# Patient Record
Sex: Male | Born: 1945 | Race: White | Hispanic: No | Marital: Single | Smoking: Former smoker
Health system: Southern US, Community
[De-identification: ages and names within clinical notes are randomized; demographics above are authoritative.]

## PROBLEM LIST (undated history)

## (undated) VITALS — BP 116/76 | HR 76 | Ht 73.0 in | Wt 260.0 lb

## (undated) DIAGNOSIS — I2510 Atherosclerotic heart disease of native coronary artery without angina pectoris: Secondary | ICD-10-CM

## (undated) DIAGNOSIS — I471 Supraventricular tachycardia, unspecified: Secondary | ICD-10-CM

## (undated) DIAGNOSIS — G4733 Obstructive sleep apnea (adult) (pediatric): Secondary | ICD-10-CM

## (undated) DIAGNOSIS — E119 Type 2 diabetes mellitus without complications: Secondary | ICD-10-CM

## (undated) DIAGNOSIS — I739 Peripheral vascular disease, unspecified: Secondary | ICD-10-CM

## (undated) DIAGNOSIS — C169 Malignant neoplasm of stomach, unspecified: Secondary | ICD-10-CM

## (undated) DIAGNOSIS — M109 Gout, unspecified: Secondary | ICD-10-CM

## (undated) DIAGNOSIS — Z95810 Presence of automatic (implantable) cardiac defibrillator: Secondary | ICD-10-CM

## (undated) DIAGNOSIS — G4730 Sleep apnea, unspecified: Secondary | ICD-10-CM

## (undated) DIAGNOSIS — I10 Essential (primary) hypertension: Secondary | ICD-10-CM

## (undated) DIAGNOSIS — T7840XA Allergy, unspecified, initial encounter: Secondary | ICD-10-CM

## (undated) DIAGNOSIS — Z8701 Personal history of pneumonia (recurrent): Secondary | ICD-10-CM

## (undated) DIAGNOSIS — J45909 Unspecified asthma, uncomplicated: Secondary | ICD-10-CM

## (undated) DIAGNOSIS — Z8719 Personal history of other diseases of the digestive system: Secondary | ICD-10-CM

## (undated) DIAGNOSIS — Z9289 Personal history of other medical treatment: Secondary | ICD-10-CM

## (undated) DIAGNOSIS — D649 Anemia, unspecified: Secondary | ICD-10-CM

## (undated) DIAGNOSIS — E669 Obesity, unspecified: Secondary | ICD-10-CM

## (undated) DIAGNOSIS — E785 Hyperlipidemia, unspecified: Secondary | ICD-10-CM

## (undated) DIAGNOSIS — IMO0001 Reserved for inherently not codable concepts without codable children: Secondary | ICD-10-CM

## (undated) DIAGNOSIS — F419 Anxiety disorder, unspecified: Secondary | ICD-10-CM

## (undated) DIAGNOSIS — Z9989 Dependence on other enabling machines and devices: Secondary | ICD-10-CM

## (undated) DIAGNOSIS — I472 Ventricular tachycardia, unspecified: Secondary | ICD-10-CM

## (undated) DIAGNOSIS — I4891 Unspecified atrial fibrillation: Secondary | ICD-10-CM

## (undated) DIAGNOSIS — Z5189 Encounter for other specified aftercare: Secondary | ICD-10-CM

## (undated) DIAGNOSIS — I509 Heart failure, unspecified: Secondary | ICD-10-CM

## (undated) DIAGNOSIS — K219 Gastro-esophageal reflux disease without esophagitis: Secondary | ICD-10-CM

## (undated) DIAGNOSIS — Z923 Personal history of irradiation: Secondary | ICD-10-CM

## (undated) HISTORY — DX: Essential (primary) hypertension: I10

## (undated) HISTORY — DX: Hyperlipidemia, unspecified: E78.5

## (undated) HISTORY — PX: TONSILLECTOMY: SUR1361

## (undated) HISTORY — DX: Ventricular tachycardia: I47.2

## (undated) HISTORY — PX: WRIST FRACTURE SURGERY: SHX121

## (undated) HISTORY — PX: CORONARY ARTERY BYPASS GRAFT: SHX141

## (undated) HISTORY — DX: Supraventricular tachycardia, unspecified: I47.10

## (undated) HISTORY — DX: Heart failure, unspecified: I50.9

## (undated) HISTORY — PX: CARDIAC CATHETERIZATION: SHX172

## (undated) HISTORY — PX: CATARACT EXTRACTION W/ INTRAOCULAR LENS IMPLANT: SHX1309

## (undated) HISTORY — DX: Ventricular tachycardia, unspecified: I47.20

## (undated) HISTORY — DX: Atherosclerotic heart disease of native coronary artery without angina pectoris: I25.10

## (undated) HISTORY — DX: Peripheral vascular disease, unspecified: I73.9

## (undated) HISTORY — PX: CARDIAC DEFIBRILLATOR PLACEMENT: SHX171

## (undated) HISTORY — DX: Obesity, unspecified: E66.9

## (undated) HISTORY — DX: Supraventricular tachycardia: I47.1

---

## 2000-12-19 ENCOUNTER — Ambulatory Visit (HOSPITAL_COMMUNITY): Admission: RE | Admit: 2000-12-19 | Discharge: 2000-12-19 | Payer: Self-pay | Admitting: Cardiology

## 2001-08-23 ENCOUNTER — Emergency Department (HOSPITAL_COMMUNITY): Admission: EM | Admit: 2001-08-23 | Discharge: 2001-08-23 | Payer: Self-pay | Admitting: Emergency Medicine

## 2001-08-23 ENCOUNTER — Encounter: Payer: Self-pay | Admitting: Cardiology

## 2002-11-15 ENCOUNTER — Encounter: Payer: Self-pay | Admitting: Internal Medicine

## 2002-11-15 ENCOUNTER — Ambulatory Visit (HOSPITAL_COMMUNITY): Admission: RE | Admit: 2002-11-15 | Discharge: 2002-11-15 | Payer: Self-pay | Admitting: Internal Medicine

## 2006-11-28 ENCOUNTER — Encounter (HOSPITAL_COMMUNITY): Admission: RE | Admit: 2006-11-28 | Discharge: 2007-02-26 | Payer: Self-pay | Admitting: Endocrinology

## 2006-12-08 ENCOUNTER — Ambulatory Visit (HOSPITAL_COMMUNITY): Admission: RE | Admit: 2006-12-08 | Discharge: 2006-12-08 | Payer: Self-pay | Admitting: Cardiology

## 2007-01-02 ENCOUNTER — Inpatient Hospital Stay (HOSPITAL_COMMUNITY): Admission: RE | Admit: 2007-01-02 | Discharge: 2007-01-07 | Payer: Self-pay | Admitting: Cardiothoracic Surgery

## 2007-01-02 ENCOUNTER — Ambulatory Visit: Payer: Self-pay | Admitting: Critical Care Medicine

## 2007-01-21 ENCOUNTER — Inpatient Hospital Stay (HOSPITAL_COMMUNITY): Admission: EM | Admit: 2007-01-21 | Discharge: 2007-01-24 | Payer: Self-pay | Admitting: Emergency Medicine

## 2007-01-23 ENCOUNTER — Encounter: Payer: Self-pay | Admitting: Cardiology

## 2007-02-09 ENCOUNTER — Ambulatory Visit: Payer: Self-pay | Admitting: Cardiothoracic Surgery

## 2007-02-27 ENCOUNTER — Ambulatory Visit: Payer: Self-pay | Admitting: Cardiothoracic Surgery

## 2007-02-27 ENCOUNTER — Encounter: Admission: RE | Admit: 2007-02-27 | Discharge: 2007-02-27 | Payer: Self-pay | Admitting: Cardiothoracic Surgery

## 2007-02-28 ENCOUNTER — Ambulatory Visit (HOSPITAL_COMMUNITY): Admission: RE | Admit: 2007-02-28 | Discharge: 2007-02-28 | Payer: Self-pay | Admitting: Cardiothoracic Surgery

## 2007-03-09 ENCOUNTER — Ambulatory Visit: Payer: Self-pay | Admitting: Cardiothoracic Surgery

## 2007-03-09 ENCOUNTER — Encounter: Admission: RE | Admit: 2007-03-09 | Discharge: 2007-03-09 | Payer: Self-pay | Admitting: Cardiothoracic Surgery

## 2007-03-30 ENCOUNTER — Encounter: Admission: RE | Admit: 2007-03-30 | Discharge: 2007-03-30 | Payer: Self-pay | Admitting: Cardiothoracic Surgery

## 2007-03-30 ENCOUNTER — Ambulatory Visit: Payer: Self-pay | Admitting: Cardiothoracic Surgery

## 2007-08-21 ENCOUNTER — Emergency Department (HOSPITAL_COMMUNITY): Admission: EM | Admit: 2007-08-21 | Discharge: 2007-08-22 | Payer: Self-pay | Admitting: Emergency Medicine

## 2007-09-14 ENCOUNTER — Encounter: Payer: Self-pay | Admitting: Pulmonary Disease

## 2007-11-22 ENCOUNTER — Ambulatory Visit (HOSPITAL_COMMUNITY): Admission: RE | Admit: 2007-11-22 | Discharge: 2007-11-22 | Payer: Self-pay | Admitting: Cardiology

## 2007-11-27 ENCOUNTER — Inpatient Hospital Stay (HOSPITAL_COMMUNITY): Admission: RE | Admit: 2007-11-27 | Discharge: 2007-11-28 | Payer: Self-pay | Admitting: Cardiology

## 2007-12-19 ENCOUNTER — Ambulatory Visit: Payer: Self-pay | Admitting: Pulmonary Disease

## 2007-12-19 DIAGNOSIS — J45909 Unspecified asthma, uncomplicated: Secondary | ICD-10-CM | POA: Insufficient documentation

## 2007-12-19 DIAGNOSIS — I10 Essential (primary) hypertension: Secondary | ICD-10-CM

## 2007-12-19 DIAGNOSIS — E119 Type 2 diabetes mellitus without complications: Secondary | ICD-10-CM | POA: Insufficient documentation

## 2007-12-19 DIAGNOSIS — R0602 Shortness of breath: Secondary | ICD-10-CM | POA: Insufficient documentation

## 2007-12-19 DIAGNOSIS — R0600 Dyspnea, unspecified: Secondary | ICD-10-CM

## 2007-12-19 DIAGNOSIS — E785 Hyperlipidemia, unspecified: Secondary | ICD-10-CM

## 2007-12-20 ENCOUNTER — Ambulatory Visit: Payer: Self-pay | Admitting: Pulmonary Disease

## 2007-12-25 ENCOUNTER — Ambulatory Visit: Payer: Self-pay | Admitting: Pulmonary Disease

## 2007-12-25 DIAGNOSIS — J438 Other emphysema: Secondary | ICD-10-CM | POA: Insufficient documentation

## 2008-01-01 ENCOUNTER — Ambulatory Visit (HOSPITAL_COMMUNITY): Admission: RE | Admit: 2008-01-01 | Discharge: 2008-01-01 | Payer: Self-pay | Admitting: Pulmonary Disease

## 2008-01-07 ENCOUNTER — Telehealth (INDEPENDENT_AMBULATORY_CARE_PROVIDER_SITE_OTHER): Payer: Self-pay | Admitting: *Deleted

## 2008-02-29 ENCOUNTER — Ambulatory Visit: Payer: Self-pay | Admitting: Pulmonary Disease

## 2008-03-28 ENCOUNTER — Ambulatory Visit: Payer: Self-pay | Admitting: Pulmonary Disease

## 2008-11-26 ENCOUNTER — Emergency Department (HOSPITAL_COMMUNITY): Admission: EM | Admit: 2008-11-26 | Discharge: 2008-11-26 | Payer: Self-pay | Admitting: Emergency Medicine

## 2010-07-21 ENCOUNTER — Ambulatory Visit: Payer: Self-pay | Admitting: Cardiology

## 2010-07-26 ENCOUNTER — Ambulatory Visit: Payer: Self-pay | Admitting: Cardiology

## 2010-10-12 ENCOUNTER — Ambulatory Visit: Payer: Self-pay | Admitting: Internal Medicine

## 2010-10-12 DIAGNOSIS — I472 Ventricular tachycardia: Secondary | ICD-10-CM

## 2010-10-14 ENCOUNTER — Ambulatory Visit: Payer: Self-pay | Admitting: Internal Medicine

## 2010-10-25 ENCOUNTER — Encounter: Payer: Self-pay | Admitting: Internal Medicine

## 2010-10-25 ENCOUNTER — Encounter (INDEPENDENT_AMBULATORY_CARE_PROVIDER_SITE_OTHER): Payer: Self-pay | Admitting: *Deleted

## 2010-10-27 ENCOUNTER — Ambulatory Visit: Payer: Self-pay | Admitting: Cardiology

## 2010-11-09 ENCOUNTER — Telehealth (INDEPENDENT_AMBULATORY_CARE_PROVIDER_SITE_OTHER): Payer: Self-pay | Admitting: *Deleted

## 2010-11-10 ENCOUNTER — Encounter (HOSPITAL_COMMUNITY)
Admission: RE | Admit: 2010-11-10 | Discharge: 2011-01-11 | Payer: Self-pay | Source: Home / Self Care | Attending: Cardiology | Admitting: Cardiology

## 2010-11-10 ENCOUNTER — Encounter: Payer: Self-pay | Admitting: Cardiology

## 2010-11-10 ENCOUNTER — Ambulatory Visit: Payer: Self-pay | Admitting: Cardiology

## 2010-11-10 ENCOUNTER — Ambulatory Visit: Payer: Self-pay

## 2010-11-10 ENCOUNTER — Ambulatory Visit (HOSPITAL_COMMUNITY)
Admission: RE | Admit: 2010-11-10 | Discharge: 2010-11-10 | Payer: Self-pay | Source: Home / Self Care | Admitting: Cardiology

## 2010-11-18 ENCOUNTER — Telehealth: Payer: Self-pay | Admitting: Internal Medicine

## 2010-12-03 ENCOUNTER — Ambulatory Visit: Payer: Self-pay | Admitting: Pulmonary Disease

## 2010-12-03 DIAGNOSIS — G4733 Obstructive sleep apnea (adult) (pediatric): Secondary | ICD-10-CM

## 2010-12-27 ENCOUNTER — Ambulatory Visit: Payer: Self-pay | Admitting: Cardiology

## 2010-12-28 ENCOUNTER — Ambulatory Visit (HOSPITAL_BASED_OUTPATIENT_CLINIC_OR_DEPARTMENT_OTHER)
Admission: RE | Admit: 2010-12-28 | Discharge: 2010-12-28 | Payer: Self-pay | Source: Home / Self Care | Attending: Pulmonary Disease | Admitting: Pulmonary Disease

## 2010-12-28 ENCOUNTER — Encounter: Payer: Self-pay | Admitting: Pulmonary Disease

## 2011-01-02 ENCOUNTER — Encounter: Payer: Self-pay | Admitting: Cardiothoracic Surgery

## 2011-01-11 DIAGNOSIS — G4733 Obstructive sleep apnea (adult) (pediatric): Secondary | ICD-10-CM

## 2011-01-13 ENCOUNTER — Ambulatory Visit: Admit: 2011-01-13 | Payer: Self-pay | Admitting: Internal Medicine

## 2011-01-13 ENCOUNTER — Encounter (INDEPENDENT_AMBULATORY_CARE_PROVIDER_SITE_OTHER): Payer: Medicare Other

## 2011-01-13 ENCOUNTER — Encounter: Payer: Self-pay | Admitting: Internal Medicine

## 2011-01-13 DIAGNOSIS — I428 Other cardiomyopathies: Secondary | ICD-10-CM

## 2011-01-13 NOTE — Assessment & Plan Note (Signed)
Summary: Cardiology Nuclear Testing  Nuclear Med Background Indications for Stress Test: Evaluation for Ischemia, Graft Patency, PTCA Patency   History: Angioplasty, Asthma, CABG, COPD, Defibrillator, Echo, Emphysema, Heart Catheterization, Myocardial Perfusion Study  History Comments: '97 CABG with re-do 2007 after abnormal ZOX:WRUEAV scar with peri-infarct ischemia; '08 PTCA-LIMA-LAD; h/o SVT  Symptoms: Chest Pain, Diaphoresis, DOE, Fatigue, Light-Headedness  Symptoms Comments: Hx of SVT   Nuclear Pre-Procedure Cardiac Risk Factors: History of Smoking, Hypertension, IDDM Type 1, Lipids, Obesity Caffeine/Decaff Intake: None NPO After: 6:00 PM Lungs: Audible wheezes with walking, clear with rest.  O2 Sat 100% on RA IV 0.9% NS with Angio Cath: 22g     IV Site: R Hand IV Started by: Bonnita Levan, RN Chest Size (in) 50     Height (in): 73 Weight (lb): 256 BMI: 33.90  Nuclear Med Study 1 or 2 day study:  1 day     Stress Test Type:  Eugenie Birks Reading MD:  Marca Ancona, MD     Referring MD:  Peter [place], MD Resting Radionuclide:  Technetium 39m Tetrofosmin     Resting Radionuclide Dose:  11 mCi  Stress Radionuclide:  Technetium 1m Tetrofosmin     Stress Radionuclide Dose:  33 mCi   Stress Protocol   Lexiscan: 0.4 mg   Stress Test Technologist:  Rea College, CMA-N     Nuclear Technologist:  Domenic Polite, CNMT  Rest Procedure  Myocardial perfusion imaging was performed at rest 45 minutes following the intravenous administration of Technetium 66m Tetrofosmin.  Stress Procedure  The patient received IV Lexiscan 0.4 mg over 15-seconds.  Technetium 9m Tetrofosmin injected at 30-seconds.  There were no significant changes with infusion, other than occasional PVC's.  Quantitative spect images were obtained after a 45 minute delay.  QPS Raw Data Images:  Normal; no motion artifact; normal heart/lung ratio. Stress Images:  Inferolateral perfusion defect extending to the apex.    Rest Images:  Inferolateral perfusion defect extending to the apex.  Subtraction (SDS):  Primarily fixed inferolateral perfusion defect extending to the apex with slight reversibility.  Transient Ischemic Dilatation:  1.10  (Normal <1.22)  Lung/Heart Ratio:  0.41  (Normal <0.45)  Quantitative Gated Spect Images QGS EDV:  169 ml QGS ESV:  97 ml QGS EF:  42 % QGS cine images:  Inferolateral hypokinesis.    Overall Impression  Exercise Capacity: Lexiscan with no exercise. BP Response: Normal blood pressure response. Clinical Symptoms: Short of breath.  ECG Impression: No significant ST segment change suggestive of ischemia. Overall Impression: Primarily fixed inferolateral defect extending to the apex.  Defect is most profound at the apex.  Overall Impression Comments: Suggestive of prior infarct with some mild peri-infarct ischemia.  Mild systolic dysfunction with inferolateral hypokinesis.   Appended Document: Cardiology Nuclear Testing copy sent to Dr. Swaz[place]and   Appended Document: Cardiology Nuclear Testing this appears to be new from 2007 in terms of inferolaeral defect;  the lvEF is much improved   Appended Document: Cardiology Nuclear Testing spoke with Dr [place]; he will follow up with patient  steve  Appended Document: Cardiology Nuclear Testing pt aware of info. Claris Gladden, RN, BSN

## 2011-01-13 NOTE — Assessment & Plan Note (Signed)
Summary: FATIGUE/ SLEEP DISORDER ///KP   Visit Type:  Initial Consult Copy to:  Dr. Sherryl Manges Primary Provider/Referring Provider:  Dr. Carlis Stable in [facility]  CC:  Sleep consult...epworth score is 6..  History of Present Illness: 65 yo male for sleep evaluation.  He has a history of heart disease, diabetes, and heart failure.  He was recently seen by cardiology, and there was concern that he could have sleep disordered breathing contributing to his health problems.  Sleep consultation was therefore requested.  He lives alone.  He has been told he snores.  He goes to bed at 11pm, and falls asleep quickly.  He wakes upt to use the bathroom 2 or 3 times.  He gets out of bed at 8am.  He feels okay in the morning, and denies headaches.  He does not use anything to help him sleep.  He drinks diet pepsi all day.  He will start to doze off while watching TV.  He occasionally takes naps during the day.  He denies sleep walking, sleep talking, nightmares, or bruxism.  There is no history of restless legs.  He denies sleep hallucinations, sleep paralysis, or cataplexy.  His weight has been steady.  There is no history of thyroid disease.  He had his tonsills out as a child.  He quit smoking in 1997.  He denies alcohol use.  There is no history of depression.  He has never had a sleep test before.  Preventive Screening-Counseling & Management  Alcohol-Tobacco     Alcohol drinks/day: 0     Smoking Status: quit     Packs/Day: 1.0     Year Started: 1967     Year Quit: 1997     Pack years: 30  Current Medications (verified): 1)  Fish Oil 1200 Mg  Caps (Omega-3 Fatty Acids) .... Take 4 Tablet By Mouth Once A Day 2)  Bayer Low Strength 81 Mg  Tbec (Aspirin) .... Take 1 Tablet By Mouth Once A Day 3)  Albuterol 90 Mcg/act  Aers (Albuterol) .... Inhale 2 Puffs Every Four Hours As Needed 4)  Potassium .... (Unknown Dosage) Take 1 Tablet By Mouth Once A Day 5)  Lipitor 40 Mg  Tabs (Atorvastatin  Calcium) .... Take 1 Tablet By Mouth Once A Day 6)  Metoprolol Succinate 100 Mg  Tb24 (Metoprolol Succinate) .... Take 1 Tablet By Mouth Once A Day 7)  Benicar 20 Mg  Tabs (Olmesartan Medoxomil) .... Take 1 Tablet By Mouth Once A Day 8)  Allopurinol 300 Mg Tabs (Allopurinol) .... Once Daily 9)  Ranitidine Hcl 300 Mg Caps (Ranitidine Hcl) .... Once Daily 10)  Januvia 100 Mg Tabs (Sitagliptin Phosphate) .... Once Daily 11)  Crestor 20 Mg Tabs (Rosuvastatin Calcium) .... Take One Tablet By Mouth Daily. 12)  Fenofibrate 160 Mg Tabs (Fenofibrate) .... Take One Tablet By Mouth Daily With A Meal 13)  Glucotrol 10 Mg Tabs (Glipizide) .... Once Daily 14)  Levemir Flexpen 100 Unit/ml Soln (Insulin Detemir) .... As Directed  Allergies (verified): No Known Drug Allergies  Past History:  Past Medical History: Last updated: 12/19/2007 Asthma Hyperlipidemia Hypertension Coronary Heart Disease s/p recent stent 12/08 ischemic cm with chronic chf h/o svt/flutter h/o vt with icd placement Diabetes, Type 2  Past Surgical History: C A B G: times two with last 1/08 Tonsillectomy  Family History: heart disease: brother cancer: brother( unsure what type), mother (breast)  Social History: Patient states former smoker.  pt is single. he has worked  as a Advice worker, now retired Alcohol drinks/day:  0 Packs/Day:  1.0 Pack years:  30  Review of Systems       The patient complains of anxiety.  The patient denies shortness of breath with activity, shortness of breath at rest, productive cough, non-productive cough, coughing up blood, chest pain, irregular heartbeats, acid heartburn, indigestion, loss of appetite, weight change, abdominal pain, difficulty swallowing, sore throat, tooth/dental problems, headaches, nasal congestion/difficulty breathing through nose, sneezing, itching, ear ache, depression, hand/feet swelling, joint stiffness or pain, rash, change in color of mucus, and fever.     Vital Signs:  Patient profile:   65 year old male Height:      73 inches (185.42 cm) Weight:      260 pounds (118.18 kg) BMI:     34.43 O2 Sat:      97 % on Room air Temp:     98.0 degrees F (36.67 degrees C) oral Pulse rate:   76 / minute BP sitting:   116 / 76  (left arm) Cuff size:   large  Vitals Entered By: Michel Bickers CMA (December 03, 2010 1:24 PM)  O2 Sat at Rest %:  97 O2 Flow:  Room air CC: Sleep consult...epworth score is 6. Is Patient Diabetic? Yes Comments Medications reviewed with patient Michel Bickers [facility]  December 03, 2010 1:24 PM   Physical Exam  General:  normal appearance, healthy appearing, and obese.   Eyes:  pupils equalround reactive to light and accommodation, however he does have lateral deviation of his right eye.  Nose:  no deformity, discharge, inflammation, or lesions Mouth:  MP 3, no exudate Neck:  no JVD, thyromegaly, or lymphadenopathy. Lungs:  clear bilaterally to auscultation and percussion Heart:  regular rhythm, normal rate, and no murmurs.   Abdomen:  bowel sounds positive; abdomen soft and non-tender without masses, or organomegaly Pulses:  pulses normal Extremities:  no clubbing, cyanosis, edema, or deformity noted Neurologic:  normal CN II-XII and strength normal.   Cervical Nodes:  no significant adenopathy Psych:  alert and cooperative; normal mood and affect; normal attention span and concentration   Impression & Recommendations:  Problem # 1:  HYPERSOMNIA (ICD-780.54) He has snoring, sleep disruption, and daytimes sleepiness.  He has a history of CAD, heart failure, arrhythmia, and diabetes.  I am concerned he could have sleep apnea.  To further assess this will arrange for in-lab sleep test.  Explained how sleep apnea can affect his health.  Driving precautions and importance of weight loss were discussed.  Complete Medication List: 1)  Fish Oil 1200 Mg Caps (Omega-3 fatty acids) .... Take 4 tablet by mouth once a day 2)   Bayer Low Strength 81 Mg Tbec (Aspirin) .... Take 1 tablet by mouth once a day 3)  Albuterol 90 Mcg/act Aers (Albuterol) .... Inhale 2 puffs every four hours as needed 4)  Potassium  .... (unknown dosage) take 1 tablet by mouth once a day 5)  Lipitor 40 Mg Tabs (Atorvastatin calcium) .... Take 1 tablet by mouth once a day 6)  Metoprolol Succinate 100 Mg Tb24 (Metoprolol succinate) .... Take 1 tablet by mouth once a day 7)  Benicar 20 Mg Tabs (Olmesartan medoxomil) .... Take 1 tablet by mouth once a day 8)  Allopurinol 300 Mg Tabs (Allopurinol) .... Once daily 9)  Ranitidine Hcl 300 Mg Caps (Ranitidine hcl) .... Once daily 10)  Januvia 100 Mg Tabs (Sitagliptin phosphate) .... Once daily 11)  Crestor 20 Mg Tabs (  Rosuvastatin calcium) .... Take one tablet by mouth daily. 12)  Fenofibrate 160 Mg Tabs (Fenofibrate) .... Take one tablet by mouth daily with a meal 13)  Glucotrol 10 Mg Tabs (Glipizide) .... Once daily 14)  Levemir Flexpen 100 Unit/ml Soln (Insulin detemir) .... As directed  Other Orders: Consultation Level IV (52841) Sleep Disorder Referral (Sleep Disorder)  Patient Instructions: 1)  Will schedule sleep test 2)  Will call to schedule follow up after sleep test reviewed

## 2011-01-13 NOTE — Assessment & Plan Note (Signed)
Summary: device ck-battery low/mt   Visit Type:  [employer] Referring Provider:  [place]  CC:  shortness of breath, dizziness, and chest pain.  History of Present Illness: [name] is seen in followup for polymorphic ventricular tachycardia and ischemic heart disease with prior bypass surgery and subsequent inducible ventricular tachycardia following revascularization. He is status post ICD implantation.  He underwent redo CABG in 2007 and then subsequent angioplasty of the IMA anastomosis to the LAD in 2008. Ejection fraction at that time was 50%.  He has chronic modest dyspnea on exertion. He has chronic stable fatigue with sleep disordered breathing and daytime somnolence. He also has atypical sharp chest pains that are related to stress and mildly to exertion but not reproducibly so.  Problems Prior to Update: 1)  Emphysema  (ICD-492.8) 2)  Diabetes, Type 2  (ICD-250.00) 3)  Coronary Heart Disease  (ICD-414.00) 4)  Dyspnea  (ICD-786.05) 5)  Dm  (ICD-250.00) 6)  Heart Failure  (ICD-428.9) 7)  Hypertension  (ICD-401.9) 8)  Hyperlipidemia  (ICD-272.4) 9)  Asthma  (ICD-493.90)  Current Medications (verified): 1)  Fish Oil 1200 Mg  Caps (Omega-3 Fatty Acids) .... Take 4 Tablet By Mouth Once A Day 2)  Bayer Low Strength 81 Mg  Tbec (Aspirin) .... Take 1 Tablet By Mouth Once A Day 3)  Albuterol 90 Mcg/act  Aers (Albuterol) .... Inhale 2 Puffs Every Four Hours As Needed 4)  Potassium .... (Unknown Dosage) Take 1 Tablet By Mouth Once A Day 5)  Lipitor 40 Mg  Tabs (Atorvastatin Calcium) .... Take 1 Tablet By Mouth Once A Day 6)  Metoprolol Succinate 100 Mg  Tb24 (Metoprolol Succinate) .... Take 1 Tablet By Mouth Once A Day 7)  Benicar 20 Mg  Tabs (Olmesartan Medoxomil) .... Take 1 Tablet By Mouth Once A Day 8)  Allopurinol 300 Mg Tabs (Allopurinol) .... Once Daily 9)  Ranitidine Hcl 300 Mg Caps (Ranitidine Hcl) .... Once Daily 10)  Januvia 100 Mg Tabs (Sitagliptin Phosphate) .... Once  Daily 11)  Crestor 20 Mg Tabs (Rosuvastatin Calcium) .... Take One Tablet By Mouth Daily. 12)  Fenofibrate 160 Mg Tabs (Fenofibrate) .... Take One Tablet By Mouth Daily With A Meal 13)  Glucotrol 10 Mg Tabs (Glipizide) .... Once Daily 14)  Levemir Flexpen 100 Unit/ml Soln (Insulin Detemir) .... As Directed  Allergies (verified): No Known Drug Allergies  Past History:  Past Medical History: Last updated: 12/19/2007 Asthma Hyperlipidemia Hypertension Coronary Heart Disease s/p recent stent 12/08 ischemic cm with chronic chf h/o svt/flutter h/o vt with icd placement Diabetes, Type 2  Past Surgical History: Last updated: 12/19/2007 C A B G: times two with last 1/08  Family History: Last updated: 12/19/2007 heart disease: brother cancer: brother( unsure what type), mother (breast)  Social History: Last updated: 12/19/2007 Patient states former smoker.  pt is single. he has worked as a Advice worker  Risk Factors: Smoking Status: quit (12/19/2007)  Vital Signs:  Patient profile:   65 year old male Height:      73 inches Weight:      256 pounds BMI:     33.90 Pulse rate:   91 / minute BP sitting:   118 / 75  (left arm) Cuff size:   regular  Vitals Entered By: Caralee Ates CMA (October 12, 2010 12:27 PM)  Physical Exam  General:  The patient was alert and oriented in no acute distress. somewhat plethoric facies HEENT Normal.  Neck veins were flat, carotids were brisk.  Lungs were clear.  Heart sounds were regular without murmurs or gallops.  Abdomen was soft with active bowel sounds. There is no clubbing cyanosis or edema. Skin Warm and dry    EKG  Procedure date:  10/12/2010  Findings:      sinus rhythm at 89 Intervals 0.22/0.10/0.37 Axis is 21 Poor R wave progression   ICD Specifications Following MD:  Sherryl Manges, MD     Referring MD:  Vonna Drafts ICD Vendor:  [employer]     ICD Model Number:  7232     ICD Serial Number:  [id number] ICD  DOI:  11/15/2002     ICD Implanting MD:  Charlynn Court  Lead 1:    Location: RV     DOI: 04/12/1996     Model #: [id number]     Serial #: [id number]     Status: active  ICD Follow Up Remote Check?  No Battery Voltage:  2.65 V     Charge Time:  10.76 seconds     Underlying rhythm:  SR   ICD Device Measurements Right Ventricle:  Amplitude: 17.2 mV, Impedance: 432 ohms, Threshold: 1.0 V at 0.3 msec Shock Impedance: 61 ohms   Episodes Coumadin:  No Shock:  0     ATP:  0     Nonsustained:  0     Ventricular Pacing:  <0.1%  Brady Parameters Mode VVI     Lower Rate Limit:  40      Tachy Zones VF:  214     VT1:  167     Next Remote Date:  01/13/2011     Tech Comments:  No parameter changes.  Device function normal.  Battery near ERI.  Alert tones demonstrated.  Carelink transmissions every 3 months.  ROV 1 year with Dr. Graciela Husbands. Altha Harm, LPN  October 12, 2010 12:21 PM n  Impression & Recommendations:  Problem # 1:  FATIGUE-SLEEP-DISORDERED BREATHING (ICD-780.79)  the patient has significant daytime fatigue and nocturnal sleep disordered breathing. We'll arrange for a night watch study  Problem # 2:  IMPLANTABLE  DEFIBRILLATOR -MDT (ICD-V45.02) Device parameters and data were reviewed and no changes were made Device is approaching ERI with a battery voltage of 2.65. He is followed by care like  Problem # 3:  VENTRICULAR TACHYCARDIA (ICD-427.1) no intercurrent ventricular tachycardia His updated medication list for this problem includes:    Bayer Low Strength 81 Mg Tbec (Aspirin) .[name]... Take 1 tablet by mouth once a day    Metoprolol Succinate 100 Mg Tb24 (Metoprolol succinate) .[name]... Take 1 tablet by mouth once a day  Problem # 4:  HEART FAILURE (ICD-428.9) He has modest combined congestive heart failure. we'll continue him on his current medications.  Problem # 5:  CORONARY HEART DISEASE S/P CABG AND REDO (ICD-414.00) stable on current medications His updated medication list for  this problem includes:    Bayer Low Strength 81 Mg Tbec (Aspirin) .[name]... Take 1 tablet by mouth once a day    Metoprolol Succinate 100 Mg Tb24 (Metoprolol succinate) .[name]... Take 1 tablet by mouth once a day  Other Orders: Misc. Referral (Misc. Ref)  Patient Instructions: 1)  Your physician has recommended that you wear a holter monitor-Night Watch.  Holter monitors are medical devices that record the heart's electrical activity. Doctors most often use these monitors to diagnose arrhythmias. Arrhythmias are problems with the speed or rhythm of the heartbeat. The monitor is a small, portable device. You can wear one while  you do your normal daily activities. This is usually used to diagnose what is causing palpitations/syncope (passing out). 2)  Your physician recommends that you continue on your current medications as directed. Please refer to the Current Medication list given to you today.

## 2011-01-13 NOTE — Progress Notes (Signed)
  Phone Note Outgoing Call   Call placed by: rhonda Call placed to: Patient Details for Reason: nightwatch study Summary of Call: adv pt that we will be referring him to Dr. Vassie Loll.  He expressed understanding.  Initial call taken by: Claris Gladden RN,  November 18, 2010 6:17 PM

## 2011-01-13 NOTE — Progress Notes (Signed)
Summary: nuc pre procedure  Phone Note Outgoing Call Call back at Home Phone [phone]   Call placed by: Cathlyn Parsons RN,  November 09, 2010 2:52 PM Call placed to: Patient Reason for Call: Confirm/change Appt Summary of Call: Reviewed information on [employer] Information Sheet (see scanned document for further details).  Spoke with patient.      Nuclear Med Background Indications for Stress Test: Evaluation for Ischemia, Graft Patency, PTCA Patency   History: Angioplasty, CABG, COPD, Defibrillator, Echo, Heart Catheterization, Myocardial Perfusion Study  History Comments: 07 Redo CABG and MPS with apical infar and EF=24% 08 PTCA of LAD and Echo and EF=35-40%  Symptoms: Chest Pain, DOE, Light-Headedness  Symptoms Comments: Hx of SVT   Nuclear Pre-Procedure Cardiac Risk Factors: Hypertension, IDDM Type 1, Lipids, Obesity Height (in): 73

## 2011-01-13 NOTE — Procedures (Signed)
Summary: nite watch  nite watch   Imported By: Mirna Mires 11/22/2010 16:20:59  _____________________________________________________________________  External Attachment:    Type:   Image     Comment:   External Document

## 2011-01-19 NOTE — Miscellaneous (Signed)
Summary: Sleep study report  Clinical Lists Changes AHI 13.3, RDI 26.3.  Positional and REM effect.  PLMI 0.  Frequent PVC's.  Will have my nurse schedule ROV to discuss results.  Appended Document: Sleep study report pt is scheduled to come in on 2/13 at 1:30 to discuss results

## 2011-01-21 ENCOUNTER — Encounter: Payer: Self-pay | Admitting: Pulmonary Disease

## 2011-01-21 ENCOUNTER — Ambulatory Visit (INDEPENDENT_AMBULATORY_CARE_PROVIDER_SITE_OTHER): Payer: Medicare Other | Admitting: Pulmonary Disease

## 2011-01-21 DIAGNOSIS — G4733 Obstructive sleep apnea (adult) (pediatric): Secondary | ICD-10-CM

## 2011-01-24 ENCOUNTER — Ambulatory Visit: Payer: Medicare Other | Admitting: Pulmonary Disease

## 2011-01-27 NOTE — Assessment & Plan Note (Addendum)
Summary: discuss sleep results/ms   Copy to:  Dr. Sherryl Manges Primary Provider/Referring Provider:  Dr. Carlis Stable in [facility]  CC:  pt here to discuss sleep study results..  History of Present Illness: 65 yo male with OSA.  Sleep test done on Dec 28, 2010: AHI 13.3, RDI 26.3.  Positional and REM effect.  PLMI 0.  Frequent PVC's.  He continues to have sleep disturbance and daytime sleepiness.    Current Medications (verified): 1)  Fish Oil 1200 Mg  Caps (Omega-3 Fatty Acids) .... Take 4 Tablet By Mouth Once A Day 2)  Bayer Low Strength 81 Mg  Tbec (Aspirin) .... Take 1 Tablet By Mouth Once A Day 3)  Albuterol 90 Mcg/act  Aers (Albuterol) .... Inhale 2 Puffs Every Four Hours As Needed 4)  Potassium .... (Unknown Dosage) Take 1 Tablet By Mouth Once A Day 5)  Lipitor 40 Mg  Tabs (Atorvastatin Calcium) .... Take 1 Tablet By Mouth Once A Day 6)  Metoprolol Succinate 100 Mg  Tb24 (Metoprolol Succinate) .... Take 1 Tablet By Mouth Once A Day 7)  Benicar 20 Mg  Tabs (Olmesartan Medoxomil) .... Take 1 Tablet By Mouth Once A Day 8)  Allopurinol 300 Mg Tabs (Allopurinol) .... Once Daily 9)  Ranitidine Hcl 300 Mg Caps (Ranitidine Hcl) .... Once Daily 10)  Januvia 100 Mg Tabs (Sitagliptin Phosphate) .... Once Daily 11)  Crestor 20 Mg Tabs (Rosuvastatin Calcium) .... Take One Tablet By Mouth Daily. 12)  Fenofibrate 160 Mg Tabs (Fenofibrate) .... Take One Tablet By Mouth Daily With A Meal 13)  Glucotrol 10 Mg Tabs (Glipizide) .... Once Daily 14)  Levemir Flexpen 100 Unit/ml Soln (Insulin Detemir) .... As Directed  Allergies (verified): No Known Drug Allergies  Past History:  Past Medical History: Asthma Hyperlipidemia Hypertension Coronary Heart Disease s/p recent stent 12/08 ischemic cm with chronic chf h/o svt/flutter h/o vt with icd placement Diabetes, Type 2 OSA      - PSG 12/28/10 AHI 13  Past Surgical History: Reviewed history from 12/03/2010 and no changes required. C  A B G: times two with last 1/08 Tonsillectomy  Family History: Reviewed history from 12/03/2010 and no changes required. heart disease: brother cancer: brother( unsure what type), mother (breast)  Social History: Patient states former smoker.  quit 1997. 2 ppd. started age 14.  pt is single.  He has worked as a Advice worker, now retired.  Vital Signs:  Patient profile:   65 year old male Height:      73 inches Weight:      261.38 pounds BMI:     34.61 O2 Sat:      96 % on Room air Temp:     97.4 degrees F oral Pulse rate:   60 / minute BP sitting:   130 / 76  (left arm) Cuff size:   large  Vitals Entered By: Carver Fila (January 21, 2011 9:28 AM)  O2 Flow:  Room air CC: pt here to discuss sleep study results. Comments meds and allergies updated Phone number updated Carver Fila  January 21, 2011 9:28 AM    Physical Exam  General:  normal appearance, healthy appearing, and obese.   Nose:  no deformity, discharge, inflammation, or lesions Mouth:  MP 3, no exudate Neck:  no JVD, thyromegaly, or lymphadenopathy. Lungs:  clear bilaterally to auscultation and percussion Heart:  regular rhythm, normal rate, and no murmurs.   Extremities:  no clubbing, cyanosis, edema, or deformity noted Neurologic:  normal CN II-XII and strength normal.   Cervical Nodes:  no significant adenopathy Psych:  alert and cooperative; normal mood and affect; normal attention span and concentration   Impression & Recommendations:  Problem # 1:  OBSTRUCTIVE SLEEP APNEA (ICD-327.23) He has mild to moderate sleep apnea.  I reviewed his sleep study with him.  Need for weight loss, and driving precautions reviewed.  Treatment options were reviewed.  He would not be a candidate for oral appliance due to his prior dental problems.    Will proceed with CPAP titration study, and then start him on CPAP.  Will arrange for follow up after he is established on CPAP.  Complete Medication List: 1)  Fish  Oil 1200 Mg Caps (Omega-3 fatty acids) .... Take 4 tablet by mouth once a day 2)  Bayer Low Strength 81 Mg Tbec (Aspirin) .... Take 1 tablet by mouth once a day 3)  Albuterol 90 Mcg/act Aers (Albuterol) .... Inhale 2 puffs every four hours as needed 4)  Potassium  .... (unknown dosage) take 1 tablet by mouth once a day 5)  Lipitor 40 Mg Tabs (Atorvastatin calcium) .... Take 1 tablet by mouth once a day 6)  Metoprolol Succinate 100 Mg Tb24 (Metoprolol succinate) .... Take 1 tablet by mouth once a day 7)  Benicar 20 Mg Tabs (Olmesartan medoxomil) .... Take 1 tablet by mouth once a day 8)  Allopurinol 300 Mg Tabs (Allopurinol) .... Once daily 9)  Ranitidine Hcl 300 Mg Caps (Ranitidine hcl) .... Once daily 10)  Januvia 100 Mg Tabs (Sitagliptin phosphate) .... Once daily 11)  Crestor 20 Mg Tabs (Rosuvastatin calcium) .... Take one tablet by mouth daily. 12)  Fenofibrate 160 Mg Tabs (Fenofibrate) .... Take one tablet by mouth daily with a meal 13)  Glucotrol 10 Mg Tabs (Glipizide) .... Once daily 14)  Levemir Flexpen 100 Unit/ml Soln (Insulin detemir) .... As directed  Other Orders: Est. Patient Level III (04540) Sleep Study (Sleep Study)  Patient Instructions: 1)  Will arrange for CPAP sleep study 2)  Will call to arrange follow up after sleep study reviewed   Immunization History:  Influenza Immunization History:    Influenza:  historical (08/12/2010)  Pneumovax Immunization History:    Pneumovax:  historical (08/12/2010)

## 2011-01-31 ENCOUNTER — Encounter (INDEPENDENT_AMBULATORY_CARE_PROVIDER_SITE_OTHER): Payer: Self-pay | Admitting: *Deleted

## 2011-02-08 NOTE — Letter (Signed)
Summary: Remote Device Check  [facility], Main Office  [address]   [place], [place] [address]   Phone: [phone]  Fax: [phone]     January 31, 2011 MRN: [id number]   [name] [address] [place], [place]  [address]   Dear [name],   Your remote transmission was recieved and reviewed by your physician.  All diagnostics were within normal limits for you.  __X___Your next transmission is scheduled for:   02-17-2011.  Please transmit at any time this day.  If you have a wireless device your transmission will be sent automatically.   Sincerely,  Vella Kohler

## 2011-02-08 NOTE — Cardiovascular Report (Signed)
Summary: Office Visit Remote   Office Visit Remote   Imported By: Roderic Ovens 02/04/2011 11:42:08  _____________________________________________________________________  External Attachment:    Type:   Image     Comment:   External Document

## 2011-02-15 ENCOUNTER — Ambulatory Visit (HOSPITAL_BASED_OUTPATIENT_CLINIC_OR_DEPARTMENT_OTHER): Payer: Medicare Other | Attending: Pulmonary Disease

## 2011-02-15 ENCOUNTER — Encounter: Payer: Self-pay | Admitting: Pulmonary Disease

## 2011-02-15 DIAGNOSIS — I509 Heart failure, unspecified: Secondary | ICD-10-CM | POA: Insufficient documentation

## 2011-02-15 DIAGNOSIS — G4733 Obstructive sleep apnea (adult) (pediatric): Secondary | ICD-10-CM | POA: Insufficient documentation

## 2011-02-15 DIAGNOSIS — I2510 Atherosclerotic heart disease of native coronary artery without angina pectoris: Secondary | ICD-10-CM | POA: Insufficient documentation

## 2011-02-15 DIAGNOSIS — I4949 Other premature depolarization: Secondary | ICD-10-CM | POA: Insufficient documentation

## 2011-02-15 DIAGNOSIS — E119 Type 2 diabetes mellitus without complications: Secondary | ICD-10-CM | POA: Insufficient documentation

## 2011-02-15 DIAGNOSIS — Z79899 Other long term (current) drug therapy: Secondary | ICD-10-CM | POA: Insufficient documentation

## 2011-02-17 DIAGNOSIS — I428 Other cardiomyopathies: Secondary | ICD-10-CM

## 2011-02-18 ENCOUNTER — Encounter: Payer: Self-pay | Admitting: Internal Medicine

## 2011-02-18 DIAGNOSIS — I428 Other cardiomyopathies: Secondary | ICD-10-CM

## 2011-02-24 ENCOUNTER — Telehealth (INDEPENDENT_AMBULATORY_CARE_PROVIDER_SITE_OTHER): Payer: Self-pay | Admitting: *Deleted

## 2011-02-24 ENCOUNTER — Encounter: Payer: Self-pay | Admitting: *Deleted

## 2011-02-24 DIAGNOSIS — G4733 Obstructive sleep apnea (adult) (pediatric): Secondary | ICD-10-CM

## 2011-02-24 DIAGNOSIS — I2510 Atherosclerotic heart disease of native coronary artery without angina pectoris: Secondary | ICD-10-CM

## 2011-02-24 DIAGNOSIS — I4949 Other premature depolarization: Secondary | ICD-10-CM

## 2011-03-01 NOTE — Letter (Signed)
Summary: Remote Device Check  [facility], Main Office  [address]   [place], [place] [address]   Phone: [phone]  Fax: [phone]     February 24, 2011 MRN: [id number]   ALGER KERSTEIN [address] [place], [place]  [address]   Dear Mr. Werst,   Your remote transmission was recieved and reviewed by your physician.  All diagnostics were within normal limits for you.  __X___Your next transmission is scheduled for:   03/24/2011.  Please transmit at any time this day.  If you have a wireless device your transmission will be sent automatically.  ______Your next office visit is scheduled for:                              . Please call our office to schedule an appointment.    Sincerely,  Altha Harm, LPN

## 2011-03-01 NOTE — Miscellaneous (Addendum)
Summary: CPAP titration  Clinical Lists Changes CPAP 9 cm H2O seemed optimal.  Observed in REM and supine sleep.  Had central apneas, and sleep disruption with higher pressures.  Attempted to contact pt by phone, and left message on voicemail detailing plan.  Will set up CPAP 9 cm H2O and have my nurse contact him to schedule ROV 2 months after CPAP set up. Orders: Added new Referral order of DME Referral (DME) - Signed  Appended Document: CPAP titration see phone note 02/24/11

## 2011-03-01 NOTE — Progress Notes (Signed)
Summary: returning a call from Dr Craige Cotta  Phone Note Call from Patient Call back at [facility] Phone [phone]   Caller: Patient Call For: sood Summary of Call: patient phoned stated that Dr Craige Cotta called him and left a message. he can be reached at [phone]. Initial call taken by: Vedia Coffer,  February 24, 2011 2:58 PM  Follow-up for Phone Call        Clinical Lists Changes CPAP 9 cm H2O seemed optimal.  Observed in REM and supine sleep.  Had central apneas, and sleep disruption with higher pressures. per clinical list update on 02/15/2011.... Attempted to contact pt by phone, and left message on voicemail detailing plan.  Will set up CPAP 9 cm H2O and have my nurse contact him to schedule ROV 2 months after CPAP set up.  I called and spoke with pt and informed him of the above information.  pt verbalized his understanding.  [facility]' schedule is not out yet for May.  I instructed pt to call back in early April to set his appt up for end May when he will have been on his cpap machine for 2 months. Pt verbalized his understanding.  Marland KitchenArman Filter LPN  February 24, 2011 3:06 PM    Additional Follow-up for Phone Call Additional follow up Details #1::        [facility], [facility] sent order on 02/15/2011 for pt to get set up with a cpap machine.  Ok to go ahead and send order to DME company. Arman Filter LPN  February 24, 2011 3:07 PM     Additional Follow-up for Phone Call Additional follow up Details #2::    order faxed to [facility] to start cpap Oneita Jolly  February 24, 2011 3:42 PM  Follow-up by: Oneita Jolly,  February 24, 2011 3:43 PM

## 2011-03-10 NOTE — Cardiovascular Report (Signed)
Summary: Office Visit   Office Visit   Imported By: Roderic Ovens 03/01/2011 09:11:40  _____________________________________________________________________  External Attachment:    Type:   Image     Comment:   External Document

## 2011-03-11 ENCOUNTER — Other Ambulatory Visit: Payer: Self-pay | Admitting: *Deleted

## 2011-03-11 DIAGNOSIS — I509 Heart failure, unspecified: Secondary | ICD-10-CM

## 2011-03-11 MED ORDER — FUROSEMIDE 40 MG PO TABS: 40.0000 mg | ORAL_TABLET | Freq: Every day | ORAL | Status: DC

## 2011-03-11 NOTE — Telephone Encounter (Signed)
escribe medication per fax request  

## 2011-03-24 ENCOUNTER — Ambulatory Visit (INDEPENDENT_AMBULATORY_CARE_PROVIDER_SITE_OTHER): Payer: Medicare Other | Admitting: *Deleted

## 2011-03-24 ENCOUNTER — Other Ambulatory Visit: Payer: Self-pay

## 2011-03-24 DIAGNOSIS — I428 Other cardiomyopathies: Secondary | ICD-10-CM

## 2011-03-30 ENCOUNTER — Other Ambulatory Visit: Payer: Self-pay | Admitting: *Deleted

## 2011-03-30 DIAGNOSIS — I509 Heart failure, unspecified: Secondary | ICD-10-CM

## 2011-03-30 MED ORDER — POTASSIUM CHLORIDE CRYS ER 20 MEQ PO TBCR: 20.0000 meq | EXTENDED_RELEASE_TABLET | Freq: Every day | ORAL | Status: DC

## 2011-03-30 NOTE — Telephone Encounter (Signed)
escribe medication per fax request  

## 2011-03-31 NOTE — Progress Notes (Signed)
icd remote  

## 2011-04-04 ENCOUNTER — Other Ambulatory Visit: Payer: Self-pay | Admitting: Cardiology

## 2011-04-04 NOTE — Telephone Encounter (Signed)
Refill of Glucotrol. [facility] on [address]. I have pulled the chart.

## 2011-04-04 NOTE — Telephone Encounter (Signed)
Called stating he needed refill on Glucotrol. Advised that needed to go to PCP. He will call them.

## 2011-04-08 ENCOUNTER — Ambulatory Visit: Payer: Medicare Other | Admitting: Pulmonary Disease

## 2011-04-12 ENCOUNTER — Encounter: Payer: Self-pay | Admitting: *Deleted

## 2011-04-20 ENCOUNTER — Ambulatory Visit (INDEPENDENT_AMBULATORY_CARE_PROVIDER_SITE_OTHER): Payer: Medicare Other | Admitting: Pulmonary Disease

## 2011-04-20 ENCOUNTER — Encounter: Payer: Self-pay | Admitting: Pulmonary Disease

## 2011-04-20 VITALS — BP 120/62 | HR 78 | Temp 97.8°F | Ht 72.0 in | Wt 259.8 lb

## 2011-04-20 DIAGNOSIS — G4733 Obstructive sleep apnea (adult) (pediatric): Secondary | ICD-10-CM

## 2011-04-20 NOTE — Patient Instructions (Signed)
Talk to your home care company about CPAP mask fit Follow up in one year

## 2011-04-20 NOTE — Progress Notes (Signed)
Subjective:    Patient ID: Walter French, male    DOB: [birth year], 65 y.o.   MRN: [id number]  HPI 65 yo male with OSA.   He has a full face mask.  He thinks his mask is too small.    He uses CPAP for about 5 hours per night.  He is sleeping better, and noticed more energy during the day.  He is not having sinus or throat problems.  Past Medical History  Diagnosis Date  . Asthma   . Hyperlipidemia   . Hypertension   . Coronary heart disease   . Diabetes mellitus   . OSA (obstructive sleep apnea)      Family History  Problem Relation Age of Onset  . Heart disease Brother   . Cancer Brother   . Breast cancer Mother      History   Social History  . Marital Status: Single    Spouse Name: N/A    Number of Children: N/A  . Years of Education: N/A   Occupational History  . retired Advice worker    Social History Main Topics  . Smoking status: Former Smoker -- 2.0 packs/day for 30 years    Types: Cigarettes    Quit date: 12/13/1995  . Smokeless tobacco: Not on file  . Alcohol Use: Not on file  . Drug Use: Not on file  . Sexually Active: Not on file   Other Topics Concern  . Not on file   Social History Narrative  . No narrative on file     No Known Allergies   Outpatient Prescriptions Prior to Visit  Medication Sig Dispense Refill  . albuterol (PROVENTIL,VENTOLIN) 90 MCG/ACT inhaler Inhale 2 puffs into the lungs every 4 (four) hours as needed.        [name] allopurinol (ZYLOPRIM) 300 MG tablet Take 300 mg by mouth daily.        [name] aspirin 81 MG tablet Take 81 mg by mouth daily.        [name] atorvastatin (LIPITOR) 40 MG tablet Take 40 mg by mouth daily.        . fenofibrate 160 MG tablet Take 160 mg by mouth daily.        . furosemide (LASIX) 40 MG tablet Take 1 tablet (40 mg total) by mouth daily.  30 tablet  5  . glipiZIDE (GLUCOTROL) 10 MG tablet Take 10 mg by mouth daily.        . insulin detemir (LEVEMIR) 100 UNIT/ML injection Inject into the skin as directed.         . metoprolol (TOPROL-XL) 100 MG 24 hr tablet Take 100 mg by mouth daily.        [name] olmesartan (BENICAR) 20 MG tablet Take 20 mg by mouth daily.        . Omega-3 Fatty Acids (FISH OIL) 1200 MG CAPS Take 1,200 capsules by mouth daily.        . potassium chloride SA (K-DUR,KLOR-CON) 20 MEQ tablet Take 1 tablet (20 mEq total) by mouth daily.  30 tablet  5  . rosuvastatin (CRESTOR) 20 MG tablet Take 20 mg by mouth daily.        . sitaGLIPtan (JANUVIA) 100 MG tablet Take 100 mg by mouth daily.         Review of Systems     Objective:   Physical Exam Filed Vitals:   04/20/11 1630 04/20/11 1631  BP:  120/62  Pulse:  78  Temp: 97.8 F (  36.6 C)   TempSrc: Oral   Height: 6' (1.829 m)   Weight: 259 lb 12.8 oz (117.845 kg)   SpO2:  93%   General: normal appearance, healthy appearing, and obese.  Nose: no deformity, discharge, inflammation, or lesions  Mouth: MP 3, no exudate  Neck: no JVD, thyromegaly, or lymphadenopathy.  Lungs: clear bilaterally to auscultation and percussion  Heart: regular rhythm, normal rate, and no murmurs.  Extremities: no clubbing, cyanosis, edema, or deformity noted  Neurologic: normal CN II-XII and strength normal.  Cervical Nodes: no significant adenopathy  Psych: alert and cooperative; normal mood and affect; normal attention span and concentration    Assessment & Plan:   OBSTRUCTIVE SLEEP APNEA He is doing well with CPAP.  Advised him to discuss mask fit with DME.    Updated Medication List Outpatient Encounter Prescriptions as of 04/20/2011  Medication Sig Dispense Refill  . albuterol (PROVENTIL,VENTOLIN) 90 MCG/ACT inhaler Inhale 2 puffs into the lungs every 4 (four) hours as needed.        [name] allopurinol (ZYLOPRIM) 300 MG tablet Take 300 mg by mouth daily.        [name] aspirin 81 MG tablet Take 81 mg by mouth daily.        [name] atorvastatin (LIPITOR) 40 MG tablet Take 40 mg by mouth daily.        . fenofibrate 160 MG tablet Take 160 mg by mouth daily.         . furosemide (LASIX) 40 MG tablet Take 1 tablet (40 mg total) by mouth daily.  30 tablet  5  . glipiZIDE (GLUCOTROL) 10 MG tablet Take 10 mg by mouth daily.        . insulin detemir (LEVEMIR) 100 UNIT/ML injection Inject into the skin as directed.        . metoprolol (TOPROL-XL) 100 MG 24 hr tablet Take 100 mg by mouth daily.        [name] olmesartan (BENICAR) 20 MG tablet Take 20 mg by mouth daily.        . Omega-3 Fatty Acids (FISH OIL) 1200 MG CAPS Take 1,200 capsules by mouth daily.        . potassium chloride SA (K-DUR,KLOR-CON) 20 MEQ tablet Take 1 tablet (20 mEq total) by mouth daily.  30 tablet  5  . rosuvastatin (CRESTOR) 20 MG tablet Take 20 mg by mouth daily.        . sitaGLIPtan (JANUVIA) 100 MG tablet Take 100 mg by mouth daily.

## 2011-04-20 NOTE — Assessment & Plan Note (Addendum)
He is doing well with CPAP.  Advised him to discuss mask fit with DME.

## 2011-04-26 NOTE — Cardiovascular Report (Signed)
Walter French, Walter French               ACCOUNT NO.:  [id number]   MEDICAL RECORD NO.:  [id number]          PATIENT TYPE:  INP   LOCATION:  6526                         FACILITY:  [facility]   PHYSICIAN:  Peter M. Swaziland, M.D.  DATE OF BIRTH:  Dec 24, 1945   DATE OF PROCEDURE:  11/27/2007  DATE OF DISCHARGE:                            CARDIAC CATHETERIZATION   INDICATIONS FOR PROCEDURE:  A 65 year old white male who is status post  redo coronary bypass surgery 1 year ago, presents with symptoms of  atypical chest pain and dyspnea.  Subsequent cardiac catheterization  demonstrated that both his vein grafts were still patent.  He had normal  right heart pressures and left ventricular function appeared to be well-  preserved.  He did have a high-grade anastomotic lesion of the LIMA  graft to the distal LAD.  He was brought back for intervention of this  lesion.   ACCESS:  Via the right femoral artery using standard Seldinger  technique.   EQUIPMENT:  6-French IMA catheter, Runway, a 0.014 Whisper wire, a 2.5 x  15-mm Maverick balloon, a 2.5 x 15-mm Maverick balloon.   CONTRAST:  115 mL of Omnipaque.   MEDICATIONS:  1. Versed 2 mg IV.  2. Fentanyl total of 75 mcg IV.  3. Nitroglycerin 200 mcg intracoronary x3.  4. Angiomax bolus of 0.75 mg/kg followed by continuous infusion of      1.75 mg/kg per minute.   PROCEDURE NOTE:  Initial guide shots were obtained and demonstrated a  95% stenosis of the anastomosis of the LIMA to the LAD.  The IMA itself  was long and very tortuous.  At the lesion site there was an acute  angulation.  There was TIMI grade 2 flow down the LAD.  After initial  guide shots were obtained, we brought a wire down to the lesion.  This  lesion proved to be very difficult to cross.  The wire tended to angle  up toward the more proximal LAD.  A number of wires were used including  a floppy wire, a high-torque floppy extra-support wire, a PT-2 wire, a  Prowater wire, a  [employer] 4.5 wire, an [employer] medium wire and a  Whisper wire.  After initial attempts were made, we did switch to an  over-the-wire balloon.  This was positioned in the distal mammary graft  and we were able to make the wire exchanges subsequently through this  lumen.  After multiple attempts without success, the Whisper wire did  appear to cross the lesion.  We were able to then cross the lesion with  the balloon.  We attempted to confirm our position in the distal LAD  with a dye flush through the lumen of the balloon.  This did appear to  confirm we were in the LAD but the flow was not particularly brisk and  there was a question of whether we were subintimal.  However, the  patient at this point was pain-free and hemodynamically stable.  We  proceeded to do a low-pressure balloon inflation at the lesion site and  this appeared to result in  improved flow down the graft.  We then  performed several balloon inflations at 4 and 6 and then 10 atmospheres  with a 2.0-mm balloon.  We then upgraded to a 2.5-mm balloon and did  three subsequent inflations at 6, 8 and 10 atmospheres.  Angiography at  this point did demonstrate excellent expansion of the anastomotic site.  However, there was significant dissection of the distal LAD with TIMI  grade 2 flow.  However, the patient was again pain-free and  hemodynamically stable.  He did not appear to have significant ECG  changes on the monitor and it was felt at this point that we would need  to wait and see how this site healed.   FINAL INTERPRETATION:  Partially successful balloon angioplasty of the  anastomosis of the left internal mammary artery graft to the left  anterior descending artery.  There was good expansion of the anastomosis  but there was significant dissection of the distal left anterior  descending artery and the patient will require close clinical follow-up.           ______________________________  Peter M. Swaziland,  M.D.     PMJ/MEDQ  D:  11/27/2007  T:  11/27/2007  Job:  [id number]   cc:   Dewain Penning, MD

## 2011-04-26 NOTE — Discharge Summary (Signed)
Walter French, ZARAZUA               ACCOUNT NO.:  [id number]   MEDICAL RECORD NO.:  [id number]          PATIENT TYPE:  INP   LOCATION:  6526                         FACILITY:  [facility]   PHYSICIAN:  Peter M. Swaziland, M.D.  DATE OF BIRTH:  Aug 27, 1946   DATE OF ADMISSION:  11/27/2007  DATE OF DISCHARGE:  11/28/2007                               DISCHARGE SUMMARY   HISTORY OF PRESENT ILLNESS:  Mr. Schweigert is a 65 year old white male who  presented with symptoms of chest pain and dyspnea.  Subsequent cardiac  catheterization demonstrated a high-grade stenosis of the anastomosis of  the LIMA graft to the LAD.  His other grafts were patent.  He had good  LV function.  His right heart pressures were normal.  The patient  returned for intervention of the IMA anastomosis.  For details his Past  Medical History, Social History, Family History Physical Examination,  etc., please see previously dictated H&P.   HOSPITAL COURSE:  The patient was brought to the cardiac catheterization  laboratory.  He was anticoagulated with Angiomax.  He underwent  intervention of the IMA anastomosis. This proved to be a very difficult  procedure with inability to cross the lesion with a wire despite  multiple wires and multiple attempts. Eventually, after using over-the-  wire balloon a Whisper wire, we were able to cross into the LAD.  There  was some question whether or not this was subintimal, but injection of  contrast through the balloon lumen suggested we were within the lumen of  the distal LAD.  We subsequently performed balloon inflations, and this  yielded excellent result at the anastomotic site; however, there was  extensive dissection of the distal LAD and poor distal flow.  Despite  this, the patient was pain free.  He had no ECG changes and was  hemodynamically stable.   The patient was transferred to telemetry monitoring for observation.  Subsequent cardiac enzymes were negative q.8 h x2.  CBC and  BMET  remained stable.  His creatinine at discharge of 1.41. Hemodynamically,  he was stable.  He had no arrhythmias.  His ECG showed no acute ST-T  wave changes.  The patient was progressively ambulated and, given his  stable condition, was discharged home the following day.   DISCHARGE DIAGNOSIS:  1. Coronary disease status post re-do coronary artery bypass grafting,      now with anastomotic lesion of the left internal mammary artery      graft to the left anterior descending, successful angioplasty of      this site but with extensive dissection of the distal left anterior      descending.  2. History of congestive heart failure.  3. Status post implantable cardioverter-defibrillator for ventricular      tachycardia.  4. Diabetes mellitus, type 2.  5. History of supraventricular tachycardia.  6. Hyperlipidemia.   DISCHARGE MEDICATIONS:  The patient will remain on the same medications.  This will include:  1. Aspirin 325 mg per day.  2. Lipitor 40 mg daily.  3. Benicar 20 mg per day.  4. Actos 30  mg daily.  5. Toprol XL 100 mg daily.  6. Zaroxolyn 2.5 mg daily.  7. Furosemide 60 mg per day.  8. Potassium 20 mEq per day.  9. Combivent  2 puffs p.r.n.   The patient is to avoid heavy lifting or straining for the next 5 days.  He will follow up with Dr. Swaziland in approximately 2 weeks.   DISCHARGE STATUS:  Improved.           ______________________________  Peter M. Swaziland, M.D.     PMJ/MEDQ  D:  11/28/2007  T:  11/28/2007  Job:  [id number]   cc:   Dewain Penning, MD

## 2011-04-26 NOTE — Cardiovascular Report (Signed)
NAMEGOVERNOR, MATOS               ACCOUNT NO.:  [id number]   MEDICAL RECORD NO.:  [id number]          PATIENT TYPE:  OIB   LOCATION:  2899                         FACILITY:  [facility]   PHYSICIAN:  Peter M. [place]il[place], M.D.  DATE OF BIRTH:  08-16-1946   DATE OF PROCEDURE:  11/22/2007  DATE OF DISCHARGE:  11/22/2007                            CARDIAC CATHETERIZATION   INDICATIONS FOR PROCEDURE:  The patient is a 65 year old white male with  known history of coronary artery disease status post redo coronary  artery bypass surgery in December of 2007 who presents with recurrent  chest pain and predominant symptoms of dyspnea.   PROCEDURE:  Right and left heart catheterization, coronary and left  ventricular angiography, saphenous vein graft angiography x2 and left  internal mammary artery graft angiography.   ACCESS:  Via right femoral artery and vein using standard Seldinger  technique.  The right femoral vein was heavily calcified and somewhat  difficult to access, but the patient did had no groin complications.   EQUIPMENT:  A 6-French 4 cm right and left Judkins catheter, 6-French  pigtail catheter, 6-French arterial sheath, 7-French balloon tip Swan-  Ganz catheter, 7-French venous sheath.   MEDICATIONS:  Local anesthesia 1% Xylocaine, Versed 2 mg IV and fentanyl  25 mcg.   IV CONTRAST:  150 mL of Omnipaque.   HEMODYNAMIC DATA:  Pulmonary capillary wedge pressure is 10/7 with a  mean of 6 mmHg.  Pulmonary artery pressure is 31/13 with a mean of 22  mmHg.  Right ventricular pressure is 32 with EDP of 6 mmHg and right  atrial pressure is 7/7 with a mean of 5 mmHg.  The left ventricle  pressure was 115 with EDP of 14 mmHg.  Aortic pressure is 116/63 with a  mean of 87 mmHg.  There is no significant mitral or aortic valve  gradient.  Cardiac output by thermodilution was 8.1 liters per minute  with index of 3.93.   ANGIOGRAPHIC DATA:  Left ventricular angiogram was performed in RAO  view.  This demonstrates normal left ventricular size.  There is focal  apical akinesia, otherwise good left ventricular contractility with  overall ejection fraction estimated at 50%.  There is no significant  mitral insufficiency.   The left coronary arises and distributes normally.  There is mild  atherosclerotic disease of the ostium of the left main, approximately 20  to 30%.   The left anterior descending artery has severe diffuse disease in the  mid vessel and then is occluded after takeoff of small second diagonal  branch.   The left circumflex of coronary is occluded proximally.  There are mild  left-to-left collaterals to the distal marginal vessel.   The native right coronary is occluded proximally.   There is a saphenous vein graft that inserts into the PDA.  This graft  is widely patent.  It  does have a prominent valve in the mid graft.  There is excellent runoff.   There is a saphenous vein graft to the posterolateral branch of the  right coronary.  This graft is widely patent with excellent  runoff   The LIMA graft to the LAD is patent.  It is relatively small in caliber  at the insertion site  into the LAD.  There is a high-grade stenosis at  the anastomoses of approximately 90-95%.   FINAL INTERPRETATION:  1. Severe three-vessel obstructive coronary disease.  2. Patent saphenous vein graft to the posterolateral branch of the      right coronary artery.  3. Patent saphenous vein graft to the posterior descending artery.  4. Patent left internal mammary artery graft to left anterior      descending artery with a high-grade anastomotic lesion.  5. Mild left ventricular dysfunction.  6. Normal right heart pressures and left ventricular filling pressure.           ______________________________  Peter M. [place], M.D.     PMJ/MEDQ  D:  11/22/2007  T:  11/23/2007  Job:  [id number]   cc:   Dewain Penning, Dr.

## 2011-04-26 NOTE — H&P (Signed)
NAMEPRENTIS, LANGDON NO.:  [id number]   MEDICAL RECORD NO.:  [id number]          PATIENT TYPE:  EMS   LOCATION:  MAJO                         FACILITY:  [facility]   PHYSICIAN:  Peter M. [place], M.D.  DATE OF BIRTH:  [birth year]   DATE OF ADMISSION:  08/21/2007  DATE OF DISCHARGE:  08/22/2007                              HISTORY & PHYSICAL   Mr. Edgecombe is a 65 year old white male with known history of extensive  coronary disease.  He presented in 1997 with syncope and recurrent  ventricular tachycardia and unstable angina.  He was found to have  severe three-vessel disease and underwent coronary bypass surgery at  that time.  He also had a defibrillator placed for treatment of his  ventricular tachycardia and ejection fraction at that time of 20-25%.  Subsequently he did well for a number of years until he presented in  December 2007 with recurrent dyspnea with exertion and at rest.  An  adenosine Cardiolite study showed a large area of apical infarction and  peri-infarct ischemia with ejection fraction of 24%.  He subsequently  underwent repeat cardiac catheterization which showed significant  progression of his graft disease and he underwent redo coronary artery  bypass surgery by Dr. Donata Clay. This included a LIMA graft to the LAD,  a saphenous vein graft to the posterior descending and posterolateral  branches of the right coronary.  It was previously noted that the vein  graft to the more obtuse marginal vessel was also occluded and this was  not revascularize on his follow-up attempt.  Subsequent to that the  patient did have some improvement in his left ventricular function with  echocardiogram in February of 2008 showing ejection fraction of 35-45%.  However, Mr. Ullman presents at this time with recurrent symptoms of  chest pain with both typical and atypical features.  He has a sharp  instantaneous pain that takes his breath away.  He also has chest  pressure  and heaviness.  He complains of shortness of breath  particularly when he stoops or bends over.  Because of his refractory  symptoms it is recommended he undergo repeat cardiac catheterization  with a right heart catheterization to assess left ventricular filling  pressures and pulmonary pressures.   PAST MEDICAL HISTORY:  1. Coronary artery disease as noted above.  2. History of ventricular tachycardia status post ICD implant.  3. Diabetes mellitus type 2.  4. Ischemic cardiomyopathy with congestive heart failure.  5. Prior history of supraventricular tachycardia.  6. Hyperlipidemia.  7. Obesity.  8. History of bronchospastic pulmonary disease.   CURRENT MEDICATIONS:  1. Aspirin 325 mg per day.  2. Lipitor 40 mg per day.  3. Benicar 20 mg per day.  4. Actos 30 mg per day.  5. Toprol XL 100 mg daily.  6. Zaroxolyn 2.5 mg daily.  7. Furosemide 40 mg per day.  8. Potassium 20 mEq per day.  9. Combivent inhaler 2 puffs q.6h. p.r.n.   ALLERGIES:  THE PATIENT HAS NO KNOWN ALLERGIES.   SOCIAL HISTORY:  The patient works as a Curator.  He denies alcohol  use.  He quit smoking in 1997.   FAMILY HISTORY:  Noncontributory.   REVIEW OF SYSTEMS:  He has had no increased edema recently.  He has  continued to work regularly despite his limitations.  He does report a  prior history of sleep apnea and gastroesophageal reflux disease.  Other  review of systems are negative.   PHYSICAL EXAMINATION:  He is an obese white male in no distress.  His  weight is 248, blood pressure 140/90, pulse is 80 and regular.  HEENT:  His pupils are equal and reactive.  Oropharynx is clear.  NECK:  Without JVD, adenopathy or bruits.  LUNGS:  Clear.  CARDIAC:  Without gallop, murmur, rub or click.  ABDOMEN:  Soft, nontender and obese without masses or bruits.  EXTREMITIES:  Without edema.  Pulses were 2+ and symmetric.  NEUROLOGIC:  Exam is nonfocal.   LABORATORY DATA:  ECG shows normal sinus rhythm  with chronic ST-T wave  changes in the inferior lateral leads and poor R-wave progression in V1-  V3.   IMPRESSION:  1. Recurrent chest pain.  Need to rule out recurrent ischemia.  2. Symptoms of dyspnea, question related to congestive heart failure      with elevated filling pressures versus bronchospastic pulmonary      disease and sleep apnea.  3. Diabetes mellitus type 2.  4. Status post redo coronary artery bypass surgery 1 year ago.  5. Ischemic cardiomyopathy with congestive heart failure.  6. Ventricular tachycardia status post ICD implant.  7. History of SVT.  8. Dyslipidemia.   PLAN:  We will proceed with right and left heart catheterization,  coronary and graft angiography.           ______________________________  Peter M. [place], M.D.     PMJ/MEDQ  D:  11/20/2007  T:  11/20/2007  Job:  [id number]   cc:   Rosalie Gums, M.D.

## 2011-04-29 NOTE — Cardiovascular Report (Signed)
[place]  Patient:    Walter French, Walter French                        MRN: [id number] Proc. Date: 12/19/00 Adm. Date:  [id number] Attending:  [place], Peter Manning                        Cardiac Catheterization  INDICATIONS FOR PROCEDURE:  The patient is a 65 year old white male, who recently had episode of severe chest pain.  The patient has known severe atherosclerotic coronary artery disease and status post coronary artery bypass surgery in 1997.  He has a history of ventricular tachycardia and status post ICD implant.  ACCESS:  Via the right femoral artery using the standard Seldinger technique.  EQUIPMENT:  A 6 French 4 cm right and left Judkins catheter, 6 French pigtail catheter, 6 French arterial sheath.  MEDICATIONS:  Local anesthesia with 1% Xylocaine.  CONTRAST:  Omnipaque 160 cc.  HEMODYNAMIC DATA:  Aortic pressure is 140/88.  Left ventricular pressure is 140 with an EDP of 27 mmHg.  ANGIOGRAPHIC DATA:  Left coronary artery:  The left coronary artery arises and distributes normally.  Left main:  The left main coronary artery has moderate irregularities, less than 10%.  Left anterior descending:  The left anterior descending artery is occluded in the mid vessel following a large septal perforator branch and a diagonal branch.  Left circumflex:  The left circumflex coronary artery is occluded proximally.  Right coronary artery:  The right coronary artery is occluded proximally.  The saphenous vein graft to the right coronary artery has a tortuous course. The vein graft itself has some irregularities up to 20% in the proximal and mid graft.  The graft inserts into a posterolateral branch, fills retrograde to the posterior descending artery.  No significant obstructive disease is noted.  The saphenous vein graft to the LAD is widely patent.  It inserts in the mid LAD.  There are minor irregularities in the distal LAD up to 20%.  The  LAD gives collaterals to a obtuse marginal vessel in the circumflex and also gives some collaterals to the distal PDA.  Saphenous vein graft to the obtuse marginal vessel is widely patent.  Again, the graft demonstrates mild irregularities, less than 20%.  LEFT VENTRICULAR ANGIOGRAPHY:  The left ventricular angiography performed in the RAO view demonstrates normal left ventricular size with somewhat hyperdynamic left ventricular function.  Ejection fraction estimated at 70-75%.  No segmental wall motion abnormalities are seen.  There is no mitral regurgitation or prolapse.  FINAL INTERPRETATION: 1. Severe three-vessel obstructive atherosclerotic coronary artery disease. 2. Excellent patency of all three saphenous vein grafts including saphenous    vein graft to the left anterior descending, saphenous vein graft to    obtuse marginal vessel, and saphenous vein graft to the posterolateral    branch of the right coronary artery. 3. Normal left ventricular function.  PLAN:  Based on these results, would recommend continued medical therapy. DD:  12/19/00 TD:  12/19/00 Job: [id number]

## 2011-04-29 NOTE — Op Note (Signed)
Walter French, Walter French                           ACCOUNT NO.:  [id number]   MEDICAL RECORD NO.:  [id number]                   PATIENT TYPE:  OIB   LOCATION:  2899                                 FACILITY:  [facility]   PHYSICIAN:  Duke Salvia, M.D. [facility]           DATE OF BIRTH:  02/03/1946   DATE OF PROCEDURE:  DATE OF DISCHARGE:                                 OPERATIVE REPORT   PREOPERATIVE DIAGNOSES:  1. Ventricular tachycardia.  2. Ischemic heart disease.  3. Prior defibrillator implantation, but now at elective replacement.   POSTOPERATIVE DIAGNOSES:  1. Ventricular tachycardia.  2. Ischemic heart disease.  3. Prior defibrillator implantation, but now at elective replacement.   PROCEDURE:  Explantation of a previously implanted subpectoral  defibrillator, insertion of a new defibrillator, intraoperative  defibrillation threshold testing and pocket revision with the creation of a  subpectoral pocket.   Following obtaining informed consent, the patient was brought to the  electrophysiology laboratory and placed on the fluoroscopic table in the  supine position.  After routine prepping and draping of the left upper  chest, lidocaine was infiltrated along the line of the previous incision and  carried down to the layer of the pectoralis muscle with sharp dissection and  electrocautery.  The pectoralis muscle was explored to find line of the  previous scar, this was accomplished and the pectoralis major muscle was  separated down to the layer of the defibrillator pocket; this process took  about 45 minutes.  The leads were then freed up from the scar tissue in  which they were deeply imbedded.  The bipolar R-wave was 27 millivolts with  pacing impedence of 547 ohms, pacing threshold of 0.7 volts at 0.5  milliseconds, and current threshold was 1.5 MA.  With these acceptable  parameters recorded, the lead was then attached to a Medtronic Maximo VR  7232 CX ICD, serial #[id number] S  defibrillator.  Through the device, a  bipolar R-wave was 16.9 millivolts with a pacing impedence of 472 ohms, a  pacing threshold of 1 volt at 0.3 milliseconds and high voltage impedence of  64 ohms.  With these acceptable parameters recorded, the device was  implanted there, however initially the subpectoral pocket was affixed  anteriorly and posteriorly across its width.  The leads, as mentioned, had  been freed up and a subcutaneous prepectoral pocket was formed.  The leads  and the pulse generator were then placed in the pocket, secured to the  prepectoral fascia. Ventricular fibrillation was then induced using a T-wave  shock.  After a total duration of 6.5 seconds, a 25 joule shock was  delivered through a measured resistance of 60 ohms, terminated ventricular  fibrillation, restoring sinus rhythm.   [name] wound was then closed in layers in the normal fashion.  The  wound was washed, dried and Benzoin Steri-Strip dressing was then applied.  Please note also that the pectoralis  muscle was affixed loosely with a 2-0  suture after the subpectoral pocket was close.   The patient tolerated the procedure without apparently complications.                                               Duke Salvia, M.D. [facility]   SCK/MEDQ  D:  11/15/2002  T:  11/15/2002  Job:  [id number]

## 2011-04-29 NOTE — Op Note (Signed)
NAMELY, Walter French               ACCOUNT NO.:  [id number]   MEDICAL RECORD NO.:  [id number]          PATIENT TYPE:  INP   LOCATION:  2310                         FACILITY:  [facility]   PHYSICIAN:  Kerin Perna, M.D.  DATE OF BIRTH:  1946-07-15   DATE OF PROCEDURE:  01/02/2007  DATE OF DISCHARGE:                               OPERATIVE REPORT   OPERATION:  Redo coronary artery bypass grafting (left IMA to LAD,  sequential saphenous vein graft to posterior descending and  posterolateral branch of the right coronary), flexible bronchoscopy.   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   SURGEON:  Kerin Perna, M.D.   ASSISTANT:  Rowe Clack, P.A.-C.   ANESTHESIA:  General by Maren Beach, M.D.   INDICATIONS:  The patient is a 65 year old male who underwent emergency  coronary bypass surgery in 1997 for an acute MI with refractory  ventricular fibrillation-ventricular tachycardia.  At that time 3 veins  were placed in the LAD circumflex and distal right coronaries.  He  subsequently had an AICD placed postop.  He did well; and he had patent  vein grafts when he was recatheterized in 2002.  He presented with  recurrent chest pain; however, and a repeat cath, at this time, showed a  recently occluded vein graft to his LAD.  A chronically occluded vein  graft to the circumflex; and a patent vein graft to the right coronary  but with some  proximal and distal disease.  His EF was 30%.  He was  felt to be candidate for redo bypass surgery.   Prior to surgery, I examined the patient in the office; and reviewed  results of the cardiac cath with the patient and family.  I discussed  the indications and expected benefits of redo coronary artery bypass  surgery.  I reviewed the alternatives to surgical therapy as well.  I  discussed, with the patient, the major aspects of planned procedure  including the choice of conduit to include internal mammary artery; and  endoscopically harvested saphenous vein from the right leg, the location  of surgical incisions, the use of general anesthesia and cardiopulmonary  bypass, and the expected postoperative hospital recovery.  I reviewed  with the patient the risks to him of redo coronary bypass surgery  including the risks of MI, CVA, bleeding, infection, and death.  After  reviewing these indications, he demonstrated his understanding and  agreement to proceed with surgery under what, I felt, was an informed  consent.   OPERATIVE FINDINGS:  [name] coronaries were imbedded in a deep  layer of epicardial fat.  The ascending aorta is scarred and  foreshortened.  The vein was harvested successfully endoscopically from  the right thigh and the left internal mammary artery was used and was a  good conduit.  The patient did not require any packed cells for the  operation, but did received a unit of platelets following reversal of  heparin with protamine.  The patient had a bronchoscopy performed at the  termination  of procedure for low oxygen saturations at the end of  surgery to make sure that he did not have a mucous plugging.   PROCEDURE:  The patient was brought to operating room and placed supine  on the operating table where general anesthesia was induced.  Under  invasive hemodynamic monitoring, a transesophageal 2-D echo probe was  placed by the anesthesiologist which demonstrated ejection fraction of  30% without significant valvular disease.  The patient was prepped and  draped and a sternal incision was made through the previous sternotomy.  The sternum was opened using an oscillating saw with care being taken to  avoid injury to the underlying vascular structures.  The vein was  harvested endoscopically from the right leg.  The anterior mediastinal  adhesions were dense and were dissected sharply.  The left internal   mammary artery was then harvested after the left hemithorax was elevated  with the mammary Barr retractor.   Next the sternal retractor was placed using the deep blades.  The  remainder of the anterior dissection over the right atrium, right  ventricle, aorta, and RV outflow tract was completed; so as to expose  the ascending aorta and right atrium.  Pursestring's were placed in the  ascending aorta and right atrium; and the patient was given heparin; and  the ACT was documented as being therapeutic for bypass.  The patient was  then cannulated, and placed on bypass.  The remainder of the lateral  dissection was carried out, but no posterior dissection was carried out  until the patient was completely arrested with cardioplegia.   The vein grafts were prepared for the distal anastomosis and the  cardioplegia catheters were placed for both antegrade and retrograde  cardioplegia.  The patient was cooled to 32 degrees.  The aortic  crossclamp was then applied.  Then 800 mL of cold blood cardioplegia was  delivered in split doses between the antegrade aortic and retrograde  coronary sinus catheters.  There is good cardioplegic arrest and septal  temperature dropped less than 15 degrees.   The distal coronary anastomoses were performed.  The first distal  anastomosis was the sequential vein graft to the PD and PL branch of the  right coronary.  The PD was a 1.06-mm vessel with total proximal  occlusion.  A side-to-side anastomosis with a saphenous vein was sewn  using running 7-0 Prolene with good flow through graft.  The second  distal anastomosis was a continuation of the sequential vein graft to  the PL.  This was a 1.5-mm vessel with a proximal high-grade stenosis.  The end of the vein was sewn end-to-side with a running 7-0 Prolene.  There was good flow through the graft.  Cardioplegia was redosed.  The  third distal anastomosis was to the LAD distal to the previous vein graft  anastomosis.  The left IMA pedicle was brought through an opening  created in the left lateral pericardium; which was brought down onto the  LAD and sewn end-to-side with a running 8-0 Prolene.  There was good  flow through the anastomosis after briefly releasing the pedicle bulldog  on the mammary artery.  The mammary bulldog was reapplied and the  pedicle was secured in the epicardium.   Cardioplegia was redosed.  While the crossclamp was still in place, the  proximal vein anastomosis to the sequential vein graft to the right  coronary was then placed on the ascending aorta.  Using a 4.0-mm punch  running 6-0 Prolene.  Prior to tying down the proximal anastomosis, air  was vented from the coronaries; and the left side of heart using a dose  of retrograde warm blood cardioplegia.  The final proximal anastomosis  was tied and the crossclamp was removed.   The heart resumed a spontaneous rhythm.  The vein grafts were opened;  and each had good flow.  The mammary artery graft was opened and had  good flow.  The proximal and distal anastomoses were checked and found  to be hemostatic.  The cardioplegia catheters were removed.  Temporary  pacing wires were applied.  The patient was rewarmed to 37 degrees.  The  lungs were re-expanded; and the ventilator was resumed.  When the  patient was rewarmed and reperfused he was weaned from bypass on low-  dose dopamine without difficulty with stable blood pressure and cardiac  output.  Protamine was administered without adverse reaction.  The  cannulas were removed.  The mediastinum was irrigated with warm  antibiotic irrigation.  The leg incision was irrigated and closed in a  standard fashion.  The patient was given a platelet and FFP transfusion  for coagulopathy after reversal of heparin with protamine.  This  improved the coagulation function.  The superior pericardial fat was  closed over the aorta.  Two mediastinal and a left pleural chest  tube  were placed and brought out through separate incisions.  The sternum was  closed with interrupted steel wire.  The pectoralis fascia was closed  with a running #1 Vicryl.  The subcutaneous and the skin layers were  closed with a running Vicryl and sterile dressings were applied.  Total  bypass time was 120 minutes with crossclamp time of 89 minutes.   Because the patient had low oxygen saturation on 100% percent oxygen, a  flexible bronchoscope was used to examine the endobronchial anatomy.  After termination of the procedure.  There was scant secretions in the  right lung.  The superior segmental orifice of the right lower lobe was  collapsed, but there is no evidence of endobronchial lesion, at that  point, or anywhere else.  After a therapeutic bronchoscopy with  irrigation of the secretions.  The bronchoscope was removed; and the  patient was transported back to the ICU with saturation of 94% of 100%  FIO2.      Kerin Perna, M.D.  Electronically Signed    PV/MEDQ  D:  01/02/2007  T:  01/02/2007  Job:  [id number]   cc:   [facility] Office  Peter M. [place]il[place], M.D.

## 2011-04-29 NOTE — Discharge Summary (Signed)
NAMEKENNEY, GOING               ACCOUNT NO.:  [id number]   MEDICAL RECORD NO.:  [id number]          PATIENT TYPE:  INP   LOCATION:  2025                         FACILITY:  [facility]   PHYSICIAN:  Lonia Blood, M.D.      DATE OF BIRTH:  [birth year]   DATE OF ADMISSION:  01/20/2007  DATE OF DISCHARGE:  01/24/2007                               DISCHARGE SUMMARY   PRIMARY CARE PHYSICIAN:  Dr. Swaziland.   DISCHARGE DIAGNOSES:  1. Left lower lobe pneumonia with small pleural effusion.  2. Sinus tachycardia.  3. Diabetes type 2.  4. Postoperative anemia following a redo coronary artery bypass graft.  5. Coronary artery disease status post redo coronary artery bypass      graft.  6. Bronchospastic pulmonary disease.  7. Obesity.   DISCHARGE MEDICATIONS:  1. Avelox 400 mg daily for 5 days.  2. Lasix 40 mg daily.  3. Toprol XL 100 mg daily.  4. Benicar 20 mg daily.  5. Lipitor 20 mg daily.  6. Actos 30 mg daily.  7. Zaroxolyn 5 mg daily.  8. Combivent inhaler twice a day.   DISPOSITION:  The patient is to follow-up with Dr. Donata Clay and an  appointment will be scheduled for him  also with Dr. Swaziland as well as  Dr. Tad Moore who is apparently his primary care physician in [facility].   DISPOSITION:  The patient was discharged in fair health.  He is to  complete antibiotic therapy using Avelox for a few days.   PROCEDURES PERFORMED:  Include chest x-ray on February9, 2008 that  showed left lower lobe atelectasis and pneumonia and probable small left  pleural effusion.  Another chest x-ray on February12, 2008 that shows no  pneumothorax after thoracentesis.  Mild decrease in left effusion.  Ultrasound guided  thoracentesis on February12, 2008 that showed  findings that are consistent with bloody fluid.  No organism and  transudate more or less.   CONSULTATIONS:  Dr. Zenaida Niece Trigt-Cardiovascular surgery.   BRIEF HISTORY AND PHYSICAL:  Please refer to dictated history and  physical on  admission by Dr. Jamison Oka.  In short, however, this  is a 65 year old gentleman who was discharged from the hospital about a  month earlier after a redo coronary artery bypass graft.  The patient  returned with 3 days of fever and cough.  In the ED chest x-ray revealed  left lower lobe pneumonia with small pleural effusion.  He was  subsequently admitted for further management.   HOSPITAL COURSE:  1. Left lower lobe pneumonia.  The patient was treated with initially      Cefepime and vancomycin.  In the setting of recent hospitalization      he was being covered for hospital acquired pneumonia.  Subsequently      he had thoracentesis performed for his pleural effusion that      indicated that this was more or less a transudate.  No evidence of      infection or empyema.  With that he was discharged home on oral      antibiotics to  complete 10-14 days.  2. Sinus tachycardia.  The patient did have sinus tachycardia on      admission but with treatment of his infection that disappeared.  3. Type 2 diabetes.  He was placed on his Actos sliding scale insulin      as in the hospital and his blood sugars were more or less      controlled.  Other medical problems were also quite stable in the      hospital and he was seen by CVTS because of recent surgery and      follow-up was scheduled after discharge.      Lonia Blood, M.D.  Electronically Signed     LG/MEDQ  D:  03/28/2007  T:  03/28/2007  Job:  [id number]

## 2011-04-29 NOTE — Discharge Summary (Signed)
NAMETHORSTEN, CLIMER               ACCOUNT NO.:  [id number]   MEDICAL RECORD NO.:  [id number]          PATIENT TYPE:  INP   LOCATION:  2002                         FACILITY:  [facility]   PHYSICIAN:  Kerin Perna, M.D.  DATE OF BIRTH:  1946-02-28   DATE OF ADMISSION:  01/02/2007  DATE OF DISCHARGE:  01/07/2007                               DISCHARGE SUMMARY   ADDENDUM:  [name] discharge was held on January 06, 2007, for  reconditioning.  The patient had some hypokalemia on January 06, 2007,  and this was replaced with potassium.  She did have some  thrombocytopenia which continued to improve.  The patient was  reconditioning with cardiac rehab with a steady gait.  The patient has a  history of pulmonary disease and she has been continuing her Combivent.  Her antibiotics were discontinued.  The patient remained in normal sinus  rhythm.  CBGs have been well controlled.  She was discharged home in  good condition.   Follow-up appointments, instructions, and medications remain the same.      Constance Holster, [place]      Kerin Perna, M.D.  Electronically Signed    JMW/MEDQ  D:  02/13/2007  T:  02/14/2007  Job:  [id number]

## 2011-04-29 NOTE — Discharge Summary (Signed)
Walter French, Walter French               ACCOUNT NO.:  [id number]   MEDICAL RECORD NO.:  [id number]          PATIENT TYPE:  INP   LOCATION:  2002                         FACILITY:  [facility]   PHYSICIAN:  Kerin Perna, M.D.  DATE OF BIRTH:  10-14-1946   DATE OF ADMISSION:  01/02/2007  DATE OF DISCHARGE:  01/07/2007                               DISCHARGE SUMMARY   CARDIOLOGIST:  Peter M. Swaziland, M.D.   PULMONOLOGIST:  Leslye Peer, MD   ADMISSION DIAGNOSES:  Severe recurrent coronary artery disease with  class III congestive heart failure -- anginal equivalent.   DISCHARGE DIAGNOSES:  1. Severe recurrent coronary disease status post redo coronary artery      bypass grafting.  2. Hyperlipidemia.  3. Hypertension.  4. Type 2 diabetes mellitus.  5. Obesity.  6. Bronchiostatic pulmonary artery disease, smoking -- stopped 10      years ago.  7. Chronic obstructive pulmonary disease.  8. History of ischemic cardiomyopathy.  9. History of ventricular tachycardia, status post ICD implant.  10.History of supraventricular tachycardia, treated with chronic beta      blocker therapy.  11.Status post coronary artery bypass grafting in 1997.   CONSULTATIONS:  January 03, 2007 -- Leslye Peer, MD,  pulmonary  critical care management was consulted.   PROCEDURES:  On January 02, 2007 the patient underwent a redo coronary  bypass grafting x3 (LIMA to the LAD, sequential saphenous vein graft to  the PDA and PL).  DVH right thigh by Dr. Kathlee Nations Trigt.   HISTORY AND PHYSICAL:  This a 65 year old male who underwent emergency  coronary artery bypass grafting in 1997 for an acute MI with refractory  atrial fibrillation/ventricular tachycardia.  At the time 3 veins were  replaced at LAD, circumflex and distal right coronary.  He subsequently  had an ICD placed postoperatively.  The patient did well and he had  patent vein grafts. when he was re-cathed in 2002.  Recently the patient  presented  with chest pain;  however a repeat catheterization at this  time showed recently occluded vein graft to his LAD,  chronically  occluded vein graft to the circumflex and a patent vein graft to the  right coronary (but some proximal and distal disease).  He had an EF of  30%.  The patient was felt to be a candidate for a redo bypass surgery.  Please see the dictated history and physical for further details.   HOSPITAL COURSE:  [name] hospital course was uneventful and he is  progressing as expected.  On January 02, 2007 the patient underwent a  redo CABG x3 by Dr. Kathlee Nations Trigt, without any complications.  The  patient does have a history of COPD. PND and bronchiectatic lung  disease; so Dr. Delton Coombes from pulmonary was consulted to help take care of  the patient.  The patient was placed on Atrovent nebulizers and a short  course of steroids.  The patient will also start IV cefepime.  The  patient did have sputum cultures, which continued to be negative to  date.  His  cefotaxime will be discontinued in the a.m..  The patient was  extubated without any difficulty.  His saturations on January 04, 2007  were 94% on 4 liters, and his saturations on postoperative  day #3 were  92% 2 liters.  [name] oxygen saturations will continue to be  weaned.  The patient is continuing his incentive spirometry  appropriately.   The patient did have some postoperative volume overload, and he is being  diuresed with Zaroxylan and Lasix drip postoperatively.  The patient was  placed to p.o. Lasix and is responding appropriately.  He did have some  acute blood loss anemia; however, did not require any blood transfusions  and his hemoglobin/hematocrit have remained stable.   The patient did have postoperative thrombocytopenia.  His platelet count  was 98 on postoperative day #1.  This did increase to 143 on  postoperative day #2, and to 126 on postoperative day #3. The patient  did have blood work  for a __________ panel, and this was pending.  This  will be monitored closely.  There are no obvious signs of ongoing  bleeding.   On January 03, 2007 speech pathology saw the patient, and they saw no  dysphagia per BSS.  The patient was able to eat a regular diet and sit  up for all p.o.'s.  and eat and drink water when alert.  The patient  does have a history of diabetes.  He was maintained on sliding scale  insulin and Lantus postoperatively.  [name] CBG's were well  controlled and on postoperative day #3 his Lantus was discontinued; he  was started on p.o. Actos.   The patient is ambulating with cardiac rehabilitation with a steady  gait.  He was walking 390 feet on January 05, 2007.  [name] O2  saturations were 92% on 2 liters after the patient was walked.  Postoperatively the patient has maintained normal sinus rhythm.  His  blood pressure has been slightly elevated, and his Lopressor was  increased multiple times.  On postoperative day #3 his Lopressor was  increased to 100 mg daily, with a blood pressure of 123/76.  This was  his __________ preoperatively.  The patient also was continued on  Benicar and diuretics.  [name] renal function has been well  controlled with a creatinine of 1.3, potassium 3.7.  White blood cell  count has been 12.6.   DISCHARGE DISPOSITION:  The patient will be discharged home in the next  2-3 days; provided he remains afebrile, he is able to be weaned from his  oxygen, blood pressure is well controlled and he is in normal sinus  rhythm.   MEDICATIONS:  1. Aspirin 81 mg p.o. daily.  2. Toprol XL 100 mg p.o. daily.  3. Benicar 20 mg p.o. daily.  4. Lipitor 20 mg p.o. daily.  5. Ultram 50 mg 1-2 tablets every 4 hours.  6. Prilosec 20 mg p.o. daily.  7. Lactos 30 mg p.o. daily.  8. Combivent inhaler 2 puffs q.i.d.  9. Lasix 20 mg p.o. daily x5 days.  10.Potassium chloride 20 mEq p.o. daily x3 days. 11.Zaroxylan 5 mg p.o.  daily.  12.Albuterol inhaler 2 puffs every 4 hours p.r.n.   INSTRUCTIONS:  The patient instructed to follow a low-fat, low-salt  diabetic diet.  No driving or heavy lifting greater than 10 pounds.  Patient is to ambulate 3x daily and increase activity as tolerated.  He  is to continue his breathing exercises.  He  may shower and cleanse his  incisions with mild soap and water.  Call us should any wound problems  arise:  such as incision erythema, drainage, or temperature greater than  101.5.   FOLLOW UP:  The patient is to follow up with Dr. Swaziland in 2 weeks.  He  is to call for an appointment.  He will have a chest x-ray taken by the  cardiologist.  The patient is to follow up with Dr. Donata Clay in 3  weeks; the office will contact him with the time and day of the  appointment.      Constance Holster, [place]      Kerin Perna, M.D.  Electronically Signed    JMW/MEDQ  D:  01/05/2007  T:  01/05/2007  Job:  [id number]   cc:   Peter M. Swaziland, M.D.  Carlis Stable, M.D.

## 2011-04-29 NOTE — H&P (Signed)
[place]  Patient:    Walter French, Walter French                        MRN: [id number] Adm. Date:  01/14/01 Attending:  Peter M. Swaziland, M.D.                         History and Physical  HISTORY OF PRESENT ILLNESS:  Mr. Ferencz is a 65 year old white male, who has a history of severe atherosclerotic coronary artery disease.  He presented in April of 1997 with recurrent syncope, related to ventricular tachycardia and ventricular fibrillation.  He was found to have severe three-vessel coronary artery disease and also had a severe left ventricular dysfunction.  The patient underwent coronary bypass surgery x 3 with a saphenous vein graft to the LAD, a saphenous vein graft to obtuse marginal vessel, a saphenous vein graft to the right coronary artery.  He had a very rocky postoperative course with repetitive episodes of ventricular tachycardia and fibrillation.  He had an ICD implant.  Actually, since then he has done quite well.  His left ventricular function had improved with the last echocardiogram in December of 1998 showing an ejection fraction of 45-50%.  He had mild mitral insufficiency.  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  His pain lasted 30 minutes to an hour.  He had some associated shortness of breath and vague nausea. Because of his recurrent significant symptoms, it is felt the patient should undergo cardiac catheterization, especially in light of his past complicated history.  PAST MEDICAL HISTORY:  Significant of ASCAD, ischemic cardiomyopathy, ventricular tachycardia, history of gastroesophageal reflux disease.  ALLERGIES:  He has no known allergies.  MEDICATIONS: 1. Toprol XL 50 mg per day. 2. Aspirin daily. 3. Altace 5 mg per day. 4. Prilosec 20 mg per day.  SOCIAL HISTORY:  The patient has worked in Building surveyor.  He denies alcohol or current tobacco use.  FAMILY  HISTORY:  Noncontributory.  REVIEW OF SYSTEMS:  Otherwise unremarkable.  It is significant for weight gain recently.  PHYSICAL EXAMINATION:  VITAL SIGNS:  Weight is 237.  Blood pressure is 112/80, pulse is 80 and regular.  HEENT:  He has deviation of his right eye to the right.  Extraocular movements are full.  His fundi are benign.  Oropharynx is clear.  NECK:  Without JVD, adenopathy, thyromegaly, or bruits.  LUNGS:  Clear.  CARDIAC:  Examination reveals a regular rate and rhythm without gallops, rubs, murmurs, or clicks.  ABDOMEN:  Soft, nontender.  There is no hepatosplenomegaly, masses, or bruits.  PULSES:  Femoral and pedal pulses are 2+ and symmetric.  He has no edema or cyanosis.  NEUROLOGICAL:  Examination is nonfocal.  LABORATORY DATA:  Chest x-ray shows marked cardiomegaly without active CHF. ECG shows normal sinus rhythm with old inferior myocardial infarction.  No acute changes.  Other labs are pending.  IMPRESSION: 1. Prolonged chest pain consistent with recurrent angina. 2. Atherosclerotic coronary artery disease, status post coronary artery bypass    graft x 3 in 1997. 3. Cardiomegaly with history of ischemic cardiomyopathy. 4. Gastroesophageal reflux disease. 5. History of ventricular tachycardia, status post internal    cardioverter-defibrillator implant.  The patient has not had to use    his internal cardioverter-defibrillator since the night of his surgery.  PLAN:  The patient will be admitted for cardiac catheterization.  DD:  12/13/00 TD:  12/13/00 Job: [id number]

## 2011-04-29 NOTE — H&P (Signed)
Walter French, Walter French               ACCOUNT NO.:  [id number]   MEDICAL RECORD NO.:  [id number]          PATIENT TYPE:  INP   LOCATION:  2025                         FACILITY:  [facility]   PHYSICIAN:  Mobolaji B. Bakare, M.D.DATE OF BIRTH:  Apr 26, 1946   DATE OF ADMISSION:  01/20/2007  DATE OF DISCHARGE:                              HISTORY & PHYSICAL   PRIMARY CARE PHYSICIAN:  Unassigned.  (Dr. Madelon Lips in [facility]).   CARDIOLOGIST:  Peter M. Swaziland, M.D.   CARDIOTHORACIC SURGEON:  Kerin Perna, M.D.   CHIEF COMPLAINT:  Fever and cough for 3 days.   HISTORY OF PRESENT ILLNESS:  The patient is a 65 year old Caucasian male  who recently underwent redo coronary bypass surgery (January 02, 2007).  He was discharged home on January 05, 2007.  He has been in a steady  state of health until 3 days ago, when he developed fever associated  with cough and shortness of breath.  The cough is productive of  yellowish-green sputum.  There is pleuritic chest pain, especially over  the incision site.  This afternoon he developed chills, so he decided to  come to the hospital.  There is no nausea, vomiting or diarrhea.  No  pain or swelling at the site of harvested vein.   A chest x-ray done in the emergency room revealed left lower lobe  pneumonia with small pleural effusions.  He has had blood cultures  drawn, and he has received Rocephin and Zithromax intravenously.   REVIEW OF SYSTEMS:  The patient denies wheezing.  He does have  difficulty lying down flat.  No headaches, vomiting, abdominal pain or  diarrhea.  No dysuria or urgency.   PAST MEDICAL HISTORY:  1. Coronary artery disease, status post bypass surgery in 1997.  Redo      coronary artery bypass grafting in January 2008.  2. Type 2 diabetes mellitus.  3. Hyperlipidemia.  4. History of supraventricular tachycardia.  5. Ventricular tachycardia, status post ICD implant.  6. Ischemic cardiomyopathy with ejection fraction of 40%  in December      2007.  7. Bronchospastic pulmonary disease with chronic obstructive pulmonary      disease.  8. Probable sleep apnea.  9. Hypertension.   MEDICATIONS:  1. Aspirin 81 mg daily.  2. Toprol XL 100 mg p.o. daily.  3. Benicar 20 mg daily.  4. Lipitor 20 mg daily.  5. Ultram 50 mg 1-2 tablets q.4 h p.r.n.  6. Prilosec 20 mg daily.  7. Actos 20 mg daily.  8. Combivent inhaler 2 puffs q.i.d.  9. Zaroxylan 5 mg daily.  10.Albuterol inhaler q.4 h p.r.n.   ALLERGIES:  NO KNOWN DRUG ALLERGIES.   FAMILY HISTORY:  Both parents are deceased.  Mother passed away from  bone cancer.  Father passed away from cerebral hemorrhage.   SOCIAL HISTORY:  The patient lives alone.  He is independent.  He works  as a Curator.  He does not drink alcohol.  He quit smoking in 1997; he  had smoked for about 25 years and used 1-2 packs per day.  PHYSICAL EXAMINATION:  VITAL SIGNS:  Temperature 100.7, blood pressure  140/78, pulse 141, respiratory rate 25, O2 saturations 95% on room air.  GENERAL:  On examination the patient is acutely ill appearing.  He is  uncomfortable.  Coughs with difficulty.  HEENT:  Normocephalic and atraumatic.  Pupils are equal, round and  reactive to light.  Mucosal membranes without thrush and are moist.  NECK:  No elevated JVD.  LUNGS:  Diminished air entry in left lung base.  No wheeze.  Minimal  respiratory rhonchi bilaterally.  CVS:  S1, S2 tachycardia.  ABDOMEN:  Nondistended, soft and nontender.  Bowel sounds present.  EXTREMITIES:  No pedal edema or calf tenderness.  Right calf appears  more swollen than the left; however, the patient has had vein graft  harvested from right lower extremity.  Dorsalis pedis pulses are 2+  bilaterally.  CNS:  No focal neurological deficit.   LABORATORY DATA:  Transferase A&D antigens were negative.  Sodium 133,  potassium 3.7, chloride 103, CO2 20, glucose 103, BUN 13, creatinine  1.09.  Bilirubin 1.2, alkaline  phosphatase 102, AST 22, ALT 12, total  protein 7.2, albumin 3.2, calcium 9.0.  White blood cell count 7.6,  hemoglobin 10.3, hematocrit 30.  MCV 83.   CHEST X-RAY:  Shows left lower lobe pneumonia/atelectasis, and probable  small left pleural effusion.  Increased since x-ray of January 05, 2007.   EKG:  Shows sinus tachycardia with a heart rate of 139.  T-wave  abnormality in inferolateral leads.   ASSESSMENT AND PLAN:  The patient is a 65 year old Caucasian male who  recently underwent redo coronary artery bypass grafting January 2008.  He has done well postoperatively until 3 days ago, when he developed  cough and shortness of breath, and fever with chills.  Chest x-ray was  suggestive of left lower lobe pneumonia.  He has a normal white cell  count.   ADMISSION DIAGNOSES:  1. LEFT LOWER LOBE PNEUMONIA WITH SMALL PLEURAL EFFUSION.  Given the      patient's recent hospitalization and redo bypass surgery, would      treat as hospital-acquired pneumonia.  Will change antibiotic to      cefepime and vancomycin.  Blood cultures have been drawn.  Sputum      will be sent for Gram-stain and culture.  Will nebulize with      Xopenex and Atrovent.  Vicodin 1-2 p.o. q.4 h will be given for      pain.  2. SINUS TACHYCARDIA.  Likely secondary to infection and fever.  The      patient does have a history of SVT.  Will resume beta blocker.      Will give Lopressor 5 mg IV x1 now (heart rate in the 140s).  3. DIABETES MELLITUS.  Will resume Actos and add sliding-scale      insulin.  4. POSTOPERATIVE ANEMIA.  Will check anemia panels and stool      Hemocults. Will start on iron supplement.  5. BRONCHOSPASTIC PULMONARY DISEASE.  Will nebulize __________  p.r.n.  6. CORONARY ARTERY DISEASE.  Status post redo coronary artery bypass      grafting/ischemic cardiomyopathy with ejection fraction of 40%.      Will check BNP. 7. HISTORY OF VENTRICULAR TACHYCARDIA.  Status post ICD implant.       Mobolaji B. Corky Downs, M.D.  Electronically Signed     MBB/MEDQ  D:  01/20/2007  T:  01/21/2007  Job:  [id number]  cc:   [place], [place] [address]. Madelon Lips,  Peter M. Swaziland, M.D.  Kerin Perna, M.D.

## 2011-04-29 NOTE — H&P (Signed)
NAMEGERRALD, BASU               ACCOUNT NO.:  [id number]   MEDICAL RECORD NO.:  [id number]          PATIENT TYPE:  OIB   LOCATION:  2853                         FACILITY:  [facility]   PHYSICIAN:  Peter M. Swaziland, M.D.  DATE OF BIRTH:  07-Jul-1946   DATE OF ADMISSION:  12/08/2006  DATE OF DISCHARGE:                              HISTORY & PHYSICAL   HISTORY OF PRESENT ILLNESS:  Mr. Lema is a 65 year old white male with  history of coronary artery disease.  He initially presented in 1997 with  syncope and recurrent ventricular tachycardia associated with unstable  angina.  He was found to have severe three-vessel obstructive coronary  artery disease.  He subsequently underwent coronary artery bypass  surgery by Dr. Kathlee Nations Trigt.  He had a defibrillator placed for  treatment of his ventricular tachycardia.  He had depressed ejection  fraction of 20-25% at that time.  Subsequently, he has done very well  from a cardiac standpoint.  He had been evaluated with stress nuclear  studies in 2001 and again in 2003.  His evaluation in 2003 showed no  significant ischemia with a fixed lateral wall defect and ejection  fraction of 51%.  However, recently he has been having increasing  symptoms of atypical anterior chest pain and increasing dyspnea.  His  dyspnea is both with exertion and at rest.  He does have a history of  bronchospastic pulmonary disease.  He subsequently underwent an  adenosine-Cardiolite study on November 29, 2006.  This demonstrated a  large area of apical infarct with peri-infarct ischemia and ejection  fraction decreased to 24%.  Given these significant changes on his  Cardiolite images and decreased LV function, it was recommended he  undergo repeat cardiac catheterization.   PAST MEDICAL HISTORY:  1. Coronary artery disease status post CABG in 1997.  2. History of ischemic cardiomyopathy.  3. History of ventricular tachycardia status post ICD implant.  4. History of  supraventricular tachycardia treated with chronic beta      blocker therapy.  5. Hyperlipidemia.  6. Diabetes mellitus type 2.  7. Obesity.  8. Bronchospastic pulmonary disease.   CURRENT MEDICATIONS:  1. Include aspirin 325 mg per day.  2. Lipitor 40 mg per day.  3. Benicar 20 mg per day.  4. Actos 30 mg per day.  5. Prilosec 20 mg per day.  6. Toprol XL 100 mg per day.  7. Combivent inhaler p.r.n.   He has no known allergies.  In addition, the patient does have a history  obstructive sleep apnea but has been on no therapy for that.  He also  has a history of gastroesophageal reflux disease.   SOCIAL HISTORY:  The patient works as a Curator.  He denies alcohol  use.  He quit smoking in 1997 but had been a chronic smoker prior to  that.   FAMILY HISTORY:  Noncontributory.   REVIEW OF SYSTEMS:  Are as noted in HPI.  He does have a history of  sleep apnea.  He has had increasing dyspnea and orthopnea.   PHYSICAL EXAMINATION:  The patient  is a pleasant white male in no  distress.  His weight is 252, blood pressure is 134/80, pulse is 80 and  regular.  His respirations are normal.  His HEENT exam is normocephalic,  atraumatic.  His pupils are equal, round and reactive.  He has a  disconjugate gaze.  His sclerae are clear.  Oropharynx is clear.  Neck  is supple without JVD, adenopathy, thyromegaly or bruits.  Lungs reveal  scant scattered wheezes.  Cardiac exam reveals regular rate and rhythm  without gallop, murmur, rub or click.  His ICD site has healed well.  Abdomen is obese, soft, nontender without masses or bruits.  Extremities  are without edema.  Pulses are 2+ and symmetric.  Neurologic exam is  nonfocal.   LABORATORY DATA:  His BNP level is 283.  Coags were normal.  Chemistry  show glucose of 106, BUN 15, creatinine 1.2, potassium 5.2.  Other  electrolytes were normal except for calcium 10.3, alkaline phosphatase  is 133.  Cholesterol is 170 with LDL 95, HDL is 31 and  triglycerides  222.  CBC is normal.   IMPRESSION:  1. Chest pain and increasing symptoms of dyspnea:  Need to rule out      recurrent coronary ischemia since the patient is now 10 years      status post coronary artery bypass graft.  Symptoms may be also      related to increased congestive heart failure with ischemic      cardiomyopathy and worsening left ventricular function.  He also      has complained of bronchospastic pulmonary disease and sleep apnea.  2. Severe ischemic cardiomyopathy.  3. 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  4. Diabetes mellitus type 2.  5. Hypertension.  6. Obstructive sleep apnea.  7. Dyslipidemia.   PLAN:  We will proceed with right and left heart catheterization,  coronary angiography.           ______________________________  Peter M. Swaziland, M.D.     PMJ/MEDQ  D:  12/07/2006  T:  12/07/2006  Job:  [id number]   cc:   Carlis Stable, M.D.

## 2011-04-29 NOTE — Cardiovascular Report (Signed)
Walter French, Walter French               ACCOUNT NO.:  [id number]   MEDICAL RECORD NO.:  [id number]          PATIENT TYPE:  OIB   LOCATION:  2853                         FACILITY:  [facility]   PHYSICIAN:  Peter M. Swaziland, M.D.  DATE OF BIRTH:  1945-12-26   DATE OF PROCEDURE:  12/08/2006  DATE OF DISCHARGE:                            CARDIAC CATHETERIZATION   INDICATION FOR PROCEDURE:  A 65 year old white male with history of  coronary disease status post coronary artery bypass surgery in 1997.  He  had three vein grafts placed at that time.  Cardiac catheterization in  2002 showed all his grafts were patent.  He presents now with increasing  symptoms of dyspnea and chest pain.  Adenosine-Cardiolite study showed a  fixed apical defect with some peri-infarct ischemia.  His ejection  fraction was 24%.   PROCEDURE:  Right and left heart catheterization, coronary and left  ventricular angiography, saphenous vein graft angiography x3.   ACCESS:  Via the right femoral artery and vein using standard Seldinger  technique.   EQUIPMENT:  A 6-French 4 cm right and left Judkins catheters, 6-French  pigtail catheter, 6-French arterial sheath, 8-French venous sheath, 7-  French balloon tip Swan-Ganz catheter.   CONTRAST:  Omnipaque 110 mL.   MEDICATIONS:  Versed 2 mg IV.   LOCAL ANESTHESIA:  One percent Xylocaine.   HEMODYNAMIC DATA:  Right atrial pressure is 19/19 with a mean of 17  mmHg.  Right ventricular pressure is 44 with an EDP of 20 mmHg.  Pulmonary artery pressure is 45/23 with a mean of 35 mmHg.  Pulmonary  capillary wedge pressure is 27/25 with a mean of 24 mmHg.  Aortic  pressure is 124/75 with a mean of 97 mmHg.  Left ventricle pressure is  127 with an EDP of 27 mmHg.  There is no significant mitral or aortic  valve gradient.  There is equalization of diastolic pressures in the  right ventricle and left ventricle but there is not a typical dip and  plateau finding.  Cardiac output both  by Fick and thermodilution is 4.5  liters per minute with an index of 1.91.   ANGIOGRAPHIC DATA:  The left coronary artery arises and distributes  normally.  The left main coronary artery is without significant disease.   The left anterior descending artery is a large vessel supplying the  anterior wall.  It has a very long segmental stenosis in the midvessel  up to 90-95%, this follows the first septal perforator and first  diagonal branch.  There is some retrograde filling of the vein graft to  the LAD and this vein graft appears to be diffusely diseased.   The left circumflex coronary is occluded proximally.  There is faint  left-to-left collaterals to a distal obtuse marginal vessel.   The native right coronary artery is occluded proximally.   The saphenous vein graft to the PDA is patent.  There is approximately  30% narrowing in the mid-vein graft.  It inserts into the PDA and  retrograde fills the distal right coronary artery and posterolateral  branch.  Following the insertion  site, there does appear to be an 80%  stenosis in the PDA.   The saphenous vein graft to the obtuse marginal vessel is occluded.   The saphenous vein graft to the LAD is occluded with faint bridging  collaterals.   The left subclavian and left internal mammary artery are widely patent.   IMPRESSION:  1. Severe three-vessel obstructive atherosclerotic coronary artery      disease.  2. Occluded saphenous vein graft to the left anterior descending and      occluded saphenous vein graft to the obtuse marginal vessel.  3. Patent saphenous vein graft to the posterior descending artery with      at least moderate disease following the insertion of the graft into      the posterior descending artery.  4. Moderate left ventricular dysfunction.  5. Mild pulmonary hypertension.   ADDENDUM:  Left ventricular angiography was performed in the RAO view.  This demonstrated focal apical dyskinesia.  There was  inferior wall  hypokinesia with overall moderate left ventricular dysfunction and  ejection fraction of 40%.  There did not appear to be significant mitral  insufficiency.           ______________________________  Peter M. Swaziland, M.D.     PMJ/MEDQ  D:  12/08/2006  T:  12/08/2006  Job:  [id number]   cc:   Kerin Perna, M.D.  Duke Salvia, MD, [facility]

## 2011-05-17 ENCOUNTER — Encounter: Payer: Self-pay | Admitting: Pulmonary Disease

## 2011-05-23 ENCOUNTER — Other Ambulatory Visit: Payer: Self-pay | Admitting: Cardiology

## 2011-05-23 MED ORDER — ATORVASTATIN CALCIUM 40 MG PO TABS: 40.0000 mg | ORAL_TABLET | Freq: Every day | ORAL | Status: DC

## 2011-05-23 NOTE — Telephone Encounter (Signed)
Med refill

## 2011-05-27 ENCOUNTER — Other Ambulatory Visit: Payer: Self-pay | Admitting: *Deleted

## 2011-05-27 MED ORDER — VALSARTAN 160 MG PO TABS: 160.0000 mg | ORAL_TABLET | Freq: Every day | ORAL | Status: DC

## 2011-06-09 ENCOUNTER — Telehealth: Payer: Self-pay | Admitting: Cardiology

## 2011-06-09 ENCOUNTER — Ambulatory Visit (INDEPENDENT_AMBULATORY_CARE_PROVIDER_SITE_OTHER): Payer: Medicare Other | Admitting: *Deleted

## 2011-06-09 DIAGNOSIS — I428 Other cardiomyopathies: Secondary | ICD-10-CM

## 2011-06-09 NOTE — Telephone Encounter (Signed)
Called in concerned about his defibrillator. He said that it had gone off several times this morning and was wondering if he should be concernaed about that. Please call back. I have pulled his chart.

## 2011-06-09 NOTE — Progress Notes (Signed)
icd checked in device clinic today./ rov July 6 with Dr. Graciela Husbands.

## 2011-06-09 NOTE — Telephone Encounter (Signed)
Called stating that his ICD has "beep" this AM and wanted to know what to do. Advised that he call Dr. Odessa Fleming office. States it did not "shock" him. He will call them.

## 2011-06-17 ENCOUNTER — Ambulatory Visit (INDEPENDENT_AMBULATORY_CARE_PROVIDER_SITE_OTHER): Payer: Medicare Other | Admitting: Internal Medicine

## 2011-06-17 ENCOUNTER — Encounter: Payer: Self-pay | Admitting: Internal Medicine

## 2011-06-17 DIAGNOSIS — I2589 Other forms of chronic ischemic heart disease: Secondary | ICD-10-CM

## 2011-06-17 DIAGNOSIS — I472 Ventricular tachycardia: Secondary | ICD-10-CM

## 2011-06-17 DIAGNOSIS — R0609 Other forms of dyspnea: Secondary | ICD-10-CM

## 2011-06-17 DIAGNOSIS — Z95810 Presence of automatic (implantable) cardiac defibrillator: Secondary | ICD-10-CM

## 2011-06-17 DIAGNOSIS — R0602 Shortness of breath: Secondary | ICD-10-CM

## 2011-06-17 DIAGNOSIS — R0600 Dyspnea, unspecified: Secondary | ICD-10-CM

## 2011-06-17 DIAGNOSIS — I2510 Atherosclerotic heart disease of native coronary artery without angina pectoris: Secondary | ICD-10-CM | POA: Insufficient documentation

## 2011-06-17 NOTE — Progress Notes (Signed)
HPI  Walter French is a 65 y.o. 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 He is status post ICD implantation originally implanted in 1997 and changed out subsequently  His replacement device has now reached ERI  He underwent redo CABG in 2007 and then subsequent angioplasty of the IMA anastomosis to the LAD in 2008. Ejection fraction at that time was 50%. He has not had subsequent catheterization   He has chronic modest dyspnea on exertion and this has persisted late significant improvement in daytime fatigue related to treatment of his severe sleep apnea.  He carries a diagnosis of emphysema as well as asthma. he is on high-dose beta blockers.  He has not had pelvic chest pain or shortness of breath  Past Medical History  Diagnosis Date  . Asthma   . Hyperlipidemia   . Hypertension   . Coronary heart disease   . Diabetes mellitus   . OSA (obstructive sleep apnea)     Past Surgical History  Procedure Date  . Coronary artery bypass graft   . Tonsillectomy     Current Outpatient Prescriptions  Medication Sig Dispense Refill  . albuterol (PROVENTIL,VENTOLIN) 90 MCG/ACT inhaler Inhale 2 puffs into the lungs every 4 (four) hours as needed.        [name] allopurinol (ZYLOPRIM) 300 MG tablet Take 300 mg by mouth daily.        [name] aspirin 81 MG tablet Take 81 mg by mouth daily.        [name] atorvastatin (LIPITOR) 40 MG tablet Take 1 tablet (40 mg total) by mouth daily.  30 tablet  5  . fenofibrate 160 MG tablet Take 160 mg by mouth daily.        . furosemide (LASIX) 40 MG tablet Take 1 tablet (40 mg total) by mouth daily.  30 tablet  5  . glipiZIDE (GLUCOTROL) 10 MG tablet Take 10 mg by mouth daily.        . insulin detemir (LEVEMIR) 100 UNIT/ML injection Inject into the skin as directed.        . metoprolol (TOPROL-XL) 100 MG 24 hr tablet Take  100 mg by mouth daily.        [name] olmesartan (BENICAR) 20 MG tablet Take 20 mg by mouth daily.        . Omega-3 Fatty Acids (FISH OIL) 1200 MG CAPS Take 1,200 capsules by mouth daily.        . potassium chloride SA (K-DUR,KLOR-CON) 20 MEQ tablet Take 1 tablet (20 mEq total) by mouth daily.  30 tablet  5  . rosuvastatin (CRESTOR) 20 MG tablet Take 20 mg by mouth daily.        . sitaGLIPtan (JANUVIA) 100 MG tablet Take 100 mg by mouth daily.        . valsartan (DIOVAN) 160 MG tablet Take 1 tablet (160 mg total) by mouth daily.  30 tablet  5    Allergies  Allergen Reactions  . Morphine And Related     Review of Systems negative except from HPI and PMH  Physical Exam Well developed and well nourished in no acute distress continues to mouth breathe HENT normal E scleral and icterus clear with dysconjugate gaze Neck Supple JVP flat; carotids brisk and full Clear to ausculation without wheezing Regular rate and rhythm, no significant she Soft with active bowel sounds No clubbing cyanosis and edema Alert and  oriented, grossly normal motor and sensory function Skin Warm and Dry  ECG from 2049first degree AV block and a narrow QRS  Assessment and  Plan

## 2011-06-17 NOTE — Assessment & Plan Note (Signed)
No intercurrent ventricular arrhythmias 

## 2011-06-17 NOTE — Assessment & Plan Note (Signed)
Device has reached ERI. He'll need to undergo device generator replacement. Will schedule this for after he sees Dr. Swaziland. That way his pre-change out coronary perfusion testing can be determined I. Dr. Swaziland. Specifically I want to know whether Myoview scanning which will have been tentatively scheduled or catheterization would be most appropriate.  We reviewed the potential risks benefits and alternatives. He understands and agrees to proceed

## 2011-06-17 NOTE — Patient Instructions (Addendum)
Your physician has requested that you have en exercise stress myoview after your appointment with Dr. Swaziland on 07/01/11. For further information please visit [email or web address] Please follow instruction sheet, as given.  We will set you up to see Dr. Craige Cotta back in pulmonary to evaluate your shortness of breath.  Your physician has recommended that you have a defibrillator generator replacement. Dr. Odessa Fleming nurse, Sherri Rad, will call you to set this up after your stress test is done.

## 2011-06-17 NOTE — Assessment & Plan Note (Signed)
He continues to suffer from significant dyspnea. He has near-normal left ventricular function. He does have some on revascularized coronary disease. He carries a diagnosis of emphysema and asthma and takes inhalers. I wonder whether his beta blocker may be aggravating this problem. Will have him go back to pulmonary and get their input. Cardiopulmonary stress testing may be incrementally beneficial but we will defer this until after his assessment of coronary perfusion and defibrillator generator replacement

## 2011-06-23 ENCOUNTER — Other Ambulatory Visit: Payer: Self-pay | Admitting: *Deleted

## 2011-06-23 MED ORDER — METOPROLOL SUCCINATE ER 100 MG PO TB24: 100.0000 mg | ORAL_TABLET | Freq: Every day | ORAL | Status: DC

## 2011-06-30 ENCOUNTER — Encounter: Payer: Medicare Other | Admitting: *Deleted

## 2011-07-01 ENCOUNTER — Encounter: Payer: Self-pay | Admitting: Cardiology

## 2011-07-01 ENCOUNTER — Ambulatory Visit (INDEPENDENT_AMBULATORY_CARE_PROVIDER_SITE_OTHER): Payer: Medicare Other | Admitting: Cardiology

## 2011-07-01 DIAGNOSIS — I5042 Chronic combined systolic (congestive) and diastolic (congestive) heart failure: Secondary | ICD-10-CM | POA: Insufficient documentation

## 2011-07-01 DIAGNOSIS — I509 Heart failure, unspecified: Secondary | ICD-10-CM

## 2011-07-01 DIAGNOSIS — I472 Ventricular tachycardia: Secondary | ICD-10-CM

## 2011-07-01 DIAGNOSIS — I2510 Atherosclerotic heart disease of native coronary artery without angina pectoris: Secondary | ICD-10-CM

## 2011-07-01 NOTE — Assessment & Plan Note (Signed)
His most recent evaluation with nuclear stress testing in November of 2011 showed a fixed inferior lateral scar without ischemia. Ejection fraction was 42%. He will continue with his current antianginal therapy. He is cleared to proceed with his ICD generator change out.

## 2011-07-01 NOTE — Assessment & Plan Note (Signed)
His most recent echocardiogram in November 2011 showed hypokinesis of the inferior wall and apex. Ejection fraction was estimated at 50%. He had biatrial enlargement. Ejection fraction by Myoview was somewhat lower at 42%. He has no significant bimalleolar load today. He does have chronic dyspnea which is multifactorial. Cardiac catheterization in 2008 did show normal pulmonary pressures. We will continue with his current therapy.

## 2011-07-01 NOTE — Patient Instructions (Signed)
We will cancel your stress test on Monday.  Continue your current medication.  You can proceed with change out of your defibrillator when Dr. Roney Jaffe decides.  I will see you in 6 months.

## 2011-07-01 NOTE — Progress Notes (Signed)
Walter French Date of Birth: 14-May-1946   History of Present Illness: Walter French is seen today for followup. On his recent defibrillator checkup he was at elective replacement indicator. He has had no defibrillator discharges. He denies any palpitations. He has had no chest pain. He does have chronic dyspnea on exertion. He feels that this has been somewhat worse with the hot humid weather. He does feel that overall his breathing and his energy have improved with appropriate treatment of his sleep apnea. He denies any increase in his edema.  Current Outpatient Prescriptions on File Prior to Visit  Medication Sig Dispense Refill  . albuterol (PROVENTIL,VENTOLIN) 90 MCG/ACT inhaler Inhale 2 puffs into the lungs every 4 (four) hours as needed.        [name] allopurinol (ZYLOPRIM) 300 MG tablet Take 300 mg by mouth daily.        [name] atorvastatin (LIPITOR) 40 MG tablet Take 1 tablet (40 mg total) by mouth daily.  30 tablet  5  . fenofibrate 160 MG tablet Take 160 mg by mouth daily.        . furosemide (LASIX) 40 MG tablet Take 1 tablet (40 mg total) by mouth daily.  30 tablet  5  . glipiZIDE (GLUCOTROL) 10 MG tablet Take 10 mg by mouth daily.        . insulin detemir (LEVEMIR) 100 UNIT/ML injection Inject into the skin as directed.        . metoprolol (TOPROL-XL) 100 MG 24 hr tablet Take 1 tablet (100 mg total) by mouth daily.  90 tablet  6  . olmesartan (BENICAR) 20 MG tablet Take 20 mg by mouth daily.        . Omega-3 Fatty Acids (FISH OIL) 1200 MG CAPS Take 1,200 capsules by mouth daily.        . potassium chloride SA (K-DUR,KLOR-CON) 20 MEQ tablet Take 1 tablet (20 mEq total) by mouth daily.  30 tablet  5  . rosuvastatin (CRESTOR) 20 MG tablet Take 20 mg by mouth daily.        . sitaGLIPtan (JANUVIA) 100 MG tablet Take 100 mg by mouth daily.        . valsartan (DIOVAN) 160 MG tablet Take 1 tablet (160 mg total) by mouth daily.  30 tablet  5  . aspirin 81 MG tablet Take 81 mg by mouth daily.           Allergies  Allergen Reactions  . Morphine And Related     Past Medical History  Diagnosis Date  . Asthma   . Hyperlipidemia   . Hypertension   . Coronary heart disease   . Diabetes mellitus     on insulin  . OSA (obstructive sleep apnea)   . Obesity   . CHF (congestive heart failure)   . COPD (chronic obstructive pulmonary disease)   . VT (ventricular tachycardia)   . SVT (supraventricular tachycardia)     Past Surgical History  Procedure Date  . Coronary artery bypass graft 4098,1191  . Tonsillectomy     History  Smoking status  . Former Smoker -- 2.0 packs/day for 30 years  . Types: Cigarettes  . Quit date: 12/13/1995  Smokeless tobacco  . Not on file    History  Alcohol Use: Not on file    Family History  Problem Relation Age of Onset  . Heart disease Brother   . Cancer Brother   . Breast cancer Mother     Review  of Systems: The review of systems is positive for chronic dyspnea as noted.  All other systems were reviewed and are negative.  Physical Exam: BP 130/70  Pulse 84  Ht 6' (1.829 m)  Wt 252 lb (114.306 kg)  BMI 34.18 kg/m2  SpO2 95% He is an overweight white male in no acute distress. He is normocephalic, atraumatic. He has a disconjugate gaze. Pupils are equal round and reactive. Oropharynx is clear. Neck is supple no JVD, adenopathy, thyromegaly, or bruits. Lungs are clear. He has no wheezes or rales. Cardiac exam reveals a regular rate and rhythm without gallop or click. He has a grade 2/6 systolic murmur at apex.Abdomen is soft and nontender. He has no ascites. There is no lower extremity edema. Pedal pulses are palpable. Skin is warm and dry. He is alert and oriented x3. Cranial nerves II through XII are intact. LABORATORY DATA:   Assessment / Plan:

## 2011-07-01 NOTE — Assessment & Plan Note (Signed)
Number current since his bypass surgery and 1997. He does have an ICD in place.

## 2011-07-04 ENCOUNTER — Other Ambulatory Visit (HOSPITAL_COMMUNITY): Payer: Medicare Other | Admitting: Radiology

## 2011-07-08 ENCOUNTER — Telehealth: Payer: Self-pay | Admitting: Internal Medicine

## 2011-07-08 DIAGNOSIS — I509 Heart failure, unspecified: Secondary | ICD-10-CM

## 2011-07-08 DIAGNOSIS — Z01810 Encounter for preprocedural cardiovascular examination: Secondary | ICD-10-CM

## 2011-07-08 NOTE — Telephone Encounter (Signed)
Pt calling re info on procedure

## 2011-07-08 NOTE — Telephone Encounter (Signed)
Spoke with patient and let him know I have printed Dr Elvis Coil note that his stress test was canceled and that he has the go ahead for the generator change.  Will forward to Dr Odessa Fleming nurse and let patient know it could be mid week before he hears back from her  He verbalized understanding

## 2011-07-13 NOTE — Telephone Encounter (Signed)
Status of generator change.

## 2011-07-14 ENCOUNTER — Encounter: Payer: Self-pay | Admitting: Pulmonary Disease

## 2011-07-14 ENCOUNTER — Telehealth: Payer: Self-pay | Admitting: Cardiology

## 2011-07-14 ENCOUNTER — Ambulatory Visit (INDEPENDENT_AMBULATORY_CARE_PROVIDER_SITE_OTHER): Payer: Medicare Other | Admitting: Pulmonary Disease

## 2011-07-14 ENCOUNTER — Ambulatory Visit (INDEPENDENT_AMBULATORY_CARE_PROVIDER_SITE_OTHER)
Admission: RE | Admit: 2011-07-14 | Discharge: 2011-07-14 | Disposition: A | Discharge status: Home / Self Care | Payer: Medicare Other | Source: Ambulatory Visit | Attending: Pulmonary Disease | Admitting: Pulmonary Disease

## 2011-07-14 VITALS — BP 124/60 | HR 69 | Temp 97.7°F | Ht 72.0 in | Wt 256.4 lb

## 2011-07-14 DIAGNOSIS — G4733 Obstructive sleep apnea (adult) (pediatric): Secondary | ICD-10-CM

## 2011-07-14 DIAGNOSIS — R0600 Dyspnea, unspecified: Secondary | ICD-10-CM

## 2011-07-14 DIAGNOSIS — R0989 Other specified symptoms and signs involving the circulatory and respiratory systems: Secondary | ICD-10-CM

## 2011-07-14 MED ORDER — ALBUTEROL 90 MCG/ACT IN AERS: 2.0000 | INHALATION_SPRAY | RESPIRATORY_TRACT | Status: DC | PRN

## 2011-07-14 NOTE — Telephone Encounter (Signed)
Called saying that [facility] told him he had stents and he didn't think he did. Looked in chart and had [facility] on 11/27/07. He said [facility] was trying to tell him he had stents. They are suppose to call him back so he was trying to get clarification.

## 2011-07-14 NOTE — Telephone Encounter (Signed)
Patient asked if he is has stents.  He was not aware that he has stents.

## 2011-07-14 NOTE — Telephone Encounter (Signed)
Patient states needs to be schedule for defibrillator generator change. He would like to be call soon. Patient aware will send this message to Dr. Odessa Fleming nurse, patient verbalized understanding.

## 2011-07-14 NOTE — Telephone Encounter (Signed)
Per pt call, pt wanted to schedule date and time for pt to get defibrillator changed. Please return pt call to advise/discuss.

## 2011-07-14 NOTE — Progress Notes (Signed)
  Subjective:    Patient ID: Walter French, male    DOB: 1946-10-17, 65 y.o.   MRN: [id number]  HPI CC: Steven Klein, Peter Swaziland.  65 yo male with hx of OSA, and dyspnea.  He was noted by cardiology to have increasing dyspnea.  This happens all the time with minimal exertion.  He does get occasional tightness in his chest.  He denies cough, wheeze, or sputum.  He was using proventil until he ran out of this.  He does not think proventil helped his breathing.  He does get reflux at night.  He denies any history of allergies.  He had pneumonia 3 times before.  There is no history of TB.  He denies sinus problems.  He smoked 2 packs per day for 60 years, and quit in 1997.  Echo from November 2011>>mild LVH, EF 50%, apical hypokinesis, grade 1 diastolic dysfx, PAS 32 mmHg  He has been using CPAP.  He feels this helps, and he reports compliance with therapy.    Review of Systems  HENT: Positive for trouble swallowing.   Respiratory: Positive for shortness of breath.   Musculoskeletal: Positive for joint swelling.       Objective:   Physical Exam  BP 124/60  Pulse 69  Temp(Src) 97.7 F (36.5 C) (Oral)  Ht 6' (1.829 m)  Wt 256 lb 6.4 oz (116.302 kg)  BMI 34.77 kg/m2  SpO2 92%  General - Obese HEENT - no sinus tenderness, no oral exudate, MP 3, no LAN Cardiac -s1s2 regular, no murmur, pulses equal Chest - dimished breath sounds, no wheeze/rales Abd - obese, soft, nontender Ext - no edema Neuro - normal strength, CN intact Psych - normal mood/behavior Skin - no rashes      Assessment & Plan:

## 2011-07-14 NOTE — Patient Instructions (Signed)
Chest xray today Will schedule breathing test (PFT) Albuterol two puffs as needed  Follow up in 2 to 3 weeks

## 2011-07-15 NOTE — Telephone Encounter (Signed)
I left a message I was trying to call to schedule his procedure. I stated I would call back on Tuesday when I return to the office.

## 2011-07-19 NOTE — Telephone Encounter (Signed)
Pt is scheduled for ICD generator change on 08/08/11. Instructions will be mailed to the patient per his request. He will come for labs on 08/02/11. He verbalizes understanding.

## 2011-07-20 ENCOUNTER — Telehealth: Payer: Self-pay | Admitting: Pulmonary Disease

## 2011-07-20 ENCOUNTER — Encounter: Payer: Self-pay | Admitting: Pulmonary Disease

## 2011-07-20 NOTE — Assessment & Plan Note (Signed)
Likely multifactorial.  He has extensive history of prior cigarette use.  He could have obstructive lung disease.  He however has not felt improvement with inhaler therapy.  Will arrange for pulmonary function testing and chest xray to further assess.  He has coronary artery disease, hypertension, and diastolic dysfunction.  These are being managed closely by cardiology.  He is obese, and has a fairly sedentary lifestyle.  Much of his current problems are likely related to deconditioning.    There is no evidence from history or exam to suggest thrombo-embolic disease.  Will defer imaging studies for this at present.  He may require cardio-pulmonary stress testing if his dyspnea evaluation otherwise is inconclusive.

## 2011-07-20 NOTE — Telephone Encounter (Signed)
Advised pt of cxr results and he verbalized understanding and had no questions

## 2011-07-20 NOTE — Assessment & Plan Note (Signed)
He reports compliance with therapy, and feels benefit from using CPAP.  Will continue CPAP 9 cm H2O.

## 2011-07-20 NOTE — Telephone Encounter (Signed)
CXR 07/14/11 reviewed>>Cardiomegaly w/o failure, chronic bronchitic changes  Will have my nurse inform pt that CXR did not show any acute findings to explain his breathing trouble.  Will discuss further at next visit.

## 2011-07-25 ENCOUNTER — Encounter: Payer: Self-pay | Admitting: *Deleted

## 2011-08-02 ENCOUNTER — Other Ambulatory Visit: Payer: Medicare Other | Admitting: *Deleted

## 2011-08-02 ENCOUNTER — Encounter: Payer: Self-pay | Admitting: Internal Medicine

## 2011-08-03 ENCOUNTER — Ambulatory Visit: Payer: Medicare Other | Admitting: Pulmonary Disease

## 2011-08-08 ENCOUNTER — Ambulatory Visit (HOSPITAL_COMMUNITY)
Admission: RE | Admit: 2011-08-08 | Discharge: 2011-08-08 | Disposition: A | Discharge status: Home / Self Care | Payer: Medicare Other | Source: Ambulatory Visit | Attending: Internal Medicine | Admitting: Internal Medicine

## 2011-08-08 DIAGNOSIS — I2589 Other forms of chronic ischemic heart disease: Secondary | ICD-10-CM | POA: Insufficient documentation

## 2011-08-08 DIAGNOSIS — Z4502 Encounter for adjustment and management of automatic implantable cardiac defibrillator: Secondary | ICD-10-CM | POA: Insufficient documentation

## 2011-08-08 DIAGNOSIS — T82198A Other mechanical complication of other cardiac electronic device, initial encounter: Secondary | ICD-10-CM

## 2011-08-08 LAB — SURGICAL PCR SCREEN: MRSA, PCR: NEGATIVE

## 2011-08-08 LAB — GLUCOSE, CAPILLARY
Glucose-Capillary: 178 mg/dL — ABNORMAL HIGH (ref 70–99)
Glucose-Capillary: 193 mg/dL — ABNORMAL HIGH (ref 70–99)

## 2011-08-08 NOTE — Op Note (Signed)
  NAMELEGEND, TUMMINELLO NO.:  [id number]  MEDICAL RECORD NO.:  [id number]  LOCATION:  [facility]                         FACILITY:  [facility]  PHYSICIAN:  Duke Salvia, MD, FACCDATE OF BIRTH:  Apr 15, 1946  DATE OF PROCEDURE:  08/08/2011 DATE OF DISCHARGE:                              OPERATIVE REPORT   PREOPERATIVE DIAGNOSES:  Ischemic cardiomyopathy, previous ventricular tachycardia, status post ICD now at [facility].  POSTOPERATIVE DIAGNOSES:  Ischemic cardiomyopathy, previous ventricular tachycardia, status post ICD now at [facility]; erosion of the insulation of the rate sense portion of the defibrillator lead.  Following obtaining informed consent, the patient was brought to the electrophysiology laboratory and placed on the fluoroscopic table in supine position after routine prep and drape of the left upper chest, lidocaine was infiltrated just along the line of the previous incision and carried down to the layer of the device pocket using sharp dissection with electrocautery.  The pocket was opened.  The device was explanted.  The pocket was extended medially.  Interrogation of the previously implanted 6932 ICD lead, serial number [id number] demonstrated amplitude of 24.7 with impedance of 555 and threshold 0.5.  Current threshold 1.0 mA.  There was insulation at the rate sense portions and so medical adhesive was placed along the rate sense lead down past the yoke.  The lead was then attached to Medtronic [id number] device, serial number is [id number].  Through the device, the R- wave was greater than 20 with pace impendence of 437, threshold of 1 volt at 0.4.  High-voltage impedance was 68 ohms.  Defibrillation threshold testing was undertaken.  Ventricular fibrillation was induced via the T-wave shock.  After total duration of 8 seconds, 20 joules shock was delivered through a measured resistance of 63 ohms terminating the ventricular fibrillation and restoring  sinus rhythm.  The device was implanted.  The pocket was copiously irrigated with antibiotic containing saline solution.  Hemostasis was assured. The lead and pulse generator were secured to the prepectoral fascia and the wound was closed in three layers in normal fashion.  The wound was washed, dried, and a benzoin and Steri-Strip dressing was applied. Needle counts, sponge counts, and instrument counts were correct at the end of the procedure according to the staff.  The patient tolerated the procedure without apparent complication.     Duke Salvia, MD, [facility]     SCK/MEDQ  D:  08/08/2011  T:  08/08/2011  Job:  [id number]  Electronically Signed by Sherryl Manges MD [facility] on 08/08/2011 01:55:14 PM

## 2011-08-18 ENCOUNTER — Ambulatory Visit: Payer: Medicare Other | Admitting: Pulmonary Disease

## 2011-08-18 ENCOUNTER — Ambulatory Visit (INDEPENDENT_AMBULATORY_CARE_PROVIDER_SITE_OTHER): Payer: Medicare Other | Admitting: *Deleted

## 2011-08-18 ENCOUNTER — Encounter: Payer: Self-pay | Admitting: Internal Medicine

## 2011-08-18 DIAGNOSIS — I472 Ventricular tachycardia: Secondary | ICD-10-CM

## 2011-08-18 DIAGNOSIS — Z95810 Presence of automatic (implantable) cardiac defibrillator: Secondary | ICD-10-CM

## 2011-08-18 DIAGNOSIS — I509 Heart failure, unspecified: Secondary | ICD-10-CM

## 2011-08-18 LAB — ICD DEVICE OBSERVATION
BATTERY VOLTAGE: 3.22 V
DEV-0020ICD: NEGATIVE
RV LEAD THRESHOLD: 1 V

## 2011-08-18 NOTE — Progress Notes (Signed)
Wound check-ICD 

## 2011-08-22 ENCOUNTER — Other Ambulatory Visit: Payer: Self-pay | Admitting: *Deleted

## 2011-08-22 MED ORDER — RANITIDINE HCL 300 MG PO TABS: 300.0000 mg | ORAL_TABLET | Freq: Every day | ORAL | Status: DC

## 2011-08-22 NOTE — Telephone Encounter (Signed)
escribe medication per fax request  

## 2011-09-08 ENCOUNTER — Ambulatory Visit: Payer: Medicare Other | Admitting: Pulmonary Disease

## 2011-09-16 LAB — BASIC METABOLIC PANEL
CO2: 26
CO2: 26
Calcium: 9.1
Chloride: 105
Glucose, Bld: 105 — ABNORMAL HIGH
Glucose, Bld: 112 — ABNORMAL HIGH
Potassium: 4.2
Potassium: 4.6
Sodium: 137
Sodium: 137

## 2011-09-16 LAB — CBC
HCT: 37.9 — ABNORMAL LOW
Hemoglobin: 13.1
Hemoglobin: 13.4
MCHC: 34.6
MCV: 86.3
RBC: 4.47
RDW: 14
RDW: 14.3

## 2011-09-16 LAB — CARDIAC PANEL(CRET KIN+CKTOT+MB+TROPI)
CK, MB: 2.1
Total CK: 86

## 2011-09-19 LAB — POCT I-STAT 3, ART BLOOD GAS (G3+)
Bicarbonate: 24.4 — ABNORMAL HIGH
Operator id: 256671
pCO2 arterial: 36.8
pO2, Arterial: 69 — ABNORMAL LOW

## 2011-09-19 LAB — POCT I-STAT 3, VENOUS BLOOD GAS (G3P V)
Bicarbonate: 22.8
Operator id: 256671
TCO2: 24
pCO2, Ven: 41.8 — ABNORMAL LOW
pH, Ven: 7.344 — ABNORMAL HIGH

## 2011-09-23 LAB — I-STAT 8, (EC8 V) (CONVERTED LAB)
BUN: 19
Bicarbonate: 24.7 — ABNORMAL HIGH
Chloride: 107
Glucose, Bld: 131 — ABNORMAL HIGH
HCT: 42
Hemoglobin: 14.3
Operator id: 272551
Potassium: 3.9
Sodium: 141

## 2011-09-23 LAB — CBC
HCT: 39.1
MCHC: 34
Platelets: 106 — ABNORMAL LOW
RDW: 15 — ABNORMAL HIGH

## 2011-09-23 LAB — POCT CARDIAC MARKERS
Myoglobin, poc: 102
Operator id: 272551

## 2011-09-23 LAB — POCT I-STAT CREATININE
Creatinine, Ser: 1.6 — ABNORMAL HIGH
Operator id: 272551

## 2011-10-05 ENCOUNTER — Ambulatory Visit (INDEPENDENT_AMBULATORY_CARE_PROVIDER_SITE_OTHER): Payer: Medicare Other | Admitting: Pulmonary Disease

## 2011-10-05 ENCOUNTER — Encounter: Payer: Self-pay | Admitting: Pulmonary Disease

## 2011-10-05 DIAGNOSIS — R0600 Dyspnea, unspecified: Secondary | ICD-10-CM

## 2011-10-05 DIAGNOSIS — R0609 Other forms of dyspnea: Secondary | ICD-10-CM

## 2011-10-05 DIAGNOSIS — Z23 Encounter for immunization: Secondary | ICD-10-CM

## 2011-10-05 DIAGNOSIS — R0989 Other specified symptoms and signs involving the circulatory and respiratory systems: Secondary | ICD-10-CM

## 2011-10-05 DIAGNOSIS — G4733 Obstructive sleep apnea (adult) (pediatric): Secondary | ICD-10-CM

## 2011-10-05 LAB — PULMONARY FUNCTION TEST

## 2011-10-05 NOTE — Progress Notes (Signed)
Chief Complaint  Patient presents with  . Shortness of Breath    Breathing is the same  . Sleep Apnea    Using CPAP every night, c/o mask moving at night, feels rested in the mornings    History of Present Illness:  65 yo male with hx of OSA, and dyspnea.  He has not noticed any difference with his breathing from using albuterol.  In fact this makes him feel choked.  He denies cough, wheeze, or sputum.  He does notice trouble with his breathing after his heart starts racing.  This has not happened recently.  He denies chest pain or leg swelling.  He uses his CPAP for 6 to 8 hours per night.  He sleeps better and feels better during the day.  Sometimes his mask comes off at night.   Past Medical History  Diagnosis Date  . Hyperlipidemia   . Hypertension   . Coronary heart disease   . Diabetes mellitus     on insulin  . OSA (obstructive sleep apnea)   . Obesity   . CHF (congestive heart failure)   . VT (ventricular tachycardia)   . SVT (supraventricular tachycardia)     Past Surgical History  Procedure Date  . Coronary artery bypass graft 1610,9604  . Tonsillectomy   . Right hand surgery     Current Outpatient Prescriptions on File Prior to Visit  Medication Sig Dispense Refill  . allopurinol (ZYLOPRIM) 300 MG tablet Take 300 mg by mouth daily.        [name] aspirin 81 MG tablet Take 81 mg by mouth daily.        [name] atorvastatin (LIPITOR) 40 MG tablet Take 1 tablet (40 mg total) by mouth daily.  30 tablet  5  . fenofibrate 160 MG tablet Take 160 mg by mouth daily.        [name] glipiZIDE (GLUCOTROL) 10 MG tablet Take 10 mg by mouth daily.        [name] HYDROcodone-acetaminophen (VICODIN) 5-500 MG per tablet As needed      . insulin detemir (LEVEMIR) 100 UNIT/ML injection Inject 55 Units into the skin 2 (two) times daily.       . metoprolol (TOPROL-XL) 100 MG 24 hr tablet Take 1 tablet (100 mg total) by mouth daily.  90 tablet  6  . olmesartan (BENICAR) 20 MG tablet Take 20 mg by mouth  daily.        . Omega-3 Fatty Acids (FISH OIL) 1200 MG CAPS Take 1,200 capsules by mouth daily.        . potassium chloride SA (K-DUR,KLOR-CON) 20 MEQ tablet Take 1 tablet (20 mEq total) by mouth daily.  30 tablet  5  . ranitidine (ZANTAC) 300 MG tablet Take 1 tablet (300 mg total) by mouth daily. As needed  30 tablet  5  . rosuvastatin (CRESTOR) 20 MG tablet Take 20 mg by mouth daily.        . sitaGLIPtan (JANUVIA) 100 MG tablet Take 100 mg by mouth daily.        . valsartan (DIOVAN) 160 MG tablet Take 1 tablet (160 mg total) by mouth daily.  30 tablet  5  . DISCONTD: furosemide (LASIX) 40 MG tablet Take 1 tablet (40 mg total) by mouth daily.  30 tablet  5    Allergies  Allergen Reactions  . Morphine And Related     Physical Exam:  Blood pressure 120/68, pulse 75, temperature 97.8 F (36.6 C), temperature source  Oral, height 6' (1.829 m), weight 252 lb (114.306 kg), SpO2 95.00%.  General - Obese  HEENT - no sinus tenderness, no oral exudate, MP 3, no LAN  Cardiac -s1s2 regular, no murmur, pulses equal  Chest - dimished breath sounds, no wheeze/rales  Abd - obese, soft, nontender  Ext - no edema  Neuro - normal strength, CN intact  Psych - normal mood/behavior  Skin - no rashes  CHEST - 2 VIEW 07/20/11: Comparison: 01/01/2008.  Findings: Unchanged left subclavian pacemaker apparatus. Median sternotomy for CABG. More broken sternal wires have broken since 2009. Clinically correlate for history of revision or sternal dehiscence. Minimal separation of the wires makes dehiscence highly unlikely.  The cardiopericardial silhouette is enlarged. There is no failure.  Chronic bronchitic changes are present. No airspace disease. No effusion.  IMPRESSION:  Cardiomegaly without failure. No acute cardiopulmonary disease.  Stable pacemaker.  PFT 10/05/11>>FEV1 2.48(73%), FEV1% 74, TLC 6.42(90%), DLCO 63% correct for lung volumes, no BD  Assessment/Plan:  Dyspnea Likely  multifactorial.  No evidence for obstructive lung disease.  Will d/c albuterol.  He has coronary artery disease, hypertension, and diastolic dysfunction.  These are being managed closely by cardiology.  He is obese, and has a fairly sedentary lifestyle.  Much of his current problems are likely related to deconditioning.    No additional pulmonary testing is needed at present.    OBSTRUCTIVE SLEEP APNEA He reports compliance and benefit from CPAP therapy.     Outpatient Encounter Prescriptions as of 10/05/2011  Medication Sig Dispense Refill  . allopurinol (ZYLOPRIM) 300 MG tablet Take 300 mg by mouth daily.        [name] aspirin 81 MG tablet Take 81 mg by mouth daily.        [name] atorvastatin (LIPITOR) 40 MG tablet Take 1 tablet (40 mg total) by mouth daily.  30 tablet  5  . fenofibrate 160 MG tablet Take 160 mg by mouth daily.        . furosemide (LASIX) 40 MG tablet Take 40 mg by mouth daily as needed.        [name] glipiZIDE (GLUCOTROL) 10 MG tablet Take 10 mg by mouth daily.        [name] HYDROcodone-acetaminophen (VICODIN) 5-500 MG per tablet As needed      . insulin detemir (LEVEMIR) 100 UNIT/ML injection Inject 55 Units into the skin 2 (two) times daily.       . metoprolol (TOPROL-XL) 100 MG 24 hr tablet Take 1 tablet (100 mg total) by mouth daily.  90 tablet  6  . olmesartan (BENICAR) 20 MG tablet Take 20 mg by mouth daily.        . Omega-3 Fatty Acids (FISH OIL) 1200 MG CAPS Take 1,200 capsules by mouth daily.        . potassium chloride SA (K-DUR,KLOR-CON) 20 MEQ tablet Take 1 tablet (20 mEq total) by mouth daily.  30 tablet  5  . ranitidine (ZANTAC) 300 MG tablet Take 1 tablet (300 mg total) by mouth daily. As needed  30 tablet  5  . rosuvastatin (CRESTOR) 20 MG tablet Take 20 mg by mouth daily.        . sitaGLIPtan (JANUVIA) 100 MG tablet Take 100 mg by mouth daily.        . valsartan (DIOVAN) 160 MG tablet Take 1 tablet (160 mg total) by mouth daily.  30 tablet  5  . DISCONTD:  furosemide (LASIX) 40 MG tablet Take 1 tablet (40  mg total) by mouth daily.  30 tablet  5  . DISCONTD: albuterol (PROVENTIL,VENTOLIN) 90 MCG/ACT inhaler Inhale 2 puffs into the lungs every 4 (four) hours as needed.  17 g  5    Lynise Porr 10/05/2011, 2:39 PM

## 2011-10-05 NOTE — Progress Notes (Signed)
PFT done today. 

## 2011-10-05 NOTE — Patient Instructions (Signed)
Flu shot today Follow up in 6 months 

## 2011-10-05 NOTE — Progress Notes (Signed)
Addended by: Julaine Hua on: 10/05/2011 03:56 PM   Modules accepted: Orders

## 2011-10-05 NOTE — Assessment & Plan Note (Signed)
Likely multifactorial.  No evidence for obstructive lung disease.  Will d/c albuterol.  He has coronary artery disease, hypertension, and diastolic dysfunction.  These are being managed closely by cardiology.  He is obese, and has a fairly sedentary lifestyle.  Much of his current problems are likely related to deconditioning.    No additional pulmonary testing is needed at present.

## 2011-10-05 NOTE — Assessment & Plan Note (Signed)
He reports compliance and benefit from CPAP therapy.

## 2011-10-10 ENCOUNTER — Other Ambulatory Visit: Payer: Self-pay | Admitting: Cardiology

## 2011-10-12 ENCOUNTER — Encounter: Payer: Self-pay | Admitting: Pulmonary Disease

## 2011-11-28 ENCOUNTER — Encounter: Payer: Self-pay | Admitting: Internal Medicine

## 2011-11-28 ENCOUNTER — Ambulatory Visit (INDEPENDENT_AMBULATORY_CARE_PROVIDER_SITE_OTHER): Payer: Medicare Other | Admitting: Internal Medicine

## 2011-11-28 DIAGNOSIS — I509 Heart failure, unspecified: Secondary | ICD-10-CM

## 2011-11-28 DIAGNOSIS — Z95810 Presence of automatic (implantable) cardiac defibrillator: Secondary | ICD-10-CM

## 2011-11-28 DIAGNOSIS — I2510 Atherosclerotic heart disease of native coronary artery without angina pectoris: Secondary | ICD-10-CM

## 2011-11-28 DIAGNOSIS — I472 Ventricular tachycardia: Secondary | ICD-10-CM

## 2011-11-28 LAB — ICD DEVICE OBSERVATION
BATTERY VOLTAGE: 3.2105 V
BRDY-0002RV: 40 {beats}/min
CHARGE TIME: 3.653 s
FVT: 0
RV LEAD AMPLITUDE: 27.875 mv
RV LEAD THRESHOLD: 1 V
TOT-0001: 1
TZAT-0001FASTVT: 1
TZAT-0001SLOWVT: 1
TZAT-0001SLOWVT: 2
TZAT-0004SLOWVT: 8
TZAT-0012FASTVT: 200 ms
TZAT-0012SLOWVT: 200 ms
TZAT-0018FASTVT: NEGATIVE
TZAT-0019SLOWVT: 8 V
TZAT-0019SLOWVT: 8 V
TZAT-0020SLOWVT: 1.5 ms
TZAT-0020SLOWVT: 1.5 ms
TZON-0003VSLOWVT: 400 ms
TZST-0001FASTVT: 4
TZST-0001FASTVT: 5
TZST-0001FASTVT: 6
TZST-0001SLOWVT: 3
TZST-0001SLOWVT: 5
TZST-0002FASTVT: NEGATIVE
TZST-0002FASTVT: NEGATIVE
TZST-0003SLOWVT: 35 J
TZST-0003SLOWVT: 35 J
VENTRICULAR PACING ICD: 0.01 pct
VF: 0

## 2011-11-28 NOTE — Progress Notes (Signed)
HPI  Walter French is a 65 y.o. 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 He is status post ICD implantation originally implanted in 1997 and changed out subsequently   He underwent generator replacement aug 2012.    He underwent redo CABG in 2007 and then subsequent angioplasty of the IMA anastomosis to the LAD in 2008. Ejection fraction at that time was 50%. He has not had subsequent catheterization   That having any significant chest pain or shortness of breath beyond the ordinary. He is concerned about knots in his stomach.  Past Medical History  Diagnosis Date  . Hyperlipidemia   . Hypertension   . Coronary heart disease   . Diabetes mellitus     on insulin  . OSA (obstructive sleep apnea)   . Obesity   . CHF (congestive heart failure)   . VT (ventricular tachycardia)   . SVT (supraventricular tachycardia)     Past Surgical History  Procedure Date  . Coronary artery bypass graft 8756,4332  . Tonsillectomy   . Right hand surgery     Current Outpatient Prescriptions  Medication Sig Dispense Refill  . allopurinol (ZYLOPRIM) 300 MG tablet Take 300 mg by mouth daily.        [name] aspirin 81 MG tablet Take 81 mg by mouth daily.        [name] atorvastatin (LIPITOR) 40 MG tablet Take 1 tablet (40 mg total) by mouth daily.  30 tablet  5  . fenofibrate 160 MG tablet Take 160 mg by mouth daily.        . furosemide (LASIX) 40 MG tablet Take 40 mg by mouth daily as needed.        [name] glipiZIDE (GLUCOTROL) 10 MG tablet Take 10 mg by mouth daily.        [name] HYDROcodone-acetaminophen (VICODIN) 5-500 MG per tablet As needed      . insulin detemir (LEVEMIR) 100 UNIT/ML injection Inject 55 Units into the skin 2 (two) times daily.       [name] KLOR-CON M20 20 MEQ tablet take 1 tablet by mouth once daily  30 tablet  5  . metoprolol (TOPROL-XL) 100 MG 24  hr tablet Take 1 tablet (100 mg total) by mouth daily.  90 tablet  6  . olmesartan (BENICAR) 20 MG tablet Take 20 mg by mouth daily.        . Omega-3 Fatty Acids (FISH OIL) 1200 MG CAPS Take 1,200 capsules by mouth daily.        . ranitidine (ZANTAC) 300 MG tablet Take 1 tablet (300 mg total) by mouth daily. As needed  30 tablet  5  . rosuvastatin (CRESTOR) 20 MG tablet Take 20 mg by mouth daily.        . sitaGLIPtan (JANUVIA) 100 MG tablet Take 100 mg by mouth daily.        . valsartan (DIOVAN) 160 MG tablet Take 1 tablet (160 mg total) by mouth daily.  30 tablet  5  . DISCONTD: furosemide (LASIX) 40 MG tablet Take 1 tablet (40 mg total) by mouth daily.  30 tablet  5    Allergies  Allergen Reactions  . Morphine And Related     Review of Systems negative except from HPI and PMH  Physical Exam Well developed and well nourished in no acute distress HENT normal E scleral and icterus clear Neck Supple Device pocket was  well-healed JVP flat; carotids brisk and full Clear to ausculation Regular rate and rhythm, no murmurs gallops or rub Soft with active bowel sounds; firm 2-3 cm areas in his abdomen very close to the surface No clubbing cyanosis none Edema Alert and oriented, grossly normal motor and sensory function Skin Warm and Dry   Assessment and  Plan

## 2011-11-28 NOTE — Assessment & Plan Note (Signed)
No intercurrent VT 

## 2011-11-28 NOTE — Patient Instructions (Signed)
Your physician wants you to follow-up in: 1 year with Dr. Klein. You will receive a reminder letter in the mail two months in advance. If you don't receive a letter, please call our office to schedule the follow-up appointment.  Your physician recommends that you continue on your current medications as directed. Please refer to the Current Medication list given to you today.  

## 2011-11-28 NOTE — Assessment & Plan Note (Signed)
[name] device was interrogated.  The information was reviewed. No changes were made in the programming.    

## 2011-11-28 NOTE — Assessment & Plan Note (Signed)
Stable. Will follow up with Dr. [place]

## 2011-12-09 ENCOUNTER — Other Ambulatory Visit: Payer: Self-pay | Admitting: *Deleted

## 2011-12-09 MED ORDER — ATORVASTATIN CALCIUM 40 MG PO TABS: 40.0000 mg | ORAL_TABLET | Freq: Every day | ORAL | Status: DC

## 2011-12-09 MED ORDER — VALSARTAN 160 MG PO TABS: 160.0000 mg | ORAL_TABLET | Freq: Every day | ORAL | Status: DC

## 2011-12-30 ENCOUNTER — Ambulatory Visit: Payer: Medicare Other | Admitting: Cardiology

## 2012-01-18 ENCOUNTER — Encounter: Payer: Self-pay | Admitting: Cardiology

## 2012-01-18 ENCOUNTER — Ambulatory Visit (INDEPENDENT_AMBULATORY_CARE_PROVIDER_SITE_OTHER): Payer: Medicare Other | Admitting: Cardiology

## 2012-01-18 VITALS — BP 120/78 | HR 83 | Wt 249.0 lb

## 2012-01-18 DIAGNOSIS — I5042 Chronic combined systolic (congestive) and diastolic (congestive) heart failure: Secondary | ICD-10-CM

## 2012-01-18 DIAGNOSIS — I2510 Atherosclerotic heart disease of native coronary artery without angina pectoris: Secondary | ICD-10-CM

## 2012-01-18 DIAGNOSIS — I509 Heart failure, unspecified: Secondary | ICD-10-CM

## 2012-01-18 DIAGNOSIS — I472 Ventricular tachycardia, unspecified: Secondary | ICD-10-CM

## 2012-01-18 DIAGNOSIS — E785 Hyperlipidemia, unspecified: Secondary | ICD-10-CM

## 2012-01-18 NOTE — Progress Notes (Signed)
Walter French Date of Birth: 1945-12-14   History of Present Illness: Walter French is seen today for followup. He reports that he is doing very well. He has had no defibrillator discharges. He denies any palpitations. He has had no chest pain. He does have chronic dyspnea on exertion.  He does feel that overall his breathing and his energy have improved with appropriate treatment of his sleep apnea. He denies any increase in his edema. He is no longer on Januvia and is now on Victosa with improvement in his diabetic control. He does complain that he gets very tired at about 8:30 PM every night. During the day he feels great.  Current Outpatient Prescriptions on File Prior to Visit  Medication Sig Dispense Refill  . allopurinol (ZYLOPRIM) 300 MG tablet Take 300 mg by mouth daily.        [name] atorvastatin (LIPITOR) 40 MG tablet Take 1 tablet (40 mg total) by mouth daily.  30 tablet  5  . fenofibrate 160 MG tablet Take 160 mg by mouth daily.        . furosemide (LASIX) 40 MG tablet Take 40 mg by mouth daily as needed.        [name] glipiZIDE (GLUCOTROL) 10 MG tablet Take 10 mg by mouth 2 (two) times daily before a meal.       . HYDROcodone-acetaminophen (VICODIN) 5-500 MG per tablet As needed      . insulin detemir (LEVEMIR) 100 UNIT/ML injection Inject 55 Units into the skin 2 (two) times daily.       [name] KLOR-CON M20 20 MEQ tablet take 1 tablet by mouth once daily  30 tablet  5  . metoprolol (TOPROL-XL) 100 MG 24 hr tablet Take 1 tablet (100 mg total) by mouth daily.  90 tablet  6  . olmesartan (BENICAR) 20 MG tablet Take 20 mg by mouth daily.        . Omega-3 Fatty Acids (FISH OIL) 1200 MG CAPS Take 2,000 mg by mouth 2 (two) times daily.       . ranitidine (ZANTAC) 300 MG tablet Take 1 tablet (300 mg total) by mouth daily. As needed  30 tablet  5  . valsartan (DIOVAN) 160 MG tablet Take 1 tablet (160 mg total) by mouth daily.  30 tablet  5  . DISCONTD: furosemide (LASIX) 40 MG tablet Take 1 tablet (40  mg total) by mouth daily.  30 tablet  5    Allergies  Allergen Reactions  . Morphine And Related     Past Medical History  Diagnosis Date  . Hyperlipidemia   . Hypertension   . Coronary heart disease   . Diabetes mellitus     on insulin  . OSA (obstructive sleep apnea)   . Obesity   . CHF (congestive heart failure)   . VT (ventricular tachycardia)   . SVT (supraventricular tachycardia)     Past Surgical History  Procedure Date  . Coronary artery bypass graft 4098,1191  . Tonsillectomy   . Right hand surgery     History  Smoking status  . Former Smoker -- 2.0 packs/day for 30 years  . Types: Cigarettes  . Quit date: 12/13/1995  Smokeless tobacco  . Never Used    History  Alcohol Use No    Family History  Problem Relation Age of Onset  . Heart disease Brother   . Cancer Brother   . Breast cancer Mother     Review of Systems: The  review of systems is positive for chronic dyspnea as noted.  All other systems were reviewed and are negative.  Physical Exam: BP 120/78  Pulse 83  Wt 249 lb (112.946 kg) He is an overweight white male in no acute distress. He is normocephalic, atraumatic. He has a disconjugate gaze. Pupils are equal round and reactive. Oropharynx is clear. Neck is supple no JVD, adenopathy, thyromegaly, or bruits. Lungs are clear. He has no wheezes or rales. Cardiac exam reveals a regular rate and rhythm without gallop or click. He has a grade 2/6 systolic murmur at apex.Abdomen is soft and nontender. He has no ascites. There is no lower extremity edema. Pedal pulses are palpable. Skin is warm and dry. He is alert and oriented x3. Cranial nerves II through XII are intact. LABORATORY DATA: ECG today demonstrates normal sinus rhythm with a first-degree AV block. There is rightward axis. Anterior infarct age undetermined. He has T-wave inversion in leads 3 and aVF consistent with inferior wall ischemia.  Assessment / Plan:

## 2012-01-18 NOTE — Assessment & Plan Note (Signed)
He remains asymptomatic. His last stress test in November of 2011 showed a fixed inferior lateral scar without ischemia. We will continue with his current medical therapy.

## 2012-01-18 NOTE — Assessment & Plan Note (Signed)
No recurrent ventricular tachycardia on his most recent ICD followup in December. He is asymptomatic.

## 2012-01-18 NOTE — Patient Instructions (Signed)
Continue your current therapy  I will see you again in 6 months.   

## 2012-01-18 NOTE — Assessment & Plan Note (Signed)
His most recent ejection fraction was 42% via his nuclear study in November of 2011. He appears to be well compensated on medical therapy. We will continue with his current diuretic dose. I have reinforced the need for sodium restriction.

## 2012-02-24 ENCOUNTER — Other Ambulatory Visit: Payer: Self-pay

## 2012-02-24 MED ORDER — RANITIDINE HCL 300 MG PO TABS: 300.0000 mg | ORAL_TABLET | Freq: Every day | ORAL | Status: DC

## 2012-03-01 ENCOUNTER — Encounter: Payer: Medicare Other | Admitting: *Deleted

## 2012-03-07 ENCOUNTER — Encounter: Payer: Self-pay | Admitting: *Deleted

## 2012-03-16 ENCOUNTER — Telehealth: Payer: Self-pay | Admitting: Internal Medicine

## 2012-03-16 NOTE — Telephone Encounter (Signed)
New Problem:    Patient called in to say that he will send a transmissin in next week because he does not have a land line at the moment.

## 2012-03-16 NOTE — Telephone Encounter (Signed)
Made note in paceart in regards to transmission

## 2012-03-22 ENCOUNTER — Encounter: Payer: Self-pay | Admitting: Internal Medicine

## 2012-03-22 ENCOUNTER — Ambulatory Visit (INDEPENDENT_AMBULATORY_CARE_PROVIDER_SITE_OTHER): Payer: Medicare Other | Admitting: *Deleted

## 2012-03-22 DIAGNOSIS — I472 Ventricular tachycardia: Secondary | ICD-10-CM

## 2012-03-22 DIAGNOSIS — I5042 Chronic combined systolic (congestive) and diastolic (congestive) heart failure: Secondary | ICD-10-CM

## 2012-03-23 LAB — REMOTE ICD DEVICE
HV IMPEDENCE: 63 Ohm
RV LEAD IMPEDENCE ICD: 456 Ohm
RV LEAD THRESHOLD: 1 V
TOT-0002: 0
TOT-0006: 20120827000000
TZAT-0001FASTVT: 1
TZAT-0001SLOWVT: 1
TZAT-0001SLOWVT: 2
TZAT-0002FASTVT: NEGATIVE
TZAT-0005SLOWVT: 84 pct
TZAT-0005SLOWVT: 91 pct
TZAT-0012SLOWVT: 200 ms
TZAT-0013SLOWVT: 2
TZAT-0013SLOWVT: 2
TZAT-0018SLOWVT: NEGATIVE
TZAT-0018SLOWVT: NEGATIVE
TZON-0004SLOWVT: 16
TZON-0005SLOWVT: 12
TZST-0001FASTVT: 3
TZST-0001FASTVT: 4
TZST-0001SLOWVT: 3
TZST-0001SLOWVT: 4
TZST-0001SLOWVT: 6
TZST-0002FASTVT: NEGATIVE
TZST-0002FASTVT: NEGATIVE
TZST-0002FASTVT: NEGATIVE
TZST-0003SLOWVT: 35 J
TZST-0003SLOWVT: 35 J

## 2012-03-29 NOTE — Progress Notes (Signed)
Remote icd check  

## 2012-04-10 ENCOUNTER — Encounter: Payer: Self-pay | Admitting: *Deleted

## 2012-04-24 ENCOUNTER — Other Ambulatory Visit: Payer: Self-pay | Admitting: Cardiology

## 2012-06-21 ENCOUNTER — Other Ambulatory Visit: Payer: Self-pay | Admitting: Cardiology

## 2012-06-21 MED ORDER — ATORVASTATIN CALCIUM 40 MG PO TABS: 40.0000 mg | ORAL_TABLET | Freq: Every day | ORAL | Status: DC

## 2012-06-21 MED ORDER — VALSARTAN 160 MG PO TABS: 160.0000 mg | ORAL_TABLET | Freq: Every day | ORAL | Status: DC

## 2012-06-28 ENCOUNTER — Ambulatory Visit (INDEPENDENT_AMBULATORY_CARE_PROVIDER_SITE_OTHER): Payer: Medicare Other | Admitting: *Deleted

## 2012-06-28 DIAGNOSIS — I472 Ventricular tachycardia, unspecified: Secondary | ICD-10-CM

## 2012-06-28 DIAGNOSIS — I5042 Chronic combined systolic (congestive) and diastolic (congestive) heart failure: Secondary | ICD-10-CM

## 2012-06-28 DIAGNOSIS — I4729 Other ventricular tachycardia: Secondary | ICD-10-CM

## 2012-06-28 DIAGNOSIS — I509 Heart failure, unspecified: Secondary | ICD-10-CM

## 2012-07-02 ENCOUNTER — Encounter: Payer: Self-pay | Admitting: Internal Medicine

## 2012-07-04 LAB — REMOTE ICD DEVICE
CHARGE TIME: 8.528 s
DEV-0020ICD: NEGATIVE
FVT: 0
RV LEAD IMPEDENCE ICD: 494 Ohm
RV LEAD THRESHOLD: 0.875 V
TOT-0001: 1
TOT-0002: 0
TOT-0006: 20120827000000
TZAT-0001FASTVT: 1
TZAT-0004SLOWVT: 8
TZAT-0004SLOWVT: 8
TZAT-0005SLOWVT: 84 pct
TZAT-0005SLOWVT: 91 pct
TZAT-0012SLOWVT: 200 ms
TZAT-0012SLOWVT: 200 ms
TZAT-0013SLOWVT: 2
TZAT-0013SLOWVT: 2
TZAT-0018FASTVT: NEGATIVE
TZAT-0019FASTVT: 8 V
TZON-0003SLOWVT: 360 ms
TZON-0004SLOWVT: 16
TZON-0004VSLOWVT: 20
TZST-0001FASTVT: 2
TZST-0001FASTVT: 6
TZST-0001SLOWVT: 3
TZST-0001SLOWVT: 6
TZST-0002FASTVT: NEGATIVE
TZST-0002FASTVT: NEGATIVE
TZST-0003SLOWVT: 35 J

## 2012-07-17 ENCOUNTER — Encounter: Payer: Self-pay | Admitting: Cardiology

## 2012-07-17 ENCOUNTER — Ambulatory Visit (INDEPENDENT_AMBULATORY_CARE_PROVIDER_SITE_OTHER): Payer: Medicare Other | Admitting: Cardiology

## 2012-07-17 VITALS — BP 124/70 | HR 82 | Ht 72.0 in | Wt 252.0 lb

## 2012-07-17 DIAGNOSIS — R0609 Other forms of dyspnea: Secondary | ICD-10-CM

## 2012-07-17 DIAGNOSIS — I472 Ventricular tachycardia, unspecified: Secondary | ICD-10-CM

## 2012-07-17 DIAGNOSIS — G4733 Obstructive sleep apnea (adult) (pediatric): Secondary | ICD-10-CM

## 2012-07-17 DIAGNOSIS — I10 Essential (primary) hypertension: Secondary | ICD-10-CM

## 2012-07-17 DIAGNOSIS — I2510 Atherosclerotic heart disease of native coronary artery without angina pectoris: Secondary | ICD-10-CM

## 2012-07-17 DIAGNOSIS — R0600 Dyspnea, unspecified: Secondary | ICD-10-CM

## 2012-07-17 NOTE — Patient Instructions (Addendum)
We will schedule you for a nuclear stress test.  You need to lose weight and walk more  Continue your medication.

## 2012-07-17 NOTE — Assessment & Plan Note (Signed)
He has no significant anginal symptoms except his dyspnea may be an anginal equivalent symptom. His last nuclear stress test was in November of 2011 and showed a fixed inferior lateral scar without ischemia. Ejection fraction of 45%. He had normal right heart pressures. He did have attempted angioplasty of the LIMA graft to the LAD at the anastomotic site. This was a very difficult procedure with some improvement in flow. I recommended repeating a LexiScan Myoview study at this time to see if he has any significant ischemia. If not then I think his chronic dyspnea is predominantly related to deconditioning and we'll recommend increasing his walking. I've also recommended weight loss.

## 2012-07-17 NOTE — Assessment & Plan Note (Signed)
His most recent ICD interrogation showed a nonsustained run of ventricular tachycardia but no other significant arrhythmias or therapies.

## 2012-07-17 NOTE — Assessment & Plan Note (Signed)
Blood pressure is under good control today 

## 2012-07-17 NOTE — Progress Notes (Signed)
 Walter French Date of Birth: 05/04/1946   History of Present Illness: Walter French is seen today for followup. He complains that he has been more short of breath over the past couple of months. He gets out of breath if he does any strenuous activity. He thinks this is worse than in the past. He has been using his CPAP for at least 6 hours a night. He denies any significant chest pain or pressure. He has had no increase in edema. He states he was given an inhaler in the past but this seemed to make his breathing worse.  Current Outpatient Prescriptions on File Prior to Visit  Medication Sig Dispense Refill  . allopurinol (ZYLOPRIM) 300 MG tablet Take 300 mg by mouth daily.        . aspirin 325 MG tablet Take 325 mg by mouth daily.      . atorvastatin (LIPITOR) 40 MG tablet Take 1 tablet (40 mg total) by mouth daily.  30 tablet  5  . fenofibrate 160 MG tablet Take 160 mg by mouth daily.        . furosemide (LASIX) 40 MG tablet Take 40 mg by mouth daily as needed.        . HYDROcodone-acetaminophen (VICODIN) 5-500 MG per tablet As needed      . insulin detemir (LEVEMIR) 100 UNIT/ML injection Inject 55 Units into the skin 2 (two) times daily.       . KLOR-CON M20 20 MEQ tablet take 1 tablet by mouth once daily  30 tablet  5  . Liraglutide (VICTOZA [place]) Inject into the skin.      . ranitidine (ZANTAC) 300 MG tablet Take 1 tablet (300 mg total) by mouth daily. As needed  30 tablet  5  . colchicine 0.6 MG tablet Take 0.6 mg by mouth 4 (four) times daily as needed.      . glipiZIDE (GLUCOTROL) 10 MG tablet Take 10 mg by mouth 2 (two) times daily before a meal.       . metoprolol (TOPROL-XL) 100 MG 24 hr tablet Take 1 tablet (100 mg total) by mouth daily.  90 tablet  6  . olmesartan (BENICAR) 20 MG tablet Take 20 mg by mouth daily.        . valsartan (DIOVAN) 160 MG tablet Take 1 tablet (160 mg total) by mouth daily.  30 tablet  5    Allergies  Allergen Reactions  . Morphine And Related      Past Medical History  Diagnosis Date  . Hyperlipidemia   . Hypertension   . Coronary heart disease   . Diabetes mellitus     on insulin  . OSA (obstructive sleep apnea)   . Obesity   . CHF (congestive heart failure)   . VT (ventricular tachycardia)   . SVT (supraventricular tachycardia)     Past Surgical History  Procedure Date  . Coronary artery bypass graft 1997,2007  . Tonsillectomy   . Right hand surgery     History  Smoking status  . Former Smoker -- 2.0 packs/day for 30 years  . Types: Cigarettes  . Quit date: 12/13/1995  Smokeless tobacco  . Never Used    History  Alcohol Use No    Family History  Problem Relation Age of Onset  . Heart disease Brother   . Cancer Brother   . Breast cancer Mother     Review of Systems: The review of systems is positive for chronic dyspnea.    All other systems were reviewed and are negative.  Physical Exam: BP 124/70  Pulse 82  Ht 6' (1.829 m)  Wt 252 lb (114.306 kg)  BMI 34.18 kg/m2  SpO2 99% He is an overweight white male in no acute distress. He is normocephalic, atraumatic. He has a disconjugate gaze. Pupils are equal round and reactive. Oropharynx is clear. Neck is supple no JVD, adenopathy, thyromegaly, or bruits. Lungs are clear. He has no wheezes or rales. Cardiac exam reveals a regular rate and rhythm without gallop or click. He has a grade 1-2/6 systolic murmur at apex. Abdomen is soft and nontender. He has no ascites. There is no lower extremity edema. Pedal pulses are palpable. Skin is warm and dry. He is alert and oriented x3. Cranial nerves II through XII are intact. LABORATORY DATA:   Assessment / Plan:  

## 2012-07-24 ENCOUNTER — Ambulatory Visit (HOSPITAL_COMMUNITY): Payer: Medicare Other | Attending: Cardiology | Admitting: Radiology

## 2012-07-24 VITALS — BP 129/82 | HR 75 | Ht 72.0 in | Wt 252.0 lb

## 2012-07-24 DIAGNOSIS — I10 Essential (primary) hypertension: Secondary | ICD-10-CM | POA: Insufficient documentation

## 2012-07-24 DIAGNOSIS — E785 Hyperlipidemia, unspecified: Secondary | ICD-10-CM | POA: Insufficient documentation

## 2012-07-24 DIAGNOSIS — Z8249 Family history of ischemic heart disease and other diseases of the circulatory system: Secondary | ICD-10-CM | POA: Insufficient documentation

## 2012-07-24 DIAGNOSIS — Z794 Long term (current) use of insulin: Secondary | ICD-10-CM | POA: Insufficient documentation

## 2012-07-24 DIAGNOSIS — R0600 Dyspnea, unspecified: Secondary | ICD-10-CM

## 2012-07-24 DIAGNOSIS — I472 Ventricular tachycardia: Secondary | ICD-10-CM

## 2012-07-24 DIAGNOSIS — R0609 Other forms of dyspnea: Secondary | ICD-10-CM | POA: Insufficient documentation

## 2012-07-24 DIAGNOSIS — R61 Generalized hyperhidrosis: Secondary | ICD-10-CM | POA: Insufficient documentation

## 2012-07-24 DIAGNOSIS — G4733 Obstructive sleep apnea (adult) (pediatric): Secondary | ICD-10-CM

## 2012-07-24 DIAGNOSIS — R0989 Other specified symptoms and signs involving the circulatory and respiratory systems: Secondary | ICD-10-CM | POA: Insufficient documentation

## 2012-07-24 DIAGNOSIS — I2510 Atherosclerotic heart disease of native coronary artery without angina pectoris: Secondary | ICD-10-CM

## 2012-07-24 DIAGNOSIS — R079 Chest pain, unspecified: Secondary | ICD-10-CM | POA: Insufficient documentation

## 2012-07-24 DIAGNOSIS — E119 Type 2 diabetes mellitus without complications: Secondary | ICD-10-CM | POA: Insufficient documentation

## 2012-07-24 DIAGNOSIS — Z87891 Personal history of nicotine dependence: Secondary | ICD-10-CM | POA: Insufficient documentation

## 2012-07-24 MED ORDER — TECHNETIUM TC 99M TETROFOSMIN IV KIT
10.0000 | PACK | Freq: Once | INTRAVENOUS | Status: AC | PRN
  Administered 2012-07-24: 10 via INTRAVENOUS

## 2012-07-25 ENCOUNTER — Ambulatory Visit (HOSPITAL_COMMUNITY): Payer: Medicare Other | Attending: Cardiovascular Disease | Admitting: Radiology

## 2012-07-25 DIAGNOSIS — Z87891 Personal history of nicotine dependence: Secondary | ICD-10-CM | POA: Insufficient documentation

## 2012-07-25 DIAGNOSIS — R079 Chest pain, unspecified: Secondary | ICD-10-CM

## 2012-07-25 DIAGNOSIS — I2510 Atherosclerotic heart disease of native coronary artery without angina pectoris: Secondary | ICD-10-CM

## 2012-07-25 DIAGNOSIS — I10 Essential (primary) hypertension: Secondary | ICD-10-CM | POA: Insufficient documentation

## 2012-07-25 DIAGNOSIS — I999 Unspecified disorder of circulatory system: Secondary | ICD-10-CM | POA: Insufficient documentation

## 2012-07-25 DIAGNOSIS — E119 Type 2 diabetes mellitus without complications: Secondary | ICD-10-CM | POA: Insufficient documentation

## 2012-07-25 DIAGNOSIS — R0602 Shortness of breath: Secondary | ICD-10-CM

## 2012-07-25 MED ORDER — REGADENOSON 0.4 MG/5ML IV SOLN
0.4000 mg | Freq: Once | INTRAVENOUS | Status: AC
  Administered 2012-07-25: 0.4 mg via INTRAVENOUS

## 2012-07-25 MED ORDER — TECHNETIUM TC 99M TETROFOSMIN IV KIT
33.0000 | PACK | Freq: Once | INTRAVENOUS | Status: AC | PRN
  Administered 2012-07-25: 33 via INTRAVENOUS

## 2012-07-25 NOTE — Progress Notes (Signed)
[facility] SITE 3 NUCLEAR MED [address] [place] [address] [phone]  Cardiology Nuclear Med Study  Walter French is a 66 y.o. male     MRN : [id number]     DOB: 03-18-1946  Procedure Date: 07/25/2012  Nuclear Med Background Indication for Stress Test:  Evaluation for Ischemia and PTCA/Graft Patency History:  '07 Re-do CABG; '08 PTCA-LIMA>LAD; '11 MPS:No ischemia, EF=45%; '11 Echo:EF=50%; '12 AICD Cardiac Risk Factors: Family History - CAD, History of Smoking, Hypertension, IDDM Type 2 and Lipids.  Symptoms:  Chest Pain with Stress (last episode of chest discomfort was last week), Diaphoresis and DOE.   Nuclear Pre-Procedure Caffeine/Decaff Intake:  None NPO After: 7:00pm   Lungs:  Clear. O2 Sat: 98% on room air. IV 0.9% NS with Angio Cath:  20g  IV Site: R Antecubital  IV Started by:  Bonnita Levan, RN  Chest Size (in):  50 Cup Size: n/a  Height: 6' (1.829 m)  Weight:  252 lb (114.306 kg)  BMI:  Body mass index is 34.18 kg/(m^2). Tech Comments:  Patient held all meds today    Nuclear Med Study 1 or 2 day study: 2 day  Stress Test Type:  Eugenie Birks  Reading MD: Kristeen Miss, MD  Order Authorizing Provider:  Peter Swaziland, MD  Resting Radionuclide: Technetium 83m Tetrofosmin  Resting Radionuclide Dose: 10.6 mCi   Stress Radionuclide:  Technetium 39m Tetrofosmin  Stress Radionuclide Dose: 33.0 mCi           Stress Protocol Rest HR: 75 Stress HR: 103  Rest BP: 129/82 Stress BP: 146/94  Exercise Time (min): n/a METS: n/a   Predicted Max HR: 157 bpm % Max HR: 65.61 bpm Rate Pressure Product: 46962   Dose of Adenosine (mg):  n/a Dose of Lexiscan: 0.4 mg  Dose of Atropine (mg): n/a Dose of Dobutamine: n/a mcg/kg/min (at max HR)  Stress Test Technologist: Smiley Houseman, CMA-N  Nuclear Technologist:  Domenic Polite, CNMT     Rest Procedure:  Myocardial perfusion imaging was performed at rest 45 minutes following the intravenous administration  of Technetium 17m Tetrofosmin.  Rest ECG: Nonspecific T-wave changes with occasional PVC's.  Stress Procedure:  The patient received IV Lexiscan 0.4 mg over 15-seconds.  Technetium 51m Tetrofosmin injected at 30-seconds.  There were no significant changes with Lexiscan, occasional PVC's were noted.  Quantitative spect images were obtained after a 45 minute delay.  Stress ECG: No significant change from baseline ECG  QPS Raw Data Images:  Normal; no motion artifact; normal heart/lung ratio. Stress Images:  There is marked cardiomegaly.  There is a medium sized severe defect in the entire apex and a large moderately severe defect of the entire inferiolateral wall. Rest Images:  There is marked cardiomegaly.  There is a medium sized severe defect in the entire apex and a large mild defect of the entire inferiolateral wall. Subtraction (SDS):  This is consistent with a previous apical MI with a large area of ischemia in the entire lateral wall.  Transient Ischemic Dilatation (Normal <1.22):  1.06 Lung/Heart Ratio (Normal <0.45):  0.40  Quantitative Gated Spect Images QGS EDV:  196 ml QGS ESV:  134 ml  Impression Exercise Capacity:  Lexiscan with no exercise. BP Response:  Normal blood pressure response. Clinical Symptoms:  No significant symptoms noted. ECG Impression:  No significant ST segment change suggestive of ischemia. Comparison with Prior Nuclear Study: No images to compare  Overall Impression:  High risk stress nuclear  study.  There is evidence of a previous apical MI and evidence of lateral ischemia.   LV Ejection Fraction: 32%.  LV Wall Motion:  There is akinesis of the apex and severe hypokineisis of the inferior and lateral walls.     Vesta Mixer, Montez Hageman., MD, [facility] 07/25/2012, 5:35 PM Office - [phone] Pager [phone]

## 2012-07-26 ENCOUNTER — Encounter: Payer: Self-pay | Admitting: *Deleted

## 2012-07-31 ENCOUNTER — Other Ambulatory Visit: Payer: Self-pay

## 2012-07-31 ENCOUNTER — Telehealth: Payer: Self-pay | Admitting: Cardiology

## 2012-07-31 ENCOUNTER — Encounter (HOSPITAL_COMMUNITY): Payer: Self-pay | Admitting: Pharmacy Technician

## 2012-07-31 DIAGNOSIS — I2510 Atherosclerotic heart disease of native coronary artery without angina pectoris: Secondary | ICD-10-CM

## 2012-07-31 NOTE — Telephone Encounter (Signed)
Please return call to patient [phone] regarding test results

## 2012-07-31 NOTE — Telephone Encounter (Signed)
Patient called no answer.LMTC. 

## 2012-08-02 ENCOUNTER — Ambulatory Visit
Admission: RE | Admit: 2012-08-02 | Discharge: 2012-08-02 | Disposition: A | Discharge status: Home / Self Care | Payer: Medicare Other | Source: Ambulatory Visit | Attending: Cardiology | Admitting: Cardiology

## 2012-08-02 ENCOUNTER — Other Ambulatory Visit (INDEPENDENT_AMBULATORY_CARE_PROVIDER_SITE_OTHER): Payer: Medicare Other

## 2012-08-02 DIAGNOSIS — I2510 Atherosclerotic heart disease of native coronary artery without angina pectoris: Secondary | ICD-10-CM

## 2012-08-02 LAB — CBC WITH DIFFERENTIAL/PLATELET
Basophils Absolute: 0 10*3/uL (ref 0.0–0.1)
Basophils Relative: 0.4 % (ref 0.0–3.0)
Eosinophils Absolute: 0.3 10*3/uL (ref 0.0–0.7)
Lymphocytes Relative: 28.7 % (ref 12.0–46.0)
MCHC: 32.8 g/dL (ref 30.0–36.0)
MCV: 90.3 fl (ref 78.0–100.0)
Monocytes Absolute: 0.8 10*3/uL (ref 0.1–1.0)
Neutrophils Relative %: 56.7 % (ref 43.0–77.0)
Platelets: 145 10*3/uL — ABNORMAL LOW (ref 150.0–400.0)
RBC: 4.86 Mil/uL (ref 4.22–5.81)
RDW: 14.3 % (ref 11.5–14.6)

## 2012-08-02 LAB — PROTIME-INR: INR: 0.9 ratio (ref 0.8–1.0)

## 2012-08-02 LAB — BASIC METABOLIC PANEL
BUN: 18 mg/dL (ref 6–23)
CO2: 27 mEq/L (ref 19–32)
Calcium: 9.3 mg/dL (ref 8.4–10.5)
Chloride: 103 mEq/L (ref 96–112)
Creatinine, Ser: 1.3 mg/dL (ref 0.4–1.5)
Glucose, Bld: 127 mg/dL — ABNORMAL HIGH (ref 70–99)

## 2012-08-02 NOTE — Telephone Encounter (Signed)
Patient called was given results of lab work and cxr.Patient was told cath instructions and instructions mailed to patient.

## 2012-08-02 NOTE — Telephone Encounter (Signed)
F/u   Pt return nurse call he can be reached at hm#

## 2012-08-03 ENCOUNTER — Telehealth: Payer: Self-pay

## 2012-08-03 ENCOUNTER — Other Ambulatory Visit: Payer: Self-pay | Admitting: Cardiology

## 2012-08-03 DIAGNOSIS — R9439 Abnormal result of other cardiovascular function study: Secondary | ICD-10-CM

## 2012-08-03 NOTE — Telephone Encounter (Signed)
Patient called was told cath has been changed to JV Cath lab Tuesday 08/07/12 at 11:00 am arrive at 10:00 am.Patient was given instructions and will pick up instructions today Friday 08/03/12 before 5:00 pm.

## 2012-08-07 ENCOUNTER — Inpatient Hospital Stay (HOSPITAL_COMMUNITY)
Admission: RE | Admit: 2012-08-07 | Discharge: 2012-08-07 | Disposition: A | Discharge status: Home / Self Care | Payer: Medicare Other | Source: Ambulatory Visit | Attending: Cardiology | Admitting: Cardiology

## 2012-08-07 ENCOUNTER — Encounter (HOSPITAL_BASED_OUTPATIENT_CLINIC_OR_DEPARTMENT_OTHER)
Admission: RE | Disposition: A | Discharge status: Home / Self Care | Payer: Self-pay | Source: Ambulatory Visit | Attending: Cardiology

## 2012-08-07 DIAGNOSIS — I498 Other specified cardiac arrhythmias: Secondary | ICD-10-CM | POA: Insufficient documentation

## 2012-08-07 DIAGNOSIS — I2510 Atherosclerotic heart disease of native coronary artery without angina pectoris: Secondary | ICD-10-CM | POA: Insufficient documentation

## 2012-08-07 DIAGNOSIS — G4733 Obstructive sleep apnea (adult) (pediatric): Secondary | ICD-10-CM | POA: Insufficient documentation

## 2012-08-07 DIAGNOSIS — I509 Heart failure, unspecified: Secondary | ICD-10-CM | POA: Insufficient documentation

## 2012-08-07 DIAGNOSIS — E119 Type 2 diabetes mellitus without complications: Secondary | ICD-10-CM | POA: Insufficient documentation

## 2012-08-07 DIAGNOSIS — Z794 Long term (current) use of insulin: Secondary | ICD-10-CM | POA: Insufficient documentation

## 2012-08-07 DIAGNOSIS — Z951 Presence of aortocoronary bypass graft: Secondary | ICD-10-CM | POA: Insufficient documentation

## 2012-08-07 DIAGNOSIS — R9439 Abnormal result of other cardiovascular function study: Secondary | ICD-10-CM

## 2012-08-07 DIAGNOSIS — E785 Hyperlipidemia, unspecified: Secondary | ICD-10-CM | POA: Insufficient documentation

## 2012-08-07 DIAGNOSIS — I10 Essential (primary) hypertension: Secondary | ICD-10-CM | POA: Insufficient documentation

## 2012-08-07 DIAGNOSIS — E669 Obesity, unspecified: Secondary | ICD-10-CM | POA: Insufficient documentation

## 2012-08-07 LAB — POCT I-STAT 3, VENOUS BLOOD GAS (G3P V)
O2 Saturation: 62 %
pCO2, Ven: 44.5 mmHg — ABNORMAL LOW (ref 45.0–50.0)

## 2012-08-07 LAB — POCT I-STAT 3, ART BLOOD GAS (G3+)
Acid-base deficit: 1 mmol/L (ref 0.0–2.0)
O2 Saturation: 93 %
TCO2: 25 mmol/L (ref 0–100)
pO2, Arterial: 65 mmHg — ABNORMAL LOW (ref 80.0–100.0)

## 2012-08-07 LAB — POCT I-STAT GLUCOSE: Glucose, Bld: 111 mg/dL — ABNORMAL HIGH (ref 70–99)

## 2012-08-07 SURGERY — LEFT AND RIGHT HEART CATHETERIZATION WITH CORONARY/GRAFT ANGIOGRAM
Anesthesia: LOCAL

## 2012-08-07 SURGERY — JV LEFT AND RIGHT HEART CATHETERIZATION WITH CORONARY/GRAFT ANGIOGRAM
Anesthesia: Moderate Sedation

## 2012-08-07 MED ORDER — ACETAMINOPHEN 325 MG PO TABS: 650.0000 mg | ORAL_TABLET | ORAL | Status: DC | PRN

## 2012-08-07 MED ORDER — SODIUM CHLORIDE 0.9 % IV SOLN: 1.0000 mL/kg/h | INTRAVENOUS | Status: DC

## 2012-08-07 MED ORDER — SODIUM CHLORIDE 0.9 % IV SOLN: INTRAVENOUS | Status: DC

## 2012-08-07 MED ORDER — DIAZEPAM 5 MG PO TABS
5.0000 mg | ORAL_TABLET | ORAL | Status: AC
  Administered 2012-08-07: 5 mg via ORAL

## 2012-08-07 MED ORDER — ONDANSETRON HCL 4 MG/2ML IJ SOLN
4.0000 mg | Freq: Four times a day (QID) | INTRAMUSCULAR | Status: DC | PRN
Start: 2012-08-07 — End: 2012-08-07

## 2012-08-07 MED ORDER — SODIUM CHLORIDE 0.9 % IJ SOLN: 3.0000 mL | INTRAMUSCULAR | Status: DC | PRN

## 2012-08-07 MED ORDER — SODIUM CHLORIDE 0.9 % IJ SOLN: 3.0000 mL | Freq: Two times a day (BID) | INTRAMUSCULAR | Status: DC

## 2012-08-07 MED ORDER — ASPIRIN 81 MG PO CHEW
324.0000 mg | CHEWABLE_TABLET | ORAL | Status: AC
  Administered 2012-08-07: 324 mg via ORAL

## 2012-08-07 MED ORDER — SODIUM CHLORIDE 0.9 % IV SOLN: 250.0000 mL | INTRAVENOUS | Status: DC | PRN

## 2012-08-07 NOTE — Interval H&P Note (Signed)
History and Physical Interval Note:  08/07/2012 2:17 PM  Walter French  has presented today for surgery, with the diagnosis of dyspnea  The various methods of treatment have been discussed with the patient and family. After consideration of risks, benefits and other options for treatment, the patient has consented to  Procedure(s) (LRB): JV LEFT AND RIGHT HEART CATHETERIZATION WITH CORONARY/GRAFT ANGIOGRAM (N/A) as a surgical intervention .  [name] history has been reviewed, patient examined, no change in status, stable for surgery.  I have reviewed the patient's chart and labs.  Questions were answered to the patient's satisfaction.     [name] [facility] 08/07/2012 2:17 PM

## 2012-08-07 NOTE — OR Nursing (Signed)
Meal served 

## 2012-08-07 NOTE — CV Procedure (Signed)
   Cardiac Catheterization Procedure Note  Name: MACIAH SCHWEIGERT MRN: [id number] DOB: 1946-05-19  Procedure: Right Heart Cath, Left Heart Cath, Selective Coronary Angiography, LV angiography  Indication: 66 year old white male status post redo coronary bypass surgery. He is status post angioplasty of the IMA to LAD anastomosis in 2008. This is a complicated procedure with dissection of the distal LAD. He presents now with increasing symptoms of dyspnea. LexiScan Myoview study demonstrates evidence of apical infarct. There is inferior lateral ischemia. Ejection fraction was diminished at 32%.   Procedural Details: The right groin was prepped, draped, and anesthetized with 1% lidocaine. Using the modified Seldinger technique a 4 French sheath was placed in the right femoral artery and a 7 French sheath was placed in the right femoral vein. A Swan-Ganz catheter was used for the right heart catheterization. Standard protocol was followed for recording of right heart pressures and sampling of oxygen saturations. Fick cardiac output was calculated. Standard Judkins catheters were used for selective coronary angiography and left ventriculography. There were no immediate procedural complications. The patient was transferred to the post catheterization recovery area for further monitoring.  Procedural Findings: Hemodynamics RA 10/10 with a mean of 8 mmHg RV 37/10 mmHg PA 36/15 with a mean of 25 mmHg PCWP 17/13 with a mean of 11 mmHg LV 131/13 mmHg AO 135/71 with a mean of 96 mmHg  There is no significant aortic or mitral valve gradient.  Oxygen saturations: PA 62% AO 93%  Cardiac Output (Fick) 4.8 L per minute  Cardiac Index (Fick) 2.1 L per minute per meter square   Coronary angiography: Coronary dominance: right  Left mainstem: The left main is very short with essentially a shared ostium for the LAD and left circumflex.  Left anterior descending (LAD): The left anterior descending artery  is occluded after the second diagonal branch.  Left circumflex (LCx): The left circumflex coronary has a long 90% stenosis in the proximal vessel and is then occluded in the mid vessel.  Right coronary artery (RCA): The right coronary is occluded proximally.  The saphenous vein graft to the PDA is widely patent without significant stenosis.  The saphenous vein graft to the posterior lateral branch is also widely patent. There is a prominent bowel in the mid graft. It does have diffuse 20% disease.  The LIMA graft to the LAD is widely patent. At the anastomotic site there is an asymmetric 70-80% stenosis.  Left ventriculography: Left ventricular systolic function is abnormal. Ejection fraction is estimated at 40-45%. There is apical akinesis. There is no significant mitral insufficiency.  Final Conclusions:   1. Severe three-vessel obstructive coronary disease. 2. Patent saphenous vein graft to the PDA. 3. Patent saphenous vein graft to the posterior lateral branch. 4. Patent LIMA graft to the LAD. There is a moderately severe stenosis at the anastomotic site. 5. Moderate left ventricular dysfunction. 6. Normal right heart pressures.  Recommendations: Recommend continued medical therapy. The area of ischemia noted on his nuclear stress testing correlates with the left circumflex occlusion. This is not amenable to revascularization. While there is a moderately severe stenosis at the anastomotic site of the IMA graft to the LAD there was no ischemia noted in this territory. The apex was scar. We had previously attempted angioplasty of this lesion with moderate success but it was an extremely difficult procedure. Based on the results of today's study I feel we should continue with medical management.   [name] [facility] 08/07/2012, 2:53 PM

## 2012-08-07 NOTE — H&P (View-Only) (Signed)
[name] Date of Birth: [birth year]   History of Present Illness: Mr. Ravert is seen today for followup. He complains that he has been more short of breath over the past couple of months. He gets out of breath if he does any strenuous activity. He thinks this is worse than in the past. He has been using his CPAP for at least 6 hours a night. He denies any significant chest pain or pressure. He has had no increase in edema. He states he was given an inhaler in the past but this seemed to make his breathing worse.  Current Outpatient Prescriptions on File Prior to Visit  Medication Sig Dispense Refill  . allopurinol (ZYLOPRIM) 300 MG tablet Take 300 mg by mouth daily.        [name] aspirin 325 MG tablet Take 325 mg by mouth daily.      [name] atorvastatin (LIPITOR) 40 MG tablet Take 1 tablet (40 mg total) by mouth daily.  30 tablet  5  . fenofibrate 160 MG tablet Take 160 mg by mouth daily.        . furosemide (LASIX) 40 MG tablet Take 40 mg by mouth daily as needed.        [name] HYDROcodone-acetaminophen (VICODIN) 5-500 MG per tablet As needed      . insulin detemir (LEVEMIR) 100 UNIT/ML injection Inject 55 Units into the skin 2 (two) times daily.       [name] KLOR-CON M20 20 MEQ tablet take 1 tablet by mouth once daily  30 tablet  5  . Liraglutide (VICTOZA [place]) Inject into the skin.      . ranitidine (ZANTAC) 300 MG tablet Take 1 tablet (300 mg total) by mouth daily. As needed  30 tablet  5  . colchicine 0.6 MG tablet Take 0.6 mg by mouth 4 (four) times daily as needed.      [name] glipiZIDE (GLUCOTROL) 10 MG tablet Take 10 mg by mouth 2 (two) times daily before a meal.       . metoprolol (TOPROL-XL) 100 MG 24 hr tablet Take 1 tablet (100 mg total) by mouth daily.  90 tablet  6  . olmesartan (BENICAR) 20 MG tablet Take 20 mg by mouth daily.        . valsartan (DIOVAN) 160 MG tablet Take 1 tablet (160 mg total) by mouth daily.  30 tablet  5    Allergies  Allergen Reactions  . Morphine And Related      Past Medical History  Diagnosis Date  . Hyperlipidemia   . Hypertension   . Coronary heart disease   . Diabetes mellitus     on insulin  . OSA (obstructive sleep apnea)   . Obesity   . CHF (congestive heart failure)   . VT (ventricular tachycardia)   . SVT (supraventricular tachycardia)     Past Surgical History  Procedure Date  . Coronary artery bypass graft 9604,5409  . Tonsillectomy   . Right hand surgery     History  Smoking status  . Former Smoker -- 2.0 packs/day for 30 years  . Types: Cigarettes  . Quit date: 12/13/1995  Smokeless tobacco  . Never Used    History  Alcohol Use No    Family History  Problem Relation Age of Onset  . Heart disease Brother   . Cancer Brother   . Breast cancer Mother     Review of Systems: The review of systems is positive for chronic dyspnea.  All other systems were reviewed and are negative.  Physical Exam: BP 124/70  Pulse 82  Ht 6' (1.829 m)  Wt 252 lb (114.306 kg)  BMI 34.18 kg/m2  SpO2 99% He is an overweight white male in no acute distress. He is normocephalic, atraumatic. He has a disconjugate gaze. Pupils are equal round and reactive. Oropharynx is clear. Neck is supple no JVD, adenopathy, thyromegaly, or bruits. Lungs are clear. He has no wheezes or rales. Cardiac exam reveals a regular rate and rhythm without gallop or click. He has a grade 1-2/6 systolic murmur at apex. Abdomen is soft and nontender. He has no ascites. There is no lower extremity edema. Pedal pulses are palpable. Skin is warm and dry. He is alert and oriented x3. Cranial nerves II through XII are intact. LABORATORY DATA:   Assessment / Plan:

## 2012-08-07 NOTE — OR Nursing (Signed)
Dr Swaziland at bedside to discuss results and treatment plan with pt and friends

## 2012-08-07 NOTE — OR Nursing (Signed)
Tegaderm dressing applied, site level 0, bedrest begins at 1510

## 2012-08-07 NOTE — OR Nursing (Signed)
Discharge instructions reviewed and signed, pt stated understanding, ambulated in hall without difficulty, site level 0, transported to friend's car via wheelchair 

## 2012-08-22 ENCOUNTER — Ambulatory Visit (INDEPENDENT_AMBULATORY_CARE_PROVIDER_SITE_OTHER): Payer: Medicare Other | Admitting: Nurse Practitioner

## 2012-08-22 ENCOUNTER — Encounter: Payer: Self-pay | Admitting: Nurse Practitioner

## 2012-08-22 VITALS — BP 136/84 | HR 81 | Ht 72.0 in | Wt 251.0 lb

## 2012-08-22 DIAGNOSIS — Z9889 Other specified postprocedural states: Secondary | ICD-10-CM

## 2012-08-22 NOTE — Progress Notes (Signed)
Walter French Date of Birth: 11-30-1946 Medical Record #[id number]  History of Present Illness: Walter French is seen today for a post hospital visit. He is seen for Dr. Swaziland. He has had recent right and left heart cath due to increasing dyspnea and an abnormal Lexiscan. His cath showed severe 3VD, patent SVG to PD, patent SVG to PL, patent LIMA to LAD with a moderate severe stenosis at the anastomosis site and moderate LV dysfunction. He had normal right heart pressures. EF is 40 to 45%. Medical therapy was recommended to be continued. The area of ischemia noted on the stress test correlates with the LCX occlusion. This is not amenable to revascularization. While there is a moderately severe stenosis at the anastomotic site of the IMA graft to the LAD, there was no ischemia noted in this terrority on the Myoview. The apex was scar. He has had prior attempted angioplasty of this lesion with moderate success but it was an extremely difficult procedure. It was Dr. Elvis Coil opinion to continue medical therapy.   His other issues include HLD, gout, HTN, VT with ICD in place, OSA, DM and obesity.   He comes in today. He is here alone. Says he is doing well and feels good. Says his breathing is ok today. Has had some issues with dyspnea in the past that an inhaler made worse. He feels like his most recent issues were heat related. He is not having chest pain. No shocks from his ICD. He is tolerating his medicines. No problem with his groin. Not dizzy. No swelling. Tries to watch his salt but does like to eat out occasionally. Does not use scales at home.   Current Outpatient Prescriptions on File Prior to Visit  Medication Sig Dispense Refill  . allopurinol (ZYLOPRIM) 300 MG tablet Take 300 mg by mouth daily.        [name] aspirin 325 MG tablet Take 325 mg by mouth daily.      [name] atorvastatin (LIPITOR) 40 MG tablet Take 1 tablet (40 mg total) by mouth daily.  30 tablet  5  . colchicine 0.6 MG tablet Take 0.6  mg by mouth 4 (four) times daily as needed. For gout flares      . fenofibrate 160 MG tablet Take 160 mg by mouth daily.        [name] HYDROcodone-acetaminophen (VICODIN) 5-500 MG per tablet Take 1 tablet by mouth every 6 (six) hours as needed. For pain      . insulin detemir (LEVEMIR) 100 UNIT/ML injection Inject 75 Units into the skin 2 (two) times daily.       [name] KLOR-CON M20 20 MEQ tablet take 1 tablet by mouth once daily  30 tablet  5  . Liraglutide (VICTOZA [place]) Inject 18 Units into the skin daily.       . metoprolol (TOPROL-XL) 100 MG 24 hr tablet Take 1 tablet (100 mg total) by mouth daily.  90 tablet  6  . ranitidine (ZANTAC) 300 MG tablet Take 1 tablet (300 mg total) by mouth daily. As needed  30 tablet  5  . valsartan (DIOVAN) 160 MG tablet Take 1 tablet (160 mg total) by mouth daily.  30 tablet  5    Allergies  Allergen Reactions  . Morphine And Related Nausea And Vomiting    Past Medical History  Diagnosis Date  . Hyperlipidemia   . Hypertension   . Coronary heart disease   . Diabetes mellitus     on insulin  .  OSA (obstructive sleep apnea)   . Obesity   . CHF (congestive heart failure)   . VT (ventricular tachycardia)   . SVT (supraventricular tachycardia)     Past Surgical History  Procedure Date  . Coronary artery bypass graft 1610,9604  . Tonsillectomy   . Right hand surgery     History  Smoking status  . Former Smoker -- 2.0 packs/day for 30 years  . Types: Cigarettes  . Quit date: 12/13/1995  Smokeless tobacco  . Never Used    History  Alcohol Use No    Family History  Problem Relation Age of Onset  . Heart disease Brother   . Cancer Brother   . Breast cancer Mother     Review of Systems: The review of systems is per the HPI.  All other systems were reviewed and are negative.  Physical Exam: BP 136/84  Pulse 81  Ht 6' (1.829 m)  Wt 251 lb (113.853 kg)  BMI 34.04 kg/m2  SpO2 96% Patient is very pleasant and in no acute distress. He is  obese. Skin is warm and dry. Color is normal.  HEENT is unremarkable. Normocephalic/atraumatic. PERRL. Sclera are nonicteric. Neck is supple. No masses. No JVD. Lungs are clear. Cardiac exam shows a regular rate and rhythm. Abdomen is obese but soft. Extremities are without edema. Gait and ROM are intact. No gross neurologic deficits noted.   LABORATORY DATA:  Lab Results  Component Value Date   WBC 8.2 08/02/2012   HGB 14.4 08/02/2012   HCT 43.9 08/02/2012   PLT 145.0* 08/02/2012   GLUCOSE 111* 08/07/2012   NA 136 08/02/2012   K 4.2 08/02/2012   CL 103 08/02/2012   CREATININE 1.3 08/02/2012   BUN 18 08/02/2012   CO2 27 08/02/2012   INR 0.9 08/02/2012     Assessment / Plan:  1. S/P cardiac cath - with findings as noted above. He is to continue with his medical regimen. He is doing well. Tolerating his medicines. Will see him back in February as planned.   2. HTN - BP is ok. No change in his current regimen.  Overall, he is doing well post cath. Will see him back in February. No change in his current regimen. Encouraged salt restriction and to stay active. Patient is agreeable to this plan and will call if any problems develop in the interim.

## 2012-08-22 NOTE — Patient Instructions (Addendum)
Stay on your current medicines  Stay active  Watch your salt  We will see you back in February as planned  [facility] to get a flu shot next month  Call the [facility] office at [phone] if you have any questions, problems or concerns.

## 2012-09-05 ENCOUNTER — Other Ambulatory Visit: Payer: Self-pay | Admitting: Nurse Practitioner

## 2012-09-05 MED ORDER — RANITIDINE HCL 300 MG PO TABS: 300.0000 mg | ORAL_TABLET | Freq: Every day | ORAL | Status: DC

## 2012-09-19 ENCOUNTER — Other Ambulatory Visit: Payer: Self-pay

## 2012-09-19 MED ORDER — METOPROLOL SUCCINATE ER 100 MG PO TB24: 100.0000 mg | ORAL_TABLET | Freq: Every day | ORAL | Status: DC

## 2012-10-04 ENCOUNTER — Other Ambulatory Visit: Payer: Self-pay | Admitting: Cardiology

## 2012-10-07 ENCOUNTER — Encounter: Payer: Self-pay | Admitting: Internal Medicine

## 2012-10-08 ENCOUNTER — Ambulatory Visit (INDEPENDENT_AMBULATORY_CARE_PROVIDER_SITE_OTHER): Payer: Medicare Other | Admitting: *Deleted

## 2012-10-08 DIAGNOSIS — I4729 Other ventricular tachycardia: Secondary | ICD-10-CM

## 2012-10-08 DIAGNOSIS — I509 Heart failure, unspecified: Secondary | ICD-10-CM

## 2012-10-08 DIAGNOSIS — Z95810 Presence of automatic (implantable) cardiac defibrillator: Secondary | ICD-10-CM

## 2012-10-08 DIAGNOSIS — I5042 Chronic combined systolic (congestive) and diastolic (congestive) heart failure: Secondary | ICD-10-CM

## 2012-10-08 DIAGNOSIS — I472 Ventricular tachycardia: Secondary | ICD-10-CM

## 2012-10-10 LAB — REMOTE ICD DEVICE
BATTERY VOLTAGE: 3.1832 V
DEV-0020ICD: NEGATIVE
FVT: 0
HV IMPEDENCE: 62 Ohm
RV LEAD IMPEDENCE ICD: 456 Ohm
RV LEAD THRESHOLD: 1 V
TZAT-0001FASTVT: 1
TZAT-0001SLOWVT: 1
TZAT-0001SLOWVT: 2
TZAT-0002FASTVT: NEGATIVE
TZAT-0004SLOWVT: 8
TZAT-0004SLOWVT: 8
TZAT-0012FASTVT: 200 ms
TZAT-0012SLOWVT: 200 ms
TZAT-0013SLOWVT: 2
TZAT-0013SLOWVT: 2
TZAT-0018FASTVT: NEGATIVE
TZAT-0018SLOWVT: NEGATIVE
TZAT-0019SLOWVT: 8 V
TZAT-0019SLOWVT: 8 V
TZAT-0020SLOWVT: 1.5 ms
TZAT-0020SLOWVT: 1.5 ms
TZON-0003VSLOWVT: 400 ms
TZON-0004SLOWVT: 16
TZON-0004VSLOWVT: 20
TZST-0001FASTVT: 4
TZST-0001FASTVT: 6
TZST-0001SLOWVT: 3
TZST-0001SLOWVT: 5
TZST-0002FASTVT: NEGATIVE
TZST-0002FASTVT: NEGATIVE
TZST-0002FASTVT: NEGATIVE
TZST-0002FASTVT: NEGATIVE
TZST-0003SLOWVT: 35 J
TZST-0003SLOWVT: 35 J
VF: 0

## 2012-11-05 ENCOUNTER — Other Ambulatory Visit: Payer: Self-pay

## 2012-11-05 NOTE — Telephone Encounter (Signed)
KLOR-CON M20 20 MEQ tablet 30 each 5 10/04/2012    Sig:  take 1 tablet by mouth once daily  Class:  Normal  DAW:  No  Authorizing Provider:  Peter M Swaziland, MD  Ordering User:  Harriett Sine, CNA

## 2012-11-06 ENCOUNTER — Encounter: Payer: Self-pay | Admitting: *Deleted

## 2012-12-19 ENCOUNTER — Encounter: Payer: Self-pay | Admitting: Internal Medicine

## 2012-12-19 ENCOUNTER — Other Ambulatory Visit: Payer: Self-pay | Admitting: Emergency Medicine

## 2012-12-19 ENCOUNTER — Ambulatory Visit (INDEPENDENT_AMBULATORY_CARE_PROVIDER_SITE_OTHER): Payer: Medicare Other | Admitting: Internal Medicine

## 2012-12-19 VITALS — BP 131/81 | HR 90 | Ht 72.0 in | Wt 259.0 lb

## 2012-12-19 DIAGNOSIS — I509 Heart failure, unspecified: Secondary | ICD-10-CM

## 2012-12-19 DIAGNOSIS — Z95810 Presence of automatic (implantable) cardiac defibrillator: Secondary | ICD-10-CM

## 2012-12-19 DIAGNOSIS — I739 Peripheral vascular disease, unspecified: Secondary | ICD-10-CM | POA: Insufficient documentation

## 2012-12-19 DIAGNOSIS — I472 Ventricular tachycardia: Secondary | ICD-10-CM

## 2012-12-19 DIAGNOSIS — I5042 Chronic combined systolic (congestive) and diastolic (congestive) heart failure: Secondary | ICD-10-CM

## 2012-12-19 LAB — ICD DEVICE OBSERVATION
BATTERY VOLTAGE: 3.1491 V
BRDY-0002RV: 40 {beats}/min
CHARGE TIME: 8.718 s
RV LEAD AMPLITUDE: 20 mv
RV LEAD IMPEDENCE ICD: 437 Ohm
RV LEAD THRESHOLD: 1 V
TOT-0001: 1
TOT-0006: 20120827000000
TZAT-0001FASTVT: 1
TZAT-0011SLOWVT: 10 ms
TZAT-0011SLOWVT: 10 ms
TZAT-0012SLOWVT: 200 ms
TZAT-0012SLOWVT: 200 ms
TZAT-0013SLOWVT: 2
TZAT-0013SLOWVT: 2
TZAT-0018SLOWVT: NEGATIVE
TZAT-0018SLOWVT: NEGATIVE
TZAT-0019SLOWVT: 8 V
TZAT-0019SLOWVT: 8 V
TZAT-0020FASTVT: 1.5 ms
TZON-0003SLOWVT: 360 ms
TZON-0004SLOWVT: 32
TZON-0005SLOWVT: 12
TZST-0001FASTVT: 2
TZST-0001FASTVT: 3
TZST-0001FASTVT: 4
TZST-0001SLOWVT: 4
TZST-0002FASTVT: NEGATIVE
TZST-0003SLOWVT: 35 J
TZST-0003SLOWVT: 35 J
TZST-0003SLOWVT: 35 J
VENTRICULAR PACING ICD: 0.01 pct
VF: 0

## 2012-12-19 NOTE — Assessment & Plan Note (Signed)
contiuecurrent meds

## 2012-12-19 NOTE — Progress Notes (Signed)
Patient Care Team: Jolene Provost as PCP - General (Family Medicine)   HPI  Walter French is a 67 y.o. 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 He is status post ICD implantation originally implanted in 1997 and changed out subsequently  He underwent generator replacement aug 2012.  He underwent redo CABG in 2007 and then subsequent angioplasty of the IMA anastomosis to the LAD in 2008. Ejection fraction at that time was 50%.    h.  Because of symptoms of chest pain and shortness of breath: Myoview scanning which was abnormal and subsequently underwent catheterization.  He had patent SVG to PD, patent SVG to PL, patent LIMA to LAD with a moderate severe stenosis at the anastomosis site and moderate LV dysfunction. Medical therapy was recommended  That is somewhat better. His major complaint now is affecting his right leg when he walks  Past Medical History  Diagnosis Date  . Hyperlipidemia   . Hypertension   . Coronary heart disease   . Diabetes mellitus     on insulin  . OSA (obstructive sleep apnea)   . Obesity   . CHF (congestive heart failure)   . VT (ventricular tachycardia)   . SVT (supraventricular tachycardia)     Past Surgical History  Procedure Date  . Coronary artery bypass graft 1610,9604  . Tonsillectomy   . Right hand surgery     Current Outpatient Prescriptions  Medication Sig Dispense Refill  . allopurinol (ZYLOPRIM) 300 MG tablet Take 300 mg by mouth daily.        [name] aspirin 325 MG tablet Take 325 mg by mouth daily.      [name] atorvastatin (LIPITOR) 40 MG tablet Take 1 tablet (40 mg total) by mouth daily.  30 tablet  5  . colchicine 0.6 MG tablet Take 0.6 mg by mouth 4 (four) times daily as needed. For gout flares      . fenofibrate 160 MG tablet Take 160 mg by mouth daily.        [name] HYDROcodone-acetaminophen  (VICODIN) 5-500 MG per tablet Take 1 tablet by mouth every 6 (six) hours as needed. For pain      . insulin detemir (LEVEMIR) 100 UNIT/ML injection Inject 75 Units into the skin 2 (two) times daily.       [name] KLOR-CON M20 20 MEQ tablet take 1 tablet by mouth once daily  30 each  5  . Liraglutide (VICTOZA [place]) Inject 18 Units into the skin daily.       . metoprolol succinate (TOPROL-XL) 100 MG 24 hr tablet Take 1 tablet (100 mg total) by mouth daily.  90 tablet  3  . ranitidine (ZANTAC) 300 MG tablet Take 1 tablet (300 mg total) by mouth daily. As needed  30 tablet  5  . valsartan (DIOVAN) 160 MG tablet Take 1 tablet (160 mg total) by mouth daily.  30 tablet  5  . glipiZIDE (GLUCOTROL) 10 MG tablet       . NOVOTWIST 32G X 5 MM MISC         Allergies  Allergen Reactions  . Morphine And Related Nausea And Vomiting    Review of Systems negative except from HPI and PMH  Physical Exam BP 131/81  Pulse 90  Ht 6' (1.829 m)  Wt 259 lb (117.482 kg)  BMI 35.13 kg/m2 Well developed and well nourished in no acute distress  HENT normal E scleral and icterus clear Neck Supple JVP flat; carotids brisk and full Clear to ausculation  Regular rate and rhythm, no murmurs gallops or rub Soft with active bowel sounds No clubbing cyanosis none Edema Alert and oriented, grossly normal motor and sensory function Skin Warm and Dry    Assessment and  Plan

## 2012-12-19 NOTE — Patient Instructions (Signed)
Your physician has recommended you make the following change in your medication:  1) Decrease aspirin to 81 mg once daily.  Your physician has requested that you have a lower extremity arterial doppler. There are no restrictions or special instructions  Remote monitoring is used to monitor your Pacemaker of ICD from home. This monitoring reduces the number of office visits required to check your device to one time per year. It allows [place] to keep an eye on the functioning of your device to ensure it is working properly. You are scheduled for a device check from home on 03/18/13. You may send your transmission at any time that day. If you have a wireless device, the transmission will be sent automatically. After your physician reviews your transmission, you will receive a postcard with your next transmission date.   Your physician wants you to follow-up in: 1 year with Dr. Graciela Husbands. You will receive a reminder letter in the mail two months in advance. If you don't receive a letter, please call our office to schedule the follow-up appointment.

## 2012-12-19 NOTE — Assessment & Plan Note (Signed)
[name] device was interrogated.  The information was reviewed. No changes were made in the programming.    

## 2012-12-19 NOTE — Assessment & Plan Note (Signed)
With his known vascular disease cramping with exertion this cath strongly suggest peripheral vascular disease. We'll undertake vascular imaging

## 2012-12-19 NOTE — Assessment & Plan Note (Signed)
No intercurrent Ventricular tachycardia  

## 2012-12-24 ENCOUNTER — Encounter (INDEPENDENT_AMBULATORY_CARE_PROVIDER_SITE_OTHER): Payer: Medicare Other

## 2012-12-24 DIAGNOSIS — I70219 Atherosclerosis of native arteries of extremities with intermittent claudication, unspecified extremity: Secondary | ICD-10-CM

## 2012-12-24 DIAGNOSIS — I739 Peripheral vascular disease, unspecified: Secondary | ICD-10-CM

## 2012-12-25 ENCOUNTER — Other Ambulatory Visit: Payer: Self-pay | Admitting: *Deleted

## 2012-12-25 MED ORDER — VALSARTAN 160 MG PO TABS: 160.0000 mg | ORAL_TABLET | Freq: Every day | ORAL | Status: DC

## 2012-12-28 ENCOUNTER — Telehealth: Payer: Self-pay | Admitting: Internal Medicine

## 2012-12-28 ENCOUNTER — Other Ambulatory Visit: Payer: Self-pay

## 2012-12-28 MED ORDER — ATORVASTATIN CALCIUM 40 MG PO TABS: 40.0000 mg | ORAL_TABLET | Freq: Every day | ORAL | Status: DC

## 2012-12-28 NOTE — Telephone Encounter (Signed)
I spoke with the patient and made him aware that his lower arterial study was abnormal. I am waiting for Dr. Graciela Husbands to review. If PV consult is warranted, he is willing to proceed with this.

## 2012-12-28 NOTE — Telephone Encounter (Signed)
Pt would like results of lower arterial

## 2013-01-03 NOTE — Telephone Encounter (Signed)
F/u   Status of message that was sent on 1/17.

## 2013-01-03 NOTE — Telephone Encounter (Signed)
Pt wants Dr Odessa Fleming interpretation of his PV study.  States test was done 2 weeks ago.

## 2013-01-06 NOTE — Telephone Encounter (Signed)
Please schedule him to see [facility] for PVD  [email or web address]

## 2013-01-08 NOTE — Telephone Encounter (Signed)
I left a message for the patient to call. 

## 2013-01-10 NOTE — Telephone Encounter (Signed)
Spoke with pt, aware of dopplers results and appt scheduled with dr Kirke Corin.

## 2013-01-14 ENCOUNTER — Encounter: Payer: Self-pay | Admitting: Cardiology

## 2013-01-14 ENCOUNTER — Ambulatory Visit (INDEPENDENT_AMBULATORY_CARE_PROVIDER_SITE_OTHER): Payer: Medicare Other | Admitting: Cardiology

## 2013-01-14 VITALS — BP 130/70 | HR 83 | Ht 72.0 in | Wt 258.1 lb

## 2013-01-14 DIAGNOSIS — I5042 Chronic combined systolic (congestive) and diastolic (congestive) heart failure: Secondary | ICD-10-CM

## 2013-01-14 DIAGNOSIS — I2510 Atherosclerotic heart disease of native coronary artery without angina pectoris: Secondary | ICD-10-CM

## 2013-01-14 DIAGNOSIS — I739 Peripheral vascular disease, unspecified: Secondary | ICD-10-CM

## 2013-01-14 DIAGNOSIS — I509 Heart failure, unspecified: Secondary | ICD-10-CM

## 2013-01-14 DIAGNOSIS — E785 Hyperlipidemia, unspecified: Secondary | ICD-10-CM

## 2013-01-14 NOTE — Progress Notes (Signed)
Walter French Date of Birth: 13-Nov-1946 Medical Record #[id number]  History of Present Illness: [name] is seen today for a followup visit. He underwent complete cardiac evaluation in August. This included an abnormal Myoview study with an ejection fraction of 32%. His cath showed severe 3VD, patent SVG to PD, patent SVG to PL, patent LIMA to LAD with a moderate severe stenosis at the anastomosis site and moderate LV dysfunction. He had normal right heart pressures. EF is 40 to 45%. He had previous attempt at intervention of the LIMA/LAD anastomosis with only partial success. The anterior apex is scarred. Medical therapy was recommended to be continued. The area of ischemia noted on the stress test correlates with the LCX occlusion. This is not amenable to revascularization.   More recently he has developed symptoms of pain in his right calf with exertion. He is now only able to walk less than 50 feet before he has to stop. It is especially bad when he is trying to carry something. He states he can he walk to his mailbox now. He had lower extremity arterial Doppler studies which demonstrated an ankle-brachial indices of 0.72 on the right with a TBI of 0.46. On the left these were 0.89 and 0.8 respectively. He is scheduled to see Dr. Kirke French this week for further evaluation.  Current Outpatient Prescriptions on File Prior to Visit  Medication Sig Dispense Refill  . allopurinol (ZYLOPRIM) 300 MG tablet Take 300 mg by mouth daily.        [name] aspirin EC 81 MG tablet Take 1 tablet (81 mg total) by mouth daily.      [name] atorvastatin (LIPITOR) 40 MG tablet Take 1 tablet (40 mg total) by mouth daily.  30 tablet  5  . colchicine 0.6 MG tablet Take 0.6 mg by mouth 4 (four) times daily as needed. For gout flares      . fenofibrate 160 MG tablet Take 160 mg by mouth daily.        [name] glipiZIDE (GLUCOTROL) 10 MG tablet       . HYDROcodone-acetaminophen (VICODIN) 5-500 MG per tablet Take 1 tablet by mouth every 6  (six) hours as needed. For pain      . insulin detemir (LEVEMIR) 100 UNIT/ML injection Inject 75 Units into the skin 2 (two) times daily.       [name] KLOR-CON M20 20 MEQ tablet take 1 tablet by mouth once daily  30 each  5  . Liraglutide (VICTOZA [place]) Inject 18 Units into the skin daily.       . metoprolol succinate (TOPROL-XL) 100 MG 24 hr tablet Take 1 tablet (100 mg total) by mouth daily.  90 tablet  3  . NOVOTWIST 32G X 5 MM MISC       . ranitidine (ZANTAC) 300 MG tablet Take 1 tablet (300 mg total) by mouth daily. As needed  30 tablet  5  . valsartan (DIOVAN) 160 MG tablet Take 1 tablet (160 mg total) by mouth daily.  30 tablet  5    Allergies  Allergen Reactions  . Morphine And Related Nausea And Vomiting    Past Medical History  Diagnosis Date  . Hyperlipidemia   . Hypertension   . Coronary heart disease   . Diabetes mellitus     on insulin  . OSA (obstructive sleep apnea)   . Obesity   . CHF (congestive heart failure)   . VT (ventricular tachycardia)   . SVT (supraventricular tachycardia)   .  PAD (peripheral artery disease)     Past Surgical History  Procedure Date  . Coronary artery bypass graft 1610,9604  . Tonsillectomy   . Right hand surgery     History  Smoking status  . Former Smoker -- 2.0 packs/day for 30 years  . Types: Cigarettes  . Quit date: 12/13/1995  Smokeless tobacco  . Never Used    History  Alcohol Use No    Family History  Problem Relation Age of Onset  . Heart disease Brother   . Cancer Brother   . Breast cancer Mother     Review of Systems: The review of systems is per the HPI.  All other systems were reviewed and are negative.  Physical Exam: BP 130/70  Pulse 83  Ht 6' (1.829 m)  Wt 258 lb 1.9 oz (117.082 kg)  BMI 35.01 kg/m2  SpO2 99% Patient is very pleasant and in no acute distress. He is obese. Skin is warm and dry. Color is normal.  HEENT is unremarkable. Normocephalic/atraumatic. PERRL. Sclera are nonicteric. Neck is  supple. No masses. No JVD. Lungs are clear. Cardiac exam shows a regular rate and rhythm. Abdomen is obese but soft. Extremities are without edema. Gait and ROM are intact. No gross neurologic deficits noted.   LABORATORY DATA:  Lab Results  Component Value Date   WBC 8.2 08/02/2012   HGB 14.4 08/02/2012   HCT 43.9 08/02/2012   PLT 145.0* 08/02/2012   GLUCOSE 111* 08/07/2012   NA 136 08/02/2012   K 4.2 08/02/2012   CL 103 08/02/2012   CREATININE 1.3 08/02/2012   BUN 18 08/02/2012   CO2 27 08/02/2012   INR 0.9 08/02/2012     Assessment / Plan:  1. 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 He is to continue with his medical regimen. He is doing well. Tolerating his medicines.   2. HTN - BP is ok. No change in his current regimen.  3. Congestive heart failure with ischemic cardiomyopathy. He appears to be well compensated.  4. PAD with significant right calf claudication. Abnormal Doppler study. We'll followup with Dr. Kirke French. He is not a good candidate for Pletal given his history of congestive heart failure. I expect that he will need an angiogram.  5. History of ventricular tachycardia status post ICD implant  6. Diabetes mellitus on insulin.

## 2013-01-14 NOTE — Patient Instructions (Signed)
Continue your current medication  Follow up with Dr. Kirke Corin on Wednesday  I will see you in 4 months

## 2013-01-16 ENCOUNTER — Encounter: Payer: Self-pay | Admitting: Cardiovascular Disease

## 2013-01-16 ENCOUNTER — Ambulatory Visit (INDEPENDENT_AMBULATORY_CARE_PROVIDER_SITE_OTHER): Payer: Medicare Other | Admitting: Cardiovascular Disease

## 2013-01-16 VITALS — BP 140/70 | HR 95 | Wt 259.0 lb

## 2013-01-16 DIAGNOSIS — I739 Peripheral vascular disease, unspecified: Secondary | ICD-10-CM

## 2013-01-16 DIAGNOSIS — I10 Essential (primary) hypertension: Secondary | ICD-10-CM

## 2013-01-16 DIAGNOSIS — Z01812 Encounter for preprocedural laboratory examination: Secondary | ICD-10-CM

## 2013-01-16 NOTE — Patient Instructions (Addendum)
Your procedure is scheduled for 01/30/13.  Please refer to your letter for instructions.  Your physician recommends that you return for lab work in: today (bmet, cbc, pt/inr)

## 2013-01-16 NOTE — Progress Notes (Signed)
 Primary care physician: Dr. Haimes Cardiologist: Dr. Jordan and Dr. Klein.  HPI  This is a 66-year-old male who was referred by Dr. Klein for evaluation and management of claudication and peripheral arterial disease.  He underwent complete cardiac evaluation in August. This included an abnormal Myoview study with an ejection fraction of 32%. His cath showed severe 3VD, patent SVG to PD, patent SVG to PL, patent LIMA to LAD with a moderate severe stenosis at the anastomosis site and moderate LV dysfunction. He had normal right heart pressures. EF is 40 to 45%. He had previous attempt at intervention of the LIMA/LAD anastomosis with only partial success. Medical therapy was recommended.  More recently he has developed symptoms of pain in his right calf with exertion. This started about 2 months ago and has been progressive since then. He is now only able to walk less than 50 feet before he has to stop. It is especially bad when he is trying to carry something. He states he can barely walk to his mailbox now. He had lower extremity arterial Doppler studies which demonstrated an ankle-brachial indices of 0.72 on the right with a TBI of 0.46. On the left these were 0.89 and 0.8 respectively.   Allergies  Allergen Reactions  . Morphine And Related Nausea And Vomiting     Current Outpatient Prescriptions on File Prior to Visit  Medication Sig Dispense Refill  . allopurinol (ZYLOPRIM) 300 MG tablet Take 300 mg by mouth daily.        . aspirin EC 81 MG tablet Take 1 tablet (81 mg total) by mouth daily.      . atorvastatin (LIPITOR) 40 MG tablet Take 1 tablet (40 mg total) by mouth daily.  30 tablet  5  . colchicine 0.6 MG tablet Take 0.6 mg by mouth 4 (four) times daily as needed. For gout flares      . fenofibrate 160 MG tablet Take 160 mg by mouth daily.        . glipiZIDE (GLUCOTROL) 10 MG tablet Take 10 mg by mouth daily.       . HYDROcodone-acetaminophen (VICODIN) 5-500 MG per tablet Take 1  tablet by mouth every 6 (six) hours as needed. For pain      . insulin detemir (LEVEMIR) 100 UNIT/ML injection Inject 75 Units into the skin 2 (two) times daily.       . KLOR-CON M20 20 MEQ tablet take 1 tablet by mouth once daily  30 each  5  . Liraglutide (VICTOZA [place]) Inject 18 Units into the skin daily.       . metoprolol succinate (TOPROL-XL) 100 MG 24 hr tablet Take 1 tablet (100 mg total) by mouth daily.  90 tablet  3  . ranitidine (ZANTAC) 300 MG tablet Take 1 tablet (300 mg total) by mouth daily. As needed  30 tablet  5  . valsartan (DIOVAN) 160 MG tablet Take 1 tablet (160 mg total) by mouth daily.  30 tablet  5     Past Medical History  Diagnosis Date  . Hyperlipidemia   . Hypertension   . Coronary heart disease   . Diabetes mellitus     on insulin  . OSA (obstructive sleep apnea)   . Obesity   . CHF (congestive heart failure)   . VT (ventricular tachycardia)   . SVT (supraventricular tachycardia)   . PAD (peripheral artery disease)      Past Surgical History  Procedure Date  . Coronary artery bypass   graft 1997,2007  . Tonsillectomy   . Right hand surgery      Family History  Problem Relation Age of Onset  . Heart disease Brother   . Cancer Brother   . Breast cancer Mother      History   Social History  . Marital Status: Single    Spouse Name: N/A    Number of Children: 0  . Years of Education: N/A   Occupational History  . retired truck driver/mechanic   .     Social History Main Topics  . Smoking status: Former Smoker -- 2.0 packs/day for 30 years    Types: Cigarettes    Quit date: 12/13/1995  . Smokeless tobacco: Never Used  . Alcohol Use: No  . Drug Use: Not on file  . Sexually Active: Not on file   Other Topics Concern  . Not on file   Social History Narrative  . No narrative on file     ROS Constitutional: Negative for fever, chills, diaphoresis, activity change, appetite change and fatigue.  HENT: Negative for hearing loss,  nosebleeds, congestion, sore throat, facial swelling, drooling, trouble swallowing, neck pain, voice change, sinus pressure and tinnitus.  Eyes: Negative for photophobia, pain, discharge and visual disturbance.  Respiratory: Negative for apnea, cough, chest tightness and wheezing.  Cardiovascular: Negative for chest pain, palpitations and leg swelling.  Gastrointestinal: Negative for nausea, vomiting, abdominal pain, diarrhea, constipation, blood in stool and abdominal distention.  Genitourinary: Negative for dysuria, urgency, frequency, hematuria and decreased urine volume.   Skin: Negative for color change, pallor, rash and wound.  Neurological: Negative for dizziness, tremors, seizures, syncope, speech difficulty, weakness, light-headedness, numbness and headaches.  Psychiatric/Behavioral: Negative for suicidal ideas, hallucinations, behavioral problems and agitation. The patient is not nervous/anxious.     PHYSICAL EXAM   BP 140/70  Pulse 95  Wt 259 lb (117.482 kg) Constitutional: He is oriented to person, place, and time. He appears well-developed and well-nourished. No distress.  HENT: No nasal discharge.  Head: Normocephalic and atraumatic.  Eyes: Pupils are equal and round. Right eye exhibits no discharge. Left eye exhibits no discharge.  Neck: Normal range of motion. Neck supple. No JVD present. No thyromegaly present.  Cardiovascular: Normal rate, regular rhythm, normal heart sounds and. Exam reveals no gallop and no friction rub. No murmur heard.  Pulmonary/Chest: Effort normal and breath sounds normal. No stridor. No respiratory distress. He has no wheezes. He has no rales. He exhibits no tenderness.  Abdominal: Soft. Bowel sounds are normal. He exhibits no distension. There is no tenderness. There is no rebound and no guarding.  Musculoskeletal: Normal range of motion. He exhibits no edema and no tenderness.  Neurological: He is alert and oriented to person, place, and time.  Coordination normal.  Skin: Skin is warm and dry. No rash noted. He is not diaphoretic. No erythema. No pallor.  Psychiatric: He has a normal mood and affect. His behavior is normal. Judgment and thought content normal.  Vascular: Femoral pulses are mildly diminished bilaterally. Distal pulses are not palpable.     EKG: normal sinus rhythm with first degree AV block and a PVC. Left atrial enlargement, possible old anteroseptal infarct.   ASSESSMENT AND PLAN   

## 2013-01-16 NOTE — Assessment & Plan Note (Signed)
Walter French is currently suffering from severe claudication involving the right calf which is lifestyle limiting. Claudication distance is less than 50 feet and then he has to rest for about 5 minutes before he can resume. The ABI was mildly reduced on the right side but might be overestimated due to calcifications. His symptoms definitely suggest worse disease than the ABI. Arterial duplex ultrasound showed mild bilateral SFA disease with severe stenosis in the right popliteal artery. I discussed with the patient different management options including a walking program and medical therapy first versus proceeding with angiography and possible endovascular intervention. He is currently extremely limited and does not think can do much walking with current symptoms. Unfortunately, given his history of heart failure, I am not able to attempt treatment with Pletal. Due to all of that, I would proceed with abdominal angiography, lower extremity runoff and possible angioplasty. I will plan access from the left femoral artery.

## 2013-01-17 LAB — CBC WITH DIFFERENTIAL/PLATELET
Basophils Absolute: 0 10*3/uL (ref 0.0–0.1)
Eosinophils Absolute: 0.4 10*3/uL (ref 0.0–0.7)
HCT: 42.4 % (ref 39.0–52.0)
Hemoglobin: 14.2 g/dL (ref 13.0–17.0)
Lymphs Abs: 2.5 10*3/uL (ref 0.7–4.0)
MCHC: 33.5 g/dL (ref 30.0–36.0)
Monocytes Absolute: 0.8 10*3/uL (ref 0.1–1.0)
Neutro Abs: 5.1 10*3/uL (ref 1.4–7.7)
RDW: 14.4 % (ref 11.5–14.6)

## 2013-01-17 LAB — BASIC METABOLIC PANEL
CO2: 28 mEq/L (ref 19–32)
Glucose, Bld: 160 mg/dL — ABNORMAL HIGH (ref 70–99)
Potassium: 4.5 mEq/L (ref 3.5–5.1)
Sodium: 136 mEq/L (ref 135–145)

## 2013-01-23 ENCOUNTER — Encounter (HOSPITAL_COMMUNITY): Payer: Self-pay | Admitting: Pharmacy Technician

## 2013-01-30 ENCOUNTER — Ambulatory Visit (HOSPITAL_COMMUNITY)
Admission: RE | Admit: 2013-01-30 | Discharge: 2013-01-30 | Disposition: A | Discharge status: Home / Self Care | Payer: Medicare Other | Source: Ambulatory Visit | Attending: Cardiovascular Disease | Admitting: Cardiovascular Disease

## 2013-01-30 ENCOUNTER — Other Ambulatory Visit: Payer: Self-pay | Admitting: Cardiovascular Disease

## 2013-01-30 ENCOUNTER — Encounter (HOSPITAL_COMMUNITY)
Admission: RE | Disposition: A | Discharge status: Home / Self Care | Payer: Self-pay | Source: Ambulatory Visit | Attending: Cardiovascular Disease

## 2013-01-30 DIAGNOSIS — I70219 Atherosclerosis of native arteries of extremities with intermittent claudication, unspecified extremity: Secondary | ICD-10-CM | POA: Insufficient documentation

## 2013-01-30 DIAGNOSIS — I509 Heart failure, unspecified: Secondary | ICD-10-CM | POA: Insufficient documentation

## 2013-01-30 DIAGNOSIS — I10 Essential (primary) hypertension: Secondary | ICD-10-CM | POA: Insufficient documentation

## 2013-01-30 DIAGNOSIS — I708 Atherosclerosis of other arteries: Secondary | ICD-10-CM | POA: Insufficient documentation

## 2013-01-30 DIAGNOSIS — E119 Type 2 diabetes mellitus without complications: Secondary | ICD-10-CM | POA: Insufficient documentation

## 2013-01-30 HISTORY — PX: ABDOMINAL AORTAGRAM: SHX5454

## 2013-01-30 LAB — POCT ACTIVATED CLOTTING TIME: Activated Clotting Time: 214 seconds

## 2013-01-30 LAB — GLUCOSE, CAPILLARY: Glucose-Capillary: 114 mg/dL — ABNORMAL HIGH (ref 70–99)

## 2013-01-30 SURGERY — ABDOMINAL AORTAGRAM
Anesthesia: LOCAL | Laterality: Right

## 2013-01-30 MED ORDER — SODIUM CHLORIDE 0.9 % IJ SOLN: 3.0000 mL | Freq: Two times a day (BID) | INTRAMUSCULAR | Status: DC

## 2013-01-30 MED ORDER — ASPIRIN 81 MG PO CHEW
324.0000 mg | CHEWABLE_TABLET | ORAL | Status: AC
  Administered 2013-01-30: 324 mg via ORAL
  Filled 2013-01-30: qty 4

## 2013-01-30 MED ORDER — HEPARIN SODIUM (PORCINE) 1000 UNIT/ML IJ SOLN
INTRAMUSCULAR | Status: AC
  Filled 2013-01-30: qty 1

## 2013-01-30 MED ORDER — ONDANSETRON HCL 4 MG/2ML IJ SOLN: 4.0000 mg | Freq: Four times a day (QID) | INTRAMUSCULAR | Status: DC | PRN

## 2013-01-30 MED ORDER — SODIUM CHLORIDE 0.9 % IJ SOLN: 3.0000 mL | INTRAMUSCULAR | Status: DC | PRN

## 2013-01-30 MED ORDER — ACETAMINOPHEN 325 MG PO TABS: 650.0000 mg | ORAL_TABLET | ORAL | Status: DC | PRN

## 2013-01-30 MED ORDER — SODIUM CHLORIDE 0.9 % IV SOLN
INTRAVENOUS | Status: DC
  Administered 2013-01-30: 75 mL/h via INTRAVENOUS

## 2013-01-30 MED ORDER — CLOPIDOGREL BISULFATE 300 MG PO TABS
ORAL_TABLET | ORAL | Status: AC
  Filled 2013-01-30: qty 2

## 2013-01-30 MED ORDER — SODIUM CHLORIDE 0.9 % IV SOLN: 250.0000 mL | INTRAVENOUS | Status: DC | PRN

## 2013-01-30 MED ORDER — LIDOCAINE HCL (PF) 1 % IJ SOLN
INTRAMUSCULAR | Status: AC
  Filled 2013-01-30: qty 30

## 2013-01-30 MED ORDER — SODIUM CHLORIDE 0.9 % IV SOLN: INTRAVENOUS | Status: DC

## 2013-01-30 MED ORDER — MIDAZOLAM HCL 2 MG/2ML IJ SOLN
INTRAMUSCULAR | Status: AC
  Filled 2013-01-30: qty 2

## 2013-01-30 MED ORDER — CLOPIDOGREL BISULFATE 75 MG PO TABS: 75.0000 mg | ORAL_TABLET | Freq: Every day | ORAL | Status: DC

## 2013-01-30 NOTE — H&P (View-Only) (Signed)
Primary care physician: Dr. William Hamburger Cardiologist: Dr. [place] and Dr. Graciela Husbands.  HPI  This is a 67 year old male who was referred by Dr. Graciela Husbands for evaluation and management of claudication and peripheral arterial disease.  He underwent complete cardiac evaluation in August. This included an abnormal Myoview study with an ejection fraction of 32%. His cath showed severe 3VD, patent SVG to PD, patent SVG to PL, patent LIMA to LAD with a moderate severe stenosis at the anastomosis site and moderate LV dysfunction. He had normal right heart pressures. EF is 40 to 45%. He had previous attempt at intervention of the LIMA/LAD anastomosis with only partial success. Medical therapy was recommended.  More recently he has developed symptoms of pain in his right calf with exertion. This started about 2 months ago and has been progressive since then. He is now only able to walk less than 50 feet before he has to stop. It is especially bad when he is trying to carry something. He states he can barely walk to his mailbox now. He had lower extremity arterial Doppler studies which demonstrated an ankle-brachial indices of 0.72 on the right with a TBI of 0.46. On the left these were 0.89 and 0.8 respectively.   Allergies  Allergen Reactions  . Morphine And Related Nausea And Vomiting     Current Outpatient Prescriptions on File Prior to Visit  Medication Sig Dispense Refill  . allopurinol (ZYLOPRIM) 300 MG tablet Take 300 mg by mouth daily.        [name] aspirin EC 81 MG tablet Take 1 tablet (81 mg total) by mouth daily.      [name] atorvastatin (LIPITOR) 40 MG tablet Take 1 tablet (40 mg total) by mouth daily.  30 tablet  5  . colchicine 0.6 MG tablet Take 0.6 mg by mouth 4 (four) times daily as needed. For gout flares      . fenofibrate 160 MG tablet Take 160 mg by mouth daily.        [name] glipiZIDE (GLUCOTROL) 10 MG tablet Take 10 mg by mouth daily.       [name] HYDROcodone-acetaminophen (VICODIN) 5-500 MG per tablet Take 1  tablet by mouth every 6 (six) hours as needed. For pain      . insulin detemir (LEVEMIR) 100 UNIT/ML injection Inject 75 Units into the skin 2 (two) times daily.       [name] KLOR-CON M20 20 MEQ tablet take 1 tablet by mouth once daily  30 each  5  . Liraglutide (VICTOZA [place]) Inject 18 Units into the skin daily.       . metoprolol succinate (TOPROL-XL) 100 MG 24 hr tablet Take 1 tablet (100 mg total) by mouth daily.  90 tablet  3  . ranitidine (ZANTAC) 300 MG tablet Take 1 tablet (300 mg total) by mouth daily. As needed  30 tablet  5  . valsartan (DIOVAN) 160 MG tablet Take 1 tablet (160 mg total) by mouth daily.  30 tablet  5     Past Medical History  Diagnosis Date  . Hyperlipidemia   . Hypertension   . Coronary heart disease   . Diabetes mellitus     on insulin  . OSA (obstructive sleep apnea)   . Obesity   . CHF (congestive heart failure)   . VT (ventricular tachycardia)   . SVT (supraventricular tachycardia)   . PAD (peripheral artery disease)      Past Surgical History  Procedure Date  . Coronary artery bypass  graft [id number]  . Tonsillectomy   . Right hand surgery      Family History  Problem Relation Age of Onset  . Heart disease Brother   . Cancer Brother   . Breast cancer Mother      History   Social History  . Marital Status: Single    Spouse Name: N/A    Number of Children: 0  . Years of Education: N/A   Occupational History  . retired Product/process development scientist   .     Social History Main Topics  . Smoking status: Former Smoker -- 2.0 packs/day for 30 years    Types: Cigarettes    Quit date: 12/13/1995  . Smokeless tobacco: Never Used  . Alcohol Use: No  . Drug Use: Not on file  . Sexually Active: Not on file   Other Topics Concern  . Not on file   Social History Narrative  . No narrative on file     ROS Constitutional: Negative for fever, chills, diaphoresis, activity change, appetite change and fatigue.  HENT: Negative for hearing loss,  nosebleeds, congestion, sore throat, facial swelling, drooling, trouble swallowing, neck pain, voice change, sinus pressure and tinnitus.  Eyes: Negative for photophobia, pain, discharge and visual disturbance.  Respiratory: Negative for apnea, cough, chest tightness and wheezing.  Cardiovascular: Negative for chest pain, palpitations and leg swelling.  Gastrointestinal: Negative for nausea, vomiting, abdominal pain, diarrhea, constipation, blood in stool and abdominal distention.  Genitourinary: Negative for dysuria, urgency, frequency, hematuria and decreased urine volume.   Skin: Negative for color change, pallor, rash and wound.  Neurological: Negative for dizziness, tremors, seizures, syncope, speech difficulty, weakness, light-headedness, numbness and headaches.  Psychiatric/Behavioral: Negative for suicidal ideas, hallucinations, behavioral problems and agitation. The patient is not nervous/anxious.     PHYSICAL EXAM   BP 140/70  Pulse 95  Wt 259 lb (117.482 kg) Constitutional: He is oriented to person, place, and time. He appears well-developed and well-nourished. No distress.  HENT: No nasal discharge.  Head: Normocephalic and atraumatic.  Eyes: Pupils are equal and round. Right eye exhibits no discharge. Left eye exhibits no discharge.  Neck: Normal range of motion. Neck supple. No JVD present. No thyromegaly present.  Cardiovascular: Normal rate, regular rhythm, normal heart sounds and. Exam reveals no gallop and no friction rub. No murmur heard.  Pulmonary/Chest: Effort normal and breath sounds normal. No stridor. No respiratory distress. He has no wheezes. He has no rales. He exhibits no tenderness.  Abdominal: Soft. Bowel sounds are normal. He exhibits no distension. There is no tenderness. There is no rebound and no guarding.  Musculoskeletal: Normal range of motion. He exhibits no edema and no tenderness.  Neurological: He is alert and oriented to person, place, and time.  Coordination normal.  Skin: Skin is warm and dry. No rash noted. He is not diaphoretic. No erythema. No pallor.  Psychiatric: He has a normal mood and affect. His behavior is normal. Judgment and thought content normal.  Vascular: Femoral pulses are mildly diminished bilaterally. Distal pulses are not palpable.     EKG: normal sinus rhythm with first degree AV block and a PVC. Left atrial enlargement, possible old anteroseptal infarct.   ASSESSMENT AND PLAN

## 2013-01-30 NOTE — CV Procedure (Signed)
PERIPHERAL VASCULAR PROCEDURE  NAME:  Walter French   MRN: [id number] DOB:  1946-09-29   ADMIT DATE: 01/30/2013  Performing Cardiologist: Lorine Bears Primary Physician: Jolene Provost, MD Primary Cardiologist:  Dr. [place] /Dr. Graciela Husbands  Procedures Performed:  Abdominal Aortic Angiogram with bilateral iliac angiography   Selective right lower extremity arterial angiography  Right popliteal artery angioplasty.  Right popliteal artery self-expanding stent placement.   Indication(s):   Claudication    Consent: The procedure with Risks/Benefits/Alternatives and Indications was reviewed with the patient  and family).  All questions were answered.  Medications:  Sedation:  2 mg IV Versed, 0 mcg IV Fentanyl  Contrast:  110 mL of Visipaque   Procedural details: The left groin was prepped, draped, and anesthetized with 1% lidocaine. Using modified Seldinger technique, a 5 French sheath was introduced into the left common femoral artery. A 5 Fr Short Pigtail Catheter was advanced of over a  Versicore wire into the descending Aorta to a level just above the renal arteries. A power injection of 58ml/sec contrast over 1 sec was performed for Abdominal Aortic Angiography.  The catheter was then pulled back to a level just above the Aortic bifurcation, and a second power injection was performed to evaluate bi-ileiofemoral arteries.   The pigtail catheter was a change over the versicore wire for A crossover catheter which was then pulled back the aortic bifurcation and the wire was advanced down the contralateral common iliac artery. The wire was advanced but could not reach the external iliac due to calcifications and tortuosity. Thus, I used a Glidewire which was advanced to the right SFA but did not give enough support. A straight tip catheter was advanced slightly. The Glidewire was changed to the [employer] wire. I was able to advance a straight tip catheter to the distal external iliac  artery. Contralateral second-order lower extremity angiography was performed via power injection of 5 ml / sec contrast for a total of 35 ml.    Interventional Procedure:  An Amplatzer wire was advanced. The straight tip catheter and a 5 French sheath were removed. A 45 cm 6 [place] sheath was advanced and placed with its tip in the right common femoral artery. 5000 units of unfractionated heparin was given with an ACT of 214. I then advanced a 4 x 20 mm Angioscore balloon over an 018 wire and passed the lesion in the proximal right popliteal artery. The lesion was predilated to 6 atmospheres. The lesion was significantly calcified with significant recoil. I was concerned that with further predilation I might dissect down the popliteal artery and thus at this point I decided to stent with a 6 mm x 30 mm Smart self-expanding stent which was post dilated with a 6 mm balloon. Angiography showed excellent results. The wire was then removed. The sheath was pulled back over an 035 wire.  I then performed a pullback across the lesion in the left common iliac artery with a straight tip catheter. No significant gradient was noted. Femoral angiography showed good position of the sheath. The sheath was exchanged into a 6 French sheath and the site was then closed with a Mynx closure device  Hemodynamics:  Central Aortic Pressure / Mean Aortic Pressure: 137/81  Findings:  Abdominal aorta: Normal in size with minor irregularities and no evidence of aneurysm.  Left renal artery: Not well visualized due to overlap with the SMA. There seems to be a 30-40% ostial stenosis.  Right renal artery:  Mild atherosclerosis.  Celiac artery: Patent  Superior mesenteric artery: Patent  Right common iliac artery: Normal in size with 30% ostial disease.  Right internal iliac artery: Patent  Right external iliac artery: Minor irregularities  Right common femoral artery: 20% disease is noted.  Right  profunda femoral artery: Normal in size with minor irregularities.  Right superficial femoral artery: The vessel has extensive calcification is noted in the proximal and midsegment. There is a 40-50% ostial stenosis. There is diffuse 30% disease in the proximal and midsegment with calcifications.  Right popliteal artery: There is a discrete 90% stenosis proximally  Below the knee arteries are not well visualized due to motion artifact but there appears to be three-vessel runoff to the foot.  Left common iliac artery:  There is a 50-60% ostial stenosis with no significant pressure gradient noted with pullback.  Left internal iliac artery: Normal  Left external iliac artery: Normal  Left common femoral artery: Minor irregularities.   Conclusions: 1. Moderate left iliac artery stenosis. 2. Mild to moderate diffuse right SFA disease. 3. Severe proximal right popliteal artery stenosis with three-vessel runoff below the knee. 4. Successful angioplasty and self-expanding stent placement to the proximal popliteal.  Recommendations:  Dual antiplatelet therapy for one month. Aggressive treatment of risk factors is recommended.   Lorine Bears, MD, [facility] 01/30/2013 12:41 PM

## 2013-01-30 NOTE — Interval H&P Note (Signed)
History and Physical Interval Note:  01/30/2013 11:10 AM  Walter French  has presented today for surgery, with the diagnosis of PAD  The various methods of treatment have been discussed with the patient and family. After consideration of risks, benefits and other options for treatment, the patient has consented to  Procedure(s): ABDOMINAL AORTAGRAM (N/A) as a surgical intervention .  [name] history has been reviewed, patient examined, no change in status, stable for surgery.  I have reviewed the patient's chart and labs.  Questions were answered to the patient's satisfaction.     Lorine Bears

## 2013-01-31 ENCOUNTER — Telehealth: Payer: Self-pay | Admitting: Cardiovascular Disease

## 2013-01-31 MED ORDER — CLOPIDOGREL BISULFATE 75 MG PO TABS: 75.0000 mg | ORAL_TABLET | Freq: Every day | ORAL | Status: DC

## 2013-01-31 NOTE — Telephone Encounter (Signed)
Rx was resent an pt was notified of this.

## 2013-01-31 NOTE — Telephone Encounter (Signed)
Plavix 75 mg once daily for 1 month.

## 2013-01-31 NOTE — Telephone Encounter (Signed)
Pt had procedure yesterday and was told a blood thinner was gonna be called in and when he got to Pharm it was not there can you please call it in at [facility] on [address]

## 2013-01-31 NOTE — Telephone Encounter (Signed)
Will forward to Dr Kirke Corin to find out what rx he needs.

## 2013-02-14 ENCOUNTER — Ambulatory Visit (INDEPENDENT_AMBULATORY_CARE_PROVIDER_SITE_OTHER): Payer: Medicare Other | Admitting: Cardiology

## 2013-02-14 DIAGNOSIS — I739 Peripheral vascular disease, unspecified: Secondary | ICD-10-CM

## 2013-02-20 ENCOUNTER — Encounter: Payer: Self-pay | Admitting: Cardiovascular Disease

## 2013-02-20 ENCOUNTER — Ambulatory Visit (INDEPENDENT_AMBULATORY_CARE_PROVIDER_SITE_OTHER): Payer: Medicare Other | Admitting: Cardiovascular Disease

## 2013-02-20 VITALS — BP 132/81 | HR 89 | Ht 68.0 in | Wt 259.8 lb

## 2013-02-20 NOTE — Patient Instructions (Addendum)
Your physician wants you to follow-up in: 6 months.   You will receive a reminder letter in the mail two months in advance. If you don't receive a letter, please call our office to schedule the follow-up appointment.  Your physician has requested that you have a lower extremity arterial duplex in 6 months. This test is an ultrasound of the arteries in the legs. It looks at arterial blood flow in the legs and arms. Allow one hour for Lower Arterial scans. There are no restrictions or special instructions  Your physician has recommended you make the following change in your medication: STOP your PLAVIX when your prescription runs out

## 2013-02-21 ENCOUNTER — Encounter: Payer: Self-pay | Admitting: Cardiovascular Disease

## 2013-02-21 NOTE — Progress Notes (Signed)
Primary care physician: Dr. William Hamburger Cardiologist: Dr. [place] and Dr. Graciela Husbands.  HPI  This is a 67 year old male is here today for a followup visit regarding peripheral arterial disease. He has known history of coronary artery disease status post CABG and ischemic cardiomyopathy.  He recently developed symptoms of pain in his right calf with exertion. This was happening with 50 feet of walking. He had lower extremity arterial Doppler studies which demonstrated an ankle-brachial indices of 0.72 on the right with a TBI of 0.46. On the left these were 0.89 and 0.8 respectively.  I performed lower extremity angiography which showed moderate left common iliac artery stenosis. On the right side, there was mild to moderate diffuse SFA disease and severe proximal popliteal artery stenosis with three-vessel runoff below the knee. I placed a self-expanding stent to the proximal popliteal artery without complications. He reports complete resolution of claudication.  Allergies  Allergen Reactions  . Morphine And Related Nausea And Vomiting     Current Outpatient Prescriptions on File Prior to Visit  Medication Sig Dispense Refill  . allopurinol (ZYLOPRIM) 300 MG tablet Take 300 mg by mouth daily.        [name] aspirin EC 81 MG tablet Take 1 tablet (81 mg total) by mouth daily.      [name] atorvastatin (LIPITOR) 40 MG tablet Take 1 tablet (40 mg total) by mouth daily.  30 tablet  5  . clopidogrel (PLAVIX) 75 MG tablet Take 1 tablet (75 mg total) by mouth daily.  30 tablet  0  . colchicine 0.6 MG tablet Take 0.6 mg by mouth 4 (four) times daily as needed. For gout flares      . fenofibrate 160 MG tablet Take 160 mg by mouth daily.        [name] glipiZIDE (GLUCOTROL) 10 MG tablet Take 10 mg by mouth daily.       . insulin detemir (LEVEMIR) 100 UNIT/ML injection Inject 75 Units into the skin 2 (two) times daily.       [name] KLOR-CON M20 20 MEQ tablet take 1 tablet by mouth once daily  30 each  5  . Liraglutide (VICTOZA [place])  Inject 18 Units into the skin daily.       . metoprolol succinate (TOPROL-XL) 100 MG 24 hr tablet Take 1 tablet (100 mg total) by mouth daily.  90 tablet  3  . ranitidine (ZANTAC) 300 MG tablet Take 1 tablet (300 mg total) by mouth daily. As needed  30 tablet  5  . valsartan (DIOVAN) 160 MG tablet Take 1 tablet (160 mg total) by mouth daily.  30 tablet  5   No current facility-administered medications on file prior to visit.     Past Medical History  Diagnosis Date  . Hyperlipidemia   . Hypertension   . Coronary heart disease   . Diabetes mellitus     on insulin  . OSA (obstructive sleep apnea)   . Obesity   . CHF (congestive heart failure)   . VT (ventricular tachycardia)   . SVT (supraventricular tachycardia)   . PAD (peripheral artery disease)     Angiography in February of 2014: Moderate left iliac artery stenosis, mild to moderate diffuse right SFA disease. Severe proximal right popliteal artery stenosis with three-vessel runoff below the knee. Status post self-expanding stent placement to the proximal popliteal artery     Past Surgical History  Procedure Laterality Date  . Coronary artery bypass graft  6578,4696  . Tonsillectomy    .  Right hand surgery       Family History  Problem Relation Age of Onset  . Heart disease Brother   . Cancer Brother   . Breast cancer Mother      History   Social History  . Marital Status: Single    Spouse Name: N/A    Number of Children: 0  . Years of Education: N/A   Occupational History  . retired Product/process development scientist   .     Social History Main Topics  . Smoking status: Former Smoker -- 2.00 packs/day for 30 years    Types: Cigarettes    Quit date: 12/13/1995  . Smokeless tobacco: Never Used  . Alcohol Use: No  . Drug Use: Not on file  . Sexually Active: Not on file   Other Topics Concern  . Not on file   Social History Narrative  . No narrative on file       PHYSICAL EXAM   BP 132/81  Pulse 89  Ht  5\' 8"  (1.727 m)  Wt 259 lb 12.8 oz (117.845 kg)  BMI 39.51 kg/m2 Constitutional: He is oriented to person, place, and time. He appears well-developed and well-nourished. No distress.  HENT: No nasal discharge.  Head: Normocephalic and atraumatic.  Eyes: Pupils are equal and round. Right eye exhibits no discharge. Left eye exhibits no discharge.  Neck: Normal range of motion. Neck supple. No JVD present. No thyromegaly present.  Cardiovascular: Normal rate, regular rhythm, normal heart sounds and. Exam reveals no gallop and no friction rub. No murmur heard.  Pulmonary/Chest: Effort normal and breath sounds normal. No stridor. No respiratory distress. He has no wheezes. He has no rales. He exhibits no tenderness.  Abdominal: Soft. Bowel sounds are normal. He exhibits no distension. There is no tenderness. There is no rebound and no guarding.  Musculoskeletal: Normal range of motion. He exhibits no edema and no tenderness.  Neurological: He is alert and oriented to person, place, and time. Coordination normal.  Skin: Skin is warm and dry. No rash noted. He is not diaphoretic. No erythema. No pallor.  Psychiatric: He has a normal mood and affect. His behavior is normal. Judgment and thought content normal.  Vascular: Femoral pulses are mildly diminished bilaterally. No groin hematoma.  Distal pulses are faint bilaterally      ASSESSMENT AND PLAN

## 2013-02-21 NOTE — Assessment & Plan Note (Signed)
He is doing well after a recent stent placement to the proximal right popliteal artery. He reports complete resolution of claudication. Post procedure ABI showed significant improvement on the right side at 0.92. Continue treatment with Plavix for a total length of one month. Followup lower extremity arterial duplex and ABI in 6 months from now.

## 2013-03-14 ENCOUNTER — Other Ambulatory Visit: Payer: Self-pay

## 2013-03-18 ENCOUNTER — Encounter: Payer: Self-pay | Admitting: Internal Medicine

## 2013-03-18 ENCOUNTER — Ambulatory Visit (INDEPENDENT_AMBULATORY_CARE_PROVIDER_SITE_OTHER): Payer: Medicare Other | Admitting: *Deleted

## 2013-03-18 ENCOUNTER — Other Ambulatory Visit: Payer: Self-pay

## 2013-03-18 DIAGNOSIS — I5042 Chronic combined systolic (congestive) and diastolic (congestive) heart failure: Secondary | ICD-10-CM

## 2013-03-18 DIAGNOSIS — Z95810 Presence of automatic (implantable) cardiac defibrillator: Secondary | ICD-10-CM

## 2013-03-18 DIAGNOSIS — I4729 Other ventricular tachycardia: Secondary | ICD-10-CM

## 2013-03-18 DIAGNOSIS — I509 Heart failure, unspecified: Secondary | ICD-10-CM

## 2013-03-18 DIAGNOSIS — I472 Ventricular tachycardia: Secondary | ICD-10-CM

## 2013-03-19 LAB — REMOTE ICD DEVICE
CHARGE TIME: 8.718 s
DEV-0020ICD: NEGATIVE
HV IMPEDENCE: 61 Ohm
RV LEAD AMPLITUDE: 20 mv
RV LEAD IMPEDENCE ICD: 437 Ohm
RV LEAD THRESHOLD: 1.25 V
TOT-0001: 1
TOT-0002: 0
TOT-0006: 20120827000000
TZAT-0001FASTVT: 1
TZAT-0004SLOWVT: 8
TZAT-0004SLOWVT: 8
TZAT-0005SLOWVT: 91 pct
TZAT-0012SLOWVT: 200 ms
TZAT-0012SLOWVT: 200 ms
TZAT-0013SLOWVT: 2
TZAT-0013SLOWVT: 2
TZAT-0018FASTVT: NEGATIVE
TZAT-0020SLOWVT: 1.5 ms
TZON-0003SLOWVT: 360 ms
TZON-0004SLOWVT: 32
TZON-0005SLOWVT: 12
TZST-0001FASTVT: 2
TZST-0001FASTVT: 3
TZST-0001FASTVT: 4
TZST-0001FASTVT: 6
TZST-0001SLOWVT: 6
TZST-0002FASTVT: NEGATIVE
TZST-0002FASTVT: NEGATIVE
TZST-0003SLOWVT: 35 J
TZST-0003SLOWVT: 35 J

## 2013-03-21 ENCOUNTER — Other Ambulatory Visit: Payer: Self-pay

## 2013-03-21 MED ORDER — RANITIDINE HCL 300 MG PO TABS: 300.0000 mg | ORAL_TABLET | Freq: Every day | ORAL | Status: DC

## 2013-04-11 ENCOUNTER — Encounter: Payer: Self-pay | Admitting: *Deleted

## 2013-04-25 ENCOUNTER — Other Ambulatory Visit: Payer: Self-pay

## 2013-04-25 MED ORDER — RANITIDINE HCL 300 MG PO TABS: 300.0000 mg | ORAL_TABLET | Freq: Every day | ORAL | Status: DC

## 2013-04-25 NOTE — Telephone Encounter (Signed)
Patient Instructions  Continue your current medication  Follow up with Dr. Kirke Corin on Wednesday  I will see you in 4 months    Chart Reviewed By  Charna Elizabeth, LPN  on 08/17/453 10:16 AM     Previous Visit  Provider Department Encounter #  12/28/2012  5:37 PM Peter [place], MD [facility] [id number]

## 2013-05-23 ENCOUNTER — Other Ambulatory Visit: Payer: Self-pay

## 2013-05-23 MED ORDER — POTASSIUM CHLORIDE CRYS ER 20 MEQ PO TBCR: 20.0000 meq | EXTENDED_RELEASE_TABLET | Freq: Every day | ORAL | Status: DC

## 2013-06-21 ENCOUNTER — Ambulatory Visit (INDEPENDENT_AMBULATORY_CARE_PROVIDER_SITE_OTHER): Payer: Medicare Other | Admitting: Cardiology

## 2013-06-21 ENCOUNTER — Encounter: Payer: Self-pay | Admitting: Cardiology

## 2013-06-21 VITALS — BP 132/68 | HR 73 | Ht 68.0 in | Wt 257.8 lb

## 2013-06-21 DIAGNOSIS — I509 Heart failure, unspecified: Secondary | ICD-10-CM

## 2013-06-21 DIAGNOSIS — I472 Ventricular tachycardia, unspecified: Secondary | ICD-10-CM

## 2013-06-21 DIAGNOSIS — R55 Syncope and collapse: Secondary | ICD-10-CM

## 2013-06-21 DIAGNOSIS — I5042 Chronic combined systolic (congestive) and diastolic (congestive) heart failure: Secondary | ICD-10-CM

## 2013-06-21 DIAGNOSIS — I739 Peripheral vascular disease, unspecified: Secondary | ICD-10-CM

## 2013-06-21 DIAGNOSIS — I2510 Atherosclerotic heart disease of native coronary artery without angina pectoris: Secondary | ICD-10-CM

## 2013-06-21 NOTE — Patient Instructions (Signed)
We will schedule you for carotid dopplers.  We will check your defibrillator next week.  Continue your current therapy  I will see you in 6 months.

## 2013-06-21 NOTE — Progress Notes (Signed)
[name] Date of Birth: 06-25-1946 Medical Record #[id number]  History of Present Illness: Mr. Eichorn is seen today for a followup visit. He underwent complete cardiac evaluation in August 2013. This included an abnormal Myoview study with an ejection fraction of 32%. His cath showed severe 3VD, patent SVG to PD, patent SVG to PL, patent LIMA to LAD with a moderate severe stenosis at the anastomosis site and moderate LV dysfunction. He had normal right heart pressures. EF is 40 to 45%. He had previous attempt at intervention of the LIMA/LAD anastomosis with only partial success. The anterior apex is scarred. Medical therapy was recommended to be continued. The area of ischemia noted on the stress test correlates with the LCX occlusion. This is not amenable to revascularization.   Earlier this year he developed increased claudication symptoms. He underwent angiography and had a stent placed in the right popliteal artery. Since then his claudication symptoms have resolved. He denies any significant palpitations. Last week he did have an episode of transient dizziness that occurred while driving. He states he felt like he was going to pass out. He did stop taking Victoza of because it upset his stomach.  Current Outpatient Prescriptions on File Prior to Visit  Medication Sig Dispense Refill  . allopurinol (ZYLOPRIM) 300 MG tablet Take 300 mg by mouth daily.        [name] aspirin EC 81 MG tablet Take 1 tablet (81 mg total) by mouth daily.      [name] atorvastatin (LIPITOR) 40 MG tablet Take 1 tablet (40 mg total) by mouth daily.  30 tablet  5  . clopidogrel (PLAVIX) 75 MG tablet Take 1 tablet (75 mg total) by mouth daily.  30 tablet  0  . colchicine 0.6 MG tablet Take 0.6 mg by mouth 4 (four) times daily as needed. For gout flares      . fenofibrate 160 MG tablet Take 160 mg by mouth daily.        [name] glipiZIDE (GLUCOTROL) 10 MG tablet Take 10 mg by mouth daily.       . insulin detemir (LEVEMIR) 100  UNIT/ML injection Inject 75 Units into the skin 2 (two) times daily.       . Liraglutide (VICTOZA [place]) Inject 18 Units into the skin daily.       . metoprolol succinate (TOPROL-XL) 100 MG 24 hr tablet Take 1 tablet (100 mg total) by mouth daily.  90 tablet  3  . potassium chloride SA (KLOR-CON M20) 20 MEQ tablet Take 1 tablet (20 mEq total) by mouth daily.  30 tablet  2  . ranitidine (ZANTAC) 300 MG tablet Take 1 tablet (300 mg total) by mouth daily. As needed  30 tablet  1  . valsartan (DIOVAN) 160 MG tablet Take 1 tablet (160 mg total) by mouth daily.  30 tablet  5   No current facility-administered medications on file prior to visit.    Allergies  Allergen Reactions  . Morphine And Related Nausea And Vomiting    Past Medical History  Diagnosis Date  . Hyperlipidemia   . Hypertension   . Coronary heart disease   . Diabetes mellitus     on insulin  . OSA (obstructive sleep apnea)   . Obesity   . CHF (congestive heart failure)   . VT (ventricular tachycardia)   . SVT (supraventricular tachycardia)   . PAD (peripheral artery disease)     Angiography in February of 2014: Moderate left iliac artery stenosis,  mild to moderate diffuse right SFA disease. Severe proximal right popliteal artery stenosis with three-vessel runoff below the knee. Status post self-expanding stent placement to the proximal popliteal artery    Past Surgical History  Procedure Laterality Date  . Coronary artery bypass graft  1610,9604  . Tonsillectomy    . Right hand surgery      History  Smoking status  . Former Smoker -- 2.00 packs/day for 30 years  . Types: Cigarettes  . Quit date: 12/13/1995  Smokeless tobacco  . Never Used    History  Alcohol Use No    Family History  Problem Relation Age of Onset  . Heart disease Brother   . Cancer Brother   . Breast cancer Mother     Review of Systems: The review of systems is per the HPI.  All other systems were reviewed and are  negative.  Physical Exam: BP 132/68  Pulse 73  Ht 5\' 8"  (1.727 m)  Wt 257 lb 12.8 oz (116.937 kg)  BMI 39.21 kg/m2  SpO2 98% Patient is very pleasant and in no acute distress. He is obese. Skin is warm and dry. Color is normal.  HEENT is unremarkable. Normocephalic/atraumatic. PERRL. Sclera are nonicteric. Neck is supple. No masses. No JVD. Lungs are clear. Cardiac exam shows a regular rate and rhythm. Abdomen is obese but soft. Extremities are without edema. Gait and ROM are intact. No gross neurologic deficits noted.   LABORATORY DATA:  Lab Results  Component Value Date   WBC 8.9 01/16/2013   HGB 14.2 01/16/2013   HCT 42.4 01/16/2013   PLT 138.0* 01/16/2013   GLUCOSE 160* 01/16/2013   NA 136 01/16/2013   K 4.5 01/16/2013   CL 102 01/16/2013   CREATININE 1.3 01/16/2013   BUN 17 01/16/2013   CO2 28 01/16/2013   INR 1.1* 01/16/2013     Assessment / Plan:  1. Coronary disease status post CABG - with findings in August 2013 as noted above. He is to continue with his medical regimen. He is doing well. Tolerating his medicines.   2. HTN - BP is ok. No change in his current regimen.  3. Congestive heart failure with ischemic cardiomyopathy. He appears to be well compensated.  4. PAD with significant right calf claudication. Markedly improved following stenting of the right popliteal artery.  5. History of ventricular tachycardia status post ICD implant. Patient is scheduled for followup of his ICD next week.  6. Diabetes mellitus on insulin.  7. Near syncope. Symptoms were very transient. We will check his ICD as noted. I'll also schedule him for carotid Doppler studies.

## 2013-06-24 ENCOUNTER — Encounter: Payer: Self-pay | Admitting: Internal Medicine

## 2013-06-24 ENCOUNTER — Ambulatory Visit (INDEPENDENT_AMBULATORY_CARE_PROVIDER_SITE_OTHER): Payer: Medicare Other | Admitting: *Deleted

## 2013-06-24 DIAGNOSIS — I509 Heart failure, unspecified: Secondary | ICD-10-CM

## 2013-06-24 DIAGNOSIS — I5042 Chronic combined systolic (congestive) and diastolic (congestive) heart failure: Secondary | ICD-10-CM

## 2013-06-24 DIAGNOSIS — I472 Ventricular tachycardia, unspecified: Secondary | ICD-10-CM

## 2013-06-24 DIAGNOSIS — Z95810 Presence of automatic (implantable) cardiac defibrillator: Secondary | ICD-10-CM

## 2013-06-24 DIAGNOSIS — I4729 Other ventricular tachycardia: Secondary | ICD-10-CM

## 2013-06-26 ENCOUNTER — Encounter (INDEPENDENT_AMBULATORY_CARE_PROVIDER_SITE_OTHER): Payer: Medicare Other

## 2013-06-26 DIAGNOSIS — I5042 Chronic combined systolic (congestive) and diastolic (congestive) heart failure: Secondary | ICD-10-CM

## 2013-06-26 DIAGNOSIS — I472 Ventricular tachycardia: Secondary | ICD-10-CM

## 2013-06-26 DIAGNOSIS — R55 Syncope and collapse: Secondary | ICD-10-CM

## 2013-06-26 DIAGNOSIS — I6529 Occlusion and stenosis of unspecified carotid artery: Secondary | ICD-10-CM

## 2013-06-26 DIAGNOSIS — I739 Peripheral vascular disease, unspecified: Secondary | ICD-10-CM

## 2013-06-26 DIAGNOSIS — I2510 Atherosclerotic heart disease of native coronary artery without angina pectoris: Secondary | ICD-10-CM

## 2013-07-01 LAB — REMOTE ICD DEVICE
BATTERY VOLTAGE: 3.1287 V
BRDY-0002RV: 40 {beats}/min
PACEART VT: 0
TOT-0002: 0
TOT-0006: 20120827000000
TZAT-0001SLOWVT: 2
TZAT-0004SLOWVT: 8
TZAT-0005SLOWVT: 84 pct
TZAT-0005SLOWVT: 91 pct
TZAT-0011SLOWVT: 10 ms
TZAT-0011SLOWVT: 10 ms
TZAT-0018FASTVT: NEGATIVE
TZAT-0019FASTVT: 8 V
TZAT-0020FASTVT: 1.5 ms
TZON-0004VSLOWVT: 36
TZON-0005SLOWVT: 12
TZST-0001FASTVT: 3
TZST-0001FASTVT: 5
TZST-0001FASTVT: 6
TZST-0001SLOWVT: 3
TZST-0001SLOWVT: 4
TZST-0001SLOWVT: 6
TZST-0002FASTVT: NEGATIVE
TZST-0002FASTVT: NEGATIVE
TZST-0003SLOWVT: 35 J
TZST-0003SLOWVT: 35 J
VENTRICULAR PACING ICD: 0.01 pct

## 2013-07-02 ENCOUNTER — Other Ambulatory Visit: Payer: Self-pay | Admitting: *Deleted

## 2013-07-02 MED ORDER — VALSARTAN 160 MG PO TABS: 160.0000 mg | ORAL_TABLET | Freq: Every day | ORAL | Status: DC

## 2013-07-02 MED ORDER — RANITIDINE HCL 300 MG PO TABS: 300.0000 mg | ORAL_TABLET | Freq: Every day | ORAL | Status: DC

## 2013-07-02 MED ORDER — ATORVASTATIN CALCIUM 40 MG PO TABS: 40.0000 mg | ORAL_TABLET | Freq: Every day | ORAL | Status: DC

## 2013-07-09 ENCOUNTER — Encounter: Payer: Self-pay | Admitting: *Deleted

## 2013-07-29 ENCOUNTER — Encounter: Payer: Self-pay | Admitting: Nurse Practitioner

## 2013-07-29 ENCOUNTER — Other Ambulatory Visit: Payer: Self-pay | Admitting: Nurse Practitioner

## 2013-07-29 ENCOUNTER — Inpatient Hospital Stay (HOSPITAL_COMMUNITY)
Admission: AD | Admit: 2013-07-29 | Discharge: 2013-07-30 | DRG: 313 | Disposition: A | Discharge status: Home / Self Care | Payer: Medicare Other | Source: Ambulatory Visit | Attending: Cardiology | Admitting: Cardiology

## 2013-07-29 ENCOUNTER — Encounter (HOSPITAL_COMMUNITY): Payer: Self-pay | Admitting: General Practice

## 2013-07-29 ENCOUNTER — Telehealth: Payer: Self-pay | Admitting: Cardiology

## 2013-07-29 ENCOUNTER — Ambulatory Visit (INDEPENDENT_AMBULATORY_CARE_PROVIDER_SITE_OTHER): Payer: Medicare Other | Admitting: Nurse Practitioner

## 2013-07-29 ENCOUNTER — Ambulatory Visit: Payer: Medicare Other | Admitting: Nurse Practitioner

## 2013-07-29 VITALS — BP 128/72 | HR 82 | Ht 72.0 in | Wt 258.0 lb

## 2013-07-29 DIAGNOSIS — R079 Chest pain, unspecified: Secondary | ICD-10-CM

## 2013-07-29 DIAGNOSIS — I25810 Atherosclerosis of coronary artery bypass graft(s) without angina pectoris: Secondary | ICD-10-CM

## 2013-07-29 DIAGNOSIS — Z87891 Personal history of nicotine dependence: Secondary | ICD-10-CM

## 2013-07-29 DIAGNOSIS — M109 Gout, unspecified: Secondary | ICD-10-CM | POA: Diagnosis present

## 2013-07-29 DIAGNOSIS — E785 Hyperlipidemia, unspecified: Secondary | ICD-10-CM | POA: Diagnosis present

## 2013-07-29 DIAGNOSIS — E669 Obesity, unspecified: Secondary | ICD-10-CM | POA: Diagnosis present

## 2013-07-29 DIAGNOSIS — Z79899 Other long term (current) drug therapy: Secondary | ICD-10-CM

## 2013-07-29 DIAGNOSIS — I2510 Atherosclerotic heart disease of native coronary artery without angina pectoris: Secondary | ICD-10-CM | POA: Diagnosis present

## 2013-07-29 DIAGNOSIS — I5042 Chronic combined systolic (congestive) and diastolic (congestive) heart failure: Secondary | ICD-10-CM | POA: Diagnosis present

## 2013-07-29 DIAGNOSIS — Z7902 Long term (current) use of antithrombotics/antiplatelets: Secondary | ICD-10-CM

## 2013-07-29 DIAGNOSIS — I10 Essential (primary) hypertension: Secondary | ICD-10-CM | POA: Diagnosis present

## 2013-07-29 DIAGNOSIS — E119 Type 2 diabetes mellitus without complications: Secondary | ICD-10-CM | POA: Diagnosis present

## 2013-07-29 DIAGNOSIS — I509 Heart failure, unspecified: Secondary | ICD-10-CM | POA: Diagnosis present

## 2013-07-29 DIAGNOSIS — Z794 Long term (current) use of insulin: Secondary | ICD-10-CM

## 2013-07-29 DIAGNOSIS — G4733 Obstructive sleep apnea (adult) (pediatric): Secondary | ICD-10-CM | POA: Diagnosis present

## 2013-07-29 DIAGNOSIS — I498 Other specified cardiac arrhythmias: Secondary | ICD-10-CM | POA: Diagnosis present

## 2013-07-29 DIAGNOSIS — Z951 Presence of aortocoronary bypass graft: Secondary | ICD-10-CM

## 2013-07-29 DIAGNOSIS — Z8249 Family history of ischemic heart disease and other diseases of the circulatory system: Secondary | ICD-10-CM

## 2013-07-29 DIAGNOSIS — I2589 Other forms of chronic ischemic heart disease: Secondary | ICD-10-CM | POA: Diagnosis present

## 2013-07-29 DIAGNOSIS — I739 Peripheral vascular disease, unspecified: Secondary | ICD-10-CM | POA: Diagnosis present

## 2013-07-29 DIAGNOSIS — I472 Ventricular tachycardia, unspecified: Secondary | ICD-10-CM | POA: Diagnosis present

## 2013-07-29 DIAGNOSIS — Z95810 Presence of automatic (implantable) cardiac defibrillator: Secondary | ICD-10-CM

## 2013-07-29 DIAGNOSIS — Z803 Family history of malignant neoplasm of breast: Secondary | ICD-10-CM

## 2013-07-29 DIAGNOSIS — I4729 Other ventricular tachycardia: Secondary | ICD-10-CM | POA: Diagnosis present

## 2013-07-29 HISTORY — DX: Gout, unspecified: M10.9

## 2013-07-29 HISTORY — DX: Obstructive sleep apnea (adult) (pediatric): G47.33

## 2013-07-29 HISTORY — DX: Gastro-esophageal reflux disease without esophagitis: K21.9

## 2013-07-29 HISTORY — DX: Unspecified asthma, uncomplicated: J45.909

## 2013-07-29 HISTORY — DX: Personal history of pneumonia (recurrent): Z87.01

## 2013-07-29 HISTORY — DX: Presence of automatic (implantable) cardiac defibrillator: Z95.810

## 2013-07-29 HISTORY — DX: Anxiety disorder, unspecified: F41.9

## 2013-07-29 HISTORY — DX: Dependence on other enabling machines and devices: Z99.89

## 2013-07-29 HISTORY — DX: Type 2 diabetes mellitus without complications: E11.9

## 2013-07-29 HISTORY — DX: Personal history of other diseases of the digestive system: Z87.19

## 2013-07-29 LAB — COMPREHENSIVE METABOLIC PANEL
ALT: 13 U/L (ref 0–53)
AST: 30 U/L (ref 0–37)
Albumin: 3.8 g/dL (ref 3.5–5.2)
Alkaline Phosphatase: 72 U/L (ref 39–117)
BUN: 16 mg/dL (ref 6–23)
CO2: 22 mEq/L (ref 19–32)
Calcium: 9.2 mg/dL (ref 8.4–10.5)
Chloride: 104 mEq/L (ref 96–112)
Creatinine, Ser: 1.1 mg/dL (ref 0.50–1.35)
GFR calc Af Amer: 80 mL/min — ABNORMAL LOW (ref >90)
GFR calc non Af Amer: 69 mL/min — ABNORMAL LOW (ref >90)
Glucose, Bld: 109 mg/dL — ABNORMAL HIGH (ref 70–99)
Potassium: 3.9 mEq/L (ref 3.5–5.1)
Sodium: 139 mEq/L (ref 135–145)
Total Bilirubin: 0.4 mg/dL (ref 0.3–1.2)
Total Protein: 6.8 g/dL (ref 6.0–8.3)

## 2013-07-29 LAB — CBC WITH DIFFERENTIAL/PLATELET
Basophils Absolute: 0 10*3/uL (ref 0.0–0.1)
Basophils Relative: 1 % (ref 0–1)
Eosinophils Absolute: 0.5 10*3/uL (ref 0.0–0.7)
Eosinophils Relative: 6 % — ABNORMAL HIGH (ref 0–5)
HCT: 40.7 % (ref 39.0–52.0)
Hemoglobin: 14.5 g/dL (ref 13.0–17.0)
Lymphocytes Relative: 36 % (ref 12–46)
Lymphs Abs: 3 10*3/uL (ref 0.7–4.0)
MCH: 30.7 pg (ref 26.0–34.0)
MCHC: 35.6 g/dL (ref 30.0–36.0)
MCV: 86.2 fL (ref 78.0–100.0)
Monocytes Absolute: 0.9 10*3/uL (ref 0.1–1.0)
Monocytes Relative: 11 % (ref 3–12)
Neutro Abs: 4.1 10*3/uL (ref 1.7–7.7)
Neutrophils Relative %: 48 % (ref 43–77)
Platelets: 178 10*3/uL (ref 150–400)
RBC: 4.72 MIL/uL (ref 4.22–5.81)
RDW: 13.8 % (ref 11.5–15.5)
WBC: 8.5 10*3/uL (ref 4.0–10.5)

## 2013-07-29 LAB — GLUCOSE, CAPILLARY: Glucose-Capillary: 126 mg/dL — ABNORMAL HIGH (ref 70–99)

## 2013-07-29 LAB — MAGNESIUM: Magnesium: 2.2 mg/dL (ref 1.5–2.5)

## 2013-07-29 LAB — PROTIME-INR
INR: 1 (ref 0.00–1.49)
Prothrombin Time: 13 seconds (ref 11.6–15.2)

## 2013-07-29 LAB — TROPONIN I
Troponin I: <0.3 ng/mL (ref <0.30)
Troponin I: <0.3 ng/mL (ref <0.30)

## 2013-07-29 LAB — APTT: aPTT: 60 seconds — ABNORMAL HIGH (ref 24–37)

## 2013-07-29 LAB — PRO B NATRIURETIC PEPTIDE: Pro B Natriuretic peptide (BNP): 250.7 pg/mL — ABNORMAL HIGH (ref 0–125)

## 2013-07-29 MED ORDER — SODIUM CHLORIDE 0.9 % IJ SOLN: 3.0000 mL | INTRAMUSCULAR | Status: DC | PRN

## 2013-07-29 MED ORDER — ONDANSETRON HCL 4 MG/2ML IJ SOLN: 4.0000 mg | Freq: Four times a day (QID) | INTRAMUSCULAR | Status: DC | PRN

## 2013-07-29 MED ORDER — SODIUM CHLORIDE 0.9 % IJ SOLN: 3.0000 mL | Freq: Two times a day (BID) | INTRAMUSCULAR | Status: DC

## 2013-07-29 MED ORDER — ACETAMINOPHEN 325 MG PO TABS: 650.0000 mg | ORAL_TABLET | ORAL | Status: DC | PRN

## 2013-07-29 MED ORDER — LIRAGLUTIDE 18 MG/3ML ~~LOC~~ SOPN: 18.0000 [IU] | PEN_INJECTOR | Freq: Every day | SUBCUTANEOUS | Status: DC

## 2013-07-29 MED ORDER — POTASSIUM CHLORIDE CRYS ER 20 MEQ PO TBCR
20.0000 meq | EXTENDED_RELEASE_TABLET | Freq: Every day | ORAL | Status: DC
  Administered 2013-07-30: 20 meq via ORAL
  Filled 2013-07-29: qty 1

## 2013-07-29 MED ORDER — ALLOPURINOL 300 MG PO TABS
300.0000 mg | ORAL_TABLET | Freq: Every day | ORAL | Status: DC
  Administered 2013-07-30: 300 mg via ORAL
  Filled 2013-07-29 (×2): qty 1

## 2013-07-29 MED ORDER — DIAZEPAM 5 MG PO TABS: 10.0000 mg | ORAL_TABLET | ORAL | Status: DC

## 2013-07-29 MED ORDER — HEPARIN BOLUS VIA INFUSION
4000.0000 [IU] | Freq: Once | INTRAVENOUS | Status: AC
  Administered 2013-07-29: 4000 [IU] via INTRAVENOUS
  Filled 2013-07-29: qty 4000

## 2013-07-29 MED ORDER — ASPIRIN 81 MG PO CHEW
324.0000 mg | CHEWABLE_TABLET | ORAL | Status: AC
  Administered 2013-07-30: 324 mg via ORAL
  Filled 2013-07-29: qty 4

## 2013-07-29 MED ORDER — GLIPIZIDE ER 10 MG PO TB24
10.0000 mg | ORAL_TABLET | Freq: Every day | ORAL | Status: DC
  Administered 2013-07-30: 10 mg via ORAL
  Filled 2013-07-29 (×2): qty 1

## 2013-07-29 MED ORDER — SODIUM CHLORIDE 0.9 % IV SOLN: 250.0000 mL | INTRAVENOUS | Status: DC | PRN

## 2013-07-29 MED ORDER — IRBESARTAN 300 MG PO TABS
300.0000 mg | ORAL_TABLET | Freq: Every day | ORAL | Status: DC
  Administered 2013-07-30: 300 mg via ORAL
  Filled 2013-07-29 (×2): qty 1

## 2013-07-29 MED ORDER — ASPIRIN 300 MG RE SUPP
300.0000 mg | RECTAL | Status: AC
  Filled 2013-07-29: qty 1

## 2013-07-29 MED ORDER — ATORVASTATIN CALCIUM 40 MG PO TABS
40.0000 mg | ORAL_TABLET | Freq: Every day | ORAL | Status: DC
  Filled 2013-07-29 (×2): qty 1

## 2013-07-29 MED ORDER — SODIUM CHLORIDE 0.9 % IV SOLN
INTRAVENOUS | Status: DC
  Administered 2013-07-30: 10 mL/h via INTRAVENOUS

## 2013-07-29 MED ORDER — ASPIRIN 81 MG PO CHEW: 324.0000 mg | CHEWABLE_TABLET | ORAL | Status: AC

## 2013-07-29 MED ORDER — FAMOTIDINE 20 MG PO TABS
20.0000 mg | ORAL_TABLET | Freq: Every day | ORAL | Status: DC
  Administered 2013-07-30: 20 mg via ORAL
  Filled 2013-07-29 (×2): qty 1

## 2013-07-29 MED ORDER — FENOFIBRATE 160 MG PO TABS
160.0000 mg | ORAL_TABLET | Freq: Every day | ORAL | Status: DC
  Administered 2013-07-30: 160 mg via ORAL
  Filled 2013-07-29 (×2): qty 1

## 2013-07-29 MED ORDER — NITROGLYCERIN IN D5W 200-5 MCG/ML-% IV SOLN
3.0000 ug/min | INTRAVENOUS | Status: DC
  Administered 2013-07-29: 5 ug/min via INTRAVENOUS
  Filled 2013-07-29: qty 250

## 2013-07-29 MED ORDER — NITROGLYCERIN 0.4 MG SL SUBL: 0.4000 mg | SUBLINGUAL_TABLET | SUBLINGUAL | Status: DC | PRN

## 2013-07-29 MED ORDER — METOPROLOL SUCCINATE ER 100 MG PO TB24
100.0000 mg | ORAL_TABLET | Freq: Every day | ORAL | Status: DC
  Administered 2013-07-30: 100 mg via ORAL
  Filled 2013-07-29 (×2): qty 1

## 2013-07-29 MED ORDER — HEPARIN (PORCINE) IN NACL 100-0.45 UNIT/ML-% IJ SOLN
1750.0000 [IU]/h | INTRAMUSCULAR | Status: DC
  Administered 2013-07-29: 1400 [IU]/h via INTRAVENOUS
  Administered 2013-07-29 – 2013-07-30 (×2): 1750 [IU]/h via INTRAVENOUS
  Filled 2013-07-29 (×4): qty 250

## 2013-07-29 MED ORDER — ASPIRIN EC 81 MG PO TBEC
81.0000 mg | DELAYED_RELEASE_TABLET | Freq: Every day | ORAL | Status: DC
  Administered 2013-07-30: 81 mg via ORAL
  Filled 2013-07-29: qty 1

## 2013-07-29 MED ORDER — HEPARIN BOLUS VIA INFUSION
2500.0000 [IU] | Freq: Once | INTRAVENOUS | Status: AC
  Administered 2013-07-29: 2500 [IU] via INTRAVENOUS
  Filled 2013-07-29: qty 2500

## 2013-07-29 NOTE — Addendum Note (Signed)
Addended by: Regis Bill B on: 07/29/2013 03:12 PM   Modules accepted: Orders

## 2013-07-29 NOTE — Patient Instructions (Signed)
We are going to admit you to the hospital.  

## 2013-07-29 NOTE — Progress Notes (Signed)
ANTICOAGULATION CONSULT NOTE - Initial Consult  Pharmacy Consult for heparin Indication: chest pain/ACS  Allergies  Allergen Reactions  . Morphine And Related Nausea And Vomiting    Patient Measurements: Height: 6' (182.9 cm) Weight: 258 lb (117.028 kg) IBW/kg (Calculated) : 77.6 Heparin Dosing Weight: 100kg  Vital Signs: Temp: 98.5 F (36.9 C) (08/18 2046) Temp src: Oral (08/18 2046) BP: 127/64 mmHg (08/18 2046) Pulse Rate: 80 (08/18 2046)  Labs:  Recent Labs  07/29/13 1853 07/29/13 1935 07/29/13 2249  HGB 14.5  --   --   HCT 40.7  --   --   PLT 178  --   --   APTT 60*  --   --   LABPROT 13.0  --   --   INR 1.00  --   --   HEPARINUNFRC  --   --  0.17*  CREATININE 1.10  --   --   TROPONINI  --  <0.30  --     Estimated Creatinine Clearance: 89.6 ml/min (by C-G formula based on Cr of 1.1).   Medical History: Past Medical History  Diagnosis Date  . Hyperlipidemia   . Hypertension   . Coronary heart disease   . Obesity   . CHF (congestive heart failure)   . VT (ventricular tachycardia)   . SVT (supraventricular tachycardia)   . PAD (peripheral artery disease)     Angiography in February of 2014: Moderate left iliac artery stenosis, mild to moderate diffuse right SFA disease. Severe proximal right popliteal artery stenosis with three-vessel runoff below the knee. Status post self-expanding stent placement to the proximal popliteal artery  . Automatic implantable cardioverter-defibrillator in situ   . Asthma     "small touch" (07/29/2013)  . Pneumonia     "3-4 times; last time ~ 2003" (07/29/2013)  . Shortness of breath     "all the time" (07/29/2013)  . OSA on CPAP   . Type II diabetes mellitus     on insulin  . GERD (gastroesophageal reflux disease)   . H/O hiatal hernia   . Gout   . Anxiety    Assessment: 67 year old male seen in office as a work in visit for shoulder and chest pain. He has known CAD and is currently being managed medically given  difficult caths and procedures in the past. Patient now directly admitted to cardiac unit for further work up and possible cath tomorrow.   1st HL 0.17 no bleeding noted.    Goal of Therapy:  Heparin level 0.3-0.7 units/ml Monitor platelets by anticoagulation protocol: Yes   Plan:   heparin bolus 2500 unitsx1 then increase to 1750 units/hr.   Recheck HL with am labs.  Janice Coffin

## 2013-07-29 NOTE — Telephone Encounter (Signed)
Pt has had constant chest pain since last night, thought maybe he slept wrong, denies any other symptoms, pls call to advise, wonders if should be see today? Pt sounds fine

## 2013-07-29 NOTE — Telephone Encounter (Signed)
ekg done/ changes noted, to be seen for direct admit.

## 2013-07-29 NOTE — H&P (Signed)
Walter French  07/29/2013 10:00 AM   Clinical Support  MRN:  [id number]   Description: 67 year old male  Provider: Rosalio Macadamia, NP  Department: [facility]        Diagnoses    Chest pain    -  Primary    786.50    Coronary artery disease        414.00      Reason for Visit    Chest Pain    Work in visit. Seen for Dr. [place]. Presents to the office as a walkin with chest pain.         Current Vitals - Last Recorded    BP Pulse Ht Wt BMI      128/72 82 6' (1.829 m) 258 lb (117.028 kg) 34.98 kg/m2         Progress Notes    Rosalio Macadamia, NP at 07/29/2013  2:55 PM    Status: Signed                      Walter French Date of Birth: February 25, 1946 Medical Record #[id number]   History of Present Illness: Walter French is seen back today for a work in visit. Seen for in Dr. Elvis Coil absence and for Dr. Patty Sermons (DOD today). He has known CAD last cath a year ago with right and left heart cath due to increasing dyspnea and an abnormal Lexiscan. His cath showed severe 3VD, patent SVG to PD, patent SVG to PL, patent LIMA to LAD with a moderate severe stenosis at the anastomosis site and moderate LV dysfunction. He had normal right heart pressures. EF was 40 to 45%. Medical therapy was recommended to be continued at that time. The area of ischemia noted on the stress test correlated with the LCX occlusion. This is not amenable to revascularization. While there is a moderately severe stenosis at the anastomotic site of the IMA graft to the LAD, there was no ischemia noted in this terrority on the Myoview. The apex was scar. He has had prior attempted angioplasty of this lesion with moderate success but it was an extremely difficult procedure. It was Dr. Elvis Coil opinion to continue medical therapy.    His other issues include HLD, gout, HTN, VT with ICD in place, OSA, DM and obesity. He has had angiography and stent to the right popliteal artery earlier this year with  resolution of his claudication.    Seen back in July of 2014 - seemed to be doing ok.    Comes back today. Called here earlier this morning with left shoulder pain and asked to come to the office for an EKG. This would be a new sensation for him. Had been feeling ok. Sunday he woke up with this aching sensation in his left chest. Worse this morning. Feels more short of breath and more diaphoretic. No NTG used. Not worse with exertion and has no precipitating or relieving factors. Was asked to come here for EKG - this is abnormal with new inferior changes and change in axis. Reviewed by Dr. Patty Sermons. Now referred on for admission.       Current Outpatient Prescriptions   Medication  Sig  Dispense  Refill   .  allopurinol (ZYLOPRIM) 300 MG tablet  Take 300 mg by mouth daily.           [name]  aspirin EC 81 MG tablet  Take 1 tablet (81 mg total) by mouth daily.         [name]  atorvastatin (LIPITOR) 40 MG tablet  Take 1 tablet (40 mg total) by mouth daily.   30 tablet   5   .  clopidogrel (PLAVIX) 75 MG tablet  Take 1 tablet (75 mg total) by mouth daily.   30 tablet   0   .  colchicine 0.6 MG tablet  Take 0.6 mg by mouth 4 (four) times daily as needed. For gout flares         .  fenofibrate 160 MG tablet  Take 160 mg by mouth daily.           [name]  glipiZIDE (GLUCOTROL) 10 MG tablet  Take 10 mg by mouth daily.          [name]  HYDROcodone-acetaminophen (NORCO/VICODIN) 5-325 MG per tablet  Take 2 tablets by mouth every 8 (eight) hours as needed.          .  insulin detemir (LEVEMIR) 100 UNIT/ML injection  Inject 75 Units into the skin 2 (two) times daily.          .  Liraglutide (VICTOZA [place])  Inject 18 Units into the skin daily.          .  metoprolol succinate (TOPROL-XL) 100 MG 24 hr tablet  Take 1 tablet (100 mg total) by mouth daily.   90 tablet   3   .  potassium chloride SA (KLOR-CON M20) 20 MEQ tablet  Take 1 tablet (20 mEq total) by mouth daily.   30 tablet   2   .  ranitidine (ZANTAC) 300 MG tablet  Take  1 tablet (300 mg total) by mouth daily. As needed   30 tablet   2   .  valsartan (DIOVAN) 160 MG tablet  Take 1 tablet (160 mg total) by mouth daily.   30 tablet   5       No current facility-administered medications for this visit.         Allergies   Allergen  Reactions   .  Morphine And Related  Nausea And Vomiting         Past Medical History   Diagnosis  Date   .  Hyperlipidemia     .  Hypertension     .  Coronary heart disease     .  Diabetes mellitus         on insulin   .  OSA (obstructive sleep apnea)     .  Obesity     .  CHF (congestive heart failure)     .  VT (ventricular tachycardia)     .  SVT (supraventricular tachycardia)     .  PAD (peripheral artery disease)         Angiography in February of 2014: Moderate left iliac artery stenosis, mild to moderate diffuse right SFA disease. Severe proximal right popliteal artery stenosis with three-vessel runoff below the knee. Status post self-expanding stent placement to the proximal popliteal artery         Past Surgical History   Procedure  Laterality  Date   .  Coronary artery bypass graft    1308,6578   .  Tonsillectomy       .  Right hand surgery             History   Smoking status   .  Former Smoker -- 2.00 packs/day for 30 years   .  Types:  Cigarettes   .  Quit date:  12/13/1995  Smokeless tobacco   .  Never Used         History   Alcohol Use  No         Family History   Problem  Relation  Age of Onset   .  Heart disease  Brother     .  Cancer  Brother     .  Breast cancer  Mother          Review of Systems: The review of systems is per the HPI. No other issues noted. No fever, chills, N/V. Pain not worse with movement.  All other systems were reviewed and are negative.   Physical Exam: BP 128/72  Pulse 82  Ht 6' (1.829 m)  Wt 258 lb (117.028 kg)  BMI 34.98 kg/m2 Patient is very pleasant and in no acute distress. Skin is warm and dry. Color is normal.  HEENT is  unremarkable. Normocephalic/atraumatic. PERRL. Sclera are nonicteric. Neck is supple. No masses. No JVD. Lungs are clear. Cardiac exam shows a regular rate and rhythm. No palpable chest wall tenderness. ICD looks ok in the left upper chest. Abdomen is soft. Extremities are without edema. Gait and ROM are intact. No gross neurologic deficits noted.   LABORATORY DATA:     Lab Results   Component  Value  Date     WBC  8.9  01/16/2013     HGB  14.2  01/16/2013     HCT  42.4  01/16/2013     PLT  138.0*  01/16/2013     GLUCOSE  160*  01/16/2013     NA  136  01/16/2013     K  4.5  01/16/2013     CL  102  01/16/2013     CREATININE  1.3  01/16/2013     BUN  17  01/16/2013     CO2  28  01/16/2013     INR  1.1*  01/16/2013        Assessment / Plan: 1. Chest pain - with known CAD - last cath a year ago - trying to manage medically - now with recurrent symptoms and abnormal EKG - Dr. Patty Sermons has recommended admission to the hospital. May need to be recathed - will defer to Dr. [place].    2. HTN - blood pressure looks ok.    3. Ischemic CM    4. PAD   5. VT with underlying ICD   6. DM   Will proceed on with admission to the hospital. Possible recath in the am. Check labs. Given 4 baby aspirin here in the office.    Patient is agreeable to this plan and will call if any problems develop in the interim.    Rosalio Macadamia, RN, ANP-C  [facility] [address] [place], [place]  [address]             Encounter-Level Documents:    Electronic signature on 07/29/2013 2:13 PM        Not recorded         Orders Placed This Encounter    EKG 12-Lead [EKG1 Custom]         Patient Instructions    We are going to admit you to the hospital.            Level of Service    PR OFFICE OUTPATIENT VISIT 25 MINUTES [60454]       LOS History Recorded      Follow-up and  Disposition    Routing History Recorded        All Flowsheet Templates (all recorded)     Encounter Vitals Flowsheet    Custom Formula Data Flowsheet    Anthropometrics Flowsheet           Referring Provider    Walter Provost, MD       All Charges for This Encounter    Code Description Service Date Service Provider Modifiers Qty    [phone] PR OFFICE OUTPATIENT VISIT 25 MINUTES 07/29/2013 Rosalio Macadamia, NP   1        Other Encounter Related Information    Allergies & Medications         Problem List         History         Patient-Entered Questionnaires   AVS Reports    No AVS Snapshots are available for this encounter.      Diabetic Foot Exam    No data filed      Diabetic Foot Form - Detailed    No data filed      Diabetic Foot Exam - Simple    No data filed

## 2013-07-29 NOTE — Progress Notes (Signed)
Walter French Date of Birth: February 13, 1946 Medical Record #[id number]  History of Present Illness: Walter French is seen back today for a work in visit. Seen for in Walter French absence and for Walter French (DOD today). He has known CAD last cath a year ago with right and left heart cath due to increasing dyspnea and an abnormal Lexiscan. His cath showed severe 3VD, patent SVG to PD, patent SVG to PL, patent LIMA to LAD with a moderate severe stenosis at the anastomosis site and moderate LV dysfunction. He had normal right heart pressures. EF was 40 to 45%. Medical therapy was recommended to be continued at that time. The area of ischemia noted on the stress test correlated with the LCX occlusion. This is not amenable to revascularization. While there is a moderately severe stenosis at the anastomotic site of the IMA graft to the LAD, there was no ischemia noted in this terrority on the Myoview. The apex was scar. He has had prior attempted angioplasty of this lesion with moderate success but it was an extremely difficult procedure. It was Walter French opinion to continue medical therapy.   His other issues include HLD, gout, HTN, VT with ICD in place, OSA, DM and obesity. He has had angiography and stent to the right popliteal artery earlier this year with resolution of his claudication.   Seen back in July of 2014 - seemed to be doing ok.   Comes back today. Called here earlier this morning with left shoulder pain and asked to come to the office for an EKG. This would be a new sensation for him. Had been feeling ok. Sunday he woke up with this aching sensation in his left chest. Worse this morning. Feels more short of breath and more diaphoretic. No NTG used. Not worse with exertion and has no precipitating or relieving factors. Was asked to come here for EKG - this is abnormal with new inferior changes and change in axis. Reviewed by Walter French. Now referred on for admission.    Current Outpatient  Prescriptions  Medication Sig Dispense Refill  . allopurinol (ZYLOPRIM) 300 MG tablet Take 300 mg by mouth daily.        [name] aspirin EC 81 MG tablet Take 1 tablet (81 mg total) by mouth daily.      [name] atorvastatin (LIPITOR) 40 MG tablet Take 1 tablet (40 mg total) by mouth daily.  30 tablet  5  . clopidogrel (PLAVIX) 75 MG tablet Take 1 tablet (75 mg total) by mouth daily.  30 tablet  0  . colchicine 0.6 MG tablet Take 0.6 mg by mouth 4 (four) times daily as needed. For gout flares      . fenofibrate 160 MG tablet Take 160 mg by mouth daily.        [name] glipiZIDE (GLUCOTROL) 10 MG tablet Take 10 mg by mouth daily.       [name] HYDROcodone-acetaminophen (NORCO/VICODIN) 5-325 MG per tablet Take 2 tablets by mouth every 8 (eight) hours as needed.       . insulin detemir (LEVEMIR) 100 UNIT/ML injection Inject 75 Units into the skin 2 (two) times daily.       . Liraglutide (VICTOZA [place]) Inject 18 Units into the skin daily.       . metoprolol succinate (TOPROL-XL) 100 MG 24 hr tablet Take 1 tablet (100 mg total) by mouth daily.  90 tablet  3  . potassium chloride SA (KLOR-CON M20) 20 MEQ tablet Take 1 tablet (  20 mEq total) by mouth daily.  30 tablet  2  . ranitidine (ZANTAC) 300 MG tablet Take 1 tablet (300 mg total) by mouth daily. As needed  30 tablet  2  . valsartan (DIOVAN) 160 MG tablet Take 1 tablet (160 mg total) by mouth daily.  30 tablet  5   No current facility-administered medications for this visit.    Allergies  Allergen Reactions  . Morphine And Related Nausea And Vomiting    Past Medical History  Diagnosis Date  . Hyperlipidemia   . Hypertension   . Coronary heart disease   . Diabetes mellitus     on insulin  . OSA (obstructive sleep apnea)   . Obesity   . CHF (congestive heart failure)   . VT (ventricular tachycardia)   . SVT (supraventricular tachycardia)   . PAD (peripheral artery disease)     Angiography in February of 2014: Moderate left iliac artery stenosis, mild to moderate  diffuse right SFA disease. Severe proximal right popliteal artery stenosis with three-vessel runoff below the knee. Status post self-expanding stent placement to the proximal popliteal artery    Past Surgical History  Procedure Laterality Date  . Coronary artery bypass graft  1610,9604  . Tonsillectomy    . Right hand surgery      History  Smoking status  . Former Smoker -- 2.00 packs/day for 30 years  . Types: Cigarettes  . Quit date: 12/13/1995  Smokeless tobacco  . Never Used    History  Alcohol Use No    Family History  Problem Relation Age of Onset  . Heart disease Brother   . Cancer Brother   . Breast cancer Mother     Review of Systems: The review of systems is per the HPI. No other issues noted. No fever, chills, N/V. Pain not worse with movement.  All other systems were reviewed and are negative.  Physical Exam: BP 128/72  Pulse 82  Ht 6' (1.829 m)  Wt 258 lb (117.028 kg)  BMI 34.98 kg/m2 Patient is very pleasant and in no acute distress. Skin is warm and dry. Color is normal.  HEENT is unremarkable. Normocephalic/atraumatic. PERRL. Sclera are nonicteric. Neck is supple. No masses. No JVD. Lungs are clear. Cardiac exam shows a regular rate and rhythm. No palpable chest wall tenderness. ICD looks ok in the left upper chest. Abdomen is soft. Extremities are without edema. Gait and ROM are intact. No gross neurologic deficits noted.  LABORATORY DATA:   Lab Results  Component Value Date   WBC 8.9 01/16/2013   HGB 14.2 01/16/2013   HCT 42.4 01/16/2013   PLT 138.0* 01/16/2013   GLUCOSE 160* 01/16/2013   NA 136 01/16/2013   K 4.5 01/16/2013   CL 102 01/16/2013   CREATININE 1.3 01/16/2013   BUN 17 01/16/2013   CO2 28 01/16/2013   INR 1.1* 01/16/2013    Assessment / Plan: 1. Chest pain - with known CAD - last cath a year ago - trying to manage medically - now with recurrent symptoms and abnormal EKG - Walter French has recommended admission to the hospital. May need to be recathed  - will defer to Dr. [place].   2. HTN - blood pressure looks ok.   3. Ischemic CM   4. PAD  5. VT with underlying ICD  6. DM  Will proceed on with admission to the hospital. Possible recath in the am. Check labs. Given 4 baby aspirin here in the office.  Patient is agreeable to this plan and will call if any problems develop in the interim.   Rosalio Macadamia, RN, ANP-C [facility] [address] [place], [place]  [address]

## 2013-07-29 NOTE — Telephone Encounter (Signed)
Addendum, Pt is unsure if he slept wrong or it is cp. He states he feels good otherwise.

## 2013-07-29 NOTE — Progress Notes (Signed)
ANTICOAGULATION CONSULT NOTE - Initial Consult  Pharmacy Consult for heparin Indication: chest pain/ACS  Allergies  Allergen Reactions  . Morphine And Related Nausea And Vomiting    Patient Measurements: Height: 6' (182.9 cm) Weight: 258 lb (117.028 kg) IBW/kg (Calculated) : 77.6 Heparin Dosing Weight: 100kg  Vital Signs: BP: 128/72 mmHg (08/18 1443) Pulse Rate: 82 (08/18 1443)  Labs: No results found for this basename: HGB, HCT, PLT, APTT, LABPROT, INR, HEPARINUNFRC, CREATININE, CKTOTAL, CKMB, TROPONINI,  in the last 72 hours  Estimated Creatinine Clearance: 75.8 ml/min (by C-G formula based on Cr of 1.3).   Medical History: Past Medical History  Diagnosis Date  . Hyperlipidemia   . Hypertension   . Coronary heart disease   . Diabetes mellitus     on insulin  . OSA (obstructive sleep apnea)   . Obesity   . CHF (congestive heart failure)   . VT (ventricular tachycardia)   . SVT (supraventricular tachycardia)   . PAD (peripheral artery disease)     Angiography in February of 2014: Moderate left iliac artery stenosis, mild to moderate diffuse right SFA disease. Severe proximal right popliteal artery stenosis with three-vessel runoff below the knee. Status post self-expanding stent placement to the proximal popliteal artery   Assessment: 67 year old male seen in office as a work in visit for shoulder and chest pain. He has known CAD and is currently being managed medically given difficult caths and procedures in the past. Patient now directly admitted to cardiac unit for further work up and possible cath tomorrow. Orders received to start heparin.  Goal of Therapy:  Heparin level 0.3-0.7 units/ml Monitor platelets by anticoagulation protocol: Yes   Plan:  Give 4000 units bolus x 1 Start heparin infusion at 1400 units/hr Check anti-Xa level in 6 hours and daily while on heparin Continue to monitor H&H and platelets  Sheppard Coil PharmD., BCPS Clinical  Pharmacist Pager [phone] 07/29/2013 4:04 PM

## 2013-07-29 NOTE — Telephone Encounter (Signed)
Left cp/shoulder pain since last night. Denies sob/ nausea, pain 3/10, no change in pain with movement or rest. Pt has CABG but has never had CP, no nitro available. Told pt to come for nurse/ ekg visit now, pt agreed to plan. I will take reading to DOD Dr Patty Sermons.

## 2013-07-30 ENCOUNTER — Encounter (HOSPITAL_COMMUNITY): Payer: Self-pay | Admitting: Nurse Practitioner

## 2013-07-30 DIAGNOSIS — R079 Chest pain, unspecified: Secondary | ICD-10-CM

## 2013-07-30 LAB — BASIC METABOLIC PANEL
BUN: 15 mg/dL (ref 6–23)
CO2: 23 mEq/L (ref 19–32)
Calcium: 9.2 mg/dL (ref 8.4–10.5)
Chloride: 105 mEq/L (ref 96–112)
Creatinine, Ser: 1.22 mg/dL (ref 0.50–1.35)
GFR calc Af Amer: 71 mL/min — ABNORMAL LOW (ref >90)
GFR calc non Af Amer: 61 mL/min — ABNORMAL LOW (ref >90)
Glucose, Bld: 138 mg/dL — ABNORMAL HIGH (ref 70–99)
Potassium: 4 mEq/L (ref 3.5–5.1)
Sodium: 137 mEq/L (ref 135–145)

## 2013-07-30 LAB — TSH: TSH: 2.055 u[IU]/mL (ref 0.350–4.500)

## 2013-07-30 LAB — CBC
HCT: 37.5 % — ABNORMAL LOW (ref 39.0–52.0)
Hemoglobin: 13.2 g/dL (ref 13.0–17.0)
RBC: 4.35 MIL/uL (ref 4.22–5.81)
RDW: 13.8 % (ref 11.5–15.5)
WBC: 7.4 10*3/uL (ref 4.0–10.5)

## 2013-07-30 LAB — LIPID PANEL
Cholesterol: 138 mg/dL (ref 0–200)
HDL: 25 mg/dL — ABNORMAL LOW (ref >39)
LDL Cholesterol: 49 mg/dL (ref 0–99)
Total CHOL/HDL Ratio: 5.5 RATIO
Triglycerides: 321 mg/dL — ABNORMAL HIGH (ref <150)
VLDL: 64 mg/dL — ABNORMAL HIGH (ref 0–40)

## 2013-07-30 LAB — TROPONIN I: Troponin I: <0.3 ng/mL (ref <0.30)

## 2013-07-30 LAB — HEMOGLOBIN A1C
Hgb A1c MFr Bld: 6.7 % — ABNORMAL HIGH (ref <5.7)
Mean Plasma Glucose: 146 mg/dL — ABNORMAL HIGH (ref <117)

## 2013-07-30 LAB — GLUCOSE, CAPILLARY: Glucose-Capillary: 196 mg/dL — ABNORMAL HIGH (ref 70–99)

## 2013-07-30 MED ORDER — LIRAGLUTIDE 18 MG/3ML ~~LOC~~ SOPN
1.2000 mg | PEN_INJECTOR | Freq: Every day | SUBCUTANEOUS | Status: DC
  Administered 2013-07-30: 1.2 mg via SUBCUTANEOUS

## 2013-07-30 MED ORDER — NITROGLYCERIN 0.4 MG SL SUBL: 0.4000 mg | SUBLINGUAL_TABLET | SUBLINGUAL | Status: DC | PRN

## 2013-07-30 NOTE — Progress Notes (Signed)
Discharged home by himself and may drive himself home as ordered  By MD. Discharge instructions given to pt. Belongings  with pt. Stable.

## 2013-07-30 NOTE — Progress Notes (Signed)
TELEMETRY: Reviewed telemetry pt in NSR with rare PVC and couplet.: Filed Vitals:   August 12, 2013 0008 12-Aug-2013 0335 08/12/2013 0500 08-12-2013 0752  BP: 117/52 135/70  139/71  Pulse: 75 76  80  Temp: 98.3 F (36.8 C) 97.7 F (36.5 C)  97.7 F (36.5 C)  TempSrc: Oral Oral  Oral  Resp: 28 22  26   Height:      Weight:  253 lb 12 oz (115.1 kg) 252 lb 13.9 oz (114.7 kg)   SpO2: 93% 95%  96%    Intake/Output Summary (Last 24 hours) at 12-Aug-2013 0914 Last data filed at August 12, 2013 0800  Gross per 24 hour  Intake 306.53 ml  Output   1100 ml  Net -793.47 ml    SUBJECTIVE Denies any chest pain or SOB. Describes episode of chest pain Sunday like a "Charlie horse". Afterwards felt irritated like a bruise. Did not have any Ntg to take.   LABS: Basic Metabolic Panel:  Recent Labs  78/29/56 1853 08/12/13 0630  NA 139 137  K 3.9 4.0  CL 104 105  CO2 22 23  GLUCOSE 109* 138*  BUN 16 15  CREATININE 1.10 1.22  CALCIUM 9.2 9.2  MG 2.2  --    Liver Function Tests:  Recent Labs  07/29/13 1853  AST 30  ALT 13  ALKPHOS 72  BILITOT 0.4  PROT 6.8  ALBUMIN 3.8  CBC:  Recent Labs  07/29/13 1853 08-12-2013 0630  WBC 8.5 7.4  NEUTROABS 4.1  --   HGB 14.5 13.2  HCT 40.7 37.5*  MCV 86.2 86.2  PLT 178 130*   Cardiac Enzymes:  Recent Labs  07/29/13 1935 07/29/13 2249 08-12-2013 0630  TROPONINI <0.30 <0.30 <0.30   BNP: 250.7  Hemoglobin A1C:  Recent Labs  07/29/13 1853  HGBA1C 6.7*   Fasting Lipid Panel:  Recent Labs  08/12/13 0630  CHOL 138  HDL 25*  LDLCALC 49  TRIG 213*  CHOLHDL 5.5   Thyroid Function Tests:  Recent Labs  07/29/13 1853  TSH 2.055   Radiology/Studies:  No results found.  Ecg 08-12-13- NSR, 1st degree AV block. Old anteroseptal infarct. This Ecg is unchanged from 01/16/13. Ecg 07/29/13- NSR, 1st degree AV block, Rightward axis, old anteroseptal infarct. T wave inversion inferiorly. This Ecg is unchanged from 01/18/12.  PHYSICAL  EXAM General: Well developed, obese, in no acute distress. Head: Normal Neck: Negative for carotid bruits. JVD not elevated. Lungs: Clear bilaterally to auscultation without wheezes, rales, or rhonchi. Breathing is unlabored. Heart: RRR S1 S2 without murmurs, rubs, or gallops.  Abdomen: Soft, non-tender, non-distended with normoactive bowel sounds. No hepatomegaly. No rebound/guarding. No obvious abdominal masses. Msk:  Strength and tone appears normal for age. Extremities: No clubbing, cyanosis or edema.  Distal pedal pulses are 2+ and equal bilaterally. Neuro: Alert and oriented X 3. Moves all extremities spontaneously. Psych:  Responds to questions appropriately with a normal affect.  ASSESSMENT AND PLAN: 1. Chest pain with atypical features. Patient has ruled out for MI. No recurrent chest pain. Cardiac cath 8/13 showed patent grafts ( after high risk Myoview study). Ecg shows fluctuating changes over past year and a half with right axis shift and T wave inversion inferiorly. I doubt this is ischemic in etiology. I favor a conservative approach unless he has ongoing chest pain or positive cardiac enzymes. Will DC IV heparin and Ntg. Continue prior meds. Ambulate in halls today and if no further chest pain will DC home later today.  Will need Rx for Ntg. Prn. 2. CAD s/p CABG 3. Chronic systolic CHF EF 40-45%.  4. DM 5. S/p ICD placement. 6. Obesity/ OSA  Principal Problem:   Chest pain at rest Active Problems:   Coronary artery disease-status post CABG   DM w/o Complication Type II   HYPERTENSION   OBSTRUCTIVE SLEEP APNEA   Systolic and diastolic CHF, chronic   PAD (peripheral artery disease)    Signed, Peter Swaziland MD,FACC 07/30/2013 9:14 AM

## 2013-07-30 NOTE — Discharge Summary (Signed)
Patient ID: Walter French,  MRN: [id number], DOB/AGE: 65/08/1946 65 y.o.  Admit date: 07/29/2013 Discharge date: 07/30/2013  Primary Care Provider: Jolene Provost Primary Cardiologist: P. Swaziland, MD   Discharge Diagnoses Principal Problem:   Chest pain at rest  **No objective evidence of ischemia ->Med Rx. Active Problems:   Coronary artery disease-status post CABG   DM w/o Complication Type II   HYPERLIPIDEMIA   HYPERTENSION   Systolic and diastolic CHF, chronic   OBSTRUCTIVE SLEEP APNEA   PAD (peripheral artery disease)  Allergies Allergies  Allergen Reactions  . Morphine And Related Nausea And Vomiting   Procedures  None  History of Present Illness  67 y/o male with h/o CAD s/p CABG x 3.  In 2013, he underwent stress testing that was felt to be high risk for ischemia.  Subsequent cath revealed 3/3 patent grafts and an occluded LCX, which was felt to explain ischemia on stress testing.  Medical therapy was recommended.  Pt had been doing well but on 8/16, awoke with left chest aching.  This recurred on 8/18, and was associated with dyspnea.  He was seen in the office as a work-in and there was concern for new inferior ECG changes.  He was admitted for further evaluation.  Hospital Course  Patient ruled out for MI.  He's had no further chest pain.  ECG's have been extensively reviewed.  ECG from this AM is similar to 01/16/13 ECG, with normal axis and no significant ST/T changes, while ECG performed in the office yesterday is similar to ECG from 01/18/12, with right axis and inferior TWI.  Thus changes noted in office yesterday were not felt to be new.  In the absence of objective evidence, we plan to discharge him home today in good condition.  We have refilled his prn NTG prescription, as he had not had that at home.  Discharge Vitals Blood pressure 139/78, pulse 83, temperature 97.7 F (36.5 C), temperature source Oral, resp. rate 25, height 6' (1.829 m), weight 252 lb 13.9 oz  (114.7 kg), SpO2 96.00%.  Filed Weights   07/29/13 1500 07/30/13 0335 07/30/13 0500  Weight: 258 lb (117.028 kg) 253 lb 12 oz (115.1 kg) 252 lb 13.9 oz (114.7 kg)    Labs  CBC  Recent Labs  07/29/13 1853 07/30/13 0630  WBC 8.5 7.4  NEUTROABS 4.1  --   HGB 14.5 13.2  HCT 40.7 37.5*  MCV 86.2 86.2  PLT 178 130*   Basic Metabolic Panel  Recent Labs  07/29/13 1853 07/30/13 0630  NA 139 137  K 3.9 4.0  CL 104 105  CO2 22 23  GLUCOSE 109* 138*  BUN 16 15  CREATININE 1.10 1.22  CALCIUM 9.2 9.2  MG 2.2  --    Liver Function Tests  Recent Labs  07/29/13 1853  AST 30  ALT 13  ALKPHOS 72  BILITOT 0.4  PROT 6.8  ALBUMIN 3.8   Cardiac Enzymes  Recent Labs  07/29/13 1935 07/29/13 2249 07/30/13 0630  TROPONINI <0.30 <0.30 <0.30   Hemoglobin A1C  Recent Labs  07/29/13 1853  HGBA1C 6.7*   Fasting Lipid Panel  Recent Labs  07/30/13 0630  CHOL 138  HDL 25*  LDLCALC 49  TRIG 621*  CHOLHDL 5.5   Thyroid Function Tests  Recent Labs  07/29/13 1853  TSH 2.055   Disposition  Pt is being discharged home today in good condition.  Follow-up Plans & Appointments  Follow-up Information   Follow up  with Peter Swaziland, MD On 10/11/2013. (11:15 AM)    Specialty:  Cardiology   Contact information:   [address] [place] [address] [phone]       Follow up with Jolene Provost, MD. (as scheduled.)    Specialty:  Family Medicine   Contact information:   [address]  [place] [address] [phone]      Discharge Medications    Medication List         allopurinol 300 MG tablet  Commonly known as:  ZYLOPRIM  Take 300 mg by mouth daily.     aspirin EC 81 MG tablet  Take 1 tablet (81 mg total) by mouth daily.     atorvastatin 40 MG tablet  Commonly known as:  LIPITOR  Take 1 tablet (40 mg total) by mouth daily.     fenofibrate 160 MG tablet  Take 160 mg by mouth daily.     glipiZIDE 10 MG tablet    Commonly known as:  GLUCOTROL  Take 10 mg by mouth daily.     HYDROcodone-acetaminophen 5-325 MG per tablet  Commonly known as:  NORCO/VICODIN  Take 2 tablets by mouth every 8 (eight) hours as needed. For pain     insulin detemir 100 UNIT/ML injection  Commonly known as:  LEVEMIR  Inject 80 Units into the skin 2 (two) times daily.     Liraglutide 18 MG/3ML Sopn  Inject 1.2 mg into the skin daily.     metoprolol succinate 100 MG 24 hr tablet  Commonly known as:  TOPROL-XL  Take 1 tablet (100 mg total) by mouth daily.     nitroGLYCERIN 0.4 MG SL tablet  Commonly known as:  NITROSTAT  Place 1 tablet (0.4 mg total) under the tongue every 5 (five) minutes x 3 doses as needed for chest pain.     potassium chloride SA 20 MEQ tablet  Commonly known as:  K-DUR,KLOR-CON  Take 20 mEq by mouth daily.     ranitidine 300 MG tablet  Commonly known as:  ZANTAC  Take 300 mg by mouth daily.     valsartan 160 MG tablet  Commonly known as:  DIOVAN  Take 1 tablet (160 mg total) by mouth daily.      Outstanding Labs/Studies  None  Duration of Discharge Encounter   Greater than 30 minutes including physician time.  Signed, Nicolasa Ducking NP 07/30/2013, 1:33 PM   Patient seen and examined and history reviewed. Agree with above findings and plan. No chest pain with ambulation. Will DC home today on prior meds. Instructed on use of Sl Ntg.   Thedora Hinders 07/30/2013 2:24 PM

## 2013-09-02 ENCOUNTER — Other Ambulatory Visit: Payer: Self-pay | Admitting: *Deleted

## 2013-09-02 MED ORDER — POTASSIUM CHLORIDE CRYS ER 20 MEQ PO TBCR: 20.0000 meq | EXTENDED_RELEASE_TABLET | Freq: Every day | ORAL | Status: DC

## 2013-09-20 ENCOUNTER — Inpatient Hospital Stay: Admit: 2013-09-20 | Payer: Self-pay | Admitting: Orthopedic Surgery

## 2013-09-20 ENCOUNTER — Encounter (HOSPITAL_COMMUNITY): Payer: Self-pay | Admitting: Emergency Medicine

## 2013-09-20 ENCOUNTER — Emergency Department (INDEPENDENT_AMBULATORY_CARE_PROVIDER_SITE_OTHER): Payer: Medicare Other

## 2013-09-20 ENCOUNTER — Emergency Department (INDEPENDENT_AMBULATORY_CARE_PROVIDER_SITE_OTHER)
Admission: EM | Admit: 2013-09-20 | Discharge: 2013-09-20 | Disposition: A | Discharge status: Other Acute Inpatient Hospital | Payer: Medicare Other | Source: Home / Self Care | Attending: Family Medicine | Admitting: Family Medicine

## 2013-09-20 ENCOUNTER — Encounter (HOSPITAL_COMMUNITY): Payer: Medicare Other | Admitting: Anesthesiology

## 2013-09-20 ENCOUNTER — Inpatient Hospital Stay (HOSPITAL_COMMUNITY): Payer: Medicare Other | Admitting: Anesthesiology

## 2013-09-20 ENCOUNTER — Encounter (HOSPITAL_COMMUNITY)
Admission: EM | Disposition: A | Discharge status: Other Acute Inpatient Hospital | Payer: Self-pay | Source: Home / Self Care | Attending: Family Medicine

## 2013-09-20 ENCOUNTER — Encounter (HOSPITAL_COMMUNITY): Payer: Self-pay | Admitting: *Deleted

## 2013-09-20 ENCOUNTER — Ambulatory Visit (HOSPITAL_COMMUNITY)
Admission: AD | Admit: 2013-09-20 | Discharge: 2013-09-20 | Disposition: A | Discharge status: Home / Self Care | Payer: Medicare Other | Source: Ambulatory Visit | Attending: Orthopedic Surgery | Admitting: Orthopedic Surgery

## 2013-09-20 ENCOUNTER — Encounter (HOSPITAL_COMMUNITY)
Admission: AD | Disposition: A | Discharge status: Home / Self Care | Payer: Self-pay | Source: Ambulatory Visit | Attending: Orthopedic Surgery

## 2013-09-20 ENCOUNTER — Other Ambulatory Visit: Payer: Self-pay | Admitting: Orthopedic Surgery

## 2013-09-20 DIAGNOSIS — S61412A Laceration without foreign body of left hand, initial encounter: Secondary | ICD-10-CM

## 2013-09-20 DIAGNOSIS — S61209A Unspecified open wound of unspecified finger without damage to nail, initial encounter: Secondary | ICD-10-CM

## 2013-09-20 DIAGNOSIS — E119 Type 2 diabetes mellitus without complications: Secondary | ICD-10-CM | POA: Insufficient documentation

## 2013-09-20 DIAGNOSIS — S65509A Unspecified injury of blood vessel of unspecified finger, initial encounter: Secondary | ICD-10-CM | POA: Insufficient documentation

## 2013-09-20 DIAGNOSIS — X58XXXA Exposure to other specified factors, initial encounter: Secondary | ICD-10-CM | POA: Insufficient documentation

## 2013-09-20 DIAGNOSIS — IMO0002 Reserved for concepts with insufficient information to code with codable children: Secondary | ICD-10-CM | POA: Insufficient documentation

## 2013-09-20 DIAGNOSIS — I509 Heart failure, unspecified: Secondary | ICD-10-CM | POA: Insufficient documentation

## 2013-09-20 DIAGNOSIS — I10 Essential (primary) hypertension: Secondary | ICD-10-CM | POA: Insufficient documentation

## 2013-09-20 DIAGNOSIS — Z23 Encounter for immunization: Secondary | ICD-10-CM

## 2013-09-20 HISTORY — PX: NERVE, TENDON AND ARTERY REPAIR: SHX5695

## 2013-09-20 LAB — CBC WITH DIFFERENTIAL/PLATELET
Basophils Relative: 0 % (ref 0–1)
Eosinophils Absolute: 0.5 10*3/uL (ref 0.0–0.7)
Eosinophils Relative: 7 % — ABNORMAL HIGH (ref 0–5)
Lymphs Abs: 2.3 10*3/uL (ref 0.7–4.0)
MCH: 30.3 pg (ref 26.0–34.0)
MCHC: 35.2 g/dL (ref 30.0–36.0)
MCV: 86 fL (ref 78.0–100.0)
Neutrophils Relative %: 55 % (ref 43–77)
Platelets: 156 10*3/uL (ref 150–400)
RBC: 4.92 MIL/uL (ref 4.22–5.81)
RDW: 13.2 % (ref 11.5–15.5)

## 2013-09-20 LAB — GLUCOSE, CAPILLARY
Glucose-Capillary: 88 mg/dL (ref 70–99)
Glucose-Capillary: 72 mg/dL (ref 70–99)
Glucose-Capillary: 80 mg/dL (ref 70–99)
Glucose-Capillary: 94 mg/dL (ref 70–99)

## 2013-09-20 LAB — POCT I-STAT, CHEM 8
Creatinine, Ser: 1.3 mg/dL (ref 0.50–1.35)
Glucose, Bld: 222 mg/dL — ABNORMAL HIGH (ref 70–99)
HCT: 45 % (ref 39.0–52.0)
Hemoglobin: 15.3 g/dL (ref 13.0–17.0)
Sodium: 140 mEq/L (ref 135–145)
TCO2: 27 mmol/L (ref 0–100)

## 2013-09-20 SURGERY — NERVE, TENDON AND ARTERY REPAIR
Anesthesia: General | Laterality: Left

## 2013-09-20 SURGERY — NERVE, TENDON AND ARTERY REPAIR
Anesthesia: General | Site: Finger | Laterality: Left | Wound class: Dirty or Infected

## 2013-09-20 MED ORDER — CEPHALEXIN 500 MG PO CAPS: 500.0000 mg | ORAL_CAPSULE | Freq: Four times a day (QID) | ORAL | Status: DC

## 2013-09-20 MED ORDER — PROMETHAZINE HCL 25 MG/ML IJ SOLN: 6.2500 mg | INTRAMUSCULAR | Status: DC | PRN

## 2013-09-20 MED ORDER — LIDOCAINE HCL (PF) 1 % IJ SOLN
INTRAMUSCULAR | Status: DC | PRN
  Administered 2013-09-20: 5 mL

## 2013-09-20 MED ORDER — MEPERIDINE HCL 25 MG/ML IJ SOLN: 6.2500 mg | INTRAMUSCULAR | Status: DC | PRN

## 2013-09-20 MED ORDER — LACTATED RINGERS IV SOLN
INTRAVENOUS | Status: DC
  Administered 2013-09-20: 14:00:00 via INTRAVENOUS

## 2013-09-20 MED ORDER — BUPIVACAINE HCL (PF) 0.25 % IJ SOLN
INTRAMUSCULAR | Status: AC
  Filled 2013-09-20: qty 30

## 2013-09-20 MED ORDER — MIDAZOLAM HCL 5 MG/5ML IJ SOLN
INTRAMUSCULAR | Status: DC | PRN
  Administered 2013-09-20 (×3): 1 mg via INTRAVENOUS

## 2013-09-20 MED ORDER — SODIUM CHLORIDE 0.9 % IR SOLN
Status: DC | PRN
  Administered 2013-09-20: 3000 mL
  Administered 2013-09-20: 1

## 2013-09-20 MED ORDER — FENTANYL CITRATE 0.05 MG/ML IJ SOLN: 25.0000 ug | INTRAMUSCULAR | Status: DC | PRN

## 2013-09-20 MED ORDER — LIDOCAINE HCL (PF) 1 % IJ SOLN
INTRAMUSCULAR | Status: AC
  Filled 2013-09-20: qty 30

## 2013-09-20 MED ORDER — DEXTROSE 50 % IV SOLN
INTRAVENOUS | Status: AC
  Administered 2013-09-20: 25 mL via INTRAVENOUS
  Filled 2013-09-20: qty 50

## 2013-09-20 MED ORDER — TETANUS-DIPHTH-ACELL PERTUSSIS 5-2.5-18.5 LF-MCG/0.5 IM SUSP
0.5000 mL | Freq: Once | INTRAMUSCULAR | Status: AC
  Administered 2013-09-20: 0.5 mL via INTRAMUSCULAR

## 2013-09-20 MED ORDER — MIDAZOLAM HCL 2 MG/2ML IJ SOLN: 0.5000 mg | Freq: Once | INTRAMUSCULAR | Status: DC | PRN

## 2013-09-20 MED ORDER — OXYCODONE HCL 5 MG/5ML PO SOLN: 5.0000 mg | Freq: Once | ORAL | Status: DC | PRN

## 2013-09-20 MED ORDER — TETANUS-DIPHTH-ACELL PERTUSSIS 5-2.5-18.5 LF-MCG/0.5 IM SUSP
INTRAMUSCULAR | Status: AC
  Filled 2013-09-20: qty 0.5

## 2013-09-20 MED ORDER — CEFAZOLIN SODIUM-DEXTROSE 2-3 GM-% IV SOLR
2.0000 g | INTRAVENOUS | Status: AC
  Administered 2013-09-20: 2 g via INTRAVENOUS
  Filled 2013-09-20: qty 50

## 2013-09-20 MED ORDER — HYDROCODONE-ACETAMINOPHEN 5-325 MG PO TABS: 2.0000 | ORAL_TABLET | Freq: Four times a day (QID) | ORAL | Status: DC | PRN

## 2013-09-20 MED ORDER — CHLORHEXIDINE GLUCONATE 4 % EX LIQD: 60.0000 mL | Freq: Once | CUTANEOUS | Status: DC

## 2013-09-20 MED ORDER — BUPIVACAINE HCL (PF) 0.25 % IJ SOLN
INTRAMUSCULAR | Status: DC | PRN
  Administered 2013-09-20: 5 mL

## 2013-09-20 MED ORDER — OXYCODONE HCL 5 MG PO TABS: 5.0000 mg | ORAL_TABLET | Freq: Once | ORAL | Status: DC | PRN

## 2013-09-20 MED ORDER — FENTANYL CITRATE 0.05 MG/ML IJ SOLN
INTRAMUSCULAR | Status: DC | PRN
  Administered 2013-09-20 (×3): 50 ug via INTRAVENOUS

## 2013-09-20 MED ORDER — SODIUM CHLORIDE 0.45 % IV SOLN: INTRAVENOUS | Status: DC

## 2013-09-20 MED ORDER — DEXTROSE 50 % IV SOLN
25.0000 mL | Freq: Once | INTRAVENOUS | Status: AC
  Administered 2013-09-20: 25 mL via INTRAVENOUS

## 2013-09-20 MED ORDER — LACTATED RINGERS IV SOLN
INTRAVENOUS | Status: DC | PRN
  Administered 2013-09-20: 17:00:00 via INTRAVENOUS

## 2013-09-20 SURGICAL SUPPLY — 63 items
BANDAGE ELASTIC 3 VELCRO ST LF (GAUZE/BANDAGES/DRESSINGS) ×1 IMPLANT
BANDAGE ELASTIC 4 VELCRO ST LF (GAUZE/BANDAGES/DRESSINGS) ×1 IMPLANT
BANDAGE GAUZE 4  KLING STR (GAUZE/BANDAGES/DRESSINGS) ×1 IMPLANT
BANDAGE GAUZE ELAST BULKY 4 IN (GAUZE/BANDAGES/DRESSINGS) ×1 IMPLANT
BNDG COHESIVE 1X5 TAN STRL LF (GAUZE/BANDAGES/DRESSINGS) IMPLANT
CANISTER SUCTION 1500CC (MISCELLANEOUS) ×2 IMPLANT
CLOTH BEACON ORANGE TIMEOUT ST (SAFETY) ×2 IMPLANT
CONNECTOR NERVE AXOGARD 1.5X15 (Tissue) ×1 IMPLANT
CORDS BIPOLAR (ELECTRODE) ×2 IMPLANT
COVER SURGICAL LIGHT HANDLE (MISCELLANEOUS) ×2 IMPLANT
CUFF TOURNIQUET SINGLE 18IN (TOURNIQUET CUFF) ×2 IMPLANT
CUFF TOURNIQUET SINGLE 24IN (TOURNIQUET CUFF) IMPLANT
DECANTER SPIKE VIAL GLASS SM (MISCELLANEOUS) ×2 IMPLANT
DRAPE SURG 17X23 STRL (DRAPES) ×2 IMPLANT
DRSG EMULSION OIL 3X3 NADH (GAUZE/BANDAGES/DRESSINGS) ×1 IMPLANT
GAUZE SPONGE 2X2 8PLY STRL LF (GAUZE/BANDAGES/DRESSINGS) IMPLANT
GAUZE XEROFORM 1X8 LF (GAUZE/BANDAGES/DRESSINGS) ×1 IMPLANT
GLOVE BIOGEL M STRL SZ7.5 (GLOVE) ×3 IMPLANT
GLOVE BIOGEL PI IND STRL 6.5 (GLOVE) IMPLANT
GLOVE BIOGEL PI IND STRL 8 (GLOVE) IMPLANT
GLOVE BIOGEL PI INDICATOR 6.5 (GLOVE) ×1
GLOVE BIOGEL PI INDICATOR 8 (GLOVE) ×2
GLOVE SS BIOGEL STRL SZ 8 (GLOVE) ×1 IMPLANT
GLOVE SUPERSENSE BIOGEL SZ 8 (GLOVE) ×1
GLOVE SURG SS PI 8.0 STRL IVOR (GLOVE) ×2 IMPLANT
GOWN PREVENTION PLUS XLARGE (GOWN DISPOSABLE) ×2 IMPLANT
GOWN STRL NON-REIN LRG LVL3 (GOWN DISPOSABLE) ×4 IMPLANT
GOWN STRL REIN XL XLG (GOWN DISPOSABLE) ×4 IMPLANT
KIT BASIN OR (CUSTOM PROCEDURE TRAY) ×2 IMPLANT
KIT ROOM TURNOVER OR (KITS) ×2 IMPLANT
LOOP VESSEL MAXI BLUE (MISCELLANEOUS) IMPLANT
MANIFOLD NEPTUNE II (INSTRUMENTS) ×2 IMPLANT
NDL HYPO 25GX1X1/2 BEV (NEEDLE) IMPLANT
NEEDLE HYPO 25GX1X1/2 BEV (NEEDLE) ×4 IMPLANT
NS IRRIG 1000ML POUR BTL (IV SOLUTION) ×2 IMPLANT
PACK ORTHO EXTREMITY (CUSTOM PROCEDURE TRAY) ×2 IMPLANT
PAD ARMBOARD 7.5X6 YLW CONV (MISCELLANEOUS) ×4 IMPLANT
PAD CAST 4YDX4 CTTN HI CHSV (CAST SUPPLIES) IMPLANT
PADDING CAST COTTON 4X4 STRL (CAST SUPPLIES) ×2
PROTECTOR NERVE AXOGUARD 2X20 (Nerve Graft) ×1 IMPLANT
SOLUTION BETADINE 4OZ (MISCELLANEOUS) ×2 IMPLANT
SPEAR EYE SURG WECK-CEL (MISCELLANEOUS) ×1 IMPLANT
SPECIMEN JAR SMALL (MISCELLANEOUS) ×2 IMPLANT
SPLINT FIBERGLASS 3X35 (CAST SUPPLIES) ×1 IMPLANT
SPONGE GAUZE 2X2 STER 10/PKG (GAUZE/BANDAGES/DRESSINGS)
SPONGE GAUZE 4X4 12PLY (GAUZE/BANDAGES/DRESSINGS) ×1 IMPLANT
SPONGE SCRUB IODOPHOR (GAUZE/BANDAGES/DRESSINGS) ×2 IMPLANT
SUCTION FRAZIER TIP 10 FR DISP (SUCTIONS) IMPLANT
SUT CHROMIC 5 0 P 3 (SUTURE) ×1 IMPLANT
SUT ETHILON 10 0 V75 3 (SUTURE) ×1 IMPLANT
SUT ETHILON 8 0 BV130 4 (SUTURE) ×1 IMPLANT
SUT MERSILENE 4 0 P 3 (SUTURE) IMPLANT
SUT PROLENE 4 0 PS 2 18 (SUTURE) ×1 IMPLANT
SUT PROLENE 5 0 PS 2 (SUTURE) ×1 IMPLANT
SUT VIC AB 2-0 CT1 27 (SUTURE)
SUT VIC AB 2-0 CT1 TAPERPNT 27 (SUTURE) IMPLANT
SYR CONTROL 10ML LL (SYRINGE) ×2 IMPLANT
TOWEL OR 17X24 6PK STRL BLUE (TOWEL DISPOSABLE) ×2 IMPLANT
TOWEL OR 17X26 10 PK STRL BLUE (TOWEL DISPOSABLE) ×2 IMPLANT
TUBE CONNECTING 12X1/4 (SUCTIONS) IMPLANT
TUBING CYSTO DISP (UROLOGICAL SUPPLIES) ×1 IMPLANT
UNDERPAD 30X30 INCONTINENT (UNDERPADS AND DIAPERS) ×2 IMPLANT
WATER STERILE IRR 1000ML POUR (IV SOLUTION) ×2 IMPLANT

## 2013-09-20 NOTE — ED Provider Notes (Signed)
CSN: [id number]     Arrival date & time 09/20/13  1008 History   First MD Initiated Contact with Patient 09/20/13 1014     Chief Complaint  Patient presents with  . Laceration   (Consider location/radiation/quality/duration/timing/severity/associated sxs/prior Treatment) Patient is a 67 y.o. male presenting with skin laceration. The history is provided by the patient.  Laceration Location:  Finger Finger laceration location:  L index finger Length (cm):  1.5 Depth:  Through dermis Quality: straight   Bleeding: arterial and controlled with pressure   Time since incident:  1 hour Laceration mechanism:  Metal edge (sheet metal roofing material.) Pain details:    Quality:  Numbness   Severity:  Mild   Progression:  Unchanged Foreign body present:  No foreign bodies Tetanus status:  Out of date   Past Medical History  Diagnosis Date  . Hyperlipidemia   . Hypertension   . Coronary heart disease     a. s/p CABG x 3 (VG->PDA, VG->PL, LIMA->LAD);  b. 2013 Abnl Myoview;  c. 2013 Cath: 3/3 patent grafts, occluded LCX which correlated w/ ischemia on MV->not amenable to PCI->Med Rx.  . Obesity   . CHF (congestive heart failure)   . VT (ventricular tachycardia)   . SVT (supraventricular tachycardia)   . PAD (peripheral artery disease)     Angiography in February of 2014: Moderate left iliac artery stenosis, mild to moderate diffuse right SFA disease. Severe proximal right popliteal artery stenosis with three-vessel runoff below the knee. Status post self-expanding stent placement to the proximal popliteal artery  . Automatic implantable cardioverter-defibrillator in situ   . Asthma     "small touch" (07/29/2013)  . History of pneumonia     "3-4 times; last time ~ 2003" (07/29/2013)  . OSA on CPAP   . Type II diabetes mellitus     on insulin  . GERD (gastroesophageal reflux disease)   . H/O hiatal hernia   . Gout   . Anxiety    Past Surgical History  Procedure Laterality Date  .  Tonsillectomy    . Coronary artery bypass graft  1997; 2007    "X3; X3" (07/29/2013)  . Cardiac catheterization    . Wrist fracture surgery Right   . Cataract extraction w/ intraocular lens implant Left   . Cardiac defibrillator placement  1997; ~ 2003   Family History  Problem Relation Age of Onset  . Heart disease Brother   . Cancer Brother   . Breast cancer Mother    History  Substance Use Topics  . Smoking status: Former Smoker -- 2.00 packs/day for 30 years    Types: Cigarettes    Quit date: 12/13/1995  . Smokeless tobacco: Never Used  . Alcohol Use: Yes     Comment: 07/29/2013 "not touched any alcohol since 1997"    Review of Systems  Constitutional: Negative.   Neurological: Positive for numbness.    Allergies  Morphine and related  Home Medications   Current Outpatient Rx  Name  Route  Sig  Dispense  Refill  . allopurinol (ZYLOPRIM) 300 MG tablet   Oral   Take 300 mg by mouth daily.           [name] aspirin EC 81 MG tablet   Oral   Take 1 tablet (81 mg total) by mouth daily.         [name] atorvastatin (LIPITOR) 40 MG tablet   Oral   Take 1 tablet (40 mg total) by mouth daily.  30 tablet   5   . fenofibrate 160 MG tablet   Oral   Take 160 mg by mouth daily.           [name] glipiZIDE (GLUCOTROL) 10 MG tablet   Oral   Take 10 mg by mouth daily.          [name] HYDROcodone-acetaminophen (NORCO/VICODIN) 5-325 MG per tablet   Oral   Take 2 tablets by mouth every 8 (eight) hours as needed. For pain         . insulin detemir (LEVEMIR) 100 UNIT/ML injection   Subcutaneous   Inject 80 Units into the skin 2 (two) times daily.          . Liraglutide 18 MG/3ML SOPN   Subcutaneous   Inject 1.2 mg into the skin daily.         . metoprolol succinate (TOPROL-XL) 100 MG 24 hr tablet   Oral   Take 1 tablet (100 mg total) by mouth daily.   90 tablet   3   . nitroGLYCERIN (NITROSTAT) 0.4 MG SL tablet   Sublingual   Place 1 tablet (0.4 mg total) under the  tongue every 5 (five) minutes x 3 doses as needed for chest pain.   25 tablet   3   . potassium chloride SA (K-DUR,KLOR-CON) 20 MEQ tablet   Oral   Take 1 tablet (20 mEq total) by mouth daily.   30 tablet   2   . ranitidine (ZANTAC) 300 MG tablet   Oral   Take 300 mg by mouth daily.         . valsartan (DIOVAN) 160 MG tablet   Oral   Take 1 tablet (160 mg total) by mouth daily.   30 tablet   5    BP 157/93  Pulse 91  Temp(Src) 97.5 F (36.4 C) (Oral)  Resp 22  SpO2 97% Physical Exam  Nursing note and vitals reviewed. Constitutional: He is oriented to person, place, and time. He appears well-developed and well-nourished. No distress.  Musculoskeletal: He exhibits tenderness.       Hands: Neurological: He is alert and oriented to person, place, and time.  Skin: Skin is warm and dry.    ED Course  Procedures (including critical care time) Labs Review Labs Reviewed  CBC WITH DIFFERENTIAL - Abnormal; Notable for the following:    Eosinophils Relative 7 (*)    All other components within normal limits  POCT I-STAT, CHEM 8 - Abnormal; Notable for the following:    Glucose, Bld 222 (*)    All other components within normal limits   Imaging Review Dg Chest 2 View  09/20/2013   CLINICAL DATA:  Short of breath. History of hypertension.  EXAM: CHEST  2 VIEW  COMPARISON:  08/02/2012  FINDINGS: Changes from CABG surgery are stable. The cardiac silhouette is normal in size. No mediastinal or hilar masses.  Minor reticular interstitial thickening is noted at the peripheral lung bases. The lungs are otherwise clear. No pleural effusion or pneumothorax.  Left anterior chest wall pacemaker is stable. The bony thorax is demineralized but intact.  IMPRESSION: No acute cardiopulmonary disease.   Electronically Signed   By: Amie Portland M.D.   On: 09/20/2013 11:30    EKG Interpretation     Ventricular Rate:    PR Interval:    QRS Duration:   QT Interval:    QTC Calculation:     R Axis:     Text  Interpretation:              MDM  Referred to dr Amanda Pea for nerve lac to finger, will see and take to OR at hosp.    Linna Hoff, MD 09/20/13 1224

## 2013-09-20 NOTE — Progress Notes (Signed)
Dr. Jean Rosenthal notified of CBG.

## 2013-09-20 NOTE — Anesthesia Preprocedure Evaluation (Addendum)
Anesthesia Evaluation  Patient identified by MRN, date of birth, ID band Patient awake    Reviewed: Allergy & Precautions, H&P , NPO status , Patient's Chart, lab work & pertinent test results  History of Anesthesia Complications Negative for: history of anesthetic complications  Airway Mallampati: II TM Distance: <3 FB Neck ROM: Full    Dental  (+) Edentulous Upper and Edentulous Lower   Pulmonary asthma , sleep apnea and Continuous Positive Airway Pressure Ventilation , former smoker (quit 1997),  breath sounds clear to auscultation  Pulmonary exam normal       Cardiovascular hypertension, Pt. on medications + angina with exertion + CAD, + CABG, + Peripheral Vascular Disease and +CHF + dysrhythmias Supra Ventricular Tachycardia and Ventricular Tachycardia + Cardiac Defibrillator Rhythm:Regular Rate:Normal  '13 stress test: apical hypokinesis with lateral reversibility, EF 32% '12 ECHO: EF 45-50% with apical hypokinesis  Dr Swaziland: area of ischemia corresponds to Cx disease which is not ameniable to intervention, medical management only   Neuro/Psych negative neurological ROS     GI/Hepatic Neg liver ROS, GERD-  Medicated and Controlled,  Endo/Other  diabetes (glu 70), Type 2, Oral Hypoglycemic Agents and Insulin DependentMorbid obesity  Renal/GU negative Renal ROS     Musculoskeletal   Abdominal (+) + obese,   Peds  Hematology   Anesthesia Other Findings   Reproductive/Obstetrics                        Anesthesia Physical Anesthesia Plan  ASA: IV  Anesthesia Plan: General   Post-op Pain Management:    Induction: Intravenous  Airway Management Planned: Oral ETT  Additional Equipment:   Intra-op Plan:   Post-operative Plan: Extubation in OR  Informed Consent: I have reviewed the patients History and Physical, chart, labs and discussed the procedure including the risks, benefits and  alternatives for the proposed anesthesia with the patient or authorized representative who has indicated his/her understanding and acceptance.     Plan Discussed with: CRNA and Surgeon  Anesthesia Plan Comments: (Plan routine monitors, GETA)        Anesthesia Quick Evaluation

## 2013-09-20 NOTE — Op Note (Signed)
See Dictation #[id number] Dominica Severin MD

## 2013-09-20 NOTE — Progress Notes (Signed)
Spoke to Roff with [employer] and she advised that device would not deliver therapy if magnet was used. She advised that if interrogation is needed post op that PACU has the ability to do this and that some one will look at it from [employer] and determine if device is ok.

## 2013-09-20 NOTE — Transfer of Care (Signed)
Immediate Anesthesia Transfer of Care Note  Patient: Walter French  Procedure(s) Performed: Procedure(s): IRRIGATION AND DEBRIDEMENT,Exploration and Repair of Ulnar/Digital  Artery Nerve of Left Index finger (Left)  Patient Location: PACU  Anesthesia Type:MAC  Level of Consciousness: awake, alert  and oriented  Airway & Oxygen Therapy: Patient Spontanous Breathing  Post-op Assessment: Report given to PACU RN, Post -op Vital signs reviewed and stable, Patient moving all extremities, Patient moving all extremities X 4 and Patient able to stick tongue midline  Post vital signs: Reviewed and stable  Complications: No apparent anesthesia complications

## 2013-09-20 NOTE — Anesthesia Postprocedure Evaluation (Signed)
Anesthesia Post Note  Patient: Walter French  Procedure(s) Performed: Procedure(s) (LRB): IRRIGATION AND DEBRIDEMENT,Exploration and Repair of Ulnar/Digital  Artery Nerve of Left Index finger (Left)  Anesthesia type: general  Patient location: PACU  Post pain: Pain level controlled  Post assessment: Patient's Cardiovascular Status Stable  Last Vitals:  Filed Vitals:   09/20/13 1914  BP:   Pulse: 80  Temp: 36.5 C  Resp: 26    Post vital signs: Reviewed and stable  Level of consciousness: sedated  Complications: No apparent anesthesia complications

## 2013-09-20 NOTE — ED Notes (Signed)
Reportedly cut left index finger while cleaning up his yard ; actively bleeding w/o direct pressure

## 2013-09-20 NOTE — Anesthesia Procedure Notes (Signed)
Procedure Name: MAC Date/Time: 09/20/2013 5:30 PM Performed by: Ellin Goodie Pre-anesthesia Checklist: Patient identified, Emergency Drugs available, Suction available, Patient being monitored and Timeout performed Patient Re-evaluated:Patient Re-evaluated prior to inductionOxygen Delivery Method: Simple face mask Preoxygenation: Pre-oxygenation with 100% oxygen Intubation Type: IV induction

## 2013-09-20 NOTE — Progress Notes (Signed)
Offered to lock up patients money multiple times. Patient refused told patient that [facility] would not responsible for lost/ stolen money patient verbalized understanding.

## 2013-09-20 NOTE — Progress Notes (Signed)
All belongings given to [facility] [address] at bedside by patient

## 2013-09-20 NOTE — Preoperative (Signed)
Beta Blockers   Reason not to administer Beta Blockers:Not Applicable 

## 2013-09-20 NOTE — H&P (Signed)
.. Walter French is an 67 y.o. male.    Chief Complaint: I cut my finger HPI: The patient is a pleasant 67 year old male who presents to the [facility] urgent care evaluation of his left index finger. Sustained a laceration injury to left index finger earlier this morning while trying to lift a heavy piece of sheet metal. The patient noted a significant amount of bleeding and immediate numbness about the ulnar aspect of the index finger. He was seen and evaluated urgent care setting and was noted to have a laceration about the volar ulnar aspect of the index finger with probable ulnar nerve laceration given his examination. Hand surgery consultation was implemented. On evaluation he was noted to have a 2 cm laceration about the left index finger volar to dorsal in nature ulnarly located. FDS and FDP were intact. Radial digital nerve sensation was intact. The patient was very good sensation about the ulnar digital nerve based on pinprick testing and 2. Discrimination.  Time of his evaluation hemostasis was present. No other injury was noted. Patient had had his tetanus updated at the urgent care.  Past Medical History  Diagnosis Date  . Hyperlipidemia   . Hypertension   . Coronary heart disease     a. s/p CABG x 3 (VG->PDA, VG->PL, LIMA->LAD);  b. 2013 Abnl Myoview;  c. 2013 Cath: 3/3 patent grafts, occluded LCX which correlated w/ ischemia on MV->not amenable to PCI->Med Rx.  . Obesity   . CHF (congestive heart failure)   . VT (ventricular tachycardia)   . SVT (supraventricular tachycardia)   . PAD (peripheral artery disease)     Angiography in February of 2014: Moderate left iliac artery stenosis, mild to moderate diffuse right SFA disease. Severe proximal right popliteal artery stenosis with three-vessel runoff below the knee. Status post self-expanding stent placement to the proximal popliteal artery  . Automatic implantable cardioverter-defibrillator in situ   . Asthma     "small touch"  (07/29/2013)  . History of pneumonia     "3-4 times; last time ~ 2003" (07/29/2013)  . OSA on CPAP   . Type II diabetes mellitus     on insulin  . GERD (gastroesophageal reflux disease)   . H/O hiatal hernia   . Gout   . Anxiety     Past Surgical History  Procedure Laterality Date  . Tonsillectomy    . Coronary artery bypass graft  1997; 2007    "X3; X3" (07/29/2013)  . Cardiac catheterization    . Wrist fracture surgery Right   . Cataract extraction w/ intraocular lens implant Left   . Cardiac defibrillator placement  1997; ~ 2003    Family History  Problem Relation Age of Onset  . Heart disease Brother   . Cancer Brother   . Breast cancer Mother     Social History:  reports that he quit smoking about 17 years ago. His smoking use included Cigarettes. He has a 60 pack-year smoking history. He has never used smokeless tobacco. He reports that he drinks alcohol. He reports that he does not use illicit drugs.  Allergies:  Allergies  Allergen Reactions  . Morphine And Related Nausea And Vomiting     (Not in a hospital admission)  Results for orders placed during the hospital encounter of 09/20/13 (from the past 48 hour(s))  CBC WITH DIFFERENTIAL     Status: Abnormal   Collection Time    09/20/13 10:47 AM      Result Value Range  WBC 7.5  4.0 - 10.5 K/uL   RBC 4.92  4.22 - 5.81 MIL/uL   Hemoglobin 14.9  13.0 - 17.0 g/dL   HCT 95.6  21.3 - 08.6 %   MCV 86.0  78.0 - 100.0 fL   MCH 30.3  26.0 - 34.0 pg   MCHC 35.2  30.0 - 36.0 g/dL   RDW 57.8  46.9 - 62.9 %   Platelets 156  150 - 400 K/uL   Neutrophils Relative % 55  43 - 77 %   Neutro Abs 4.1  1.7 - 7.7 K/uL   Lymphocytes Relative 30  12 - 46 %   Lymphs Abs 2.3  0.7 - 4.0 K/uL   Monocytes Relative 8  3 - 12 %   Monocytes Absolute 0.6  0.1 - 1.0 K/uL   Eosinophils Relative 7 (*) 0 - 5 %   Eosinophils Absolute 0.5  0.0 - 0.7 K/uL   Basophils Relative 0  0 - 1 %   Basophils Absolute 0.0  0.0 - 0.1 K/uL  POCT  I-STAT, CHEM 8     Status: Abnormal   Collection Time    09/20/13 10:58 AM      Result Value Range   Sodium 140  135 - 145 mEq/L   Potassium 4.3  3.5 - 5.1 mEq/L   Chloride 104  96 - 112 mEq/L   BUN 15  6 - 23 mg/dL   Creatinine, Ser 5.28  0.50 - 1.35 mg/dL   Glucose, Bld 413 (*) 70 - 99 mg/dL   Calcium, Ion 2.44  0.10 - 1.30 mmol/L   TCO2 27  0 - 100 mmol/L   Hemoglobin 15.3  13.0 - 17.0 g/dL   HCT 27.2  53.6 - 64.4 %   Dg Chest 2 View  09/20/2013   CLINICAL DATA:  Short of breath. History of hypertension.  EXAM: CHEST  2 VIEW  COMPARISON:  08/02/2012  FINDINGS: Changes from CABG surgery are stable. The cardiac silhouette is normal in size. No mediastinal or hilar masses.  Minor reticular interstitial thickening is noted at the peripheral lung bases. The lungs are otherwise clear. No pleural effusion or pneumothorax.  Left anterior chest wall pacemaker is stable. The bony thorax is demineralized but intact.  IMPRESSION: No acute cardiopulmonary disease.   Electronically Signed   By: Amie Portland M.D.   On: 09/20/2013 11:30     ROS   Patient denies any recent weight loss, fever, chills he does have chronic shortness of breath secondary to congestive heart failure and COPD. He denies any current chest pain but was seen and evaluated in August for atypical angina, is under the supervision of Dr. [place]. Denies any difficulties with bowel bladder movements.   Blood pressure 157/93, pulse 91, temperature 97.5 F (36.4 C), temperature source Oral, resp. rate 22, SpO2 97.00%.  Physical Exam  Patient is pleasant alert and oriented x3..The patient is alert and oriented in no acute distress the patient complains of pain in the affected upper extremity.  The patient is noted to have a normal HEENT exam.  Lung fields show equal chest expansion and no shortness of breath  abdomen exam is nontender without distention.  Lower extremity examination does not show any fracture dislocation or blood  clot symptoms.  Pelvis is stable neck and back are stable and nontender Please see history of present illness the evaluation of the left index finger  Assessment/Plan Left index finger laceration with ulnar digital nerve deficit  most likely ulnar digital artery laceration I have discussed with the patient had recommendations for surgical exploration and repair of the nerve, tendon, arteries as necessary. We have had a lengthy discussion today in regards to this the patient like to try to avoid a general anesthetic. Have contacted Dr. Elvis Coil office for possible medical clearance currently per Dr. Maggie Font the patient is stable no contraindications for general anesthesia, however, we will try to perform this under a local digital block given the patient's desires to avoid a general anesthesia.Marland KitchenMarland KitchenWe are planning surgery for your upper extremity. The risk and benefits of surgery include risk of bleeding infection anesthesia damage to normal structures and failure of the surgery to accomplish its intended goals of relieving symptoms and restoring function with this in mind we'll going to proceed. I have specifically discussed with the patient the pre-and postoperative regime and the does and don'ts and risk and benefits in great detail. Risk and benefits of surgery also include risk of dystrophy chronic nerve pain failure of the healing process to go onto completion and other inherent risks of surgery The relavent the pathophysiology of the disease/injury process, as well as the alternatives for treatment and postoperative course of action has been discussed in great detail with the patient who desires to proceed.  We will do everything in our power to help you (the patient) restore function to the upper extremity. Is a pleasure to see this patient today.   Karcyn Menn L 09/20/2013, 12:44 PM

## 2013-09-23 NOTE — Op Note (Signed)
Walter French, Walter French               ACCOUNT NO.:  [id number]  MEDICAL RECORD NO.:  [id number]  LOCATION:                               FACILITY:  [facility]  PHYSICIAN:  Dionne Ano. Jaydan Chretien, M.D.DATE OF BIRTH:  [birth year]  DATE OF PROCEDURE:  09/20/2013 DATE OF DISCHARGE:  09/20/2013                              OPERATIVE REPORT   PREOPERATIVE DIAGNOSIS:  Laceration, left index finger with nerve and artery injury.  POSTOPERATIVE DIAGNOSIS:  Laceration, left index finger with nerve and artery injury.  PROCEDURE: 1. Irrigation and debridement of skin, subcutaneous tissue, tendon,     and periosteal tissue, this was an excisional debridement, left     index finger. 2. Wrist block anesthesia performed by myself with lidocaine,     Sensorcaine without epinephrine. 3. Repair of ulnar digital nerve with AxoGuard wrap 2-mm and direct     primary repair with 8-0 nylon. 4. Ulnar digital artery repair, left index finger.  SURGEON:  Dionne Ano. Amanda Pea, MD  ASSISTANT:  None.  COMPLICATION:  None.  ANESTHESIA:  Wrist block anesthesia with very light IV sedation keeping the patient awake, alert, and oriented, in the entire case.  TOURNIQUET TIME:  0.  INDICATIONS:  This patient is a 67 year old male who is somewhat unhealthy in my estimation.  He has multiple medical problems.  He had a laceration today.  We discussed with him risks and benefits of surgery. We discussed with Cardiology, his issues in Dr. Elvis Coil absence.  I felt that given all issues, it was best to attempt a local anesthetic instead of a general.  The patient expressed the desire to try to go home tonight and I feel this will be okay as long as we do not employ any general anesthetic, etc.  He is awake, alert, and oriented.  He understands risks and benefits and desires to proceed.  OPERATION IN DETAIL:  The patient was seen by myself and anesthesia, taken to the operative arena, underwent a very careful and  cautious approach to the wrist and underwent a wrist block anesthetic by myself with lidocaine, Sensorcaine without epinephrine.  Following this, he was prepped and draped in usual sterile fashion, Betadine scrub and paint.  Following this, we performed I and D of skin, subcutaneous tissue, periosteal tissue, and tendinous tissue.  This was an excisional debridement.  Three liters of fluid placed in the wound and there were no complicating features.  Curette scissor tip and knife were used for the debridement.  Following this, I made extensions of the wound, identified the ulnar digital nerve and artery, proximally and distally.  At this time, I established proximal and distal flow in the ulnar digital artery and then performed coaptation with Acklin clamps and then sutured this with a tenosuture.  Circumferential repair was accomplished.  The patient tolerated this well without difficulty and had ample flow and a good stovepipe look to the digital artery after the repair.  Following this, I then performed ulnar digital nerve repair, the nerve was coapted with 8-0 nylon sutures under microscopic vision.  The patient tolerated this well.  Following this, I then placed combination of 1.5 and 2.0 AxoGuard nerve wrap around  the nerve.  The patient tolerated this well.  There were no complicating features.  His flexor tendon apparatus was intact.  His extensor tendon although slightly encroached upon was intact and competent.  The wound was further irrigated and then closed with additional chromic suture.  The patient tolerated this well.  Following this, I very carefully cleansed the hand and placed the patient in a dorsal blocking splint to my satisfaction without difficulty.  The dorsal blocking splint was applied without difficulty and doing of course had been dressed sterilely prior to the final application of the splint.  The patient was stable throughout the course of the  operation.  He was awake, alert, and oriented, and had no issues.  I will allow him to be discharged tonight, I will see him back in the office in 12 days for suture removal, and a dorsal blocking splint. These notes have been discussed and all questions have been encouraged and answered.  Keflex x10 days 500 mg 1 p.o. q.i.d., and Norco p.r.n. extreme pain, otherwise, ibuprofen will be used.  These notes have been discussed and all questions have been encouraged and answered.     Dionne Ano. Amanda Pea, M.D.   ______________________________ Dionne Ano. Amanda Pea, M.D.    Walter French  D:  09/20/2013  T:  09/21/2013  Job:  [id number]  cc:   Peter M. [place], M.D.

## 2013-09-26 ENCOUNTER — Encounter (HOSPITAL_COMMUNITY): Payer: Self-pay | Admitting: Orthopedic Surgery

## 2013-09-29 ENCOUNTER — Encounter: Payer: Self-pay | Admitting: Internal Medicine

## 2013-09-30 ENCOUNTER — Ambulatory Visit (INDEPENDENT_AMBULATORY_CARE_PROVIDER_SITE_OTHER): Payer: Medicare Other | Admitting: *Deleted

## 2013-09-30 DIAGNOSIS — I472 Ventricular tachycardia: Secondary | ICD-10-CM

## 2013-09-30 DIAGNOSIS — Z95810 Presence of automatic (implantable) cardiac defibrillator: Secondary | ICD-10-CM

## 2013-09-30 DIAGNOSIS — I5042 Chronic combined systolic (congestive) and diastolic (congestive) heart failure: Secondary | ICD-10-CM

## 2013-09-30 DIAGNOSIS — I509 Heart failure, unspecified: Secondary | ICD-10-CM

## 2013-10-01 ENCOUNTER — Encounter (HOSPITAL_COMMUNITY): Payer: Self-pay | Admitting: Orthopedic Surgery

## 2013-10-04 ENCOUNTER — Other Ambulatory Visit: Payer: Self-pay | Admitting: *Deleted

## 2013-10-04 MED ORDER — METOPROLOL SUCCINATE ER 100 MG PO TB24: 100.0000 mg | ORAL_TABLET | Freq: Every day | ORAL | Status: DC

## 2013-10-04 MED ORDER — RANITIDINE HCL 300 MG PO TABS: 300.0000 mg | ORAL_TABLET | Freq: Every day | ORAL | Status: DC | PRN

## 2013-10-09 LAB — REMOTE ICD DEVICE
BATTERY VOLTAGE: 3.1355 V
BRDY-0002RV: 40 {beats}/min
DEV-0020ICD: NEGATIVE
FVT: 0
HV IMPEDENCE: 59 Ohm
RV LEAD IMPEDENCE ICD: 399 Ohm
RV LEAD THRESHOLD: 1.125 V
TOT-0001: 1
TZAT-0001FASTVT: 1
TZAT-0001SLOWVT: 1
TZAT-0001SLOWVT: 2
TZAT-0002FASTVT: NEGATIVE
TZAT-0004SLOWVT: 8
TZAT-0004SLOWVT: 8
TZAT-0005SLOWVT: 84 pct
TZAT-0005SLOWVT: 91 pct
TZAT-0012FASTVT: 200 ms
TZAT-0012SLOWVT: 200 ms
TZAT-0012SLOWVT: 200 ms
TZAT-0013SLOWVT: 2
TZAT-0013SLOWVT: 2
TZAT-0018FASTVT: NEGATIVE
TZAT-0018SLOWVT: NEGATIVE
TZAT-0018SLOWVT: NEGATIVE
TZAT-0019SLOWVT: 8 V
TZAT-0019SLOWVT: 8 V
TZAT-0020SLOWVT: 1.5 ms
TZAT-0020SLOWVT: 1.5 ms
TZON-0003VSLOWVT: 400 ms
TZON-0004SLOWVT: 32
TZON-0004VSLOWVT: 36
TZST-0001FASTVT: 4
TZST-0001FASTVT: 6
TZST-0001SLOWVT: 3
TZST-0001SLOWVT: 5
TZST-0002FASTVT: NEGATIVE
TZST-0002FASTVT: NEGATIVE
TZST-0002FASTVT: NEGATIVE
TZST-0003SLOWVT: 35 J
TZST-0003SLOWVT: 35 J
VF: 0

## 2013-10-11 ENCOUNTER — Ambulatory Visit (INDEPENDENT_AMBULATORY_CARE_PROVIDER_SITE_OTHER): Payer: Medicare Other | Admitting: Cardiology

## 2013-10-11 ENCOUNTER — Encounter: Payer: Self-pay | Admitting: Cardiology

## 2013-10-11 VITALS — BP 154/70 | HR 75 | Ht 72.0 in | Wt 259.0 lb

## 2013-10-11 DIAGNOSIS — I5042 Chronic combined systolic (congestive) and diastolic (congestive) heart failure: Secondary | ICD-10-CM

## 2013-10-11 DIAGNOSIS — G4733 Obstructive sleep apnea (adult) (pediatric): Secondary | ICD-10-CM

## 2013-10-11 DIAGNOSIS — Z95810 Presence of automatic (implantable) cardiac defibrillator: Secondary | ICD-10-CM

## 2013-10-11 DIAGNOSIS — I509 Heart failure, unspecified: Secondary | ICD-10-CM

## 2013-10-11 DIAGNOSIS — I739 Peripheral vascular disease, unspecified: Secondary | ICD-10-CM

## 2013-10-11 DIAGNOSIS — I472 Ventricular tachycardia: Secondary | ICD-10-CM

## 2013-10-11 DIAGNOSIS — I2510 Atherosclerotic heart disease of native coronary artery without angina pectoris: Secondary | ICD-10-CM

## 2013-10-11 NOTE — Patient Instructions (Signed)
Continue your current therapy  I will see you in 6 months.   

## 2013-10-11 NOTE — Progress Notes (Signed)
Walter French Date of Birth: 02/04/1946 Medical Record #[id number]  History of Present Illness: Walter French is seen today for a followup visit. He underwent complete cardiac evaluation in August 2013. This included an abnormal Myoview study with an ejection fraction of 32%. His cath showed severe 3VD, patent SVG to PD, patent SVG to PL, patent LIMA to LAD with a moderate severe stenosis at the anastomosis site and moderate LV dysfunction. He had normal right heart pressures. EF is 40 to 45%. He had previous attempt at intervention of the LIMA/LAD anastomosis with only partial success. The anterior apex is scarred. Medical therapy was recommended to be continued. The area of ischemia noted on the stress test correlates with the LCX occlusion. This is not amenable to revascularization.   On followup today he reports he is doing very well from a cardiac standpoint. He denies any symptoms of chest pain or shortness of breath. His claudication symptoms have improved since he underwent stenting of the right popliteal artery. He reports his blood sugars are doing better. He did have a recent laceration of his finger resulting in arterial and nerve damage. He did have surgery for this.  Current Outpatient Prescriptions on File Prior to Visit  Medication Sig Dispense Refill  . allopurinol (ZYLOPRIM) 300 MG tablet Take 300 mg by mouth daily.        [name] aspirin EC 81 MG tablet Take 1 tablet (81 mg total) by mouth daily.      [name] atorvastatin (LIPITOR) 40 MG tablet Take 40 mg by mouth daily.      . fenofibrate 160 MG tablet Take 160 mg by mouth daily.        [name] glipiZIDE (GLUCOTROL) 10 MG tablet Take 10 mg by mouth daily.       [name] HYDROcodone-acetaminophen (NORCO/VICODIN) 5-325 MG per tablet Take 2 tablets by mouth every 8 (eight) hours as needed. For pain      . insulin detemir (LEVEMIR) 100 UNIT/ML injection Inject 80 Units into the skin 2 (two) times daily.       . Liraglutide 18 MG/3ML SOPN Inject 1.2 mg into  the skin daily.      . metoprolol succinate (TOPROL-XL) 100 MG 24 hr tablet Take 1 tablet (100 mg total) by mouth daily. Take with or immediately following a meal.  30 tablet  1  . nitroGLYCERIN (NITROSTAT) 0.4 MG SL tablet Place 1 tablet (0.4 mg total) under the tongue every 5 (five) minutes x 3 doses as needed for chest pain.  25 tablet  3  . potassium chloride SA (K-DUR,KLOR-CON) 20 MEQ tablet Take 20 mEq by mouth daily.      . ranitidine (ZANTAC) 300 MG tablet Take 1 tablet (300 mg total) by mouth daily as needed for heartburn.  30 tablet  1  . valsartan (DIOVAN) 160 MG tablet Take 160 mg by mouth daily.       No current facility-administered medications on file prior to visit.    Allergies  Allergen Reactions  . Adhesive [Tape]     Pulls skin off  . Morphine And Related Nausea And Vomiting    Past Medical History  Diagnosis Date  . Hyperlipidemia   . Hypertension   . Coronary heart disease     a. s/p CABG x 3 (VG->PDA, VG->PL, LIMA->LAD);  b. 2013 Abnl Myoview;  c. 2013 Cath: 3/3 patent grafts, occluded LCX which correlated w/ ischemia on MV->not amenable to PCI->Med Rx.  . Obesity   .  CHF (congestive heart failure)   . VT (ventricular tachycardia)   . SVT (supraventricular tachycardia)   . PAD (peripheral artery disease)     Angiography in February of 2014: Moderate left iliac artery stenosis, mild to moderate diffuse right SFA disease. Severe proximal right popliteal artery stenosis with three-vessel runoff below the knee. Status post self-expanding stent placement to the proximal popliteal artery  . Automatic implantable cardioverter-defibrillator in situ   . Asthma     "small touch" (07/29/2013)  . History of pneumonia     "3-4 times; last time ~ 2003" (07/29/2013)  . OSA on CPAP   . Type II diabetes mellitus     on insulin  . GERD (gastroesophageal reflux disease)   . H/O hiatal hernia   . Gout   . Anxiety     Past Surgical History  Procedure Laterality Date  .  Tonsillectomy    . Cardiac catheterization    . Wrist fracture surgery Right   . Cataract extraction w/ intraocular lens implant Left   . Cardiac defibrillator placement  1997; ~ 2003  . Coronary artery bypass graft  1997; 2007    "X3" (07/29/2013)  . Nerve, tendon and artery repair Left 09/20/2013    Procedure: IRRIGATION AND DEBRIDEMENT,Exploration and Repair of Ulnar/Digital  Artery Nerve of Left Index finger;  Surgeon: Walter Severin, MD;  Location: [facility] OR;  Service: Orthopedics;  Laterality: Left;    History  Smoking status  . Former Smoker -- 2.00 packs/day for 30 years  . Types: Cigarettes  . Quit date: 12/13/1995  Smokeless tobacco  . Never Used    History  Alcohol Use No    Comment: 07/29/2013 "not touched any alcohol since 1997"    Family History  Problem Relation Age of Onset  . Heart disease Brother   . Cancer Brother   . Breast cancer Mother     Review of Systems: The review of systems is per the HPI.  All other systems were reviewed and are negative.  Physical Exam: BP 154/70  Pulse 75  Ht 6' (1.829 m)  Wt 259 lb (117.482 kg)  BMI 35.12 kg/m2 Patient is very pleasant and in no acute distress. He is obese. Skin is warm and dry. Color is normal.  HEENT is unremarkable. Normocephalic/atraumatic. PERRL. Sclera are nonicteric. Neck is supple. No masses. No JVD. Lungs are clear. Cardiac exam shows a regular rate and rhythm. Abdomen is obese but soft. Extremities are without edema. Gait and ROM are intact. No gross neurologic deficits noted.   LABORATORY DATA:  Lab Results  Component Value Date   WBC 7.5 09/20/2013   HGB 15.3 09/20/2013   HCT 45.0 09/20/2013   PLT 156 09/20/2013   GLUCOSE 222* 09/20/2013   CHOL 138 07/30/2013   TRIG 321* 07/30/2013   HDL 25* 07/30/2013   LDLCALC 49 07/30/2013   ALT 13 07/29/2013   AST 30 07/29/2013   NA 140 09/20/2013   K 4.3 09/20/2013   CL 104 09/20/2013   CREATININE 1.30 09/20/2013   BUN 15 09/20/2013   CO2 23  07/30/2013   TSH 2.055 07/29/2013   INR 1.00 07/29/2013   HGBA1C 6.7* 07/29/2013     Assessment / Plan:  1. Coronary disease status post CABG - with findings in August 2013 as noted above. He is to continue with his medical regimen. He is doing well. Tolerating his medicines.   2. HTN - BP is ok. No change in his current regimen.  3. Congestive heart failure with ischemic cardiomyopathy. He appears to be well compensated.  4. PAD with significant right calf claudication. Markedly improved following stenting of the right popliteal artery.  5. History of ventricular tachycardia status post ICD  implant. Continue followup in the device clinic.  6. Diabetes mellitus on insulin.

## 2013-10-18 ENCOUNTER — Encounter: Payer: Self-pay | Admitting: *Deleted

## 2013-11-20 ENCOUNTER — Telehealth: Payer: Self-pay | Admitting: Cardiology

## 2013-11-20 NOTE — Telephone Encounter (Signed)
Follow Up ° °Pt returned call//  °

## 2013-11-20 NOTE — Telephone Encounter (Signed)
lmtcb

## 2013-11-21 NOTE — Telephone Encounter (Signed)
Returned call to [employer] patient's friend she stated someone already called and changed patient's appointment, everything taken care of.

## 2013-12-16 ENCOUNTER — Other Ambulatory Visit: Payer: Self-pay

## 2013-12-16 MED ORDER — POTASSIUM CHLORIDE CRYS ER 20 MEQ PO TBCR: 20.0000 meq | EXTENDED_RELEASE_TABLET | Freq: Every day | ORAL | Status: DC

## 2013-12-16 MED ORDER — RANITIDINE HCL 300 MG PO TABS: 300.0000 mg | ORAL_TABLET | Freq: Every day | ORAL | Status: DC | PRN

## 2013-12-16 MED ORDER — METOPROLOL SUCCINATE ER 100 MG PO TB24: 100.0000 mg | ORAL_TABLET | Freq: Every day | ORAL | Status: DC

## 2013-12-17 ENCOUNTER — Encounter: Payer: Self-pay | Admitting: Internal Medicine

## 2013-12-17 ENCOUNTER — Ambulatory Visit (INDEPENDENT_AMBULATORY_CARE_PROVIDER_SITE_OTHER): Payer: Medicare Other | Admitting: Internal Medicine

## 2013-12-17 VITALS — BP 141/87 | HR 87 | Ht 73.0 in | Wt 261.8 lb

## 2013-12-17 DIAGNOSIS — I472 Ventricular tachycardia, unspecified: Secondary | ICD-10-CM

## 2013-12-17 DIAGNOSIS — I5042 Chronic combined systolic (congestive) and diastolic (congestive) heart failure: Secondary | ICD-10-CM

## 2013-12-17 DIAGNOSIS — Z95810 Presence of automatic (implantable) cardiac defibrillator: Secondary | ICD-10-CM

## 2013-12-17 DIAGNOSIS — I509 Heart failure, unspecified: Secondary | ICD-10-CM

## 2013-12-17 DIAGNOSIS — I4729 Other ventricular tachycardia: Secondary | ICD-10-CM

## 2013-12-17 LAB — MDC_IDC_ENUM_SESS_TYPE_INCLINIC
Battery Voltage: 3.1 V
Brady Statistic RV Percent Paced: 0.01 %
Date Time Interrogation Session: 20150106145757
HIGH POWER IMPEDANCE MEASURED VALUE: 380 Ohm
HighPow Impedance: 133 Ohm
HighPow Impedance: 68 Ohm
Lead Channel Sensing Intrinsic Amplitude: 31.625 mV
Lead Channel Setting Pacing Amplitude: 2.5 V
Lead Channel Setting Pacing Pulse Width: 0.4 ms
Lead Channel Setting Sensing Sensitivity: 0.3 mV
MDC IDC MSMT LEADCHNL RV IMPEDANCE VALUE: 437 Ohm
MDC IDC MSMT LEADCHNL RV PACING THRESHOLD AMPLITUDE: 1 V
MDC IDC MSMT LEADCHNL RV PACING THRESHOLD PULSEWIDTH: 0.4 ms
Zone Setting Detection Interval: 280 ms
Zone Setting Detection Interval: 360 ms
Zone Setting Detection Interval: 400 ms

## 2013-12-17 NOTE — Patient Instructions (Signed)
Your physician recommends that you continue on your current medications as directed. Please refer to the Current Medication list given to you today.  Remote monitoring is used to monitor your Pacemaker of ICD from home. This monitoring reduces the number of office visits required to check your device to one time per year. It allows us to keep an eye on the functioning of your device to ensure it is working properly. You are scheduled for a device check from home on 03/20/2014. You may send your transmission at any time that day. If you have a wireless device, the transmission will be sent automatically. After your physician reviews your transmission, you will receive a postcard with your next transmission date.  Your physician wants you to follow-up in: 1 year with Dr. Klein.  You will receive a reminder letter in the mail two months in advance. If you don't receive a letter, please call our office to schedule the follow-up appointment.     

## 2013-12-17 NOTE — Progress Notes (Signed)
To be moving he     Patient Care Team: Karlene Einstein as PCP - General (Family Medicine)   HPI  Walter French is a 68 y.o. 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 He is status post ICD implantation originally implanted in 1997 and changed out subsequently  He underwent generator replacement aug 2012.   He underwent redo CABG in 2007 and then subsequent angioplasty of the IMA anastomosis to the LAD in 2008. Ejection fraction at that time was 50%.   Because of symptoms of chest pain and shortness of breath>> Myoview scanning which was abnormal and subsequently underwent catheterization. He had patent SVG to PD, patent SVG to PL, patent LIMA to LAD with a moderate severe stenosis at the anastomosis site and moderate LV dysfunction. Medical therapy was recommended      Past Medical History  Diagnosis Date  . Hyperlipidemia   . Hypertension   . Coronary heart disease     a. s/p CABG x 3 (VG->PDA, VG->PL, LIMA->LAD);  b. 2013 Abnl Myoview;  c. 2013 Cath: 3/3 patent grafts, occluded LCX which correlated w/ ischemia on MV->not amenable to PCI->Med Rx.  . Obesity   . CHF (congestive heart failure)   . VT (ventricular tachycardia)   . SVT (supraventricular tachycardia)   . PAD (peripheral artery disease)     Angiography in February of 2014: Moderate left iliac artery stenosis, mild to moderate diffuse right SFA disease. Severe proximal right popliteal artery stenosis with three-vessel runoff below the knee. Status post self-expanding stent placement to the proximal popliteal artery  . Implantable cardioverter-defibrillator Mdt   . Asthma     "small touch" (07/29/2013)  . History of pneumonia     "3-4 times; last time ~ 2003" (07/29/2013)  . OSA on CPAP   . Type II diabetes mellitus     on insulin  . GERD (gastroesophageal reflux  disease)   . H/O hiatal hernia   . Gout   . Anxiety     Past Surgical History  Procedure Laterality Date  . Tonsillectomy    . Cardiac catheterization    . Wrist fracture surgery Right   . Cataract extraction w/ intraocular lens implant Left   . Cardiac defibrillator placement  1997; ~ 2003  . Coronary artery bypass graft  1997; 2007    "X3" (07/29/2013)  . Nerve, tendon and artery repair Left 09/20/2013    Procedure: IRRIGATION AND DEBRIDEMENT,Exploration and Repair of Ulnar/Digital  Artery Nerve of Left Index finger;  Surgeon: Roseanne Kaufman, MD;  Location: [facility];  Service: Orthopedics;  Laterality: Left;    Current Outpatient Prescriptions  Medication Sig Dispense Refill  . allopurinol (ZYLOPRIM) 300 MG tablet Take 300 mg by mouth daily.        [name] aspirin EC 81 MG tablet Take 1 tablet (81 mg total) by mouth daily.      [name] atorvastatin (LIPITOR) 40 MG tablet Take 40 mg by mouth daily.      [name] glipiZIDE (GLUCOTROL) 10 MG tablet Take 10 mg by mouth daily.       [name] HYDROcodone-acetaminophen (NORCO/VICODIN) 5-325 MG per tablet Take 2 tablets by mouth every 8 (eight) hours as needed. For pain      . insulin detemir (LEVEMIR) 100 UNIT/ML injection Inject 80 Units into the skin 2 (two) times daily.       . Liraglutide 18 MG/3ML  SOPN Inject 1.2 mg into the skin daily.      . metoprolol succinate (TOPROL-XL) 100 MG 24 hr tablet Take 1 tablet (100 mg total) by mouth daily. Take with or immediately following a meal.  30 tablet  6  . nitroGLYCERIN (NITROSTAT) 0.4 MG SL tablet Place 1 tablet (0.4 mg total) under the tongue every 5 (five) minutes x 3 doses as needed for chest pain.  25 tablet  3  . NOVOTWIST 32G X 5 MM MISC As directed      . potassium chloride SA (K-DUR,KLOR-CON) 20 MEQ tablet Take 1 tablet (20 mEq total) by mouth daily.  30 tablet  6  . ranitidine (ZANTAC) 300 MG tablet Take 1 tablet (300 mg total) by mouth daily as needed for heartburn.  30 tablet  3  . valsartan (DIOVAN) 160 MG  tablet Take 160 mg by mouth daily.      . fenofibrate 160 MG tablet Take 160 mg by mouth daily.        No current facility-administered medications for this visit.    Allergies  Allergen Reactions  . Adhesive [Tape]     Pulls skin off  . Morphine And Related Nausea And Vomiting    Review of Systems negative except from HPI and PMH  Physical Exam BP 141/87  Pulse 87  Ht 6\' 1"  (1.854 m)  Wt 261 lb 12.8 oz (118.752 kg)  BMI 34.55 kg/m2 Well developed and well nourished in no acute distress HENT normal E scleral and icterus clear Neck Supple JVP flat; carotids brisk and full Clear to ausculation  Regular rate and rhythm, no murmurs gallops or rub Soft with active bowel sounds No clubbing cyanosis no  Edema Alert and oriented, grossly normal motor and sensory function Skin Warm and Dry    Assessment and  Plan

## 2014-01-09 ENCOUNTER — Other Ambulatory Visit: Payer: Self-pay | Admitting: *Deleted

## 2014-01-09 MED ORDER — ATORVASTATIN CALCIUM 40 MG PO TABS: 40.0000 mg | ORAL_TABLET | Freq: Every day | ORAL | Status: DC

## 2014-01-10 ENCOUNTER — Encounter: Payer: Self-pay | Admitting: Nurse Practitioner

## 2014-01-10 ENCOUNTER — Telehealth: Payer: Self-pay | Admitting: *Deleted

## 2014-01-10 ENCOUNTER — Ambulatory Visit (INDEPENDENT_AMBULATORY_CARE_PROVIDER_SITE_OTHER): Payer: Medicare Other | Admitting: Nurse Practitioner

## 2014-01-10 ENCOUNTER — Other Ambulatory Visit: Payer: Self-pay | Admitting: *Deleted

## 2014-01-10 ENCOUNTER — Ambulatory Visit: Payer: Medicare Other | Admitting: Cardiology

## 2014-01-10 VITALS — BP 180/90 | HR 78 | Ht 73.2 in | Wt 258.0 lb

## 2014-01-10 DIAGNOSIS — I739 Peripheral vascular disease, unspecified: Secondary | ICD-10-CM

## 2014-01-10 MED ORDER — VALSARTAN 160 MG PO TABS: 160.0000 mg | ORAL_TABLET | Freq: Every day | ORAL | Status: DC

## 2014-01-10 NOTE — Progress Notes (Signed)
Margorie French Date of Birth: [birth year] Medical Record #[id number]  History of Present Illness: [name] is seen back today for a follow up visit. Seen for Dr. Martinique. He has known CAD last cath in 2013 with right and left heart cath due to increasing dyspnea and an abnormal Lexiscan. His cath showed severe 3VD, patent SVG to PD, patent SVG to PL, patent LIMA to LAD with a moderate severe stenosis at the anastomosis site and moderate LV dysfunction. He had normal right heart pressures. EF was 40 to 45%. Medical therapy was recommended to be continued at that time. The area of ischemia noted on the stress test correlated with the LCX occlusion. This is not amenable to revascularization. While there is a moderately severe stenosis at the anastomotic site of the IMA graft to the LAD, there was no ischemia noted in this terrority on the Myoview. The apex was scar. He has had prior attempted angioplasty of this lesion with moderate success but it was an extremely difficult procedure. It was Dr. Doug Sou opinion to continue medical therapy.   His other issues include HLD, gout, HTN, VT with ICD in place, OSA, DM and obesity. He has had angiography and stent to the right popliteal artery with past resolution of his claudication.   Last seen by Dr. Martinique in October and seemed to be doing ok.   Was here earlier this month to see Dr. Caryl Comes for his device check.   Comes back today. Here alone. Out of his medicines for the past 2 days- not sure what he is out of. Dropped off 4 bottles to the drug store. He is feeling ok. No chest pain. Not short of breath. Some burning in the legs again when he walks. For fasting labs with his PCP next week. He is not fasting today. 57 "old age is getting to him".   Current Outpatient Prescriptions  Medication Sig Dispense Refill  . allopurinol (ZYLOPRIM) 300 MG tablet Take 300 mg by mouth daily.        [name] aspirin EC 81 MG tablet Take 1 tablet (81 mg total) by mouth  daily.      [name] atorvastatin (LIPITOR) 40 MG tablet Take 1 tablet (40 mg total) by mouth daily.  30 tablet  3  . fenofibrate 160 MG tablet Take 160 mg by mouth daily.       [name] glipiZIDE (GLUCOTROL) 10 MG tablet Take 10 mg by mouth daily.       [name] HYDROcodone-acetaminophen (NORCO/VICODIN) 5-325 MG per tablet Take 2 tablets by mouth every 8 (eight) hours as needed. For pain      . insulin detemir (LEVEMIR) 100 UNIT/ML injection Inject 80 Units into the skin 2 (two) times daily.       . Liraglutide 18 MG/3ML SOPN Inject 1.2 mg into the skin daily.      . metoprolol succinate (TOPROL-XL) 100 MG 24 hr tablet Take 1 tablet (100 mg total) by mouth daily. Take with or immediately following a meal.  30 tablet  6  . nitroGLYCERIN (NITROSTAT) 0.4 MG SL tablet Place 1 tablet (0.4 mg total) under the tongue every 5 (five) minutes x 3 doses as needed for chest pain.  25 tablet  3  . NOVOTWIST 32G X 5 MM MISC As directed      . potassium chloride SA (K-DUR,KLOR-CON) 20 MEQ tablet Take 1 tablet (20 mEq total) by mouth daily.  30 tablet  6  . ranitidine (ZANTAC) 300 MG  tablet Take 1 tablet (300 mg total) by mouth daily as needed for heartburn.  30 tablet  3  . valsartan (DIOVAN) 160 MG tablet Take 160 mg by mouth daily.       No current facility-administered medications for this visit.    Allergies  Allergen Reactions  . Adhesive [Tape]     Pulls skin off  . Morphine And Related Nausea And Vomiting    Past Medical History  Diagnosis Date  . Hyperlipidemia   . Hypertension   . Coronary heart disease     a. s/p CABG x 3 (VG->PDA, VG->PL, LIMA->LAD);  b. 2013 Abnl Myoview;  c. 2013 Cath: 3/3 patent grafts, occluded LCX which correlated w/ ischemia on MV->not amenable to PCI->Med Rx.  . Obesity   . CHF (congestive heart failure)   . VT (ventricular tachycardia)   . SVT (supraventricular tachycardia)   . PAD (peripheral artery disease)     Angiography in February of 2014: Moderate left iliac artery  stenosis, mild to moderate diffuse right SFA disease. Severe proximal right popliteal artery stenosis with three-vessel runoff below the knee. Status post self-expanding stent placement to the proximal popliteal artery  . Implantable cardioverter-defibrillator Mdt   . Asthma     "small touch" (07/29/2013)  . History of pneumonia     "3-4 times; last time ~ 2003" (07/29/2013)  . OSA on CPAP   . Type II diabetes mellitus     on insulin  . GERD (gastroesophageal reflux disease)   . H/O hiatal hernia   . Gout   . Anxiety     Past Surgical History  Procedure Laterality Date  . Tonsillectomy    . Cardiac catheterization    . Wrist fracture surgery Right   . Cataract extraction w/ intraocular lens implant Left   . Cardiac defibrillator placement  1997; ~ 2003  . Coronary artery bypass graft  1997; 2007    "X3" (07/29/2013)  . Nerve, tendon and artery repair Left 09/20/2013    Procedure: IRRIGATION AND DEBRIDEMENT,Exploration and Repair of Ulnar/Digital  Artery Nerve of Left Index finger;  Surgeon: Roseanne Kaufman, MD;  Location: [facility];  Service: Orthopedics;  Laterality: Left;    History  Smoking status  . Former Smoker -- 2.00 packs/day for 30 years  . Types: Cigarettes  . Quit date: 12/13/1995  Smokeless tobacco  . Never Used    History  Alcohol Use No    Comment: 07/29/2013 "not touched any alcohol since 1997"    Family History  Problem Relation Age of Onset  . Heart disease Brother   . Cancer Brother   . Breast cancer Mother     Review of Systems: The review of systems is per the HPI.  All other systems were reviewed and are negative.  Physical Exam: BP 180/90  Pulse 78  Ht 6' 1.2" (1.859 m)  Wt 258 lb (117.028 kg)  BMI 33.86 kg/m2  SpO2 96% Patient is very pleasant and in no acute distress. Skin is warm and dry. Color is normal.  HEENT is unremarkable. Normocephalic/atraumatic. PERRL. Sclera are nonicteric. Neck is supple. No masses. No JVD. Lungs are clear.  Cardiac exam shows a regular rate and rhythm. Abdomen is soft. Extremities are without edema. Gait and ROM are intact. No gross neurologic deficits noted.  LABORATORY DATA: Lab Results  Component Value Date   WBC 7.5 09/20/2013   HGB 15.3 09/20/2013   HCT 45.0 09/20/2013   PLT 156 09/20/2013   GLUCOSE  222* 09/20/2013   CHOL 138 07/30/2013   TRIG 321* 07/30/2013   HDL 25* 07/30/2013   LDLCALC 49 07/30/2013   ALT 13 07/29/2013   AST 30 07/29/2013   NA 140 09/20/2013   K 4.3 09/20/2013   CL 104 09/20/2013   CREATININE 1.30 09/20/2013   BUN 15 09/20/2013   CO2 23 07/30/2013   TSH 2.055 07/29/2013   INR 1.00 07/29/2013   HGBA1C 6.7* 07/29/2013     Assessment / Plan: 1. CAD - will continue with medical management - he is without symptoms.   2. HTN - blood pressure up - he is out of his medicines - have clarified this with the drug store. Diovan refilled.   3. Ischemic CM   4. PAD - with complaint of leg pain/claudication - update his ABI  5. VT with underlying ICD - followed by Dr. Caryl Comes with recent check.    6. DM   See Dr. Martinique in 4 months.   Patient is agreeable to this plan and will call if any problems develop in the interim.   Burtis Junes, RN, [place] [address] [place], [place]  [id number] [phone]

## 2014-01-10 NOTE — Patient Instructions (Addendum)
We need to check the blood flow in your legs (arterial dopplers)  We will call the drug store and get your medicines refilled - you need to go and pick up your Diovan and your Lipitor and get started back on these medicines.  See Dr.Jordan in 4 months  Try to be as active as possible  Call the [facility] office at [phone] if you have any questions, problems or concerns.

## 2014-01-10 NOTE — Telephone Encounter (Signed)
S/w Mindy, pharmacist at [facility] on [address] @ [phone]  pt stated had four medications to be refilled according to the pharmacist pt only had one to pick up, atorvastatin and needed Diovan refilled which I did.  Pt is aware and will pick up medications

## 2014-01-14 ENCOUNTER — Ambulatory Visit (HOSPITAL_COMMUNITY): Payer: Medicare Other | Attending: Nurse Practitioner

## 2014-01-14 ENCOUNTER — Other Ambulatory Visit (HOSPITAL_COMMUNITY): Payer: Self-pay | Admitting: Cardiology

## 2014-01-14 DIAGNOSIS — I2510 Atherosclerotic heart disease of native coronary artery without angina pectoris: Secondary | ICD-10-CM | POA: Insufficient documentation

## 2014-01-14 DIAGNOSIS — E785 Hyperlipidemia, unspecified: Secondary | ICD-10-CM | POA: Insufficient documentation

## 2014-01-14 DIAGNOSIS — I739 Peripheral vascular disease, unspecified: Secondary | ICD-10-CM

## 2014-01-14 DIAGNOSIS — I10 Essential (primary) hypertension: Secondary | ICD-10-CM | POA: Insufficient documentation

## 2014-01-14 DIAGNOSIS — Z87891 Personal history of nicotine dependence: Secondary | ICD-10-CM | POA: Insufficient documentation

## 2014-01-14 DIAGNOSIS — E119 Type 2 diabetes mellitus without complications: Secondary | ICD-10-CM | POA: Insufficient documentation

## 2014-03-20 ENCOUNTER — Ambulatory Visit (INDEPENDENT_AMBULATORY_CARE_PROVIDER_SITE_OTHER): Payer: Medicare Other | Admitting: *Deleted

## 2014-03-20 DIAGNOSIS — I509 Heart failure, unspecified: Secondary | ICD-10-CM

## 2014-03-20 DIAGNOSIS — I5042 Chronic combined systolic (congestive) and diastolic (congestive) heart failure: Secondary | ICD-10-CM

## 2014-03-20 DIAGNOSIS — Z95810 Presence of automatic (implantable) cardiac defibrillator: Secondary | ICD-10-CM

## 2014-03-25 LAB — MDC_IDC_ENUM_SESS_TYPE_REMOTE
Battery Voltage: 3.12 V
Brady Statistic RV Percent Paced: 0.01 %
Date Time Interrogation Session: 20150409213527
HIGH POWER IMPEDANCE MEASURED VALUE: 171 Ohm
HIGH POWER IMPEDANCE MEASURED VALUE: 399 Ohm
HIGH POWER IMPEDANCE MEASURED VALUE: 68 Ohm
Lead Channel Pacing Threshold Amplitude: 0.875 V
Lead Channel Sensing Intrinsic Amplitude: 28.75 mV
Lead Channel Sensing Intrinsic Amplitude: 28.75 mV
Lead Channel Setting Pacing Amplitude: 2.5 V
Lead Channel Setting Pacing Pulse Width: 0.4 ms
Lead Channel Setting Sensing Sensitivity: 0.3 mV
MDC IDC MSMT LEADCHNL RV IMPEDANCE VALUE: 456 Ohm
MDC IDC MSMT LEADCHNL RV PACING THRESHOLD PULSEWIDTH: 0.4 ms
MDC IDC SET ZONE DETECTION INTERVAL: 360 ms
MDC IDC SET ZONE DETECTION INTERVAL: 400 ms
Zone Setting Detection Interval: 280 ms

## 2014-04-10 ENCOUNTER — Encounter: Payer: Self-pay | Admitting: *Deleted

## 2014-04-22 ENCOUNTER — Other Ambulatory Visit: Payer: Self-pay

## 2014-04-22 MED ORDER — RANITIDINE HCL 300 MG PO TABS: 300.0000 mg | ORAL_TABLET | Freq: Every day | ORAL | Status: DC | PRN

## 2014-04-24 ENCOUNTER — Encounter: Payer: Self-pay | Admitting: Cardiology

## 2014-04-24 ENCOUNTER — Ambulatory Visit (INDEPENDENT_AMBULATORY_CARE_PROVIDER_SITE_OTHER): Payer: Medicare Other | Admitting: Cardiology

## 2014-04-24 VITALS — BP 118/74 | HR 74 | Ht 72.0 in | Wt 256.4 lb

## 2014-04-24 DIAGNOSIS — G4733 Obstructive sleep apnea (adult) (pediatric): Secondary | ICD-10-CM

## 2014-04-24 DIAGNOSIS — Z95810 Presence of automatic (implantable) cardiac defibrillator: Secondary | ICD-10-CM

## 2014-04-24 DIAGNOSIS — I509 Heart failure, unspecified: Secondary | ICD-10-CM

## 2014-04-24 DIAGNOSIS — I2510 Atherosclerotic heart disease of native coronary artery without angina pectoris: Secondary | ICD-10-CM

## 2014-04-24 DIAGNOSIS — I5042 Chronic combined systolic (congestive) and diastolic (congestive) heart failure: Secondary | ICD-10-CM

## 2014-04-24 DIAGNOSIS — I472 Ventricular tachycardia, unspecified: Secondary | ICD-10-CM

## 2014-04-24 DIAGNOSIS — I739 Peripheral vascular disease, unspecified: Secondary | ICD-10-CM

## 2014-04-24 DIAGNOSIS — I4729 Other ventricular tachycardia: Secondary | ICD-10-CM

## 2014-04-24 NOTE — Progress Notes (Signed)
Walter French Date of Birth: [birth year] Medical Record #[id number]  History of Present Illness: Walter French is seen back today for a follow up visit. He has known CAD with last cath in 2013 with right and left heart cath due to increasing dyspnea and an abnormal Lexiscan. His cath showed severe 3VD, patent SVG to PD, patent SVG to PL, patent LIMA to LAD with a moderate severe stenosis at the anastomosis site and moderate LV dysfunction. He had normal right heart pressures. EF was 40 to 45%. Medical therapy was recommended to be continued at that time. The area of ischemia noted on the stress test correlated with the LCX occlusion. This is not amenable to revascularization. While there is a moderately severe stenosis at the anastomotic site of the IMA graft to the LAD, there was no ischemia noted in this terrority on the Myoview. The apex was scar. He has had prior attempted angioplasty of this lesion with moderate success but it was an extremely difficult procedure. It was Dr. Doug Sou opinion to continue medical therapy.  On follow up today he continues to complain of dyspnea on exertion and his legs get very weak with walking. He does have OSA but does not use his CPAP regularly. PFTs in 10/12 showed no obstruction but moderate reduction in diffusion capacity. Myoview in 8/13 showed apical scar and lateral ischemia. Recent ICD check in April showed a 7 beat run NSVT. optivol was stable.   His other issues include HLD, gout, HTN, VT with ICD in place, OSA, DM and obesity. He has had angiography and stent to the right popliteal artery with past resolution of his claudication.    Current Outpatient Prescriptions  Medication Sig Dispense Refill  . allopurinol (ZYLOPRIM) 300 MG tablet Take 300 mg by mouth daily.        [name] aspirin EC 81 MG tablet Take 1 tablet (81 mg total) by mouth daily.      [name] atorvastatin (LIPITOR) 40 MG tablet Take 1 tablet (40 mg total) by mouth daily.  30 tablet  3  .  fenofibrate 160 MG tablet Take 160 mg by mouth daily.       [name] glipiZIDE (GLUCOTROL) 10 MG tablet Take 10 mg by mouth daily.       [name] HYDROcodone-acetaminophen (NORCO/VICODIN) 5-325 MG per tablet Take 2 tablets by mouth every 8 (eight) hours as needed. For pain      . insulin detemir (LEVEMIR) 100 UNIT/ML injection Inject 80 Units into the skin 2 (two) times daily.       . Liraglutide 18 MG/3ML SOPN Inject 1.2 mg into the skin daily.      . metoprolol succinate (TOPROL-XL) 100 MG 24 hr tablet Take 1 tablet (100 mg total) by mouth daily. Take with or immediately following a meal.  30 tablet  6  . nitroGLYCERIN (NITROSTAT) 0.4 MG SL tablet Place 1 tablet (0.4 mg total) under the tongue every 5 (five) minutes x 3 doses as needed for chest pain.  25 tablet  3  . NOVOTWIST 32G X 5 MM MISC As directed      . potassium chloride SA (K-DUR,KLOR-CON) 20 MEQ tablet Take 1 tablet (20 mEq total) by mouth daily.  30 tablet  6  . ranitidine (ZANTAC) 300 MG tablet Take 1 tablet (300 mg total) by mouth daily as needed for heartburn.  30 tablet  3  . valsartan (DIOVAN) 160 MG tablet Take 1 tablet (160 mg total) by mouth daily.  30 tablet  11   No current facility-administered medications for this visit.    Allergies  Allergen Reactions  . Adhesive [Tape]     Pulls skin off  . Morphine And Related Nausea And Vomiting    Past Medical History  Diagnosis Date  . Hyperlipidemia   . Hypertension   . Coronary heart disease     a. s/p CABG x 3 (VG->PDA, VG->PL, LIMA->LAD);  b. 2013 Abnl Myoview;  c. 2013 Cath: 3/3 patent grafts, occluded LCX which correlated w/ ischemia on MV->not amenable to PCI->Med Rx.  . Obesity   . CHF (congestive heart failure)   . VT (ventricular tachycardia)   . SVT (supraventricular tachycardia)   . PAD (peripheral artery disease)     Angiography in February of 2014: Moderate left iliac artery stenosis, mild to moderate diffuse right SFA disease. Severe proximal right popliteal artery  stenosis with three-vessel runoff below the knee. Status post self-expanding stent placement to the proximal popliteal artery  . Implantable cardioverter-defibrillator Mdt   . Asthma     "small touch" (07/29/2013)  . History of pneumonia     "3-4 times; last time ~ 2003" (07/29/2013)  . OSA on CPAP   . Type II diabetes mellitus     on insulin  . GERD (gastroesophageal reflux disease)   . H/O hiatal hernia   . Gout   . Anxiety     Past Surgical History  Procedure Laterality Date  . Tonsillectomy    . Cardiac catheterization    . Wrist fracture surgery Right   . Cataract extraction w/ intraocular lens implant Left   . Cardiac defibrillator placement  1997; ~ 2003  . Coronary artery bypass graft  1997; 2007    "X3" (07/29/2013)  . Nerve, tendon and artery repair Left 09/20/2013    Procedure: IRRIGATION AND DEBRIDEMENT,Exploration and Repair of Ulnar/Digital  Artery Nerve of Left Index finger;  Surgeon: Roseanne Kaufman, MD;  Location: [facility];  Service: Orthopedics;  Laterality: Left;    History  Smoking status  . Former Smoker -- 2.00 packs/day for 30 years  . Types: Cigarettes  . Quit date: 12/13/1995  Smokeless tobacco  . Never Used    History  Alcohol Use No    Comment: 07/29/2013 "not touched any alcohol since 1997"    Family History  Problem Relation Age of Onset  . Heart disease Brother   . Cancer Brother   . Breast cancer Mother     Review of Systems: The review of systems is per the HPI.  All other systems were reviewed and are negative.  Physical Exam: BP 118/74  Pulse 74  Ht 6' (1.829 m)  Wt 256 lb 6 oz (116.291 kg)  BMI 34.76 kg/m2 Patient is very pleasant and in no acute distress. Skin is warm and dry. Color is normal.  HEENT is unremarkable. Normocephalic/atraumatic. PERRL. Sclera are nonicteric. Neck is supple. No masses. No JVD. Lungs are clear. Cardiac exam shows a regular rate and rhythm. Abdomen is soft. Extremities are without edema. Gait and ROM are  intact. No gross neurologic deficits noted.  LABORATORY DATA: Lab Results  Component Value Date   WBC 7.5 09/20/2013   HGB 15.3 09/20/2013   HCT 45.0 09/20/2013   PLT 156 09/20/2013   GLUCOSE 222* 09/20/2013   CHOL 138 07/30/2013   TRIG 321* 07/30/2013   HDL 25* 07/30/2013   LDLCALC 49 07/30/2013   ALT 13 07/29/2013   AST 30 07/29/2013  NA 140 09/20/2013   K 4.3 09/20/2013   CL 104 09/20/2013   CREATININE 1.30 09/20/2013   BUN 15 09/20/2013   CO2 23 07/30/2013   TSH 2.055 07/29/2013   INR 1.00 07/29/2013   HGBA1C 6.7* 07/29/2013     Assessment / Plan: 1. CAD - will continue with medical management. See note above.   2. HTN - blood pressure is controlled.  3. Ischemic CM   4. PAD - with complaint of leg pain/claudication - last ABI was satisfactory with patent stent.   5. VT with underlying ICD - followed by Dr. Caryl Comes with recent check.    6. DM   7. Chronic dyspnea- multifactorial. On appropriate cardiac therapy. Encourage increased aerobic activity.

## 2014-04-24 NOTE — Patient Instructions (Signed)
Continue your current therapy  I will see you in 6 months.   

## 2014-04-25 ENCOUNTER — Encounter: Payer: Self-pay | Admitting: Internal Medicine

## 2014-05-12 ENCOUNTER — Other Ambulatory Visit: Payer: Self-pay | Admitting: *Deleted

## 2014-05-12 MED ORDER — ATORVASTATIN CALCIUM 40 MG PO TABS: 40.0000 mg | ORAL_TABLET | Freq: Every day | ORAL | Status: DC

## 2014-06-18 ENCOUNTER — Telehealth: Payer: Self-pay | Admitting: Cardiology

## 2014-06-18 NOTE — Telephone Encounter (Signed)
Informed pt that due to updates we need him to send manual transmission. Pt verbalized understanding and said he would send transmission on Saturday.

## 2014-06-19 DIAGNOSIS — I4729 Other ventricular tachycardia: Secondary | ICD-10-CM

## 2014-06-19 DIAGNOSIS — I5042 Chronic combined systolic (congestive) and diastolic (congestive) heart failure: Secondary | ICD-10-CM

## 2014-06-19 DIAGNOSIS — I472 Ventricular tachycardia: Secondary | ICD-10-CM

## 2014-06-19 DIAGNOSIS — I509 Heart failure, unspecified: Secondary | ICD-10-CM

## 2014-06-23 ENCOUNTER — Ambulatory Visit (INDEPENDENT_AMBULATORY_CARE_PROVIDER_SITE_OTHER): Payer: Medicare Other | Admitting: *Deleted

## 2014-06-23 DIAGNOSIS — I472 Ventricular tachycardia: Secondary | ICD-10-CM

## 2014-06-23 DIAGNOSIS — I5042 Chronic combined systolic (congestive) and diastolic (congestive) heart failure: Secondary | ICD-10-CM

## 2014-06-23 DIAGNOSIS — I4729 Other ventricular tachycardia: Secondary | ICD-10-CM

## 2014-06-24 ENCOUNTER — Telehealth: Payer: Self-pay | Admitting: Cardiology

## 2014-06-24 NOTE — Telephone Encounter (Signed)
Spoke with pt and reminded pt of remote transmission that is due today. Pt verbalized understanding.   

## 2014-06-24 NOTE — Progress Notes (Signed)
Remote ICD transmission.   

## 2014-06-25 LAB — MDC_IDC_ENUM_SESS_TYPE_REMOTE
Battery Voltage: 3.08 V
Brady Statistic RV Percent Paced: 0.01 %
Date Time Interrogation Session: 20150709222801
HighPow Impedance: 133 Ohm
HighPow Impedance: 437 Ohm
HighPow Impedance: 69 Ohm
Lead Channel Impedance Value: 437 Ohm
Lead Channel Pacing Threshold Amplitude: 1.125 V
Lead Channel Pacing Threshold Pulse Width: 0.4 ms
Lead Channel Sensing Intrinsic Amplitude: 22.375 mV
Lead Channel Sensing Intrinsic Amplitude: 22.375 mV
Lead Channel Setting Pacing Amplitude: 2.5 V
Lead Channel Setting Pacing Pulse Width: 0.4 ms
Lead Channel Setting Sensing Sensitivity: 0.3 mV
Zone Setting Detection Interval: 280 ms
Zone Setting Detection Interval: 360 ms
Zone Setting Detection Interval: 400 ms

## 2014-07-16 ENCOUNTER — Encounter: Payer: Self-pay | Admitting: Cardiology

## 2014-07-21 ENCOUNTER — Other Ambulatory Visit: Payer: Self-pay | Admitting: *Deleted

## 2014-07-21 ENCOUNTER — Encounter: Payer: Self-pay | Admitting: Internal Medicine

## 2014-07-21 MED ORDER — METOPROLOL SUCCINATE ER 100 MG PO TB24: 100.0000 mg | ORAL_TABLET | Freq: Every day | ORAL | Status: DC

## 2014-07-21 MED ORDER — POTASSIUM CHLORIDE CRYS ER 20 MEQ PO TBCR: 20.0000 meq | EXTENDED_RELEASE_TABLET | Freq: Every day | ORAL | Status: DC

## 2014-08-20 ENCOUNTER — Other Ambulatory Visit: Payer: Self-pay | Admitting: *Deleted

## 2014-08-20 MED ORDER — RANITIDINE HCL 300 MG PO TABS: 300.0000 mg | ORAL_TABLET | Freq: Every day | ORAL | Status: DC | PRN

## 2014-09-22 ENCOUNTER — Other Ambulatory Visit: Payer: Self-pay

## 2014-09-22 MED ORDER — POTASSIUM CHLORIDE CRYS ER 20 MEQ PO TBCR: 20.0000 meq | EXTENDED_RELEASE_TABLET | Freq: Every day | ORAL | Status: DC

## 2014-09-24 ENCOUNTER — Encounter: Payer: Medicare Other | Admitting: *Deleted

## 2014-09-24 ENCOUNTER — Telehealth: Payer: Self-pay | Admitting: Cardiology

## 2014-09-24 NOTE — Telephone Encounter (Signed)
Spoke with pt and reminded pt of remote transmission that is due today. Pt verbalized understanding.   

## 2014-09-26 ENCOUNTER — Encounter: Payer: Self-pay | Admitting: Cardiology

## 2014-10-02 ENCOUNTER — Telehealth: Payer: Self-pay | Admitting: Internal Medicine

## 2014-10-02 NOTE — Telephone Encounter (Signed)
New Message  Pt called states that he received a letter that his signal did not go through. Please call to discuss

## 2014-10-03 ENCOUNTER — Other Ambulatory Visit: Payer: Self-pay | Admitting: *Deleted

## 2014-10-03 MED ORDER — NITROGLYCERIN 0.4 MG SL SUBL: 0.4000 mg | SUBLINGUAL_TABLET | SUBLINGUAL | Status: DC | PRN

## 2014-10-03 NOTE — Telephone Encounter (Signed)
Instructions given for remote follow up and patient to send over the weekend.

## 2014-10-08 ENCOUNTER — Telehealth: Payer: Self-pay | Admitting: Internal Medicine

## 2014-10-08 NOTE — Telephone Encounter (Signed)
Informed pt that remote was not received. 800# given.

## 2014-10-08 NOTE — Telephone Encounter (Signed)
New message ° ° ° ° ° ° °Did we get his remote transmission? °

## 2014-10-13 ENCOUNTER — Telehealth: Payer: Self-pay | Admitting: Cardiology

## 2014-10-13 NOTE — Telephone Encounter (Signed)
Pt called and stated that he still can not send remote transmission. He has contacted tech services with [employer] with no success. Pt agreed to an appt on 11-12 at 9:30 AM with device clinic to get device checked.

## 2014-10-22 ENCOUNTER — Encounter: Payer: Self-pay | Admitting: Cardiology

## 2014-10-22 ENCOUNTER — Other Ambulatory Visit: Payer: Self-pay

## 2014-10-22 MED ORDER — RANITIDINE HCL 300 MG PO TABS: 300.0000 mg | ORAL_TABLET | Freq: Every day | ORAL | Status: DC | PRN

## 2014-10-23 ENCOUNTER — Ambulatory Visit (INDEPENDENT_AMBULATORY_CARE_PROVIDER_SITE_OTHER): Payer: Medicare Other | Admitting: Cardiology

## 2014-10-23 ENCOUNTER — Ambulatory Visit (INDEPENDENT_AMBULATORY_CARE_PROVIDER_SITE_OTHER): Payer: Medicare Other | Admitting: *Deleted

## 2014-10-23 ENCOUNTER — Encounter: Payer: Self-pay | Admitting: Cardiology

## 2014-10-23 ENCOUNTER — Encounter: Payer: Self-pay | Admitting: Internal Medicine

## 2014-10-23 VITALS — BP 132/70 | HR 100 | Ht 72.0 in | Wt 255.5 lb

## 2014-10-23 DIAGNOSIS — I4729 Other ventricular tachycardia: Secondary | ICD-10-CM

## 2014-10-23 DIAGNOSIS — I472 Ventricular tachycardia, unspecified: Secondary | ICD-10-CM

## 2014-10-23 DIAGNOSIS — I10 Essential (primary) hypertension: Secondary | ICD-10-CM

## 2014-10-23 DIAGNOSIS — I5042 Chronic combined systolic (congestive) and diastolic (congestive) heart failure: Secondary | ICD-10-CM

## 2014-10-23 DIAGNOSIS — I25728 Atherosclerosis of autologous artery coronary artery bypass graft(s) with other forms of angina pectoris: Secondary | ICD-10-CM

## 2014-10-23 DIAGNOSIS — I739 Peripheral vascular disease, unspecified: Secondary | ICD-10-CM

## 2014-10-23 DIAGNOSIS — I2510 Atherosclerotic heart disease of native coronary artery without angina pectoris: Secondary | ICD-10-CM

## 2014-10-23 LAB — MDC_IDC_ENUM_SESS_TYPE_INCLINIC
Date Time Interrogation Session: 20151112101550
HIGH POWER IMPEDANCE MEASURED VALUE: 133 Ohm
HIGH POWER IMPEDANCE MEASURED VALUE: 60 Ohm
HighPow Impedance: 399 Ohm
Lead Channel Pacing Threshold Amplitude: 1 V
Lead Channel Pacing Threshold Pulse Width: 0.4 ms
Lead Channel Sensing Intrinsic Amplitude: 25.625 mV
Lead Channel Sensing Intrinsic Amplitude: 31.625 mV
MDC IDC MSMT BATTERY VOLTAGE: 3.08 V
MDC IDC MSMT LEADCHNL RV IMPEDANCE VALUE: 437 Ohm
MDC IDC SET LEADCHNL RV PACING AMPLITUDE: 2.5 V
MDC IDC SET LEADCHNL RV PACING PULSEWIDTH: 0.4 ms
MDC IDC SET LEADCHNL RV SENSING SENSITIVITY: 0.3 mV
MDC IDC SET ZONE DETECTION INTERVAL: 360 ms
MDC IDC STAT BRADY RV PERCENT PACED: 0.01 %
Zone Setting Detection Interval: 280 ms
Zone Setting Detection Interval: 400 ms

## 2014-10-23 NOTE — Patient Instructions (Signed)
We will schedule an appointment with Dr. Fletcher Anon to check your leg.  Continue your current therapy  I will see you n 6 months.

## 2014-10-23 NOTE — Progress Notes (Signed)
ICD check in clinic. Normal device function. Threshold         and sensing consistent with previous device measurements. SIC=19. Impedance trends stable over time. 6 ventricular arrhythmias---max dur. 4 sec (33bts), Max Avg V 186. Stable thoracic impedance. Histogram distribution appropriate for patient and level of activity. No changes made this session. Device programmed at appropriate safety margins. Device programmed to optimize intrinsic conduction. Batt voltage 3.08V (ERI 2.63V). Pt enrolled in remote follow-up. Plan to follow up with SK on 1-7 @ 11:15am. Patient education completed including shock plan. Alert tones demonstrated for patient.

## 2014-10-24 NOTE — Progress Notes (Signed)
[name] Date of Birth: [birth year] Medical Record #[id number]  History of Present Illness: Mr. Bobier is seen back today for follow up of CAD. He has known CAD with last cath in 2013 with right and left heart cath due to increasing dyspnea and an abnormal Lexiscan. His cath showed severe 3VD, patent SVG to PD, patent SVG to PL, patent LIMA to LAD with a moderate severe stenosis at the anastomosis site and moderate LV dysfunction. He had normal right heart pressures. EF was 40 to 45%. Medical therapy was recommended to be continued at that time. The area of ischemia noted on the stress test correlated with the LCX occlusion. This is not amenable to revascularization. While there is a moderately severe stenosis at the anastomotic site of the IMA graft to the LAD, there was no ischemia noted in this terrority on the Myoview. The apex was scar. He has had prior attempted angioplasty of this lesion with moderate success but it was an extremely difficult procedure. On follow up today he states he is doing well from a cardiac standpoint. He denies any chest pain and his breathing has been doing fairly well. No edema. He only uses Ntg when he gets excited or upset.  His other issues include HLD, gout, HTN, VT with ICD in place, OSA, DM and obesity. He has had angiography and stent to the right popliteal artery in Feb. 2014 by Dr. Fletcher Anon. Over the past 6 weeks he has noted recurrent claudication in his right leg like he had before. It is worse with walking or squatting.    Current Outpatient Prescriptions  Medication Sig Dispense Refill  . allopurinol (ZYLOPRIM) 300 MG tablet Take 300 mg by mouth daily.      [name] aspirin EC 81 MG tablet Take 1 tablet (81 mg total) by mouth daily.    [name] atorvastatin (LIPITOR) 40 MG tablet Take 1 tablet (40 mg total) by mouth daily. 30 tablet 5  . fenofibrate 160 MG tablet Take 160 mg by mouth daily.     [name] glipiZIDE (GLUCOTROL) 10 MG tablet Take 10 mg by mouth daily.     [name]  HYDROcodone-acetaminophen (NORCO/VICODIN) 5-325 MG per tablet Take 2 tablets by mouth every 8 (eight) hours as needed. For pain    . insulin detemir (LEVEMIR) 100 UNIT/ML injection Inject 90 Units into the skin 2 (two) times daily.     . Liraglutide 18 MG/3ML SOPN Inject 1.2 mg into the skin daily.    . metoprolol succinate (TOPROL-XL) 100 MG 24 hr tablet Take 1 tablet (100 mg total) by mouth daily. Take with or immediately following a meal. 30 tablet 6  . nitroGLYCERIN (NITROSTAT) 0.4 MG SL tablet Place 1 tablet (0.4 mg total) under the tongue every 5 (five) minutes x 3 doses as needed for chest pain. 25 tablet 3  . NOVOTWIST 32G X 5 MM MISC As directed    . potassium chloride SA (K-DUR,KLOR-CON) 20 MEQ tablet Take 1 tablet (20 mEq total) by mouth daily. 30 tablet 10  . ranitidine (ZANTAC) 300 MG tablet Take 1 tablet (300 mg total) by mouth daily as needed for heartburn. 30 tablet 3  . valsartan (DIOVAN) 160 MG tablet Take 1 tablet (160 mg total) by mouth daily. 30 tablet 11   No current facility-administered medications for this visit.    Allergies  Allergen Reactions  . Adhesive [Tape]     Pulls skin off  . Morphine And Related Nausea And Vomiting  Past Medical History  Diagnosis Date  . Hyperlipidemia   . Hypertension   . Coronary heart disease     a. s/p CABG x 3 (VG->PDA, VG->PL, LIMA->LAD);  b. 2013 Abnl Myoview;  c. 2013 Cath: 3/3 patent grafts, occluded LCX which correlated w/ ischemia on MV->not amenable to PCI->Med Rx.  . Obesity   . CHF (congestive heart failure)   . VT (ventricular tachycardia)   . SVT (supraventricular tachycardia)   . PAD (peripheral artery disease)     Angiography in February of 2014: Moderate left iliac artery stenosis, mild to moderate diffuse right SFA disease. Severe proximal right popliteal artery stenosis with three-vessel runoff below the knee. Status post self-expanding stent placement to the proximal popliteal artery  . Implantable  cardioverter-defibrillator Mdt   . Asthma     "small touch" (07/29/2013)  . History of pneumonia     "3-4 times; last time ~ 2003" (07/29/2013)  . OSA on CPAP   . Type II diabetes mellitus     on insulin  . GERD (gastroesophageal reflux disease)   . H/O hiatal hernia   . Gout   . Anxiety     Past Surgical History  Procedure Laterality Date  . Tonsillectomy    . Cardiac catheterization    . Wrist fracture surgery Right   . Cataract extraction w/ intraocular lens implant Left   . Cardiac defibrillator placement  1997; ~ 2003  . Coronary artery bypass graft  1997; 2007    "X3" (07/29/2013)  . Nerve, tendon and artery repair Left 09/20/2013    Procedure: IRRIGATION AND DEBRIDEMENT,Exploration and Repair of Ulnar/Digital  Artery Nerve of Left Index finger;  Surgeon: Roseanne Kaufman, MD;  Location: [facility];  Service: Orthopedics;  Laterality: Left;    History  Smoking status  . Former Smoker -- 2.00 packs/day for 30 years  . Types: Cigarettes  . Quit date: 12/13/1995  Smokeless tobacco  . Never Used    History  Alcohol Use No    Comment: 07/29/2013 "not touched any alcohol since 1997"    Family History  Problem Relation Age of Onset  . Heart disease Brother   . Cancer Brother   . Breast cancer Mother     Review of Systems: The review of systems is per the HPI.  All other systems were reviewed and are negative.  Physical Exam: BP 132/70 mmHg  Pulse 100  Ht 6' (1.829 m)  Wt 255 lb 8 oz (115.894 kg)  BMI 34.64 kg/m2 Patient is very pleasant and in no acute distress. Skin is warm and dry. Color is normal.  HEENT is unremarkable. Normocephalic/atraumatic. PERRL. Sclera are nonicteric. Neck is supple. No masses. No JVD. Lungs are clear. Cardiac exam shows a regular rate and rhythm. Abdomen is soft. Extremities are without edema. Feet are warm and dry. Gait and ROM are intact. No gross neurologic deficits noted.  LABORATORY DATA: Lab Results  Component Value Date   WBC 7.5  09/20/2013   HGB 15.3 09/20/2013   HCT 45.0 09/20/2013   PLT 156 09/20/2013   GLUCOSE 222* 09/20/2013   CHOL 138 07/30/2013   TRIG 321* 07/30/2013   HDL 25* 07/30/2013   LDLCALC 49 07/30/2013   ALT 13 07/29/2013   AST 30 07/29/2013   NA 140 09/20/2013   K 4.3 09/20/2013   CL 104 09/20/2013   CREATININE 1.30 09/20/2013   BUN 15 09/20/2013   CO2 23 07/30/2013   TSH 2.055 07/29/2013   INR  1.00 07/29/2013   HGBA1C 6.7* 07/29/2013   Ecg today shows NSR with first degree AV block. Otherwise normal. I have personally reviewed and interpreted this study.   Assessment / Plan: 1. CAD - will continue with medical management. See note above.   2. HTN - blood pressure is controlled.   3. Chronic systolic CHF.  Ischemic CM EF 40-45%. Well compensated on current therapy.  4. PAD - with complaint of leg pain/claudication. Similar to symptoms prior to popliteal stent. Will refer back to Dr. Fletcher Anon for evaluation.  5. VT with underlying ICD - followed by Dr. Caryl Comes with recent check. Ventricular arrhythmias less than 4 seconds.    6. DM   7. Chronic dyspnea- multifactorial. On appropriate cardiac therapy. Encourage continued walking program.

## 2014-10-31 ENCOUNTER — Other Ambulatory Visit: Payer: Self-pay

## 2014-10-31 DIAGNOSIS — M79606 Pain in leg, unspecified: Secondary | ICD-10-CM

## 2014-11-03 ENCOUNTER — Other Ambulatory Visit (HOSPITAL_COMMUNITY): Payer: Self-pay | Admitting: Cardiology

## 2014-11-03 DIAGNOSIS — I739 Peripheral vascular disease, unspecified: Secondary | ICD-10-CM

## 2014-11-11 ENCOUNTER — Ambulatory Visit (HOSPITAL_COMMUNITY): Payer: Medicare Other | Attending: Cardiovascular Disease | Admitting: *Deleted

## 2014-11-11 DIAGNOSIS — M79606 Pain in leg, unspecified: Secondary | ICD-10-CM | POA: Diagnosis present

## 2014-11-11 DIAGNOSIS — I739 Peripheral vascular disease, unspecified: Secondary | ICD-10-CM

## 2014-11-11 NOTE — Progress Notes (Signed)
Arterial Doppler Lower Single Level Performed

## 2014-11-14 ENCOUNTER — Telehealth: Payer: Self-pay | Admitting: *Deleted

## 2014-11-14 NOTE — Telephone Encounter (Signed)
Pt came to the office today with c/o heavines in his chest and SOB. He got a flu shot yesterday at his PCP and the heaviness started after getting the shot. He says last night if he leaned back in his chair he could feel it in his back. He slept well and reports he is always SOB. He feels swollen in the left chest and the heaviness will come back if he raises his arm. He did take a NTG this morning and the discomfort eventually eased off. He reports this is nothing like any pain he has had prior to stents. Reassurance given to patient and Patient voiced understanding to go to the ER if the pain reoccurs or changes.

## 2014-11-18 ENCOUNTER — Other Ambulatory Visit: Payer: Self-pay

## 2014-11-18 DIAGNOSIS — I739 Peripheral vascular disease, unspecified: Secondary | ICD-10-CM

## 2014-11-20 ENCOUNTER — Encounter (HOSPITAL_COMMUNITY): Payer: Self-pay | Admitting: Cardiovascular Disease

## 2014-11-21 ENCOUNTER — Ambulatory Visit (HOSPITAL_COMMUNITY): Payer: Medicare Other | Attending: Cardiovascular Disease | Admitting: *Deleted

## 2014-11-21 ENCOUNTER — Other Ambulatory Visit: Payer: Self-pay | Admitting: *Deleted

## 2014-11-21 DIAGNOSIS — Z87891 Personal history of nicotine dependence: Secondary | ICD-10-CM | POA: Insufficient documentation

## 2014-11-21 DIAGNOSIS — I2510 Atherosclerotic heart disease of native coronary artery without angina pectoris: Secondary | ICD-10-CM | POA: Insufficient documentation

## 2014-11-21 DIAGNOSIS — E119 Type 2 diabetes mellitus without complications: Secondary | ICD-10-CM | POA: Insufficient documentation

## 2014-11-21 DIAGNOSIS — Z4589 Encounter for adjustment and management of other implanted devices: Secondary | ICD-10-CM | POA: Insufficient documentation

## 2014-11-21 DIAGNOSIS — Z95820 Peripheral vascular angioplasty status with implants and grafts: Secondary | ICD-10-CM | POA: Diagnosis not present

## 2014-11-21 DIAGNOSIS — E785 Hyperlipidemia, unspecified: Secondary | ICD-10-CM | POA: Diagnosis not present

## 2014-11-21 DIAGNOSIS — I739 Peripheral vascular disease, unspecified: Secondary | ICD-10-CM

## 2014-11-21 DIAGNOSIS — I10 Essential (primary) hypertension: Secondary | ICD-10-CM | POA: Diagnosis not present

## 2014-11-21 MED ORDER — ATORVASTATIN CALCIUM 40 MG PO TABS: 40.0000 mg | ORAL_TABLET | Freq: Every day | ORAL | Status: DC

## 2014-11-21 NOTE — Progress Notes (Signed)
LEA Duplex performed

## 2014-12-16 ENCOUNTER — Ambulatory Visit (INDEPENDENT_AMBULATORY_CARE_PROVIDER_SITE_OTHER): Payer: Medicare Other | Admitting: Cardiovascular Disease

## 2014-12-16 ENCOUNTER — Encounter: Payer: Self-pay | Admitting: Cardiovascular Disease

## 2014-12-16 VITALS — BP 138/82 | HR 74 | Ht 72.0 in | Wt 259.4 lb

## 2014-12-16 DIAGNOSIS — I10 Essential (primary) hypertension: Secondary | ICD-10-CM

## 2014-12-16 DIAGNOSIS — I739 Peripheral vascular disease, unspecified: Secondary | ICD-10-CM

## 2014-12-16 DIAGNOSIS — E785 Hyperlipidemia, unspecified: Secondary | ICD-10-CM

## 2014-12-16 NOTE — Assessment & Plan Note (Signed)
BP is well controlled on current medications.

## 2014-12-16 NOTE — Progress Notes (Signed)
Primary care physician: Dr. Vista Lawman Cardiologist: Dr. Martinique and Dr. Caryl Comes.  HPI  This is a 69 year old male is here today for a followup visit regarding peripheral arterial disease. He has known history of coronary artery disease status post CABG and ischemic cardiomyopathy.  He was seen in early 2014 for severe right calf claudication. I performed lower extremity angiography in 01/2013 which showed moderate left common iliac artery stenosis. On the right side, there was mild to moderate diffuse SFA disease and severe proximal popliteal artery stenosis with three-vessel runoff below the knee. I placed a self-expanding stent to the proximal popliteal artery without complications. He had resolution of claudication at that time.  He now reports recurrent bilateral calf claudication worse on the right side . This is happening with walking about 100 feet. He does seem to be deconditioned with significant dyspnea.   Allergies  Allergen Reactions  . Adhesive [Tape]     Pulls skin off  . Morphine And Related Nausea And Vomiting     Current Outpatient Prescriptions on File Prior to Visit  Medication Sig Dispense Refill  . allopurinol (ZYLOPRIM) 300 MG tablet Take 300 mg by mouth daily.      [name] aspirin EC 81 MG tablet Take 1 tablet (81 mg total) by mouth daily.    [name] atorvastatin (LIPITOR) 40 MG tablet Take 1 tablet (40 mg total) by mouth daily. 30 tablet 5  . fenofibrate 160 MG tablet Take 160 mg by mouth daily.     [name] glipiZIDE (GLUCOTROL) 10 MG tablet Take 10 mg by mouth daily.     [name] HYDROcodone-acetaminophen (NORCO/VICODIN) 5-325 MG per tablet Take 2 tablets by mouth every 8 (eight) hours as needed. For pain    . insulin detemir (LEVEMIR) 100 UNIT/ML injection Inject 90 Units into the skin 2 (two) times daily.     . Liraglutide 18 MG/3ML SOPN Inject 1.2 mg into the skin daily.    . metoprolol succinate (TOPROL-XL) 100 MG 24 hr tablet Take 1 tablet (100 mg total) by mouth daily. Take with or  immediately following a meal. 30 tablet 6  . nitroGLYCERIN (NITROSTAT) 0.4 MG SL tablet Place 1 tablet (0.4 mg total) under the tongue every 5 (five) minutes x 3 doses as needed for chest pain. 25 tablet 3  . potassium chloride SA (K-DUR,KLOR-CON) 20 MEQ tablet Take 1 tablet (20 mEq total) by mouth daily. 30 tablet 10  . ranitidine (ZANTAC) 300 MG tablet Take 1 tablet (300 mg total) by mouth daily as needed for heartburn. 30 tablet 3  . valsartan (DIOVAN) 160 MG tablet Take 1 tablet (160 mg total) by mouth daily. 30 tablet 11   No current facility-administered medications on file prior to visit.     Past Medical History  Diagnosis Date  . Hyperlipidemia   . Hypertension   . Coronary heart disease     a. s/p CABG x 3 (VG->PDA, VG->PL, LIMA->LAD);  b. 2013 Abnl Myoview;  c. 2013 Cath: 3/3 patent grafts, occluded LCX which correlated w/ ischemia on MV->not amenable to PCI->Med Rx.  . Obesity   . CHF (congestive heart failure)   . VT (ventricular tachycardia)   . SVT (supraventricular tachycardia)   . PAD (peripheral artery disease)     Angiography in February of 2014: Moderate left iliac artery stenosis, mild to moderate diffuse right SFA disease. Severe proximal right popliteal artery stenosis with three-vessel runoff below the knee. Status post self-expanding stent placement to the proximal popliteal  artery  . Implantable cardioverter-defibrillator Mdt   . Asthma     "small touch" (07/29/2013)  . History of pneumonia     "3-4 times; last time ~ 2003" (07/29/2013)  . OSA on CPAP   . Type II diabetes mellitus     on insulin  . GERD (gastroesophageal reflux disease)   . H/O hiatal hernia   . Gout   . Anxiety      Past Surgical History  Procedure Laterality Date  . Tonsillectomy    . Cardiac catheterization    . Wrist fracture surgery Right   . Cataract extraction w/ intraocular lens implant Left   . Cardiac defibrillator placement  1997; ~ 2003  . Coronary artery bypass graft   1997; 2007    "X3" (07/29/2013)  . Nerve, tendon and artery repair Left 09/20/2013    Procedure: IRRIGATION AND DEBRIDEMENT,Exploration and Repair of Ulnar/Digital  Artery Nerve of Left Index finger;  Surgeon: Roseanne Kaufman, MD;  Location: [facility];  Service: Orthopedics;  Laterality: Left;  . Abdominal aortagram N/A 01/30/2013    Procedure: ABDOMINAL Maxcine Ham;  Surgeon: Wellington Hampshire, MD;  Location: [facility] CATH LAB;  Service: Cardiovascular;  Laterality: N/A;     Family History  Problem Relation Age of Onset  . Heart disease Brother   . Cancer Brother   . Breast cancer Mother      History   Social History  . Marital Status: Single    Spouse Name: N/A    Number of Children: 0  . Years of Education: N/A   Occupational History  . retired Surveyor, mining   .     Social History Main Topics  . Smoking status: Former Smoker -- 2.00 packs/day for 30 years    Types: Cigarettes    Quit date: 12/13/1995  . Smokeless tobacco: Never Used  . Alcohol Use: No     Comment: 07/29/2013 "not touched any alcohol since 1997"  . Drug Use: No  . Sexual Activity: Not Currently   Other Topics Concern  . Not on file   Social History Narrative       PHYSICAL EXAM   BP 138/82 mmHg  Pulse 74  Ht 6' (1.829 m)  Wt 259 lb 6.4 oz (117.663 kg)  BMI 35.17 kg/m2 Constitutional: He is oriented to person, place, and time. He appears well-developed and well-nourished. No distress.  HENT: No nasal discharge.  Head: Normocephalic and atraumatic.  Eyes: Pupils are equal and round. Right eye exhibits no discharge. Left eye exhibits no discharge.  Neck: Normal range of motion. Neck supple. No JVD present. No thyromegaly present.  Cardiovascular: Normal rate, regular rhythm, normal heart sounds and. Exam reveals no gallop and no friction rub. No murmur heard.  Pulmonary/Chest: Effort normal and breath sounds normal. No stridor. No respiratory distress. He has no wheezes. He has no rales. He exhibits  no tenderness.  Abdominal: Soft. Bowel sounds are normal. He exhibits no distension. There is no tenderness. There is no rebound and no guarding.  Musculoskeletal: Normal range of motion. He exhibits no edema and no tenderness.  Neurological: He is alert and oriented to person, place, and time. Coordination normal.  Skin: Skin is warm and dry. No rash noted. He is not diaphoretic. No erythema. No pallor.  Psychiatric: He has a normal mood and affect. His behavior is normal. Judgment and thought content normal.  Vascular: Femoral pulses are mildly diminished bilaterally.  DP: palpable on both side. PT : not palpable.  ASSESSMENT AND PLAN

## 2014-12-16 NOTE — Assessment & Plan Note (Signed)
The patient is complaining of bilateral calf claudication worse on the right side. However, distal pluses are palpable and recent noninvasive evaluation showed patent right popliteal artery stent with an ABI of 0.88 on the right. His symptoms are clearly out of proportion to his PAD.  He seems to be very deconditioned. I asked him to increase his walking distance. I will reevaluate in 6 months and consider angiography if no improvement.

## 2014-12-16 NOTE — Patient Instructions (Signed)
Your physician wants you to follow-up in: 6 MONTHS with Dr Arida.  You will receive a reminder letter in the mail two months in advance. If you don't receive a letter, please call our office to schedule the follow-up appointment.  Your physician recommends that you continue on your current medications as directed. Please refer to the Current Medication list given to you today.  

## 2014-12-16 NOTE — Assessment & Plan Note (Signed)
Lab Results  Component Value Date   CHOL 138 07/30/2013   HDL 25* 07/30/2013   LDLCALC 49 07/30/2013   TRIG 321* 07/30/2013   CHOLHDL 5.5 07/30/2013   Continue treatment with Atorvastatin.

## 2014-12-18 ENCOUNTER — Ambulatory Visit (INDEPENDENT_AMBULATORY_CARE_PROVIDER_SITE_OTHER): Payer: Medicare Other | Admitting: Internal Medicine

## 2014-12-18 ENCOUNTER — Encounter: Payer: Self-pay | Admitting: Internal Medicine

## 2014-12-18 VITALS — BP 138/72 | HR 74 | Ht 72.0 in | Wt 259.0 lb

## 2014-12-18 DIAGNOSIS — Z4502 Encounter for adjustment and management of automatic implantable cardiac defibrillator: Secondary | ICD-10-CM

## 2014-12-18 DIAGNOSIS — I472 Ventricular tachycardia: Secondary | ICD-10-CM

## 2014-12-18 DIAGNOSIS — I5042 Chronic combined systolic (congestive) and diastolic (congestive) heart failure: Secondary | ICD-10-CM

## 2014-12-18 DIAGNOSIS — I4729 Other ventricular tachycardia: Secondary | ICD-10-CM

## 2014-12-18 DIAGNOSIS — I255 Ischemic cardiomyopathy: Secondary | ICD-10-CM

## 2014-12-18 DIAGNOSIS — R55 Syncope and collapse: Secondary | ICD-10-CM

## 2014-12-18 LAB — MDC_IDC_ENUM_SESS_TYPE_INCLINIC
Battery Voltage: 3.05 V
Brady Statistic RV Percent Paced: 0.01 %
HIGH POWER IMPEDANCE MEASURED VALUE: 171 Ohm
HIGH POWER IMPEDANCE MEASURED VALUE: 399 Ohm
HighPow Impedance: 68 Ohm
Lead Channel Impedance Value: 437 Ohm
Lead Channel Pacing Threshold Amplitude: 1.125 V
Lead Channel Pacing Threshold Pulse Width: 0.4 ms
Lead Channel Sensing Intrinsic Amplitude: 31.625 mV
Lead Channel Setting Pacing Pulse Width: 0.4 ms
Lead Channel Setting Sensing Sensitivity: 0.3 mV
MDC IDC SESS DTM: 20160107120455
MDC IDC SET LEADCHNL RV PACING AMPLITUDE: 2.5 V
MDC IDC SET ZONE DETECTION INTERVAL: 400 ms
Zone Setting Detection Interval: 280 ms
Zone Setting Detection Interval: 360 ms

## 2014-12-18 NOTE — Patient Instructions (Signed)

## 2014-12-18 NOTE — Progress Notes (Signed)
To be moving he     Patient Care Team: Karlene Einstein, MD as PCP - General (Family Medicine)   HPI  Walter French is a 69 y.o. 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 He is status post ICD implantation originally implanted in 1997 and changed out subsequently  He underwent generator replacement aug 2012.   He underwent redo CABG in 2007 and then subsequent angioplasty of the IMA anastomosis to the LAD in 2008. Ejection fraction at that time was 50%.   Most recent Myoview 8/13 demonstrated ejection fraction of 32% with old apical scars and inferolateral ischemia  LHC  8/13  Severe three-vessel obstructive coronary disease.  [name] Patent saphenous vein graft to the PDA.,vein graft to the posterior lateral branch. Patent LIMA graft to the LAD. There is a moderately severe stenosis at the anastomotic site.   The patient denies chest pain, shortness of breath, nocturnal dyspnea, orthopnea or peripheral edema.  There have been no palpitations, lightheadedness or syncope.       Past Medical History  Diagnosis Date  . Hyperlipidemia   . Hypertension   . Coronary heart disease     a. s/p CABG x 3 (VG->PDA, VG->PL, LIMA->LAD);  b. 2013 Abnl Myoview;  c. 2013 Cath: 3/3 patent grafts, occluded LCX which correlated w/ ischemia on MV->not amenable to PCI->Med Rx.  . Obesity   . CHF (congestive heart failure)   . VT (ventricular tachycardia)   . SVT (supraventricular tachycardia)   . PAD (peripheral artery disease)     Angiography in February of 2014: Moderate left iliac artery stenosis, mild to moderate diffuse right SFA disease. Severe proximal right popliteal artery stenosis with three-vessel runoff below the knee. Status post self-expanding stent placement to the proximal popliteal artery  . Implantable cardioverter-defibrillator Mdt   .  Asthma     "small touch" (07/29/2013)  . History of pneumonia     "3-4 times; last time ~ 2003" (07/29/2013)  . OSA on CPAP   . Type II diabetes mellitus     on insulin  . GERD (gastroesophageal reflux disease)   . H/O hiatal hernia   . Gout   . Anxiety     Past Surgical History  Procedure Laterality Date  . Tonsillectomy    . Cardiac catheterization    . Wrist fracture surgery Right   . Cataract extraction w/ intraocular lens implant Left   . Cardiac defibrillator placement  1997; ~ 2003  . Coronary artery bypass graft  1997; 2007    "X3" (07/29/2013)  . Nerve, tendon and artery repair Left 09/20/2013    Procedure: IRRIGATION AND DEBRIDEMENT,Exploration and Repair of Ulnar/Digital  Artery Nerve of Left Index finger;  Surgeon: Roseanne Kaufman, MD;  Location: [facility];  Service: Orthopedics;  Laterality: Left;  . Abdominal aortagram N/A 01/30/2013    Procedure: ABDOMINAL Maxcine Ham;  Surgeon: Wellington Hampshire, MD;  Location: [facility] CATH LAB;  Service: Cardiovascular;  Laterality: N/A;    Current Outpatient Prescriptions  Medication Sig Dispense Refill  . allopurinol (ZYLOPRIM) 300 MG tablet Take 300 mg by mouth daily.      [name] aspirin EC 81 MG tablet Take 1 tablet (81 mg total) by mouth daily.    [name] atorvastatin (LIPITOR) 40 MG tablet Take 1 tablet (40 mg total) by mouth daily. 30 tablet 5  . fenofibrate 160 MG tablet Take 160 mg by  mouth daily.     [name] glipiZIDE (GLUCOTROL) 10 MG tablet Take 10 mg by mouth daily.     [name] HYDROcodone-acetaminophen (NORCO/VICODIN) 5-325 MG per tablet Take 2 tablets by mouth every 8 (eight) hours as needed. For pain    . insulin detemir (LEVEMIR) 100 UNIT/ML injection Inject 90 Units into the skin 2 (two) times daily.     [name] levothyroxine (SYNTHROID, LEVOTHROID) 75 MCG tablet Take 75 mcg by mouth daily before breakfast.   0  . Liraglutide 18 MG/3ML SOPN Inject 1.2 mg into the skin daily.    . metoprolol succinate (TOPROL-XL) 100 MG 24 hr tablet Take 1 tablet (100 mg  total) by mouth daily. Take with or immediately following a meal. 30 tablet 6  . nitroGLYCERIN (NITROSTAT) 0.4 MG SL tablet Place 1 tablet (0.4 mg total) under the tongue every 5 (five) minutes x 3 doses as needed for chest pain. 25 tablet 3  . potassium chloride SA (K-DUR,KLOR-CON) 20 MEQ tablet Take 1 tablet (20 mEq total) by mouth daily. 30 tablet 10  . ranitidine (ZANTAC) 300 MG tablet Take 1 tablet (300 mg total) by mouth daily as needed for heartburn. 30 tablet 3  . valsartan (DIOVAN) 160 MG tablet Take 1 tablet (160 mg total) by mouth daily. 30 tablet 11   No current facility-administered medications for this visit.    Allergies  Allergen Reactions  . Adhesive [Tape]     Pulls skin off  . Morphine And Related Nausea And Vomiting    Review of Systems negative except from HPI and PMH  Physical Exam BP 138/72 mmHg  Pulse 74  Ht 6' (1.829 m)  Wt 259 lb (117.482 kg)  BMI 35.12 kg/m2 Well developed and well nourished in no acute distress HENT normal E scleral and icterus clear Neck Supple JVP flat; carotids brisk and full Clear to ausculation  Regular rate and rhythm, no murmurs gallops or rub Soft with active bowel sounds No clubbing cyanosis no  Edema Alert and oriented, grossly normal motor and sensory function Skin Warm and Dry    Assessment and  Plan  Ischemic cardiomyopathy with prior CABG  Ventricular tachycardia-polymorphic  Congestive heart failure-chronic-systolic  Hypertension   implantable defibrillator-[name] device was interrogated.  The information was reviewed. No changes were made in the programming.     The patient is stable without recurrent ventricular tachycardia  He is euvolemic.  Blood pressures are reasonably controlled.  No symptoms of ischemia.

## 2014-12-24 ENCOUNTER — Encounter: Payer: Self-pay | Admitting: Internal Medicine

## 2015-01-07 ENCOUNTER — Other Ambulatory Visit: Payer: Self-pay | Admitting: *Deleted

## 2015-01-07 MED ORDER — VALSARTAN 160 MG PO TABS: 160.0000 mg | ORAL_TABLET | Freq: Every day | ORAL | Status: DC

## 2015-02-16 ENCOUNTER — Telehealth: Payer: Self-pay | Admitting: *Deleted

## 2015-02-16 ENCOUNTER — Other Ambulatory Visit: Payer: Self-pay | Admitting: *Deleted

## 2015-02-16 MED ORDER — METOPROLOL SUCCINATE ER 100 MG PO TB24: 100.0000 mg | ORAL_TABLET | Freq: Every day | ORAL | Status: DC

## 2015-02-16 MED ORDER — RANITIDINE HCL 300 MG PO TABS: 300.0000 mg | ORAL_TABLET | Freq: Every day | ORAL | Status: DC | PRN

## 2015-02-16 NOTE — Telephone Encounter (Signed)
Spoke to patient zantac refill sent to pharmacy.Follow up appointment scheduled with Dr.Jordan 04/27/15 at 9:45 am.

## 2015-02-25 ENCOUNTER — Other Ambulatory Visit: Payer: Self-pay

## 2015-02-25 MED ORDER — VALSARTAN 160 MG PO TABS: 160.0000 mg | ORAL_TABLET | Freq: Every day | ORAL | Status: DC

## 2015-03-11 ENCOUNTER — Other Ambulatory Visit: Payer: Self-pay | Admitting: Internal Medicine

## 2015-03-11 MED ORDER — ATORVASTATIN CALCIUM 40 MG PO TABS: 40.0000 mg | ORAL_TABLET | Freq: Every day | ORAL | Status: DC

## 2015-03-19 ENCOUNTER — Ambulatory Visit (INDEPENDENT_AMBULATORY_CARE_PROVIDER_SITE_OTHER): Payer: Medicare Other | Admitting: *Deleted

## 2015-03-19 ENCOUNTER — Telehealth: Payer: Self-pay | Admitting: Cardiology

## 2015-03-19 DIAGNOSIS — I255 Ischemic cardiomyopathy: Secondary | ICD-10-CM

## 2015-03-19 DIAGNOSIS — I5042 Chronic combined systolic (congestive) and diastolic (congestive) heart failure: Secondary | ICD-10-CM | POA: Diagnosis not present

## 2015-03-19 LAB — MDC_IDC_ENUM_SESS_TYPE_REMOTE
Battery Voltage: 3.08 V
Brady Statistic RV Percent Paced: 0.02 %
Date Time Interrogation Session: 20160407214517
HIGH POWER IMPEDANCE MEASURED VALUE: 67 Ohm
HighPow Impedance: 380 Ohm
Lead Channel Impedance Value: 437 Ohm
Lead Channel Pacing Threshold Amplitude: 1.25 V
Lead Channel Sensing Intrinsic Amplitude: 23.875 mV
Lead Channel Sensing Intrinsic Amplitude: 23.875 mV
Lead Channel Setting Pacing Amplitude: 2.5 V
Lead Channel Setting Pacing Pulse Width: 0.4 ms
MDC IDC MSMT LEADCHNL RV PACING THRESHOLD PULSEWIDTH: 0.4 ms
MDC IDC SET LEADCHNL RV SENSING SENSITIVITY: 0.3 mV
MDC IDC SET ZONE DETECTION INTERVAL: 280 ms
MDC IDC SET ZONE DETECTION INTERVAL: 360 ms
Zone Setting Detection Interval: 400 ms

## 2015-03-19 NOTE — Telephone Encounter (Signed)
Spoke with pt and reminded pt of remote transmission that is due today. Pt verbalized understanding.   

## 2015-03-20 NOTE — Progress Notes (Signed)
Remote ICD transmission.   

## 2015-04-21 ENCOUNTER — Encounter: Payer: Self-pay | Admitting: Cardiology

## 2015-04-24 ENCOUNTER — Encounter: Payer: Self-pay | Admitting: Internal Medicine

## 2015-04-27 ENCOUNTER — Ambulatory Visit (INDEPENDENT_AMBULATORY_CARE_PROVIDER_SITE_OTHER): Payer: Medicare Other | Admitting: Cardiology

## 2015-04-27 ENCOUNTER — Encounter: Payer: Self-pay | Admitting: Cardiology

## 2015-04-27 VITALS — BP 132/74 | HR 74 | Ht 72.0 in | Wt 254.9 lb

## 2015-04-27 DIAGNOSIS — I4729 Other ventricular tachycardia: Secondary | ICD-10-CM

## 2015-04-27 DIAGNOSIS — Z95810 Presence of automatic (implantable) cardiac defibrillator: Secondary | ICD-10-CM | POA: Diagnosis not present

## 2015-04-27 DIAGNOSIS — I739 Peripheral vascular disease, unspecified: Secondary | ICD-10-CM | POA: Diagnosis not present

## 2015-04-27 DIAGNOSIS — I472 Ventricular tachycardia, unspecified: Secondary | ICD-10-CM

## 2015-04-27 DIAGNOSIS — I255 Ischemic cardiomyopathy: Secondary | ICD-10-CM

## 2015-04-27 DIAGNOSIS — I25709 Atherosclerosis of coronary artery bypass graft(s), unspecified, with unspecified angina pectoris: Secondary | ICD-10-CM

## 2015-04-27 DIAGNOSIS — I5042 Chronic combined systolic (congestive) and diastolic (congestive) heart failure: Secondary | ICD-10-CM

## 2015-04-27 DIAGNOSIS — I10 Essential (primary) hypertension: Secondary | ICD-10-CM

## 2015-04-27 NOTE — Progress Notes (Signed)
[name] Date of Birth: 1946-10-20 Medical Record #[id number]  History of Present Illness: Mr. Blankenhorn is seen  for follow up of CAD. He has known CAD with last cath in 2013 with right and left heart cath due to increasing dyspnea and an abnormal Lexiscan. His cath showed severe 3VD, patent SVG to PD, patent SVG to PL, patent LIMA to LAD with a moderate severe stenosis at the anastomosis site and moderate LV dysfunction. He had normal right heart pressures. EF was 40 to 45%. Medical therapy was recommended to be continued at that time. The area of ischemia noted on the stress test correlated with the LCX occlusion. This is not amenable to revascularization. While there is a moderately severe stenosis at the anastomotic site of the IMA graft to the LAD, there was no ischemia noted in this terrority on the Myoview. The apex was scar. He has had prior attempted angioplasty of this lesion with moderate success but it was an extremely difficult procedure. On follow up today he states he is doing well from a cardiac standpoint. He had pain in his central chest a few weeks ago and it hurt for him to take a deep breath. This lasted several days then resolved. He has chronic dyspnea that is unchanged.  No edema.  His other issues include HLD, gout, HTN, VT with ICD in place, OSA, DM and obesity. He has had angiography and stent to the right popliteal artery in Feb. 2014 by Dr. Fletcher Anon. Repeat doppler in December showed continued patency. He reports that his claudication now is really fairly mild.    Current Outpatient Prescriptions  Medication Sig Dispense Refill  . allopurinol (ZYLOPRIM) 300 MG tablet Take 300 mg by mouth daily.      [name] aspirin EC 81 MG tablet Take 1 tablet (81 mg total) by mouth daily.    [name] atorvastatin (LIPITOR) 40 MG tablet Take 1 tablet (40 mg total) by mouth daily. 90 tablet 3  . fenofibrate 160 MG tablet Take 160 mg by mouth daily.     [name] glipiZIDE (GLUCOTROL) 10 MG tablet Take 10  mg by mouth daily.     [name] HYDROcodone-acetaminophen (NORCO/VICODIN) 5-325 MG per tablet Take 2 tablets by mouth every 8 (eight) hours as needed. For pain    . insulin detemir (LEVEMIR) 100 UNIT/ML injection Inject 90 Units into the skin 2 (two) times daily.     [name] levothyroxine (SYNTHROID, LEVOTHROID) 75 MCG tablet Take 75 mcg by mouth daily before breakfast.   0  . Liraglutide 18 MG/3ML SOPN Inject 1.2 mg into the skin daily.    . metoprolol succinate (TOPROL-XL) 100 MG 24 hr tablet Take 1 tablet (100 mg total) by mouth daily. Take with or immediately following a meal. 30 tablet 6  . nitroGLYCERIN (NITROSTAT) 0.4 MG SL tablet Place 1 tablet (0.4 mg total) under the tongue every 5 (five) minutes x 3 doses as needed for chest pain. 25 tablet 3  . NOVOLOG FLEXPEN 100 UNIT/ML FlexPen Inject 25 Units as directed daily.  0  . potassium chloride SA (K-DUR,KLOR-CON) 20 MEQ tablet Take 1 tablet (20 mEq total) by mouth daily. 30 tablet 10  . ranitidine (ZANTAC) 300 MG tablet Take 1 tablet (300 mg total) by mouth daily as needed for heartburn. 30 tablet 6  . valsartan (DIOVAN) 160 MG tablet Take 1 tablet (160 mg total) by mouth daily. 30 tablet 11   No current facility-administered medications for this visit.  Allergies  Allergen Reactions  . Adhesive [Tape]     Pulls skin off  . Morphine And Related Nausea And Vomiting    Past Medical History  Diagnosis Date  . Hyperlipidemia   . Hypertension   . Coronary heart disease     a. s/p CABG x 3 (VG->PDA, VG->PL, LIMA->LAD);  b. 2013 Abnl Myoview;  c. 2013 Cath: 3/3 patent grafts, occluded LCX which correlated w/ ischemia on MV->not amenable to PCI->Med Rx.  . Obesity   . CHF (congestive heart failure)   . VT (ventricular tachycardia)   . SVT (supraventricular tachycardia)   . PAD (peripheral artery disease)     Angiography in February of 2014: Moderate left iliac artery stenosis, mild to moderate diffuse right SFA disease. Severe proximal right  popliteal artery stenosis with three-vessel runoff below the knee. Status post self-expanding stent placement to the proximal popliteal artery  . Implantable cardioverter-defibrillator Mdt   . Asthma     "small touch" (07/29/2013)  . History of pneumonia     "3-4 times; last time ~ 2003" (07/29/2013)  . OSA on CPAP   . Type II diabetes mellitus     on insulin  . GERD (gastroesophageal reflux disease)   . H/O hiatal hernia   . Gout   . Anxiety     Past Surgical History  Procedure Laterality Date  . Tonsillectomy    . Cardiac catheterization    . Wrist fracture surgery Right   . Cataract extraction w/ intraocular lens implant Left   . Cardiac defibrillator placement  1997; ~ 2003  . Coronary artery bypass graft  1997; 2007    "X3" (07/29/2013)  . Nerve, tendon and artery repair Left 09/20/2013    Procedure: IRRIGATION AND DEBRIDEMENT,Exploration and Repair of Ulnar/Digital  Artery Nerve of Left Index finger;  Surgeon: Roseanne Kaufman, MD;  Location: [facility];  Service: Orthopedics;  Laterality: Left;  . Abdominal aortagram N/A 01/30/2013    Procedure: ABDOMINAL Maxcine Ham;  Surgeon: Wellington Hampshire, MD;  Location: [facility] CATH LAB;  Service: Cardiovascular;  Laterality: N/A;    History  Smoking status  . Former Smoker -- 2.00 packs/day for 30 years  . Types: Cigarettes  . Quit date: 12/13/1995  Smokeless tobacco  . Never Used    History  Alcohol Use No    Comment: 07/29/2013 "not touched any alcohol since 1997"    Family History  Problem Relation Age of Onset  . Heart disease Brother   . Cancer Brother   . Breast cancer Mother     Review of Systems: The review of systems is per the HPI.  All other systems were reviewed and are negative.  Physical Exam: BP 132/74 mmHg  Pulse 74  Ht 6' (1.829 m)  Wt 254 lb 14.4 oz (115.622 kg)  BMI 34.56 kg/m2 Patient is very pleasant and in no acute distress. Skin is warm and dry. Color is normal.  HEENT is unremarkable.  Normocephalic/atraumatic. PERRL. Sclera are nonicteric. Neck is supple. No masses. No JVD. Lungs are clear. Cardiac exam shows a regular rate and rhythm. Abdomen is soft. Extremities are without edema. Feet are warm and dry. Gait and ROM are intact. No gross neurologic deficits noted.  LABORATORY DATA: Lab Results  Component Value Date   WBC 7.5 09/20/2013   HGB 15.3 09/20/2013   HCT 45.0 09/20/2013   PLT 156 09/20/2013   GLUCOSE 222* 09/20/2013   CHOL 138 07/30/2013   TRIG 321* 07/30/2013   HDL  25* 07/30/2013   LDLCALC 49 07/30/2013   ALT 13 07/29/2013   AST 30 07/29/2013   NA 140 09/20/2013   K 4.3 09/20/2013   CL 104 09/20/2013   CREATININE 1.30 09/20/2013   BUN 15 09/20/2013   CO2 23 07/30/2013   TSH 2.055 07/29/2013   INR 1.00 07/29/2013   HGBA1C 6.7* 07/29/2013   Ecg today shows NSR with first degree AV block and occ. PVCs. Old anterior infarct.  I have personally reviewed and interpreted this study.   Assessment / Plan: 1. CAD - will continue with medical management. Last cath in 2013 showed patent grafts and he is asymptomatic.    2. HTN - blood pressure is controlled.   3. Chronic systolic CHF.  Ischemic CM EF 40-45%. Well compensated on current therapy. He is not currently on a diuretic and appears euvolemic. Will DC potassium  4. PAD - s/p  popliteal stent. Symptoms improved.  5. VT with underlying ICD - followed by Dr. Caryl Comes with recent check. Ventricular arrhythmias less than 15 beats.    6. DM - reports good control. Weight down 5 lbs.   7. Chronic dyspnea- multifactorial. On appropriate cardiac therapy. Encourage continued walking program.

## 2015-04-27 NOTE — Patient Instructions (Signed)
Stop taking potassium  Continue your other therapy  I will see you in 6 months.

## 2015-04-27 NOTE — Addendum Note (Signed)
Addended by: Golden Hurter D on: 04/27/2015 11:10 AM   Modules accepted: Orders

## 2015-05-25 ENCOUNTER — Encounter: Payer: Self-pay | Admitting: Cardiology

## 2015-06-08 ENCOUNTER — Other Ambulatory Visit: Payer: Self-pay | Admitting: Cardiovascular Disease

## 2015-06-08 DIAGNOSIS — I739 Peripheral vascular disease, unspecified: Secondary | ICD-10-CM

## 2015-06-16 ENCOUNTER — Ambulatory Visit (INDEPENDENT_AMBULATORY_CARE_PROVIDER_SITE_OTHER): Payer: Medicare Other | Admitting: Cardiovascular Disease

## 2015-06-16 ENCOUNTER — Encounter: Payer: Self-pay | Admitting: Cardiovascular Disease

## 2015-06-16 ENCOUNTER — Ambulatory Visit (HOSPITAL_COMMUNITY): Payer: Medicare Other

## 2015-06-16 ENCOUNTER — Ambulatory Visit (HOSPITAL_COMMUNITY): Payer: Medicare Other | Attending: Cardiovascular Disease

## 2015-06-16 VITALS — BP 128/78 | HR 76 | Ht 72.0 in | Wt 256.0 lb

## 2015-06-16 DIAGNOSIS — I739 Peripheral vascular disease, unspecified: Secondary | ICD-10-CM | POA: Diagnosis not present

## 2015-06-16 DIAGNOSIS — I255 Ischemic cardiomyopathy: Secondary | ICD-10-CM | POA: Diagnosis not present

## 2015-06-16 NOTE — Patient Instructions (Signed)
Medication Instructions:  Your physician recommends that you continue on your current medications as directed. Please refer to the Current Medication list given to you today.  Labwork: No new orders.  Testing/Procedures: Your physician has requested that you have a lower extremity arterial duplex in 9 MONTHS. This test is an ultrasound of the arteries in the legs. It looks at arterial blood flow in the legs. Allow one hour for Lower Arterial scans. There are no restrictions or special instructions  Follow-Up: Your physician wants you to follow-up in: 9 MONTHS with Dr Fletcher Anon.  You will receive a reminder letter in the mail two months in advance. If you don't receive a letter, please call our office to schedule the follow-up appointment.  Any Other Special Instructions Will Be Listed Below (If Applicable).

## 2015-06-16 NOTE — Assessment & Plan Note (Signed)
The patient complains of bilateral leg pain with walking which seems to be mild overall. Noninvasive evaluation today showed an ABI of 0.97 on the right and 1.06 on the left. Distal pulses are palpable. I advised him to start an exercise program. Continue treatment of risk factors. Follow-up in 9 months with lower extremity arterial duplex.

## 2015-06-16 NOTE — Progress Notes (Signed)
Primary care physician: Dr. Vista Lawman Cardiologist: Dr. Martinique and Dr. Caryl Comes.  HPI  This is a 69 year old male is here today for a followup visit regarding peripheral arterial disease. He has known history of coronary artery disease status post CABG and ischemic cardiomyopathy.  He was seen in early 2014 for severe right calf claudication. I performed lower extremity angiography in 01/2013 which showed moderate left common iliac artery stenosis. On the right side, there was mild to moderate diffuse SFA disease and severe proximal popliteal artery stenosis with three-vessel runoff below the knee. I placed a self-expanding stent to the proximal popliteal artery without complications. He had resolution of claudication at that time.  Most recent duplex showed patent stent. He reports bilateral leg cramping worse on the left side especially when he walks uphill. He does not exercise on a regular basis and has chronic exertional dyspnea.  Allergies  Allergen Reactions  . Adhesive [Tape]     Pulls skin off  . Morphine And Related Nausea And Vomiting     Current Outpatient Prescriptions on File Prior to Visit  Medication Sig Dispense Refill  . allopurinol (ZYLOPRIM) 300 MG tablet Take 300 mg by mouth daily.      [name] aspirin EC 81 MG tablet Take 1 tablet (81 mg total) by mouth daily.    [name] atorvastatin (LIPITOR) 40 MG tablet Take 1 tablet (40 mg total) by mouth daily. 90 tablet 3  . fenofibrate 160 MG tablet Take 160 mg by mouth daily.     [name] glipiZIDE (GLUCOTROL) 10 MG tablet Take 10 mg by mouth daily.     [name] HYDROcodone-acetaminophen (NORCO/VICODIN) 5-325 MG per tablet Take 2 tablets by mouth every 8 (eight) hours as needed. For pain    . insulin detemir (LEVEMIR) 100 UNIT/ML injection Inject 90 Units into the skin 2 (two) times daily.     [name] levothyroxine (SYNTHROID, LEVOTHROID) 75 MCG tablet Take 75 mcg by mouth daily before breakfast.   0  . Liraglutide 18 MG/3ML SOPN Inject 1.2 mg into the skin  daily.    . metoprolol succinate (TOPROL-XL) 100 MG 24 hr tablet Take 1 tablet (100 mg total) by mouth daily. Take with or immediately following a meal. 30 tablet 6  . nitroGLYCERIN (NITROSTAT) 0.4 MG SL tablet Place 1 tablet (0.4 mg total) under the tongue every 5 (five) minutes x 3 doses as needed for chest pain. 25 tablet 3  . NOVOLOG FLEXPEN 100 UNIT/ML FlexPen Inject 25 Units as directed daily.  0  . ranitidine (ZANTAC) 300 MG tablet Take 1 tablet (300 mg total) by mouth daily as needed for heartburn. 30 tablet 6  . valsartan (DIOVAN) 160 MG tablet Take 1 tablet (160 mg total) by mouth daily. 30 tablet 11   No current facility-administered medications on file prior to visit.     Past Medical History  Diagnosis Date  . Hyperlipidemia   . Hypertension   . Coronary heart disease     a. s/p CABG x 3 (VG->PDA, VG->PL, LIMA->LAD);  b. 2013 Abnl Myoview;  c. 2013 Cath: 3/3 patent grafts, occluded LCX which correlated w/ ischemia on MV->not amenable to PCI->Med Rx.  . Obesity   . CHF (congestive heart failure)   . VT (ventricular tachycardia)   . SVT (supraventricular tachycardia)   . PAD (peripheral artery disease)     Angiography in February of 2014: Moderate left iliac artery stenosis, mild to moderate diffuse right SFA disease. Severe proximal right popliteal artery  stenosis with three-vessel runoff below the knee. Status post self-expanding stent placement to the proximal popliteal artery  . Implantable cardioverter-defibrillator Mdt   . Asthma     "small touch" (07/29/2013)  . History of pneumonia     "3-4 times; last time ~ 2003" (07/29/2013)  . OSA on CPAP   . Type II diabetes mellitus     on insulin  . GERD (gastroesophageal reflux disease)   . H/O hiatal hernia   . Gout   . Anxiety      Past Surgical History  Procedure Laterality Date  . Tonsillectomy    . Cardiac catheterization    . Wrist fracture surgery Right   . Cataract extraction w/ intraocular lens implant Left    . Cardiac defibrillator placement  1997; ~ 2003  . Coronary artery bypass graft  1997; 2007    "X3" (07/29/2013)  . Nerve, tendon and artery repair Left 09/20/2013    Procedure: IRRIGATION AND DEBRIDEMENT,Exploration and Repair of Ulnar/Digital  Artery Nerve of Left Index finger;  Surgeon: Roseanne Kaufman, MD;  Location: [facility];  Service: Orthopedics;  Laterality: Left;  . Abdominal aortagram N/A 01/30/2013    Procedure: ABDOMINAL Maxcine Ham;  Surgeon: Wellington Hampshire, MD;  Location: [facility] CATH LAB;  Service: Cardiovascular;  Laterality: N/A;     Family History  Problem Relation Age of Onset  . Heart disease Brother   . Cancer Brother   . Breast cancer Mother      History   Social History  . Marital Status: Single    Spouse Name: N/A  . Number of Children: 0  . Years of Education: N/A   Occupational History  . retired Surveyor, mining   .     Social History Main Topics  . Smoking status: Former Smoker -- 2.00 packs/day for 30 years    Types: Cigarettes    Quit date: 12/13/1995  . Smokeless tobacco: Never Used  . Alcohol Use: No     Comment: 07/29/2013 "not touched any alcohol since 1997"  . Drug Use: No  . Sexual Activity: Not Currently   Other Topics Concern  . Not on file   Social History Narrative       PHYSICAL EXAM   BP 128/78 mmHg  Pulse 76  Ht 6' (1.829 m)  Wt 256 lb (116.121 kg)  BMI 34.71 kg/m2  SpO2 97% Constitutional: He is oriented to person, place, and time. He appears well-developed and well-nourished. No distress.  HENT: No nasal discharge.  Head: Normocephalic and atraumatic.  Eyes: Pupils are equal and round. Right eye exhibits no discharge. Left eye exhibits no discharge.  Neck: Normal range of motion. Neck supple. No JVD present. No thyromegaly present.  Cardiovascular: Normal rate, regular rhythm, normal heart sounds and. Exam reveals no gallop and no friction rub. No murmur heard.  Pulmonary/Chest: Effort normal and breath sounds  normal. No stridor. No respiratory distress. He has no wheezes. He has no rales. He exhibits no tenderness.  Abdominal: Soft. Bowel sounds are normal. He exhibits no distension. There is no tenderness. There is no rebound and no guarding.  Musculoskeletal: Normal range of motion. He exhibits no edema and no tenderness.  Neurological: He is alert and oriented to person, place, and time. Coordination normal.  Skin: Skin is warm and dry. No rash noted. He is not diaphoretic. No erythema. No pallor.  Psychiatric: He has a normal mood and affect. His behavior is normal. Judgment and thought content normal.  Vascular:  Femoral pulses are mildly diminished bilaterally.  DP: palpable on both side. PT : not palpable.      ASSESSMENT AND PLAN

## 2015-06-22 ENCOUNTER — Encounter: Payer: Medicare Other | Admitting: *Deleted

## 2015-06-22 ENCOUNTER — Encounter: Payer: Self-pay | Admitting: Cardiology

## 2015-06-22 ENCOUNTER — Telehealth: Payer: Self-pay | Admitting: Cardiology

## 2015-06-22 NOTE — Telephone Encounter (Signed)
LMOVM reminding pt to send remote transmission.   

## 2015-06-23 ENCOUNTER — Encounter: Payer: Self-pay | Admitting: Cardiology

## 2015-06-26 ENCOUNTER — Ambulatory Visit (INDEPENDENT_AMBULATORY_CARE_PROVIDER_SITE_OTHER): Payer: Medicare Other | Admitting: *Deleted

## 2015-06-26 ENCOUNTER — Encounter: Payer: Self-pay | Admitting: Internal Medicine

## 2015-06-26 DIAGNOSIS — I255 Ischemic cardiomyopathy: Secondary | ICD-10-CM | POA: Diagnosis not present

## 2015-06-26 DIAGNOSIS — I5042 Chronic combined systolic (congestive) and diastolic (congestive) heart failure: Secondary | ICD-10-CM

## 2015-06-29 NOTE — Progress Notes (Signed)
Remote ICD transmission.   

## 2015-07-01 LAB — CUP PACEART REMOTE DEVICE CHECK
Battery Voltage: 3.03 V
Brady Statistic RV Percent Paced: 0.02 %
HIGH POWER IMPEDANCE MEASURED VALUE: 69 Ohm
HighPow Impedance: 380 Ohm
Lead Channel Pacing Threshold Pulse Width: 0.4 ms
Lead Channel Sensing Intrinsic Amplitude: 27.125 mV
Lead Channel Setting Pacing Amplitude: 2.5 V
Lead Channel Setting Pacing Pulse Width: 0.4 ms
Lead Channel Setting Sensing Sensitivity: 0.3 mV
MDC IDC MSMT LEADCHNL RV IMPEDANCE VALUE: 437 Ohm
MDC IDC MSMT LEADCHNL RV PACING THRESHOLD AMPLITUDE: 0.875 V
MDC IDC SESS DTM: 20160715221122
Zone Setting Detection Interval: 280 ms
Zone Setting Detection Interval: 360 ms
Zone Setting Detection Interval: 400 ms

## 2015-07-20 ENCOUNTER — Emergency Department (HOSPITAL_COMMUNITY): Payer: Medicare Other

## 2015-07-20 ENCOUNTER — Encounter (HOSPITAL_COMMUNITY): Payer: Self-pay

## 2015-07-20 ENCOUNTER — Observation Stay (HOSPITAL_COMMUNITY)
Admission: EM | Admit: 2015-07-20 | Discharge: 2015-07-23 | Disposition: A | Discharge status: Home / Self Care | Payer: Medicare Other | Attending: Oncology | Admitting: Oncology

## 2015-07-20 DIAGNOSIS — I252 Old myocardial infarction: Secondary | ICD-10-CM | POA: Diagnosis not present

## 2015-07-20 DIAGNOSIS — E1151 Type 2 diabetes mellitus with diabetic peripheral angiopathy without gangrene: Secondary | ICD-10-CM | POA: Insufficient documentation

## 2015-07-20 DIAGNOSIS — Z79891 Long term (current) use of opiate analgesic: Secondary | ICD-10-CM | POA: Diagnosis not present

## 2015-07-20 DIAGNOSIS — Z951 Presence of aortocoronary bypass graft: Secondary | ICD-10-CM | POA: Insufficient documentation

## 2015-07-20 DIAGNOSIS — C160 Malignant neoplasm of cardia: Secondary | ICD-10-CM | POA: Insufficient documentation

## 2015-07-20 DIAGNOSIS — Z87891 Personal history of nicotine dependence: Secondary | ICD-10-CM | POA: Insufficient documentation

## 2015-07-20 DIAGNOSIS — I739 Peripheral vascular disease, unspecified: Secondary | ICD-10-CM | POA: Diagnosis present

## 2015-07-20 DIAGNOSIS — Z6834 Body mass index (BMI) 34.0-34.9, adult: Secondary | ICD-10-CM | POA: Insufficient documentation

## 2015-07-20 DIAGNOSIS — R079 Chest pain, unspecified: Secondary | ICD-10-CM | POA: Diagnosis present

## 2015-07-20 DIAGNOSIS — C155 Malignant neoplasm of lower third of esophagus: Secondary | ICD-10-CM | POA: Diagnosis not present

## 2015-07-20 DIAGNOSIS — M109 Gout, unspecified: Secondary | ICD-10-CM | POA: Insufficient documentation

## 2015-07-20 DIAGNOSIS — I5042 Chronic combined systolic (congestive) and diastolic (congestive) heart failure: Secondary | ICD-10-CM | POA: Diagnosis not present

## 2015-07-20 DIAGNOSIS — Z7982 Long term (current) use of aspirin: Secondary | ICD-10-CM | POA: Diagnosis not present

## 2015-07-20 DIAGNOSIS — K228 Other specified diseases of esophagus: Secondary | ICD-10-CM

## 2015-07-20 DIAGNOSIS — K2289 Other specified disease of esophagus: Secondary | ICD-10-CM

## 2015-07-20 DIAGNOSIS — I10 Essential (primary) hypertension: Secondary | ICD-10-CM | POA: Diagnosis not present

## 2015-07-20 DIAGNOSIS — E785 Hyperlipidemia, unspecified: Secondary | ICD-10-CM | POA: Insufficient documentation

## 2015-07-20 DIAGNOSIS — C799 Secondary malignant neoplasm of unspecified site: Secondary | ICD-10-CM

## 2015-07-20 DIAGNOSIS — R599 Enlarged lymph nodes, unspecified: Secondary | ICD-10-CM | POA: Diagnosis not present

## 2015-07-20 DIAGNOSIS — K219 Gastro-esophageal reflux disease without esophagitis: Secondary | ICD-10-CM | POA: Insufficient documentation

## 2015-07-20 DIAGNOSIS — R0789 Other chest pain: Principal | ICD-10-CM | POA: Insufficient documentation

## 2015-07-20 DIAGNOSIS — R0602 Shortness of breath: Secondary | ICD-10-CM

## 2015-07-20 DIAGNOSIS — E669 Obesity, unspecified: Secondary | ICD-10-CM | POA: Diagnosis not present

## 2015-07-20 DIAGNOSIS — K259 Gastric ulcer, unspecified as acute or chronic, without hemorrhage or perforation: Secondary | ICD-10-CM | POA: Diagnosis present

## 2015-07-20 DIAGNOSIS — Z794 Long term (current) use of insulin: Secondary | ICD-10-CM | POA: Diagnosis not present

## 2015-07-20 DIAGNOSIS — I2510 Atherosclerotic heart disease of native coronary artery without angina pectoris: Secondary | ICD-10-CM | POA: Diagnosis not present

## 2015-07-20 DIAGNOSIS — G4733 Obstructive sleep apnea (adult) (pediatric): Secondary | ICD-10-CM | POA: Diagnosis not present

## 2015-07-20 DIAGNOSIS — I2582 Chronic total occlusion of coronary artery: Secondary | ICD-10-CM | POA: Insufficient documentation

## 2015-07-20 DIAGNOSIS — J45909 Unspecified asthma, uncomplicated: Secondary | ICD-10-CM | POA: Diagnosis not present

## 2015-07-20 DIAGNOSIS — E119 Type 2 diabetes mellitus without complications: Secondary | ICD-10-CM

## 2015-07-20 DIAGNOSIS — Z95810 Presence of automatic (implantable) cardiac defibrillator: Secondary | ICD-10-CM | POA: Insufficient documentation

## 2015-07-20 LAB — BASIC METABOLIC PANEL
ANION GAP: 8 (ref 5–15)
BUN: 11 mg/dL (ref 6–20)
CALCIUM: 8.9 mg/dL (ref 8.9–10.3)
CHLORIDE: 103 mmol/L (ref 101–111)
CO2: 25 mmol/L (ref 22–32)
Creatinine, Ser: 1.08 mg/dL (ref 0.61–1.24)
GFR calc non Af Amer: >60 mL/min (ref >60)
GLUCOSE: 211 mg/dL — AB (ref 65–99)
POTASSIUM: 4.3 mmol/L (ref 3.5–5.1)
Sodium: 136 mmol/L (ref 135–145)

## 2015-07-20 LAB — HEPATIC FUNCTION PANEL
ALT: 12 U/L — AB (ref 17–63)
AST: 27 U/L (ref 15–41)
Albumin: 3.9 g/dL (ref 3.5–5.0)
Alkaline Phosphatase: 127 U/L — ABNORMAL HIGH (ref 38–126)
Bilirubin, Direct: 0.2 mg/dL (ref 0.1–0.5)
Indirect Bilirubin: 0.9 mg/dL (ref 0.3–0.9)
TOTAL PROTEIN: 7.8 g/dL (ref 6.5–8.1)
Total Bilirubin: 1.1 mg/dL (ref 0.3–1.2)

## 2015-07-20 LAB — I-STAT TROPONIN, ED: Troponin i, poc: 0.01 ng/mL (ref 0.00–0.08)

## 2015-07-20 LAB — PROTIME-INR
INR: 1.05 (ref 0.00–1.49)
PROTHROMBIN TIME: 13.9 s (ref 11.6–15.2)

## 2015-07-20 LAB — BRAIN NATRIURETIC PEPTIDE: B Natriuretic Peptide: 217.8 pg/mL — ABNORMAL HIGH (ref 0.0–100.0)

## 2015-07-20 LAB — CBG MONITORING, ED: Glucose-Capillary: 155 mg/dL — ABNORMAL HIGH (ref 65–99)

## 2015-07-20 LAB — CBC
HEMATOCRIT: 39.2 % (ref 39.0–52.0)
HEMOGLOBIN: 13.3 g/dL (ref 13.0–17.0)
MCH: 28.7 pg (ref 26.0–34.0)
MCHC: 33.9 g/dL (ref 30.0–36.0)
MCV: 84.5 fL (ref 78.0–100.0)
Platelets: 160 10*3/uL (ref 150–400)
RBC: 4.64 MIL/uL (ref 4.22–5.81)
RDW: 14 % (ref 11.5–15.5)
WBC: 9.1 10*3/uL (ref 4.0–10.5)

## 2015-07-20 LAB — TROPONIN I
Troponin I: <0.03 ng/mL (ref <0.031)
Troponin I: <0.03 ng/mL (ref <0.031)

## 2015-07-20 LAB — GLUCOSE, CAPILLARY
GLUCOSE-CAPILLARY: 149 mg/dL — AB (ref 65–99)
Glucose-Capillary: 83 mg/dL (ref 65–99)

## 2015-07-20 MED ORDER — FUROSEMIDE 10 MG/ML IJ SOLN
40.0000 mg | Freq: Once | INTRAMUSCULAR | Status: AC
  Administered 2015-07-20: 40 mg via INTRAVENOUS
  Filled 2015-07-20: qty 4

## 2015-07-20 MED ORDER — ATORVASTATIN CALCIUM 40 MG PO TABS
40.0000 mg | ORAL_TABLET | Freq: Every day | ORAL | Status: DC
  Administered 2015-07-20 – 2015-07-23 (×4): 40 mg via ORAL
  Filled 2015-07-20 (×5): qty 1

## 2015-07-20 MED ORDER — INSULIN DETEMIR 100 UNIT/ML ~~LOC~~ SOLN
40.0000 [IU] | Freq: Two times a day (BID) | SUBCUTANEOUS | Status: DC
  Administered 2015-07-20 – 2015-07-23 (×4): 40 [IU] via SUBCUTANEOUS
  Filled 2015-07-20 (×8): qty 0.4

## 2015-07-20 MED ORDER — GI COCKTAIL ~~LOC~~
30.0000 mL | Freq: Once | ORAL | Status: AC
  Administered 2015-07-20: 30 mL via ORAL
  Filled 2015-07-20: qty 30

## 2015-07-20 MED ORDER — ALLOPURINOL 300 MG PO TABS
300.0000 mg | ORAL_TABLET | Freq: Every day | ORAL | Status: DC
  Administered 2015-07-20 – 2015-07-23 (×4): 300 mg via ORAL
  Filled 2015-07-20 (×3): qty 1
  Filled 2015-07-20: qty 3

## 2015-07-20 MED ORDER — FAMOTIDINE 20 MG PO TABS
20.0000 mg | ORAL_TABLET | Freq: Two times a day (BID) | ORAL | Status: DC
  Administered 2015-07-20 – 2015-07-23 (×7): 20 mg via ORAL
  Filled 2015-07-20 (×7): qty 1

## 2015-07-20 MED ORDER — FENTANYL CITRATE (PF) 100 MCG/2ML IJ SOLN
50.0000 ug | Freq: Once | INTRAMUSCULAR | Status: AC
  Administered 2015-07-20: 50 ug via INTRAVENOUS
  Filled 2015-07-20: qty 2

## 2015-07-20 MED ORDER — MORPHINE SULFATE 4 MG/ML IJ SOLN
4.0000 mg | Freq: Once | INTRAMUSCULAR | Status: DC
  Filled 2015-07-20: qty 1

## 2015-07-20 MED ORDER — NITROGLYCERIN 0.4 MG SL SUBL
0.4000 mg | SUBLINGUAL_TABLET | SUBLINGUAL | Status: DC | PRN
  Administered 2015-07-20 (×2): 0.4 mg via SUBLINGUAL

## 2015-07-20 MED ORDER — FENTANYL CITRATE (PF) 100 MCG/2ML IJ SOLN
100.0000 ug | Freq: Once | INTRAMUSCULAR | Status: AC
Start: 2015-07-20 — End: 2015-07-20
  Administered 2015-07-20: 100 ug via INTRAVENOUS
  Filled 2015-07-20: qty 2

## 2015-07-20 MED ORDER — LEVOTHYROXINE SODIUM 75 MCG PO TABS
75.0000 ug | ORAL_TABLET | Freq: Every day | ORAL | Status: DC
  Administered 2015-07-20 – 2015-07-23 (×4): 75 ug via ORAL
  Filled 2015-07-20 (×5): qty 1

## 2015-07-20 MED ORDER — ENOXAPARIN SODIUM 40 MG/0.4ML ~~LOC~~ SOLN
40.0000 mg | SUBCUTANEOUS | Status: DC
  Administered 2015-07-20: 40 mg via SUBCUTANEOUS
  Filled 2015-07-20 (×2): qty 0.4

## 2015-07-20 MED ORDER — ASPIRIN 81 MG PO CHEW
81.0000 mg | CHEWABLE_TABLET | Freq: Every day | ORAL | Status: DC
  Administered 2015-07-20 – 2015-07-23 (×4): 81 mg via ORAL
  Filled 2015-07-20 (×4): qty 1

## 2015-07-20 MED ORDER — IRBESARTAN 150 MG PO TABS
150.0000 mg | ORAL_TABLET | Freq: Every day | ORAL | Status: DC
  Administered 2015-07-20 – 2015-07-23 (×3): 150 mg via ORAL
  Filled 2015-07-20 (×3): qty 1

## 2015-07-20 MED ORDER — FENTANYL CITRATE (PF) 100 MCG/2ML IJ SOLN
100.0000 ug | Freq: Once | INTRAMUSCULAR | Status: AC
  Administered 2015-07-20: 100 ug via INTRAVENOUS
  Filled 2015-07-20: qty 2

## 2015-07-20 MED ORDER — IPRATROPIUM-ALBUTEROL 0.5-2.5 (3) MG/3ML IN SOLN
3.0000 mL | RESPIRATORY_TRACT | Status: DC
  Administered 2015-07-20: 3 mL via RESPIRATORY_TRACT
  Filled 2015-07-20: qty 3

## 2015-07-20 MED ORDER — INSULIN ASPART 100 UNIT/ML ~~LOC~~ SOLN
0.0000 [IU] | Freq: Three times a day (TID) | SUBCUTANEOUS | Status: DC
  Administered 2015-07-20 – 2015-07-21 (×3): 3 [IU] via SUBCUTANEOUS
  Administered 2015-07-22: 2 [IU] via SUBCUTANEOUS
  Administered 2015-07-23: 3 [IU] via SUBCUTANEOUS
  Filled 2015-07-20: qty 1

## 2015-07-20 MED ORDER — METOPROLOL SUCCINATE ER 100 MG PO TB24
100.0000 mg | ORAL_TABLET | Freq: Every day | ORAL | Status: DC
  Administered 2015-07-20 – 2015-07-23 (×4): 100 mg via ORAL
  Filled 2015-07-20: qty 1
  Filled 2015-07-20: qty 4
  Filled 2015-07-20 (×2): qty 1

## 2015-07-20 MED ORDER — HYDROMORPHONE HCL 1 MG/ML IJ SOLN
0.5000 mg | Freq: Once | INTRAMUSCULAR | Status: AC
  Administered 2015-07-20: 0.5 mg via INTRAVENOUS
  Filled 2015-07-20: qty 1

## 2015-07-20 MED ORDER — IOHEXOL 350 MG/ML SOLN
100.0000 mL | Freq: Once | INTRAVENOUS | Status: AC | PRN
  Administered 2015-07-20: 100 mL via INTRAVENOUS

## 2015-07-20 MED ORDER — SODIUM CHLORIDE 0.9 % IJ SOLN
3.0000 mL | Freq: Two times a day (BID) | INTRAMUSCULAR | Status: DC
  Administered 2015-07-20 – 2015-07-23 (×5): 3 mL via INTRAVENOUS

## 2015-07-20 NOTE — ED Notes (Signed)
MD made aware that pt reports continued CP. Reports will order pain meds and will try for 3 w.

## 2015-07-20 NOTE — H&P (Signed)
Date: 07/20/2015               Patient Name:  Walter French MRN: [id number]  DOB: March 07, 1946 Age / Sex: 69 y.o., male   PCP: Karlene Einstein, MD         Medical Service: Internal Medicine Teaching Service         Attending Physician: Dr. Evelina Bucy, MD    First Contact: Dr. Frances Furbish Pager: [phone]  Second Contact: Dr. Heber  Pager: [phone]       After Hours (After 5p/  First Contact Pager: [phone]  weekends / holidays): Second Contact Pager: [phone]   Chief Complaint: Chest pain.  History of Present Illness: 5 y o m with PMH of 3 vessel CABG- 1997, 2007- redo and then subsequent internal mammary artery anastomosis to LAD - 2008, CHF- with ICD implantation, PAD- s/p stent placement 2014, DM2, HLD, HTN, OSA. Presented with c/o Chest pain that started about 3-4 days ago, was present mildly when he is going to bed, but over the past 2 days, he has woken up suddenly from sleep at about 3.30am, with pain in his upper abdomen, bilaterally, also involving his chest bilaterally, and in the ED he reported pain in his Upper shoulders- left more so than right. He denies associated SOB, nausea, diaphoresis. He cannot tell if the pain is worse with breathing, or with exertion, no aggravating  Factors, but wasn pain was relived by fentanyl given in the ED. He has a PRN prescription for Nitroglycerin which he did not use, because he felt this pain was similar to pain he had in the past when he was told he had fluid in his lungs. When he had CABG and stents he never had preceeding chest pain. He takes NTG for as needed chest pain, and this helps. He has been compliant with his daily aspirin, metoprolol, and Valsartan.  He has stable 2 pillow orthopnea that has not changed, he has stable dyspnea on exertion, that has not changed, patient denies any change in bowel habits, vomiting, dark stools, or fever. He saw his PCP over the past week for nasal congestion, he felt he was having Pneumonia, he was  prescribed some antibiotics - ?names, has 3 pills, left, he still has some nasal congestion.  Pt does not take alcohol, quit ~22yrs ago, quit smoking cigarettes- 1997.  Meds: Current Facility-Administered Medications  Medication Dose Route Frequency Provider Last Rate Last Dose  . ipratropium-albuterol (DUONEB) 0.5-2.5 (3) MG/3ML nebulizer solution 3 mL  3 mL Nebulization Q4H Comer Locket, PA-C   3 mL at 07/20/15 2774   Current Outpatient Prescriptions  Medication Sig Dispense Refill  . allopurinol (ZYLOPRIM) 300 MG tablet Take 300 mg by mouth daily.      [name] aspirin 325 MG tablet Take 325 mg by mouth daily.    [name] atorvastatin (LIPITOR) 40 MG tablet Take 1 tablet (40 mg total) by mouth daily. 90 tablet 3  . glipiZIDE (GLUCOTROL) 10 MG tablet Take 10 mg by mouth 2 (two) times daily before a meal.     . HYDROcodone-acetaminophen (NORCO/VICODIN) 5-325 MG per tablet Take 2 tablets by mouth every 8 (eight) hours as needed. For pain    . insulin detemir (LEVEMIR) 100 UNIT/ML injection Inject 85 Units into the skin 2 (two) times daily.     [name] levothyroxine (SYNTHROID, LEVOTHROID) 75 MCG tablet Take 75 mcg by mouth daily before breakfast.   0  . Liraglutide (VICTOZA) 18 MG/3ML SOPN Inject  1.8 mg into the skin daily.    . metoprolol succinate (TOPROL-XL) 100 MG 24 hr tablet Take 1 tablet (100 mg total) by mouth daily. Take with or immediately following a meal. 30 tablet 6  . NOVOLOG FLEXPEN 100 UNIT/ML FlexPen Inject 20 Units as directed daily.   0  . valsartan (DIOVAN) 160 MG tablet Take 1 tablet (160 mg total) by mouth daily. 30 tablet 11  . nitroGLYCERIN (NITROSTAT) 0.4 MG SL tablet Place 1 tablet (0.4 mg total) under the tongue every 5 (five) minutes x 3 doses as needed for chest pain. 25 tablet 3  . ranitidine (ZANTAC) 300 MG tablet Take 1 tablet (300 mg total) by mouth daily as needed for heartburn. (Patient not taking: Reported on 07/20/2015) 30 tablet 6    Allergies: Allergies as of 07/20/2015  - Review Complete 07/20/2015  Allergen Reaction Noted  . Adhesive [tape]  09/20/2013  . Morphine and related Nausea And Vomiting 06/09/2011   Past Medical History  Diagnosis Date  . Hyperlipidemia   . Hypertension   . Coronary heart disease     a. s/p CABG x 3 (VG->PDA, VG->PL, LIMA->LAD);  b. 2013 Abnl Myoview;  c. 2013 Cath: 3/3 patent grafts, occluded LCX which correlated w/ ischemia on MV->not amenable to PCI->Med Rx.  . Obesity   . CHF (congestive heart failure)   . VT (ventricular tachycardia)   . SVT (supraventricular tachycardia)   . PAD (peripheral artery disease)     Angiography in February of 2014: Moderate left iliac artery stenosis, mild to moderate diffuse right SFA disease. Severe proximal right popliteal artery stenosis with three-vessel runoff below the knee. Status post self-expanding stent placement to the proximal popliteal artery  . Implantable cardioverter-defibrillator Mdt   . Asthma     "small touch" (07/29/2013)  . History of pneumonia     "3-4 times; last time ~ 2003" (07/29/2013)  . OSA on CPAP   . Type II diabetes mellitus     on insulin  . GERD (gastroesophageal reflux disease)   . H/O hiatal hernia   . Gout   . Anxiety    Past Surgical History  Procedure Laterality Date  . Tonsillectomy    . Cardiac catheterization    . Wrist fracture surgery Right   . Cataract extraction w/ intraocular lens implant Left   . Cardiac defibrillator placement  1997; ~ 2003  . Coronary artery bypass graft  1997; 2007    "X3" (07/29/2013)  . Nerve, tendon and artery repair Left 09/20/2013    Procedure: IRRIGATION AND DEBRIDEMENT,Exploration and Repair of Ulnar/Digital  Artery Nerve of Left Index finger;  Surgeon: Roseanne Kaufman, MD;  Location: [facility];  Service: Orthopedics;  Laterality: Left;  . Abdominal aortagram N/A 01/30/2013    Procedure: ABDOMINAL Maxcine Ham;  Surgeon: Wellington Hampshire, MD;  Location: [facility] CATH LAB;  Service: Cardiovascular;  Laterality: N/A;    Family History  Problem Relation Age of Onset  . Heart disease Brother   . Cancer Brother   . Breast cancer Mother    History   Social History  . Marital Status: Single    Spouse Name: N/A  . Number of Children: 0  . Years of Education: N/A   Occupational History  . retired Surveyor, mining   .     Social History Main Topics  . Smoking status: Former Smoker -- 2.00 packs/day for 30 years    Types: Cigarettes    Quit date: 12/13/1995  . Smokeless  tobacco: Never Used  . Alcohol Use: No     Comment: 07/29/2013 "not touched any alcohol since 1997"  . Drug Use: No  . Sexual Activity: Not Currently   Other Topics Concern  . Not on file   Social History Narrative   Review of Systems: CONSTITUTIONAL- No Fever, weightloss, night sweat or change in appetite. SKIN- No Rash, colour changes or itching. RESPIRATORY- No Cough or SOB. GI- No nausea, vomiting, diarrhoea, constipation, abd pain. URINARY- No Frequency, or dysuria.  Physical Exam: Blood pressure 143/87, pulse 78, temperature 97.7 F (36.5 C), temperature source Oral, resp. rate 19, SpO2 96 %. GENERAL- Pleasant , alert, co-operative, appears as stated age, not in any distress. HEENT- Atraumatic, normocephalic, PERRL, EOMI, oral mucosa appears moist, neck supple. CARDIAC- RRR, no murmurs, rubs or gallops, no tenderness to palpation, pain not reproducible. RESP- Moving equal volumes of air, and clear to auscultation bilaterally, no wheezes or crackles. ABDOMEN- Soft, nontender, no guarding or rebound, bowel sounds present. BACK- Normal curvature of the spine, No tenderness along the vertebrae, no CVA tenderness. NEURO- No obvious Cr N abnormality. EXTREMITIES- pulse 2+, symmetric, minimal pedal edema. SKIN- Warm, dry, No rash or lesion. PSYCH- Normal mood and affect, appropriate thought content and speech.  Lab results: Basic Metabolic Panel:  Recent Labs  07/20/15 0512  NA 136  K 4.3  CL 103  CO2 25   GLUCOSE 211*  BUN 11  CREATININE 1.08  CALCIUM 8.9   CBC:  Recent Labs  07/20/15 0512  WBC 9.1  HGB 13.3  HCT 39.2  MCV 84.5  PLT 160   Coagulation:  Recent Labs  07/20/15 0512  LABPROT 13.9  INR 1.05   Imaging results:  Dg Chest 2 View  07/20/2015   CLINICAL DATA:  Acute onset of shortness of breath and generalized chest pain. Initial encounter.  EXAM: CHEST  2 VIEW  COMPARISON:  Chest radiograph performed 09/20/2013  FINDINGS: The lungs are well-aerated. Mild vascular congestion is noted, with mildly increased interstitial markings, raising question for mild interstitial edema. There is no evidence of pleural effusion or pneumothorax.  The heart is mildly enlarged. The patient is status post median sternotomy. Multiple fractured sternal wires are seen. An AICD is noted at the left chest wall with a single lead ending at the right ventricle. No acute osseous abnormalities are identified.  IMPRESSION: Mild vascular congestion and mild cardiomegaly, with slightly increased interstitial markings, raising concern for mild interstitial edema.   Electronically Signed   By: Garald Balding M.D.   On: 07/20/2015 06:17   Ct Angio Chest Aorta W/cm &/or Wo/cm  07/20/2015   CLINICAL DATA:  Diffuse chest pain  EXAM: CT ANGIOGRAPHY CHEST WITH CONTRAST  TECHNIQUE: Multidetector CT imaging of the chest was performed using the standard protocol during bolus administration of intravenous contrast. Multiplanar CT image reconstructions and MIPs were obtained to evaluate the vascular anatomy.  CONTRAST:  182mL OMNIPAQUE IOHEXOL 350 MG/ML SOLN  COMPARISON:  None.  FINDINGS: Mediastinum: The heart size appears moderately enlarged. There is no pericardial effusion identified. Status post median sternotomy and CABG procedure. Nonunion of sternotomy defect noted. There is abnormal wall thickening involving the distal esophagus. There are prominent lymph nodes surrounding the distal esophagus. Index node on the  right measures 1.6 cm, image 80/series 401. Just below the diaphragm there is a gastrohepatic ligament node measuring 1.3 cm, image 92 of series 401. The trachea appears patent and is midline.  The main pulmonary artery  appears normal. No lobar or segmental pulmonary artery filling defects identified. There is no lobar or segmental pulmonary artery filling defects to suggest a clinically significant acute pulmonary embolus.  Lungs/Pleura: No pleural effusion identified. No airspace consolidation. Calcified granuloma is identified within the right upper lobe, image 32 of series 402. There is no suspicious pulmonary nodule or mass identified.  Upper Abdomen: The visualized portions of the liver are unremarkable. The visualized spleen is normal.  Musculoskeletal: Review of the visualized bony structures is negative for aggressive lytic or sclerotic bone lesion.  Review of the MIP images confirms the above findings.  IMPRESSION: 1. No evidence for an acute pulmonary embolus. 2. Abnormal wall thickening involving the distal esophagus. Additionally, there are several adjacent pathologically enlarged lymph nodes. This is a worrisome finding in further investigation with upper endoscopy is advise. 3. 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   Electronically Signed   By: Kerby Moors M.D.   On: 07/20/2015 08:49    Other results: EKG: Rate- 80bpm, regular, sinus rhythm, slightly prolonged PR interval 221, QRS- 107, QTc- 473, No ST segment changes, T wave flattening in AVL, and V6. Mild changes Compared to old EKGs- T wave inversion in AVL and upright T wave in V6.  Assessment & Plan by Problem: Principal Problem:   Chest pain Active Problems:   Diabetes mellitus, type 2   Hyperlipidemia   Essential hypertension   Obstructive sleep apnea   Coronary artery disease-status post CABG   Automatic implantable cardioverter-defibrillator in situ   Systolic and diastolic CHF, chronic   PAD  (peripheral artery disease)  Chest pain- patient with significant risk factors for cardiac disease- Age, sex, prior hx- CABG- 1997 and then redone 2007, Stent placement, PAD, DM, HTN. Has been following up with his cardiologist. Last CATH- 07/2012- revealed- Severe three vessel obstructive coronary artery disease, patent grafts, but with moderate to severe stenosis at the anastomotic sites of the LIMA to LAD. Cath was done due to patients increasing dyspnea, and prior Myoview showed apical infarct.  Notes from 07/2012 noted that pt has a left circumflex occlusion, not amenable to revascularization, and corresponds to area of ischemia noted on stress myoview. Medical management was therefore recommended. He has been compliant with his aspirin 325mg  daily, lipitor, metoprolol, and ARBs.  - Considering patients risk factors, admit to [facility] for ACS rule out and consulting Cardiology.  - Troponins x3 - EKG am - Cont meds- Metop, ARBs and Lipitor - Home med listed Aspirin 325mg  daily, but last cardiology visit lists aspirin 81mg , will Cont Aspirin 81mg  daily. - NPO from mid night. - LFTs to ensure no liver involvement/etiology for cardiac symptoms  Chronic CHF s/p ICD- Noted per myoview- Ef- 2013- 32%, ventriculogram- 2013- Ef- 40-45%. Pt has chronic dyspnea on exertion, chronic stable orthopnea. Weight appears stable at 256, no overt signs of fluid overload. No echo on file. Not on diuretics. Chest xray shows some mild interstitial edema. ICD placed [place] and [place]. - Consider ECHO  Diabetes- Pt sees an endocrinologist. Pt says he takes- meds- Levemir- 90u BID, not 85u, also takes Novolog- 30u daily, not 20u daily. No HgbA1c on file. He is also on Glipizide 10mg  BID and Liraglutide- 1.8mg  daily. - SSI-M - levemir- 40u BID  HTN- BP stable on home meds- metoprolol, and Valsartan. - Cont for now  Abnormal CT angio- revealed- abnormal wall thickening distal esophagus,, with several  pathologically appering Lymph nodes. Pt says  he used to have difficulty swallowing in the past, but was given a medication and this is no longer an issue. His weight is stable. Pt is on zantac for GERD, he might have chronic GERD, which would put him at risk for barretts and subsequent malig.  - Follow up with Endoscopy as an outpt.  DVT ppx- Lovenox.  Dispo: Disposition is deferred at this time, awaiting improvement of current medical problems.   The patient does have a current PCP Karlene Einstein, MD) and does need an [facility] hospital follow-up appointment after discharge.  The patient does not know have transportation limitations that hinder transportation to clinic appointments.  Signed: Bethena Roys, MD 07/20/2015, 10:57 AM

## 2015-07-20 NOTE — ED Notes (Signed)
Patient transported to CT 

## 2015-07-20 NOTE — ED Notes (Signed)
Attempted report 

## 2015-07-20 NOTE — ED Provider Notes (Signed)
CSN: [id number]     Arrival date & time 07/20/15  0508 History   First MD Initiated Contact with Patient 07/20/15 [phone]     Chief Complaint  Patient presents with  . Chest Pain     (Consider location/radiation/quality/duration/timing/severity/associated sxs/prior Treatment) HPI Walter French is a 69 y.o. male with known coronary artery disease status post CABG 3, ICD, CHF, DVT, SVT, PAD, comes in for evaluation of chest pain. Patient states he has been woken up by chest pain the past 2 mornings in a row. He reports waking up at approximately 3:30 AM with chest discomfort similar to "when I had to come in for fluid on my lungs". Discomfort lasts for 3 or 4 hours and resolves on its own, spans across his chest and radiates through to his back. Although, He reports this discomfort has been present for the past week. He reports being seen by his PCP and prescribed an inhaler and antibiotics. He reports he has been compliant with his medications and has 3 antibiotic pills left, is unsure what the antibiotic is. Nothing seems to make his chest discomfort better or worse. He did not take his nitroglycerin. He also reports associated shortness of breath, but states that this is baseline for him. He denies fevers, chills, cough, leg swelling, hemoptysis, nausea or vomiting, diaphoresis, numbness or weakness. Rates his discomfort a 7/10.  Past Medical History  Diagnosis Date  . Hyperlipidemia   . Hypertension   . Coronary heart disease     a. s/p CABG x 3 (VG->PDA, VG->PL, LIMA->LAD);  b. 2013 Abnl Myoview;  c. 2013 Cath: 3/3 patent grafts, occluded LCX which correlated w/ ischemia on MV->not amenable to PCI->Med Rx.  . Obesity   . CHF (congestive heart failure)   . VT (ventricular tachycardia)   . SVT (supraventricular tachycardia)   . PAD (peripheral artery disease)     Angiography in February of 2014: Moderate left iliac artery stenosis, mild to moderate diffuse right SFA disease. Severe proximal  right popliteal artery stenosis with three-vessel runoff below the knee. Status post self-expanding stent placement to the proximal popliteal artery  . Implantable cardioverter-defibrillator Mdt   . Asthma     "small touch" (07/29/2013)  . History of pneumonia     "3-4 times; last time ~ 2003" (07/29/2013)  . OSA on CPAP   . Type II diabetes mellitus     on insulin  . GERD (gastroesophageal reflux disease)   . H/O hiatal hernia   . Gout   . Anxiety    Past Surgical History  Procedure Laterality Date  . Tonsillectomy    . Cardiac catheterization    . Wrist fracture surgery Right   . Cataract extraction w/ intraocular lens implant Left   . Cardiac defibrillator placement  1997; ~ 2003  . Coronary artery bypass graft  1997; 2007    "X3" (07/29/2013)  . Nerve, tendon and artery repair Left 09/20/2013    Procedure: IRRIGATION AND DEBRIDEMENT,Exploration and Repair of Ulnar/Digital  Artery Nerve of Left Index finger;  Surgeon: Roseanne Kaufman, MD;  Location: [place];  Service: Orthopedics;  Laterality: Left;  . Abdominal aortagram N/A 01/30/2013    Procedure: ABDOMINAL Maxcine Ham;  Surgeon: Wellington Hampshire, MD;  Location: [facility] CATH LAB;  Service: Cardiovascular;  Laterality: N/A;   Family History  Problem Relation Age of Onset  . Heart disease Brother   . Cancer Brother   . Breast cancer Mother    History  Substance Use  Topics  . Smoking status: Former Smoker -- 2.00 packs/day for 30 years    Types: Cigarettes    Quit date: 12/13/1995  . Smokeless tobacco: Never Used  . Alcohol Use: No     Comment: 07/29/2013 "not touched any alcohol since 1997"    Review of Systems A 10 point review of systems was completed and was negative except for pertinent positives and negatives as mentioned in the history of present illness     Allergies  Adhesive and Morphine and related  Home Medications   Prior to Admission medications   Medication Sig Start Date End Date Taking? Authorizing  Provider  allopurinol (ZYLOPRIM) 300 MG tablet Take 300 mg by mouth daily.      Historical Provider, MD  aspirin EC 81 MG tablet Take 1 tablet (81 mg total) by mouth daily. 12/19/12   Deboraha Sprang, MD  atorvastatin (LIPITOR) 40 MG tablet Take 1 tablet (40 mg total) by mouth daily. 03/11/15   Deboraha Sprang, MD  fenofibrate 160 MG tablet Take 160 mg by mouth daily.     Historical Provider, MD  glipiZIDE (GLUCOTROL) 10 MG tablet Take 10 mg by mouth daily.  09/18/12   Historical Provider, MD  HYDROcodone-acetaminophen (NORCO/VICODIN) 5-325 MG per tablet Take 2 tablets by mouth every 8 (eight) hours as needed. For pain 06/19/13   Historical Provider, MD  insulin detemir (LEVEMIR) 100 UNIT/ML injection Inject 90 Units into the skin 2 (two) times daily.     Historical Provider, MD  levothyroxine (SYNTHROID, LEVOTHROID) 75 MCG tablet Take 75 mcg by mouth daily before breakfast.  12/06/14   Historical Provider, MD  Liraglutide 18 MG/3ML SOPN Inject 1.2 mg into the skin daily.    Historical Provider, MD  metoprolol succinate (TOPROL-XL) 100 MG 24 hr tablet Take 1 tablet (100 mg total) by mouth daily. Take with or immediately following a meal. 02/16/15   Peter M Martinique, MD  nitroGLYCERIN (NITROSTAT) 0.4 MG SL tablet Place 1 tablet (0.4 mg total) under the tongue every 5 (five) minutes x 3 doses as needed for chest pain. 10/03/14   Peter M Martinique, MD  NOVOLOG FLEXPEN 100 UNIT/ML FlexPen Inject 25 Units as directed daily. 04/05/15   Historical Provider, MD  ranitidine (ZANTAC) 300 MG tablet Take 1 tablet (300 mg total) by mouth daily as needed for heartburn. 02/16/15   Peter M Martinique, MD  valsartan (DIOVAN) 160 MG tablet Take 1 tablet (160 mg total) by mouth daily. 02/25/15   Deboraha Sprang, MD   BP 159/82 mmHg  Pulse 73  Temp(Src) 97.7 F (36.5 C) (Oral)  Resp 19  SpO2 100% Physical Exam  Constitutional: He is oriented to person, place, and time. He appears well-developed and well-nourished.  HENT:  Head:  Normocephalic and atraumatic.  Mouth/Throat: Oropharynx is clear and moist.  Eyes: Conjunctivae are normal. Pupils are equal, round, and reactive to light. Right eye exhibits no discharge. Left eye exhibits no discharge. No scleral icterus.  Neck: Neck supple.  Cardiovascular: Normal rate, regular rhythm and normal heart sounds.   Pulmonary/Chest: Effort normal and breath sounds normal. No respiratory distress.  Patient does appear mildly dyspneic with no accessory muscle use. Maintaining oxygen saturations greater than 98% on room air. Coarse breath sounds and mild wheezing in bilateral lower fields.  Abdominal: Soft. There is no tenderness.  Musculoskeletal: He exhibits no tenderness.  Neurological: He is alert and oriented to person, place, and time.  Cranial Nerves II-XII grossly intact  Skin: Skin is warm and dry. No rash noted.  Psychiatric: He has a normal mood and affect.  Nursing note and vitals reviewed.   ED Course  Procedures (including critical care time) Labs Review Labs Reviewed  [place], ED    Imaging Review Dg Chest 2 View  07/20/2015   CLINICAL DATA:  Acute onset of shortness of breath and generalized chest pain. Initial encounter.  EXAM: CHEST  2 VIEW  COMPARISON:  Chest radiograph performed 09/20/2013  FINDINGS: The lungs are well-aerated. Mild vascular congestion is noted, with mildly increased interstitial markings, raising question for mild interstitial edema. There is no evidence of pleural effusion or pneumothorax.  The heart is mildly enlarged. The patient is status post median sternotomy. Multiple fractured sternal wires are seen. An AICD is noted at the left chest wall with a single lead ending at the right ventricle. No acute osseous abnormalities are identified.  IMPRESSION: Mild vascular congestion and mild cardiomegaly, with slightly increased interstitial markings, raising concern for mild interstitial  edema.   Electronically Signed   By: Garald Balding M.D.   On: 07/20/2015 06:17     EKG Interpretation   Date/Time:  Monday July 20 2015 05:16:07 EDT Ventricular Rate:  80 PR Interval:  221 QRS Duration: 107 QT Interval:  410 QTC Calculation: 473 R Axis:   70 Text Interpretation:  Sinus rhythm Prolonged PR interval Probable left  atrial enlargement Anterior infarct, old No significant change since last  tracing Confirmed by [facility]  MD, [place] [phone]) on 07/20/2015 6:11:16 AM     Meds given in ED:  Medications  fentaNYL (SUBLIMAZE) injection 50 mcg (50 mcg Intravenous Given 07/20/15 0624)    New Prescriptions   No medications on file   Filed Vitals:   07/20/15 0532 07/20/15 0615 07/20/15 0630 07/20/15 0645  BP:      Pulse: 73 76 72 68  Temp:      TempSrc:      Resp: 19 18 20 20   SpO2: 100% 98% 98% 98%    MDM  Vitals stable - WNL -afebrile Pt resting comfortably in ED. reports chest discomfort has improved with fentanyl. PE--course breath sounds with mild wheezing bilaterally in lower lobes. No appreciable peripheral edema. Given Duoneb Labwork--initial troponin negative, EKG is unchanged, BNP 217, labs otherwise noncontributory. Imaging--chest x-ray shows mild vascular congestion with mild cardiomegaly and slightly increased interstitial markings concerning for interstitial edema. CT angiogram chest aorta shows no evidence of PE or dissection. However there are several adjacent pathologically enlarged lymph nodes at the distal esophagus. Given 40 mg IV Lasix for possible CHF exacerbation. Discussed patient presentation and ED course with my attending, Dr. Mingo Amber who also saw and evaluated patient and agrees with plan for medical admission for cardiac rule out.  Consult internal medicine, Dr. Denton Brick will see in the ED. Pt admitted I personally reviewed the imaging and agree with the results as interpreted by the radiologist.   Final diagnoses:  Chest pain, unspecified  chest pain type  SOB (shortness of breath)       Comer Locket, PA-C 07/20/15 [place], MD 07/20/15 [phone]

## 2015-07-20 NOTE — ED Notes (Signed)
Pt drove self here c/o chest pain for the last week with SOB.  Pt states the pain woke him up.

## 2015-07-20 NOTE — ED Notes (Signed)
Admitting team at bedside to talk with patient. Pt denies pain at current. Is a x 4.

## 2015-07-20 NOTE — ED Notes (Signed)
Admitting MD at bedside assessing patient.

## 2015-07-21 ENCOUNTER — Other Ambulatory Visit (HOSPITAL_COMMUNITY): Payer: Medicare Other

## 2015-07-21 ENCOUNTER — Observation Stay (HOSPITAL_COMMUNITY): Payer: Medicare Other

## 2015-07-21 DIAGNOSIS — I2510 Atherosclerotic heart disease of native coronary artery without angina pectoris: Secondary | ICD-10-CM | POA: Diagnosis not present

## 2015-07-21 DIAGNOSIS — I5042 Chronic combined systolic (congestive) and diastolic (congestive) heart failure: Secondary | ICD-10-CM

## 2015-07-21 DIAGNOSIS — I10 Essential (primary) hypertension: Secondary | ICD-10-CM | POA: Diagnosis not present

## 2015-07-21 DIAGNOSIS — E119 Type 2 diabetes mellitus without complications: Secondary | ICD-10-CM

## 2015-07-21 DIAGNOSIS — C160 Malignant neoplasm of cardia: Secondary | ICD-10-CM | POA: Diagnosis not present

## 2015-07-21 DIAGNOSIS — R079 Chest pain, unspecified: Secondary | ICD-10-CM

## 2015-07-21 DIAGNOSIS — I509 Heart failure, unspecified: Secondary | ICD-10-CM

## 2015-07-21 DIAGNOSIS — R0789 Other chest pain: Secondary | ICD-10-CM | POA: Diagnosis not present

## 2015-07-21 DIAGNOSIS — R590 Localized enlarged lymph nodes: Secondary | ICD-10-CM | POA: Diagnosis not present

## 2015-07-21 DIAGNOSIS — E785 Hyperlipidemia, unspecified: Secondary | ICD-10-CM | POA: Diagnosis not present

## 2015-07-21 DIAGNOSIS — J387 Other diseases of larynx: Secondary | ICD-10-CM

## 2015-07-21 DIAGNOSIS — Z794 Long term (current) use of insulin: Secondary | ICD-10-CM

## 2015-07-21 DIAGNOSIS — C155 Malignant neoplasm of lower third of esophagus: Secondary | ICD-10-CM | POA: Diagnosis not present

## 2015-07-21 DIAGNOSIS — Z8719 Personal history of other diseases of the digestive system: Secondary | ICD-10-CM

## 2015-07-21 LAB — NM MYOCAR MULTI W/SPECT W/WALL MOTION / EF
CHL CUP RESTING HR STRESS: 77 {beats}/min
CSEPEW: 1 METS
CSEPHR: 64 %
CSEPPHR: 99 {beats}/min
MPHR: 154 {beats}/min

## 2015-07-21 LAB — GLUCOSE, CAPILLARY
Glucose-Capillary: 159 mg/dL — ABNORMAL HIGH (ref 65–99)
Glucose-Capillary: 103 mg/dL — ABNORMAL HIGH (ref 65–99)
Glucose-Capillary: 172 mg/dL — ABNORMAL HIGH (ref 65–99)
Glucose-Capillary: 182 mg/dL — ABNORMAL HIGH (ref 65–99)

## 2015-07-21 LAB — CBC
HCT: 40.3 % (ref 39.0–52.0)
HEMOGLOBIN: 13.7 g/dL (ref 13.0–17.0)
MCH: 29.1 pg (ref 26.0–34.0)
MCHC: 34 g/dL (ref 30.0–36.0)
MCV: 85.6 fL (ref 78.0–100.0)
Platelets: 141 10*3/uL — ABNORMAL LOW (ref 150–400)
RBC: 4.71 MIL/uL (ref 4.22–5.81)
RDW: 14.1 % (ref 11.5–15.5)
WBC: 9.1 10*3/uL (ref 4.0–10.5)

## 2015-07-21 LAB — TROPONIN I: Troponin I: <0.03 ng/mL (ref <0.031)

## 2015-07-21 LAB — HEMOGLOBIN A1C
HEMOGLOBIN A1C: 6.8 % — AB (ref 4.8–5.6)
MEAN PLASMA GLUCOSE: 148 mg/dL

## 2015-07-21 MED ORDER — REGADENOSON 0.4 MG/5ML IV SOLN
INTRAVENOUS | Status: AC
  Filled 2015-07-21: qty 5

## 2015-07-21 MED ORDER — TECHNETIUM TC 99M SESTAMIBI GENERIC - CARDIOLITE
30.0000 | Freq: Once | INTRAVENOUS | Status: AC | PRN
  Administered 2015-07-21: 30 via INTRAVENOUS

## 2015-07-21 MED ORDER — HYDROCODONE-ACETAMINOPHEN 5-325 MG PO TABS
1.0000 | ORAL_TABLET | Freq: Four times a day (QID) | ORAL | Status: DC | PRN
  Administered 2015-07-21: 1 via ORAL
  Filled 2015-07-21: qty 1

## 2015-07-21 MED ORDER — REGADENOSON 0.4 MG/5ML IV SOLN
0.4000 mg | Freq: Once | INTRAVENOUS | Status: DC
  Filled 2015-07-21: qty 5

## 2015-07-21 MED ORDER — TECHNETIUM TC 99M SESTAMIBI GENERIC - CARDIOLITE
10.0000 | Freq: Once | INTRAVENOUS | Status: AC | PRN
  Administered 2015-07-21: 10 via INTRAVENOUS

## 2015-07-21 MED ORDER — ENOXAPARIN SODIUM 40 MG/0.4ML ~~LOC~~ SOLN: 40.0000 mg | SUBCUTANEOUS | Status: DC

## 2015-07-21 NOTE — Consult Note (Addendum)
Admit date: 07/20/2015 Referring Physician  Dr. Beryle Beams Primary Physician  Dr. Charlcie Cradle Primary Cardiologist  Dr. Peter Martinique Reason for Consultation  Chest pain  HPI: [name] is a 69yo male with a history of CAD with remote CABG with last cath in 2013 with right and left heart cath due to increasing dyspnea and an abnormal Lexiscan. His cath showed severe 3VD, patent SVG to PD, patent SVG to PL, patent LIMA to LAD with a moderate severe stenosis at the anastomosis site and moderate LV dysfunction. He had normal right heart pressures. EF was 40 to 45%. Medical therapy was recommended to be continued at that time. The area of ischemia noted on the stress test correlated with the LCX occlusion. This was deemed not amenable to revascularization. While there was a moderately severe stenosis at the anastomotic site of the IMA graft to the LAD, there was no ischemia noted in this terrority on the Myoview. The apex was scar. He has had prior attempted angioplasty of this lesion with moderate success but it was an extremely difficult procedure.   His other issues include HLD, gout, HTN, VT with ICD in place, OSA, DM and obesity. He has had angiography and stent to the right popliteal artery in Feb. 2014 by Dr. Fletcher Anon. Repeat doppler in December showed continued patency. He reports that his claudication now is really fairly mild.  He has chronic dyspnea that is unchanged.   He presented to the ER with a 3-4 day history of chest pain mainly when going to bed.  Over the past 2 days he has woken up suddenly from sleep around 3:30am with pain in his upper abdomen and into his chest bilaterally.  In the ED he reported pain in his Upper shoulders- left more so than right. He denies associated SOB, nausea, diaphoresis. He cannot tell if the pain is worse with breathing, or with exertion, no aggravatingfactors, but pain was relieved by fentanyl given in the ED.  He has a PRN prescription for Nitroglycerin  which he did not use, because he felt this pain was similar to pain he had in the past when he was told he had fluid in his lungs. When he had CABG and stents he never had preceeding chest pain. He takes NTG for as needed chest pain, and this helps. He has been compliant with his daily aspirin, metoprolol, and Valsartan. He has stable 2 pillow orthopnea that has not changed, he has stable dyspnea on exertion, that has not changed.  He saw his PCP over the past week for nasal congestion and was dx with ? Pneumonia and was prescribed some antibiotics - ?names, has 3 pills, left, he still has some nasal congestion.   In ER initial troponin was normal and BNP mildly elevated.  Cardiology is now asked to consult for further evaluation of CP.        PMH:   Past Medical History  Diagnosis Date  . Hyperlipidemia   . Hypertension   . Coronary heart disease     a. s/p CABG x 3 (VG->PDA, VG->PL, LIMA->LAD);  b. 2013 Abnl Myoview;  c. 2013 Cath: 3/3 patent grafts, occluded LCX which correlated w/ ischemia on MV->not amenable to PCI->Med Rx.  . Obesity   . CHF (congestive heart failure)   . VT (ventricular tachycardia)   . SVT (supraventricular tachycardia)   . PAD (peripheral artery disease)     Angiography in February of 2014: Moderate left iliac artery stenosis, mild  to moderate diffuse right SFA disease. Severe proximal right popliteal artery stenosis with three-vessel runoff below the knee. Status post self-expanding stent placement to the proximal popliteal artery  . Implantable cardioverter-defibrillator Mdt   . Asthma     "small touch" (07/29/2013)  . History of pneumonia     "3-4 times; last time ~ 2003" (07/29/2013)  . OSA on CPAP   . Type II diabetes mellitus     on insulin  . GERD (gastroesophageal reflux disease)   . H/O hiatal hernia   . Gout   . Anxiety      PSH:   Past Surgical History  Procedure Laterality Date  . Tonsillectomy    . Cardiac catheterization    . Wrist  fracture surgery Right   . Cataract extraction w/ intraocular lens implant Left   . Cardiac defibrillator placement  1997; ~ 2003  . Coronary artery bypass graft  1997; 2007    "X3" (07/29/2013)  . Nerve, tendon and artery repair Left 09/20/2013    Procedure: IRRIGATION AND DEBRIDEMENT,Exploration and Repair of Ulnar/Digital  Artery Nerve of Left Index finger;  Surgeon: Roseanne Kaufman, MD;  Location: [place];  Service: Orthopedics;  Laterality: Left;  . Abdominal aortagram N/A 01/30/2013    Procedure: ABDOMINAL Maxcine Ham;  Surgeon: Wellington Hampshire, MD;  Location: [facility] CATH LAB;  Service: Cardiovascular;  Laterality: N/A;    Allergies:  Adhesive and Morphine and related Prior to Admit Meds:   Prescriptions prior to admission  Medication Sig Dispense Refill Last Dose  . allopurinol (ZYLOPRIM) 300 MG tablet Take 300 mg by mouth daily.     07/19/2015 at Unknown time  . aspirin 325 MG tablet Take 325 mg by mouth daily.   07/19/2015 at Unknown time  . atorvastatin (LIPITOR) 40 MG tablet Take 1 tablet (40 mg total) by mouth daily. 90 tablet 3 07/19/2015 at Unknown time  . glipiZIDE (GLUCOTROL) 10 MG tablet Take 10 mg by mouth 2 (two) times daily before a meal.    07/19/2015 at Unknown time  . HYDROcodone-acetaminophen (NORCO/VICODIN) 5-325 MG per tablet Take 2 tablets by mouth every 8 (eight) hours as needed. For pain   07/19/2015 at Unknown time  . insulin detemir (LEVEMIR) 100 UNIT/ML injection Inject 85 Units into the skin 2 (two) times daily.    07/19/2015 at Unknown time  . levothyroxine (SYNTHROID, LEVOTHROID) 75 MCG tablet Take 75 mcg by mouth daily before breakfast.   0 07/19/2015 at Unknown time  . Liraglutide (VICTOZA) 18 MG/3ML SOPN Inject 1.8 mg into the skin daily.   07/19/2015 at Unknown time  . metoprolol succinate (TOPROL-XL) 100 MG 24 hr tablet Take 1 tablet (100 mg total) by mouth daily. Take with or immediately following a meal. 30 tablet 6 07/19/2015 at 700  . NOVOLOG FLEXPEN 100 UNIT/ML FlexPen Inject  20 Units as directed daily.   0 07/19/2015 at Unknown time  . valsartan (DIOVAN) 160 MG tablet Take 1 tablet (160 mg total) by mouth daily. 30 tablet 11 07/19/2015 at Unknown time  . nitroGLYCERIN (NITROSTAT) 0.4 MG SL tablet Place 1 tablet (0.4 mg total) under the tongue every 5 (five) minutes x 3 doses as needed for chest pain. 25 tablet 3 unk  . ranitidine (ZANTAC) 300 MG tablet Take 1 tablet (300 mg total) by mouth daily as needed for heartburn. (Patient not taking: Reported on 07/20/2015) 30 tablet 6 Taking   Fam HX:    Family History  Problem Relation Age of Onset  .  Heart disease Brother   . Cancer Brother   . Breast cancer Mother    Social HX:    History   Social History  . Marital Status: Single    Spouse Name: N/A  . Number of Children: 0  . Years of Education: N/A   Occupational History  . retired Surveyor, mining   .     Social History Main Topics  . Smoking status: Former Smoker -- 2.00 packs/day for 30 years    Types: Cigarettes    Quit date: 12/13/1995  . Smokeless tobacco: Never Used  . Alcohol Use: No     Comment: 07/29/2013 "not touched any alcohol since 1997"  . Drug Use: No  . Sexual Activity: Not Currently   Other Topics Concern  . Not on file   Social History Narrative     ROS:  All 11 ROS were addressed and are negative except what is stated in the HPI  Physical Exam: Blood pressure 140/82, pulse 78, temperature 98.2 F (36.8 C), temperature source Oral, resp. rate 18, height 6' (1.829 m), weight 250 lb 12.8 oz (113.762 kg), SpO2 94 %.    General: Well developed, well nourished, in no acute distress Head: Eyes PERRLA, No xanthomas.   Normal cephalic and atramatic  Lungs:   Clear bilaterally to auscultation and percussion. Heart:   HRRR S1 S2 Pulses are 2+ & equal.            No carotid bruit. No JVD.  No abdominal bruits. No femoral bruits. Abdomen: Bowel sounds are positive, abdomen soft and non-tender without massesExtremities:   No clubbing,  cyanosis or edema.  DP +1 Neuro: Alert and oriented X 3. Psych:  Good affect, responds appropriately    Labs:   Lab Results  Component Value Date   WBC 9.1 07/21/2015   HGB 13.7 07/21/2015   HCT 40.3 07/21/2015   MCV 85.6 07/21/2015   PLT 141* 07/21/2015    Recent Labs Lab 07/20/15 0512 07/20/15 1222  NA 136  --   K 4.3  --   CL 103  --   CO2 25  --   BUN 11  --   CREATININE 1.08  --   CALCIUM 8.9  --   PROT  --  7.8  BILITOT  --  1.1  ALKPHOS  --  127*  ALT  --  12*  AST  --  27  GLUCOSE 211*  --    No results found for: PTT Lab Results  Component Value Date   INR 1.05 07/20/2015   INR 1.00 07/29/2013   INR 1.1* 01/16/2013   Lab Results  Component Value Date   CKTOTAL 86 11/28/2007   CKMB 2.1 11/28/2007   TROPONINI <0.03 07/21/2015     Lab Results  Component Value Date   CHOL 138 07/30/2013   Lab Results  Component Value Date   HDL 25* 07/30/2013   Lab Results  Component Value Date   LDLCALC 49 07/30/2013   Lab Results  Component Value Date   TRIG 321* 07/30/2013   Lab Results  Component Value Date   CHOLHDL 5.5 07/30/2013   No results found for: LDLDIRECT    Radiology:  Dg Chest 2 View  07/20/2015   CLINICAL DATA:  Acute onset of shortness of breath and generalized chest pain. Initial encounter.  EXAM: CHEST  2 VIEW  COMPARISON:  Chest radiograph performed 09/20/2013  FINDINGS: The lungs are well-aerated. Mild vascular congestion is noted, with  mildly increased interstitial markings, raising question for mild interstitial edema. There is no evidence of pleural effusion or pneumothorax.  The heart is mildly enlarged. The patient is status post median sternotomy. Multiple fractured sternal wires are seen. An AICD is noted at the left chest wall with a single lead ending at the right ventricle. No acute osseous abnormalities are identified.  IMPRESSION: Mild vascular congestion and mild cardiomegaly, with slightly increased interstitial markings,  raising concern for mild interstitial edema.   Electronically Signed   By: Garald Balding M.D.   On: 07/20/2015 06:17   Ct Angio Chest Aorta W/cm &/or Wo/cm  07/20/2015   CLINICAL DATA:  Diffuse chest pain  EXAM: CT ANGIOGRAPHY CHEST WITH CONTRAST  TECHNIQUE: Multidetector CT imaging of the chest was performed using the standard protocol during bolus administration of intravenous contrast. Multiplanar CT image reconstructions and MIPs were obtained to evaluate the vascular anatomy.  CONTRAST:  137mL OMNIPAQUE IOHEXOL 350 MG/ML SOLN  COMPARISON:  None.  FINDINGS: Mediastinum: The heart size appears moderately enlarged. There is no pericardial effusion identified. Status post median sternotomy and CABG procedure. Nonunion of sternotomy defect noted. There is abnormal wall thickening involving the distal esophagus. There are prominent lymph nodes surrounding the distal esophagus. Index node on the right measures 1.6 cm, image 80/series 401. Just below the diaphragm there is a gastrohepatic ligament node measuring 1.3 cm, image 92 of series 401. The trachea appears patent and is midline.  The main pulmonary artery appears normal. No lobar or segmental pulmonary artery filling defects identified. There is no lobar or segmental pulmonary artery filling defects to suggest a clinically significant acute pulmonary embolus.  Lungs/Pleura: No pleural effusion identified. No airspace consolidation. Calcified granuloma is identified within the right upper lobe, image 32 of series 402. There is no suspicious pulmonary nodule or mass identified.  Upper Abdomen: The visualized portions of the liver are unremarkable. The visualized spleen is normal.  Musculoskeletal: Review of the visualized bony structures is negative for aggressive lytic or sclerotic bone lesion.  Review of the MIP images confirms the above findings.  IMPRESSION: 1. No evidence for an acute pulmonary embolus. 2. Abnormal wall thickening involving the distal  esophagus. Additionally, there are several adjacent pathologically enlarged lymph nodes. This is a worrisome finding in further investigation with upper endoscopy is advise. 3. 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   Electronically Signed   By: Kerby Moors M.D.   On: 07/20/2015 08:49    EKG:  NSR with anterolateral infarct  ASSESSMENT/PLAN: 1. Chest pain ? Etiology.  He recently had URI symptoms and was placed on antibx for a possible PNA.  Chest xray showed mild edema and mildly elevated BNP.  ? Whether CP is due to CHF or angina.  Last cath showed a moderately severe stenosis at the anastamosis site. He has a known occlusion of the LCX.  Troponin is negative x 3 and EKG with no acute ST changes.  ? Whether there has been progression of CAD at the LIMA -LAD anastamosis.  I will get a Lexiscan myoview to rule out ischemia.  Continue ASA/statin/BB. 2.  Acute on chronic systolic CHF - BNP mildly elevated and chest xray with mild edema.  He has no LE edema on exam and lungs are clear.  Will give one dose of IV Lasix.  He is not on daily Lasix at home.   3.  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  4.  HTN - controlled on BB and ARB 5.  Abnormal CT angio- revealed- abnormal wall thickening distal esophagus, with several pathologically appering Lymph nodes. Pt says he used to have difficulty swallowing in the past, but was given a medication and this is no longer an issue. His weight is stable. Pt is on zantac for GERD, he might have chronic GERD, which would put him at risk for barretts and subsequent malig.    Sueanne Margarita, MD  07/21/2015  8:45 AM

## 2015-07-21 NOTE — Progress Notes (Signed)
Patient presented for Lexiscan. Tolerated procedure well. Result to follow.

## 2015-07-21 NOTE — Progress Notes (Signed)
Pt. Refused cpap. Pt. States he has not gotten use to it yet. RT informed pt. To notify if he changes his mind.

## 2015-07-21 NOTE — Progress Notes (Signed)
Came to see patient today to discuss finding in the esophagus on CT scan and to set up EGD. He was in Nuclear medicine having a test from RN report. We will see him tomorrow and he is tentatively scheduled for EGD tomorrow.  Will keep nothing by mouth after midnight and hold Lovenox in the morning.

## 2015-07-21 NOTE — Progress Notes (Signed)
INTERNAL MEDICINE TEACHING SERVICE Night Float Progress Note   Subjective:    We were called overnight by the RN for evaluation of chest pain. At time of evaluation, pt appeared quite comfortable, no acute distress. Complaining of a band-like pain around the inferior portion of his chest, says this is the same pain he has had for quite some time. No dizziness, lightheadedness, worsening SOB, or palpitations.    Objective:    BP 148/76 mmHg  Pulse 62  Temp(Src) 98.4 F (36.9 C) (Oral)  Resp 18  Ht 6' (1.829 m)  Wt 250 lb 12.8 oz (113.762 kg)  BMI 34.01 kg/m2  SpO2 94%   Labs: Basic Metabolic Panel:    Component Value Date/Time   NA 136 07/20/2015 0512   K 4.3 07/20/2015 0512   CL 103 07/20/2015 0512   CO2 25 07/20/2015 0512   BUN 11 07/20/2015 0512   CREATININE 1.08 07/20/2015 0512   GLUCOSE 211* 07/20/2015 0512   CALCIUM 8.9 07/20/2015 0512    CBC:    Component Value Date/Time   WBC 9.1 07/21/2015 0016   HGB 13.7 07/21/2015 0016   HCT 40.3 07/21/2015 0016   PLT 141* 07/21/2015 0016   MCV 85.6 07/21/2015 0016   NEUTROABS 4.1 09/20/2013 1047   LYMPHSABS 2.3 09/20/2013 1047   MONOABS 0.6 09/20/2013 1047   EOSABS 0.5 09/20/2013 1047   BASOSABS 0.0 09/20/2013 1047    Cardiac Enzymes: Lab Results  Component Value Date   CKTOTAL 86 11/28/2007   CKMB 2.1 11/28/2007   TROPONINI <0.03 07/21/2015    Physical Exam: General: Vital signs reviewed and noted. Well-developed, well-nourished, in no acute distress; alert, appropriate and cooperative throughout examination.  Lungs:  Mild tachypnea, air entry equal bilaterally. Faint scattered wheezes.   Heart: RRR. S1 and S2 normal without gallop, murmur, or rubs.  Abdomen:  BS normoactive. Soft, Nondistended, non-tender.  No masses or organomegaly.  Extremities: No pretibial edema.     Assessment/ Plan:    Patient w/ admission for chest pain, initial troponins x3 negative. EKG w/ no significant ischemic changes on  admission. Stress test performed today showed a large defect of severe severity present in the basal inferolateral, mid inferolateral and apical lateral locations as well as a small defect of mild severity present in the mid anterior location. Evaluated at bedside this evening, appears quite comfortable, 4/10 pain according to the patient. No other symptoms.  -Will give Vicodin q6h prn for pain for now -Repeat EKG -Plan for cath in AM per cardiology notes, NPO at midnight.    Corky Sox, MD  07/21/2015, 9:10 PM

## 2015-07-21 NOTE — Progress Notes (Signed)
Lexiscan Myoview showed   There was no ST segment deviation noted during stress.  Defect 1: There is a large defect of severe severity present in the basal inferolateral, mid inferolateral and apical lateral location.  Defect 2: There is a small defect of mild severity present in the mid anterior location.  Findings consistent with ischemia and prior myocardial infarction.  This is a high risk study.  The left ventricular ejection fraction is moderately decreased (30-44%).  Nuclear stress EF: 32%.  - Case discussed with Dr. Marlou Porch. Will place for cath in AM. Noted patient has tentatively schedule at 1545 EGD tomorrow for Incidental finding on CT showed distal thickening of the esophagus with pathologically enlarged lymph nodes. We have paged on call GI doc and IM regarding our plan.

## 2015-07-21 NOTE — Progress Notes (Signed)
Subjective: Patient denies any chest pain overnight, states he is feeling well and wishes to go home. He denied any difficulty breathing, dyspnea on exertion, or worsening of his baseline orthopnea. He also denied any difficulty swallowing, fevers, chills, or weight loss.   Objective: Vital signs in last 24 hours: Filed Vitals:   07/20/15 1745 07/20/15 1815 07/20/15 2015 07/21/15 0500  BP: 148/82 161/86 108/49 140/82  Pulse:   76 78  Temp:  97.9 F (36.6 C) 97.6 F (36.4 C) 98.2 F (36.8 C)  TempSrc:  Oral Oral Oral  Resp: 22 97 18 18  Height:  6' (1.829 m)    Weight:  113.989 kg (251 lb 4.8 oz)  113.762 kg (250 lb 12.8 oz)  SpO2:   97% 94%   Weight change:   Intake/Output Summary (Last 24 hours) at 07/21/15 0804 Last data filed at 07/21/15 0000  Gross per 24 hour  Intake    422 ml  Output   1675 ml  Net  -1253 ml   BP 178/93 mmHg  Pulse 86  Temp(Src) 98.2 F (36.8 C) (Oral)  Resp 18  Ht 6' (1.829 m)  Wt 113.762 kg (250 lb 12.8 oz)  BMI 34.01 kg/m2  SpO2 94%  General Appearance:    Alert, cooperative, no distress, appears stated age  Head:    Normocephalic, without obvious abnormality, atraumatic  Lungs:     Clear to auscultation bilaterally, respirations unlabored  Chest wall:    No tenderness or deformity  Heart:    Regular rate and rhythm, S1 and S2 normal, no murmur, rub   or gallop  Abdomen:     Soft, non-tender  no masses, no organomegaly  Extremities:   Extremities normal, atraumatic, no cyanosis or edema  Pulses:   2+ and symmetric all extremities  Skin:   Skin color, texture, turgor normal, no rashes or lesions   Lab Results: Results for orders placed or performed during the hospital encounter of 07/20/15 (from the past 24 hour(s))  CBG monitoring, ED     Status: Abnormal   Collection Time: 07/20/15 12:02 PM  Result Value Ref Range   Glucose-Capillary 155 (H) 65 - 99 mg/dL  Hemoglobin A1c     Status: Abnormal   Collection Time: 07/20/15 12:22 PM    Result Value Ref Range   Hgb A1c MFr Bld 6.8 (H) 4.8 - 5.6 %   Mean Plasma Glucose 148 mg/dL  Troponin I     Status: None   Collection Time: 07/20/15 12:22 PM  Result Value Ref Range   Troponin I <0.03 <0.031 ng/mL  Hepatic function panel     Status: Abnormal   Collection Time: 07/20/15 12:22 PM  Result Value Ref Range   Total Protein 7.8 6.5 - 8.1 g/dL   Albumin 3.9 3.5 - 5.0 g/dL   AST 27 15 - 41 U/L   ALT 12 (L) 17 - 63 U/L   Alkaline Phosphatase 127 (H) 38 - 126 U/L   Total Bilirubin 1.1 0.3 - 1.2 mg/dL   Bilirubin, Direct 0.2 0.1 - 0.5 mg/dL   Indirect Bilirubin 0.9 0.3 - 0.9 mg/dL  Glucose, capillary     Status: None   Collection Time: 07/20/15  6:42 PM  Result Value Ref Range   Glucose-Capillary 83 65 - 99 mg/dL  Troponin I     Status: None   Collection Time: 07/20/15  7:26 PM  Result Value Ref Range   Troponin I <0.03 <0.031 ng/mL  Glucose,  capillary     Status: Abnormal   Collection Time: 07/20/15 10:28 PM  Result Value Ref Range   Glucose-Capillary 149 (H) 65 - 99 mg/dL  Troponin I     Status: None   Collection Time: 07/21/15 12:16 AM  Result Value Ref Range   Troponin I <0.03 <0.031 ng/mL  CBC     Status: Abnormal   Collection Time: 07/21/15 12:16 AM  Result Value Ref Range   WBC 9.1 4.0 - 10.5 K/uL   RBC 4.71 4.22 - 5.81 MIL/uL   Hemoglobin 13.7 13.0 - 17.0 g/dL   HCT 40.3 39.0 - 52.0 %   MCV 85.6 78.0 - 100.0 fL   MCH 29.1 26.0 - 34.0 pg   MCHC 34.0 30.0 - 36.0 g/dL   RDW 14.1 11.5 - 15.5 %   Platelets 141 (L) 150 - 400 K/uL   Micro Results: No results found for this or any previous visit (from the past 240 hour(s)). Studies/Results: Dg Chest 2 View  07/20/2015   CLINICAL DATA:  Acute onset of shortness of breath and generalized chest pain. Initial encounter.  EXAM: CHEST  2 VIEW  COMPARISON:  Chest radiograph performed 09/20/2013  FINDINGS: The lungs are well-aerated. Mild vascular congestion is noted, with mildly increased interstitial markings,  raising question for mild interstitial edema. There is no evidence of pleural effusion or pneumothorax.  The heart is mildly enlarged. The patient is status post median sternotomy. Multiple fractured sternal wires are seen. An AICD is noted at the left chest wall with a single lead ending at the right ventricle. No acute osseous abnormalities are identified.  IMPRESSION: Mild vascular congestion and mild cardiomegaly, with slightly increased interstitial markings, raising concern for mild interstitial edema.   Electronically Signed   By: Garald Balding M.D.   On: 07/20/2015 06:17   Ct Angio Chest Aorta W/cm &/or Wo/cm  07/20/2015   CLINICAL DATA:  Diffuse chest pain  EXAM: CT ANGIOGRAPHY CHEST WITH CONTRAST  TECHNIQUE: Multidetector CT imaging of the chest was performed using the standard protocol during bolus administration of intravenous contrast. Multiplanar CT image reconstructions and MIPs were obtained to evaluate the vascular anatomy.  CONTRAST:  17mL OMNIPAQUE IOHEXOL 350 MG/ML SOLN  COMPARISON:  None.  FINDINGS: Mediastinum: The heart size appears moderately enlarged. There is no pericardial effusion identified. Status post median sternotomy and CABG procedure. Nonunion of sternotomy defect noted. There is abnormal wall thickening involving the distal esophagus. There are prominent lymph nodes surrounding the distal esophagus. Index node on the right measures 1.6 cm, image 80/series 401. Just below the diaphragm there is a gastrohepatic ligament node measuring 1.3 cm, image 92 of series 401. The trachea appears patent and is midline.  The main pulmonary artery appears normal. No lobar or segmental pulmonary artery filling defects identified. There is no lobar or segmental pulmonary artery filling defects to suggest a clinically significant acute pulmonary embolus.  Lungs/Pleura: No pleural effusion identified. No airspace consolidation. Calcified granuloma is identified within the right upper lobe, image  32 of series 402. There is no suspicious pulmonary nodule or mass identified.  Upper Abdomen: The visualized portions of the liver are unremarkable. The visualized spleen is normal.  Musculoskeletal: Review of the visualized bony structures is negative for aggressive lytic or sclerotic bone lesion.  Review of the MIP images confirms the above findings.  IMPRESSION: 1. No evidence for an acute pulmonary embolus. 2. Abnormal wall thickening involving the distal esophagus. Additionally, there are several adjacent  pathologically enlarged lymph nodes. This is a worrisome finding in further investigation with upper endoscopy is advise. 3. 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   Electronically Signed   By: Kerby Moors M.D.   On: 07/20/2015 08:49   Medications: I have reviewed the patient's current medications. Scheduled Meds: . allopurinol  300 mg Oral Daily  . aspirin  81 mg Oral Daily  . atorvastatin  40 mg Oral Daily  . enoxaparin (LOVENOX) injection  40 mg Subcutaneous Q24H  . famotidine  20 mg Oral BID  . insulin aspart  0-15 Units Subcutaneous TID WC  . insulin detemir  40 Units Subcutaneous BID  . irbesartan  150 mg Oral Daily  . levothyroxine  75 mcg Oral QAC breakfast  . metoprolol succinate  100 mg Oral Daily  . sodium chloride  3 mL Intravenous Q12H   Continuous Infusions:  PRN Meds:.nitroGLYCERIN Assessment/Plan: Principal Problem:   Chest pain Active Problems:   Diabetes mellitus, type 2   Hyperlipidemia   Essential hypertension   Obstructive sleep apnea   Coronary artery disease-status post CABG   Automatic implantable cardioverter-defibrillator in situ   Systolic and diastolic CHF, chronic   PAD (peripheral artery disease)  Chest pain- Due to the patient's extensive cardiac history, ACS rule out is necessary. The patient denies that this pain feels similar to past cardiac incidents, and the negatively trending troponins indicate this is probably not  an ischemic event. More likely either a result of his CHF or something involving the new findings around his esophagus on CT - Tele for ACS rule out - Cardiology consulted - Lexiscan myoview stress test for perfusion eval  - Troponins negative x3 - EKG showed no changes - Cont meds- Metop, ARBs and Lipitor - Continues ASA 81mg  daily - LFTs unremarkable  Esophogeal thickening: Incidental finding on CT showed distal thickening of the esophagus with pathologically enlarged lymph nodes - Consulted GI - Scheduled for EGD tomorrow  Chronic CHF s/p ICD- Last measured EF 40-45% in 2013. Pt has chronic stable orthopnea and no overt signs of acute fluid overload - Chest xray shows some mild interstitial edema - Cont metop and lorsatan. - Consider echo if CHF exacerbation becomes more likely  Diabetes-  - A1C 6.8% - Hold Glipizide and Liraglutide - SSI-M - Levemir 40u BID  DVT ppx- Lovenox  Dispo: Disposition is deferred at this time, awaiting improvement and evaluation of current medical problems.   This is a Careers information officer Note.  The care of the patient was discussed with Dr. Burgess Estelle and the assessment and plan formulated with their assistance.  Please see their attached note for official documentation of the daily encounter.     Sandria Manly, Med Student 07/21/2015, 8:04 AM

## 2015-07-21 NOTE — Progress Notes (Signed)
Subjective: Patient denies any chest pain overnight, states he is feeling well and wanted to go home No orthopnea or pnd. No f/c/n/v/d/c  Objective: Vital signs in last 24 hours: Filed Vitals:   07/21/15 1309 07/21/15 1311 07/21/15 1313 07/21/15 1314  BP: 171/91 180/97 178/93   Pulse: 99 89 85 86  Temp:      TempSrc:      Resp:      Height:      Weight:      SpO2:       Weight change:   Intake/Output Summary (Last 24 hours) at 07/21/15 1355 Last data filed at 07/21/15 0000  Gross per 24 hour  Intake    422 ml  Output    375 ml  Net     47 ml   GENERAL- Pleasant , alert, co-operative, appears as stated age, not in any distress. HEENT- Atraumatic, normocephalic, PERRL, EOMI, oral mucosa appears moist, neck supple. CARDIAC- RRR, no murmurs, rubs or gallops, no tenderness to palpation, pain not reproducible. RESP- Moving equal volumes of air, and clear to auscultation bilaterally, no wheezes or crackles. ABDOMEN- Soft, nontender, no guarding or rebound, bowel sounds present. EXTREMITIES- pulse 2+, symmetric, minimal pedal edema. SKIN- Warm, dry, No rash or lesion. PSYCH- Normal mood and affect,   Lab Results: @LABTEST2 @ CBC Latest Ref Rng 07/21/2015 07/20/2015 09/20/2013  WBC 4.0 - 10.5 K/uL 9.1 9.1 -  Hemoglobin 13.0 - 17.0 g/dL 13.7 13.3 15.3  Hematocrit 39.0 - 52.0 % 40.3 39.2 45.0  Platelets 150 - 400 K/uL 141(L) 160 -    BMP Latest Ref Rng 07/20/2015 09/20/2013 07/30/2013  Glucose 65 - 99 mg/dL 211(H) 222(H) 138(H)  BUN 6 - 20 mg/dL 11 15 15   Creatinine 0.61 - 1.24 mg/dL 1.08 1.30 1.22  Sodium 135 - 145 mmol/L 136 140 137  Potassium 3.5 - 5.1 mmol/L 4.3 4.3 4.0  Chloride 101 - 111 mmol/L 103 104 105  CO2 22 - 32 mmol/L 25 - 23  Calcium 8.9 - 10.3 mg/dL 8.9 - 9.2     Micro Results: No results found for this or any previous visit (from the past 240 hour(s)). Studies/Results: Dg Chest 2 View  07/20/2015   CLINICAL DATA:  Acute onset of shortness of breath and  generalized chest pain. Initial encounter.  EXAM: CHEST  2 VIEW  COMPARISON:  Chest radiograph performed 09/20/2013  FINDINGS: The lungs are well-aerated. Mild vascular congestion is noted, with mildly increased interstitial markings, raising question for mild interstitial edema. There is no evidence of pleural effusion or pneumothorax.  The heart is mildly enlarged. The patient is status post median sternotomy. Multiple fractured sternal wires are seen. An AICD is noted at the left chest wall with a single lead ending at the right ventricle. No acute osseous abnormalities are identified.  IMPRESSION: Mild vascular congestion and mild cardiomegaly, with slightly increased interstitial markings, raising concern for mild interstitial edema.   Electronically Signed   By: Garald Balding M.D.   On: 07/20/2015 06:17   Ct Angio Chest Aorta W/cm &/or Wo/cm  07/20/2015   CLINICAL DATA:  Diffuse chest pain  EXAM: CT ANGIOGRAPHY CHEST WITH CONTRAST  TECHNIQUE: Multidetector CT imaging of the chest was performed using the standard protocol during bolus administration of intravenous contrast. Multiplanar CT image reconstructions and MIPs were obtained to evaluate the vascular anatomy.  CONTRAST:  124mL OMNIPAQUE IOHEXOL 350 MG/ML SOLN  COMPARISON:  None.  FINDINGS: Mediastinum: The heart size appears moderately  enlarged. There is no pericardial effusion identified. Status post median sternotomy and CABG procedure. Nonunion of sternotomy defect noted. There is abnormal wall thickening involving the distal esophagus. There are prominent lymph nodes surrounding the distal esophagus. Index node on the right measures 1.6 cm, image 80/series 401. Just below the diaphragm there is a gastrohepatic ligament node measuring 1.3 cm, image 92 of series 401. The trachea appears patent and is midline.  The main pulmonary artery appears normal. No lobar or segmental pulmonary artery filling defects identified. There is no lobar or segmental  pulmonary artery filling defects to suggest a clinically significant acute pulmonary embolus.  Lungs/Pleura: No pleural effusion identified. No airspace consolidation. Calcified granuloma is identified within the right upper lobe, image 32 of series 402. There is no suspicious pulmonary nodule or mass identified.  Upper Abdomen: The visualized portions of the liver are unremarkable. The visualized spleen is normal.  Musculoskeletal: Review of the visualized bony structures is negative for aggressive lytic or sclerotic bone lesion.  Review of the MIP images confirms the above findings.  IMPRESSION: 1. No evidence for an acute pulmonary embolus. 2. Abnormal wall thickening involving the distal esophagus. Additionally, there are several adjacent pathologically enlarged lymph nodes. This is a worrisome finding in further investigation with upper endoscopy is advise. 3. 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   Electronically Signed   By: Kerby Moors M.D.   On: 07/20/2015 08:49   Medications: I have reviewed the patient's current medications. Scheduled Meds: . allopurinol  300 mg Oral Daily  . aspirin  81 mg Oral Daily  . atorvastatin  40 mg Oral Daily  . enoxaparin (LOVENOX) injection  40 mg Subcutaneous Q24H  . famotidine  20 mg Oral BID  . insulin aspart  0-15 Units Subcutaneous TID WC  . insulin detemir  40 Units Subcutaneous BID  . irbesartan  150 mg Oral Daily  . levothyroxine  75 mcg Oral QAC breakfast  . metoprolol succinate  100 mg Oral Daily  . regadenoson  0.4 mg Intravenous Once  . regadenoson  0.4 mg Intravenous Once  . sodium chloride  3 mL Intravenous Q12H   Continuous Infusions:  PRN Meds:.nitroGLYCERIN Assessment/Plan: Principal Problem:   Chest pain Active Problems:   Diabetes mellitus, type 2   Hyperlipidemia   Essential hypertension   Obstructive sleep apnea   Coronary artery disease-status post CABG   Automatic implantable  cardioverter-defibrillator in situ   Systolic and diastolic CHF, chronic   PAD (peripheral artery disease) Chest pain- Due to the patient's extensive cardiac history, ACS rule out is necessary. Last CATH- 07/2012 revealed severe three vessel obstructive coronary artery disease, patent grafts, but with moderate to severe stenosis at the anastomotic sites of the LIMA to LAD. Cath was done due to patients increasing dyspnea, and prior Myoview showed apical infarct.  Notes from 07/2012 noted that pt has a left circumflex occlusion, not amenable to revascularization, and corresponds to area of ischemia noted on stress myoview. Medical management was therefore recommended. He has been compliant with his aspirin 325mg  daily, lipitor, metoprolol, and ARBs. The patient denies that this pain feels similar to past cardiac incidents, and the negatively trending troponins indicate this is probably not an ischemic event. More likely either a result of his CHF or something involving the new findings around his esophagus on CT  - Tele for ACS rule out - Cardiology consulted - Lexiscan myoview stress test for perfusion eval- prelim results shows it  is unremarkable  - Troponins negative x3 - EKG showed no changes - Cont meds- Metop, ARBs and Lipitor - Continues ASA 81mg  daily - LFTs unremarkable  Esophogeal thickening: Incidental finding on CT showed distal thickening of the esophagus with pathologically enlarged lymph nodes - Consulted GI - Scheduled for EGD tomorrow  Chronic CHF s/p ICD-  Noted per myoview- Ef- 2013- 32%, ventriculogram- 2013- Ef- 40-45%. Pt has chronic dyspnea on exertion, chronic stable orthopnea. Weight appears stable at 256, no overt signs of fluid overload. No echo on file. Not on diuretics. Chest xray shows some mild interstitial edema. ICD placed 1997.  - Chest xray shows some mild interstitial edema - Cont metop and lorsatan. - Consider echo if CHF exacerbation becomes more  likely -given one dose of IV lasix per cardiology  Diabetes-  - A1C 6.8% - Hold Glipizide and Liraglutide - SSI-M - Levemir 40u BID  HTN- BP stable on home meds- metoprolol, and Valsartan. - Cont for now  DVT ppx- Lovenox  Dispo: Disposition is deferred at this time, awaiting improvement and evaluation of current medical problems.   Dispo: Disposition is deferred at this time, awaiting improvement of current medical problems.  Anticipated discharge in approximately 1-2 day(s).   The patient does have a current PCP Karlene Einstein, MD) and does need an [facility] hospital follow-up appointment after discharge.  The patient does not have transportation limitations that hinder transportation to clinic appointments.  .Services Needed at time of discharge: Y = Yes, Blank = No PT:   OT:   RN:   Equipment:   Other:       Burgess Estelle, MD 07/21/2015, 1:55 PM

## 2015-07-21 NOTE — Progress Notes (Signed)
Nutrition Brief Note  Patient identified on the Malnutrition Screening Tool (MST) Report  Wt Readings from Last 15 Encounters:  07/21/15 250 lb 12.8 oz (113.762 kg)  06/16/15 256 lb (116.121 kg)  04/27/15 254 lb 14.4 oz (115.622 kg)  12/18/14 259 lb (117.482 kg)  12/16/14 259 lb 6.4 oz (117.663 kg)  10/23/14 255 lb 8 oz (115.894 kg)  04/24/14 256 lb 6 oz (116.291 kg)  01/10/14 258 lb (117.028 kg)  12/17/13 261 lb 12.8 oz (118.752 kg)  10/11/13 259 lb (117.482 kg)  07/30/13 252 lb 13.9 oz (114.7 kg)  07/29/13 258 lb (117.028 kg)  06/21/13 257 lb 12.8 oz (116.937 kg)  02/20/13 259 lb 12.8 oz (117.845 kg)  01/30/13 268 lb (121.564 kg)   69 y o m with PMH of 3 vessel CABG- 1997, 2007- redo and then subsequent internal mammary artery anastomosis to LAD - 2008, CHF- with ICD implantation, PAD- s/p stent placement 2014, DM2, HLD, HTN, OSA. Presented with c/o Chest pain that started about 3-4 days ago, was present mildly when he is going to bed, but over the past 2 days, he has woken up suddenly from sleep at about 3.30am, with pain in his upper abdomen, bilaterally, also involving his chest bilaterally, and in the ED he reported pain in his Upper shoulders- left more so than right. He denies associated SOB, nausea, diaphoresis. He cannot tell if the pain is worse with breathing, or with exertion, no aggravating Factors, but wasn pain was relived by fentanyl given in the ED.  Pt down for Lexiscan at time of visit. Currently NPO.   Pt consumed 100% of [employer] dinner meal yesterday evening.   Wt hx reviewed. UBW 255-260#. Wt changes not significant for time frame.  Body mass index is 34.01 kg/(m^2). Patient meets criteria for obesity class I based on current BMI.   Current diet order is NPO (for procedure), patient is consuming approximately 100% of meals at this time. Labs and medications reviewed.   No nutrition interventions warranted at this time. If nutrition issues arise, please  consult RD.   Bettyjean Stefanski A. Jimmye Norman, RD, LDN, CDE Pager: [phone] After hours Pager: [phone]

## 2015-07-22 ENCOUNTER — Encounter (HOSPITAL_COMMUNITY)
Admission: EM | Disposition: A | Discharge status: Home / Self Care | Payer: Medicare Other | Source: Home / Self Care | Attending: Emergency Medicine

## 2015-07-22 ENCOUNTER — Encounter (HOSPITAL_COMMUNITY): Payer: Self-pay | Admitting: Cardiology

## 2015-07-22 ENCOUNTER — Encounter: Payer: Self-pay | Admitting: Cardiology

## 2015-07-22 DIAGNOSIS — Z951 Presence of aortocoronary bypass graft: Secondary | ICD-10-CM | POA: Diagnosis not present

## 2015-07-22 DIAGNOSIS — I2510 Atherosclerotic heart disease of native coronary artery without angina pectoris: Secondary | ICD-10-CM | POA: Diagnosis not present

## 2015-07-22 DIAGNOSIS — I509 Heart failure, unspecified: Secondary | ICD-10-CM | POA: Diagnosis not present

## 2015-07-22 DIAGNOSIS — Z95810 Presence of automatic (implantable) cardiac defibrillator: Secondary | ICD-10-CM

## 2015-07-22 DIAGNOSIS — R079 Chest pain, unspecified: Secondary | ICD-10-CM | POA: Diagnosis present

## 2015-07-22 DIAGNOSIS — R0789 Other chest pain: Secondary | ICD-10-CM | POA: Diagnosis not present

## 2015-07-22 HISTORY — PX: CARDIAC CATHETERIZATION: SHX172

## 2015-07-22 LAB — GLUCOSE, CAPILLARY
Glucose-Capillary: 136 mg/dL — ABNORMAL HIGH (ref 65–99)
Glucose-Capillary: 107 mg/dL — ABNORMAL HIGH (ref 65–99)
Glucose-Capillary: 110 mg/dL — ABNORMAL HIGH (ref 65–99)
Glucose-Capillary: 223 mg/dL — ABNORMAL HIGH (ref 65–99)

## 2015-07-22 SURGERY — LEFT HEART CATH AND CORONARY ANGIOGRAPHY
Anesthesia: LOCAL

## 2015-07-22 MED ORDER — SODIUM CHLORIDE 0.9 % IJ SOLN
3.0000 mL | Freq: Two times a day (BID) | INTRAMUSCULAR | Status: DC
  Administered 2015-07-22 – 2015-07-23 (×2): 3 mL via INTRAVENOUS

## 2015-07-22 MED ORDER — SODIUM CHLORIDE 0.9 % IJ SOLN: 3.0000 mL | INTRAMUSCULAR | Status: DC | PRN

## 2015-07-22 MED ORDER — HEPARIN (PORCINE) IN NACL 2-0.9 UNIT/ML-% IJ SOLN
INTRAMUSCULAR | Status: AC
  Filled 2015-07-22: qty 1500

## 2015-07-22 MED ORDER — VERAPAMIL HCL 2.5 MG/ML IV SOLN
INTRAVENOUS | Status: AC
  Filled 2015-07-22: qty 2

## 2015-07-22 MED ORDER — HEPARIN SODIUM (PORCINE) 1000 UNIT/ML IJ SOLN
INTRAMUSCULAR | Status: AC
  Filled 2015-07-22: qty 1

## 2015-07-22 MED ORDER — SODIUM CHLORIDE 0.9 % IV SOLN: 250.0000 mL | INTRAVENOUS | Status: DC | PRN

## 2015-07-22 MED ORDER — VERAPAMIL HCL 2.5 MG/ML IV SOLN
INTRAVENOUS | Status: DC | PRN
  Administered 2015-07-22: 15:00:00 via INTRA_ARTERIAL

## 2015-07-22 MED ORDER — FENTANYL CITRATE (PF) 100 MCG/2ML IJ SOLN
INTRAMUSCULAR | Status: AC
  Filled 2015-07-22: qty 4

## 2015-07-22 MED ORDER — LIDOCAINE HCL (PF) 1 % IJ SOLN
INTRAMUSCULAR | Status: AC
  Filled 2015-07-22: qty 30

## 2015-07-22 MED ORDER — IOHEXOL 350 MG/ML SOLN
INTRAVENOUS | Status: DC | PRN
  Administered 2015-07-22: 100 mL via INTRAVENOUS

## 2015-07-22 MED ORDER — SODIUM CHLORIDE 0.9 % IJ SOLN: 3.0000 mL | Freq: Two times a day (BID) | INTRAMUSCULAR | Status: DC

## 2015-07-22 MED ORDER — SODIUM CHLORIDE 0.9 % WEIGHT BASED INFUSION: 1.0000 mL/kg/h | INTRAVENOUS | Status: DC

## 2015-07-22 MED ORDER — MIDAZOLAM HCL 2 MG/2ML IJ SOLN
INTRAMUSCULAR | Status: DC | PRN
  Administered 2015-07-22: 1 mg via INTRAVENOUS

## 2015-07-22 MED ORDER — SODIUM CHLORIDE 0.9 % IJ SOLN
3.0000 mL | Freq: Two times a day (BID) | INTRAMUSCULAR | Status: DC
  Administered 2015-07-22: 3 mL via INTRAVENOUS

## 2015-07-22 MED ORDER — MIDAZOLAM HCL 2 MG/2ML IJ SOLN
INTRAMUSCULAR | Status: AC
  Filled 2015-07-22: qty 4

## 2015-07-22 MED ORDER — FENTANYL CITRATE (PF) 100 MCG/2ML IJ SOLN
INTRAMUSCULAR | Status: DC | PRN
  Administered 2015-07-22: 25 ug via INTRAVENOUS

## 2015-07-22 MED ORDER — ASPIRIN 81 MG PO CHEW: 81.0000 mg | CHEWABLE_TABLET | ORAL | Status: DC

## 2015-07-22 MED ORDER — NITROGLYCERIN 1 MG/10 ML FOR IR/CATH LAB
INTRA_ARTERIAL | Status: AC
  Filled 2015-07-22: qty 10

## 2015-07-22 MED ORDER — ASPIRIN 81 MG PO CHEW
81.0000 mg | CHEWABLE_TABLET | Freq: Once | ORAL | Status: AC
  Administered 2015-07-22: 81 mg via ORAL
  Filled 2015-07-22: qty 1

## 2015-07-22 MED ORDER — SODIUM CHLORIDE 0.9 % WEIGHT BASED INFUSION
3.0000 mL/kg/h | INTRAVENOUS | Status: DC
  Administered 2015-07-22: 3 mL/kg/h via INTRAVENOUS

## 2015-07-22 MED ORDER — HEPARIN SODIUM (PORCINE) 1000 UNIT/ML IJ SOLN
INTRAMUSCULAR | Status: DC | PRN
  Administered 2015-07-22: 5000 [IU] via INTRAVENOUS

## 2015-07-22 SURGICAL SUPPLY — 12 items
CATH INFINITI 5 FR IM (CATHETERS) ×1 IMPLANT
CATH INFINITI 5 FR LCB (CATHETERS) ×1 IMPLANT
CATH INFINITI 5FR AL1 (CATHETERS) ×1 IMPLANT
CATH INFINITI 5FR MULTPACK ANG (CATHETERS) ×1 IMPLANT
DEVICE RAD COMP TR BAND LRG (VASCULAR PRODUCTS) ×2 IMPLANT
GLIDESHEATH SLEND SS 6F .021 (SHEATH) ×2 IMPLANT
KIT HEART LEFT (KITS) ×2 IMPLANT
PACK CARDIAC CATHETERIZATION (CUSTOM PROCEDURE TRAY) ×2 IMPLANT
SYR MEDRAD MARK V 150ML (SYRINGE) ×2 IMPLANT
TRANSDUCER W/STOPCOCK (MISCELLANEOUS) ×2 IMPLANT
TUBING CIL FLEX 10 FLL-RA (TUBING) ×2 IMPLANT
WIRE SAFE-T 1.5MM-J .035X260CM (WIRE) ×2 IMPLANT

## 2015-07-22 NOTE — Progress Notes (Signed)
Patient ID: SABRE LEONETTI, male   DOB: Feb 19, 1946, 69 y.o.   MRN: [id number] Medicine attending: I examined this patient this morning together with resident physician Dr. Burgess Estelle and third-year medical student Mr. Sandria Manly. With respect to his atypical chest pain but known, significant, coronary artery disease, Myoview nuclear medicine stress test done yesterday was read as a high risk study. A large defect in blood flow noted in the basal inferolateral, mid inferolateral, and apical lateral locations with a smaller defect in the mid anterior location. Cardiology plans to proceed with cardiac cath this morning. With respect to his abnormal CT scans showing distal esophageal thickening and abnormal paraesophageal adenopathy, GI was consulted and plan is to proceed with upper endoscopy.  Addendum: Cardiac cath showed severe 3 vessel obstructive coronary artery disease but all of his previous bypass grafts were patent. Cardiologist recommends continued medical therapy and stable to proceed with GI evaluation.

## 2015-07-22 NOTE — Progress Notes (Signed)
Subjective: Pt had an episode of chest pain which resolved on its own. He rated as a 4/10 in severity and He localized it to the lower chest/upper abdomen bilaterally. Other Than that he denies any other complaints over night. Dr. Wonda Horner went and evaluated him. No dizziness, lightheadedness, worsening SOB, or palpitations.  He did not have any further pain this morning, ekg no changes.   He mentioned he had talked to his cardiologist Dr. Martinique last night after his stress test and understood the need for a cardiac catheterization today.  His stress test revealed EF int he 30%s and it indicated a need for cath.  Objective: Vital signs in last 24 hours: Filed Vitals:   07/21/15 1314 07/21/15 1553 07/21/15 2037 07/22/15 0500  BP:  155/76 148/76 142/71  Pulse: 86 86 62 75  Temp:  98 F (36.7 C) 98.4 F (36.9 C) 98.4 F (36.9 C)  TempSrc:  Oral Oral Oral  Resp:   18 18  Height:      Weight:    246 lb 3.2 oz (111.676 kg)  SpO2:  95% 94% 98%   Weight change: -5 lb 1.6 oz (-2.313 kg)  Intake/Output Summary (Last 24 hours) at 07/22/15 1310 Last data filed at 07/22/15 6295  Gross per 24 hour  Intake    200 ml  Output   1025 ml  Net   -825 ml   GENERAL- Pleasant , alert, co-operative, appears as stated age, not in any distress. HEENT- Atraumatic, normocephalic, PERRL, EOMI, oral mucosa appears moist, neck supple. CARDIAC- RRR, no murmurs, rubs or gallops, no tenderness to palpation, pain not reproducible. RESP- Moving equal volumes of air, and clear to auscultation bilaterally, no wheezes or crackles. ABDOMEN- Soft, nontender, no guarding or rebound, bowel sounds present. EXTREMITIES- pulse 2+, symmetric, minimal pedal edema. SKIN- Warm, dry, No rash or lesion. PSYCH- Normal mood and affect,   Lab Results: @LABTEST2 @ Troponin (Point of Care Test)  Recent Labs  07/20/15 0536  TROPIPOC 0.01     Micro Results: No results found for this or any previous visit  (from the past 240 hour(s)). Studies/Results: Nm Myocar Multi W/spect W/wall Motion / Ef  07/21/2015    There was no ST segment deviation noted during stress.  Defect 1: There is a large defect of severe severity present in the  basal inferolateral, mid inferolateral and apical lateral location.  Defect 2: There is a small defect of mild severity present in the mid  anterior location.  Findings consistent with ischemia and prior myocardial infarction.  This is a high risk study.  The left ventricular ejection fraction is moderately decreased (30-44%).  Nuclear stress EF: 32%.    Medications: I have reviewed the patient's current medications. Scheduled Meds: . allopurinol  300 mg Oral Daily  . aspirin  81 mg Oral Daily  . atorvastatin  40 mg Oral Daily  . [START ON 07/23/2015] enoxaparin (LOVENOX) injection  40 mg Subcutaneous Q24H  . famotidine  20 mg Oral BID  . insulin aspart  0-15 Units Subcutaneous TID WC  . insulin detemir  40 Units Subcutaneous BID  . irbesartan  150 mg Oral Daily  . levothyroxine  75 mcg Oral QAC breakfast  . metoprolol succinate  100 mg Oral Daily  . regadenoson  0.4 mg Intravenous Once  . regadenoson  0.4 mg Intravenous Once  . sodium chloride  3 mL Intravenous Q12H  . sodium chloride  3 mL Intravenous Q12H   Continuous Infusions: .  sodium chloride 1 mL/kg/hr (07/22/15 0730)   PRN Meds:.sodium chloride, HYDROcodone-acetaminophen, nitroGLYCERIN, sodium chloride Assessment/Plan: Principal Problem:   Chest pain Active Problems:   Diabetes mellitus, type 2   Hyperlipidemia   Essential hypertension   Obstructive sleep apnea   Coronary artery disease-status post CABG   Automatic implantable cardioverter-defibrillator in situ   Systolic and diastolic CHF, chronic   PAD (peripheral artery disease) Chest pain- Due to the patient's extensive cardiac history, ACS rule out is necessary. Last CATH- 07/2012 revealed severe three vessel obstructive coronary artery  disease, patent grafts, but with moderate to severe stenosis at the anastomotic sites of the LIMA to LAD. Cath was done due to patients increasing dyspnea, and prior Myoview showed apical infarct.  Notes from 07/2012 noted that pt has a left circumflex occlusion, not amenable to revascularization, and corresponds to area of ischemia noted on stress myoview. Medical management was therefore recommended. He has been compliant with his aspirin 325mg  daily, lipitor, metoprolol, and ARBs. The patient denies that this pain feels similar to past cardiac incidents, and the negatively trending troponins indicate this is probably not an ischemic event. More likely either a result of his CHF or something involving the new findings around his esophagus on CT  - Tele for ACS rule out - Cardiology consulted - Lexiscan myoview stress test showed severe defect in inferolateral portions andmild defect in mid anterior location- Troponins negative x3 -cath today - EKG showed no changes - Cont meds- Metop, ARBs and Lipitor - Continues ASA 81mg  daily - LFTs unremarkable  Esophogeal thickening: Incidental finding on CT showed distal thickening of the esophagus with pathologically enlarged lymph nodes - Consulted GI - Scheduled for EGD today/tomorrow  Chronic CHF s/p ICD- Noted per myoview- Ef- 2013- 32%, ventriculogram- 2013- Ef- 40-45%. Pt has chronic dyspnea on exertion, chronic stable orthopnea. Weight appears stable at 256, no overt signs of fluid overload. No echo on file. Not on diuretics. Chest xray shows some mild interstitial edema. ICD placed 1997.  Chest xray shows some mild interstitial edema - Cont metop and lorsatan. - Consider echo if CHF exacerbation becomes more likely  Diabetes-  - A1C 6.8% - Hold Glipizide and Liraglutide - SSI-M - Levemir 40u BID  HTN- BP stable on home meds- metoprolol, and Valsartan. - Cont for now  DVT ppx- Lovenox   Dispo: Disposition is deferred at this time,  awaiting improvement of current medical problems.  Anticipated discharge in approximately 1-3 day(s).   The patient does have a current PCP Karlene Einstein, MD) and does need an [facility] hospital follow-up appointment after discharge.  The patient does not have transportation limitations that hinder transportation to clinic appointments.  .Services Needed at time of discharge: Y = Yes, Blank = No PT:   OT:   RN:   Equipment:   Other:       Burgess Estelle, MD 07/22/2015, 1:10 PM

## 2015-07-22 NOTE — Progress Notes (Signed)
Patient Name: ZACKRY DEINES Date of Encounter: 07/22/2015  Principal Problem:   Chest pain Active Problems:   Diabetes mellitus, type 2   Hyperlipidemia   Essential hypertension   Obstructive sleep apnea   Coronary artery disease-status post CABG   Automatic implantable cardioverter-defibrillator in situ   Systolic and diastolic CHF, chronic   PAD (peripheral artery disease)   Primary Cardiologist: Dr Martinique  Patient Profile: 69 yo male w/ hx CABG, patent grafts 2013, med rx for CFX occlusion, abnl MV 2nd CFX, HTN, HL, VT w/ ICD, gout, DM, OSA, obesity and chronic SOB. Admitted 08/08 w/ chest pain, ?GI vs cardiac origin.  SUBJECTIVE: Not currently with pain, the pain is in the Xyphoid area and goes through to his back.  OBJECTIVE Filed Vitals:   07/21/15 1314 07/21/15 1553 07/21/15 2037 07/22/15 0500  BP:  155/76 148/76 142/71  Pulse: 86 86 62 75  Temp:  98 F (36.7 C) 98.4 F (36.9 C) 98.4 F (36.9 C)  TempSrc:  Oral Oral Oral  Resp:   18 18  Height:      Weight:    246 lb 3.2 oz (111.676 kg)  SpO2:  95% 94% 98%    Intake/Output Summary (Last 24 hours) at 07/22/15 1048 Last data filed at 07/22/15 0622  Gross per 24 hour  Intake    200 ml  Output   1025 ml  Net   -825 ml   Filed Weights   07/20/15 1815 07/21/15 0500 07/22/15 0500  Weight: 251 lb 4.8 oz (113.989 kg) 250 lb 12.8 oz (113.762 kg) 246 lb 3.2 oz (111.676 kg)    PHYSICAL EXAM General: Well developed, well nourished, male in no acute distress. Head: Normocephalic, atraumatic.  Neck: Supple without bruits, JVD not elevated. Lungs:  Resp regular and unlabored, few rales, exp wheeze Heart: RRR, S1, S2, no S3, S4, or murmur; no rub. Abdomen: Soft, non-tender, non-distended, BS + x 4.  Extremities: No clubbing, cyanosis, edema.  Neuro: Alert and oriented X 3. Moves all extremities spontaneously. Psych: Normal affect.  LABS: CBC: Recent Labs  07/20/15 0512 07/21/15 0016  WBC 9.1 9.1  HGB  13.3 13.7  HCT 39.2 40.3  MCV 84.5 85.6  PLT 160 141*   INR: Recent Labs  07/20/15 0512  INR 8.76   Basic Metabolic Panel: Recent Labs  07/20/15 0512  NA 136  K 4.3  CL 103  CO2 25  GLUCOSE 211*  BUN 11  CREATININE 1.08  CALCIUM 8.9   Liver Function Tests: Recent Labs  07/20/15 1222  AST 27  ALT 12*  ALKPHOS 127*  BILITOT 1.1  PROT 7.8  ALBUMIN 3.9   Cardiac Enzymes: Recent Labs  07/20/15 1222 07/20/15 1926 07/21/15 0016  TROPONINI <0.03 <0.03 <0.03    Recent Labs  07/20/15 0536  TROPIPOC 0.01   BNP:  B NATRIURETIC PEPTIDE  Date/Time Value Ref Range Status  07/20/2015 05:12 AM 217.8* 0.0 - 100.0 pg/mL Final   Hemoglobin A1C: Recent Labs  07/20/15 1222  HGBA1C 6.8*   TELE:   SR    Radiology/Studies: Nm Myocar Multi W/spect W/wall Motion / Ef 07/21/2015    There was no ST segment deviation noted during stress.  Defect 1: There is a large defect of severe severity present in the  basal inferolateral, mid inferolateral and apical lateral location.  Defect 2: There is a small defect of mild severity present in the mid  anterior location.  Findings consistent  with ischemia and prior myocardial infarction.  This is a high risk study.  The left ventricular ejection fraction is moderately decreased (30-44%).  Nuclear stress EF: 32%.      Current Medications:  . allopurinol  300 mg Oral Daily  . aspirin  81 mg Oral Daily  . atorvastatin  40 mg Oral Daily  . [START ON 07/23/2015] enoxaparin (LOVENOX) injection  40 mg Subcutaneous Q24H  . famotidine  20 mg Oral BID  . insulin aspart  0-15 Units Subcutaneous TID WC  . insulin detemir  40 Units Subcutaneous BID  . irbesartan  150 mg Oral Daily  . levothyroxine  75 mcg Oral QAC breakfast  . metoprolol succinate  100 mg Oral Daily  . regadenoson  0.4 mg Intravenous Once  . regadenoson  0.4 mg Intravenous Once  . sodium chloride  3 mL Intravenous Q12H  . sodium chloride  3 mL Intravenous Q12H   .  sodium chloride 1 mL/kg/hr (07/22/15 0730)    ASSESSMENT AND PLAN: Principal Problem:   Chest pain - hx CABG and had abnl MV this admit, so cath indicated - however, Dr Martinique is concerned about GI origin and EGD planned today also - per Dr Martinique, he will do diagnostic study only so EGD can be performed today - spoke with Dr Penelope Coop to let him know OK with cards to do EGD today - pt aware of plan and in agreement as this may get him out of hospital sooner  Otherwise, per IM. Consider rx for wheezing. His BB is home dose, so do not think that is responsible Active Problems:   Diabetes mellitus, type 2   Hyperlipidemia   Essential hypertension   Obstructive sleep apnea   Coronary artery disease-status post CABG   Automatic implantable cardioverter-defibrillator in situ   Systolic and diastolic CHF, chronic   PAD (peripheral artery disease)   Signed, Rosaria Ferries , PA-C 10:48 AM 07/22/2015  Agree with note by Rosaria Ferries PA-C   Pt with atypical CP/epigastric pain. Known CAD. Enz neg. Myoview high risk with lateral scar and moderate superimposed isch (similar to prior MVs), known tot LCX. Also has chext CT which showed distal esoph thickening with nodes (pt says he has been having difficulty swallowing). Plan cath this AM and EGD later this PM.  Lorretta Harp, M.D., [place], [facility], FAHA, [place] [address] [place], [place]  [address]  [phone] 07/22/2015 11:14 AM

## 2015-07-22 NOTE — H&P (View-Only) (Signed)
Patient Name: Walter French Date of Encounter: 07/22/2015  Principal Problem:   Chest pain Active Problems:   Diabetes mellitus, type 2   Hyperlipidemia   Essential hypertension   Obstructive sleep apnea   Coronary artery disease-status post CABG   Automatic implantable cardioverter-defibrillator in situ   Systolic and diastolic CHF, chronic   PAD (peripheral artery disease)   Primary Cardiologist: Dr Martinique  Patient Profile: 69 yo male w/ hx CABG, patent grafts 2013, med rx for CFX occlusion, abnl MV 2nd CFX, HTN, HL, VT w/ ICD, gout, DM, OSA, obesity and chronic SOB. Admitted 08/08 w/ chest pain, ?GI vs cardiac origin.  SUBJECTIVE: Not currently with pain, the pain is in the Xyphoid area and goes through to his back.  OBJECTIVE Filed Vitals:   07/21/15 1314 07/21/15 1553 07/21/15 2037 07/22/15 0500  BP:  155/76 148/76 142/71  Pulse: 86 86 62 75  Temp:  98 F (36.7 C) 98.4 F (36.9 C) 98.4 F (36.9 C)  TempSrc:  Oral Oral Oral  Resp:   18 18  Height:      Weight:    246 lb 3.2 oz (111.676 kg)  SpO2:  95% 94% 98%    Intake/Output Summary (Last 24 hours) at 07/22/15 1048 Last data filed at 07/22/15 0622  Gross per 24 hour  Intake    200 ml  Output   1025 ml  Net   -825 ml   Filed Weights   07/20/15 1815 07/21/15 0500 07/22/15 0500  Weight: 251 lb 4.8 oz (113.989 kg) 250 lb 12.8 oz (113.762 kg) 246 lb 3.2 oz (111.676 kg)    PHYSICAL EXAM General: Well developed, well nourished, male in no acute distress. Head: Normocephalic, atraumatic.  Neck: Supple without bruits, JVD not elevated. Lungs:  Resp regular and unlabored, few rales, exp wheeze Heart: RRR, S1, S2, no S3, S4, or murmur; no rub. Abdomen: Soft, non-tender, non-distended, BS + x 4.  Extremities: No clubbing, cyanosis, edema.  Neuro: Alert and oriented X 3. Moves all extremities spontaneously. Psych: Normal affect.  LABS: CBC: Recent Labs  07/20/15 0512 07/21/15 0016  WBC 9.1 9.1  HGB  13.3 13.7  HCT 39.2 40.3  MCV 84.5 85.6  PLT 160 141*   INR: Recent Labs  07/20/15 0512  INR 7.51   Basic Metabolic Panel: Recent Labs  07/20/15 0512  NA 136  K 4.3  CL 103  CO2 25  GLUCOSE 211*  BUN 11  CREATININE 1.08  CALCIUM 8.9   Liver Function Tests: Recent Labs  07/20/15 1222  AST 27  ALT 12*  ALKPHOS 127*  BILITOT 1.1  PROT 7.8  ALBUMIN 3.9   Cardiac Enzymes: Recent Labs  07/20/15 1222 07/20/15 1926 07/21/15 0016  TROPONINI <0.03 <0.03 <0.03    Recent Labs  07/20/15 0536  TROPIPOC 0.01   BNP:  B NATRIURETIC PEPTIDE  Date/Time Value Ref Range Status  07/20/2015 05:12 AM 217.8* 0.0 - 100.0 pg/mL Final   Hemoglobin A1C: Recent Labs  07/20/15 1222  HGBA1C 6.8*   TELE:   SR    Radiology/Studies: Nm Myocar Multi W/spect W/wall Motion / Ef 07/21/2015    There was no ST segment deviation noted during stress.  Defect 1: There is a large defect of severe severity present in the  basal inferolateral, mid inferolateral and apical lateral location.  Defect 2: There is a small defect of mild severity present in the mid  anterior location.  Findings consistent  with ischemia and prior myocardial infarction.  This is a high risk study.  The left ventricular ejection fraction is moderately decreased (30-44%).  Nuclear stress EF: 32%.      Current Medications:  . allopurinol  300 mg Oral Daily  . aspirin  81 mg Oral Daily  . atorvastatin  40 mg Oral Daily  . [START ON 07/23/2015] enoxaparin (LOVENOX) injection  40 mg Subcutaneous Q24H  . famotidine  20 mg Oral BID  . insulin aspart  0-15 Units Subcutaneous TID WC  . insulin detemir  40 Units Subcutaneous BID  . irbesartan  150 mg Oral Daily  . levothyroxine  75 mcg Oral QAC breakfast  . metoprolol succinate  100 mg Oral Daily  . regadenoson  0.4 mg Intravenous Once  . regadenoson  0.4 mg Intravenous Once  . sodium chloride  3 mL Intravenous Q12H  . sodium chloride  3 mL Intravenous Q12H   .  sodium chloride 1 mL/kg/hr (07/22/15 0730)    ASSESSMENT AND PLAN: Principal Problem:   Chest pain - hx CABG and had abnl MV this admit, so cath indicated - however, Dr Martinique is concerned about GI origin and EGD planned today also - per Dr Martinique, he will do diagnostic study only so EGD can be performed today - spoke with Dr Penelope Coop to let him know OK with cards to do EGD today - pt aware of plan and in agreement as this may get him out of hospital sooner  Otherwise, per IM. Consider rx for wheezing. His BB is home dose, so do not think that is responsible Active Problems:   Diabetes mellitus, type 2   Hyperlipidemia   Essential hypertension   Obstructive sleep apnea   Coronary artery disease-status post CABG   Automatic implantable cardioverter-defibrillator in situ   Systolic and diastolic CHF, chronic   PAD (peripheral artery disease)   Signed, Rosaria Ferries , PA-C 10:48 AM 07/22/2015  Agree with note by Rosaria Ferries PA-C   Pt with atypical CP/epigastric pain. Known CAD. Enz neg. Myoview high risk with lateral scar and moderate superimposed isch (similar to prior MVs), known tot LCX. Also has chext CT which showed distal esoph thickening with nodes (pt says he has been having difficulty swallowing). Plan cath this AM and EGD later this PM.  Lorretta Harp, M.D., [place], [facility], FAHA, [place] [address] [place], [place]  [address]  [phone] 07/22/2015 11:14 AM

## 2015-07-22 NOTE — Progress Notes (Signed)
TR band removed at 18:05. No bleeding or hematoma. VSS. Will continue to monitor.   Ruben Reason, RN

## 2015-07-22 NOTE — Interval H&P Note (Signed)
History and Physical Interval Note:  07/22/2015 2:11 PM  Walter French  has presented today for surgery, with the diagnosis of cp  The various methods of treatment have been discussed with the patient and family. After consideration of risks, benefits and other options for treatment, the patient has consented to  Procedure(s): Left Heart Cath and Coronary Angiography (N/A) as a surgical intervention .  [name] history has been reviewed, patient examined, no change in status, stable for surgery.  I have reviewed the patient's chart and labs.  Questions were answered to the patient's satisfaction.     Collier Salina [facility] 07/22/2015 2:12 PM Cath Lab Visit (complete for each Cath Lab visit)  Clinical Evaluation Leading to the Procedure:   ACS: No.  Non-ACS:    Anginal Classification: CCS III  Anti-ischemic medical therapy: Maximal Therapy (2 or more classes of medications)  Non-Invasive Test Results: High-risk stress test findings: cardiac mortality >3%/year  Prior CABG: Previous CABG

## 2015-07-22 NOTE — Progress Notes (Signed)
Subjective: The patient had an episode of chest pain last night, which he rated as a 4/10 in severity and more of an annoyance than anything else. He localized it to the lower chest/upper abdomen bilaterally. Other  Than that he denies any other complaints over night. No difficulty breathing or orthopnea more than usual. No palpitations. He was not currently in chest pain this morning. He mentioned he had talked to his cardiologist Dr. Martinique last night after his stress test and understood the need for a cardiac catheterization today.  Objective: Vital signs in last 24 hours: Filed Vitals:   07/21/15 1314 07/21/15 1553 07/21/15 2037 07/22/15 0500  BP:  155/76 148/76 142/71  Pulse: 86 86 62 75  Temp:  98 F (36.7 C) 98.4 F (36.9 C) 98.4 F (36.9 C)  TempSrc:  Oral Oral Oral  Resp:   18 18  Height:      Weight:    111.676 kg (246 lb 3.2 oz)  SpO2:  95% 94% 98%   Weight change: -2.313 kg (-5 lb 1.6 oz)  Intake/Output Summary (Last 24 hours) at 07/22/15 0835 Last data filed at 07/22/15 8546  Gross per 24 hour  Intake    200 ml  Output   1025 ml  Net   -825 ml   BP 142/71 mmHg  Pulse 75  Temp(Src) 98.4 F (36.9 C) (Oral)  Resp 18  Ht 6' (1.829 m)  Wt 111.676 kg (246 lb 3.2 oz)  BMI 33.38 kg/m2  SpO2 98%  General Appearance:    Alert, cooperative, no distress, appears stated age  Head:    Normocephalic, without obvious abnormality, atraumatic  Neck:   Supple, symmetrical, trachea midline, no adenopathy; no carotid bruit or JVD  Back:     Symmetric, no curvature, ROM normal, no CVA tenderness  Lungs:     Crackles present bilaterally, increased work of breathing  Chest wall:    No tenderness or deformity  Heart:    Regular rate and rhythm, S1 and S2 normal, no murmur, rub   or gallop  Abdomen:     Soft, non-tender, bowel sounds active all four quadrants,    no masses, no organomegaly  Extremities:   Extremities normal, atraumatic, no cyanosis or edema  Pulses:   2+ and  symmetric all extremities  Skin:   Skin color, texture, turgor normal, no rashes or lesions   Lab Results: Results for orders placed or performed during the hospital encounter of 07/20/15 (from the past 24 hour(s))  Glucose, capillary     Status: Abnormal   Collection Time: 07/21/15 11:39 AM  Result Value Ref Range   Glucose-Capillary 103 (H) 65 - 99 mg/dL  Glucose, capillary     Status: Abnormal   Collection Time: 07/21/15  4:12 PM  Result Value Ref Range   Glucose-Capillary 172 (H) 65 - 99 mg/dL  Glucose, capillary     Status: Abnormal   Collection Time: 07/21/15 10:25 PM  Result Value Ref Range   Glucose-Capillary 182 (H) 65 - 99 mg/dL  Glucose, capillary     Status: Abnormal   Collection Time: 07/22/15  7:38 AM  Result Value Ref Range   Glucose-Capillary 136 (H) 65 - 99 mg/dL   Micro Results: No results found for this or any previous visit (from the past 240 hour(s)). Studies/Results: Nm Myocar Multi W/spect W/wall Motion / Ef  07/21/2015    There was no ST segment deviation noted during stress.  Defect 1: There is a  large defect of severe severity present in the  basal inferolateral, mid inferolateral and apical lateral location.  Defect 2: There is a small defect of mild severity present in the mid  anterior location.  Findings consistent with ischemia and prior myocardial infarction.  This is a high risk study.  The left ventricular ejection fraction is moderately decreased (30-44%).  Nuclear stress EF: 32%.    Ct Angio Chest Aorta W/cm &/or Wo/cm  07/20/2015   CLINICAL DATA:  Diffuse chest pain  EXAM: CT ANGIOGRAPHY CHEST WITH CONTRAST  TECHNIQUE: Multidetector CT imaging of the chest was performed using the standard protocol during bolus administration of intravenous contrast. Multiplanar CT image reconstructions and MIPs were obtained to evaluate the vascular anatomy.  CONTRAST:  145mL OMNIPAQUE IOHEXOL 350 MG/ML SOLN  COMPARISON:  None.  FINDINGS: Mediastinum: The heart  size appears moderately enlarged. There is no pericardial effusion identified. Status post median sternotomy and CABG procedure. Nonunion of sternotomy defect noted. There is abnormal wall thickening involving the distal esophagus. There are prominent lymph nodes surrounding the distal esophagus. Index node on the right measures 1.6 cm, image 80/series 401. Just below the diaphragm there is a gastrohepatic ligament node measuring 1.3 cm, image 92 of series 401. The trachea appears patent and is midline.  The main pulmonary artery appears normal. No lobar or segmental pulmonary artery filling defects identified. There is no lobar or segmental pulmonary artery filling defects to suggest a clinically significant acute pulmonary embolus.  Lungs/Pleura: No pleural effusion identified. No airspace consolidation. Calcified granuloma is identified within the right upper lobe, image 32 of series 402. There is no suspicious pulmonary nodule or mass identified.  Upper Abdomen: The visualized portions of the liver are unremarkable. The visualized spleen is normal.  Musculoskeletal: Review of the visualized bony structures is negative for aggressive lytic or sclerotic bone lesion.  Review of the MIP images confirms the above findings.  IMPRESSION: 1. No evidence for an acute pulmonary embolus. 2. Abnormal wall thickening involving the distal esophagus. Additionally, there are several adjacent pathologically enlarged lymph nodes. This is a worrisome finding in further investigation with upper endoscopy is advise. 3. 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   Electronically Signed   By: Kerby Moors M.D.   On: 07/20/2015 08:49   Medications: I have reviewed the patient's current medications. Scheduled Meds: . allopurinol  300 mg Oral Daily  . aspirin  81 mg Oral Daily  . atorvastatin  40 mg Oral Daily  . [START ON 07/23/2015] enoxaparin (LOVENOX) injection  40 mg Subcutaneous Q24H  . famotidine  20  mg Oral BID  . insulin aspart  0-15 Units Subcutaneous TID WC  . insulin detemir  40 Units Subcutaneous BID  . irbesartan  150 mg Oral Daily  . levothyroxine  75 mcg Oral QAC breakfast  . metoprolol succinate  100 mg Oral Daily  . regadenoson  0.4 mg Intravenous Once  . regadenoson  0.4 mg Intravenous Once  . sodium chloride  3 mL Intravenous Q12H  . sodium chloride  3 mL Intravenous Q12H   Continuous Infusions: . sodium chloride 1 mL/kg/hr (07/22/15 0730)   PRN Meds:.sodium chloride, HYDROcodone-acetaminophen, nitroGLYCERIN, sodium chloride Assessment/Plan: Principal Problem:   Chest pain Active Problems:   Diabetes mellitus, type 2   Hyperlipidemia   Essential hypertension   Obstructive sleep apnea   Coronary artery disease-status post CABG   Automatic implantable cardioverter-defibrillator in situ   Systolic and diastolic CHF, chronic  PAD (peripheral artery disease)  Chest pain- Due to the patient's extensive cardiac history, ACS rule out is necessary. The patient denies that this pain feels similar to past cardiac incidents, and the negatively trending troponins indicate this is probably not an ischemic event. More likely either a result of his CHF or something involving the new findings around his esophagus on CT - Tele for ACS rule out - Cardiology consulted, scheduled for catheterization today - Lexiscan myoview stress test showed severe defect in inferolateral portions andmild defect in mid anterior location - EF 32% by nuclear stress test - Troponins negative x3 - EKG continues to show no changes - Cont meds- Metop, ARBs and Lipitor - Continues ASA 81mg  daily - LFTs unremarkable  Esophogeal thickening: Incidental finding on CT showed distal thickening of the esophagus with pathologically enlarged lymph nodes - Consulted GI - Scheduled for EGD today  Chronic CHF s/p ICD- Last measured EF 40-45% in 2013. Pt has chronic stable orthopnea and no overt signs of acute  fluid overload - Chest xray shows some mild interstitial edema - Cont metop and lorsatan. - Consider echo if CHF exacerbation becomes more likely  Diabetes-  - A1C 6.8% - Hold Glipizide and Liraglutide - SSI-M - Levemir 40u BID  DVT ppx- Lovenox  Dispo: Disposition is deferred at this time, awaiting improvement and evaluation of current medical problems.   This is a Careers information officer Note.  The care of the patient was discussed with Dr. Burgess Estelle and the assessment and plan formulated with their assistance.  Please see their attached note for official documentation of the daily encounter.     Sandria Manly, Med Student 07/22/2015, 8:35 AM

## 2015-07-23 ENCOUNTER — Observation Stay (HOSPITAL_COMMUNITY): Payer: Medicare Other

## 2015-07-23 ENCOUNTER — Observation Stay (HOSPITAL_COMMUNITY): Payer: Medicare Other | Admitting: Anesthesiology

## 2015-07-23 ENCOUNTER — Encounter (HOSPITAL_COMMUNITY)
Admission: EM | Disposition: A | Discharge status: Home / Self Care | Payer: Self-pay | Source: Home / Self Care | Attending: Emergency Medicine

## 2015-07-23 ENCOUNTER — Encounter (HOSPITAL_COMMUNITY): Payer: Self-pay | Admitting: Anesthesiology

## 2015-07-23 DIAGNOSIS — K259 Gastric ulcer, unspecified as acute or chronic, without hemorrhage or perforation: Secondary | ICD-10-CM | POA: Diagnosis present

## 2015-07-23 DIAGNOSIS — I739 Peripheral vascular disease, unspecified: Secondary | ICD-10-CM | POA: Diagnosis not present

## 2015-07-23 DIAGNOSIS — K2289 Other specified disease of esophagus: Secondary | ICD-10-CM

## 2015-07-23 DIAGNOSIS — I2510 Atherosclerotic heart disease of native coronary artery without angina pectoris: Secondary | ICD-10-CM

## 2015-07-23 DIAGNOSIS — K228 Other specified diseases of esophagus: Secondary | ICD-10-CM

## 2015-07-23 DIAGNOSIS — R079 Chest pain, unspecified: Secondary | ICD-10-CM | POA: Diagnosis not present

## 2015-07-23 DIAGNOSIS — I10 Essential (primary) hypertension: Secondary | ICD-10-CM | POA: Diagnosis not present

## 2015-07-23 DIAGNOSIS — C155 Malignant neoplasm of lower third of esophagus: Secondary | ICD-10-CM

## 2015-07-23 DIAGNOSIS — R0789 Other chest pain: Secondary | ICD-10-CM | POA: Diagnosis not present

## 2015-07-23 DIAGNOSIS — E785 Hyperlipidemia, unspecified: Secondary | ICD-10-CM | POA: Diagnosis not present

## 2015-07-23 DIAGNOSIS — C160 Malignant neoplasm of cardia: Secondary | ICD-10-CM | POA: Diagnosis not present

## 2015-07-23 DIAGNOSIS — C169 Malignant neoplasm of stomach, unspecified: Secondary | ICD-10-CM

## 2015-07-23 HISTORY — PX: ESOPHAGOGASTRODUODENOSCOPY: SHX5428

## 2015-07-23 HISTORY — DX: Malignant neoplasm of stomach, unspecified: C16.9

## 2015-07-23 LAB — GLUCOSE, CAPILLARY
GLUCOSE-CAPILLARY: 148 mg/dL — AB (ref 65–99)
Glucose-Capillary: 168 mg/dL — ABNORMAL HIGH (ref 65–99)

## 2015-07-23 SURGERY — EGD (ESOPHAGOGASTRODUODENOSCOPY)
Anesthesia: Monitor Anesthesia Care

## 2015-07-23 MED ORDER — IOHEXOL 300 MG/ML  SOLN
100.0000 mL | Freq: Once | INTRAMUSCULAR | Status: AC | PRN
  Administered 2015-07-23: 100 mL via INTRAVENOUS

## 2015-07-23 MED ORDER — BUTAMBEN-TETRACAINE-BENZOCAINE 2-2-14 % EX AERO
INHALATION_SPRAY | CUTANEOUS | Status: DC | PRN
  Administered 2015-07-23: 2 via TOPICAL

## 2015-07-23 MED ORDER — METRONIDAZOLE 0.75 % EX CREA
TOPICAL_CREAM | Freq: Two times a day (BID) | CUTANEOUS | Status: DC
  Filled 2015-07-23: qty 45

## 2015-07-23 MED ORDER — PROPOFOL 10 MG/ML IV BOLUS
INTRAVENOUS | Status: DC | PRN
  Administered 2015-07-23 (×2): 20 mg via INTRAVENOUS

## 2015-07-23 MED ORDER — IOHEXOL 300 MG/ML  SOLN
25.0000 mL | INTRAMUSCULAR | Status: AC
  Administered 2015-07-23 (×2): 25 mL via ORAL

## 2015-07-23 MED ORDER — METRONIDAZOLE 0.75 % EX GEL: Freq: Two times a day (BID) | CUTANEOUS | Status: DC

## 2015-07-23 MED ORDER — ASPIRIN 81 MG PO TABS: 81.0000 mg | ORAL_TABLET | Freq: Every day | ORAL | Status: DC

## 2015-07-23 MED ORDER — METRONIDAZOLE 0.75 % EX GEL
Freq: Two times a day (BID) | CUTANEOUS | Status: DC
  Filled 2015-07-23: qty 45

## 2015-07-23 MED ORDER — LACTATED RINGERS IV SOLN
INTRAVENOUS | Status: DC | PRN
  Administered 2015-07-23: 08:00:00 via INTRAVENOUS

## 2015-07-23 MED ORDER — PROPOFOL INFUSION 10 MG/ML OPTIME
INTRAVENOUS | Status: DC | PRN
  Administered 2015-07-23: 50 ug/kg/min via INTRAVENOUS

## 2015-07-23 MED FILL — Lidocaine HCl Local Preservative Free (PF) Inj 1%: INTRAMUSCULAR | Qty: 2 | Status: AC

## 2015-07-23 MED FILL — Nitroglycerin IV Soln 100 MCG/ML in D5W: INTRA_ARTERIAL | Qty: 10 | Status: AC

## 2015-07-23 NOTE — Progress Notes (Addendum)
     Subjective:  S/P cath yesterday/ EGD/Bx today  Objective:  Temp:  [97.7 F (36.5 C)-98.9 F (37.2 C)] 97.7 F (36.5 C) (08/11 0928) Pulse Rate:  [65-295] 76 (08/11 0945) Resp:  [0-58] 24 (08/11 0945) BP: (116-172)/(63-108) 172/74 mmHg (08/11 0945) SpO2:  [0 %-100 %] 100 % (08/11 0945) Weight:  [250 lb 14.4 oz (113.807 kg)] 250 lb 14.4 oz (113.807 kg) (08/11 0514) Weight change: 4 lb 11.2 oz (2.132 kg)  Intake/Output from previous day: 08/10 0701 - 08/11 0700 In: 227.6 [I.V.:227.6] Out: 200 [Urine:200]  Intake/Output from this shift: Total I/O In: 200 [I.V.:200] Out: -   Physical Exam: General appearance: alert and no distress Neck: no adenopathy, no carotid bruit, no JVD, supple, symmetrical, trachea midline and thyroid not enlarged, symmetric, no tenderness/mass/nodules Lungs: clear to auscultation bilaterally Heart: regular rate and rhythm, S1, S2 normal, no murmur, click, rub or gallop Extremities: extremities normal, atraumatic, no cyanosis or edema  Lab Results: Results for orders placed or performed during the hospital encounter of 07/20/15 (from the past 48 hour(s))  Glucose, capillary     Status: Abnormal   Collection Time: 07/21/15 11:39 AM  Result Value Ref Range   Glucose-Capillary 103 (H) 65 - 99 mg/dL  Glucose, capillary     Status: Abnormal   Collection Time: 07/21/15  4:12 PM  Result Value Ref Range   Glucose-Capillary 172 (H) 65 - 99 mg/dL  Glucose, capillary     Status: Abnormal   Collection Time: 07/21/15 10:25 PM  Result Value Ref Range   Glucose-Capillary 182 (H) 65 - 99 mg/dL  Glucose, capillary     Status: Abnormal   Collection Time: 07/22/15  7:38 AM  Result Value Ref Range   Glucose-Capillary 136 (H) 65 - 99 mg/dL  Glucose, capillary     Status: Abnormal   Collection Time: 07/22/15 11:40 AM  Result Value Ref Range   Glucose-Capillary 107 (H) 65 - 99 mg/dL  Glucose, capillary     Status: Abnormal   Collection Time: 07/22/15  4:23  PM  Result Value Ref Range   Glucose-Capillary 110 (H) 65 - 99 mg/dL  Glucose, capillary     Status: Abnormal   Collection Time: 07/22/15  9:07 PM  Result Value Ref Range   Glucose-Capillary 223 (H) 65 - 99 mg/dL  Glucose, capillary     Status: Abnormal   Collection Time: 07/23/15  7:33 AM  Result Value Ref Range   Glucose-Capillary 148 (H) 65 - 99 mg/dL    Imaging: Imaging results have been reviewed  Assessment/Plan:   1. Principal Problem: 2.   Chest pain 3. Active Problems: 4.   Diabetes mellitus, type 2 5.   Hyperlipidemia 6.   Essential hypertension 7.   Obstructive sleep apnea 8.   Coronary artery disease-status post CABG 9.   Automatic implantable cardioverter-defibrillator in situ 10.   Systolic and diastolic CHF, chronic 11.   PAD (peripheral artery disease) 12.   Pain in the chest 13.   Time Spent Directly with Patient:  10 minutes  Length of Stay:    Results of cath noted. Stable CAD (anatomy C/W MV abn). 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 Will check 2D as well to better quantify.  Cont Med Rx. Results of EGD noted. Distal esoph/gastric mass  With gastric ulcer. Bx. Results pending. Will S/O. Please call if we can be of further assistance.  Quay Burow 07/23/2015, 9:52 AM

## 2015-07-23 NOTE — Consult Note (Signed)
Subjective:   HPI  The patient is a 69 year old male who came into the hospital with complaints of chest pain. As part of his evaluation he had a CT Angio and it raised the question of thickening in the distal esophagus. The patient has been experiencing dysphagia for quite some time but he states he thought it was because he wasn't chewing his food because he does not have teeth. In any event we are asked to see him in regards to the abnormality in the esophagus and proceed with EGD. He has been seen by cardiology. Nuclear medicine my view study noted. Cardiology felt it was okay to proceed with EGD.  Review of Systems Currently he is not having chest pain or shortness of breath.  Past Medical History  Diagnosis Date  . Hyperlipidemia   . Hypertension   . Coronary heart disease     a. s/p CABG x 3 (VG->PDA, VG->PL, LIMA->LAD);  b. 2013 Abnl Myoview;  c. 2013 Cath: 3/3 patent grafts, occluded LCX which correlated w/ ischemia on MV->not amenable to PCI->Med Rx.  . Obesity   . CHF (congestive heart failure)   . VT (ventricular tachycardia)   . SVT (supraventricular tachycardia)   . PAD (peripheral artery disease)     Angiography in February of 2014: Moderate left iliac artery stenosis, mild to moderate diffuse right SFA disease. Severe proximal right popliteal artery stenosis with three-vessel runoff below the knee. Status post self-expanding stent placement to the proximal popliteal artery  . Implantable cardioverter-defibrillator Mdt   . Asthma     "small touch" (07/29/2013)  . History of pneumonia     "3-4 times; last time ~ 2003" (07/29/2013)  . OSA on CPAP   . Type II diabetes mellitus     on insulin  . GERD (gastroesophageal reflux disease)   . H/O hiatal hernia   . Gout   . Anxiety    Past Surgical History  Procedure Laterality Date  . Tonsillectomy    . Cardiac catheterization    . Wrist fracture surgery Right   . Cataract extraction w/ intraocular lens implant Left   .  Cardiac defibrillator placement  1997; ~ 2003  . Coronary artery bypass graft  1997; 2007    "X3" (07/29/2013)  . Nerve, tendon and artery repair Left 09/20/2013    Procedure: IRRIGATION AND DEBRIDEMENT,Exploration and Repair of Ulnar/Digital  Artery Nerve of Left Index finger;  Surgeon: Roseanne Kaufman, MD;  Location: [place] [facility];  Service: Orthopedics;  Laterality: Left;  . Abdominal aortagram N/A 01/30/2013    Procedure: ABDOMINAL Maxcine Ham;  Surgeon: Wellington Hampshire, MD;  Location: [facility] CATH LAB;  Service: Cardiovascular;  Laterality: N/A;  . Cardiac catheterization N/A 07/22/2015    Procedure: Left Heart Cath and Coronary Angiography;  Surgeon: Peter M Martinique, MD;  Location: [place] CV LAB;  Service: Cardiovascular;  Laterality: N/A;   Social History   Social History  . Marital Status: Single    Spouse Name: N/A  . Number of Children: 0  . Years of Education: N/A   Occupational History  . retired Surveyor, mining   .     Social History Main Topics  . Smoking status: Former Smoker -- 2.00 packs/day for 30 years    Types: Cigarettes    Quit date: 12/13/1995  . Smokeless tobacco: Never Used  . Alcohol Use: No     Comment: 07/29/2013 "not touched any alcohol since 1997"  . Drug Use: No  . Sexual Activity: Not  Currently   Other Topics Concern  . Not on file   Social History Narrative   family history includes Breast cancer in his mother; Cancer in his brother; Heart disease in his brother.  Current facility-administered medications:  .  [MAR Hold] 0.9 %  sodium chloride infusion, 250 mL, Intravenous, PRN, Peter M Martinique, MD .  Doug Sou Hold] 0.9 %  sodium chloride infusion, 250 mL, Intravenous, PRN, Peter M Martinique, MD .  Doug Sou Hold] allopurinol (ZYLOPRIM) tablet 300 mg, 300 mg, Oral, Daily, Ejiroghene E Emokpae, MD, 300 mg at 07/22/15 0848 .  [MAR Hold] aspirin chewable tablet 81 mg, 81 mg, Oral, Daily, Ejiroghene E Emokpae, MD, 81 mg at 07/22/15 0848 .  [MAR Hold] atorvastatin  (LIPITOR) tablet 40 mg, 40 mg, Oral, Daily, Ejiroghene E Emokpae, MD, 40 mg at 07/22/15 0848 .  [MAR Hold] famotidine (PEPCID) tablet 20 mg, 20 mg, Oral, BID, Ejiroghene E Emokpae, MD, 20 mg at 07/22/15 2122 .  [MAR Hold] HYDROcodone-acetaminophen (NORCO/VICODIN) 5-325 MG per tablet 1 tablet, 1 tablet, Oral, Q6H PRN, Liberty Handy, MD, 1 tablet at 07/21/15 2118 .  [MAR Hold] insulin aspart (novoLOG) injection 0-15 Units, 0-15 Units, Subcutaneous, TID WC, Ejiroghene Arlyce Dice, MD, 2 Units at 07/22/15 0848 .  [MAR Hold] insulin detemir (LEVEMIR) injection 40 Units, 40 Units, Subcutaneous, BID, Bethena Roys, MD, 40 Units at 07/22/15 2122 .  [MAR Hold] irbesartan (AVAPRO) tablet 150 mg, 150 mg, Oral, Daily, Ejiroghene E Emokpae, MD, 150 mg at 07/21/15 1512 .  [MAR Hold] levothyroxine (SYNTHROID, LEVOTHROID) tablet 75 mcg, 75 mcg, Oral, QAC breakfast, Ejiroghene E Denton Brick, MD, 75 mcg at 07/22/15 0848 .  [MAR Hold] metoprolol succinate (TOPROL-XL) 24 hr tablet 100 mg, 100 mg, Oral, Daily, Ejiroghene E Emokpae, MD, 100 mg at 07/22/15 0848 .  [MAR Hold] metroNIDAZOLE (METROGEL) 0.75 % gel, , Topical, BID, Annia Belt, MD .  Doug Sou Hold] nitroGLYCERIN (NITROSTAT) SL tablet 0.4 mg, 0.4 mg, Sublingual, Q5 Min x 3 PRN, Ejiroghene E Emokpae, MD, 0.4 mg at 07/20/15 1229 .  [MAR Hold] sodium chloride 0.9 % injection 3 mL, 3 mL, Intravenous, Q12H, Ejiroghene E Emokpae, MD, 3 mL at 07/22/15 2124 .  [MAR Hold] sodium chloride 0.9 % injection 3 mL, 3 mL, Intravenous, Q12H, Peter M Martinique, MD, 0 mL at 07/22/15 1900 .  [MAR Hold] sodium chloride 0.9 % injection 3 mL, 3 mL, Intravenous, PRN, Peter M Martinique, MD .  Doug Sou Hold] sodium chloride 0.9 % injection 3 mL, 3 mL, Intravenous, Q12H, Peter M Martinique, MD, 3 mL at 07/22/15 2200 .  [MAR Hold] sodium chloride 0.9 % injection 3 mL, 3 mL, Intravenous, PRN, Peter M Martinique, MD  Facility-Administered Medications Ordered in Other Encounters:  .  lactated ringers  infusion, , , Continuous PRN, Terrill Mohr, CRNA Allergies  Allergen Reactions  . Adhesive [Tape]     Pulls skin off  . Morphine And Related Nausea And Vomiting     Objective:     BP 172/89 mmHg  Pulse 76  Temp(Src) 98.4 F (36.9 C) (Oral)  Resp 23  Ht 6' (1.829 m)  Wt 113.807 kg (250 lb 14.4 oz)  BMI 34.02 kg/m2  SpO2 99%  Currently he is in no distress  Heart regular rhythm no murmurs  Lungs clear  Abdomen: Bowel sounds normal, soft, nontender  Laboratory No components found for: D1    Assessment:     Dysphagia with abnormal thickening in the distal esophagus  Plan:     Proceed with EGD today. Possible dilatation of the stricture is found. Possible biopsy. The procedure was explained to the patient along with the potential risks of bleeding infection and perforation, he understands and consents to the procedure.

## 2015-07-23 NOTE — Progress Notes (Signed)
Subjective: No acute events overnight Patient went to the EGD this morning. Objective: Vital signs in last 24 hours: Filed Vitals:   07/23/15 0930 07/23/15 0935 07/23/15 0940 07/23/15 0945  BP: 142/63 144/63 170/94 172/74  Pulse: 72 76 76 76  Temp:      TempSrc:      Resp: 22 22 23 24   Height:      Weight:      SpO2: 99% 97% 97% 100%   Weight change: 4 lb 11.2 oz (2.132 kg)  Intake/Output Summary (Last 24 hours) at 07/23/15 1304 Last data filed at 07/23/15 0911  Gross per 24 hour  Intake  427.6 ml  Output    200 ml  Net  227.6 ml   Patient was not present in the room when rounded so full PE could not be performed. Will update once I see him this afternoon.  Lab Results: @LABTEST2 @ Micro Results: No results found for this or any previous visit (from the past 240 hour(s)). Studies/Results: Nm Myocar Multi W/spect W/wall Motion / Ef  07/21/2015    There was no ST segment deviation noted during stress.  Defect 1: There is a large defect of severe severity present in the  basal inferolateral, mid inferolateral and apical lateral location.  Defect 2: There is a small defect of mild severity present in the mid  anterior location.  Findings consistent with ischemia and prior myocardial infarction.  This is a high risk study.  The left ventricular ejection fraction is moderately decreased (30-44%).  Nuclear stress EF: 32%.    Medications: I have reviewed the patient's current medications. Scheduled Meds: . allopurinol  300 mg Oral Daily  . aspirin  81 mg Oral Daily  . atorvastatin  40 mg Oral Daily  . famotidine  20 mg Oral BID  . insulin aspart  0-15 Units Subcutaneous TID WC  . insulin detemir  40 Units Subcutaneous BID  . iohexol  25 mL Oral Q1 Hr x 2  . irbesartan  150 mg Oral Daily  . levothyroxine  75 mcg Oral QAC breakfast  . metoprolol succinate  100 mg Oral Daily  . metroNIDAZOLE   Topical BID  . sodium chloride  3 mL Intravenous Q12H  . sodium chloride  3  mL Intravenous Q12H  . sodium chloride  3 mL Intravenous Q12H   Continuous Infusions:  PRN Meds:.sodium chloride, sodium chloride, HYDROcodone-acetaminophen, nitroGLYCERIN, sodium chloride, sodium chloride Assessment/Plan: Principal Problem:   Chest pain Active Problems:   Diabetes mellitus, type 2   Hyperlipidemia   Essential hypertension   Obstructive sleep apnea   Coronary artery disease-status post CABG   Automatic implantable cardioverter-defibrillator in situ   Systolic and diastolic CHF, chronic   PAD (peripheral artery disease)   Pain in the chest   Mass of esophagus determined by endoscopy   Stomach ulcer Chest pain- Due to the patient's extensive cardiac history, ACS rule out is necessary. The patient denies that this pain feels similar to past cardiac incidents, and the negatively trending troponins indicate this is probably not an ischemic event. More likely either a result of his CHF or something involving the new findings around his esophagus on CT - D/C tele - Cardiac catheterization showed patent CABG with no worsening of stenosis -echo today - EF 32% by nuclear stress test - Troponins negative x3 - EKG continues to show no changes - Cont meds- Metop, ARBs and Lipitor - Continues ASA 81mg  daily   Esophogeal thickening: Incidental  finding on CT showed distal thickening of the esophagus with pathologically enlarged lymph nodes - Consulted GI - EGD showed a mass in distal esophagus and cardia of the stomach with a large ulcer present. Patial obstruction of esophageal lumen - CT abdomen and pelvis with and without contrast ordered - F/u pathology on biopsy of mass  Chronic CHF s/p ICD- Last measured EF 40-45% in 2013. Pt has chronic stable orthopnea and no overt signs of acute fluid overload - Chest xray shows some mild interstitial edema - Cont metop and lorsatan. - Consider echo if CHF exacerbation occurs  Diabetes-  - A1C 6.8% - Hold Glipizide and  Liraglutide - SSI-M - Levemir 40u BID  DVT ppx- Lovenox  Dispo: Disposition is deferred at this time, awaiting improvement and evaluation of current medical problems.   Dispo: Disposition is deferred at this time, awaiting improvement of current medical problems.  Anticipated discharge in approximately 0 day(s).   The patient does have a current PCP Karlene Einstein, MD) and does need an [facility] hospital follow-up appointment after discharge.  The patient does not have transportation limitations that hinder transportation to clinic appointments.  .Services Needed at time of discharge: Y = Yes, Blank = No PT:   OT:   RN:   Equipment:   Other:       Burgess Estelle, MD 07/23/2015, 1:04 PM

## 2015-07-23 NOTE — Anesthesia Preprocedure Evaluation (Addendum)
Anesthesia Evaluation  Patient identified by MRN, date of birth, ID band Patient awake    Reviewed: Allergy & Precautions, NPO status , Patient's Chart, lab work & pertinent test results, reviewed documented beta blocker date and time   Airway Mallampati: II  TM Distance: >3 FB Neck ROM: Limited    Dental  (+) Edentulous Upper, Dental Advisory Given, Missing   Pulmonary former smoker,  Pt generally does not use his CPAP breath sounds clear to auscultation        Cardiovascular hypertension, Pt. on medications + CAD, + CABG and +CHF + Cardiac Defibrillator Rhythm:Regular Rate:Normal  CABG in '97 and 2007.  AICD has never discharged   Neuro/Psych    GI/Hepatic GERD-  Medicated and Controlled,  Endo/Other  diabetes, Poorly Controlled, Type 2, Insulin Dependent  Renal/GU      Musculoskeletal   Abdominal   Peds  Hematology   Anesthesia Other Findings One lower left tooth  Reproductive/Obstetrics                            Anesthesia Physical Anesthesia Plan  ASA: III  Anesthesia Plan: MAC   Post-op Pain Management:    Induction: Intravenous  Airway Management Planned: Natural Airway and Nasal Cannula  Additional Equipment:   Intra-op Plan:   Post-operative Plan:   Informed Consent: I have reviewed the patients History and Physical, chart, labs and discussed the procedure including the risks, benefits and alternatives for the proposed anesthesia with the patient or authorized representative who has indicated his/her understanding and acceptance.     Plan Discussed with: CRNA  Anesthesia Plan Comments:         Anesthesia Quick Evaluation

## 2015-07-23 NOTE — Transfer of Care (Signed)
Immediate Anesthesia Transfer of Care Note  Patient: Walter French  Procedure(s) Performed: Procedure(s): ESOPHAGOGASTRODUODENOSCOPY (EGD) (N/A)  Patient Location: Endoscopy Unit  Anesthesia Type:MAC  Level of Consciousness: awake, alert , oriented and patient cooperative  Airway & Oxygen Therapy: Patient Spontanous Breathing and Patient connected to nasal cannula oxygen  Post-op Assessment: Report given to RN, Post -op Vital signs reviewed and stable and Patient moving all extremities  Post vital signs: Reviewed and stable  Last Vitals:  Filed Vitals:   07/23/15 0815  BP: 172/89  Pulse: 76  Temp: 36.9 C  Resp: 23    Complications: No apparent anesthesia complications

## 2015-07-23 NOTE — Anesthesia Postprocedure Evaluation (Signed)
  Anesthesia Post-op Note  Patient: Walter French  Procedure(s) Performed: Procedure(s): ESOPHAGOGASTRODUODENOSCOPY (EGD) (N/A)  Patient Location: Endoscopy Unit  Anesthesia Type:MAC  Level of Consciousness: awake, alert , oriented and patient cooperative  Airway and Oxygen Therapy: Patient Spontanous Breathing, Patient connected to nasal cannula oxygen and talking and joking  Post-op Pain: none  Post-op Assessment: Post-op Vital signs reviewed, Patient's Cardiovascular Status Stable, Respiratory Function Stable, Patent Airway and No signs of Nausea or vomiting              Post-op Vital Signs: Reviewed and stable  Last Vitals:  Filed Vitals:   07/23/15 0815  BP: 172/89  Pulse: 76  Temp: 36.9 C  Resp: 23    Complications: No apparent anesthesia complications

## 2015-07-23 NOTE — Addendum Note (Signed)
Addendum  created 07/23/15 1030 by Terrill Mohr, CRNA   Modules edited: Anesthesia Attestations

## 2015-07-23 NOTE — Discharge Summary (Signed)
Name: Walter French MRN: [id number] DOB: November 16, 1946 69 y.o. PCP: Karlene Einstein, MD  Date of Admission: 07/20/2015  5:10 AM Date of Discharge: 07/23/2015 Attending Physician: Annia Belt, MD  Discharge Diagnosis: 1. Adenocarcinoma of distal esophagus  Principal Problem:   Chest pain Active Problems:   Diabetes mellitus, type 2   Hyperlipidemia   Essential hypertension   Obstructive sleep apnea   Coronary artery disease-status post CABG   Automatic implantable cardioverter-defibrillator in situ   Systolic and diastolic CHF, chronic   PAD (peripheral artery disease)   Pain in the chest   Mass of esophagus determined by endoscopy   Stomach ulcer  Discharge Medications:   Medication List    STOP taking these medications        HYDROcodone-acetaminophen 5-325 MG per tablet  Commonly known as:  NORCO/VICODIN      TAKE these medications        allopurinol 300 MG tablet  Commonly known as:  ZYLOPRIM  Take 300 mg by mouth daily.     aspirin 81 MG tablet  Take 1 tablet (81 mg total) by mouth daily.     atorvastatin 40 MG tablet  Commonly known as:  LIPITOR  Take 1 tablet (40 mg total) by mouth daily.     glipiZIDE 10 MG tablet  Commonly known as:  GLUCOTROL  Take 10 mg by mouth 2 (two) times daily before a meal.     insulin detemir 100 UNIT/ML injection  Commonly known as:  LEVEMIR  Inject 85 Units into the skin 2 (two) times daily.     levothyroxine 75 MCG tablet  Commonly known as:  SYNTHROID, LEVOTHROID  Take 75 mcg by mouth daily before breakfast.     metoprolol succinate 100 MG 24 hr tablet  Commonly known as:  TOPROL-XL  Take 1 tablet (100 mg total) by mouth daily. Take with or immediately following a meal.     metroNIDAZOLE 0.75 % gel  Commonly known as:  METROGEL  Apply topically 2 (two) times daily.     nitroGLYCERIN 0.4 MG SL tablet  Commonly known as:  NITROSTAT  Place 1 tablet (0.4 mg total) under the tongue every 5 (five) minutes x 3  doses as needed for chest pain.     NOVOLOG FLEXPEN 100 UNIT/ML FlexPen  Generic drug:  insulin aspart  Inject 20 Units as directed daily.     ranitidine 300 MG tablet  Commonly known as:  ZANTAC  Take 1 tablet (300 mg total) by mouth daily as needed for heartburn.     valsartan 160 MG tablet  Commonly known as:  DIOVAN  Take 1 tablet (160 mg total) by mouth daily.     VICTOZA 18 MG/3ML Sopn  Generic drug:  Liraglutide  Inject 1.8 mg into the skin daily.        Disposition and follow-up:   Walter French was discharged from [facility] in Stable condition.  At the hospital follow up visit please address:  1.  Oncology follow up- Please see the results of the CT abd/pelvis and EGD       2.  Labs / imaging needed at time of follow-up:  Per Dr. Benay Spice  3.  Pending labs/ test needing follow-up:   Follow-up Appointments:     Follow-up Information    Follow up with Betsy Coder, MD. Call in 1 day.   Specialty:  Oncology   Why:  To make appointment at the  cancer center    Contact information:   [place] [id number] [phone]       Discharge Instructions: Discharge Instructions    Call MD for:  difficulty breathing, headache or visual disturbances    Complete by:  As directed      Call MD for:  severe uncontrolled pain    Complete by:  As directed      Call MD for:  temperature >100.4    Complete by:  As directed      Discharge instructions    Complete by:  As directed   Please do PUREED DIET  Please Call the Cancer doctor to make the appointment  as listed in your paperwork- Dr Beryle Beams has already communicated with him regarding you.     Increase activity slowly    Complete by:  As directed            Consultations: Treatment Team:  Wonda Horner, MD  Procedures Performed:  Dg Chest 2 View  07/20/2015   CLINICAL DATA:  Acute onset of shortness of breath and generalized chest pain. Initial encounter.   EXAM: CHEST  2 VIEW  COMPARISON:  Chest radiograph performed 09/20/2013  FINDINGS: The lungs are well-aerated. Mild vascular congestion is noted, with mildly increased interstitial markings, raising question for mild interstitial edema. There is no evidence of pleural effusion or pneumothorax.  The heart is mildly enlarged. The patient is status post median sternotomy. Multiple fractured sternal wires are seen. An AICD is noted at the left chest wall with a single lead ending at the right ventricle. No acute osseous abnormalities are identified.  IMPRESSION: Mild vascular congestion and mild cardiomegaly, with slightly increased interstitial markings, raising concern for mild interstitial edema.   Electronically Signed   By: Garald Balding M.D.   On: 07/20/2015 06:17   Nm Myocar Multi W/spect W/wall Motion / Ef  07/21/2015    There was no ST segment deviation noted during stress.  Defect 1: There is a large defect of severe severity present in the  basal inferolateral, mid inferolateral and apical lateral location.  Defect 2: There is a small defect of mild severity present in the mid  anterior location.  Findings consistent with ischemia and prior myocardial infarction.  This is a high risk study.  The left ventricular ejection fraction is moderately decreased (30-44%).  Nuclear stress EF: 32%.    Ct Angio Chest Aorta W/cm &/or Wo/cm  07/20/2015   CLINICAL DATA:  Diffuse chest pain  EXAM: CT ANGIOGRAPHY CHEST WITH CONTRAST  TECHNIQUE: Multidetector CT imaging of the chest was performed using the standard protocol during bolus administration of intravenous contrast. Multiplanar CT image reconstructions and MIPs were obtained to evaluate the vascular anatomy.  CONTRAST:  116mL OMNIPAQUE IOHEXOL 350 MG/ML SOLN  COMPARISON:  None.  FINDINGS: Mediastinum: The heart size appears moderately enlarged. There is no pericardial effusion identified. Status post median sternotomy and CABG procedure. Nonunion of  sternotomy defect noted. There is abnormal wall thickening involving the distal esophagus. There are prominent lymph nodes surrounding the distal esophagus. Index node on the right measures 1.6 cm, image 80/series 401. Just below the diaphragm there is a gastrohepatic ligament node measuring 1.3 cm, image 92 of series 401. The trachea appears patent and is midline.  The main pulmonary artery appears normal. No lobar or segmental pulmonary artery filling defects identified. There is no lobar or segmental pulmonary artery filling defects to suggest a clinically significant acute  pulmonary embolus.  Lungs/Pleura: No pleural effusion identified. No airspace consolidation. Calcified granuloma is identified within the right upper lobe, image 32 of series 402. There is no suspicious pulmonary nodule or mass identified.  Upper Abdomen: The visualized portions of the liver are unremarkable. The visualized spleen is normal.  Musculoskeletal: Review of the visualized bony structures is negative for aggressive lytic or sclerotic bone lesion.  Review of the MIP images confirms the above findings.  IMPRESSION: 1. No evidence for an acute pulmonary embolus. 2. Abnormal wall thickening involving the distal esophagus. Additionally, there are several adjacent pathologically enlarged lymph nodes. This is a worrisome finding in further investigation with upper endoscopy is advise. 3. 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   Electronically Signed   By: Kerby Moors M.D.   On: 07/20/2015 08:49    2D Echo: EF about 78%, grade 2 diastolic dysfunction  Cardiac Cath: 1. Severe 3 vessel obstructive CAD 2. Patent LIMA to the LAD 3. Patent SVG to PDA 4. Patent SVG to PLOM 5. Elevated LVEDP  Admission HPI:  62 y o m with PMH of 3 vessel CABG, CHF s/p ICD placement, DM2, HLD, HTN, and OSA. Presented with c/o Chest pain that started about 3-4 days prior to admission, was present mildly when he is going to bed,  but over the next two days, he woke up suddenly from sleep at about 3:30 am with pain in his upper abdomen and chest bilaterally, and in the ED he reported pain in his upper shoulders, left more so than right. He denied associated SOB, nausea, diaphoresis. He cannot tell if the pain is worse with breathing, or with exertion, no aggravating factors, but pain was relieved by fentanyl given in the ED.  He has a PRN prescription for Nitroglycerin which he did not use, because he felt this pain was similar to pain he had in the past when he was told he had fluid in his lungs. When he had CABG and stents he never had preceding chest pain. He takes NTG for as needed chest pain, and this helps. He has been compliant with his daily aspirin, metoprolol, and Valsartan.   He has stable 2 pillow orthopnea that has not changed, he has stable dyspnea on exertion that has not changed, and the patient denied any change in bowel habits, vomiting, dark stools, or fever.  Hospital Course by problem list: Principal Problem:   Chest pain Active Problems:   Diabetes mellitus, type 2   Hyperlipidemia   Essential hypertension   Obstructive sleep apnea   Coronary artery disease-status post CABG   Automatic implantable cardioverter-defibrillator in situ   Systolic and diastolic CHF, chronic   PAD (peripheral artery disease)   Pain in the chest   Mass of esophagus determined by endoscopy   Stomach ulcer   1. Chest Pain: The patient was admitted for an ACS rule out given his extensive cardiac history. On admission EKG was unchanged and troponins were negative x3. Cardiology was consulted. The following day (8/9) a cardiac stress test was performed and showed severe abnormalities in the inferolateral portions of the heart and an ejection fraction of 32%, prompting a cardiac catheterization the following day. This showed patent bypass vessels and some improvement in the anastomotic stenosis in the distal LAD. Due to the  patient's CHF history, an echocardiogram was ordered on the day of discharge and showed LVEF of 30-35% and grade 2 diastolic dysfunction. His chest pain had resolved by  the time of discharge and he was being followed by Cardiology who cleared him for discharge.  Esophageal malignancy: During the workup for his chest pain, the patient was found to have thickening of the distal esophagus and pathologically enlarged lymph nodes on CT Angio. Gastroenterology was consulted and recommended an upper endoscopy, which was performed on 8/11. A mass was found extending from the distal esophagus to the cardia of the stomach and a large ulcer. Biopsy was performed and sent to pathology as the mass was concerning for malignancy. A CT of the abdomen was also ordered for staging purposes. Biopsy revealed distal adenocarcinoma. CT abdomen revealed distal esophageal primary CA with possible extension to stomach cardia and adjacent adenopathy consistent with nodal metastasis. There was no metastasis in the liver or abdomen/pelvis.  The patient was sent home on a soft diet and scheduled for followup with oncology. The following Friday morning, patient was informed of the results of the biopsy by me and he will follow up with Dr. Benay Spice.   Diabetes: [name] home medications of glipizide and liraglutide were held upon admission and resumed upon discharge. While in the hospital the patient was placed on a sliding scale insulin regiment with POCT for blood glucose levels. He was continued on his home Levemir but at a dose of 40 units twice daily.   Discharge Vitals:   BP 172/74 mmHg  Pulse 76  Temp(Src) 97.7 F (36.5 C) (Oral)  Resp 24  Ht 6' (1.829 m)  Wt 250 lb 14.4 oz (113.807 kg)  BMI 34.02 kg/m2  SpO2 100%  Discharge Labs:  Results for orders placed or performed during the hospital encounter of 07/20/15 (from the past 24 hour(s))  Glucose, capillary     Status: Abnormal   Collection Time: 07/22/15  4:23 PM   Result Value Ref Range   Glucose-Capillary 110 (H) 65 - 99 mg/dL  Glucose, capillary     Status: Abnormal   Collection Time: 07/22/15  9:07 PM  Result Value Ref Range   Glucose-Capillary 223 (H) 65 - 99 mg/dL  Glucose, capillary     Status: Abnormal   Collection Time: 07/23/15  7:33 AM  Result Value Ref Range   Glucose-Capillary 148 (H) 65 - 99 mg/dL  Glucose, capillary     Status: Abnormal   Collection Time: 07/23/15 11:42 AM  Result Value Ref Range   Glucose-Capillary 168 (H) 65 - 99 mg/dL    Signed: Burgess Estelle, MD 07/23/2015, 2:55 PM    Services Ordered on Discharge:  Equipment Ordered on Discharge:

## 2015-07-23 NOTE — Progress Notes (Signed)
  Echocardiogram 2D Echocardiogram has been performed.  Darlina Sicilian M 07/23/2015, 3:10 PM

## 2015-07-23 NOTE — Op Note (Signed)
[facility] [place], [id number]   ENDOSCOPY PROCEDURE REPORT  PATIENT: [name], [name]  MR#: [id number] BIRTHDATE: 08-08-1946 , 37  yrs. old GENDER: male ENDOSCOPIST: Acquanetta Sit, MD REFERRED BY: PROCEDURE DATE:  08/21/2015 PROCEDURE:  EGD with biopsy ASA CLASS:     3 INDICATIONS:  dysphagia, abnormal wall thickening in distal esophagus on CT scan MEDICATIONS: propofol per anesthesia TOPICAL ANESTHETIC: Cetacaine spray  DESCRIPTION OF PROCEDURE: After the risks benefits and alternatives of the procedure were thoroughly explained, informed consent was obtained.  The Pentax Gastroscope [id number] endoscope was introduced through the mouth and advanced to the second portion of the duodenum , Without limitations.  The instrument was slowly withdrawn as the mucosa was fully examined. Estimated blood loss is zero unless otherwise noted in this procedure report.  Findings:  Esophagus: The upper and midesophagus looked normal. At 35 cm however a beginning of a mass is noted and this mass continues down the rest of the distal esophagus to the esophagogastric junction region then it is continuous into the cardia of the stomach where a very large ulcer is present. This mass is partially obstructing the esophageal lumen. Biopsies were obtained from the esophageal mass.  Stomach: Large ulcerated area in the cardia of the stomach. Biopsied.  The rest of the stomach looks normal.  Duodenum: Normal.    The scope was then withdrawn from the patient and the procedure completed.  COMPLICATIONS: There were no immediate complications.  ENDOSCOPIC IMPRESSION:esophageal mass extending into stomach with ulceration in cardia of stomach. Biopsies obtained. It is unclear whether this mass starts in the esophagus and extends distally or starts in the stomach and extends proximally. Pathology is pending. This appears to be obviously  malignant.   RECOMMENDATIONS:check biopsies   REPEAT EXAM:  eSigned:  Acquanetta Sit, MD 08-21-2015 9:31 AM    CC:  CPT CODES: ICD CODES:  The ICD and CPT codes recommended by this software are interpretations from the data that the clinical staff has captured with the software.  The verification of the translation of this report to the ICD and CPT codes and modifiers is the sole responsibility of the health care institution and practicing physician where this report was generated.  [employer] will not be held responsible for the validity of the ICD and CPT codes included on this report.  AMA assumes no liability for data contained or not contained herein. CPT is a Designer, television/film set of the [employer].  PATIENT NAME:  Walter French, Walter French MR#: [id number]

## 2015-07-23 NOTE — Discharge Instructions (Signed)
Please do PUREED DIET  Please Call the Cancer doctor to make the appointment  as listed in your paperwork- Dr Beryle Beams has already communicated with him regarding you.

## 2015-07-23 NOTE — Progress Notes (Signed)
Subjective: Patient was somewhat upset since he was still in the hospital. He had just learned from the GI doctor that he likely has a malignancy and was upset about that as well. He denies any chest pain or difficulty breathing but did endorse "irritation" substernally and in his back. He also stated that he felt like he had "fluid" in his back, but did not endorse more orthopnea or edema than usual  Objective: Vital signs in last 24 hours: Filed Vitals:   07/23/15 0930 07/23/15 0935 07/23/15 0940 07/23/15 0945  BP: 142/63 144/63 170/94 172/74  Pulse: 72 76 76 76  Temp:      TempSrc:      Resp: 22 22 23 24   Height:      Weight:      SpO2: 99% 97% 97% 100%   Weight change: 2.132 kg (4 lb 11.2 oz)  Intake/Output Summary (Last 24 hours) at 07/23/15 1154 Last data filed at 07/23/15 0911  Gross per 24 hour  Intake  427.6 ml  Output    200 ml  Net  227.6 ml   BP 172/74 mmHg  Pulse 76  Temp(Src) 97.7 F (36.5 C) (Oral)  Resp 24  Ht 6' (1.829 m)  Wt 113.807 kg (250 lb 14.4 oz)  BMI 34.02 kg/m2  SpO2 100%  General Appearance:    Alert, cooperative, no distress, appears stated age  Head:    Normocephalic, without obvious abnormality, atraumatic  Back:     Symmetric, no curvature, ROM normal, no CVA tenderness  Lungs:     Clear to auscultation bilaterally, respirations unlabored  Chest wall:    No tenderness or deformity  Heart:    Regular rate and rhythm, S1 and S2 normal, no murmur, rub   or gallop  Abdomen:     Soft, non-tender, no masses,   Extremities:   Extremities normal, atraumatic, no cyanosis or edema  Pulses:   2+ and symmetric all extremities  Skin:   Skin color, texture, turgor normal, no rashes or lesions   Lab Results: Results for orders placed or performed during the hospital encounter of 07/20/15 (from the past 24 hour(s))  Glucose, capillary     Status: Abnormal   Collection Time: 07/22/15  4:23 PM  Result Value Ref Range   Glucose-Capillary 110 (H) 65 -  99 mg/dL  Glucose, capillary     Status: Abnormal   Collection Time: 07/22/15  9:07 PM  Result Value Ref Range   Glucose-Capillary 223 (H) 65 - 99 mg/dL  Glucose, capillary     Status: Abnormal   Collection Time: 07/23/15  7:33 AM  Result Value Ref Range   Glucose-Capillary 148 (H) 65 - 99 mg/dL  Glucose, capillary     Status: Abnormal   Collection Time: 07/23/15 11:42 AM  Result Value Ref Range   Glucose-Capillary 168 (H) 65 - 99 mg/dL   Micro Results: No results found for this or any previous visit (from the past 240 hour(s)). Studies/Results: Nm Myocar Multi W/spect W/wall Motion / Ef  07/21/2015    There was no ST segment deviation noted during stress.  Defect 1: There is a large defect of severe severity present in the  basal inferolateral, mid inferolateral and apical lateral location.  Defect 2: There is a small defect of mild severity present in the mid  anterior location.  Findings consistent with ischemia and prior myocardial infarction.  This is a high risk study.  The left ventricular ejection fraction is moderately  decreased (30-44%).  Nuclear stress EF: 32%.    Medications: I have reviewed the patient's current medications. Scheduled Meds: . allopurinol  300 mg Oral Daily  . aspirin  81 mg Oral Daily  . atorvastatin  40 mg Oral Daily  . famotidine  20 mg Oral BID  . insulin aspart  0-15 Units Subcutaneous TID WC  . insulin detemir  40 Units Subcutaneous BID  . iohexol  25 mL Oral Q1 Hr x 2  . irbesartan  150 mg Oral Daily  . levothyroxine  75 mcg Oral QAC breakfast  . metoprolol succinate  100 mg Oral Daily  . metroNIDAZOLE   Topical BID  . sodium chloride  3 mL Intravenous Q12H  . sodium chloride  3 mL Intravenous Q12H  . 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 The patient denies that this pain feels similar to past cardiac incidents, and the negatively trending troponins indicate this is probably not an ischemic event. More likely either a result of his CHF or something involving the new findings around his esophagus on CT - D/C tele - Cardiac catheterization showed patent CABG with no worsening of stenosis - EF 32% by nuclear stress test - Troponins negative x3 - EKG continues to show no changes - Cont meds- Metop, ARBs and Lipitor - Continues ASA 81mg  daily  Esophogeal thickening: Incidental finding on CT showed distal thickening of the esophagus with pathologically enlarged lymph nodes - Consulted GI - EGD showed a mass in distal esophagus and cardia of the stomach with a large ulcer present. Patial obstruction of esophageal lumen - CT abdomen - F/u pathology on biopsy of mass  Chronic CHF s/p ICD- Last measured EF 40-45% in 2013. Pt has chronic stable orthopnea and no overt signs of acute fluid overload - Chest xray shows some mild interstitial edema - Cont metop and lorsatan. - Consider echo if CHF exacerbation occurs  Diabetes-  - A1C 6.8% - Hold Glipizide and Liraglutide - SSI-M - Levemir 40u BID  DVT ppx- Lovenox  Dispo: Disposition is deferred at this time, awaiting improvement and evaluation of current medical problems.   This is a Careers information officer Note.  The care of the patient was discussed with Dr. Burgess Estelle and the assessment and plan formulated with their  assistance.  Please see their attached note for official documentation of the daily encounter.     Sandria Manly, Med Student 07/23/2015, 11:54 AM

## 2015-07-23 NOTE — Progress Notes (Signed)
Patient ID: Walter French, male   DOB: July 16, 1946, 69 y.o.   MRN: [id number] Medicine attending discharge note: I personally examined this patient on the day of discharge and reviewed test results with the patient and his family. I attest to the accuracy of the evaluation and plan as recorded by resident physician Dr. Burgess French.  Clinical summary: 69 year old man with advanced coronary artery disease, status post previous MI, status post bypass surgery, estimated ejection fraction 40-45 percent, status post placement of an implantable defibrillator for ventricular tachycardia. He has hypertension, hyperlipidemia, type 2 diabetes now insulin-dependent, GERD, and gout. He presents at the current time with a 3 to four-day history of nasal congestion, low-grade cough, then atypical chest pain primarily in the upper abdomen radiating to the lower chest. He did not feel that his symptoms were cardiac although he states that he had similar symptoms when he developed fluid overload in the past. He saw his primary care physician. He was started on antibiotics for presumed respiratory infection. When the atypical chest pain woke him from sleep he drove himself to the emergency room for further evaluation. Pain was not relieved by nitroglycerin. He was treated with fentanyl for pain relief (intolerant to morphine). Electrocardiogram showed no acute ischemic changes. Serum troponin levels 3 not elevated. BNP 217. Chest x-ray showed mild cardiomegaly and mild vascular congestion. A CT angiogram did not show evidence for pulmonary emboli. Incidentally noted was thickening of the distal esophagus and port appear to be pathologically enlarged paraesophageal lymph nodes.  Hospital course: He was seen in consultation by cardiology. In view of his known advanced coronary disease, a Myoview stress test was done and showed significant areas of ischemia. Therefore, he was taken to cardiac catheterization on August 10.  Previous severe triple-vessel disease re-demonstrated. However, all of his bypass grafts were found to be patent and there appeared  to be some improvement in the previously noted stenosis at the IMA anastomotic site. Subsequent echocardiogram done on August 11 showed moderate to severe reduction in systolic function with estimated ejection fraction 30-35 percent and grade 2 diastolic dysfunction as well.  Gastroenterology was consulted for further evaluation of the abnormaly thickened distal esophagus and periesophageal lymphadenopathy incidentally noted on CT scan. Upper endoscopy performed on August 11 by Dr. Penelope French. A mass was seen starting at 35 cm extending past the GE junction and extending down to the gastric cardia where there was a large ulceration which was biopsied.  A CT scan of the abdomen and pelvis was done to complete staging. This confirmed a distal esophageal tumor with extension to the gastric cardia, adjacent periesophageal and gastrohepatic ligament adenopathy and additional mild, portacaval adenopathy-hepatic ligament adenopathy with smaller nodes also identified along the lesser curvature of the stomach. No abdominal or pelvic adenopathy. No obvious liver involvement. Mild splenomegaly.  Disposition: Condition stable at time of discharge. Likely cancer diagnosis discussed with patient and multiple family members at the bedside. Family now tells [place] that the patient has had progressive dysphagia over the last 3 months. The patient himself did not feel that his current symptoms were cardiac in nature at time of admission. I have called one of my colleagues, Dr. Benay French who specializes in GI oncology at the [facility]. He will try to get the patient on his scheduled next week for further evaluation and treatment. Given significant locally advanced disease and other severe cardiac comorbidities, he will likely not be a candidate for aggressive surgery but he should be  able to get good palliation with combined chemotherapy and radiation therapy.  There were no complications.

## 2015-07-24 ENCOUNTER — Encounter (HOSPITAL_COMMUNITY): Payer: Self-pay | Admitting: Gastroenterology

## 2015-07-24 ENCOUNTER — Telehealth: Payer: Self-pay | Admitting: Oncology

## 2015-07-24 ENCOUNTER — Telehealth: Payer: Self-pay | Admitting: Internal Medicine

## 2015-07-24 NOTE — Telephone Encounter (Signed)
Informed patient of the biopsy and CT results Pt confirmed that he will get in touch with Dr Benay Spice next week at [phone] if he does not hear from them

## 2015-07-24 NOTE — Telephone Encounter (Signed)
New patient appt-s/w patient and gave np appt for 08/18 @ 1:30 w/Dr. Benay Spice. Dx-Esophageal ca

## 2015-07-30 ENCOUNTER — Encounter: Payer: Self-pay | Admitting: Oncology

## 2015-07-30 ENCOUNTER — Ambulatory Visit (HOSPITAL_BASED_OUTPATIENT_CLINIC_OR_DEPARTMENT_OTHER): Payer: Medicare Other | Admitting: Oncology

## 2015-07-30 ENCOUNTER — Telehealth: Payer: Self-pay | Admitting: Oncology

## 2015-07-30 ENCOUNTER — Ambulatory Visit: Payer: Medicare Other

## 2015-07-30 VITALS — BP 157/89 | HR 81 | Temp 97.7°F | Resp 18 | Ht 72.0 in | Wt 249.3 lb

## 2015-07-30 DIAGNOSIS — I509 Heart failure, unspecified: Secondary | ICD-10-CM | POA: Diagnosis not present

## 2015-07-30 DIAGNOSIS — E119 Type 2 diabetes mellitus without complications: Secondary | ICD-10-CM

## 2015-07-30 DIAGNOSIS — I739 Peripheral vascular disease, unspecified: Secondary | ICD-10-CM

## 2015-07-30 DIAGNOSIS — Z809 Family history of malignant neoplasm, unspecified: Secondary | ICD-10-CM

## 2015-07-30 DIAGNOSIS — C160 Malignant neoplasm of cardia: Secondary | ICD-10-CM

## 2015-07-30 DIAGNOSIS — C159 Malignant neoplasm of esophagus, unspecified: Secondary | ICD-10-CM

## 2015-07-30 DIAGNOSIS — C155 Malignant neoplasm of lower third of esophagus: Secondary | ICD-10-CM | POA: Diagnosis not present

## 2015-07-30 DIAGNOSIS — Z803 Family history of malignant neoplasm of breast: Secondary | ICD-10-CM

## 2015-07-30 DIAGNOSIS — Z87891 Personal history of nicotine dependence: Secondary | ICD-10-CM

## 2015-07-30 MED ORDER — TRAMADOL HCL 50 MG PO TABS: 50.0000 mg | ORAL_TABLET | Freq: Four times a day (QID) | ORAL | Status: DC | PRN

## 2015-07-30 NOTE — Progress Notes (Signed)
Checked in new pt with no financial concerns. °

## 2015-07-30 NOTE — Progress Notes (Signed)
Oncology Nurse Navigator Documentation  Oncology Nurse Navigator Flowsheets 07/24/2015  Referral date to RadOnc/MedOnc 07/24/2015  Navigator Encounter Type Initial MedOnc  Patient Visit Type Medonc  Treatment Phase Treatment planning  Barriers/Navigation Needs Education;Family concerns  Education Understanding Cancer/ Treatment Options;Pain Management;Newly Diagnosed Cancer Education  Interventions Coordination of Care;Referrals;Education Method  Referrals Nutrition/dietician;ACS  Education Method Verbal;Written;Teach-back  Support Groups/Services GI;[facility]  Time Spent with Patient 60  Met with patient, his friend Tamela Oddi and her children during new patient visit. Explained the role of the GI Nurse Navigator and provided New Patient Packet with information on: 1. Esophageal cancer 2. Support groups 3. Advanced Directives 4. Fall Safety Plan Answered questions, reviewed current treatment plan using TEACH back and provided emotional support. Provided copy of current treatment plan. Encouraged patient to use his church family,friends for support. 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 [name] is a friend of his for 73 years and she is a cancer survivor and has had treatment at [facility]. She helps him with his bills. Durante is independent in ADL's and able to drive himself. Called radiation oncology requesting he see Dr. Lisbeth Renshaw asap. Needs dietician due to weight loss, dysphagia and has no teeth. Instructed to follow his blood sugars and call if >400. Explained procedure for chemo class and PET scan.  Merceda Elks, RN, BSN GI Oncology [place]

## 2015-07-30 NOTE — Progress Notes (Signed)
[facility] New Patient Consult   Referring MD: Karlene Einstein, Md [address] [place], [place] [address]   Walter French 69 y.o.  [birth year]    Reason for Referral: Adenocarcinoma of the GE junction   HPI: Walter French was admitted 07/20/2015 with chest pain. A chest CT revealed abnormal wall thickening at the distal esophagus with prominent paraesophageal lymph nodes. A gastrohepatic node measured 1.3 cm. He underwent a cardiac stress test and cardiac catheterization procedure. The cardiac catheterization showed patent bypass vessels. An echocardiogram revealed an LVEF of 30-35% with grade 2 diastolic dysfunction.  Gastroenterology was consulted and he was taken to an upper endoscopy procedure on 07/23/2015. At 35 cm a mass was noted extending to the GE junction and gastric cardia. An ulcer was present in the gastric cardia. Biopsies were obtained from the esophagus mass and gastric cardia ulcer. 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  He was referred for CTs of the abdomen and pelvis on 07/23/2015. Distal esophageal wall thickening was again noted with paraesophageal adenopathy. No focal liver lesion. Normal adrenal glands. Soft tissue thickening extends to the gastric cardia. A 1.8 cm portacaval node and a 1.3 severe gastrohepatic node were seen. Smaller nodes at the lesser curvature of the stomach. Mild splenomegaly.     Past Medical History  Diagnosis Date  . Hyperlipidemia   . Hypertension   . Coronary heart disease     a. s/p CABG x 3 (VG->PDA, VG->PL, LIMA->LAD);  b. 2013 Abnl Myoview;  c. 2013 Cath: 3/3 patent grafts, occluded LCX which correlated w/ ischemia on MV->not amenable to PCI->Med Rx.  . Obesity   . CHF (congestive heart failure)   . VT (ventricular tachycardia)   . SVT (supraventricular tachycardia)   . PAD (peripheral  artery disease)     Angiography in February of 2014: Moderate left iliac artery stenosis, mild to moderate diffuse right SFA disease. Severe proximal right popliteal artery stenosis with three-vessel runoff below the knee. Status post self-expanding stent placement to the proximal popliteal artery  . Implantable cardioverter-defibrillator Mdt   . Asthma     "small touch" (07/29/2013)  . History of pneumonia     "3-4 times; last time ~ 2003" (07/29/2013)  . OSA on CPAP   . Type II diabetes mellitus     on insulin  . GERD (gastroesophageal reflux disease)   . H/O hiatal hernia   . Gout   . Anxiety     .  Gastroesophageal cancer-distal esophagus/gastric cardia mass                 07/23/2015  Past Surgical History  Procedure Laterality Date  . Tonsillectomy    . Cardiac catheterization    . Wrist fracture surgery Right   . Cataract extraction w/ intraocular lens implant Left   . Cardiac defibrillator placement  1997; ~ 2003  . Coronary artery bypass graft  1997; 2007    "X3" (07/29/2013)  . Nerve, tendon and artery repair Left 09/20/2013    Procedure: IRRIGATION AND DEBRIDEMENT,Exploration and Repair of Ulnar/Digital  Artery Nerve of Left Index finger;  Surgeon: Roseanne Kaufman, MD;  Location: [facility];  Service: Orthopedics;  Laterality: Left;  . Abdominal aortagram N/A 01/30/2013    Procedure: ABDOMINAL Maxcine Ham;  Surgeon: Wellington Hampshire, MD;  Location: [facility] CATH LAB;  Service: Cardiovascular;  Laterality: N/A;  . Cardiac catheterization  N/A 07/22/2015    Procedure: Left Heart Cath and Coronary Angiography;  Surgeon: Peter M Martinique, MD;  Location: [facility] CV LAB;  Service: Cardiovascular;  Laterality: N/A;  . Esophagogastroduodenoscopy N/A 07/23/2015    Procedure: ESOPHAGOGASTRODUODENOSCOPY (EGD);  Surgeon: Wonda Horner, MD;  Location: [facility] ENDOSCOPY;  Service: Endoscopy;  Laterality: N/A;    Medications: Reviewed  Allergies:  Allergies  Allergen Reactions  . Adhesive [Tape]      Pulls skin off  . Morphine And Related Nausea And Vomiting    Family history: His brother had "cancer ". His mother had metastatic breast cancer. No other family history of cancer.  Social History:   He lives alone in [place]. 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 Rare alcohol use. No risk factor for HIV or hepatitis.  History  Alcohol Use No    Comment: 07/29/2013 "not touched any alcohol since 1997"    History  Smoking status  . Former Smoker -- 2.00 packs/day for 30 years  . Types: Cigarettes  . Quit date: 12/13/1995  Smokeless tobacco  . Never Used      ROS:   Positives include: Anorexia, 12 pound weight loss, solid/liquid dysphagia, dyspnea at rest and with exertion, pain at the mid lower anterior chest and back  A complete ROS was otherwise negative.  Physical Exam:  Blood pressure 157/89, pulse 81, temperature 97.7 F (36.5 C), temperature source Oral, resp. rate 18, height 6' (1.829 m), weight 249 lb 4.8 oz (113.082 kg), SpO2 100 %.  HEENT: Edentulous, oropharynx without visible mass, neck without mass Lungs: Clear bilaterally Cardiac: Regular rate and rhythm Abdomen: No hepatomegaly, no splenomegaly, no apparent ascites, nontender, no mass GU: Testes without mass  Vascular: No leg edema Lymph nodes: No cervical, supraclavicular, axillary, or inguinal nodes Neurologic: Alert and oriented, the motor exam appears intact in the upper and lower extremities Skin: No rash Musculoskeletal: No spine tenderness   LAB:  CBC  Lab Results  Component Value Date   WBC 9.1 07/21/2015   HGB 13.7 07/21/2015   HCT 40.3 07/21/2015   MCV 85.6 07/21/2015   PLT 141* 07/21/2015   NEUTROABS 4.1 09/20/2013     CMP      Component Value Date/Time   NA 136 07/20/2015 0512   K 4.3 07/20/2015 0512   CL 103 07/20/2015 0512   CO2 25 07/20/2015 0512   GLUCOSE 211* 07/20/2015 0512   BUN 11 07/20/2015 0512   CREATININE 1.08 07/20/2015 0512    CALCIUM 8.9 07/20/2015 0512   PROT 7.8 07/20/2015 1222   ALBUMIN 3.9 07/20/2015 1222   AST 27 07/20/2015 1222   ALT 12* 07/20/2015 1222   ALKPHOS 127* 07/20/2015 1222   BILITOT 1.1 07/20/2015 1222   GFRNONAA >60 07/20/2015 0512   GFRAA >60 07/20/2015 0512    Imaging:  CT abdomen/pelvis 07/23/2015 and CT chest 07/20/2015-reviewed   Assessment/Plan:   1. Adenocarcinoma the gastroesophageal junction, status post an endoscopic biopsy of a distal esophagus mass and gastric cardia ulcer on 07/23/2015  Staging CT scans consistent with locally metastatic adenopathy 2. History of coronary artery disease 3. Congestive heart failure 4. History of ventricular tachycardia and supraventricular tachycardia 5. Peripheral arterial disease 6. Implantable defibrillator 7. Diabetes 8. Gout   Disposition:   Walter French has been diagnosed with adenocarcinoma of the gastroesophageal junction. It is not clear whether the tumor started in the distal esophagus or gastric cardia. I explained these tumors are treated in  a similar fashion. There is CT evidence of local metastatic lymphadenopathy. I discussed treatment options with Walter French and his friends. I explained the "standard "approach to treatment in patients with potentially resectable disease. We recommend neoadjuvant chemotherapy/radiation followed by surgical resection. I doubt he French be a surgical candidate based on comorbid conditions.  I recommend treatment with weekly Taxol/carboplatin and concurrent radiation. We reviewed the potential toxicities associated with the Taxol/carboplatin regimen including the chance for nausea/vomiting, mucositis, alopecia, and hematologic toxicity. We discussed the allergic reaction, bone pain, and neuropathy associated with Taxol. We reviewed the potential for an allergic reaction with carboplatin. We also discussed the chance of developing esophagitis with combined modality therapy. He agrees to proceed. Walter French French attend a chemotherapy teaching class.  I discussed the case with Dr. Lisbeth French. Walter French French be referred for a PET scan to rule out distant metastatic disease and to help with radiation planning. If the has locally advanced disease the plan is to proceed with concurrent chemotherapy and radiation. He French be scheduled to begin treatment during the week of 08/10/2015.  We discussed the likelihood of hyperglycemia with the use of Decadron premedication. He French check his blood sugar, avoid concentrated sweets surrounding, chemotherapy, and contact us for a blood sugar greater than 400.  Walter French is scheduled for a first treatment with chemotherapy 08/13/2015. He French return for an office visit 08/20/2015.  Approximately 50 minutes were spent with the patient today. The majority of the time was used for counseling and coordination of care.  [place], [place] 07/30/2015, 2:20 PM

## 2015-07-30 NOTE — Telephone Encounter (Signed)
Appointments made and avs printed for patient,

## 2015-07-31 ENCOUNTER — Encounter: Payer: Self-pay | Admitting: Radiation Oncology

## 2015-07-31 NOTE — Progress Notes (Addendum)
GI Location of Tumor / Histology: GE JUNCTION/STOMACH 35 cm mass  Walter French presented with symptoms of: Chest pain 07/20/15 admitted to  [facility],( progressive dysphasia past 3 months )  Biopsies of (if applicable) revealed:Diagnosis 07/23/15:  1. Stomach, biopsy, Large ulcer in cardia of stomach - INVASIVE ADENOCARCINOMA WITH SIGNET RING CELL FEATURES.  2. Esophagus, biopsy, Ulcerated mass in distal esophagus - INVASIVE ADENOCARCINOMA WITH SIGNET RING CELL FEATURES.  Past/Anticipated interventions by surgeon, if any: Dr. Wonda Horner, MD  Past/Anticipated interventions by medical oncology, if any: Dr. Gar Gibbon seen 07/22/15,in hosp[ital  , Chemo education though 08/03/15, Pet scan scheduled 08/07/15,appt Dr. Benay Spice 08/20/15  Weight changes, if any: loss 13-15 lbs  Bowel/Bladder complaints, if any: no problenms  Nausea / Vomiting, if any:  No but no appetite,  Pain issues, if any:  Yes 4/5 esophagus says irritation,   SAFETY ISSUES: No  Prior radiation? No  Pacemaker/ICD?  Yes Defibrillator Protecta XT  Sees Dr. Caryl Comes, MD Is the patient on methotrexate? 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 Tally Joe,( Cardiac defibrillator placement 1997-2003,) Cardiac bypass x3,07/29/2013    Allergies: Morphine and related =N&V, adhesive tape-pulls skin off

## 2015-08-03 ENCOUNTER — Encounter: Payer: Self-pay | Admitting: Radiation Oncology

## 2015-08-03 ENCOUNTER — Ambulatory Visit
Admission: RE | Admit: 2015-08-03 | Discharge: 2015-08-03 | Disposition: A | Discharge status: Home / Self Care | Payer: Medicare Other | Source: Ambulatory Visit | Attending: Radiation Oncology | Admitting: Radiation Oncology

## 2015-08-03 ENCOUNTER — Encounter: Payer: Medicare Other | Admitting: Nutrition

## 2015-08-03 ENCOUNTER — Other Ambulatory Visit: Payer: Medicare Other

## 2015-08-03 VITALS — BP 138/78 | HR 74 | Temp 98.0°F | Resp 20 | Ht 72.0 in | Wt 248.8 lb

## 2015-08-03 DIAGNOSIS — E669 Obesity, unspecified: Secondary | ICD-10-CM | POA: Diagnosis not present

## 2015-08-03 DIAGNOSIS — Z809 Family history of malignant neoplasm, unspecified: Secondary | ICD-10-CM | POA: Diagnosis not present

## 2015-08-03 DIAGNOSIS — Z803 Family history of malignant neoplasm of breast: Secondary | ICD-10-CM | POA: Diagnosis not present

## 2015-08-03 DIAGNOSIS — Z510 Encounter for antineoplastic radiation therapy: Secondary | ICD-10-CM | POA: Insufficient documentation

## 2015-08-03 DIAGNOSIS — Z87891 Personal history of nicotine dependence: Secondary | ICD-10-CM | POA: Insufficient documentation

## 2015-08-03 DIAGNOSIS — E785 Hyperlipidemia, unspecified: Secondary | ICD-10-CM | POA: Insufficient documentation

## 2015-08-03 DIAGNOSIS — I739 Peripheral vascular disease, unspecified: Secondary | ICD-10-CM | POA: Insufficient documentation

## 2015-08-03 DIAGNOSIS — I2510 Atherosclerotic heart disease of native coronary artery without angina pectoris: Secondary | ICD-10-CM | POA: Insufficient documentation

## 2015-08-03 DIAGNOSIS — C160 Malignant neoplasm of cardia: Secondary | ICD-10-CM | POA: Diagnosis present

## 2015-08-03 DIAGNOSIS — I10 Essential (primary) hypertension: Secondary | ICD-10-CM | POA: Insufficient documentation

## 2015-08-03 DIAGNOSIS — E119 Type 2 diabetes mellitus without complications: Secondary | ICD-10-CM | POA: Diagnosis not present

## 2015-08-03 DIAGNOSIS — F419 Anxiety disorder, unspecified: Secondary | ICD-10-CM | POA: Diagnosis not present

## 2015-08-03 HISTORY — DX: Allergy, unspecified, initial encounter: T78.40XA

## 2015-08-03 HISTORY — DX: Malignant neoplasm of stomach, unspecified: C16.9

## 2015-08-03 HISTORY — DX: Sleep apnea, unspecified: G47.30

## 2015-08-03 NOTE — Progress Notes (Signed)
Please see the Nurse Progress Note in the MD Initial Consult Encounter for this patient. 

## 2015-08-03 NOTE — Progress Notes (Signed)
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  HISTORY OF PRESENT ILLNESS::Walter French is a 69 y.o. 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 The patient was admitted to the hospital on 07/20/15 for chest pain.  A chest CT revealed abnormal wall thickening at the distal esophagus with prominent paraesophageal lymph nodes. A gastrohepatic node measured 1.3 cm.  He underwent a cardiac stress test and cardiac catheterization procedure. The cardiac catheterization showed patent bypass vessels. An echocardiogram revealed an LVEF of 30-35% with grade 2 diastolic dysfunction.  On 07/23/15, patient underwent EGD which revealed an 35 cm esophageal mass that extends to the GE junction and gastric cardia ulcer. 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  He was referred for CTs of the abdomen and pelvis on 07/23/2015. Distal esophageal wall thickening was again noted with paraesophageal adenopathy. No focal liver lesion. Normal adrenal glands. Soft tissue thickening extends to the gastric cardia. A 1.8 cm portacaval node and a 1.3 severe gastrohepatic node were seen. Smaller nodes at the lesser curvature of the stomach. Mild splenomegaly.  Patient notes he has lost 15 lbs. He notes significant pain in his chest area.  The patient is scheduled for a PET scan on 08/07/15.     PREVIOUS RADIATION THERAPY: No   PAST MEDICAL HISTORY:  has a past medical history of Hyperlipidemia; Hypertension; Coronary heart disease; Obesity; CHF (congestive heart  failure); VT (ventricular tachycardia); SVT (supraventricular tachycardia); PAD (peripheral artery disease); Implantable cardioverter-defibrillator Mdt; Asthma; History of pneumonia; OSA on CPAP; GERD (gastroesophageal reflux disease); H/O hiatal hernia; Gout; Anxiety; Allergy; Stomach cancer (07/23/2015); Type II diabetes mellitus; Sleep apnea; Hyperlipidemia; PAD (peripheral artery disease); and CHF (congestive heart failure).     PAST SURGICAL HISTORY: Past Surgical History  Procedure Laterality Date  . Tonsillectomy    . Cardiac catheterization    . Wrist fracture surgery Right   . Cataract extraction w/ intraocular lens implant Left   . Cardiac defibrillator placement  1997; ~ 2003  . Coronary artery bypass graft  1997; 2007    "X3" (07/29/2013)  . Nerve, tendon and artery repair Left 09/20/2013    Procedure: IRRIGATION AND DEBRIDEMENT,Exploration and Repair of Ulnar/Digital  Artery Nerve of Left Index finger;  Surgeon: Roseanne Kaufman, MD;  Location: [facility];  Service: Orthopedics;  Laterality: Left;  . Abdominal aortagram N/A 01/30/2013    Procedure: ABDOMINAL Maxcine Ham;  Surgeon: Wellington Hampshire, MD;  Location: [facility] CATH LAB;  Service: Cardiovascular;  Laterality: N/A;  . Cardiac catheterization N/A 07/22/2015    Procedure: Left Heart Cath and Coronary Angiography;  Surgeon: Peter M Martinique, MD;  Location: [facility] CV LAB;  Service: Cardiovascular;  Laterality: N/A;  . Esophagogastroduodenoscopy N/A 07/23/2015    Procedure: ESOPHAGOGASTRODUODENOSCOPY (EGD);  Surgeon: Wonda Horner, MD;  Location: [facility] ENDOSCOPY;  Service: Endoscopy;  Laterality: N/A;     FAMILY HISTORY: family history includes Breast cancer in his mother; Cancer in his brother; Heart disease in his brother.   SOCIAL HISTORY:  reports that he quit smoking about 19 years ago. His smoking use  included Cigarettes. He has a 60 pack-year smoking history. He has never used smokeless tobacco. He reports that he does not drink  alcohol or use illicit drugs.   ALLERGIES: Adhesive and Morphine and related   MEDICATIONS:  Current Outpatient Prescriptions  Medication Sig Dispense Refill  . allopurinol (ZYLOPRIM) 300 MG tablet Take 300 mg by mouth daily.      [name] aspirin 81 MG tablet Take 1 tablet (81 mg total) by mouth daily. 30 tablet 0  . atorvastatin (LIPITOR) 40 MG tablet Take 1 tablet (40 mg total) by mouth daily. 90 tablet 3  . glipiZIDE (GLUCOTROL) 10 MG tablet Take 10 mg by mouth 2 (two) times daily before a meal.     . insulin detemir (LEVEMIR) 100 UNIT/ML injection Inject 85 Units into the skin 2 (two) times daily.     [name] levothyroxine (SYNTHROID, LEVOTHROID) 75 MCG tablet Take 75 mcg by mouth daily before breakfast.   0  . Liraglutide (VICTOZA) 18 MG/3ML SOPN Inject 1.8 mg into the skin daily.    . metoprolol succinate (TOPROL-XL) 100 MG 24 hr tablet Take 1 tablet (100 mg total) by mouth daily. Take with or immediately following a meal. 30 tablet 6  . metroNIDAZOLE (METROGEL) 0.75 % gel Apply topically 2 (two) times daily. 45 g 0  . NOVOLOG FLEXPEN 100 UNIT/ML FlexPen Inject 20 Units as directed daily.   0  . ranitidine (ZANTAC) 300 MG tablet Take 1 tablet (300 mg total) by mouth daily as needed for heartburn. 30 tablet 6  . traMADol (ULTRAM) 50 MG tablet Take 1 tablet (50 mg total) by mouth every 6 (six) hours as needed for moderate pain. 30 tablet 0  . valsartan (DIOVAN) 160 MG tablet Take 1 tablet (160 mg total) by mouth daily. 30 tablet 11  . nitroGLYCERIN (NITROSTAT) 0.4 MG SL tablet Place 1 tablet (0.4 mg total) under the tongue every 5 (five) minutes x 3 doses as needed for chest pain. (Patient not taking: Reported on 08/03/2015) 25 tablet 3   No current facility-administered medications for this encounter.     REVIEW OF SYSTEMS:  A 15 point review of systems is documented in the electronic medical record. This was obtained by the nursing staff. However, I reviewed this with the patient to discuss  relevant findings and make appropriate changes.  Pertinent items are noted in HPI.    PHYSICAL EXAM:  height is 6' (1.829 m) and weight is 248 lb 12.8 oz (112.855 kg). His oral temperature is 98 F (36.7 C). His blood pressure is 138/78 and his pulse is 74. His respiration is 20 and oxygen saturation is 98%.   General: Well-developed, in no acute distress HEENT: Normocephalic, atraumatic Cardiovascular: Regular rate and rhythm Respiratory: Clear to auscultation bilaterally GI: Soft, nontender, normal bowel sounds Extremities: No edema present   ECOG = 1  0 - Asymptomatic (Fully active, able to carry on all predisease activities without restriction)  1 - Symptomatic but completely ambulatory (Restricted in physically strenuous activity but ambulatory and able to carry out work of a light or sedentary nature. For example, light housework, office work)  2 - Symptomatic, <50% in bed during the day (Ambulatory and capable of all self care but unable to carry out any work activities. Up and about more than 50% of waking hours)  3 - Symptomatic, >50% in bed, but not bedbound (Capable of only limited self-care, confined to bed or chair 50% or more of waking hours)  4 -  Bedbound (Completely disabled. Cannot carry on any self-care. Totally confined to bed or chair)  5 - Death   Eustace Pen MM, Creech RH, Tormey DC, et al. [phone]). "Toxicity and response criteria of the [facility]". [place] Oncol. 5 (6): 649-55  _   LABORATORY DATA:  Lab Results  Component Value Date   WBC 9.1 07/21/2015   HGB 13.7 07/21/2015   HCT 40.3 07/21/2015   MCV 85.6 07/21/2015   PLT 141* 07/21/2015   Lab Results  Component Value Date   NA 136 07/20/2015   K 4.3 07/20/2015   CL 103 07/20/2015   CO2 25 07/20/2015   Lab Results  Component Value Date   ALT 12* 07/20/2015   AST 27 07/20/2015   ALKPHOS 127* 07/20/2015   BILITOT 1.1 07/20/2015      RADIOGRAPHY: Ct Abdomen Pelvis W  Wo Contrast  07/23/2015   CLINICAL DATA:  Esophageal mass diagnosed on endoscopy. Evaluate for metastasis. Diabetes. Hypertension. Hiatal hernia.  EXAM: CT ABDOMEN AND PELVIS WITHOUT AND WITH CONTRAST  TECHNIQUE: Multidetector CT imaging of the abdomen and pelvis was performed following the standard protocol before and following the bolus administration of intravenous contrast.  CONTRAST:  12mL OMNIPAQUE IOHEXOL 300 MG/ML  SOLN  COMPARISON:  Chest CT 07/20/2015. No prior abdominal pelvic imaging.  FINDINGS: Lower chest: Clear lung bases. Mild cardiomegaly with prior median sternotomy. No pericardial or pleural effusion.  9 mm node within the azygoesophageal recess on image 5 is upper normal in size. Small hiatal hernia. Distal esophageal wall thickening, as described previously. Example image 18 of series 7. Adjacent periesophageal adenopathy, occluding a 13 mm node on image 19 which likely demonstrates central necrosis.  Hepatobiliary: Mild hepatic steatosis, without focal liver lesion. Normal gallbladder, without biliary ductal dilatation.  Pancreas: Normal, without mass or ductal dilatation.  Spleen: Mild splenomegaly, 14.5 cm craniocaudal.  Adrenals/Urinary Tract: Normal adrenal glands. Primarily renal vascular calcifications. Cannot exclude punctate collecting system calculi. No hydronephrosis. No hydroureter or ureteric calculi. No bladder calculi. Left renal cysts and bilateral too small to characterize lesions. Left renal sinus cyst. No bladder mass.  Stomach/Bowel: Soft tissue thickening which likely extends to the gastric cardia, including on image 25 of series 7. More distal stomach is unremarkable. Scattered colonic diverticula. Normal terminal ileum and appendix. Normal small bowel.  Vascular/Lymphatic: Advanced aortic and branch vessel atherosclerosis. Mild portacaval adenopathy at 1.8 cm on image 38 of series 7. 1.3 cm gastrohepatic ligament node on image 26 is moderately enlarged. Smaller nodes  also identified along the lesser curvature of the stomach on images 28 and 29. No pelvic adenopathy.  Reproductive: Normal prostate.  Other: No significant free fluid. Fat containing left inguinal hernia is small. No ascites. No evidence of omental or peritoneal disease. Subcutaneous edema about the anterior abdominal wall bilaterally is nonspecific but may relate to injection sites.  Musculoskeletal: No acute osseous abnormality.  IMPRESSION: 1. Distal esophageal primary, with possible extension into the gastric cardia. Adjacent periesophageal and gastrohepatic ligament adenopathy, consistent with nodal metastasis. An enlarged portacaval node could represent a metastasis or be reactive (related to mild steatosis). 2. No evidence of distant metastasis within the abdomen or pelvis. 3. Mild hepatic steatosis. 4. Renal vascular calcifications. Cannot exclude concurrent punctate collecting system calculi. 5. Mild splenomegaly.   Electronically Signed   By: Abigail Miyamoto M.D.   On: 07/23/2015 15:23   Dg Chest 2 View  07/20/2015   CLINICAL DATA:  Acute onset of  shortness of breath and generalized chest pain. Initial encounter.  EXAM: CHEST  2 VIEW  COMPARISON:  Chest radiograph performed 09/20/2013  FINDINGS: The lungs are well-aerated. Mild vascular congestion is noted, with mildly increased interstitial markings, raising question for mild interstitial edema. There is no evidence of pleural effusion or pneumothorax.  The heart is mildly enlarged. The patient is status post median sternotomy. Multiple fractured sternal wires are seen. An AICD is noted at the left chest wall with a single lead ending at the right ventricle. No acute osseous abnormalities are identified.  IMPRESSION: Mild vascular congestion and mild cardiomegaly, with slightly increased interstitial markings, raising concern for mild interstitial edema.   Electronically Signed   By: Garald Balding M.D.   On: 07/20/2015 06:17   Nm Myocar Multi W/spect  W/wall Motion / Ef  07/21/2015    There was no ST segment deviation noted during stress.  Defect 1: There is a large defect of severe severity present in the  basal inferolateral, mid inferolateral and apical lateral location.  Defect 2: There is a small defect of mild severity present in the mid  anterior location.  Findings consistent with ischemia and prior myocardial infarction.  This is a high risk study.  The left ventricular ejection fraction is moderately decreased (30-44%).  Nuclear stress EF: 32%.    Ct Angio Chest Aorta W/cm &/or Wo/cm  07/20/2015   CLINICAL DATA:  Diffuse chest pain  EXAM: CT ANGIOGRAPHY CHEST WITH CONTRAST  TECHNIQUE: Multidetector CT imaging of the chest was performed using the standard protocol during bolus administration of intravenous contrast. Multiplanar CT image reconstructions and MIPs were obtained to evaluate the vascular anatomy.  CONTRAST:  134mL OMNIPAQUE IOHEXOL 350 MG/ML SOLN  COMPARISON:  None.  FINDINGS: Mediastinum: The heart size appears moderately enlarged. There is no pericardial effusion identified. Status post median sternotomy and CABG procedure. Nonunion of sternotomy defect noted. There is abnormal wall thickening involving the distal esophagus. There are prominent lymph nodes surrounding the distal esophagus. Index node on the right measures 1.6 cm, image 80/series 401. Just below the diaphragm there is a gastrohepatic ligament node measuring 1.3 cm, image 92 of series 401. The trachea appears patent and is midline.  The main pulmonary artery appears normal. No lobar or segmental pulmonary artery filling defects identified. There is no lobar or segmental pulmonary artery filling defects to suggest a clinically significant acute pulmonary embolus.  Lungs/Pleura: No pleural effusion identified. No airspace consolidation. Calcified granuloma is identified within the right upper lobe, image 32 of series 402. There is no suspicious pulmonary nodule or mass  identified.  Upper Abdomen: The visualized portions of the liver are unremarkable. The visualized spleen is normal.  Musculoskeletal: Review of the visualized bony structures is negative for aggressive lytic or sclerotic bone lesion.  Review of the MIP images confirms the above findings.  IMPRESSION: 1. No evidence for an acute pulmonary embolus. 2. Abnormal wall thickening involving the distal esophagus. Additionally, there are several adjacent pathologically enlarged lymph nodes. This is a worrisome finding in further investigation with upper endoscopy is advise. 3. 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   Electronically Signed   By: Kerby Moors M.D.   On: 07/20/2015 08:49       IMPRESSION:  The patient is a 69 year old male who has been diagnosed adenocarcinoma of the GE junction. The patient is a good candidate for radiation treatment in conjunction with chemotherapy.  [name] staging  is pending at this time. He has a PET scan coming up on Friday. [name] current information suggests locally advanced disease with regional nodal metastasis.   PLAN: We discussed the possible side effects and risks of treatment in addition to the possible benefits of treatment. We discussed the protocol for radiation treatment.  All of the patient's questions were answered. The patient does wish to proceed with this treatment and has signed a consent form. A simulation will be scheduled such that we can proceed with treatment planning.  The patient is scheduled for a PET scan this Friday.   I anticipate 5.5 to 6.5 weeks of treatment. We will aim to begin the patient's treatment on 08/12/2015.   I spent 45 minutes face to face with the patient and more than 50% of that time was spent in counseling and/or coordination of care.    ________________________________   Jodelle Gross, MD, PhD   **Disclaimer: This note was dictated with voice recognition software. Similar  sounding words can inadvertently be transcribed and this note may contain transcription errors which may not have been corrected upon publication of note.**  This document serves as a record of services personally performed by Kyung Rudd, MD. It was created on his behalf by Derek Mound, a trained medical scribe. The creation of this record is based on the scribe's personal observations and the provider's statements to them. This document has been checked and approved by the attending provider.

## 2015-08-04 ENCOUNTER — Other Ambulatory Visit: Payer: Self-pay | Admitting: *Deleted

## 2015-08-04 NOTE — [facility] Oncology Navigator Note (Signed)
Noted that Walter French was "no show" for his nutrition appointment. POF to scheduler requesting to have him seen by dietician during his chemo on 08/13/15.

## 2015-08-05 ENCOUNTER — Ambulatory Visit
Admission: RE | Admit: 2015-08-05 | Discharge: 2015-08-05 | Disposition: A | Discharge status: Home / Self Care | Payer: Medicare Other | Source: Ambulatory Visit | Attending: Radiation Oncology | Admitting: Radiation Oncology

## 2015-08-05 ENCOUNTER — Encounter: Payer: Self-pay | Admitting: Radiation Oncology

## 2015-08-05 VITALS — BP 151/78 | HR 78 | Temp 97.7°F | Resp 16

## 2015-08-05 DIAGNOSIS — C160 Malignant neoplasm of cardia: Secondary | ICD-10-CM

## 2015-08-05 DIAGNOSIS — Z510 Encounter for antineoplastic radiation therapy: Secondary | ICD-10-CM | POA: Diagnosis not present

## 2015-08-05 NOTE — Progress Notes (Signed)
Vitals stable. Patient presented to the clinic today following simulation requesting "something different for pain." Presently, he reports taking tramadol 50 mg one table every six hour prescribed by Dr. Benay Spice. Reports chest pain 7 on a scale of 0-10. States, "I feel like someone has punched me in the chest." SOB at rest noted. Reports difficulty swallowing. Reports he isn't eating or sleeping well due to "nerves." Reports a productive cough with clear sputum. Denies hemoptysis.   BP 151/78 mmHg  Pulse 78  Temp(Src) 97.7 F (36.5 C) (Oral)  Resp 16  SpO2 100%

## 2015-08-06 ENCOUNTER — Other Ambulatory Visit: Payer: Self-pay | Admitting: *Deleted

## 2015-08-06 MED ORDER — DEXAMETHASONE 4 MG PO TABS: ORAL_TABLET | ORAL | Status: DC

## 2015-08-06 MED ORDER — PROCHLORPERAZINE MALEATE 10 MG PO TABS: 10.0000 mg | ORAL_TABLET | Freq: Four times a day (QID) | ORAL | Status: DC | PRN

## 2015-08-06 NOTE — Telephone Encounter (Signed)
Per Dr. Benay Spice; notified pt that scripts have been sent to pharmacy per request.  Instruction given to take decadron 10 mg night before chemotherapy @ 10pm; Compazine every 6 hours as needed if nauseous.  Pt verbalized understanding and expressed appreciation.

## 2015-08-07 ENCOUNTER — Ambulatory Visit: Payer: Medicare Other | Admitting: Nutrition

## 2015-08-07 ENCOUNTER — Ambulatory Visit (HOSPITAL_COMMUNITY)
Admission: RE | Admit: 2015-08-07 | Discharge: 2015-08-07 | Disposition: A | Discharge status: Home / Self Care | Payer: Medicare Other | Source: Ambulatory Visit | Attending: Oncology | Admitting: Oncology

## 2015-08-07 DIAGNOSIS — C159 Malignant neoplasm of esophagus, unspecified: Secondary | ICD-10-CM

## 2015-08-07 DIAGNOSIS — Z794 Long term (current) use of insulin: Secondary | ICD-10-CM | POA: Diagnosis not present

## 2015-08-07 DIAGNOSIS — E119 Type 2 diabetes mellitus without complications: Secondary | ICD-10-CM | POA: Insufficient documentation

## 2015-08-07 LAB — GLUCOSE, CAPILLARY: Glucose-Capillary: 130 mg/dL — ABNORMAL HIGH (ref 65–99)

## 2015-08-07 MED ORDER — FLUDEOXYGLUCOSE F - 18 (FDG) INJECTION
12.1000 | Freq: Once | INTRAVENOUS | Status: DC | PRN
  Administered 2015-08-07: 12.1 via INTRAVENOUS
  Filled 2015-08-07: qty 12.1

## 2015-08-07 NOTE — Progress Notes (Signed)
69 year old male diagnosed with cancer of the GE junction.  He is a patient of Dr. Isidore Moos.  Past medical history includes hyperlipidemia, hypertension, CAD status post CABG, obesity, CHF, OSA on CPAP, diabetes, GERD, and hiatal hernia.  Medications include Lipitor, Glucotrol, Synthroid, and Victoza.  Labs include hemoglobin A1c 6.8.  Height: 6 feet 0 inches. Weight: 248.8 pounds on 08/05/2015. Usual body weight: 260 pounds. BMI: 33.74.  Patient reports he has a decreased appetite. He eats a good breakfast. Reports he refuses to eat anything that he does not like Plan is for concurrent chemoradiation therapy.  Nutrition diagnosis:  Inadequate oral intake related to new diagnosis of cancer of the GE junction as evidenced by 4% weight loss from usual body weight.    Intervention: Educated patient to consume small frequent meals and snacks to minimize loss of lean body mass. Encouraged patient to consume 3 meals a day with snack in between. Reviewed sources of high protein snacks and provided a fact sheet on increasing calories and protein. Suggested patient should try oral nutrition supplements and provided samples of Carnation breakfast, No sugar added and boost glucose control. Questions were answered and teach back method used.  Contact information was provided.  Monitoring, evaluation, goals: Patient will tolerate adequate calories and protein to minimize weight loss.  Next visit: Thursday, September 8, during chemotherapy.  **Disclaimer: This note was dictated with voice recognition software. Similar sounding words can inadvertently be transcribed and this note may contain transcription errors which may not have been corrected upon publication of note.**

## 2015-08-09 ENCOUNTER — Other Ambulatory Visit: Payer: Self-pay | Admitting: Oncology

## 2015-08-11 ENCOUNTER — Encounter: Payer: Self-pay | Admitting: *Deleted

## 2015-08-11 DIAGNOSIS — Z510 Encounter for antineoplastic radiation therapy: Secondary | ICD-10-CM | POA: Diagnosis not present

## 2015-08-11 NOTE — Progress Notes (Signed)
[facility] Psychosocial Distress Screening Clinical Social Work  Clinical Social Work was referred by distress screening protocol.  The patient scored a 5 on the Psychosocial Distress Thermometer which indicates moderate distress. Clinical Social Worker contacted patient at home to assess for distress and other psychosocial needs.  Patient expressed some concern for understanding his schedule.  CSW and patient discussed his schedule and he plans to request a new one at his appointment tomorrow.  Patient also plans to discuss schedule with nursing staff to make sure he has a clear understanding.  Patient was appreciative of contact.  CSW encouraged patient to call with any questions or concerns.      ONCBCN DISTRESS SCREENING 08/03/2015  Screening Type Initial Screening  Distress experienced in past week (1-10) 5  Practical problem type Work/school;Transportation;Food  Emotional problem type Depression;Nervousness/Anxiety;Adjusting to illness  Information Concerns Type Lack of info about diagnosis;Lack of info about treatment  Physical Problem type Sleep/insomnia;Loss of appetitie  Physician notified of physical symptoms Yes  Referral to clinical social work Yes   Johnnye Lana, MSW, LCSW, OSW-C Clinical Social Worker [facility] [phone]

## 2015-08-12 ENCOUNTER — Ambulatory Visit
Admission: RE | Admit: 2015-08-12 | Discharge: 2015-08-12 | Disposition: A | Discharge status: Home / Self Care | Payer: Medicare Other | Source: Ambulatory Visit | Attending: Radiation Oncology | Admitting: Radiation Oncology

## 2015-08-12 ENCOUNTER — Telehealth: Payer: Self-pay | Admitting: *Deleted

## 2015-08-12 DIAGNOSIS — Z510 Encounter for antineoplastic radiation therapy: Secondary | ICD-10-CM | POA: Diagnosis not present

## 2015-08-12 NOTE — Telephone Encounter (Signed)
Called patient home phone left voice message no new findings from his Pet scan that was ordered  By Dr. Benay Spice, , stage III and if any further questions  Per Dr. Lisbeth Renshaw said to direct to Noralee Stain, her number [phone] 9:44 AM

## 2015-08-13 ENCOUNTER — Ambulatory Visit (HOSPITAL_BASED_OUTPATIENT_CLINIC_OR_DEPARTMENT_OTHER): Payer: Medicare Other

## 2015-08-13 ENCOUNTER — Other Ambulatory Visit (HOSPITAL_BASED_OUTPATIENT_CLINIC_OR_DEPARTMENT_OTHER): Payer: Medicare Other

## 2015-08-13 ENCOUNTER — Ambulatory Visit
Admission: RE | Admit: 2015-08-13 | Discharge: 2015-08-13 | Disposition: A | Discharge status: Home / Self Care | Payer: Medicare Other | Source: Ambulatory Visit | Attending: Radiation Oncology | Admitting: Radiation Oncology

## 2015-08-13 ENCOUNTER — Ambulatory Visit: Payer: Medicare Other

## 2015-08-13 VITALS — BP 143/68 | HR 82 | Temp 98.5°F | Resp 20

## 2015-08-13 DIAGNOSIS — Z510 Encounter for antineoplastic radiation therapy: Secondary | ICD-10-CM | POA: Diagnosis not present

## 2015-08-13 DIAGNOSIS — C155 Malignant neoplasm of lower third of esophagus: Secondary | ICD-10-CM | POA: Diagnosis not present

## 2015-08-13 DIAGNOSIS — E119 Type 2 diabetes mellitus without complications: Secondary | ICD-10-CM

## 2015-08-13 DIAGNOSIS — C160 Malignant neoplasm of cardia: Secondary | ICD-10-CM | POA: Diagnosis not present

## 2015-08-13 DIAGNOSIS — Z5111 Encounter for antineoplastic chemotherapy: Secondary | ICD-10-CM

## 2015-08-13 DIAGNOSIS — C159 Malignant neoplasm of esophagus, unspecified: Secondary | ICD-10-CM

## 2015-08-13 LAB — CBC WITH DIFFERENTIAL/PLATELET
BASO%: 0.1 % (ref 0.0–2.0)
BASOS ABS: 0 10*3/uL (ref 0.0–0.1)
EOS%: 0 % (ref 0.0–7.0)
Eosinophils Absolute: 0 10*3/uL (ref 0.0–0.5)
HEMATOCRIT: 41 % (ref 38.4–49.9)
HGB: 14.1 g/dL (ref 13.0–17.1)
LYMPH#: 0.5 10*3/uL — AB (ref 0.9–3.3)
LYMPH%: 6.5 % — ABNORMAL LOW (ref 14.0–49.0)
MCH: 28.8 pg (ref 27.2–33.4)
MCHC: 34.4 g/dL (ref 32.0–36.0)
MCV: 83.7 fL (ref 79.3–98.0)
MONO#: 0.1 10*3/uL (ref 0.1–0.9)
MONO%: 1.1 % (ref 0.0–14.0)
NEUT#: 7.7 10*3/uL — ABNORMAL HIGH (ref 1.5–6.5)
NEUT%: 92.3 % — AB (ref 39.0–75.0)
PLATELETS: 186 10*3/uL (ref 140–400)
RBC: 4.9 10*6/uL (ref 4.20–5.82)
RDW: 13.5 % (ref 11.0–14.6)
WBC: 8.3 10*3/uL (ref 4.0–10.3)

## 2015-08-13 LAB — COMPREHENSIVE METABOLIC PANEL (CC13)
ALBUMIN: 3.8 g/dL (ref 3.5–5.0)
ALK PHOS: 127 U/L (ref 40–150)
ALT: 7 U/L (ref 0–55)
ANION GAP: 10 meq/L (ref 3–11)
AST: 18 U/L (ref 5–34)
BILIRUBIN TOTAL: 1.16 mg/dL (ref 0.20–1.20)
BUN: 19.7 mg/dL (ref 7.0–26.0)
CALCIUM: 10 mg/dL (ref 8.4–10.4)
CO2: 22 mEq/L (ref 22–29)
CREATININE: 1.2 mg/dL (ref 0.7–1.3)
Chloride: 103 mEq/L (ref 98–109)
EGFR: 60 mL/min/{1.73_m2} — ABNORMAL LOW (ref >90)
Glucose: 325 mg/dl — ABNORMAL HIGH (ref 70–140)
Potassium: 4.9 mEq/L (ref 3.5–5.1)
Sodium: 135 mEq/L — ABNORMAL LOW (ref 136–145)
Total Protein: 7.8 g/dL (ref 6.4–8.3)

## 2015-08-13 LAB — WHOLE BLOOD GLUCOSE
GLUCOSE: 196 mg/dL — AB (ref 70–100)
HRS PC: 0.1 h

## 2015-08-13 MED ORDER — DEXAMETHASONE SODIUM PHOSPHATE 100 MG/10ML IJ SOLN
Freq: Once | INTRAMUSCULAR | Status: AC
  Administered 2015-08-13: 09:00:00 via INTRAVENOUS
  Filled 2015-08-13: qty 8

## 2015-08-13 MED ORDER — DIPHENHYDRAMINE HCL 50 MG/ML IJ SOLN
25.0000 mg | Freq: Once | INTRAMUSCULAR | Status: AC
  Administered 2015-08-13: 25 mg via INTRAVENOUS

## 2015-08-13 MED ORDER — FAMOTIDINE IN NACL 20-0.9 MG/50ML-% IV SOLN
20.0000 mg | Freq: Once | INTRAVENOUS | Status: AC
Start: 2015-08-13 — End: 2015-08-13
  Administered 2015-08-13: 20 mg via INTRAVENOUS

## 2015-08-13 MED ORDER — SODIUM CHLORIDE 0.9 % IV SOLN
240.0000 mg | Freq: Once | INTRAVENOUS | Status: AC
  Administered 2015-08-13: 240 mg via INTRAVENOUS
  Filled 2015-08-13: qty 24

## 2015-08-13 MED ORDER — SODIUM CHLORIDE 0.9 % IV SOLN
50.0000 mg/m2 | Freq: Once | INTRAVENOUS | Status: AC
  Administered 2015-08-13: 120 mg via INTRAVENOUS
  Filled 2015-08-13: qty 20

## 2015-08-13 MED ORDER — INSULIN REGULAR HUMAN 100 UNIT/ML IJ SOLN
20.0000 [IU] | Freq: Once | INTRAMUSCULAR | Status: AC
  Administered 2015-08-13: 20 [IU] via SUBCUTANEOUS
  Filled 2015-08-13: qty 0.2

## 2015-08-13 MED ORDER — SODIUM CHLORIDE 0.9 % IV SOLN
Freq: Once | INTRAVENOUS | Status: AC
  Administered 2015-08-13: 09:00:00 via INTRAVENOUS

## 2015-08-13 MED ORDER — RADIAPLEXRX EX GEL
Freq: Once | CUTANEOUS | Status: AC
  Administered 2015-08-13: 15:00:00 via TOPICAL

## 2015-08-13 NOTE — Patient Instructions (Signed)
[facility] Discharge Instructions for Patients Receiving Chemotherapy  Today you received the following chemotherapy agents:  Taxol and Carboplatin  To help prevent nausea and vomiting after your treatment, we encourage you to take your nausea medication as ordered per MD.   If you develop nausea and vomiting that is not controlled by your nausea medication, call the clinic.   BELOW ARE SYMPTOMS THAT SHOULD BE REPORTED IMMEDIATELY:  *FEVER GREATER THAN 100.5 F  *CHILLS WITH OR WITHOUT FEVER  NAUSEA AND VOMITING THAT IS NOT CONTROLLED WITH YOUR NAUSEA MEDICATION  *UNUSUAL SHORTNESS OF BREATH  *UNUSUAL BRUISING OR BLEEDING  TENDERNESS IN MOUTH AND THROAT WITH OR WITHOUT PRESENCE OF ULCERS  *URINARY PROBLEMS  *BOWEL PROBLEMS  UNUSUAL RASH Items with * indicate a potential emergency and should be followed up as soon as possible.  Feel free to call the clinic you have any questions or concerns. The clinic phone number is [phone].  Please show the CHEMO ALERT CARD at check-in to the Emergency Department and triage nurse.   

## 2015-08-13 NOTE — Progress Notes (Signed)
Radiation therapy and you book, radiaplex gel given to patient with pages marked to read at home and will do post sim teaching tomorrow, he is too tired, has been here all day stated, 2:40 PM

## 2015-08-14 ENCOUNTER — Ambulatory Visit
Admission: RE | Admit: 2015-08-14 | Discharge: 2015-08-14 | Disposition: A | Discharge status: Home / Self Care | Payer: Medicare Other | Source: Ambulatory Visit | Attending: Radiation Oncology | Admitting: Radiation Oncology

## 2015-08-14 ENCOUNTER — Encounter: Payer: Self-pay | Admitting: Radiation Oncology

## 2015-08-14 VITALS — BP 113/72 | HR 77 | Temp 98.2°F | Resp 16 | Ht 72.0 in | Wt 242.3 lb

## 2015-08-14 DIAGNOSIS — Z510 Encounter for antineoplastic radiation therapy: Secondary | ICD-10-CM | POA: Diagnosis not present

## 2015-08-14 DIAGNOSIS — C160 Malignant neoplasm of cardia: Secondary | ICD-10-CM

## 2015-08-14 LAB — CEA: CEA: 17.2 ng/mL — AB (ref 0.0–5.0)

## 2015-08-14 NOTE — Progress Notes (Signed)
Walter French has completed 3 fractions to his esophagus.  He denies having pain in his chest.  He reports he has changed his diet and is eating softer foods now.  He reports having a poor diet and eats a few bites and is full.  He reports drinking 1--2 ensures per day.  He has lost 6 lbs  Since 08/03/15.  Orthostatic vitals taken: bp sitting 113/72, hr 77, bp standing 118/64, hr 86.  He reports trouble sleeping.  He had chemotherapy yesterday.  BP 113/72 mmHg  Pulse 77  Temp(Src) 98.2 F (36.8 C) (Oral)  Resp 16  Ht 6' (1.829 m)  Wt 242 lb 4.8 oz (109.907 kg)  BMI 32.85 kg/m2  SpO2 99%   Wt Readings from Last 3 Encounters:  08/14/15 242 lb 4.8 oz (109.907 kg)  08/03/15 248 lb 12.8 oz (112.855 kg)  07/30/15 249 lb 4.8 oz (113.082 kg)

## 2015-08-14 NOTE — Progress Notes (Addendum)
Patient seen in Lian[facility]1 area with MD  Patient  Stated pain 3/10 scale today ,in bed, after rad tx to spine , total 3/10  completed so far, gave radiation booklet and you therapy to patient to read later, my busines card also,  ,discussed ways to m,anage side effects  , nausea, vomiting,diuiarrhea, imodium prn for diarrhea, , low fiber diet, pain, mild skin irritation if any , patient gave verbal understanding, will call for any questions, MD at bedside  10:36 AM  10:35 AM

## 2015-08-14 NOTE — Progress Notes (Signed)
Department of Radiation Oncology  Phone:  [phone] Fax:        [phone]  Weekly Treatment Note    Name: Walter French Date: 08/17/2015 MRN: [id number] DOB: 08-09-1946   Current dose: 5.4 Gy  Current fraction: 3   MEDICATIONS: Current Outpatient Prescriptions  Medication Sig Dispense Refill  . allopurinol (ZYLOPRIM) 300 MG tablet Take 300 mg by mouth daily.      [name] aspirin 81 MG tablet Take 1 tablet (81 mg total) by mouth daily. 30 tablet 0  . atorvastatin (LIPITOR) 40 MG tablet Take 1 tablet (40 mg total) by mouth daily. 90 tablet 3  . dexamethasone (DECADRON) 4 MG tablet Take 10 mg at 10pm..... night before chemotherapy 10 tablet 0  . glipiZIDE (GLUCOTROL) 10 MG tablet Take 10 mg by mouth 2 (two) times daily before a meal.     . hyaluronate sodium (RADIAPLEXRX) GEL Apply 1 application topically as needed.    [name] HYDROcodone-acetaminophen (NORCO/VICODIN) 5-325 MG per tablet TAKE 1 TABLET BY MOUTH 4 TIMES DAILY AS NEEDED FOR BACK PAIN  0  . insulin detemir (LEVEMIR) 100 UNIT/ML injection Inject 85 Units into the skin 2 (two) times daily.     [name] levothyroxine (SYNTHROID, LEVOTHROID) 75 MCG tablet Take 75 mcg by mouth daily before breakfast.   0  . Liraglutide (VICTOZA) 18 MG/3ML SOPN Inject 1.8 mg into the skin daily.    . metoprolol succinate (TOPROL-XL) 100 MG 24 hr tablet Take 1 tablet (100 mg total) by mouth daily. Take with or immediately following a meal. 30 tablet 6  . metroNIDAZOLE (METROGEL) 0.75 % gel Apply topically 2 (two) times daily. 45 g 0  . NOVOLOG FLEXPEN 100 UNIT/ML FlexPen Inject 20 Units as directed daily.   0  . prochlorperazine (COMPAZINE) 10 MG tablet Take 1 tablet (10 mg total) by mouth every 6 (six) hours as needed for nausea or vomiting. 30 tablet 0  . ranitidine (ZANTAC) 300 MG tablet Take 1 tablet (300 mg total) by mouth daily as needed for heartburn. 30 tablet 6  . traMADol (ULTRAM) 50 MG tablet Take 1 tablet (50 mg total) by mouth every 6 (six)  hours as needed for moderate pain. 30 tablet 0  . valsartan (DIOVAN) 160 MG tablet Take 1 tablet (160 mg total) by mouth daily. 30 tablet 11  . nitroGLYCERIN (NITROSTAT) 0.4 MG SL tablet Place 1 tablet (0.4 mg total) under the tongue every 5 (five) minutes x 3 doses as needed for chest pain. (Patient not taking: Reported on 08/03/2015) 25 tablet 3   No current facility-administered medications for this encounter.     ALLERGIES: Adhesive and Morphine and related   LABORATORY DATA:  Lab Results  Component Value Date   WBC 8.3 08/13/2015   HGB 14.1 08/13/2015   HCT 41.0 08/13/2015   MCV 83.7 08/13/2015   PLT 186 08/13/2015   Lab Results  Component Value Date   NA 135* 08/13/2015   K 4.9 08/13/2015   CL 103 07/20/2015   CO2 22 08/13/2015   Lab Results  Component Value Date   ALT 7 08/13/2015   AST 18 08/13/2015   ALKPHOS 127 08/13/2015   BILITOT 1.16 08/13/2015     NARRATIVE: Walter French was seen today for weekly treatment management. The chart was checked and the patient's films were reviewed. Walter French has completed 3 fractions to his esophagus. He denies having pain in his chest. He reports he has changed his diet and  is eating softer foods now. He reports having a poor diet and eats a few bites and is full. He reports drinking 1--2 Ensures per day. He has lost 6 lbs Since 08/03/15. Orthostatic vitals taken: bp sitting 113/72, hr 77, bp standing 118/64, hr 86. He reports trouble sleeping. He had chemotherapy yesterday.  PHYSICAL EXAMINATION: height is 6' (1.829 m) and weight is 242 lb 4.8 oz (109.907 kg). His oral temperature is 98.2 F (36.8 C). His blood pressure is 113/72 and his pulse is 77. His respiration is 16 and oxygen saturation is 99%.  Alert and oriented x 3. In no distress. He has lost 6 lbs in the last 2 weeks.  ASSESSMENT: The patient is doing satisfactorily with treatment.  PLAN: We will continue with the patient's radiation treatment as planned. I  advised the pt to drink additional Ensures to stabilize his weight.  This document serves as a record of services personally performed by Kyung Rudd, MD. It was created on his behalf by Darcus Austin, a trained medical scribe. The creation of this record is based on the scribe's personal observations and the provider's statements to them. This document has been checked and approved by the attending provider.

## 2015-08-17 ENCOUNTER — Encounter: Payer: Self-pay | Admitting: Radiation Oncology

## 2015-08-17 NOTE — Progress Notes (Addendum)
  Radiation Oncology         [phone] ________________________________  Name: Walter French MRN: [id number]  Date: 08/05/2015  DOB: 1946/07/01  SIMULATION AND TREATMENT PLANNING NOTE  DIAGNOSIS:  Gastroesophageal cancer   NARRATIVE:  The patient was brought to the [facility] [facility] suite.  Identity was confirmed.  All relevant records and images related to the planned course of therapy were reviewed.   Written consent to proceed with treatment was confirmed which was freely given after reviewing the details related to the planned course of therapy had been reviewed with the patient.  Then, the patient was set-up in a stable reproducible  supine position for radiation therapy.  CT images were obtained.  Surface markings were placed.    Medically necessary complex treatment device(s) for immobilization:  Customized vac lock bag.   The CT images were loaded into the planning software.  Then the target and avoidance structures were contoured.  Treatment planning then occurred.  The radiation prescription was entered and confirmed.  The patient will be treated with a 3-D conformal technique on our tomotherapy unit. A total of 10 complex treatment devices were fabricated which relate to the designed radiation treatment fields. Each of these customized fields/ complex treatment devices will be used on a daily basis during the radiation course. I have requested : 3D Simulation  I have requested a DVH of the following structures: Target volume, lungs, liver, spinal cord, heart.    The patient will undergo daily image guidance to ensure accurate localization of the target, and adequate minimize dose to the normal surrounding structures in close proximity to the target.   PLAN:  The patient will initially receive 45 Gy in 25 fractions. It is anticipated that the patient will then receive a 9 gray boost for a total final dose of 54 gray.  ________________________________   Jodelle Gross, MD,  PhD

## 2015-08-18 ENCOUNTER — Ambulatory Visit
Admission: RE | Admit: 2015-08-18 | Discharge: 2015-08-18 | Disposition: A | Discharge status: Home / Self Care | Payer: Medicare Other | Source: Ambulatory Visit | Attending: Radiation Oncology | Admitting: Radiation Oncology

## 2015-08-18 DIAGNOSIS — Z510 Encounter for antineoplastic radiation therapy: Secondary | ICD-10-CM | POA: Diagnosis not present

## 2015-08-19 ENCOUNTER — Ambulatory Visit
Admission: RE | Admit: 2015-08-19 | Discharge: 2015-08-19 | Disposition: A | Discharge status: Home / Self Care | Payer: Medicare Other | Source: Ambulatory Visit | Attending: Radiation Oncology | Admitting: Radiation Oncology

## 2015-08-19 DIAGNOSIS — Z510 Encounter for antineoplastic radiation therapy: Secondary | ICD-10-CM | POA: Diagnosis not present

## 2015-08-20 ENCOUNTER — Encounter: Payer: Self-pay | Admitting: *Deleted

## 2015-08-20 ENCOUNTER — Ambulatory Visit
Admission: RE | Admit: 2015-08-20 | Discharge: 2015-08-20 | Disposition: A | Discharge status: Home / Self Care | Payer: Medicare Other | Source: Ambulatory Visit | Attending: Radiation Oncology | Admitting: Radiation Oncology

## 2015-08-20 ENCOUNTER — Other Ambulatory Visit (HOSPITAL_BASED_OUTPATIENT_CLINIC_OR_DEPARTMENT_OTHER): Payer: Medicare Other

## 2015-08-20 ENCOUNTER — Ambulatory Visit (HOSPITAL_BASED_OUTPATIENT_CLINIC_OR_DEPARTMENT_OTHER): Payer: Medicare Other

## 2015-08-20 ENCOUNTER — Ambulatory Visit: Payer: Medicare Other | Admitting: Nutrition

## 2015-08-20 ENCOUNTER — Ambulatory Visit (HOSPITAL_BASED_OUTPATIENT_CLINIC_OR_DEPARTMENT_OTHER): Payer: Medicare Other | Admitting: Oncology

## 2015-08-20 VITALS — BP 136/74 | HR 81 | Temp 98.1°F | Resp 18 | Ht 72.0 in | Wt 243.8 lb

## 2015-08-20 DIAGNOSIS — C159 Malignant neoplasm of esophagus, unspecified: Secondary | ICD-10-CM

## 2015-08-20 DIAGNOSIS — C160 Malignant neoplasm of cardia: Secondary | ICD-10-CM | POA: Diagnosis present

## 2015-08-20 DIAGNOSIS — Z5111 Encounter for antineoplastic chemotherapy: Secondary | ICD-10-CM | POA: Diagnosis not present

## 2015-08-20 DIAGNOSIS — Z510 Encounter for antineoplastic radiation therapy: Secondary | ICD-10-CM | POA: Diagnosis not present

## 2015-08-20 LAB — CBC WITH DIFFERENTIAL/PLATELET
BASO%: 0.1 % (ref 0.0–2.0)
BASOS ABS: 0 10*3/uL (ref 0.0–0.1)
EOS ABS: 0 10*3/uL (ref 0.0–0.5)
EOS%: 0.1 % (ref 0.0–7.0)
HCT: 40 % (ref 38.4–49.9)
HGB: 13.7 g/dL (ref 13.0–17.1)
LYMPH%: 2.8 % — AB (ref 14.0–49.0)
MCH: 28.7 pg (ref 27.2–33.4)
MCHC: 34.3 g/dL (ref 32.0–36.0)
MCV: 83.9 fL (ref 79.3–98.0)
MONO#: 0.2 10*3/uL (ref 0.1–0.9)
MONO%: 2.3 % (ref 0.0–14.0)
NEUT#: 7 10*3/uL — ABNORMAL HIGH (ref 1.5–6.5)
NEUT%: 94.7 % — AB (ref 39.0–75.0)
Platelets: 149 10*3/uL (ref 140–400)
RBC: 4.77 10*6/uL (ref 4.20–5.82)
RDW: 13.5 % (ref 11.0–14.6)
WBC: 7.4 10*3/uL (ref 4.0–10.3)
lymph#: 0.2 10*3/uL — ABNORMAL LOW (ref 0.9–3.3)

## 2015-08-20 MED ORDER — FAMOTIDINE IN NACL 20-0.9 MG/50ML-% IV SOLN
INTRAVENOUS | Status: AC
  Filled 2015-08-20: qty 50

## 2015-08-20 MED ORDER — SODIUM CHLORIDE 0.9 % IV SOLN
Freq: Once | INTRAVENOUS | Status: AC
  Administered 2015-08-20: 14:00:00 via INTRAVENOUS
  Filled 2015-08-20: qty 8

## 2015-08-20 MED ORDER — SODIUM CHLORIDE 0.9 % IV SOLN
240.0000 mg | Freq: Once | INTRAVENOUS | Status: AC
  Administered 2015-08-20: 240 mg via INTRAVENOUS
  Filled 2015-08-20: qty 24

## 2015-08-20 MED ORDER — HYDROCODONE-ACETAMINOPHEN 5-325 MG PO TABS: ORAL_TABLET | ORAL | Status: DC

## 2015-08-20 MED ORDER — FAMOTIDINE IN NACL 20-0.9 MG/50ML-% IV SOLN
20.0000 mg | Freq: Once | INTRAVENOUS | Status: AC
  Administered 2015-08-20: 20 mg via INTRAVENOUS

## 2015-08-20 MED ORDER — SODIUM CHLORIDE 0.9 % IV SOLN
Freq: Once | INTRAVENOUS | Status: AC
  Administered 2015-08-20: 14:00:00 via INTRAVENOUS

## 2015-08-20 MED ORDER — DIPHENHYDRAMINE HCL 50 MG/ML IJ SOLN
25.0000 mg | Freq: Once | INTRAMUSCULAR | Status: AC
  Administered 2015-08-20: 25 mg via INTRAVENOUS

## 2015-08-20 MED ORDER — DIPHENHYDRAMINE HCL 50 MG/ML IJ SOLN
INTRAMUSCULAR | Status: AC
  Filled 2015-08-20: qty 1

## 2015-08-20 MED ORDER — SODIUM CHLORIDE 0.9 % IV SOLN
50.0000 mg/m2 | Freq: Once | INTRAVENOUS | Status: AC
  Administered 2015-08-20: 120 mg via INTRAVENOUS
  Filled 2015-08-20: qty 20

## 2015-08-20 NOTE — Progress Notes (Signed)
Nutrition follow-up completed with patient diagnosed with cancer of the GE junction. Patient reports decreased oral intake. Weight has decreased and was documented as 243.8 pounds September 8 decreased from 248.8 pounds August 24. He continues to have poor appetite and generally makes himself eat at mealtimes. Patient has enjoyed oral nutrition supplements.  Nutrition diagnosis: Inadequate oral intake continues.  Intervention: Educated patient to increase calories and protein at meals and snacks. Recommended patient continue oral nutrition supplements twice a day to 3 times a day. Provided additional coupons. Questions were answered and teach back method was used.  Monitoring, evaluation, goals: Patient will work to increase calories and protein to minimize further weight loss.  Next visit: Thursday, September 22, during infusion.  **Disclaimer: This note was dictated with voice recognition software. Similar sounding words can inadvertently be transcribed and this note may contain transcription errors which may not have been corrected upon publication of note.**

## 2015-08-20 NOTE — Progress Notes (Signed)
  [facility] OFFICE PROGRESS NOTE   Diagnosis: GE junction carcinoma  INTERVAL HISTORY:   Walter French returns as scheduled. He completed a first cycle of Taxol/carboplatin 08/13/2015. He tolerated chemotherapy well. No nausea/vomiting or symptoms of an allergic reaction. He continues to have subxiphoid discomfort, relieved with hydrocodone. The blood sugar has been elevated. He monitors the blood sugar at home. He has noted an increase in dyspnea since beginning treatment.  Objective:  Vital signs in last 24 hours:  Blood pressure 136/74, pulse 81, temperature 98.1 F (36.7 C), temperature source Oral, resp. rate 18, height 6' (1.829 m), weight 243 lb 12.8 oz (110.587 kg), SpO2 98 %.    HEENT: No thrush or ulcers Resp: Clear bilaterally Cardio: Regular rate and rhythm GI: No hepatomegaly, nontender Vascular: No leg edema   Lab Results:  Lab Results  Component Value Date   WBC 7.4 08/20/2015   HGB 13.7 08/20/2015   HCT 40.0 08/20/2015   MCV 83.9 08/20/2015   PLT 149 08/20/2015   NEUTROABS 7.0* 08/20/2015     Lab Results  Component Value Date   CEA 17.2* 08/13/2015    Imaging:  No results found.  Medications: I have reviewed the patient's current medications.  Assessment/Plan: 1. Adenocarcinoma the gastroesophageal junction, status post an endoscopic biopsy of a distal esophagus mass and gastric cardia ulcer on 07/23/2015  Staging CT scans consistent with locally metastatic adenopathy  PET scan 08/07/2015 with hypermetabolic mass at the distal esophagus/GE junction and a hypermetabolic paraesophageal, gastrohepatic, and portacaval lymph nodes  Initiation of radiation 08/12/2015, cycle 1 Taxol/carboplatin 08/13/2015 2. History of coronary artery disease 3. Congestive heart failure 4. History of ventricular tachycardia and supraventricular tachycardia 5. Peripheral arterial disease 6. Implantable  defibrillator 7. Diabetes 8. Gout   Disposition:  Walter French appears to be tolerating the treatment well to date. We refilled his prescription for hydrocodone. We will decrease the Decadron dose in the chemotherapy premedication as an attempt to help the hyperglycemia. He will continue monitoring the blood sugar at home.  Walter French will return for chemotherapy in one week and an office visit in 2 weeks.  Betsy Coder, MD  08/20/2015  9:39 AM

## 2015-08-20 NOTE — Progress Notes (Signed)
Oncology Nurse Navigator Documentation  Oncology Nurse Navigator Flowsheets 08/20/2015  Referral date to RadOnc/MedOnc -  Navigator Encounter Type Treatment  Patient Visit Type Medonc  Treatment Phase Treatment #2 Taxol/Carbo  Barriers/Navigation Needs Family concerns  Education Pain/ Symptom Management  Interventions Education Method  Referrals -  Education Method Verbal--take pain med at night since chest pain awakens him often  Support Groups/Services -  Time Spent with Patient 15  Instructed to call for glucose > 400. Reminded him to chop his meats up well and add gravy to ease swallowing.

## 2015-08-20 NOTE — Patient Instructions (Signed)
[facility] Discharge Instructions for Patients Receiving Chemotherapy  Today you received the following chemotherapy agents Paclitaxel.   To help prevent nausea and vomiting after your treatment, we encourage you to take your nausea medication as directed.    If you develop nausea and vomiting that is not controlled by your nausea medication, call the clinic.   BELOW ARE SYMPTOMS THAT SHOULD BE REPORTED IMMEDIATELY:  *FEVER GREATER THAN 100.5 F  *CHILLS WITH OR WITHOUT FEVER  NAUSEA AND VOMITING THAT IS NOT CONTROLLED WITH YOUR NAUSEA MEDICATION  *UNUSUAL SHORTNESS OF BREATH  *UNUSUAL BRUISING OR BLEEDING  TENDERNESS IN MOUTH AND THROAT WITH OR WITHOUT PRESENCE OF ULCERS  *URINARY PROBLEMS  *BOWEL PROBLEMS  UNUSUAL RASH Items with * indicate a potential emergency and should be followed up as soon as possible.  Feel free to call the clinic you have any questions or concerns. The clinic phone number is [phone].  Please show the CHEMO ALERT CARD at check-in to the Emergency Department and triage nurse.   

## 2015-08-21 ENCOUNTER — Ambulatory Visit
Admission: RE | Admit: 2015-08-21 | Discharge: 2015-08-21 | Disposition: A | Discharge status: Home / Self Care | Payer: Medicare Other | Source: Ambulatory Visit | Attending: Radiation Oncology | Admitting: Radiation Oncology

## 2015-08-21 ENCOUNTER — Encounter: Payer: Self-pay | Admitting: Radiation Oncology

## 2015-08-21 VITALS — BP 120/66 | HR 70 | Temp 98.3°F | Resp 12 | Ht 72.0 in | Wt 244.8 lb

## 2015-08-21 DIAGNOSIS — Z510 Encounter for antineoplastic radiation therapy: Secondary | ICD-10-CM | POA: Diagnosis not present

## 2015-08-21 DIAGNOSIS — C160 Malignant neoplasm of cardia: Secondary | ICD-10-CM

## 2015-08-21 MED ORDER — SUCRALFATE 1 G PO TABS: 1.0000 g | ORAL_TABLET | Freq: Four times a day (QID) | ORAL | Status: DC

## 2015-08-21 NOTE — Progress Notes (Signed)
Walter French has completed 7 fractions to his esophagus.  He reports having constant pain in the center of his chest.  He reports having stabbing pain in his central chest that started at 2 am.  He says this happens every morning since he started chemo and he takes a pain pill and it goes away.   He reports having a poor appetite.  He denies trouble swallowing.  He does not have any skin irritation.  He reports fatigue and trouble sleeping.  He had chemotherapy yesterday.  BP 120/66 mmHg  Pulse 70  Temp(Src) 98.3 F (36.8 C) (Oral)  Resp 12  Ht 6' (1.829 m)  Wt 244 lb 12.8 oz (111.041 kg)  BMI 33.19 kg/m2  SpO2 98%   Wt Readings from Last 3 Encounters:  08/21/15 244 lb 12.8 oz (111.041 kg)  08/20/15 243 lb 12.8 oz (110.587 kg)  08/14/15 242 lb 4.8 oz (109.907 kg)

## 2015-08-21 NOTE — Progress Notes (Signed)
Department of Radiation Oncology  Phone:  [phone] Fax:        [phone]  Weekly Treatment Note    Name: Walter French Date: 08/21/2015 MRN: [id number] DOB: 1946/08/02   Current dose: 12.6 Gy  Current fraction:7   MEDICATIONS: Current Outpatient Prescriptions  Medication Sig Dispense Refill  . allopurinol (ZYLOPRIM) 300 MG tablet Take 300 mg by mouth daily.      [name] aspirin 81 MG tablet Take 1 tablet (81 mg total) by mouth daily. 30 tablet 0  . atorvastatin (LIPITOR) 40 MG tablet Take 1 tablet (40 mg total) by mouth daily. 90 tablet 3  . dexamethasone (DECADRON) 4 MG tablet Take 10 mg at 10pm..... night before chemotherapy 10 tablet 0  . glipiZIDE (GLUCOTROL) 10 MG tablet Take 10 mg by mouth 2 (two) times daily before a meal.     . hyaluronate sodium (RADIAPLEXRX) GEL Apply 1 application topically as needed.    [name] HYDROcodone-acetaminophen (NORCO/VICODIN) 5-325 MG per tablet TAKE 1 TABLET BY MOUTH 4 TIMES DAILY AS NEEDED FOR PAIN 30 tablet 0  . insulin detemir (LEVEMIR) 100 UNIT/ML injection Inject 85 Units into the skin 2 (two) times daily.     [name] levothyroxine (SYNTHROID, LEVOTHROID) 75 MCG tablet Take 75 mcg by mouth daily before breakfast.   0  . Liraglutide (VICTOZA) 18 MG/3ML SOPN Inject 1.8 mg into the skin daily.    . metoprolol succinate (TOPROL-XL) 100 MG 24 hr tablet Take 1 tablet (100 mg total) by mouth daily. Take with or immediately following a meal. 30 tablet 6  . metroNIDAZOLE (METROGEL) 0.75 % gel Apply topically 2 (two) times daily. 45 g 0  . NOVOLOG FLEXPEN 100 UNIT/ML FlexPen Inject 20 Units as directed daily.   0  . prochlorperazine (COMPAZINE) 10 MG tablet Take 1 tablet (10 mg total) by mouth every 6 (six) hours as needed for nausea or vomiting. 30 tablet 0  . ranitidine (ZANTAC) 300 MG tablet Take 1 tablet (300 mg total) by mouth daily as needed for heartburn. 30 tablet 6  . traMADol (ULTRAM) 50 MG tablet Take 1 tablet (50 mg total) by mouth every 6  (six) hours as needed for moderate pain. 30 tablet 0  . valsartan (DIOVAN) 160 MG tablet Take 1 tablet (160 mg total) by mouth daily. 30 tablet 11  . HYDROcodone-acetaminophen (NORCO/VICODIN) 5-325 MG per tablet TAKE 1 TABLET BY MOUTH 4 TIMES DAILY AS NEEDED FOR BACK PAIN 30 tablet 0  . nitroGLYCERIN (NITROSTAT) 0.4 MG SL tablet Place 1 tablet (0.4 mg total) under the tongue every 5 (five) minutes x 3 doses as needed for chest pain. (Patient not taking: Reported on 08/03/2015) 25 tablet 3  . sucralfate (CARAFATE) 1 G tablet Take 1 tablet (1 g total) by mouth 4 (four) times daily. 120 tablet 2   No current facility-administered medications for this encounter.     ALLERGIES: Adhesive and Morphine and related   LABORATORY DATA:  Lab Results  Component Value Date   WBC 7.4 08/20/2015   HGB 13.7 08/20/2015   HCT 40.0 08/20/2015   MCV 83.9 08/20/2015   PLT 149 08/20/2015   Lab Results  Component Value Date   NA 135* 08/13/2015   K 4.9 08/13/2015   CL 103 07/20/2015   CO2 22 08/13/2015   Lab Results  Component Value Date   ALT 7 08/13/2015   AST 18 08/13/2015   ALKPHOS 127 08/13/2015   BILITOT 1.16 08/13/2015  NARRATIVE: Walter French was seen today for weekly treatment management. The chart was checked and the patient's films were reviewed.  Walter French has completed 7 fractions to his esophagus.  He reports having constant pain in the center of his chest.  He reports having stabbing pain in his central chest that started at 2 am.  He says this happens every morning since he started chemo and he takes a pain pill and it goes away.   He reports having a poor appetite.  He denies trouble swallowing.  He does not have any skin irritation.  He reports fatigue and trouble sleeping.  He had chemotherapy yesterday.  BP 120/66 mmHg  Pulse 70  Temp(Src) 98.3 F (36.8 C) (Oral)  Resp 12  Ht 6' (1.829 m)  Wt 244 lb 12.8 oz (111.041 kg)  BMI 33.19 kg/m2  SpO2 98%   Wt Readings  from Last 3 Encounters:  08/21/15 244 lb 12.8 oz (111.041 kg)  08/20/15 243 lb 12.8 oz (110.587 kg)  08/14/15 242 lb 4.8 oz (109.907 kg)    PHYSICAL EXAMINATION: height is 6' (1.829 m) and weight is 244 lb 12.8 oz (111.041 kg). His oral temperature is 98.3 F (36.8 C). His blood pressure is 120/66 and his pulse is 70. His respiration is 12 and oxygen saturation is 98%.        ASSESSMENT: The patient is doing satisfactorily with treatment.  PLAN: We will continue with the patient's radiation treatment as planned. I have called in a prescription for Carafate so the patient has this available. I do not believe that he needs necessarily to begin this now but we discussed this medication.

## 2015-08-24 ENCOUNTER — Ambulatory Visit
Admission: RE | Admit: 2015-08-24 | Discharge: 2015-08-24 | Disposition: A | Discharge status: Home / Self Care | Payer: Medicare Other | Source: Ambulatory Visit | Attending: Radiation Oncology | Admitting: Radiation Oncology

## 2015-08-24 DIAGNOSIS — Z510 Encounter for antineoplastic radiation therapy: Secondary | ICD-10-CM | POA: Diagnosis not present

## 2015-08-25 ENCOUNTER — Ambulatory Visit: Payer: Medicare Other | Admitting: Radiation Oncology

## 2015-08-25 ENCOUNTER — Ambulatory Visit
Admission: RE | Admit: 2015-08-25 | Discharge: 2015-08-25 | Disposition: A | Discharge status: Home / Self Care | Payer: Medicare Other | Source: Ambulatory Visit | Attending: Radiation Oncology | Admitting: Radiation Oncology

## 2015-08-25 DIAGNOSIS — Z510 Encounter for antineoplastic radiation therapy: Secondary | ICD-10-CM | POA: Diagnosis not present

## 2015-08-26 ENCOUNTER — Ambulatory Visit
Admission: RE | Admit: 2015-08-26 | Discharge: 2015-08-26 | Disposition: A | Discharge status: Home / Self Care | Payer: Medicare Other | Source: Ambulatory Visit | Attending: Radiation Oncology | Admitting: Radiation Oncology

## 2015-08-26 DIAGNOSIS — Z510 Encounter for antineoplastic radiation therapy: Secondary | ICD-10-CM | POA: Diagnosis not present

## 2015-08-27 ENCOUNTER — Other Ambulatory Visit (HOSPITAL_BASED_OUTPATIENT_CLINIC_OR_DEPARTMENT_OTHER): Payer: Medicare Other

## 2015-08-27 ENCOUNTER — Ambulatory Visit
Admission: RE | Admit: 2015-08-27 | Discharge: 2015-08-27 | Disposition: A | Discharge status: Home / Self Care | Payer: Medicare Other | Source: Ambulatory Visit | Attending: Radiation Oncology | Admitting: Radiation Oncology

## 2015-08-27 ENCOUNTER — Ambulatory Visit (HOSPITAL_BASED_OUTPATIENT_CLINIC_OR_DEPARTMENT_OTHER): Payer: Medicare Other

## 2015-08-27 VITALS — BP 140/70 | HR 92 | Temp 97.0°F | Resp 16

## 2015-08-27 DIAGNOSIS — Z5111 Encounter for antineoplastic chemotherapy: Secondary | ICD-10-CM

## 2015-08-27 DIAGNOSIS — C155 Malignant neoplasm of lower third of esophagus: Secondary | ICD-10-CM | POA: Diagnosis not present

## 2015-08-27 DIAGNOSIS — C160 Malignant neoplasm of cardia: Secondary | ICD-10-CM

## 2015-08-27 DIAGNOSIS — Z510 Encounter for antineoplastic radiation therapy: Secondary | ICD-10-CM | POA: Diagnosis not present

## 2015-08-27 LAB — COMPREHENSIVE METABOLIC PANEL (CC13)
ALBUMIN: 3.7 g/dL (ref 3.5–5.0)
ALK PHOS: 102 U/L (ref 40–150)
AST: 16 U/L (ref 5–34)
Anion Gap: 7 mEq/L (ref 3–11)
BILIRUBIN TOTAL: 1.4 mg/dL — AB (ref 0.20–1.20)
BUN: 12.4 mg/dL (ref 7.0–26.0)
CALCIUM: 10 mg/dL (ref 8.4–10.4)
CO2: 25 mEq/L (ref 22–29)
CREATININE: 1 mg/dL (ref 0.7–1.3)
Chloride: 106 mEq/L (ref 98–109)
EGFR: 83 mL/min/{1.73_m2} — ABNORMAL LOW (ref >90)
GLUCOSE: 243 mg/dL — AB (ref 70–140)
Potassium: 4.1 mEq/L (ref 3.5–5.1)
SODIUM: 139 meq/L (ref 136–145)
TOTAL PROTEIN: 7.3 g/dL (ref 6.4–8.3)

## 2015-08-27 LAB — CBC WITH DIFFERENTIAL/PLATELET
BASO%: 0.2 % (ref 0.0–2.0)
Basophils Absolute: 0 10*3/uL (ref 0.0–0.1)
EOS%: 0.2 % (ref 0.0–7.0)
Eosinophils Absolute: 0 10*3/uL (ref 0.0–0.5)
HCT: 42 % (ref 38.4–49.9)
HGB: 14.4 g/dL (ref 13.0–17.1)
LYMPH%: 2.7 % — AB (ref 14.0–49.0)
MCH: 28.6 pg (ref 27.2–33.4)
MCHC: 34.3 g/dL (ref 32.0–36.0)
MCV: 83.3 fL (ref 79.3–98.0)
MONO#: 0.1 10*3/uL (ref 0.1–0.9)
MONO%: 2.2 % (ref 0.0–14.0)
NEUT%: 94.7 % — AB (ref 39.0–75.0)
NEUTROS ABS: 3.8 10*3/uL (ref 1.5–6.5)
PLATELETS: 108 10*3/uL — AB (ref 140–400)
RBC: 5.04 10*6/uL (ref 4.20–5.82)
RDW: 14 % (ref 11.0–14.6)
WBC: 4.1 10*3/uL (ref 4.0–10.3)
lymph#: 0.1 10*3/uL — ABNORMAL LOW (ref 0.9–3.3)

## 2015-08-27 MED ORDER — SODIUM CHLORIDE 0.9 % IV SOLN
Freq: Once | INTRAVENOUS | Status: AC
  Administered 2015-08-27: 11:00:00 via INTRAVENOUS
  Filled 2015-08-27: qty 8

## 2015-08-27 MED ORDER — FAMOTIDINE IN NACL 20-0.9 MG/50ML-% IV SOLN
20.0000 mg | Freq: Once | INTRAVENOUS | Status: AC
  Administered 2015-08-27: 20 mg via INTRAVENOUS

## 2015-08-27 MED ORDER — CARBOPLATIN CHEMO INJECTION 450 MG/45ML
240.0000 mg | Freq: Once | INTRAVENOUS | Status: AC
  Administered 2015-08-27: 240 mg via INTRAVENOUS
  Filled 2015-08-27: qty 24

## 2015-08-27 MED ORDER — SODIUM CHLORIDE 0.9 % IV SOLN
Freq: Once | INTRAVENOUS | Status: AC
  Administered 2015-08-27: 11:00:00 via INTRAVENOUS

## 2015-08-27 MED ORDER — DIPHENHYDRAMINE HCL 50 MG/ML IJ SOLN
INTRAMUSCULAR | Status: AC
  Filled 2015-08-27: qty 1

## 2015-08-27 MED ORDER — SODIUM CHLORIDE 0.9 % IV SOLN
50.0000 mg/m2 | Freq: Once | INTRAVENOUS | Status: AC
  Administered 2015-08-27: 120 mg via INTRAVENOUS
  Filled 2015-08-27: qty 20

## 2015-08-27 MED ORDER — FAMOTIDINE IN NACL 20-0.9 MG/50ML-% IV SOLN
INTRAVENOUS | Status: AC
  Filled 2015-08-27: qty 50

## 2015-08-27 MED ORDER — DIPHENHYDRAMINE HCL 50 MG/ML IJ SOLN
25.0000 mg | Freq: Once | INTRAMUSCULAR | Status: AC
  Administered 2015-08-27: 25 mg via INTRAVENOUS

## 2015-08-27 NOTE — Patient Instructions (Signed)
[facility] Discharge Instructions for Patients Receiving Chemotherapy  Today you received the following chemotherapy agents: Taxol and Carboplatin.  To help prevent nausea and vomiting after your treatment, we encourage you to take your nausea medication: Compazine. Take one every 6 hours as needed. If you develop nausea and vomiting that is not controlled by your nausea medication, call the clinic.   BELOW ARE SYMPTOMS THAT SHOULD BE REPORTED IMMEDIATELY:  *FEVER GREATER THAN 100.5 F  *CHILLS WITH OR WITHOUT FEVER  NAUSEA AND VOMITING THAT IS NOT CONTROLLED WITH YOUR NAUSEA MEDICATION  *UNUSUAL SHORTNESS OF BREATH  *UNUSUAL BRUISING OR BLEEDING  TENDERNESS IN MOUTH AND THROAT WITH OR WITHOUT PRESENCE OF ULCERS  *URINARY PROBLEMS  *BOWEL PROBLEMS  UNUSUAL RASH Items with * indicate a potential emergency and should be followed up as soon as possible.  Feel free to call the clinic should you have any questions or concerns. The clinic phone number is [phone].  Please show the CHEMO ALERT CARD at check-in to the Emergency Department and triage nurse.   

## 2015-08-27 NOTE — Progress Notes (Signed)
Glucose elevated. Pt reports he took Decadron at 10PM. Instructed him to discontinue Decadron. Reviewed elevated bilirubin reviewed with pharmacy. Per Theadora Rama, PharmD: no dose reductions required.

## 2015-08-28 ENCOUNTER — Ambulatory Visit
Admission: RE | Admit: 2015-08-28 | Discharge: 2015-08-28 | Disposition: A | Discharge status: Home / Self Care | Payer: Medicare Other | Source: Ambulatory Visit | Attending: Radiation Oncology | Admitting: Radiation Oncology

## 2015-08-28 DIAGNOSIS — Z510 Encounter for antineoplastic radiation therapy: Secondary | ICD-10-CM | POA: Diagnosis not present

## 2015-08-30 ENCOUNTER — Other Ambulatory Visit: Payer: Self-pay | Admitting: Oncology

## 2015-08-31 ENCOUNTER — Encounter: Payer: Self-pay | Admitting: Radiation Oncology

## 2015-08-31 ENCOUNTER — Ambulatory Visit
Admission: RE | Admit: 2015-08-31 | Discharge: 2015-08-31 | Disposition: A | Discharge status: Home / Self Care | Payer: Medicare Other | Source: Ambulatory Visit | Attending: Radiation Oncology | Admitting: Radiation Oncology

## 2015-08-31 VITALS — BP 130/95 | HR 116 | Resp 18

## 2015-08-31 DIAGNOSIS — C160 Malignant neoplasm of cardia: Secondary | ICD-10-CM

## 2015-08-31 DIAGNOSIS — Z510 Encounter for antineoplastic radiation therapy: Secondary | ICD-10-CM | POA: Diagnosis not present

## 2015-08-31 NOTE — Progress Notes (Signed)
Patient scheduled to follow up with Ernestene Kiel on 09/03/2015 about nausea and vomiting associated with supplemental feedings.

## 2015-08-31 NOTE — Progress Notes (Signed)
Department of Radiation Oncology  Phone:  [phone] Fax:        [phone]  Weekly Treatment Note    Name: Walter French Date: 08/31/2015 MRN: [id number] DOB: 1946-10-09   Current dose: 23.4 Gy  Current fraction: 13   MEDICATIONS: Current Outpatient Prescriptions  Medication Sig Dispense Refill  . allopurinol (ZYLOPRIM) 300 MG tablet Take 300 mg by mouth daily.      [name] aspirin 81 MG tablet Take 1 tablet (81 mg total) by mouth daily. 30 tablet 0  . atorvastatin (LIPITOR) 40 MG tablet Take 1 tablet (40 mg total) by mouth daily. 90 tablet 3  . dexamethasone (DECADRON) 4 MG tablet Take 10 mg at 10pm..... night before chemotherapy 10 tablet 0  . glipiZIDE (GLUCOTROL) 10 MG tablet Take 10 mg by mouth 2 (two) times daily before a meal.     . hyaluronate sodium (RADIAPLEXRX) GEL Apply 1 application topically as needed.    [name] HYDROcodone-acetaminophen (NORCO/VICODIN) 5-325 MG per tablet TAKE 1 TABLET BY MOUTH 4 TIMES DAILY AS NEEDED FOR BACK PAIN 30 tablet 0  . HYDROcodone-acetaminophen (NORCO/VICODIN) 5-325 MG per tablet TAKE 1 TABLET BY MOUTH 4 TIMES DAILY AS NEEDED FOR PAIN 30 tablet 0  . insulin detemir (LEVEMIR) 100 UNIT/ML injection Inject 85 Units into the skin 2 (two) times daily.     [name] levothyroxine (SYNTHROID, LEVOTHROID) 75 MCG tablet Take 75 mcg by mouth daily before breakfast.   0  . Liraglutide (VICTOZA) 18 MG/3ML SOPN Inject 1.8 mg into the skin daily.    . metoprolol succinate (TOPROL-XL) 100 MG 24 hr tablet Take 1 tablet (100 mg total) by mouth daily. Take with or immediately following a meal. 30 tablet 6  . metroNIDAZOLE (METROGEL) 0.75 % gel Apply topically 2 (two) times daily. 45 g 0  . nitroGLYCERIN (NITROSTAT) 0.4 MG SL tablet Place 1 tablet (0.4 mg total) under the tongue every 5 (five) minutes x 3 doses as needed for chest pain. (Patient not taking: Reported on 08/03/2015) 25 tablet 3  . NOVOLOG FLEXPEN 100 UNIT/ML FlexPen Inject 20 Units as directed daily.    0  . prochlorperazine (COMPAZINE) 10 MG tablet Take 1 tablet (10 mg total) by mouth every 6 (six) hours as needed for nausea or vomiting. 30 tablet 0  . ranitidine (ZANTAC) 300 MG tablet Take 1 tablet (300 mg total) by mouth daily as needed for heartburn. 30 tablet 6  . sucralfate (CARAFATE) 1 G tablet Take 1 tablet (1 g total) by mouth 4 (four) times daily. 120 tablet 2  . traMADol (ULTRAM) 50 MG tablet Take 1 tablet (50 mg total) by mouth every 6 (six) hours as needed for moderate pain. 30 tablet 0  . valsartan (DIOVAN) 160 MG tablet Take 1 tablet (160 mg total) by mouth daily. 30 tablet 11   No current facility-administered medications for this encounter.     ALLERGIES: Adhesive and Morphine and related   LABORATORY DATA:  Lab Results  Component Value Date   WBC 4.1 08/27/2015   HGB 14.4 08/27/2015   HCT 42.0 08/27/2015   MCV 83.3 08/27/2015   PLT 108* 08/27/2015   Lab Results  Component Value Date   NA 139 08/27/2015   K 4.1 08/27/2015   CL 103 07/20/2015   CO2 25 08/27/2015   Lab Results  Component Value Date   ALT <6 Repeated and Verified 08/27/2015   AST 16 08/27/2015   ALKPHOS 102 08/27/2015   BILITOT 1.40* 08/27/2015  NARRATIVE: Walter French was seen today for weekly treatment management. The chart was checked and the patient's films were reviewed.  The patient presents today with symptoms of pain when swallowing and vomiting. Patient has stated that after eating soft food or Ensure he feels nauseous and will vomit repeatedly. He does not drink water but instead diet Pepsi, which he says burns his throat. Denies taking potassium.   PHYSICAL EXAMINATION: blood pressure is 130/95 and his pulse is 116. His respiration is 18.     ASSESSMENT: The patient is doing satisfactorily with treatment, however is at risk for dehydration. He states that he is able to increase his fluid intake.We discussed the possible need for IV fluids if this is not possible.He feels  that he will be able to do this so we will hold off and recheck his vital signs tomorrow  PLAN: We will continue with the patient's radiation treatment as planned.   Advised the patient to drink Gatorade and to monitor urine frequency and color. I offered him to receive infusion of fluids today but he declined in favor of drinking fluids himself. I advised him to continue taking Carafate.   Patient scheduled to follow up with Ernestene Kiel on 09/03/2015 about nausea and vomiting associated with supplemental feedings.   ------------------------------------------------  Jodelle Gross, MD, PhD  This document serves as a record of services personally performed by Kyung Rudd, MD. It was created on his behalf by Derek Mound, a trained medical scribe. The creation of this record is based on the scribe's personal observations and the provider's statements to them. This document has been checked and approved by the attending provider.

## 2015-09-01 ENCOUNTER — Ambulatory Visit
Admission: RE | Admit: 2015-09-01 | Discharge: 2015-09-01 | Disposition: A | Discharge status: Home / Self Care | Payer: Medicare Other | Source: Ambulatory Visit | Attending: Radiation Oncology | Admitting: Radiation Oncology

## 2015-09-01 DIAGNOSIS — Z510 Encounter for antineoplastic radiation therapy: Secondary | ICD-10-CM | POA: Diagnosis not present

## 2015-09-01 NOTE — Progress Notes (Signed)
Patient presented to nursing for blood pressure check. Patient states, "I feel so much better."  Sitting pulse 84, sitting bp 124/67 Standing pulse 90, standing bp 111/71  Patient denies any further needs at this time.

## 2015-09-02 ENCOUNTER — Ambulatory Visit
Admission: RE | Admit: 2015-09-02 | Discharge: 2015-09-02 | Disposition: A | Discharge status: Home / Self Care | Payer: Medicare Other | Source: Ambulatory Visit | Attending: Radiation Oncology | Admitting: Radiation Oncology

## 2015-09-02 DIAGNOSIS — Z510 Encounter for antineoplastic radiation therapy: Secondary | ICD-10-CM | POA: Diagnosis not present

## 2015-09-03 ENCOUNTER — Ambulatory Visit: Payer: Medicare Other | Admitting: Nutrition

## 2015-09-03 ENCOUNTER — Ambulatory Visit (HOSPITAL_BASED_OUTPATIENT_CLINIC_OR_DEPARTMENT_OTHER): Payer: Medicare Other | Admitting: Nurse Practitioner

## 2015-09-03 ENCOUNTER — Ambulatory Visit (HOSPITAL_BASED_OUTPATIENT_CLINIC_OR_DEPARTMENT_OTHER): Payer: Medicare Other

## 2015-09-03 ENCOUNTER — Telehealth: Payer: Self-pay | Admitting: Oncology

## 2015-09-03 ENCOUNTER — Ambulatory Visit
Admission: RE | Admit: 2015-09-03 | Discharge: 2015-09-03 | Disposition: A | Discharge status: Home / Self Care | Payer: Medicare Other | Source: Ambulatory Visit | Attending: Radiation Oncology | Admitting: Radiation Oncology

## 2015-09-03 ENCOUNTER — Other Ambulatory Visit (HOSPITAL_BASED_OUTPATIENT_CLINIC_OR_DEPARTMENT_OTHER): Payer: Medicare Other

## 2015-09-03 VITALS — BP 123/74 | HR 90 | Temp 98.1°F | Resp 18 | Ht 72.0 in | Wt 232.9 lb

## 2015-09-03 DIAGNOSIS — C155 Malignant neoplasm of lower third of esophagus: Secondary | ICD-10-CM

## 2015-09-03 DIAGNOSIS — C160 Malignant neoplasm of cardia: Secondary | ICD-10-CM

## 2015-09-03 DIAGNOSIS — I255 Ischemic cardiomyopathy: Secondary | ICD-10-CM

## 2015-09-03 DIAGNOSIS — Z5111 Encounter for antineoplastic chemotherapy: Secondary | ICD-10-CM

## 2015-09-03 DIAGNOSIS — Z510 Encounter for antineoplastic radiation therapy: Secondary | ICD-10-CM | POA: Diagnosis not present

## 2015-09-03 LAB — CBC WITH DIFFERENTIAL/PLATELET
BASO%: 2.4 % — AB (ref 0.0–2.0)
BASOS ABS: 0.1 10*3/uL (ref 0.0–0.1)
EOS%: 6.9 % (ref 0.0–7.0)
Eosinophils Absolute: 0.2 10*3/uL (ref 0.0–0.5)
HCT: 39.4 % (ref 38.4–49.9)
HEMOGLOBIN: 13.2 g/dL (ref 13.0–17.1)
LYMPH%: 3.5 % — ABNORMAL LOW (ref 14.0–49.0)
MCH: 28 pg (ref 27.2–33.4)
MCHC: 33.6 g/dL (ref 32.0–36.0)
MCV: 83.4 fL (ref 79.3–98.0)
MONO#: 0.5 10*3/uL (ref 0.1–0.9)
MONO%: 18.2 % — AB (ref 0.0–14.0)
NEUT#: 1.7 10*3/uL (ref 1.5–6.5)
NEUT%: 69 % (ref 39.0–75.0)
Platelets: 92 10*3/uL — ABNORMAL LOW (ref 140–400)
RBC: 4.72 10*6/uL (ref 4.20–5.82)
RDW: 15.1 % — AB (ref 11.0–14.6)
WBC: 2.5 10*3/uL — ABNORMAL LOW (ref 4.0–10.3)
lymph#: 0.1 10*3/uL — ABNORMAL LOW (ref 0.9–3.3)

## 2015-09-03 MED ORDER — PROCHLORPERAZINE MALEATE 10 MG PO TABS: 10.0000 mg | ORAL_TABLET | Freq: Four times a day (QID) | ORAL | Status: DC | PRN

## 2015-09-03 MED ORDER — SODIUM CHLORIDE 0.9 % IV SOLN
240.0000 mg | Freq: Once | INTRAVENOUS | Status: AC
  Administered 2015-09-03: 240 mg via INTRAVENOUS
  Filled 2015-09-03: qty 24

## 2015-09-03 MED ORDER — HYDROCODONE-ACETAMINOPHEN 5-325 MG PO TABS: ORAL_TABLET | ORAL | Status: DC

## 2015-09-03 MED ORDER — FAMOTIDINE IN NACL 20-0.9 MG/50ML-% IV SOLN
INTRAVENOUS | Status: AC
  Filled 2015-09-03: qty 50

## 2015-09-03 MED ORDER — DIPHENHYDRAMINE HCL 50 MG/ML IJ SOLN
25.0000 mg | Freq: Once | INTRAMUSCULAR | Status: AC
  Administered 2015-09-03: 25 mg via INTRAVENOUS

## 2015-09-03 MED ORDER — SODIUM CHLORIDE 0.9 % IV SOLN
Freq: Once | INTRAVENOUS | Status: AC
  Administered 2015-09-03: 12:00:00 via INTRAVENOUS

## 2015-09-03 MED ORDER — FAMOTIDINE IN NACL 20-0.9 MG/50ML-% IV SOLN
20.0000 mg | Freq: Once | INTRAVENOUS | Status: AC
  Administered 2015-09-03: 20 mg via INTRAVENOUS

## 2015-09-03 MED ORDER — SODIUM CHLORIDE 0.9 % IV SOLN
Freq: Once | INTRAVENOUS | Status: AC
  Administered 2015-09-03: 13:00:00 via INTRAVENOUS
  Filled 2015-09-03: qty 8

## 2015-09-03 MED ORDER — SODIUM CHLORIDE 0.9 % IV SOLN
50.0000 mg/m2 | Freq: Once | INTRAVENOUS | Status: AC
  Administered 2015-09-03: 120 mg via INTRAVENOUS
  Filled 2015-09-03: qty 20

## 2015-09-03 MED ORDER — DIPHENHYDRAMINE HCL 50 MG/ML IJ SOLN
INTRAMUSCULAR | Status: AC
  Filled 2015-09-03: qty 1

## 2015-09-03 NOTE — Progress Notes (Signed)
  [facility] OFFICE PROGRESS NOTE   Diagnosis:  GE junction carcinoma  INTERVAL HISTORY:   Walter French returns as scheduled. He completed week 3 Taxol/carboplatin on 08/27/2015. No significant nausea. Over the past 5 days or so he has noted almost immediate regurgitation of solids with associated pain. He is able to tolerate liquids if he drinks slowly. He has had loose stools over the past week as well. He estimates 3-4 stools a day. No mouth sores. No numbness or tingling in his hands or feet.  Objective:  Vital signs in last 24 hours:  Blood pressure 123/74, pulse 90, temperature 98.1 F (36.7 C), temperature source Oral, resp. rate 18, height 6' (1.829 m), weight 232 lb 14.4 oz (105.643 kg), SpO2 100 %.    HEENT: No thrush or ulcers. Mucous membranes are moist. Resp: Lungs clear bilaterally. Cardio: Regular rate and rhythm. GI: Abdomen soft and nontender. No hepatomegaly. Vascular: Very minimal lower leg edema bilaterally. Skin: No rash.    Lab Results:  Lab Results  Component Value Date   WBC 2.5* 09/03/2015   HGB 13.2 09/03/2015   HCT 39.4 09/03/2015   MCV 83.4 09/03/2015   PLT 92* 09/03/2015   NEUTROABS 1.7 09/03/2015    Imaging:  No results found.  Medications: I have reviewed the patient's current medications.  Assessment/Plan: 1. Adenocarcinoma the gastroesophageal junction, status post an endoscopic biopsy of a distal esophagus mass and gastric cardia ulcer on 07/23/2015  Staging CT scans consistent with locally metastatic adenopathy  PET scan 08/07/2015 with hypermetabolic mass at the distal esophagus/GE junction and a hypermetabolic paraesophageal, gastrohepatic, and portacaval lymph nodes  Initiation of radiation 08/12/2015, cycle 1 Taxol/carboplatin 08/13/2015 2. History of coronary artery disease 3. Congestive heart failure 4. History of ventricular tachycardia and supraventricular tachycardia 5. Peripheral arterial  disease 6. Implantable defibrillator 7. Diabetes 8. Gout   Disposition: Mr. Grays continues radiation. He has completed 3 cycles of weekly Taxol/carboplatin. Plan to proceed with week 4 Taxol/carboplatin today as scheduled.   He is experiencing regurgitation of solids and odynophagia. 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 He will continue Carafate as prescribed by Dr. Lisbeth Renshaw. He is also taking ranitidine. He has hydrocodone for pain. He understands the importance of pushing fluids orally. I have spoken with Ernestene Kiel, dietitian. She will meet with Mr. Searls later today to discuss nutritional supplements.  For the loose stools he will try Imodium. He will contact the office if Imodium is not effective.  He has mild thrombocytopenia. He understands to call with any bleeding.  We will see him in follow-up on 09/09/2015. He will contact the office in the interim with any problems.  Plan reviewed with Dr. Benay Spice.    Ned Card ANP/GNP-BC   09/03/2015  11:19 AM

## 2015-09-03 NOTE — Telephone Encounter (Signed)
Gave adn printed appt sched and avs for pt for Sept and OCT °

## 2015-09-03 NOTE — Patient Instructions (Signed)
[facility] Discharge Instructions for Patients Receiving Chemotherapy  Today you received the following chemotherapy agents: Taxol and Carboplatin.  To help prevent nausea and vomiting after your treatment, we encourage you to take your nausea medication: Compazine. Take one every 6 hours as needed. If you develop nausea and vomiting that is not controlled by your nausea medication, call the clinic.   BELOW ARE SYMPTOMS THAT SHOULD BE REPORTED IMMEDIATELY:  *FEVER GREATER THAN 100.5 F  *CHILLS WITH OR WITHOUT FEVER  NAUSEA AND VOMITING THAT IS NOT CONTROLLED WITH YOUR NAUSEA MEDICATION  *UNUSUAL SHORTNESS OF BREATH  *UNUSUAL BRUISING OR BLEEDING  TENDERNESS IN MOUTH AND THROAT WITH OR WITHOUT PRESENCE OF ULCERS  *URINARY PROBLEMS  *BOWEL PROBLEMS  UNUSUAL RASH Items with * indicate a potential emergency and should be followed up as soon as possible.  Feel free to call the clinic should you have any questions or concerns. The clinic phone number is [phone].  Please show the CHEMO ALERT CARD at check-in to the Emergency Department and triage nurse.   

## 2015-09-03 NOTE — Progress Notes (Signed)
Nutrition follow-up completed with patient during IV infusion.  Patient being treated for cancer of the GE junction. Patient states he has nausea and vomiting with oral intake of both solids and thick liquids. Patient has tolerated water and Gatorade. Patient states he does not want to try any juices because of esophagitis. Weight decreased and documented as 232.9 pounds September 22, down from 243.8 pounds September 8. Patient does not have an appetite, nor does he enjoys the taste of food.  Nutrition diagnosis: Inadequate oral intake continues.  Patient meets criteria for severe malnutrition in the context of chronic illness secondary to 7% weight loss over one month and less than 75% energy intake for greater than one month.  Intervention:  Educated patient to thin consistency of Ensure Plus or boost plus with milk or water. Encouraged patient to try Carnation breakfast essentials as this is a thinner consistency than Ensure Plus. Patient prefers to wait until he goes home to try modifying consistencies of products. Patient has samples of oral nutrition supplements available. Teach back method was used.  Monitoring, evaluation, goals: Patient will tolerate increased oral nutrition supplements to minimize further weight loss of lean body mass. Patient agrees to contact me by phone if he cannot tolerate oral nutrition supplements.  Next visit: Thursday, September 29.  **Disclaimer: This note was dictated with voice recognition software. Similar sounding words can inadvertently be transcribed and this note may contain transcription errors which may not have been corrected upon publication of note.**

## 2015-09-04 ENCOUNTER — Ambulatory Visit
Admission: RE | Admit: 2015-09-04 | Discharge: 2015-09-04 | Disposition: A | Discharge status: Home / Self Care | Payer: Medicare Other | Source: Ambulatory Visit | Attending: Radiation Oncology | Admitting: Radiation Oncology

## 2015-09-04 ENCOUNTER — Encounter: Payer: Self-pay | Admitting: Radiation Oncology

## 2015-09-04 VITALS — BP 118/75 | HR 100 | Temp 97.3°F | Resp 20 | Wt 236.2 lb

## 2015-09-04 DIAGNOSIS — Z510 Encounter for antineoplastic radiation therapy: Secondary | ICD-10-CM | POA: Diagnosis not present

## 2015-09-04 DIAGNOSIS — C160 Malignant neoplasm of cardia: Secondary | ICD-10-CM

## 2015-09-04 NOTE — Progress Notes (Addendum)
Weekly rad txs gastroesophageal, blood sugar this am 86 ate cookie and water, then threw up, not nauseated now, no pain, still foods get stuck, no appetite,   Drinks water and gator ade onlyBut says he feels a lot better now, no skin changes using cream daily after rad txs had chemo yesterday, Ernestene Kiel seen in chemo room  10:11 AM BP 118/75 mmHg  Pulse 100  Temp(Src) 97.3 F (36.3 C) (Oral)  Resp 20  Wt 236 lb 3.2 oz (107.14 kg)  Wt Readings from Last 3 Encounters:  09/04/15 236 lb 3.2 oz (107.14 kg)  09/03/15 232 lb 14.4 oz (105.643 kg)  08/21/15 244 lb 12.8 oz (111.041 kg)

## 2015-09-04 NOTE — Progress Notes (Signed)
Department of Radiation Oncology  Phone:  [phone] Fax:        [phone]  Weekly Treatment Note    Name: Walter French Date: 09/04/2015 MRN: [id number] DOB: Oct 02, 1946   Current dose: 30.6 Gy  Current fraction:  17   MEDICATIONS: Current Outpatient Prescriptions  Medication Sig Dispense Refill  . allopurinol (ZYLOPRIM) 300 MG tablet Take 300 mg by mouth daily.      [name] aspirin 81 MG tablet Take 1 tablet (81 mg total) by mouth daily. 30 tablet 0  . atorvastatin (LIPITOR) 40 MG tablet Take 1 tablet (40 mg total) by mouth daily. 90 tablet 3  . dexamethasone (DECADRON) 4 MG tablet Take 10 mg at 10pm..... night before chemotherapy 10 tablet 0  . glipiZIDE (GLUCOTROL) 10 MG tablet Take 10 mg by mouth 2 (two) times daily before a meal.     . hyaluronate sodium (RADIAPLEXRX) GEL Apply 1 application topically as needed.    [name] HYDROcodone-acetaminophen (NORCO/VICODIN) 5-325 MG per tablet TAKE 1 TABLET BY MOUTH 4 TIMES DAILY AS NEEDED FOR PAIN 60 tablet 0  . insulin detemir (LEVEMIR) 100 UNIT/ML injection Inject 85 Units into the skin 2 (two) times daily.     [name] levothyroxine (SYNTHROID, LEVOTHROID) 75 MCG tablet Take 75 mcg by mouth daily before breakfast.   0  . Liraglutide (VICTOZA) 18 MG/3ML SOPN Inject 1.8 mg into the skin daily.    . metoprolol succinate (TOPROL-XL) 100 MG 24 hr tablet Take 1 tablet (100 mg total) by mouth daily. Take with or immediately following a meal. 30 tablet 6  . metroNIDAZOLE (METROGEL) 0.75 % gel Apply topically 2 (two) times daily. 45 g 0  . nitroGLYCERIN (NITROSTAT) 0.4 MG SL tablet Place 1 tablet (0.4 mg total) under the tongue every 5 (five) minutes x 3 doses as needed for chest pain. 25 tablet 3  . NOVOLOG FLEXPEN 100 UNIT/ML FlexPen Inject 20 Units as directed daily.   0  . prochlorperazine (COMPAZINE) 10 MG tablet Take 1 tablet (10 mg total) by mouth every 6 (six) hours as needed for nausea or vomiting. 30 tablet 1  . ranitidine (ZANTAC) 300 MG  tablet Take 1 tablet (300 mg total) by mouth daily as needed for heartburn. 30 tablet 6  . sucralfate (CARAFATE) 1 G tablet Take 1 tablet (1 g total) by mouth 4 (four) times daily. 120 tablet 2  . traMADol (ULTRAM) 50 MG tablet Take 1 tablet (50 mg total) by mouth every 6 (six) hours as needed for moderate pain. 30 tablet 0  . valsartan (DIOVAN) 160 MG tablet Take 1 tablet (160 mg total) by mouth daily. 30 tablet 11   No current facility-administered medications for this encounter.     ALLERGIES: Adhesive and Morphine and related   LABORATORY DATA:  Lab Results  Component Value Date   WBC 2.5* 09/03/2015   HGB 13.2 09/03/2015   HCT 39.4 09/03/2015   MCV 83.4 09/03/2015   PLT 92* 09/03/2015   Lab Results  Component Value Date   NA 139 08/27/2015   K 4.1 08/27/2015   CL 103 07/20/2015   CO2 25 08/27/2015   Lab Results  Component Value Date   ALT <6 Repeated and Verified 08/27/2015   AST 16 08/27/2015   ALKPHOS 102 08/27/2015   BILITOT 1.40* 08/27/2015     NARRATIVE: Walter French was seen today for weekly treatment management. The chart was checked and the patient's films were reviewed.  Weekly rad txs  gastroesophageal, blood sugar this am 86 ate cookie and water, then threw up, not nauseated now, no pain, still foods get stuck, no appetite,   Drinks water and gator ade onlyBut says he feels a lot better now, no skin changes using cream daily after rad txs had chemo yesterday, Walter French seen in chemo room  5:10 PM BP 118/75 mmHg  Pulse 100  Temp(Src) 97.3 F (36.3 C) (Oral)  Resp 20  Wt 236 lb 3.2 oz (107.14 kg)  Wt Readings from Last 3 Encounters:  09/04/15 236 lb 3.2 oz (107.14 kg)  09/03/15 232 lb 14.4 oz (105.643 kg)  08/21/15 244 lb 12.8 oz (111.041 kg)    PHYSICAL EXAMINATION: weight is 236 lb 3.2 oz (107.14 kg). His oral temperature is 97.3 F (36.3 C). His blood pressure is 118/75 and his pulse is 100. His respiration is 20.        ASSESSMENT: The  patient is doing satisfactorily with treatment.  PLAN: We will continue with the patient's radiation treatment as planned.

## 2015-09-07 ENCOUNTER — Ambulatory Visit
Admission: RE | Admit: 2015-09-07 | Discharge: 2015-09-07 | Disposition: A | Discharge status: Home / Self Care | Payer: Medicare Other | Source: Ambulatory Visit | Attending: Radiation Oncology | Admitting: Radiation Oncology

## 2015-09-08 ENCOUNTER — Ambulatory Visit
Admission: RE | Admit: 2015-09-08 | Discharge: 2015-09-08 | Disposition: A | Discharge status: Home / Self Care | Payer: Medicare Other | Source: Ambulatory Visit | Attending: Radiation Oncology | Admitting: Radiation Oncology

## 2015-09-09 ENCOUNTER — Other Ambulatory Visit (HOSPITAL_BASED_OUTPATIENT_CLINIC_OR_DEPARTMENT_OTHER): Payer: Medicare Other

## 2015-09-09 ENCOUNTER — Telehealth: Payer: Self-pay | Admitting: Nurse Practitioner

## 2015-09-09 ENCOUNTER — Ambulatory Visit
Admission: RE | Admit: 2015-09-09 | Discharge: 2015-09-09 | Disposition: A | Discharge status: Home / Self Care | Payer: Medicare Other | Source: Ambulatory Visit | Attending: Radiation Oncology | Admitting: Radiation Oncology

## 2015-09-09 ENCOUNTER — Ambulatory Visit (HOSPITAL_BASED_OUTPATIENT_CLINIC_OR_DEPARTMENT_OTHER): Payer: Medicare Other | Admitting: Nurse Practitioner

## 2015-09-09 VITALS — BP 121/59 | HR 95 | Temp 98.0°F | Resp 19 | Ht 72.0 in | Wt 229.2 lb

## 2015-09-09 DIAGNOSIS — I255 Ischemic cardiomyopathy: Secondary | ICD-10-CM | POA: Diagnosis not present

## 2015-09-09 DIAGNOSIS — C160 Malignant neoplasm of cardia: Secondary | ICD-10-CM

## 2015-09-09 LAB — CBC WITH DIFFERENTIAL/PLATELET
BASO%: 0.7 % (ref 0.0–2.0)
Basophils Absolute: 0 10*3/uL (ref 0.0–0.1)
EOS%: 2.7 % (ref 0.0–7.0)
Eosinophils Absolute: 0.1 10*3/uL (ref 0.0–0.5)
HCT: 36 % — ABNORMAL LOW (ref 38.4–49.9)
HGB: 12.6 g/dL — ABNORMAL LOW (ref 13.0–17.1)
LYMPH#: 0.3 10*3/uL — AB (ref 0.9–3.3)
LYMPH%: 8.8 % — AB (ref 14.0–49.0)
MCH: 28.7 pg (ref 27.2–33.4)
MCHC: 35 g/dL (ref 32.0–36.0)
MCV: 82 fL (ref 79.3–98.0)
MONO#: 0.4 10*3/uL (ref 0.1–0.9)
MONO%: 12.1 % (ref 0.0–14.0)
NEUT%: 75.7 % — ABNORMAL HIGH (ref 39.0–75.0)
NEUTROS ABS: 2.3 10*3/uL (ref 1.5–6.5)
PLATELETS: 68 10*3/uL — AB (ref 140–400)
RBC: 4.39 10*6/uL (ref 4.20–5.82)
RDW: 14.7 % — ABNORMAL HIGH (ref 11.0–14.6)
WBC: 3 10*3/uL — AB (ref 4.0–10.3)

## 2015-09-09 LAB — COMPREHENSIVE METABOLIC PANEL (CC13)
ANION GAP: 5 meq/L (ref 3–11)
AST: 20 U/L (ref 5–34)
Albumin: 3.4 g/dL — ABNORMAL LOW (ref 3.5–5.0)
Alkaline Phosphatase: 109 U/L (ref 40–150)
BILIRUBIN TOTAL: 1.8 mg/dL — AB (ref 0.20–1.20)
BUN: 11.2 mg/dL (ref 7.0–26.0)
CHLORIDE: 106 meq/L (ref 98–109)
CO2: 26 meq/L (ref 22–29)
CREATININE: 1.1 mg/dL (ref 0.7–1.3)
Calcium: 9.6 mg/dL (ref 8.4–10.4)
EGFR: 72 mL/min/{1.73_m2} — ABNORMAL LOW (ref >90)
GLUCOSE: 144 mg/dL — AB (ref 70–140)
Potassium: 4.4 mEq/L (ref 3.5–5.1)
Sodium: 137 mEq/L (ref 136–145)
TOTAL PROTEIN: 6.7 g/dL (ref 6.4–8.3)

## 2015-09-09 NOTE — Progress Notes (Signed)
  [facility] OFFICE PROGRESS NOTE   Diagnosis:  GE junction carcinoma  INTERVAL HISTORY:   Walter French returns as scheduled. He completed week 4 Taxol/carboplatin on 09/03/2015. He has mild intermittent nausea. No mouth sores. He estimates one loose stool a day. No numbness or tingling in his hands or feet. He denies odynophagia. He continues to note regurgitation of solids. He is tolerating room temperature fluids without difficulty. He denies any bleeding.  Objective:  Vital signs in last 24 hours:  Blood pressure 121/59, pulse 95, temperature 98 F (36.7 C), temperature source Oral, resp. rate 19, height 6' (1.829 m), weight 229 lb 3.2 oz (103.964 kg), SpO2 100 %.    HEENT: No thrush or ulcers. Mucous membranes are moist. Resp: Lungs clear bilaterally. Cardio: Regular rate and rhythm. GI: Abdomen soft and nontender. No hepatomegaly. Vascular: No leg edema.   Lab Results:  Lab Results  Component Value Date   WBC 3.0* 09/09/2015   HGB 12.6* 09/09/2015   HCT 36.0* 09/09/2015   MCV 82.0 09/09/2015   PLT 68* 09/09/2015   NEUTROABS 2.3 09/09/2015    Imaging:  No results found.  Medications: I have reviewed the patient's current medications.  Assessment/Plan: 1. Adenocarcinoma the gastroesophageal junction, status post an endoscopic biopsy of a distal esophagus mass and gastric cardia ulcer on 07/23/2015  Staging CT scans consistent with locally metastatic adenopathy  PET scan 08/07/2015 with hypermetabolic mass at the distal esophagus/GE junction and a hypermetabolic paraesophageal, gastrohepatic, and portacaval lymph nodes  Initiation of radiation 08/12/2015, cycle 1 Taxol/carboplatin 08/13/2015 2. History of coronary artery disease 3. Congestive heart failure 4. History of ventricular tachycardia and supraventricular tachycardia 5. Peripheral arterial disease 6. Implantable defibrillator 7. Diabetes 8. Gout   Disposition: Walter French appears  stable. He continues radiation. He has completed 4 weekly treatments with Taxol/carboplatin. He is thrombocytopenic on labs today. We will hold week 5 Taxol/carboplatin and reschedule to 09/17/2015. We will see him in follow-up prior to treatment that day. He will contact the office in the interim with any problems.  Plan reviewed with Dr. Benay Spice.    Ned Card ANP/GNP-BC   09/09/2015  11:50 AM

## 2015-09-09 NOTE — Telephone Encounter (Signed)
Called patient and he is aware of his 10/6 appointments

## 2015-09-10 ENCOUNTER — Ambulatory Visit: Payer: Medicare Other

## 2015-09-10 ENCOUNTER — Other Ambulatory Visit: Payer: Self-pay

## 2015-09-10 ENCOUNTER — Inpatient Hospital Stay (HOSPITAL_COMMUNITY): Payer: Medicare Other

## 2015-09-10 ENCOUNTER — Other Ambulatory Visit: Payer: Medicare Other

## 2015-09-10 ENCOUNTER — Encounter: Payer: Medicare Other | Admitting: Nutrition

## 2015-09-10 ENCOUNTER — Ambulatory Visit
Admit: 2015-09-10 | Discharge: 2015-09-10 | Disposition: A | Discharge status: Home / Self Care | Payer: Medicare Other | Attending: Radiation Oncology | Admitting: Radiation Oncology

## 2015-09-10 ENCOUNTER — Emergency Department (HOSPITAL_COMMUNITY): Payer: Medicare Other

## 2015-09-10 ENCOUNTER — Encounter: Payer: Self-pay | Admitting: *Deleted

## 2015-09-10 ENCOUNTER — Inpatient Hospital Stay (HOSPITAL_COMMUNITY)
Admission: EM | Admit: 2015-09-10 | Discharge: 2015-09-15 | DRG: 871 | Disposition: A | Discharge status: Home Health | Payer: Medicare Other | Attending: Internal Medicine | Admitting: Internal Medicine

## 2015-09-10 ENCOUNTER — Encounter (HOSPITAL_COMMUNITY): Payer: Self-pay | Admitting: Emergency Medicine

## 2015-09-10 DIAGNOSIS — K219 Gastro-esophageal reflux disease without esophagitis: Secondary | ICD-10-CM | POA: Diagnosis present

## 2015-09-10 DIAGNOSIS — E669 Obesity, unspecified: Secondary | ICD-10-CM | POA: Diagnosis present

## 2015-09-10 DIAGNOSIS — D631 Anemia in chronic kidney disease: Secondary | ICD-10-CM | POA: Diagnosis present

## 2015-09-10 DIAGNOSIS — R55 Syncope and collapse: Secondary | ICD-10-CM | POA: Diagnosis present

## 2015-09-10 DIAGNOSIS — I481 Persistent atrial fibrillation: Secondary | ICD-10-CM | POA: Diagnosis not present

## 2015-09-10 DIAGNOSIS — E1129 Type 2 diabetes mellitus with other diabetic kidney complication: Secondary | ICD-10-CM | POA: Diagnosis present

## 2015-09-10 DIAGNOSIS — E039 Hypothyroidism, unspecified: Secondary | ICD-10-CM | POA: Diagnosis present

## 2015-09-10 DIAGNOSIS — G4733 Obstructive sleep apnea (adult) (pediatric): Secondary | ICD-10-CM | POA: Diagnosis present

## 2015-09-10 DIAGNOSIS — Z951 Presence of aortocoronary bypass graft: Secondary | ICD-10-CM | POA: Diagnosis not present

## 2015-09-10 DIAGNOSIS — E785 Hyperlipidemia, unspecified: Secondary | ICD-10-CM | POA: Diagnosis present

## 2015-09-10 DIAGNOSIS — I2510 Atherosclerotic heart disease of native coronary artery without angina pectoris: Secondary | ICD-10-CM | POA: Diagnosis present

## 2015-09-10 DIAGNOSIS — N183 Chronic kidney disease, stage 3 unspecified: Secondary | ICD-10-CM | POA: Diagnosis present

## 2015-09-10 DIAGNOSIS — T451X5A Adverse effect of antineoplastic and immunosuppressive drugs, initial encounter: Secondary | ICD-10-CM | POA: Diagnosis present

## 2015-09-10 DIAGNOSIS — I4891 Unspecified atrial fibrillation: Secondary | ICD-10-CM | POA: Diagnosis present

## 2015-09-10 DIAGNOSIS — R42 Dizziness and giddiness: Secondary | ICD-10-CM | POA: Diagnosis not present

## 2015-09-10 DIAGNOSIS — M109 Gout, unspecified: Secondary | ICD-10-CM | POA: Diagnosis present

## 2015-09-10 DIAGNOSIS — D701 Agranulocytosis secondary to cancer chemotherapy: Secondary | ICD-10-CM | POA: Diagnosis present

## 2015-09-10 DIAGNOSIS — Z794 Long term (current) use of insulin: Secondary | ICD-10-CM

## 2015-09-10 DIAGNOSIS — D72819 Decreased white blood cell count, unspecified: Secondary | ICD-10-CM

## 2015-09-10 DIAGNOSIS — R0902 Hypoxemia: Secondary | ICD-10-CM

## 2015-09-10 DIAGNOSIS — Z87891 Personal history of nicotine dependence: Secondary | ICD-10-CM

## 2015-09-10 DIAGNOSIS — C779 Secondary and unspecified malignant neoplasm of lymph node, unspecified: Secondary | ICD-10-CM | POA: Diagnosis present

## 2015-09-10 DIAGNOSIS — J9811 Atelectasis: Secondary | ICD-10-CM | POA: Diagnosis present

## 2015-09-10 DIAGNOSIS — C160 Malignant neoplasm of cardia: Secondary | ICD-10-CM | POA: Diagnosis present

## 2015-09-10 DIAGNOSIS — J9601 Acute respiratory failure with hypoxia: Secondary | ICD-10-CM | POA: Diagnosis present

## 2015-09-10 DIAGNOSIS — N179 Acute kidney failure, unspecified: Secondary | ICD-10-CM | POA: Diagnosis present

## 2015-09-10 DIAGNOSIS — I248 Other forms of acute ischemic heart disease: Secondary | ICD-10-CM | POA: Diagnosis present

## 2015-09-10 DIAGNOSIS — D638 Anemia in other chronic diseases classified elsewhere: Secondary | ICD-10-CM | POA: Diagnosis present

## 2015-09-10 DIAGNOSIS — E1122 Type 2 diabetes mellitus with diabetic chronic kidney disease: Secondary | ICD-10-CM | POA: Diagnosis present

## 2015-09-10 DIAGNOSIS — J479 Bronchiectasis, uncomplicated: Secondary | ICD-10-CM | POA: Diagnosis present

## 2015-09-10 DIAGNOSIS — N189 Chronic kidney disease, unspecified: Secondary | ICD-10-CM

## 2015-09-10 DIAGNOSIS — Z803 Family history of malignant neoplasm of breast: Secondary | ICD-10-CM | POA: Diagnosis not present

## 2015-09-10 DIAGNOSIS — D630 Anemia in neoplastic disease: Secondary | ICD-10-CM | POA: Diagnosis present

## 2015-09-10 DIAGNOSIS — D696 Thrombocytopenia, unspecified: Secondary | ICD-10-CM

## 2015-09-10 DIAGNOSIS — I5042 Chronic combined systolic (congestive) and diastolic (congestive) heart failure: Secondary | ICD-10-CM | POA: Diagnosis present

## 2015-09-10 DIAGNOSIS — I255 Ischemic cardiomyopathy: Secondary | ICD-10-CM | POA: Diagnosis present

## 2015-09-10 DIAGNOSIS — C159 Malignant neoplasm of esophagus, unspecified: Secondary | ICD-10-CM

## 2015-09-10 DIAGNOSIS — Z8249 Family history of ischemic heart disease and other diseases of the circulatory system: Secondary | ICD-10-CM

## 2015-09-10 DIAGNOSIS — Z95810 Presence of automatic (implantable) cardiac defibrillator: Secondary | ICD-10-CM | POA: Diagnosis not present

## 2015-09-10 DIAGNOSIS — I480 Paroxysmal atrial fibrillation: Secondary | ICD-10-CM | POA: Diagnosis present

## 2015-09-10 DIAGNOSIS — E872 Acidosis: Secondary | ICD-10-CM | POA: Diagnosis present

## 2015-09-10 DIAGNOSIS — Z8701 Personal history of pneumonia (recurrent): Secondary | ICD-10-CM

## 2015-09-10 DIAGNOSIS — I129 Hypertensive chronic kidney disease with stage 1 through stage 4 chronic kidney disease, or unspecified chronic kidney disease: Secondary | ICD-10-CM | POA: Diagnosis present

## 2015-09-10 DIAGNOSIS — R778 Other specified abnormalities of plasma proteins: Secondary | ICD-10-CM | POA: Diagnosis present

## 2015-09-10 DIAGNOSIS — D899 Disorder involving the immune mechanism, unspecified: Secondary | ICD-10-CM | POA: Diagnosis present

## 2015-09-10 DIAGNOSIS — D61810 Antineoplastic chemotherapy induced pancytopenia: Secondary | ICD-10-CM | POA: Diagnosis present

## 2015-09-10 DIAGNOSIS — A419 Sepsis, unspecified organism: Principal | ICD-10-CM | POA: Diagnosis present

## 2015-09-10 DIAGNOSIS — R7989 Other specified abnormal findings of blood chemistry: Secondary | ICD-10-CM

## 2015-09-10 LAB — I-STAT TROPONIN, ED: TROPONIN I, POC: 0.03 ng/mL (ref 0.00–0.08)

## 2015-09-10 LAB — COMPREHENSIVE METABOLIC PANEL
ALBUMIN: 3.5 g/dL (ref 3.5–5.0)
ALT: 10 U/L — ABNORMAL LOW (ref 17–63)
AST: 26 U/L (ref 15–41)
Alkaline Phosphatase: 93 U/L (ref 38–126)
Anion gap: 5 (ref 5–15)
BILIRUBIN TOTAL: 1.9 mg/dL — AB (ref 0.3–1.2)
BUN: 16 mg/dL (ref 6–20)
CALCIUM: 8.9 mg/dL (ref 8.9–10.3)
CO2: 24 mmol/L (ref 22–32)
CREATININE: 1.29 mg/dL — AB (ref 0.61–1.24)
Chloride: 105 mmol/L (ref 101–111)
GFR calc Af Amer: >60 mL/min (ref >60)
GFR, EST NON AFRICAN AMERICAN: 56 mL/min — AB (ref >60)
GLUCOSE: 142 mg/dL — AB (ref 65–99)
POTASSIUM: 3.5 mmol/L (ref 3.5–5.1)
Sodium: 134 mmol/L — ABNORMAL LOW (ref 135–145)
TOTAL PROTEIN: 6.8 g/dL (ref 6.5–8.1)

## 2015-09-10 LAB — I-STAT CG4 LACTIC ACID, ED
LACTIC ACID, VENOUS: 2.18 mmol/L — AB (ref 0.5–2.0)
LACTIC ACID, VENOUS: 1.93 mmol/L (ref 0.5–2.0)

## 2015-09-10 LAB — CBC WITH DIFFERENTIAL/PLATELET
BASOS ABS: 0 10*3/uL (ref 0.0–0.1)
Basophils Relative: 1 %
EOS ABS: 0.2 10*3/uL (ref 0.0–0.7)
Eosinophils Relative: 4 %
HCT: 35.9 % — ABNORMAL LOW (ref 39.0–52.0)
HEMOGLOBIN: 12.3 g/dL — AB (ref 13.0–17.0)
LYMPHS ABS: 0.3 10*3/uL — AB (ref 0.7–4.0)
Lymphocytes Relative: 8 %
MCH: 28.3 pg (ref 26.0–34.0)
MCHC: 34.3 g/dL (ref 30.0–36.0)
MCV: 82.5 fL (ref 78.0–100.0)
MONO ABS: 0.5 10*3/uL (ref 0.1–1.0)
Monocytes Relative: 14 %
NEUTROS ABS: 2.9 10*3/uL (ref 1.7–7.7)
Neutrophils Relative %: 73 %
PLATELETS: 100 10*3/uL — AB (ref 150–400)
RBC: 4.35 MIL/uL (ref 4.22–5.81)
RDW: 15 % (ref 11.5–15.5)
WBC: 3.9 10*3/uL — AB (ref 4.0–10.5)

## 2015-09-10 LAB — PROTIME-INR
INR: 1.06 (ref 0.00–1.49)
Prothrombin Time: 14 seconds (ref 11.6–15.2)

## 2015-09-10 LAB — APTT: aPTT: 27 seconds (ref 24–37)

## 2015-09-10 LAB — TROPONIN I: Troponin I: 0.06 ng/mL — ABNORMAL HIGH (ref <0.031)

## 2015-09-10 LAB — PROCALCITONIN: PROCALCITONIN: 0.13 ng/mL

## 2015-09-10 LAB — MRSA PCR SCREENING: MRSA BY PCR: NEGATIVE

## 2015-09-10 LAB — LACTIC ACID, PLASMA
LACTIC ACID, VENOUS: 2.3 mmol/L — AB (ref 0.5–2.0)
Lactic Acid, Venous: 1.8 mmol/L (ref 0.5–2.0)

## 2015-09-10 LAB — TSH: TSH: 0.461 u[IU]/mL (ref 0.350–4.500)

## 2015-09-10 LAB — BRAIN NATRIURETIC PEPTIDE: B NATRIURETIC PEPTIDE 5: 254.6 pg/mL — AB (ref 0.0–100.0)

## 2015-09-10 MED ORDER — PIPERACILLIN-TAZOBACTAM 3.375 G IVPB: 3.3750 g | Freq: Three times a day (TID) | INTRAVENOUS | Status: DC

## 2015-09-10 MED ORDER — ASPIRIN EC 81 MG PO TBEC
81.0000 mg | DELAYED_RELEASE_TABLET | Freq: Every day | ORAL | Status: DC
  Administered 2015-09-11 – 2015-09-12 (×2): 81 mg via ORAL
  Filled 2015-09-10 (×3): qty 1

## 2015-09-10 MED ORDER — ALLOPURINOL 300 MG PO TABS
300.0000 mg | ORAL_TABLET | Freq: Every day | ORAL | Status: DC
  Administered 2015-09-11 – 2015-09-15 (×5): 300 mg via ORAL
  Filled 2015-09-10: qty 1
  Filled 2015-09-10: qty 3
  Filled 2015-09-10: qty 1
  Filled 2015-09-10 (×2): qty 3

## 2015-09-10 MED ORDER — METOPROLOL SUCCINATE ER 100 MG PO TB24: 100.0000 mg | ORAL_TABLET | Freq: Every day | ORAL | Status: DC

## 2015-09-10 MED ORDER — ENOXAPARIN SODIUM 30 MG/0.3ML ~~LOC~~ SOLN: 30.0000 mg | SUBCUTANEOUS | Status: DC

## 2015-09-10 MED ORDER — SODIUM CHLORIDE 0.9 % IV BOLUS (SEPSIS)
30.0000 mL/kg | Freq: Once | INTRAVENOUS | Status: AC
  Administered 2015-09-10: 3048 mL via INTRAVENOUS

## 2015-09-10 MED ORDER — LIRAGLUTIDE 18 MG/3ML ~~LOC~~ SOPN: 1.8000 mg | PEN_INJECTOR | Freq: Every day | SUBCUTANEOUS | Status: DC

## 2015-09-10 MED ORDER — ACETAMINOPHEN 650 MG RE SUPP: 650.0000 mg | Freq: Four times a day (QID) | RECTAL | Status: DC | PRN

## 2015-09-10 MED ORDER — ACETAMINOPHEN 325 MG PO TABS: 650.0000 mg | ORAL_TABLET | Freq: Four times a day (QID) | ORAL | Status: DC | PRN

## 2015-09-10 MED ORDER — HYDROCODONE-ACETAMINOPHEN 5-325 MG PO TABS
1.0000 | ORAL_TABLET | ORAL | Status: DC | PRN
  Administered 2015-09-11 – 2015-09-13 (×5): 1 via ORAL
  Filled 2015-09-10 (×5): qty 1

## 2015-09-10 MED ORDER — ATORVASTATIN CALCIUM 40 MG PO TABS
40.0000 mg | ORAL_TABLET | Freq: Every day | ORAL | Status: DC
  Administered 2015-09-11 – 2015-09-14 (×4): 40 mg via ORAL
  Filled 2015-09-10 (×5): qty 1

## 2015-09-10 MED ORDER — IPRATROPIUM BROMIDE 0.02 % IN SOLN: 0.5000 mg | RESPIRATORY_TRACT | Status: DC | PRN

## 2015-09-10 MED ORDER — ENOXAPARIN SODIUM 40 MG/0.4ML ~~LOC~~ SOLN
40.0000 mg | SUBCUTANEOUS | Status: DC
  Administered 2015-09-11: 40 mg via SUBCUTANEOUS
  Filled 2015-09-10 (×2): qty 0.4

## 2015-09-10 MED ORDER — SODIUM CHLORIDE 0.9 % IJ SOLN
3.0000 mL | Freq: Two times a day (BID) | INTRAMUSCULAR | Status: DC
  Administered 2015-09-11 – 2015-09-15 (×8): 3 mL via INTRAVENOUS

## 2015-09-10 MED ORDER — LEVALBUTEROL HCL 1.25 MG/0.5ML IN NEBU
1.2500 mg | INHALATION_SOLUTION | RESPIRATORY_TRACT | Status: DC | PRN
  Filled 2015-09-10: qty 0.5

## 2015-09-10 MED ORDER — IOHEXOL 350 MG/ML SOLN
100.0000 mL | Freq: Once | INTRAVENOUS | Status: AC | PRN
  Administered 2015-09-10: 100 mL via INTRAVENOUS

## 2015-09-10 MED ORDER — SODIUM CHLORIDE 0.9 % IV BOLUS (SEPSIS): 500.0000 mL | INTRAVENOUS | Status: DC

## 2015-09-10 MED ORDER — INSULIN ASPART 100 UNIT/ML ~~LOC~~ SOLN: 0.0000 [IU] | Freq: Every day | SUBCUTANEOUS | Status: DC

## 2015-09-10 MED ORDER — INSULIN DETEMIR 100 UNIT/ML ~~LOC~~ SOLN
50.0000 [IU] | Freq: Two times a day (BID) | SUBCUTANEOUS | Status: DC
  Filled 2015-09-10 (×4): qty 0.5

## 2015-09-10 MED ORDER — INSULIN ASPART 100 UNIT/ML ~~LOC~~ SOLN
0.0000 [IU] | Freq: Three times a day (TID) | SUBCUTANEOUS | Status: DC
  Administered 2015-09-12: 6 [IU] via SUBCUTANEOUS
  Administered 2015-09-13 – 2015-09-14 (×3): 2 [IU] via SUBCUTANEOUS
  Administered 2015-09-14 – 2015-09-15 (×2): 3 [IU] via SUBCUTANEOUS

## 2015-09-10 MED ORDER — ACETAMINOPHEN 325 MG PO TABS
650.0000 mg | ORAL_TABLET | Freq: Once | ORAL | Status: AC
  Administered 2015-09-10: 650 mg via ORAL
  Filled 2015-09-10: qty 2

## 2015-09-10 MED ORDER — INSULIN ASPART 100 UNIT/ML FLEXPEN: 20.0000 [IU] | PEN_INJECTOR | Freq: Every day | SUBCUTANEOUS | Status: DC

## 2015-09-10 MED ORDER — IPRATROPIUM BROMIDE 0.02 % IN SOLN
0.5000 mg | RESPIRATORY_TRACT | Status: DC
  Administered 2015-09-10: 0.5 mg via RESPIRATORY_TRACT
  Filled 2015-09-10: qty 2.5

## 2015-09-10 MED ORDER — INSULIN DETEMIR 100 UNIT/ML ~~LOC~~ SOLN: 85.0000 [IU] | Freq: Two times a day (BID) | SUBCUTANEOUS | Status: DC

## 2015-09-10 MED ORDER — SODIUM CHLORIDE 0.9 % IV SOLN: 1250.0000 mg | INTRAVENOUS | Status: DC

## 2015-09-10 MED ORDER — SODIUM CHLORIDE 0.9 % IV SOLN
INTRAVENOUS | Status: DC
  Administered 2015-09-10: 21:00:00 via INTRAVENOUS
  Administered 2015-09-12: 10 mL via INTRAVENOUS

## 2015-09-10 MED ORDER — LEVALBUTEROL HCL 1.25 MG/0.5ML IN NEBU
1.2500 mg | INHALATION_SOLUTION | RESPIRATORY_TRACT | Status: DC
  Administered 2015-09-10: 1.25 mg via RESPIRATORY_TRACT
  Filled 2015-09-10: qty 0.5

## 2015-09-10 MED ORDER — ONDANSETRON HCL 4 MG/2ML IJ SOLN
4.0000 mg | Freq: Four times a day (QID) | INTRAMUSCULAR | Status: DC | PRN
  Administered 2015-09-11 – 2015-09-13 (×5): 4 mg via INTRAVENOUS
  Filled 2015-09-10 (×5): qty 2

## 2015-09-10 MED ORDER — PIPERACILLIN-TAZOBACTAM 3.375 G IVPB
3.3750 g | Freq: Three times a day (TID) | INTRAVENOUS | Status: DC
  Administered 2015-09-11 – 2015-09-12 (×5): 3.375 g via INTRAVENOUS
  Filled 2015-09-10 (×4): qty 50

## 2015-09-10 MED ORDER — DEXTROSE 5 % IV SOLN
1.0000 g | Freq: Once | INTRAVENOUS | Status: AC
  Administered 2015-09-10: 1 g via INTRAVENOUS
  Filled 2015-09-10: qty 10

## 2015-09-10 MED ORDER — LEVALBUTEROL HCL 1.25 MG/0.5ML IN NEBU
1.2500 mg | INHALATION_SOLUTION | Freq: Four times a day (QID) | RESPIRATORY_TRACT | Status: DC
Start: 2015-09-11 — End: 2015-09-12
  Administered 2015-09-11 (×3): 1.25 mg via RESPIRATORY_TRACT
  Filled 2015-09-10 (×4): qty 0.5

## 2015-09-10 MED ORDER — VANCOMYCIN HCL IN DEXTROSE 1-5 GM/200ML-% IV SOLN: 1000.0000 mg | Freq: Once | INTRAVENOUS | Status: DC

## 2015-09-10 MED ORDER — ACETAMINOPHEN 500 MG PO TABS
1000.0000 mg | ORAL_TABLET | Freq: Four times a day (QID) | ORAL | Status: DC | PRN
  Administered 2015-09-11: 1000 mg via ORAL
  Administered 2015-09-13: 500 mg via ORAL
  Administered 2015-09-14: 1000 mg via ORAL
  Filled 2015-09-10 (×3): qty 2

## 2015-09-10 MED ORDER — GLIPIZIDE 10 MG PO TABS
10.0000 mg | ORAL_TABLET | Freq: Two times a day (BID) | ORAL | Status: DC
  Administered 2015-09-12 – 2015-09-15 (×7): 10 mg via ORAL
  Filled 2015-09-10 (×11): qty 1

## 2015-09-10 MED ORDER — DILTIAZEM HCL 100 MG IV SOLR
5.0000 mg/h | Freq: Once | INTRAVENOUS | Status: AC
  Administered 2015-09-10: 5 mg/h via INTRAVENOUS
  Filled 2015-09-10: qty 100

## 2015-09-10 MED ORDER — MORPHINE SULFATE (PF) 2 MG/ML IV SOLN: 1.0000 mg | INTRAVENOUS | Status: DC | PRN

## 2015-09-10 MED ORDER — LEVOTHYROXINE SODIUM 75 MCG PO TABS
75.0000 ug | ORAL_TABLET | Freq: Every day | ORAL | Status: DC
  Administered 2015-09-11 – 2015-09-15 (×5): 75 ug via ORAL
  Filled 2015-09-10 (×6): qty 1

## 2015-09-10 MED ORDER — ACETAMINOPHEN 325 MG PO TABS
ORAL_TABLET | ORAL | Status: AC
  Filled 2015-09-10: qty 1

## 2015-09-10 MED ORDER — PIPERACILLIN-TAZOBACTAM 3.375 G IVPB 30 MIN: 3.3750 g | Freq: Once | INTRAVENOUS | Status: DC

## 2015-09-10 MED ORDER — FAMOTIDINE 20 MG PO TABS
20.0000 mg | ORAL_TABLET | Freq: Every day | ORAL | Status: DC
  Administered 2015-09-11 – 2015-09-15 (×5): 20 mg via ORAL
  Filled 2015-09-10 (×5): qty 1

## 2015-09-10 MED ORDER — SODIUM CHLORIDE 0.9 % IV BOLUS (SEPSIS)
1000.0000 mL | INTRAVENOUS | Status: DC
  Administered 2015-09-10 (×2): 1000 mL via INTRAVENOUS

## 2015-09-10 MED ORDER — IPRATROPIUM BROMIDE 0.02 % IN SOLN
0.5000 mg | Freq: Four times a day (QID) | RESPIRATORY_TRACT | Status: DC
  Administered 2015-09-11 (×3): 0.5 mg via RESPIRATORY_TRACT
  Filled 2015-09-10 (×4): qty 2.5

## 2015-09-10 MED ORDER — VANCOMYCIN HCL 10 G IV SOLR
2000.0000 mg | INTRAVENOUS | Status: AC
  Administered 2015-09-10: 2000 mg via INTRAVENOUS
  Filled 2015-09-10: qty 2000

## 2015-09-10 MED ORDER — SUCRALFATE 1 G PO TABS
1.0000 g | ORAL_TABLET | Freq: Once | ORAL | Status: AC
  Administered 2015-09-10: 1 g via ORAL
  Filled 2015-09-10: qty 1

## 2015-09-10 MED ORDER — DEXTROSE 5 % IV SOLN
1.0000 g | INTRAVENOUS | Status: DC
  Administered 2015-09-10: 1 g via INTRAVENOUS

## 2015-09-10 MED ORDER — ONDANSETRON HCL 4 MG PO TABS
4.0000 mg | ORAL_TABLET | Freq: Four times a day (QID) | ORAL | Status: DC | PRN
Start: 2015-09-10 — End: 2015-09-15
  Administered 2015-09-13 – 2015-09-15 (×3): 4 mg via ORAL
  Filled 2015-09-10 (×3): qty 1

## 2015-09-10 MED ORDER — IRBESARTAN 150 MG PO TABS: 150.0000 mg | ORAL_TABLET | Freq: Every day | ORAL | Status: DC

## 2015-09-10 MED ORDER — PROCHLORPERAZINE MALEATE 10 MG PO TABS
10.0000 mg | ORAL_TABLET | Freq: Four times a day (QID) | ORAL | Status: DC | PRN
  Administered 2015-09-12 – 2015-09-14 (×5): 10 mg via ORAL
  Filled 2015-09-10 (×5): qty 1

## 2015-09-10 MED ORDER — SUCRALFATE 1 G PO TABS
1.0000 g | ORAL_TABLET | Freq: Four times a day (QID) | ORAL | Status: DC
  Administered 2015-09-11 – 2015-09-15 (×14): 1 g via ORAL
  Filled 2015-09-10 (×18): qty 1

## 2015-09-10 NOTE — ED Notes (Signed)
Pt went in for Chemo treatment. Pt has had several "dizzy" spells since waking this morning. Pt has been having trouble eating and keeping food down since starting treatments. Upon ecking in at Chemo center today the nurse advised against having treatment today and advised that he go to the ED. Pt states that the nurse said it was "because of his blood".

## 2015-09-10 NOTE — H&P (Signed)
[facility] History and Physical  Walter French VQQ:[id number] DOB: August 16, 1946 DOA: 09/10/2015  Referring physician: ER physician: Dr. 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 Patient was just seen in cancer center 09/09/2015 for blood work and oncology decided that his next chemo will be rescheduled for 09/17/2015 due to thrombocytopenia.  Patient presented to [facility] ED with repots of dizzy spells, seeing spots and feeling weak. He also reports feeling some shortness of breath but says dizziness is of most concern to him. He also reports having issues with swallowing solids ever since his diagnosis of GE junction cancer and chemotherapy. No chest pain, no palpitations. No abdominal pain. He has nausea but no vomiting. No lightheadedness although he did say he felt as if he will pass out. No fevers or chills. No cough.  In ED, BP was 121/59, HR 125, RR 19, T max 98 F and oxygen saturation as low as 79% on room air. This has improved with [place] oxygen support to 100%. The 12 lead EKG showed atrial fibrillation. He was started on Cardizem drip. The blood work was notable for WBC count of 3.9, hemoglobin of 12.3, platelets of 100, creatinine of 1.29. CXR showed mild left lower lobe atelectasis. Lactic acid was 2.18 but UA not obtained on admission. Pt was given empiric Rocephin in ED. Sepsis criteria are met but no source of infection identified at present. Sepsis work up initiated and pt started on zosyn and vanco. Of obvious concern is pulmonary  embolism so we ordered CT angio chest PE protocol. Per radiation, ok to proceed with CT because renal function almost within normal. Patient will be admitted to SDU. Cardiology consulted.   Of note, cardio recommended if BP too low for Cardizem then wean off of cardizem and start amiodarone infusion. At present, BP within good range so Ok to continue Cardizem.    Assessment & Plan    Principal Problem:   Acute respiratory failure with hypoxia - Unclear etiology, mild atelectasis on mid left lower lung lobe seen on CXR - Rule out pulmonary embolism. CT angio chest pending. - Start xopenex and Atrovent scheduled and as needed for shortness of breath - Admit to SDU - Use oxygen support via [place] to keep O2 saturation above 90%  Active Problems:   Sepsis, unspecified organism - Sepsis criteria met with tachycardia, tachypnea, hypoxia, leukopenia, lactic acidosis. Pt did not spike fevers but he is immunocompromised and has had recent chemo 09/03/15. - Sepsis work up initiated - Follow up blood culture results. Obtain UA and urine culture - Follow up procalcitonin level - Started vanco and zosyn empirically    Atrial fibrillation, new onset - Likely triggered by sepsis, hypoxia - CHADS vasc score at least 5 - On IV Cardizem drip - May need to switch to amiodarone if BP trends down - In regards to anticoagulation, will wait for cardio consult. Platelets are 100 but will have to repeat blood work to ensure stability in platelets prior to instituting anticoagulation.      Hyperlipidemia - Continue statin therapy    Systolic and diastolic CHF, chronic / Automatic implantable cardioverter-defibrillator in  situ / Coronary artery disease-status post CABG - Last 2 D ECHO in  07/2015 with EF 30-35% with grade 2 diastolic dysfunction  - Follow upon cardiology recommendations  - Has ICD - Not on lasix and currently would not be able to get due to soft BP    Near syncope - Likely due to hypoxia, a  fib - Continue to monitor on telemetry - PT evaluation once he is able to participate     Gastroesophageal cancer - Managed by Dr. Benay Spice - Last chemo 09/03/15 - Continue RT as per schedule     CKD (chronic kidney disease) stage 3, GFR 30-59 ml/min - Baseline creatinine 1.6 about 8 years ago - On this admission, Cr is 1.29, stable    Anemia of chronic disease - Likely due to combination of malignancy and CKD - Hemoglobin is stable - No current indications for transfusion     Leukopenia due to antineoplastic chemotherapy / Thrombocytopenia / Pancytopenia due to antineoplastic chemotherapy - Pancytopenia due to chemotherapy (last infusion received 09/03/2015) - Monitor CBC daily     Type 2 diabetes mellitus with renal manifestations - Will continue glipizide - Continue Levemir 50 units BID (he is on higher insulin dose at home but since he is not eating as good we have reduced the dose for the admission). Adjust as needed based on CBG's. - Added SSI    Hypothyroidism - Continue synthroid - Check TSH    Gout - Continue allopurinol   DVT prophylaxis:  - Lovenox subQ ordered   Radiological Exams on Admission: Dg Chest Port 1 View 09/10/2015  Mild left lower lobe atelectasis.   Electronically Signed   By: Franchot Gallo M.D.   On: 09/10/2015 14:00    EKG: I have personally reviewed EKG. EKG shows atrial fibrillation   Code Status: Full Family Communication: Plan of care discussed with the patient  Disposition Plan: Admit for further evaluation, stepdown unit since he requires cardizem drip   Leisa Lenz, MD  [facility] Pager [phone]  Time spent in minutes: 75 minutes  Review of Systems:  Constitutional: Negative for fever, chills and malaise/fatigue. Negative for diaphoresis.  HENT: Negative for hearing loss, ear pain, nosebleeds, congestion, sore throat, neck pain, tinnitus and ear discharge.   Eyes: Negative for blurred vision, double vision, photophobia,  pain, discharge and redness.  Respiratory: Negative for cough, hemoptysis, sputum production, shortness of breath, wheezing and stridor.   Cardiovascular: Negative for chest pain, palpitations, orthopnea, claudication and leg swelling.  Gastrointestinal: Negative for nausea, vomiting and abdominal pain. Negative for heartburn, constipation, blood in stool and melena.  Genitourinary: Negative for dysuria, urgency, frequency, hematuria and flank pain.  Musculoskeletal: Negative for myalgias, back pain, joint pain and falls.  Skin: Negative for itching and rash.  Neurological: positive for dizziness and weakness. Negative for tingling, tremors, sensory change, speech change, focal weakness, loss of consciousness and positive for headaches.  Endo/Heme/Allergies: Negative for environmental allergies and polydipsia. Does not bruise/bleed easily.  Psychiatric/Behavioral: Negative for suicidal ideas. The patient is not nervous/anxious.      Past Medical History  Diagnosis Date  . Hyperlipidemia   . Hypertension   . Coronary heart disease     a. s/p CABG x 3 (VG->PDA, VG->PL, LIMA->LAD);  b. 2013 Abnl Myoview;  c. 2013 Cath: 3/3 patent grafts, occluded LCX which correlated w/ ischemia on MV->not amenable to PCI->Med Rx.  . Obesity   . CHF (congestive heart failure)   . VT (ventricular tachycardia)   .  SVT (supraventricular tachycardia)   . PAD (peripheral artery disease)     Angiography in February of 2014: Moderate left iliac artery stenosis, mild to moderate diffuse right SFA disease. Severe proximal right popliteal artery stenosis with three-vessel runoff below the knee. Status post self-expanding stent placement to the proximal popliteal artery  . Implantable cardioverter-defibrillator Mdt   . Asthma     "small touch" (07/29/2013)  . History of pneumonia     "3-4 times; last time ~ 2003" (07/29/2013)  . OSA on CPAP   . GERD (gastroesophageal reflux disease)   . H/O hiatal hernia   . Gout    . Anxiety   . Allergy   . Stomach cancer 07/23/2015    invasive adenocarcinoma ,esophagus ,distal   . Type II diabetes mellitus     on insulin  . Sleep apnea   . Hyperlipidemia   . PAD (peripheral artery disease)   . CHF (congestive heart failure)    Past Surgical History  Procedure Laterality Date  . Tonsillectomy    . Cardiac catheterization    . Wrist fracture surgery Right   . Cataract extraction w/ intraocular lens implant Left   . Cardiac defibrillator placement  1997; ~ 2003  . Coronary artery bypass graft  1997; 2007    "X3" (07/29/2013)  . Nerve, tendon and artery repair Left 09/20/2013    Procedure: IRRIGATION AND DEBRIDEMENT,Exploration and Repair of Ulnar/Digital  Artery Nerve of Left Index finger;  Surgeon: Roseanne Kaufman, MD;  Location: [facility];  Service: Orthopedics;  Laterality: Left;  . Abdominal aortagram N/A 01/30/2013    Procedure: ABDOMINAL Maxcine Ham;  Surgeon: Wellington Hampshire, MD;  Location: [facility] CATH LAB;  Service: Cardiovascular;  Laterality: N/A;  . Cardiac catheterization N/A 07/22/2015    Procedure: Left Heart Cath and Coronary Angiography;  Surgeon: Peter M Martinique, MD;  Location: [facility] CV LAB;  Service: Cardiovascular;  Laterality: N/A;  . Esophagogastroduodenoscopy N/A 07/23/2015    Procedure: ESOPHAGOGASTRODUODENOSCOPY (EGD);  Surgeon: Wonda Horner, MD;  Location: [facility] ENDOSCOPY;  Service: Endoscopy;  Laterality: N/A;   Social History:  reports that he quit smoking about 19 years ago. His smoking use included Cigarettes. He has a 60 pack-year smoking history. He has never used smokeless tobacco. He reports that he does not drink alcohol or use illicit drugs.  Allergies  Allergen Reactions  . Adhesive [Tape]     Pulls skin off  . Morphine And Related Nausea And Vomiting    Family History:  Family History  Problem Relation Age of Onset  . Heart disease Brother   . Cancer Brother   . Breast cancer Mother      Prior to Admission medications    Medication Sig Start Date End Date Taking? Authorizing Provider  acetaminophen (TYLENOL) 500 MG tablet Take 1,000 mg by mouth every 6 (six) hours as needed for moderate pain or headache.   Yes Historical Provider, MD  allopurinol (ZYLOPRIM) 300 MG tablet Take 300 mg by mouth daily.     Yes Historical Provider, MD  aspirin 81 MG tablet Take 1 tablet (81 mg total) by mouth daily. 07/23/15  Yes Burgess Estelle, MD  atorvastatin (LIPITOR) 40 MG tablet Take 1 tablet (40 mg total) by mouth daily. 03/11/15  Yes Deboraha Sprang, MD  dexamethasone (DECADRON) 4 MG tablet Take 10 mg at 10pm..... night before chemotherapy 08/06/15  Yes Ladell Pier, MD  glipiZIDE (GLUCOTROL) 10 MG tablet Take 10 mg by mouth 2 (two)  times daily before a meal.  09/18/12  Yes Historical Provider, MD  hyaluronate sodium (RADIAPLEXRX) GEL Apply 1 application topically as needed.   Yes Historical Provider, MD  HYDROcodone-acetaminophen (NORCO/VICODIN) 5-325 MG per tablet TAKE 1 TABLET BY MOUTH 4 TIMES DAILY AS NEEDED FOR PAIN 09/03/15  Yes Owens Shark, NP  insulin detemir (LEVEMIR) 100 UNIT/ML injection Inject 85 Units into the skin 2 (two) times daily.    Yes Historical Provider, MD  levothyroxine (SYNTHROID, LEVOTHROID) 75 MCG tablet Take 75 mcg by mouth daily before breakfast.  12/06/14  Yes Historical Provider, MD  Liraglutide (VICTOZA) 18 MG/3ML SOPN Inject 1.8 mg into the skin daily.   Yes Historical Provider, MD  metoprolol succinate (TOPROL-XL) 100 MG 24 hr tablet Take 1 tablet (100 mg total) by mouth daily. Take with or immediately following a meal. 02/16/15  Yes Peter M Martinique, MD  nitroGLYCERIN (NITROSTAT) 0.4 MG SL tablet Place 1 tablet (0.4 mg total) under the tongue every 5 (five) minutes x 3 doses as needed for chest pain. 10/03/14  Yes Peter M Martinique, MD  NOVOLOG FLEXPEN 100 UNIT/ML FlexPen Inject 20 Units as directed daily.  04/05/15  Yes Historical Provider, MD  prochlorperazine (COMPAZINE) 10 MG tablet Take 1 tablet (10  mg total) by mouth every 6 (six) hours as needed for nausea or vomiting. 09/03/15  Yes Owens Shark, NP  ranitidine (ZANTAC) 300 MG tablet Take 1 tablet (300 mg total) by mouth daily as needed for heartburn. 02/16/15  Yes Peter M Martinique, MD  sucralfate (CARAFATE) 1 G tablet Take 1 tablet (1 g total) by mouth 4 (four) times daily. 08/21/15  Yes Kyung Rudd, MD  traMADol (ULTRAM) 50 MG tablet Take 1 tablet (50 mg total) by mouth every 6 (six) hours as needed for moderate pain. 07/30/15  Yes Ladell Pier, MD  valsartan (DIOVAN) 160 MG tablet Take 1 tablet (160 mg total) by mouth daily. 02/25/15  Yes Deboraha Sprang, MD   Physical Exam: Filed Vitals:   09/10/15 1600 09/10/15 1630 09/10/15 1700 09/10/15 1730  BP: 136/66 136/90 117/70 118/64  Pulse:   114 118  Temp:      TempSrc:      Resp: 20 15 22 23   Height:      Weight:      SpO2:   100% 100%    Physical Exam  Constitutional: Appears ill, mild distress HENT: Normocephalic. No tonsillar erythema or exudates Eyes: Conjunctivae are normal. No scleral icterus.  Neck: Normal ROM. Neck supple. No tracheal deviation. No thyromegaly.  CVS: irregular rhythm, tachcyardic, S1/S2 appreciated  Pulmonary: diminished breath sounds, no wheezing   Abdominal: Soft. BS +,  no distension, tenderness, rebound or guarding.  Musculoskeletal: Normal range of motion. No edema and no tenderness.  Lymphadenopathy: No lymphadenopathy noted, cervical, inguinal. Neuro: Alert. Normal reflexes, muscle tone coordination. No focal neurologic deficits. Skin: Skin is warm and dry. No rash noted.  No erythema. No pallor.  Psychiatric: Normal mood and affect. Behavior, judgment, thought content normal.   Labs on Admission:  Basic Metabolic Panel:  Recent Labs Lab 09/09/15 1025 09/10/15 1336  NA 137 134*  K 4.4 3.5  CL  --  105  CO2 26 24  GLUCOSE 144* 142*  BUN 11.2 16  CREATININE 1.1 1.29*  CALCIUM 9.6 8.9   Liver Function Tests:  Recent Labs Lab  09/09/15 1025 09/10/15 1336  AST 20 26  ALT <9 10*  ALKPHOS 109 93  BILITOT  1.80* 1.9*  PROT 6.7 6.8  ALBUMIN 3.4* 3.5   No results for input(s): LIPASE, AMYLASE in the last 168 hours. No results for input(s): AMMONIA in the last 168 hours. CBC:  Recent Labs Lab 09/09/15 1025 09/10/15 1336  WBC 3.0* 3.9*  NEUTROABS 2.3 2.9  HGB 12.6* 12.3*  HCT 36.0* 35.9*  MCV 82.0 82.5  PLT 68* 100*   Cardiac Enzymes: No results for input(s): CKTOTAL, CKMB, CKMBINDEX, TROPONINI in the last 168 hours. BNP: Invalid input(s): POCBNP CBG: No results for input(s): GLUCAP in the last 168 hours.  If 7PM-7AM, please contact night-coverage [email or web address] Password [facility] 09/10/2015, 6:07 PM

## 2015-09-10 NOTE — ED Notes (Signed)
I gave I stat CG4 results to MD Eulis Foster

## 2015-09-10 NOTE — Progress Notes (Signed)
ANTIBIOTIC CONSULT NOTE - INITIAL  Pharmacy Consult for vancomycin, Zosyn Indication: rule out sepsis  Allergies  Allergen Reactions  . Adhesive [Tape]     Pulls skin off  . Morphine And Related Nausea And Vomiting    Patient Measurements: Height: 6' (182.9 cm) Weight: 224 lb (101.606 kg) IBW/kg (Calculated) : 77.6  Vital Signs: Temp: 97.4 F (36.3 C) (09/29 1307) Temp Source: Oral (09/29 1307) BP: 100/74 mmHg (09/29 1848) Pulse Rate: 120 (09/29 1848) Intake/Output from previous day:   Intake/Output from this shift:    Labs:  Recent Labs  09/09/15 1025 09/09/15 1025 09/10/15 1336  WBC 3.0*  --  3.9*  HGB 12.6*  --  12.3*  PLT 68*  --  100*  CREATININE  --  1.1 1.29*   Estimated Creatinine Clearance: 69.5 mL/min (by C-G formula based on Cr of 1.29). No results for input(s): VANCOTROUGH, VANCOPEAK, VANCORANDOM, GENTTROUGH, GENTPEAK, GENTRANDOM, TOBRATROUGH, TOBRAPEAK, TOBRARND, AMIKACINPEAK, AMIKACINTROU, AMIKACIN in the last 72 hours.   Microbiology: No results found for this or any previous visit (from the past 720 hour(s)).  Medical History: Past Medical History  Diagnosis Date  . Hyperlipidemia   . Hypertension   . Coronary heart disease     a. s/p CABG x 3 (VG->PDA, VG->PL, LIMA->LAD);  b. 2013 Abnl Myoview;  c. 2013 Cath: 3/3 patent grafts, occluded LCX which correlated w/ ischemia on MV->not amenable to PCI->Med Rx.  . Obesity   . CHF (congestive heart failure)   . VT (ventricular tachycardia)   . SVT (supraventricular tachycardia)   . PAD (peripheral artery disease)     Angiography in February of 2014: Moderate left iliac artery stenosis, mild to moderate diffuse right SFA disease. Severe proximal right popliteal artery stenosis with three-vessel runoff below the knee. Status post self-expanding stent placement to the proximal popliteal artery  . Implantable cardioverter-defibrillator Mdt   . Asthma     "small touch" (07/29/2013)  . History of  pneumonia     "3-4 times; last time ~ 2003" (07/29/2013)  . OSA on CPAP   . GERD (gastroesophageal reflux disease)   . H/O hiatal hernia   . Gout   . Anxiety   . Allergy   . Stomach cancer 07/23/2015    invasive adenocarcinoma ,esophagus ,distal   . Type II diabetes mellitus     on insulin  . Sleep apnea   . Hyperlipidemia   . PAD (peripheral artery disease)   . CHF (congestive heart failure)     Medications:  Scheduled:  . [START ON 09/11/2015] allopurinol  300 mg Oral Daily  . [START ON 09/11/2015] aspirin EC  81 mg Oral Daily  . [START ON 09/11/2015] atorvastatin  40 mg Oral q1800  . enoxaparin (LOVENOX) injection  40 mg Subcutaneous Q24H  . famotidine  20 mg Oral Daily  . [START ON 09/11/2015] glipiZIDE  10 mg Oral BID AC  . [START ON 09/11/2015] insulin aspart  0-15 Units Subcutaneous TID WC  . insulin aspart  0-5 Units Subcutaneous QHS  . insulin detemir  50 Units Subcutaneous BID  . ipratropium  0.5 mg Nebulization Q4H  . levalbuterol  1.25 mg Nebulization Q4H  . [START ON 09/11/2015] levothyroxine  75 mcg Oral QAC breakfast  . Liraglutide  1.8 mg Subcutaneous Daily  . sodium chloride  3 mL Intravenous Q12H  . sucralfate  1 g Oral QID   Infusions:  . sodium chloride    . piperacillin-tazobactam     Assessment:  69 yo presented to ER with CC dizziness. PMH inclues CHF, adenocarcinoma of lung currently being treated with chemotherapy, DM, gout and HTN. To start vancomycin and Zosyn for possible sepsis. On admission, WBC low, SCr elevated with CrCl N 54, and afebrile  Goal of Therapy:  Vancomycin trough level 15-20 mcg/ml  Plan:  1) Vancomycin 2g IV x 1 then 1250mg  IV q24 based on current weight and renal function 2) Zosyn 3.375g IV q8 (extended interval infusion) for CrCl > 20 ml/min   Adrian Saran, PharmD, BCPS Pager [phone] 09/10/2015 7:50 PM

## 2015-09-10 NOTE — ED Notes (Signed)
Bed: WA01 Expected date:  Expected time:  Means of arrival:  Comments: 60f abd pain/cancer pt

## 2015-09-10 NOTE — Progress Notes (Signed)
1250-Late Entry: Received walk-in slip from Ms. Wilma from pt with a c/o "spliting headache, dizziness, feeling like passing out".  Selena Lesser, NP in meeting and Dr. Benay Spice, Ned Card NP seeing pts.  Upon assessing pt in lobby, pt reports he feels like he is about to pass out, seeing "spots" and is dizzy, has a "terrible headache". Wife states radiation was scheduled today but she canceled it. Chemo was scheduled yesterday but was held until next week due to low counts. Pt reports feeling this way since 7am.  This RN made decision to take pt to ED immediately for evaluation since patient constantly said he was about to pass out.  Pt taken via wheelchair with wife to ED registration.  Received call from Joelyn Oms for report; since pt was not scheduled for any appts with [place] today, only report given was pt's account of his symptoms.

## 2015-09-10 NOTE — ED Notes (Signed)
Author spoke with Ann Lions, RN from

## 2015-09-10 NOTE — ED Notes (Signed)
Attempted to call report, receiving RN not available.

## 2015-09-10 NOTE — ED Notes (Signed)
Author talked to Ann Lions, RN in reference to patient status prior to arrival to emergency department. The patient was a walk in for symptom management and was never seen because the provider was not there. He began to explain his symptoms and she assisted him to our department for evaluation. The patient was not in her care prior to arrival.

## 2015-09-10 NOTE — ED Provider Notes (Signed)
CSN: [id number]     Arrival date & time 09/10/15  1254 History   First MD Initiated Contact with Patient 09/10/15 1304     Chief Complaint  Patient presents with  . Dizziness     (Consider location/radiation/quality/duration/timing/severity/associated sxs/prior Treatment) The history is provided by the patient.      Walter French is a 69 y.o. male who presents for evaluation of weakness, and decreased appetite for several days. He presented today to the oncology section, and was instructed to come here for evaluation. He was told recently that his "blood count is low." He has a decreased appetite for 1 month. He states that anytime he eats food or drinks liquids. It causes burning in his upper abdomen, and he vomits. He has had a few scattered episodes of vomiting. No diarrhea. No headache, chest pain, shortness of breath or cough. No change in urinary habits. He has had urinary frequency without dysuria. There are no other known modifying factors.   Past Medical History  Diagnosis Date  . Hyperlipidemia   . Hypertension   . Coronary heart disease     a. s/p CABG x 3 (VG->PDA, VG->PL, LIMA->LAD);  b. 2013 Abnl Myoview;  c. 2013 Cath: 3/3 patent grafts, occluded LCX which correlated w/ ischemia on MV->not amenable to PCI->Med Rx.  . Obesity   . CHF (congestive heart failure)   . VT (ventricular tachycardia)   . SVT (supraventricular tachycardia)   . PAD (peripheral artery disease)     Angiography in February of 2014: Moderate left iliac artery stenosis, mild to moderate diffuse right SFA disease. Severe proximal right popliteal artery stenosis with three-vessel runoff below the knee. Status post self-expanding stent placement to the proximal popliteal artery  . Implantable cardioverter-defibrillator Mdt   . Asthma     "small touch" (07/29/2013)  . History of pneumonia     "3-4 times; last time ~ 2003" (07/29/2013)  . OSA on CPAP   . GERD (gastroesophageal reflux disease)   . H/O  hiatal hernia   . Gout   . Anxiety   . Allergy   . Stomach cancer 07/23/2015    invasive adenocarcinoma ,esophagus ,distal   . Type II diabetes mellitus     on insulin  . Sleep apnea   . Hyperlipidemia   . PAD (peripheral artery disease)   . CHF (congestive heart failure)    Past Surgical History  Procedure Laterality Date  . Tonsillectomy    . Cardiac catheterization    . Wrist fracture surgery Right   . Cataract extraction w/ intraocular lens implant Left   . Cardiac defibrillator placement  1997; ~ 2003  . Coronary artery bypass graft  1997; 2007    "X3" (07/29/2013)  . Nerve, tendon and artery repair Left 09/20/2013    Procedure: IRRIGATION AND DEBRIDEMENT,Exploration and Repair of Ulnar/Digital  Artery Nerve of Left Index finger;  Surgeon: Roseanne Kaufman, MD;  Location: [facility];  Service: Orthopedics;  Laterality: Left;  . Abdominal aortagram N/A 01/30/2013    Procedure: ABDOMINAL Maxcine Ham;  Surgeon: Wellington Hampshire, MD;  Location: [facility] CATH LAB;  Service: Cardiovascular;  Laterality: N/A;  . Cardiac catheterization N/A 07/22/2015    Procedure: Left Heart Cath and Coronary Angiography;  Surgeon: Peter M Martinique, MD;  Location: [place] CV LAB;  Service: Cardiovascular;  Laterality: N/A;  . Esophagogastroduodenoscopy N/A 07/23/2015    Procedure: ESOPHAGOGASTRODUODENOSCOPY (EGD);  Surgeon: Wonda Horner, MD;  Location: [facility] ENDOSCOPY;  Service: Endoscopy;  Laterality: N/A;   Family History  Problem Relation Age of Onset  . Heart disease Brother   . Cancer Brother   . Breast cancer Mother    Social History  Substance Use Topics  . Smoking status: Former Smoker -- 2.00 packs/day for 30 years    Types: Cigarettes    Quit date: 12/13/1995  . Smokeless tobacco: Never Used  . Alcohol Use: No     Comment: 07/29/2013 "not touched any alcohol since 1997"    Review of Systems  All other systems reviewed and are negative.     Allergies  Adhesive and Morphine and related  Home  Medications   Prior to Admission medications   Medication Sig Start Date End Date Taking? Authorizing Provider  acetaminophen (TYLENOL) 500 MG tablet Take 1,000 mg by mouth every 6 (six) hours as needed for moderate pain or headache.   Yes Historical Provider, MD  allopurinol (ZYLOPRIM) 300 MG tablet Take 300 mg by mouth daily.     Yes Historical Provider, MD  aspirin 81 MG tablet Take 1 tablet (81 mg total) by mouth daily. 07/23/15  Yes Burgess Estelle, MD  atorvastatin (LIPITOR) 40 MG tablet Take 1 tablet (40 mg total) by mouth daily. 03/11/15  Yes Deboraha Sprang, MD  dexamethasone (DECADRON) 4 MG tablet Take 10 mg at 10pm..... night before chemotherapy 08/06/15  Yes Ladell Pier, MD  glipiZIDE (GLUCOTROL) 10 MG tablet Take 10 mg by mouth 2 (two) times daily before a meal.  09/18/12  Yes Historical Provider, MD  hyaluronate sodium (RADIAPLEXRX) GEL Apply 1 application topically as needed.   Yes Historical Provider, MD  HYDROcodone-acetaminophen (NORCO/VICODIN) 5-325 MG per tablet TAKE 1 TABLET BY MOUTH 4 TIMES DAILY AS NEEDED FOR PAIN 09/03/15  Yes Owens Shark, NP  insulin detemir (LEVEMIR) 100 UNIT/ML injection Inject 85 Units into the skin 2 (two) times daily.    Yes Historical Provider, MD  levothyroxine (SYNTHROID, LEVOTHROID) 75 MCG tablet Take 75 mcg by mouth daily before breakfast.  12/06/14  Yes Historical Provider, MD  Liraglutide (VICTOZA) 18 MG/3ML SOPN Inject 1.8 mg into the skin daily.   Yes Historical Provider, MD  metoprolol succinate (TOPROL-XL) 100 MG 24 hr tablet Take 1 tablet (100 mg total) by mouth daily. Take with or immediately following a meal. 02/16/15  Yes Peter M Martinique, MD  nitroGLYCERIN (NITROSTAT) 0.4 MG SL tablet Place 1 tablet (0.4 mg total) under the tongue every 5 (five) minutes x 3 doses as needed for chest pain. 10/03/14  Yes Peter M Martinique, MD  NOVOLOG FLEXPEN 100 UNIT/ML FlexPen Inject 20 Units as directed daily.  04/05/15  Yes Historical Provider, MD  [facility]   Yes Historical Provider, MD  prochlorperazine (COMPAZINE) 10 MG tablet Take 1 tablet (10 mg total) by mouth every 6 (six) hours as needed for nausea or vomiting. 09/03/15  Yes Owens Shark, NP  ranitidine (ZANTAC) 300 MG tablet Take 1 tablet (300 mg total) by mouth daily as needed for heartburn. 02/16/15  Yes Peter M Martinique, MD  sucralfate (CARAFATE) 1 G tablet Take 1 tablet (1 g total) by mouth 4 (four) times daily. 08/21/15  Yes Kyung Rudd, MD  traMADol (ULTRAM) 50 MG tablet Take 1 tablet (50 mg total) by mouth every 6 (six) hours as needed for moderate pain. 07/30/15  Yes Ladell Pier, MD  valsartan (DIOVAN) 160 MG tablet Take 1 tablet (160 mg total) by mouth daily. 02/25/15  Yes Deboraha Sprang, MD  metroNIDAZOLE (METROGEL) 0.75 % gel Apply topically 2 (two) times daily. Patient not taking: Reported on 09/10/2015 07/23/15   Burgess Estelle, MD   BP 110/61 mmHg  Pulse 125  Temp(Src) 97.4 F (36.3 C) (Oral)  Resp 18  Ht 6' (1.829 m)  Wt 224 lb (101.606 kg)  BMI 30.37 kg/m2  SpO2 96% Physical Exam  Constitutional: He is oriented to person, place, and time. He appears well-developed and well-nourished. No distress.  HENT:  Head: Normocephalic and atraumatic.  Right Ear: External ear normal.  Left Ear: External ear normal.  Eyes: Conjunctivae and EOM are normal. Pupils are equal, round, and reactive to light.  Neck: Normal range of motion and phonation normal. Neck supple.  Cardiovascular: Regular rhythm and normal heart sounds.   Tachycardia  Pulmonary/Chest: Effort normal and breath sounds normal. He exhibits no bony tenderness.  Abdominal: Soft. There is no tenderness.  Musculoskeletal: Normal range of motion.  Neurological: He is alert and oriented to person, place, and time. No cranial nerve deficit or sensory deficit. He exhibits normal muscle tone. Coordination normal.  Skin: Skin is warm, dry and intact.  Psychiatric: He has a normal mood and affect. His  behavior is normal. Judgment and thought content normal.  Nursing note and vitals reviewed.   ED Course  Procedures (including critical care time) Medications  sodium chloride 0.9 % bolus 1,000 mL (1,000 mLs Intravenous New Bag/Given 09/10/15 1502)    Followed by  sodium chloride 0.9 % bolus 500 mL (not administered)  cefTRIAXone (ROCEPHIN) 1 g in dextrose 5 % 50 mL IVPB (1 g Intravenous New Bag/Given 09/10/15 1539)  diltiazem (CARDIZEM) 100 mg in dextrose 5 % 100 mL (1 mg/mL) infusion (not administered)  sodium chloride 0.9 % bolus 3,048 mL (3,048 mLs Intravenous New Bag/Given 09/10/15 1503)  sucralfate (CARAFATE) tablet 1 g (1 g Oral Given 09/10/15 1539)    Patient Vitals for the past 24 hrs:  BP Temp Temp src Pulse Resp SpO2 Height Weight  09/10/15 1427 110/61 mmHg - - (!) 125 18 96 % - -  09/10/15 1416 110/61 mmHg - - 68 19 (!) 79 % - -  09/10/15 1337 - - - - - - 6' (1.829 m) 224 lb (101.606 kg)  09/10/15 1307 115/84 mmHg 97.4 F (36.3 C) Oral 80 23 100 % - -    3:10 PM Reevaluation with update and discussion. After initial assessment and treatment, an updated evaluation reveals no change in clinical status. He remains tachycardic. Carafate ordered. Rocephin ordered for possibility of UTI, noted chest x-ray negative for infiltrate. WENTZ,ELLIOTT L   15:50- heart rate, somewhat improved to 125 with IV fluids, however, he appears to be still in atrial fibrillation. This is a new process for him. We'll give additional control with Cardizem IV, and arrange admission.  Defibrillator interrogation reveals atrial fibrillation, multiple episodes of nonsustained ventricular tachycardia, and 2 episodes of SVT. No episodes of defibrillation. Timing of episodes are not specified.  CHADvasc- 5 points; stroke risk related and 7.2%, per year  Consult cardiology- Will see as consultant  4:35 PM-Consult complete with Hospitalist. Patient case explained and discussed. She agrees to admit patient for  further evaluation and treatment. Call ended at [place] Performed by: Richarda Blade Total critical care time: 45 min Critical care time was exclusive of separately billable procedures and treating other patients. Critical care was necessary to treat or prevent imminent or life-threatening deterioration. Critical care  was time spent personally by me on the following activities: development of treatment plan with patient and/or surrogate as well as nursing, discussions with consultants, evaluation of patient's response to treatment, examination of patient, obtaining history from patient or surrogate, ordering and performing treatments and interventions, ordering and review of laboratory studies, ordering and review of radiographic studies, pulse oximetry and re-evaluation of patient's condition.     Labs Review Labs Reviewed  COMPREHENSIVE METABOLIC PANEL - Abnormal; Notable for the following:    Sodium 134 (*)    Glucose, Bld 142 (*)    Creatinine, Ser 1.29 (*)    ALT 10 (*)    Total Bilirubin 1.9 (*)    GFR calc non Af Amer 56 (*)    All other components within normal limits  CBC WITH DIFFERENTIAL/PLATELET - Abnormal; Notable for the following:    WBC 3.9 (*)    Hemoglobin 12.3 (*)    HCT 35.9 (*)    Platelets 100 (*)    Lymphs Abs 0.3 (*)    All other components within normal limits  I-STAT CG4 LACTIC ACID, ED - Abnormal; Notable for the following:    Lactic Acid, Venous 2.18 (*)    All other components within normal limits  CULTURE, BLOOD (ROUTINE X 2)  CULTURE, BLOOD (ROUTINE X 2)  URINE CULTURE  URINALYSIS, ROUTINE W REFLEX MICROSCOPIC (NOT AT [facility])  I-STAT CG4 LACTIC ACID, ED    Imaging Review Dg Chest Port 1 View  09/10/2015   CLINICAL DATA:  Dizziness. Currently receiving treatment for esophageal carcinoma  EXAM: PORTABLE CHEST 1 VIEW  COMPARISON:  07/20/2015  FINDINGS: Heart size upper normal. Postop CABG. AICD unchanged in position. Negative for heart  failure  Mild left lower lobe atelectasis has progressed in the interval. Negative for pneumonia or effusion. No mass or lung nodule.  IMPRESSION: Mild left lower lobe atelectasis.   Electronically Signed   By: Franchot Gallo M.D.   On: 09/10/2015 14:00   I have personally reviewed and evaluated these images and lab results as part of my medical decision-making.   EKG Interpretation   Date/Time:  Thursday September 10 2015 13:10:11 EDT Ventricular Rate:  156 PR Interval:    QRS Duration: 98 QT Interval:  312 QTC Calculation: 503 R Axis:   9 Text Interpretation:  Atrial fibrillation with rapid V-rate Anterior  infarct, old Borderline T abnormalities, inferior leads Since last tracing  Atrial fibrillation with rapid ventricular response is new Confirmed by  Eulis Foster  MD, ELLIOTT ([id number]) on 09/10/2015 3:54:05 PM      MDM   Final diagnoses:  Atrial fibrillation with RVR  Esophageal cancer   Atrial fibrillation, new onset with high risk profile, requiring admission and anticoagulation. Cardiac catheterization 1 month ago, which showed stable coronary anatomy, post CABG. , No malignant arrhythmias noted on defibrillator interrogation. Doubt ACS. The patient is not having chest pain.  Nursing Notes Reviewed/ Care Coordinated, and agree without changes. Applicable Imaging Reviewed.  Interpretation of Laboratory Data incorporated into ED treatment  Plan: Admit   Daleen Bo, MD 09/14/15 [phone]

## 2015-09-11 ENCOUNTER — Telehealth: Payer: Self-pay | Admitting: *Deleted

## 2015-09-11 ENCOUNTER — Encounter (HOSPITAL_COMMUNITY): Payer: Self-pay

## 2015-09-11 ENCOUNTER — Ambulatory Visit
Admission: RE | Admit: 2015-09-11 | Discharge: 2015-09-11 | Disposition: A | Discharge status: Home / Self Care | Payer: Medicare Other | Source: Ambulatory Visit | Attending: Radiation Oncology | Admitting: Radiation Oncology

## 2015-09-11 ENCOUNTER — Encounter: Payer: Self-pay | Admitting: Radiation Oncology

## 2015-09-11 DIAGNOSIS — I4891 Unspecified atrial fibrillation: Secondary | ICD-10-CM

## 2015-09-11 DIAGNOSIS — R778 Other specified abnormalities of plasma proteins: Secondary | ICD-10-CM | POA: Diagnosis present

## 2015-09-11 DIAGNOSIS — D61810 Antineoplastic chemotherapy induced pancytopenia: Secondary | ICD-10-CM

## 2015-09-11 DIAGNOSIS — R7989 Other specified abnormal findings of blood chemistry: Secondary | ICD-10-CM

## 2015-09-11 DIAGNOSIS — E038 Other specified hypothyroidism: Secondary | ICD-10-CM

## 2015-09-11 DIAGNOSIS — C160 Malignant neoplasm of cardia: Secondary | ICD-10-CM

## 2015-09-11 DIAGNOSIS — Z95810 Presence of automatic (implantable) cardiac defibrillator: Secondary | ICD-10-CM

## 2015-09-11 LAB — URINALYSIS, ROUTINE W REFLEX MICROSCOPIC
GLUCOSE, UA: NEGATIVE mg/dL
HGB URINE DIPSTICK: NEGATIVE
Ketones, ur: NEGATIVE mg/dL
LEUKOCYTES UA: NEGATIVE
Nitrite: NEGATIVE
PH: 5 (ref 5.0–8.0)
Protein, ur: 30 mg/dL — AB
Urobilinogen, UA: 1 mg/dL (ref 0.0–1.0)

## 2015-09-11 LAB — GLUCOSE, CAPILLARY
GLUCOSE-CAPILLARY: 117 mg/dL — AB (ref 65–99)
GLUCOSE-CAPILLARY: 145 mg/dL — AB (ref 65–99)
GLUCOSE-CAPILLARY: 141 mg/dL — AB (ref 65–99)
Glucose-Capillary: 118 mg/dL — ABNORMAL HIGH (ref 65–99)
Glucose-Capillary: 137 mg/dL — ABNORMAL HIGH (ref 65–99)

## 2015-09-11 LAB — URINE MICROSCOPIC-ADD ON

## 2015-09-11 LAB — COMPREHENSIVE METABOLIC PANEL
ALT: 8 U/L — ABNORMAL LOW (ref 17–63)
ANION GAP: 6 (ref 5–15)
AST: 19 U/L (ref 15–41)
Albumin: 2.9 g/dL — ABNORMAL LOW (ref 3.5–5.0)
Alkaline Phosphatase: 73 U/L (ref 38–126)
BUN: 14 mg/dL (ref 6–20)
CALCIUM: 8.5 mg/dL — AB (ref 8.9–10.3)
CHLORIDE: 111 mmol/L (ref 101–111)
CO2: 22 mmol/L (ref 22–32)
Creatinine, Ser: 0.82 mg/dL (ref 0.61–1.24)
GFR calc non Af Amer: >60 mL/min (ref >60)
Glucose, Bld: 116 mg/dL — ABNORMAL HIGH (ref 65–99)
POTASSIUM: 3.6 mmol/L (ref 3.5–5.1)
SODIUM: 139 mmol/L (ref 135–145)
Total Bilirubin: 1.3 mg/dL — ABNORMAL HIGH (ref 0.3–1.2)
Total Protein: 5.7 g/dL — ABNORMAL LOW (ref 6.5–8.1)

## 2015-09-11 LAB — CBC
HCT: 29.5 % — ABNORMAL LOW (ref 39.0–52.0)
HEMOGLOBIN: 10.1 g/dL — AB (ref 13.0–17.0)
MCH: 28.5 pg (ref 26.0–34.0)
MCHC: 34.2 g/dL (ref 30.0–36.0)
MCV: 83.3 fL (ref 78.0–100.0)
Platelets: 63 10*3/uL — ABNORMAL LOW (ref 150–400)
RBC: 3.54 MIL/uL — AB (ref 4.22–5.81)
RDW: 15.2 % (ref 11.5–15.5)
WBC: 2.3 10*3/uL — ABNORMAL LOW (ref 4.0–10.5)

## 2015-09-11 LAB — TROPONIN I
TROPONIN I: 0.05 ng/mL — AB (ref <0.031)
TROPONIN I: 0.04 ng/mL — AB (ref <0.031)

## 2015-09-11 MED ORDER — ZOLPIDEM TARTRATE 5 MG PO TABS
5.0000 mg | ORAL_TABLET | Freq: Every evening | ORAL | Status: DC | PRN
  Administered 2015-09-11 – 2015-09-14 (×5): 5 mg via ORAL
  Filled 2015-09-11 (×5): qty 1

## 2015-09-11 MED ORDER — FENTANYL CITRATE (PF) 100 MCG/2ML IJ SOLN
50.0000 ug | INTRAMUSCULAR | Status: DC | PRN
  Administered 2015-09-11 – 2015-09-13 (×9): 50 ug via INTRAVENOUS
  Filled 2015-09-11 (×9): qty 2

## 2015-09-11 MED ORDER — VANCOMYCIN HCL IN DEXTROSE 750-5 MG/150ML-% IV SOLN
750.0000 mg | Freq: Two times a day (BID) | INTRAVENOUS | Status: DC
  Administered 2015-09-11 – 2015-09-12 (×2): 750 mg via INTRAVENOUS
  Filled 2015-09-11 (×3): qty 150

## 2015-09-11 MED ORDER — DILTIAZEM HCL 100 MG IV SOLR: 5.0000 mg/h | Freq: Once | INTRAVENOUS | Status: DC

## 2015-09-11 MED ORDER — METOPROLOL TARTRATE 25 MG PO TABS
25.0000 mg | ORAL_TABLET | Freq: Four times a day (QID) | ORAL | Status: DC
  Administered 2015-09-11 – 2015-09-12 (×4): 25 mg via ORAL
  Filled 2015-09-11 (×5): qty 1

## 2015-09-11 MED ORDER — INFLUENZA VAC SPLIT QUAD 0.5 ML IM SUSY
0.5000 mL | PREFILLED_SYRINGE | INTRAMUSCULAR | Status: AC
  Administered 2015-09-12: 0.5 mL via INTRAMUSCULAR
  Filled 2015-09-11 (×2): qty 0.5

## 2015-09-11 MED ORDER — DILTIAZEM HCL 100 MG IV SOLR
5.0000 mg/h | INTRAVENOUS | Status: DC
  Administered 2015-09-11 (×2): 11.5 mg/h via INTRAVENOUS
  Filled 2015-09-11 (×2): qty 100

## 2015-09-11 NOTE — Care Management Note (Signed)
Case Management Note  Patient Details  Name: Walter French MRN: [id number] Date of Birth: 1946/09/11  Subjective/Objective:          A.fib and hypoxia          Action/Plan: home when stable will follow for needs   Expected Discharge Date:   (UNKNOWN)               Expected Discharge Plan:  Home/Self Care  In-House Referral:  NA  Discharge planning Services  CM Consult  Post Acute Care Choice:  NA Choice offered to:     DME Arranged:    DME Agency:     HH Arranged:    HH Agency:     Status of Service:  In process, will continue to follow  Medicare Important Message Given:    Date Medicare IM Given:    Medicare IM give by:    Date Additional Medicare IM Given:    Additional Medicare Important Message give by:     If discussed at [place] of Stay Meetings, dates discussed:    Additional Comments:  Leeroy Cha, RN 09/11/2015, 9:53 AM

## 2015-09-11 NOTE — Telephone Encounter (Signed)
Called floor for room 1222, asked his nurse Alba, RN if patient can come for radiation, "Yes can come via w/c " asked if patient needed pain med to please give if mneeded, coming for patient shortly,thanked RN 9:25 AM

## 2015-09-11 NOTE — Progress Notes (Addendum)
Walter French has completed 21 fractions to his esophagus.  He denies pain. He reports weakness.  He is currently an inpatient and has cardizem infusing into his right AC IV.  He reports having a lump in his chest and throat.  He reports he only eating a few bites and drinking warm water.  He is not taking carafate because it makes him sick. He reports having nausea when he drinks cold liquids.  His skin is intact on his chest.  His last chemotherapy will be on Thursday.

## 2015-09-11 NOTE — Progress Notes (Signed)
ANTIBIOTIC CONSULT NOTE - Follow Up  Pharmacy Consult for vancomycin, Zosyn Indication: rule out sepsis  Allergies  Allergen Reactions  . Adhesive [Tape]     Pulls skin off  . Morphine And Related Nausea And Vomiting    Patient Measurements: Height: 6' (182.9 cm) Weight: 224 lb (101.606 kg) IBW/kg (Calculated) : 77.6  Vital Signs: Temp: 98.1 F (36.7 C) (09/29 2353) Temp Source: Oral (09/29 2353) BP: 122/45 mmHg (09/30 0750) Pulse Rate: 110 (09/30 0750) Intake/Output from previous day: 09/29 0701 - 09/30 0700 In: 955.4 [I.V.:305.4; IV Piggyback:650] Out: 275 [Urine:275] Intake/Output from this shift:    Labs:  Recent Labs  09/09/15 1025 09/09/15 1025 09/10/15 1336 09/11/15 0345  WBC 3.0*  --  3.9* 2.3*  HGB 12.6*  --  12.3* 10.1*  PLT 68*  --  100* 63*  CREATININE  --  1.1 1.29* 0.82   Estimated Creatinine Clearance: 109.3 mL/min (by C-G formula based on Cr of 0.82). No results for input(s): VANCOTROUGH, VANCOPEAK, VANCORANDOM, GENTTROUGH, GENTPEAK, GENTRANDOM, TOBRATROUGH, TOBRAPEAK, TOBRARND, AMIKACINPEAK, AMIKACINTROU, AMIKACIN in the last 72 hours.   Microbiology: Recent Results (from the past 720 hour(s))  MRSA PCR Screening     Status: None   Collection Time: 09/10/15  8:37 PM  Result Value Ref Range Status   MRSA by PCR NEGATIVE NEGATIVE Final    Comment:        The GeneXpert MRSA Assay (FDA approved for NASAL specimens only), is one component of a comprehensive MRSA colonization surveillance program. It is not intended to diagnose MRSA infection nor to guide or monitor treatment for MRSA infections.     Medications:  Scheduled:  . allopurinol  300 mg Oral Daily  . aspirin EC  81 mg Oral Daily  . atorvastatin  40 mg Oral q1800  . enoxaparin (LOVENOX) injection  40 mg Subcutaneous Q24H  . famotidine  20 mg Oral Daily  . glipiZIDE  10 mg Oral BID AC  . [START ON 09/12/2015] Influenza vac split quadrivalent PF  0.5 mL Intramuscular  Tomorrow-1000  . insulin aspart  0-15 Units Subcutaneous TID WC  . insulin aspart  0-5 Units Subcutaneous QHS  . insulin detemir  50 Units Subcutaneous BID  . ipratropium  0.5 mg Nebulization QID  . levalbuterol  1.25 mg Nebulization QID  . levothyroxine  75 mcg Oral QAC breakfast  . piperacillin-tazobactam (ZOSYN)  IV  3.375 g Intravenous Q8H  . sodium chloride  3 mL Intravenous Q12H  . sucralfate  1 g Oral QID  . vancomycin  1,250 mg Intravenous Q24H   Infusions:  . sodium chloride 10 mL/hr at 09/11/15 0700  . diltiazem (CARDIZEM) infusion 11.5 mg/hr (09/11/15 0700)   Assessment: 69 yo presented to ER with CC dizziness. PMH inclues CHF, adenocarcinoma of lung currently being treated with chemotherapy, DM, gout and HTN. To start vancomycin and Zosyn for possible sepsis. On admission, WBC low, SCr elevated with CrCl N 54, and afebrile  9/29 rocephin x 2 9/29 >> vanc >> 9/29 >> Zosyn >>   Today, 09/11/2015 Remains afebrile WBC falling, 2.3k SCr improved 0.82 CrCl 89 ml/min/1.76m2 (normalized)  9/29 blood x2: sent 9/29 urine: sent  Goal of Therapy:  Vancomycin trough level 15-20 mcg/ml  Zosyn dose per renal function  Plan:   Adjust vancomycin to 750mg  IV q12h for improved renal function Check trough at steady state  Continue Zosyn 3.375g IV q8 (extended interval infusion) for CrCl > 20 ml/min Follow up renal function &  cultures, clinical course, de-escalation as appropriate  Peggyann Juba, PharmD, BCPS Pager: [phone]  09/11/2015 8:46 AM

## 2015-09-11 NOTE — Progress Notes (Signed)
PT Cancellation Note  Patient Details Name: Walter French MRN: [id number] DOB: 05/04/1946   Cancelled Treatment:    Reason Eval/Treat Not Completed: Patient declined, no reason specified ("pracher is here", patient states that he just walked to bathroom, requires no assist, familiy confirmed. will check back 10/1 for eval if needed.)   Claretha Cooper 09/11/2015, 2:33 PM Tresa Endo PT [phone]

## 2015-09-11 NOTE — Progress Notes (Signed)
Patient refused breathing treatment. Stated they did not make him feel well, he felt bad after the last treatment he received. Wanted pain medication. No distress noted. Vitals stable.

## 2015-09-11 NOTE — Progress Notes (Signed)
Pt refusing Levimir and pt explained that he only takes when his cbg is over 150 and he has not had it in several days.  He has not had any intake today due to nausea and vomiting, he is not on any iv fluids.  I looked back and Levimir has been held earlier today and pt did not take yesterday.  Levimir held and will discuss with MD on rounds. Pt cbg was 141. Fara Olden P

## 2015-09-11 NOTE — Progress Notes (Signed)
Department of Radiation Oncology  Phone:  [phone] Fax:        [phone]   INPATIENT   Weekly Treatment Note    Name: Walter French Date: 09/11/2015 MRN: [id number] DOB: 01-21-1946   Current dose: 37.8 Gy  Current fraction:21   MEDICATIONS: No current facility-administered medications for this encounter.   No current outpatient prescriptions on file.   Facility-Administered Medications Ordered in Other Encounters  Medication Dose Route Frequency Provider Last Rate Last Dose  . 0.9 %  sodium chloride infusion   Intravenous Continuous Robbie Lis, MD 10 mL/hr at 09/11/15 0700    . acetaminophen (TYLENOL) tablet 1,000 mg  1,000 mg Oral Q6H PRN Robbie Lis, MD   1,000 mg at 09/11/15 0042  . acetaminophen (TYLENOL) tablet 650 mg  650 mg Oral Q6H PRN Robbie Lis, MD      . allopurinol (ZYLOPRIM) tablet 300 mg  300 mg Oral Daily Robbie Lis, MD   300 mg at 09/11/15 3785  . aspirin EC tablet 81 mg  81 mg Oral Daily Robbie Lis, MD   81 mg at 09/11/15 8850  . atorvastatin (LIPITOR) tablet 40 mg  40 mg Oral q1800 Robbie Lis, MD      . famotidine (PEPCID) tablet 20 mg  20 mg Oral Daily Robbie Lis, MD   20 mg at 09/11/15 2774  . glipiZIDE (GLUCOTROL) tablet 10 mg  10 mg Oral BID AC Robbie Lis, MD   10 mg at 09/11/15 1287  . HYDROcodone-acetaminophen (NORCO/VICODIN) 5-325 MG per tablet 1 tablet  1 tablet Oral Q4H PRN Robbie Lis, MD   1 tablet at 09/11/15 0204  . [START ON 09/12/2015] Influenza vac split quadrivalent PF (FLUARIX) injection 0.5 mL  0.5 mL Intramuscular Tomorrow-1000 Robbie Lis, MD      . insulin aspart (novoLOG) injection 0-15 Units  0-15 Units Subcutaneous TID WC Robbie Lis, MD   0 Units at 09/11/15 0800  . insulin aspart (novoLOG) injection 0-5 Units  0-5 Units Subcutaneous QHS Robbie Lis, MD   0 Units at 09/11/15 0059  . insulin detemir (LEVEMIR) injection 50 Units  50 Units Subcutaneous BID Robbie Lis, MD   50 Units at  09/11/15 0100  . ipratropium (ATROVENT) nebulizer solution 0.5 mg  0.5 mg Nebulization Q4H PRN Robbie Lis, MD      . ipratropium (ATROVENT) nebulizer solution 0.5 mg  0.5 mg Nebulization QID Robbie Lis, MD   0.5 mg at 09/11/15 1214  . levalbuterol (XOPENEX) nebulizer solution 1.25 mg  1.25 mg Nebulization Q4H PRN Robbie Lis, MD      . levalbuterol Penne Lash) nebulizer solution 1.25 mg  1.25 mg Nebulization QID Robbie Lis, MD   1.25 mg at 09/11/15 1214  . levothyroxine (SYNTHROID, LEVOTHROID) tablet 75 mcg  75 mcg Oral QAC breakfast Robbie Lis, MD   75 mcg at 09/11/15 8676  . metoprolol tartrate (LOPRESSOR) tablet 25 mg  25 mg Oral Q6H Minus Breeding, MD      . morphine 2 MG/ML injection 1 mg  1 mg Intravenous Q2H PRN Robbie Lis, MD      . ondansetron [facility]) tablet 4 mg  4 mg Oral Q6H PRN Robbie Lis, MD       Or  . ondansetron [facility]) injection 4 mg  4 mg Intravenous Q6H PRN Robbie Lis, MD      . piperacillin-tazobactam (  ZOSYN) IVPB 3.375 g  3.375 g Intravenous Q8H Adrian Saran, RPH   3.375 g at 09/11/15 0518  . prochlorperazine (COMPAZINE) tablet 10 mg  10 mg Oral Q6H PRN Robbie Lis, MD      . sodium chloride 0.9 % injection 3 mL  3 mL Intravenous Q12H Robbie Lis, MD   3 mL at 09/11/15 7342  . sucralfate (CARAFATE) tablet 1 g  1 g Oral QID Robbie Lis, MD   1 g at 09/11/15 8768  . vancomycin (VANCOCIN) IVPB 750 mg/150 ml premix  750 mg Intravenous Q12H Emiliano Dyer, RPH      . zolpidem (AMBIEN) tablet 5 mg  5 mg Oral QHS PRN Dianne Dun, NP   5 mg at 09/11/15 1157     ALLERGIES: Adhesive and Morphine and related   LABORATORY DATA:  Lab Results  Component Value Date   WBC 2.3* 09/11/2015   HGB 10.1* 09/11/2015   HCT 29.5* 09/11/2015   MCV 83.3 09/11/2015   PLT 63* 09/11/2015   Lab Results  Component Value Date   NA 139 09/11/2015   K 3.6 09/11/2015   CL 111 09/11/2015   CO2 22 09/11/2015   Lab Results  Component Value Date    ALT 8* 09/11/2015   AST 19 09/11/2015   ALKPHOS 73 09/11/2015   BILITOT 1.3* 09/11/2015     NARRATIVE: Walter French was seen today for weekly treatment management. The chart was checked and the patient's films were reviewed.  Walter French has completed 21 fractions to his esophagus.  He denies pain. He reports weakness.  He is currently an inpatient and has cardizem infusing into his right AC IV.  He reports having a lump in his chest and throat.  He reports he only eating a few bites and drinking warm water.  He is not taking carafate because it makes him sick. He reports having nausea when he drinks cold liquids.  His skin is intact on his chest.  His last chemotherapy will be on Thursday.   PHYSICAL EXAMINATION: vitals were not taken for this visit.     ALERT, IN NO ACUTE DISTRESS  ASSESSMENT: The patient is doing satisfactorily with treatment.  PLAN: We will continue with the patient's radiation treatment as planned. The patient feels better today Currently an inpatient. I am pleased that we were able to resume his course of radiation treatment today.

## 2015-09-11 NOTE — Consult Note (Signed)
Patient ID: Walter French MRN: [id number], DOB/AGE: 05-14-1946   Admit date: 09/10/2015   Primary Physician: Karlene Einstein, MD Primary Cardiologist: Dr. Martinique Electrophysiologist: Dr. Caryl Comes  Pt. Profile:  69 y/o male with known h/o CAD s/p CABG x 3, ischemic cardiomyopathy with EF of 30-35%, h/o VT s/p ICD, chronic combined systolic + diastolic HF, PAD, HTN, HLD, DM, CKD, OSA, also undergoing treatment for gastroesophageal cancer who presented with weakness, dyspnea and hypoxia. Admitted for sepsis and found to be in new onset atrial fibrillation w/ RVR.   Problem List  Past Medical History  Diagnosis Date  . Hyperlipidemia   . Hypertension   . Coronary heart disease     a. s/p CABG x 3 (VG->PDA, VG->PL, LIMA->LAD);  b. 2013 Abnl Myoview;  c. 2013 Cath: 3/3 patent grafts, occluded LCX which correlated w/ ischemia on MV->not amenable to PCI->Med Rx.  . Obesity   . CHF (congestive heart failure)   . VT (ventricular tachycardia)   . SVT (supraventricular tachycardia)   . PAD (peripheral artery disease)     Angiography in February of 2014: Moderate left iliac artery stenosis, mild to moderate diffuse right SFA disease. Severe proximal right popliteal artery stenosis with three-vessel runoff below the knee. Status post self-expanding stent placement to the proximal popliteal artery  . Implantable cardioverter-defibrillator Mdt   . Asthma     "small touch" (07/29/2013)  . History of pneumonia     "3-4 times; last time ~ 2003" (07/29/2013)  . OSA on CPAP   . GERD (gastroesophageal reflux disease)   . H/O hiatal hernia   . Gout   . Anxiety   . Allergy   . Stomach cancer 07/23/2015    invasive adenocarcinoma ,esophagus ,distal   . Type II diabetes mellitus     on insulin  . Sleep apnea   . Hyperlipidemia   . PAD (peripheral artery disease)   . CHF (congestive heart failure)     Past Surgical History  Procedure Laterality Date  . Tonsillectomy    . Cardiac  catheterization    . Wrist fracture surgery Right   . Cataract extraction w/ intraocular lens implant Left   . Cardiac defibrillator placement  1997; ~ 2003  . Coronary artery bypass graft  1997; 2007    "X3" (07/29/2013)  . Nerve, tendon and artery repair Left 09/20/2013    Procedure: IRRIGATION AND DEBRIDEMENT,Exploration and Repair of Ulnar/Digital  Artery Nerve of Left Index finger;  Surgeon: Roseanne Kaufman, MD;  Location: [place];  Service: Orthopedics;  Laterality: Left;  . Abdominal aortagram N/A 01/30/2013    Procedure: ABDOMINAL Maxcine Ham;  Surgeon: Wellington Hampshire, MD;  Location: [facility] CATH LAB;  Service: Cardiovascular;  Laterality: N/A;  . Cardiac catheterization N/A 07/22/2015    Procedure: Left Heart Cath and Coronary Angiography;  Surgeon: Peter M Martinique, MD;  Location: [place] CV LAB;  Service: Cardiovascular;  Laterality: N/A;  . Esophagogastroduodenoscopy N/A 07/23/2015    Procedure: ESOPHAGOGASTRODUODENOSCOPY (EGD);  Surgeon: Wonda Horner, MD;  Location: [facility] ENDOSCOPY;  Service: Endoscopy;  Laterality: N/A;     Allergies  Allergies  Allergen Reactions  . Adhesive [Tape]     Pulls skin off  . Morphine And Related Nausea And Vomiting    HPI  69 y/o male, followed by Dr. Martinique, Dr. Fletcher Anon and Dr. Caryl Comes. His past cardiac history is significant for CAD s/p CABG x3 (VG->PDA, VG->PL, LIMA->LAD).  In 2013, he had an abnormal NST. Subsequent  cath showed 3/3 patent grafts, occluded LCX which correlated w/ ischemia on Myoview. This was not amendable to PCI and medical therapy was elected. His most recent cath 07/2015 was unchanged, with 3/3 patent grafts. He also has an ischemic cardiomyopathy/chronic combined systolic + diastolic HF. 2D echo 07/2015 showed an EF of 30-35%. He has an ICD which is followed by Dr. Lynelle Doctor. He also has a h/o PVD, followed by Dr. Fletcher Anon and is s/p right popliteal artery stenting, h/o VT, HTN, HLD, DM, CKD, asthma, GERD, and OSA. Most recently, he was diagnosed  with adenocarcinoma of the gastroesophageal junction. Staging CT was significant for metastatic adenopathy. He is currently undergoing radiation + chemo.   He presented to the [facility] ED 09/10/15 with complaints of dizziness, weakness and dyspnea. Also notes 1 week h/o diarrhea and decreased PO intake, due to in ability to keep anything down given his chemo. On arrival, he was hypoxic with O2 sats in the upper 70s and was placed on supplemental O2. EKG in the ED revealed atrial fibrillation with a RVR of 125 bpm. BP was stable at 121/59. He was afebrile. WBC and  Hgb were all WNL. Troponin with flat low level trend, 0.06, 0.05, 0.04. CXR negative for infiltrate, effusion and edema. CT of chest was negative for PE. He was placed on a Cardizem drip for rate control. Cardiology consulted for further recommendations. Internal medicine continuing w/u for sepsis. He is on IV antibiotics.   The patient denies any chest pain. No palpitations. No significant dyspnea at this point. He mainly feels weak.     Home Medications  Prior to Admission medications   Medication Sig Start Date End Date Taking? Authorizing Provider  acetaminophen (TYLENOL) 500 MG tablet Take 1,000 mg by mouth every 6 (six) hours as needed for moderate pain or headache.   Yes Historical Provider, MD  allopurinol (ZYLOPRIM) 300 MG tablet Take 300 mg by mouth daily.     Yes Historical Provider, MD  aspirin 81 MG tablet Take 1 tablet (81 mg total) by mouth daily. 07/23/15  Yes Burgess Estelle, MD  atorvastatin (LIPITOR) 40 MG tablet Take 1 tablet (40 mg total) by mouth daily. 03/11/15  Yes Deboraha Sprang, MD  dexamethasone (DECADRON) 4 MG tablet Take 10 mg at 10pm..... night before chemotherapy 08/06/15  Yes Ladell Pier, MD  glipiZIDE (GLUCOTROL) 10 MG tablet Take 10 mg by mouth 2 (two) times daily before a meal.  09/18/12  Yes Historical Provider, MD  hyaluronate sodium (RADIAPLEXRX) GEL Apply 1 application topically as needed.   Yes Historical  Provider, MD  HYDROcodone-acetaminophen (NORCO/VICODIN) 5-325 MG per tablet TAKE 1 TABLET BY MOUTH 4 TIMES DAILY AS NEEDED FOR PAIN 09/03/15  Yes Owens Shark, NP  insulin detemir (LEVEMIR) 100 UNIT/ML injection Inject 85 Units into the skin 2 (two) times daily.    Yes Historical Provider, MD  levothyroxine (SYNTHROID, LEVOTHROID) 75 MCG tablet Take 75 mcg by mouth daily before breakfast.  12/06/14  Yes Historical Provider, MD  Liraglutide (VICTOZA) 18 MG/3ML SOPN Inject 1.8 mg into the skin daily.   Yes Historical Provider, MD  metoprolol succinate (TOPROL-XL) 100 MG 24 hr tablet Take 1 tablet (100 mg total) by mouth daily. Take with or immediately following a meal. 02/16/15  Yes Peter M Martinique, MD  nitroGLYCERIN (NITROSTAT) 0.4 MG SL tablet Place 1 tablet (0.4 mg total) under the tongue every 5 (five) minutes x 3 doses as needed for chest pain. 10/03/14  Yes Collier Salina  M Martinique, MD  NOVOLOG FLEXPEN 100 UNIT/ML FlexPen Inject 20 Units as directed daily.  04/05/15  Yes Historical Provider, MD  [place] [facility]   Yes Historical Provider, MD  prochlorperazine (COMPAZINE) 10 MG tablet Take 1 tablet (10 mg total) by mouth every 6 (six) hours as needed for nausea or vomiting. 09/03/15  Yes Owens Shark, NP  ranitidine (ZANTAC) 300 MG tablet Take 1 tablet (300 mg total) by mouth daily as needed for heartburn. 02/16/15  Yes Peter M Martinique, MD  sucralfate (CARAFATE) 1 G tablet Take 1 tablet (1 g total) by mouth 4 (four) times daily. 08/21/15  Yes Kyung Rudd, MD  traMADol (ULTRAM) 50 MG tablet Take 1 tablet (50 mg total) by mouth every 6 (six) hours as needed for moderate pain. 07/30/15  Yes Ladell Pier, MD  valsartan (DIOVAN) 160 MG tablet Take 1 tablet (160 mg total) by mouth daily. 02/25/15  Yes Deboraha Sprang, MD  metroNIDAZOLE (METROGEL) 0.75 % gel Apply topically 2 (two) times daily. Patient not taking: Reported on 09/10/2015 07/23/15   Burgess Estelle, MD    Family History  Family History    Problem Relation Age of Onset  . Heart disease Brother   . Cancer Brother   . Breast cancer Mother     Social History  Social History   Social History  . Marital Status: Single    Spouse Name: N/A  . Number of Children: 0  . Years of Education: N/A   Occupational History  . retired Surveyor, mining   .     Social History Main Topics  . Smoking status: Former Smoker -- 2.00 packs/day for 30 years    Types: Cigarettes    Quit date: 12/13/1995  . Smokeless tobacco: Never Used  . Alcohol Use: No     Comment: 07/29/2013 "not touched any alcohol since 1997"  . Drug Use: No  . Sexual Activity: Not Currently   Other Topics Concern  . Not on file   Social History Narrative   Single, never married   Retired Administrator   Close friend, [name] and her children are his main support system   Independent in ADLs.     Review of Systems General:  No chills, fever, night sweats or weight changes.  Cardiovascular:  No chest pain, dyspnea on exertion, edema, orthopnea, palpitations, paroxysmal nocturnal dyspnea. Dermatological: No rash, lesions/masses Respiratory: No cough, dyspnea Urologic: No hematuria, dysuria Abdominal:   No nausea, vomiting, diarrhea, bright red blood per rectum, melena, or hematemesis Neurologic:  No visual changes, wkns, changes in mental status. All other systems reviewed and are otherwise negative except as noted above.  Physical Exam  Blood pressure 104/73, pulse 105, temperature 98.1 F (36.7 C), temperature source Oral, resp. rate 28, height 6' (1.829 m), weight 224 lb (101.606 kg), SpO2 98 %.  General: Pleasant, NAD Psych: Normal affect. Neuro: Alert and oriented X 3. Moves all extremities spontaneously. HEENT: Normal  Neck: Supple without bruits or JVD. Lungs:  Resp regular and unlabored, decreased BS bilaterally. Heart: irregularly irregular, tachy rate no s3, s4, or murmurs. Abdomen: Soft, non-tender, non-distended, BS + x 4.   Extremities: No clubbing, cyanosis or edema. DP/PT/Radials 2+ and equal bilaterally.  Labs  Troponin [facility] of Care Test)  Recent Labs  09/10/15 1704  TROPIPOC 0.03    Recent Labs  09/10/15 2125 09/11/15 0115  TROPONINI 0.06* 0.05*   Lab Results  Component Value Date  WBC 2.3* 09/11/2015   HGB 10.1* 09/11/2015   HCT 29.5* 09/11/2015   MCV 83.3 09/11/2015   PLT 63* 09/11/2015    Recent Labs Lab 09/11/15 0345  NA 139  K 3.6  CL 111  CO2 22  BUN 14  CREATININE 0.82  CALCIUM 8.5*  PROT 5.7*  BILITOT 1.3*  ALKPHOS 73  ALT 8*  AST 19  GLUCOSE 116*   Lab Results  Component Value Date   CHOL 138 07/30/2013   HDL 25* 07/30/2013   LDLCALC 49 07/30/2013   TRIG 321* 07/30/2013   Lab Results  Component Value Date   DDIMER * 08/21/2007    0.69        AT THE INHOUSE ESTABLISHED CUTOFF VALUE OF 0.48 ug/mL FEU, THIS ASSAY HAS BEEN DOCUMENTED IN THE LITERATURE TO HAVE     Radiology/Studies  Ct Angio Chest Pe W/cm &/or Wo Cm  09/10/2015   CLINICAL DATA:  Presyncope, esophageal carcinoma  EXAM: CT ANGIOGRAPHY CHEST WITH CONTRAST  TECHNIQUE: Multidetector CT imaging of the chest was performed using the standard protocol during bolus administration of intravenous contrast. Multiplanar CT image reconstructions and MIPs were obtained to evaluate the vascular anatomy.  CONTRAST:  127mL OMNIPAQUE IOHEXOL 350 MG/ML SOLN  COMPARISON:  Chest radiograph same date, PET-CT 08/07/2015  FINDINGS: Mediastinum/Nodes: Left-sided AICD in place. Evidence of CABG. Great vessels are normal in caliber. Moderate atheromatous coronary arterial and aortic calcification.  Mild esophageal distention is identified. The previously evaluated mass of the distal esophagus is not specifically re- visualized without oral contrast today.  Periesophageal lymph node measures 0.8 cm in maximal short axis diameter image 53. Retrocrural distal esophageal node measures 1.0 cm image 67.  Heart size is mildly  enlarged.  No pericardial effusion.  The examination is adequate for evaluation for acute pulmonary embolism up to and including the 3rd order pulmonary arteries. No focal filling defect is identified up to and including the 3rd order pulmonary arteries to suggest acute pulmonary embolism.  Lungs/Pleura: Minimal bibasilar dependent subpleural atelectasis. Bibasilar bronchiectasis is noted. Lingular volume loss. No measurable nodule, consolidation, or mass.  Central airways are patent.  Upper abdomen: Otherwise normal, please see comments above.  Musculoskeletal: No acute osseous abnormality or lytic or sclerotic osseous lesion.  Review of the MIP images confirms the above findings.  IMPRESSION: No acute cardiopulmonary process.  Mild bronchiectasis. Lingular volume loss and scarring or atelectasis.  Mild esophageal distention by fluid and periesophageal/retrocrural lymphadenopathy compatible with the provided history of esophageal carcinoma.   Electronically Signed   By: Conchita Paris M.D.   On: 09/10/2015 19:41   Dg Chest Port 1 View  09/10/2015   CLINICAL DATA:  Dizziness. Currently receiving treatment for esophageal carcinoma  EXAM: PORTABLE CHEST 1 VIEW  COMPARISON:  07/20/2015  FINDINGS: Heart size upper normal. Postop CABG. AICD unchanged in position. Negative for heart failure  Mild left lower lobe atelectasis has progressed in the interval. Negative for pneumonia or effusion. No mass or lung nodule.  IMPRESSION: Mild left lower lobe atelectasis.   Electronically Signed   By: Franchot Gallo M.D.   On: 09/10/2015 14:00    ECG Atrial fibrillation w/ RVR 156 bpm    ASSESSMENT AND PLAN  Principal Problem:   Acute respiratory failure with hypoxia Active Problems:   Hyperlipidemia   Coronary artery disease-status post CABG   Automatic implantable cardioverter-defibrillator in situ   Systolic and diastolic CHF, chronic   Near syncope   Gastroesophageal cancer  Atrial fibrillation   CKD  (chronic kidney disease) stage 3, GFR 30-59 ml/min   Anemia of chronic disease   Leukopenia due to antineoplastic chemotherapy   Thrombocytopenia   Pancytopenia due to antineoplastic chemotherapy   Type 2 diabetes mellitus with renal manifestations   Hypothyroidism   Gout   Sepsis  1. Atrial Fibrillation w/ RVR: still in afib despite IV Cardizem. His rate remains elevated in the 110s-130s. BP is stable and he is tolerating ok at this point. K is stable and TSH is normal. His afib is likely triggered by his acute sepsis. Continue treatement of underlying medical issues. Since he has had no conversion, or success in rate reduction with Cardizem, will likely try IV amiodarone today. Although his CHA2DS2 VASc score is high at 5 (HTN, CHF, DM, Age 31-74 and Vascular disease), would avoid anticoagulation for now, given his thrombocytopenia, as his bleed risk may outweigh benefit.   2. Abnormal Troponin: Troponin with flat low level trend, 0.06, 0.05, 0.04. Likely secondary to rapid afib and sepsis. Non indication for any ischemic w/u.   3. CAD: s/p remote CABG x 3. Recent LHC 07/2015 showed stable anatomy with 3/3 patent grafts. Denies CP. Continue medical therapy.   4. Ischemic Cardiomyopathy: EF 30-35% by recent echo 07/2015. He has an ICD.   5. Chronic Combined Systolic + Diastolic CHF: volume appears stable.   6. ICD: followed by Dr. Caryl Comes  7. HTN: well controlled on current regimen   8. HLD: on Lipitor.   9. DM: per primary  10. Gastroesophageal Cancer: undergoing chem. Continue treatment per oncology.   11. CKD: renal function currently stable. SCr 0.82.   12. Thrombocytopenia: platelets continue to downtrend from 100 down to 63 today. Monitor closely.      Signed, SIMMONS, Silas Flood, PA-C 09/11/2015, 7:44 AM   History and all data above reviewed.  Patient examined.  I agree with the findings as above.  He says that he feels well.  He felt his heart beating fast and was dizzy  yesterday.  Mild left upper chest discomfort.  He has never had angina.  He reports chronic SOB but no exacerbation of this.  Being treated for possible sepsis with mild elevation of lactic acid.  BPs stable.  No clear source of infection.  The patient exam reveals [id number]  ,  Lungs: Clear  ,  Abd: Positive bowel sounds, no rebound no guarding, Ext No edema  .  All available labs, radiology testing, previous records reviewed. Agree with documented assessment and plan. Atrial fib:  Not entirely clear when this started.  Could have been yesterday but not clear enough to allow cardioversion now.  We will plan rate control and should start on DOAC if he is thought to have low bleeding risk and has stable platelets.   His rate is not severely elevated.  I will order PO metoprolol and see if his rates are OK with this.  ARB is being held and he was no PO beta blocker prior to discharge.         Jeneen Rinks Hochrein  10:16 AM  09/11/2015

## 2015-09-11 NOTE — Progress Notes (Signed)
PT Cancellation Note  Patient Details Name: Walter French MRN: [id number] DOB: 02-23-1946   Cancelled Treatment:    Reason Eval/Treat Not Completed: Patient at procedure or test/unavailable (to radiation) will return this PM for evaluation.   Claretha Cooper 09/11/2015, 10:46 AM Tresa Endo PT [phone]

## 2015-09-11 NOTE — Progress Notes (Signed)
Initial Nutrition Assessment  DOCUMENTATION CODES:   Severe malnutrition in context of chronic illness  INTERVENTION:  Magic cup TID with meals, each supplement provides 290 kcal and 9 grams of protein Ice Cream and Beef Broth nourishments. Continue to provide as tolerated.    NUTRITION DIAGNOSIS:   Inadequate oral intake related to cancer and cancer related treatments, vomiting as evidenced by per patient/family report, percent weight loss.    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 Presented to [facility] ED with dizziness, seeing spots, SOB, and feeling weak.  When I spoke to pt about eating, he said his stomach  burns due to radiation, and therefore he does not eat. His nurse gave him ice cream, which he tolerated, I added more nourishments of that. He also mentioned beef broth, added that to nourishments as well, in addition to magic cup.  Continue to provid as tolerated. No signs of malnutrition but patient does have severe 26#/10% wt loss in 1 month. Radiation started 8/31.    Diet Order:  Diet Heart Room service appropriate?: Yes; Fluid consistency:: Thin  Skin:  Reviewed, no issues  Last BM:  None at this time  Height:   Ht Readings from Last 1 Encounters:  09/10/15 6' (1.829 m)    Weight:   Wt Readings from Last 1 Encounters:  09/10/15 224 lb (101.606 kg)    Ideal Body Weight:  80.9 kg  BMI:  Body mass index is 30.37 kg/(m^2).  Estimated Nutritional Needs:   Kcal:  2000-2200  Protein:  100-120  Fluid:  >=/ 2L  EDUCATION NEEDS:   No education needs identified at this time  Satira Anis. Ward, MS, RD LDN After Hours/Weekend Pager [phone]

## 2015-09-11 NOTE — Progress Notes (Addendum)
Patient ID: Walter French, male   DOB: [birth year], 69 y.o.   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 Patient was just seen in cancer center 09/09/2015 for blood work and oncology decided that his next chemo will be rescheduled for 09/17/2015 due to thrombocytopenia.   Patient presented to [facility] ED with repots of dizzy spells, seeing spots and almost passing out.  In ED, BP was 121/59, HR 125, RR 19, T max 98 F and oxygen saturation as low as 79% on room air. This has improved with [place] oxygen support to 100%. The 12 lead EKG showed atrial fibrillation. He was started on Cardizem drip. The blood work was notable for WBC count of 3.9, hemoglobin of 12.3, platelets of 100, creatinine of 1.29. CXR showed mild left lower lobe atelectasis. Lactic acid was 2.18. Patient was given empiric Rocephin in ED. Because he met sepsis criteria antibiotics were broadened to Zosyn and vancomycin. CT angiogram of the chest did not reveal pulmonary embolism, no acute cardiopulmonary process.    Assessment/Plan:    Principal Problem:  Acute respiratory failure with hypoxia -  Chest x-ray on admission showed left lower lung lobe atelectasis. CT Angio chest did not demonstrate pulmonary embolism. Other than mild bronchiectasis there was no acute cardiopulmonary findings.  - He still seems to be a bit short of breath this morning.  - Continue Xopenex and Atrovent nebulizer treatments as  scheduled and as needed for shortness of breath.  - Continue oxygen support via nasal cannula to keep oxygen saturation above 90%.  - Continue to monitor in step down unit for next 24 hours.   Active Problems:  Sepsis, unspecified organism - Sepsis criteria met on the admission with tachycardia, tachypnea, hypoxia, leukopenia and lactic acidosis. Sepsis workup initiated. Procalcitonin was mildly elevated at 0.13. - Unclear source of infection. Chest x-ray showed left lower lung lobe atelectasis. Urinalysis did not reveal leukocytes or nitrites.  - Patient was given empiric Rocephin in ED but we broadened the coverage to vancomycin and Zosyn until blood cultures and urine culture results are available. Please note patient is immunocompromised, has had recent chemotherapy for his diagnosis of esophageal cancer. - Blood pressure is on soft side but this is because he is on Cardizem drip. He does not require pressor support at this time.    Atrial fibrillation, new onset - Likely secondary to acute respiratory failure with hypoxia as well as sepsis.  - CHADS vasc score at least 5 - Patient was started on Cardizem drip at the time of the admission. He still requires Cardizem drip. - Cardiology consulted. Appreciate their input. - Please note we did not start any anticoagulation because of thrombocytopenia. On admission, platelet count was 100 but again around 60s this morning.      Troponin level elevated - Likely demand ischemia from hypoxia, sepsis. - We'll continue to monitor. Cardiology following. Appreciate their input.   Hyperlipidemia - We will continue statin therapy   Systolic and diastolic CHF, chronic / Automatic implantable cardioverter-defibrillator in situ / Coronary artery disease-status post  CABG - 2 D ECHO in 07/2015 with EF 30-35% with grade 2 diastolic dysfunction  - Appreciate cardiology consult and recommendations. - Patient is not on IV Lasix. He cannot receive  Lasix at this time anyway because of a soft blood pressure - Has ICD   Near syncope - Likely due to hypoxia, a fib - obtain physical therapy evaluation.   Gastroesophageal cancer - Managed by Dr. Benay Spice, informed his nurse of patient's admission.  - Last chemo 09/03/15 - Continue RT as per schedule    CKD (chronic kidney disease) stage 3, GFR 30-59 ml/min - Baseline creatinine 1.6 about 8 years ago - creatinine remains stable on this admission.   Anemia of chronic disease - Anemia secondary to chronic kidney disease as well as history of malignancy and sequela of recent chemotherapy. The last chemotherapy infusion was 09/03/2015.  - Hemoglobin is 10.1 today.   Leukopenia due to antineoplastic chemotherapy / Thrombocytopenia / Pancytopenia due to antineoplastic chemotherapy - Pancytopenia secondary to recent chemotherapy (last infusion received 09/03/2015) - Platelet count down to 63 this morning.    Type 2 diabetes mellitus with renal manifestations - Continue Levemir 50 units twice daily. Please note patient is on higher dose of insulin at home however because of less PO intake we have reduced the insulin dose. Continue glipizide. - CBG 118. Check CBG every 6 hours per step down unit protocol. - Continue sliding scale insulin.   Hypothyroidism - Continue synthroid - TSH WNL   Gout - Continue allopurinol   DVT prophylaxis:  - Lovenox subQ ordered however we will stop it because of thrombocytopenia. Initial platelet count was 100 but today it is 63 so we will use SCDs bilaterally for DVT prophylaxis.    Code Status: Full.  Family Communication:  plan of care discussed with the patient Disposition Plan: requires Cardizem drip for which reason he will remain in step down unit..   IV access:  Peripheral IV  Procedures and diagnostic studies:    Ct Angio Chest Pe W/cm &/or Wo Cm 09/10/2015  No acute cardiopulmonary process.  Mild bronchiectasis. Lingular volume  loss and scarring or atelectasis.  Mild esophageal distention by fluid and periesophageal/retrocrural lymphadenopathy compatible with the provided history of esophageal carcinoma.   Electronically Signed   By: Conchita Paris M.D.   On: 09/10/2015 19:41   Dg Chest Port 1 View 09/10/2015    Mild left lower lobe atelectasis.   Electronically Signed   By: Franchot Gallo M.D.   On: 09/10/2015 14:00   Medical Consultants:  Cardiology Oncology - Dr. Benay Spice   Other Consultants:  Physical therapy Nutrition  IAnti-Infectives:   Vancomycin 09/10/2015 --> Zosyn 09/10/2015 -->   Leisa Lenz, MD  [facility] Pager [phone]  Time spent in minutes: 25 minutes  If 7PM-7AM, please contact night-coverage [email or web address] Password [email or web address] 09/11/2015, 9:22 AM   LOS: 1 day    HPI/Subjective: No acute overnight events. Patient reports feeling weak.  Objective: Filed Vitals:   09/11/15 0600 09/11/15 0700 09/11/15 0728 09/11/15 0750  BP: 104/73 107/60  122/45  Pulse: 105 97  110  Temp:      TempSrc:      Resp: 28 23  40  Height:      Weight:      SpO2: 97% 95% 98% 99%    Intake/Output Summary (Last 24 hours) at 09/11/15 0922 Last data filed at 09/11/15 0700  Gross per 24 hour  Intake 955.41 ml  Output  275 ml  Net 680.41 ml    Exam:   General:  Pt is alert, follows commands appropriately, not in acute distress  Cardiovascular: Irregular rhythm, tachycardic, appreciate S1, S2   Respiratory: Diminished breath sounds, no rhonchi  Abdomen: Soft, non tender, non distended, bowel sounds present  Extremities: No edema, pulses DP and PT palpable bilaterally  Neuro: Grossly nonfocal  Data Reviewed: Basic Metabolic Panel:  Recent Labs Lab 09/09/15 1025 09/10/15 1336 09/11/15 0345  NA 137 134* 139  K 4.4 3.5 3.6  CL  --  105 111  CO2 _0 GLUCOSE 144* 142* 116*  BUN 11._1 CREATININE 1.1 1.29* 0.82  CALCIUM 9.6 8.9 8.5*   Liver Function  Tests:  Recent Labs Lab 09/09/15 1025 09/10/15 1336 09/11/15 0345  AST _2 ALT <9 10* 8*  ALKPHOS 109 93 73  BILITOT 1.80* 1.9* 1.3*  PROT 6.7 6.8 5.7*  ALBUMIN 3.4* 3.5 2.9*   No results for input(s): LIPASE, AMYLASE in the last 168 hours. No results for input(s): AMMONIA in the last 168 hours. CBC:  Recent Labs Lab 09/09/15 1025 09/10/15 1336 09/11/15 0345  WBC 3.0* 3.9* 2.3*  NEUTROABS 2.3 2.9  --   HGB 12.6* 12.3* 10.1*  HCT 36.0* 35.9* 29.5*  MCV 82.0 82.5 83.3  PLT 68* 100* 63*   Cardiac Enzymes:  Recent Labs Lab 09/10/15 2125 09/11/15 0115 09/11/15 0718  TROPONINI 0.06* 0.05* 0.04*   BNP: Invalid input(s): POCBNP CBG:  Recent Labs Lab 09/10/15 2352  GLUCAP 118*    Recent Results (from the past 240 hour(s))  MRSA PCR Screening     Status: None   Collection Time: 09/10/15  8:37 PM  Result Value Ref Range Status   MRSA by PCR NEGATIVE NEGATIVE Final     Scheduled Meds: . allopurinol  300 mg Oral Daily  . aspirin EC  81 mg Oral Daily  . atorvastatin  40 mg Oral q1800  . enoxaparin (LOVENOX) injection  40 mg Subcutaneous Q24H  . famotidine  20 mg Oral Daily  . glipiZIDE  10 mg Oral BID AC  . [START ON 09/12/2015] Influenza vac split quadrivalent PF  0.5 mL Intramuscular Tomorrow-1000  . insulin aspart  0-15 Units Subcutaneous TID WC  . insulin aspart  0-5 Units Subcutaneous QHS  . insulin detemir  50 Units Subcutaneous BID  . ipratropium  0.5 mg Nebulization QID  . levalbuterol  1.25 mg Nebulization QID  . levothyroxine  75 mcg Oral QAC breakfast  . piperacillin-tazobactam (ZOSYN)  IV  3.375 g Intravenous Q8H  . sodium chloride  3 mL Intravenous Q12H  . sucralfate  1 g Oral QID  . vancomycin  750 mg Intravenous Q12H   Continuous Infusions: . sodium chloride 10 mL/hr at 09/11/15 0700  . diltiazem (CARDIZEM) infusion 11.5 mg/hr (09/11/15 0700)

## 2015-09-12 DIAGNOSIS — I481 Persistent atrial fibrillation: Secondary | ICD-10-CM

## 2015-09-12 DIAGNOSIS — J9601 Acute respiratory failure with hypoxia: Secondary | ICD-10-CM

## 2015-09-12 DIAGNOSIS — R7989 Other specified abnormal findings of blood chemistry: Secondary | ICD-10-CM

## 2015-09-12 DIAGNOSIS — I2510 Atherosclerotic heart disease of native coronary artery without angina pectoris: Secondary | ICD-10-CM

## 2015-09-12 DIAGNOSIS — A419 Sepsis, unspecified organism: Secondary | ICD-10-CM | POA: Diagnosis not present

## 2015-09-12 DIAGNOSIS — Z794 Long term (current) use of insulin: Secondary | ICD-10-CM

## 2015-09-12 LAB — CBC
HEMATOCRIT: 31.6 % — AB (ref 39.0–52.0)
Hemoglobin: 10.7 g/dL — ABNORMAL LOW (ref 13.0–17.0)
MCH: 28.2 pg (ref 26.0–34.0)
MCHC: 33.9 g/dL (ref 30.0–36.0)
MCV: 83.4 fL (ref 78.0–100.0)
PLATELETS: 84 10*3/uL — AB (ref 150–400)
RBC: 3.79 MIL/uL — ABNORMAL LOW (ref 4.22–5.81)
RDW: 15.7 % — ABNORMAL HIGH (ref 11.5–15.5)
WBC: 2.6 10*3/uL — AB (ref 4.0–10.5)

## 2015-09-12 LAB — GLUCOSE, CAPILLARY
GLUCOSE-CAPILLARY: 115 mg/dL — AB (ref 65–99)
GLUCOSE-CAPILLARY: 209 mg/dL — AB (ref 65–99)
GLUCOSE-CAPILLARY: 70 mg/dL (ref 65–99)
Glucose-Capillary: 93 mg/dL (ref 65–99)

## 2015-09-12 LAB — URINE CULTURE: CULTURE: NO GROWTH

## 2015-09-12 MED ORDER — DIGOXIN 0.25 MG/ML IJ SOLN
0.2500 mg | Freq: Four times a day (QID) | INTRAMUSCULAR | Status: AC
  Administered 2015-09-12 – 2015-09-13 (×4): 0.25 mg via INTRAVENOUS
  Filled 2015-09-12 (×8): qty 1

## 2015-09-12 MED ORDER — METOPROLOL TARTRATE 25 MG PO TABS
25.0000 mg | ORAL_TABLET | ORAL | Status: AC
  Administered 2015-09-12: 25 mg via ORAL
  Filled 2015-09-12: qty 1

## 2015-09-12 MED ORDER — METOPROLOL TARTRATE 25 MG PO TABS
25.0000 mg | ORAL_TABLET | Freq: Once | ORAL | Status: AC
  Administered 2015-09-12: 25 mg via ORAL
  Filled 2015-09-12: qty 1

## 2015-09-12 MED ORDER — LOPERAMIDE HCL 2 MG PO CAPS
2.0000 mg | ORAL_CAPSULE | Freq: Four times a day (QID) | ORAL | Status: DC | PRN
  Administered 2015-09-12 – 2015-09-14 (×3): 2 mg via ORAL
  Filled 2015-09-12 (×3): qty 1

## 2015-09-12 MED ORDER — DIGOXIN 250 MCG PO TABS
0.2500 mg | ORAL_TABLET | Freq: Every day | ORAL | Status: DC
  Administered 2015-09-13 – 2015-09-14 (×2): 0.25 mg via ORAL
  Filled 2015-09-12: qty 1
  Filled 2015-09-12: qty 2
  Filled 2015-09-12: qty 1

## 2015-09-12 MED ORDER — SODIUM CHLORIDE 0.9 % IV BOLUS (SEPSIS)
250.0000 mL | Freq: Once | INTRAVENOUS | Status: AC
  Administered 2015-09-12: 250 mL via INTRAVENOUS

## 2015-09-12 MED ORDER — LEVOFLOXACIN IN D5W 750 MG/150ML IV SOLN
750.0000 mg | INTRAVENOUS | Status: DC
  Administered 2015-09-12 – 2015-09-14 (×3): 750 mg via INTRAVENOUS
  Filled 2015-09-12 (×3): qty 150

## 2015-09-12 MED ORDER — METOPROLOL TARTRATE 50 MG PO TABS
50.0000 mg | ORAL_TABLET | Freq: Two times a day (BID) | ORAL | Status: DC
  Administered 2015-09-12 – 2015-09-14 (×5): 50 mg via ORAL
  Filled 2015-09-12 (×3): qty 1
  Filled 2015-09-12 (×2): qty 2

## 2015-09-12 NOTE — Progress Notes (Signed)
Patient ID: Walter French, male   DOB: August 07, 1946, 69 y.o.   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 Patient was just seen in cancer center 09/09/2015 for blood work and oncology decided that his next chemo will be rescheduled for 09/17/2015 due to thrombocytopenia.   Patient presented to [facility] ED with repots of dizzy spells, seeing spots and almost passing out.  In ED, BP was 121/59, HR 125, RR 19, T max 98 F and oxygen saturation as low as 79% on room air. This has improved with [place] oxygen support to 100%. The 12 lead EKG showed atrial fibrillation. He was started on Cardizem drip. The blood work was notable for WBC count of 3.9, hemoglobin of 12.3, platelets of 100, creatinine of 1.29. CXR showed mild left lower lobe atelectasis. Lactic acid was 2.18. Patient was given empiric Rocephin in ED. Because he met sepsis criteria antibiotics were broadened to Zosyn and vancomycin. CT angiogram of the chest did not reveal pulmonary embolism, no acute cardiopulmonary process.  Barrier to discharge: now on PO metoprolol but HR still in 130's. Not yet stable for discharge. Transfer to telemetry today.     Assessment/Plan:    Principal Problem:  Acute respiratory failure with hypoxia - Left lower lung lobe atelectasis seen on admission CXR. CT angiogram done on admission and was negative for pulmonary embolism - His respiratory status is more stable today - We will  continue Xopenex and Atrovent nebulizer treatments 4 times daily as scheduled and every 4 hours as needed for shortness of breath.  - We will continue oxygen support via nasal cannula to keep oxygen saturation above 90%.  - Transfer to telemetry floor today.   Active Problems:  Sepsis, unspecified organism - Sepsis criteria met on the admission ( tachycardia, tachypnea, hypoxia, leukopenia and lactic acidosis). Procalcitonin was mildly elevated at 0.13 - No obvious source of infection, CXR and UA unremarkable.  - He was started on empiric vanco and zosyn. Will change today to Levaquin. - Blood cultures so far show no growth.    Atrial fibrillation, new onset - Likely secondary to hypoxia and sepsis.  - CHADS vasc score at least 5 - Patient initially on Cardizem drip. Transitioned to metoprolol 50 mg BID by cardio on 09/12/2015. - No anticoagulation because of thrombocytopenia.  - Will check CBC today.       Troponin level elevated - Secondary to demand ischemia from hypoxia and sepsis. - No further ischemic work up required per cardiology - Trop levels stable    Hyperlipidemia - Continue Lipitor   Systolic and diastolic CHF, chronic / Automatic implantable cardioverter-defibrillator in situ / Coronary artery disease-status post CABG - Patient had 2 D ECHO in08/2016 with EF 30-35% with grade 2 diastolic dysfunction  - Has ICD   Near syncope - Likely due to hypoxia, a fib - PT eval pending    Gastroesophageal cancer - Managed by Dr. Benay Spice. - Last chemo 09/03/15 - Continue RT, tolerating well    CKD (chronic kidney disease) stage 3, GFR 30-59 ml/min - Baseline  creatinine 1.6 about 8 years ago - Renal function stable    Anemia of chronic disease - Anemia secondary to combination of chronic kidney disease, history of malignancy and sequela of recent chemotherapy. The last chemotherapy infusion was 09/03/2015.  - Hemoglobin stable - No current indications for  transfusion    Leukopenia due to antineoplastic chemotherapy / Thrombocytopenia / Pancytopenia due to antineoplastic chemotherapy - Pancytopenia secondary to recent chemotherapy (last infusion received 09/03/2015) - Check CBC today.    Type 2 diabetes mellitus with renal manifestations - CBG's in past 24 hours: 137, 141, 115 - Use glipizide and SSI only - Home insulin regimen on hold because of god glycemic control so far. Also, he is not eating well so SSI is good for now.   Hypothyroidism - Continue synthroid - TSH WNL   Gout - Continue allopurinol   DVT prophylaxis:  - Continue SCD's bilaterally due to thrombocytopenia    Code Status: Full.  Family Communication:  plan of care discussed with the patient Disposition Plan: transfer to telemetry today   IV access:  Peripheral IV  Procedures and diagnostic studies:    Ct Angio Chest Pe W/cm &/or Wo Cm 09/10/2015  No acute cardiopulmonary process.  Mild bronchiectasis. Lingular volume loss and scarring or atelectasis.  Mild esophageal distention by fluid and periesophageal/retrocrural lymphadenopathy compatible with the provided history of esophageal carcinoma.   Electronically Signed   By: Conchita Paris M.D.   On: 09/10/2015 19:41   Dg Chest Port 1 View 09/10/2015    Mild left lower lobe atelectasis.   Electronically Signed   By: Franchot Gallo M.D.   On: 09/10/2015 14:00   Medical Consultants:  Cardiology Oncology - Dr. Benay Spice   Other Consultants:  Physical therapy Nutrition  IAnti-Infectives:   Vancomycin 09/10/2015 --> 09/12/2015 Zosyn 09/10/2015 --> 09/12/2015 Levaquin 09/12/2015 -->    Leisa Lenz, MD  [facility] Pager [phone]  Time spent in minutes: 25 minutes  If 7PM-7AM, please contact night-coverage [email or web address] Password [email or web address] 09/12/2015, 9:36 AM   LOS: 2 days    HPI/Subjective: No acute overnight events. Patient reports feeling weak.  Objective: Filed Vitals:   09/12/15 0400  09/12/15 0500 09/12/15 0600 09/12/15 0800  BP: 80/34  130/53 138/92  Pulse: 136  95   Temp:    98.8 F (37.1 C)  TempSrc:    Oral  Resp: _0 Height:      Weight:  105 kg (231 lb 7.7 oz)    SpO2: 94%  94% 96%    Intake/Output Summary (Last 24 hours) at 09/12/15 0936 Last data filed at 09/12/15 0600  Gross per 24 hour  Intake   1020 ml  Output    400 ml  Net    620 ml    Exam:   General:  Pt is alert, not in acute distress  Cardiovascular: tachycardic, (+)S1, S2   Respiratory: no wheezing, no rhonchi   Abdomen: non tender, (+) BS  Extremities: No leg swelling, palpable pulses   Neuro: Nonfocal  Data Reviewed: Basic Metabolic Panel:  Recent Labs Lab 09/09/15 1025 09/10/15 1336 09/11/15 0345  NA 137 134* 139  K 4.4 3.5 3.6  CL  --  105 111  CO2 _1 GLUCOSE 144* 142* 116*  BUN 11._2 CREATININE 1.1 1.29* 0.82  CALCIUM 9.6 8.9 8.5*   Liver Function Tests:  Recent Labs Lab 09/09/15 1025 09/10/15 1336 09/11/15 0345  AST 20 26  19  ALT <9 10* 8*  ALKPHOS 109 93 73  BILITOT 1.80* 1.9* 1.3*  PROT 6.7 6.8 5.7*  ALBUMIN 3.4* 3.5 2.9*   No results for input(s): LIPASE, AMYLASE in the last 168 hours. No results for input(s): AMMONIA in the last 168 hours. CBC:  Recent Labs Lab 09/09/15 1025 09/10/15 1336 09/11/15 0345  WBC 3.0* 3.9* 2.3*  NEUTROABS 2.3 2.9  --   HGB 12.6* 12.3* 10.1*  HCT 36.0* 35.9* 29.5*  MCV 82.0 82.5 83.3  PLT 68* 100* 63*   Cardiac Enzymes:  Recent Labs Lab 09/10/15 2125 09/11/15 0115 09/11/15 0718  TROPONINI 0.06* 0.05* 0.04*   BNP: Invalid input(s): POCBNP CBG:  Recent Labs Lab 09/11/15 0757 09/11/15 1247 09/11/15 1716 09/11/15 2143 09/12/15 0733  GLUCAP 117* 145* 137* 141* 115*    Recent Results (from the past 240 hour(s))  MRSA PCR Screening     Status: None   Collection Time: 09/10/15  8:37 PM  Result Value Ref Range Status   MRSA by PCR NEGATIVE NEGATIVE Final     Scheduled  Meds: . allopurinol  300 mg Oral Daily  . aspirin EC  81 mg Oral Daily  . atorvastatin  40 mg Oral q1800  . famotidine  20 mg Oral Daily  . glipiZIDE  10 mg Oral BID AC  . Influenza vac split quadrivalent PF  0.5 mL Intramuscular Tomorrow-1000  . insulin aspart  0-15 Units Subcutaneous TID WC  . insulin aspart  0-5 Units Subcutaneous QHS  . ipratropium  0.5 mg Nebulization QID  . levalbuterol  1.25 mg Nebulization QID  . levothyroxine  75 mcg Oral QAC breakfast  . metoprolol tartrate  25 mg Oral NOW  . metoprolol tartrate  50 mg Oral BID  . sodium chloride  3 mL Intravenous Q12H  . sucralfate  1 g Oral QID   Continuous Infusions: . sodium chloride 10 mL/hr at 09/11/15 0700

## 2015-09-12 NOTE — Progress Notes (Signed)
SUBJECTIVE:  No complaints  OBJECTIVE:   Vitals:   Filed Vitals:   09/12/15 0400 09/12/15 0500 09/12/15 0600 09/12/15 0800  BP: 80/34  130/53 138/92  Pulse: 136  95   Temp:    98.8 F (37.1 C)  TempSrc:    Oral  Resp: 5  26 28   Height:      Weight:  231 lb 7.7 oz (105 kg)    SpO2: 94%  94% 96%   I&O's:   Intake/Output Summary (Last 24 hours) at 09/12/15 0853 Last data filed at 09/12/15 0600  Gross per 24 hour  Intake   1020 ml  Output    400 ml  Net    620 ml   TELEMETRY: Reviewed telemetry pt in atrial fibrillation with RVR:     PHYSICAL EXAM General: Well developed, well nourished, in no acute distress Head: Eyes PERRLA, No xanthomas.   Normal cephalic and atramatic  Lungs:   Clear bilaterally to auscultation and percussion. Heart:   Irregularly irregular S1 S2 Pulses are 2+ & equal. Abdomen: Bowel sounds are positive, abdomen soft and non-tender without masses Extremities:   No clubbing, cyanosis or edema.  DP +1 Neuro: Alert and oriented X 3. Psych:  Good affect, responds appropriately   LABS: Basic Metabolic Panel:  Recent Labs  09/10/15 1336 09/11/15 0345  NA 134* 139  K 3.5 3.6  CL 105 111  CO2 24 22  GLUCOSE 142* 116*  BUN 16 14  CREATININE 1.29* 0.82  CALCIUM 8.9 8.5*   Liver Function Tests:  Recent Labs  09/10/15 1336 09/11/15 0345  AST 26 19  ALT 10* 8*  ALKPHOS 93 73  BILITOT 1.9* 1.3*  PROT 6.8 5.7*  ALBUMIN 3.5 2.9*   No results for input(s): LIPASE, AMYLASE in the last 72 hours. CBC:  Recent Labs  09/09/15 1025 09/10/15 1336 09/11/15 0345  WBC 3.0* 3.9* 2.3*  NEUTROABS 2.3 2.9  --   HGB 12.6* 12.3* 10.1*  HCT 36.0* 35.9* 29.5*  MCV 82.0 82.5 83.3  PLT 68* 100* 63*   Cardiac Enzymes:  Recent Labs  09/10/15 2125 09/11/15 0115 09/11/15 0718  TROPONINI 0.06* 0.05* 0.04*   BNP: Invalid input(s): POCBNP D-Dimer: No results for input(s): DDIMER in the last 72 hours. Hemoglobin A1C: No results for input(s):  HGBA1C in the last 72 hours. Fasting Lipid Panel: No results for input(s): CHOL, HDL, LDLCALC, TRIG, CHOLHDL, LDLDIRECT in the last 72 hours. Thyroid Function Tests:  Recent Labs  09/10/15 1844  TSH 0.461   Anemia Panel: No results for input(s): VITAMINB12, FOLATE, FERRITIN, TIBC, IRON, RETICCTPCT in the last 72 hours. Coag Panel:   Lab Results  Component Value Date   INR 1.06 09/10/2015   INR 1.05 07/20/2015   INR 1.00 07/29/2013    RADIOLOGY: Ct Angio Chest Pe W/cm &/or Wo Cm  09/10/2015   CLINICAL DATA:  Presyncope, esophageal carcinoma  EXAM: CT ANGIOGRAPHY CHEST WITH CONTRAST  TECHNIQUE: Multidetector CT imaging of the chest was performed using the standard protocol during bolus administration of intravenous contrast. Multiplanar CT image reconstructions and MIPs were obtained to evaluate the vascular anatomy.  CONTRAST:  1102mL OMNIPAQUE IOHEXOL 350 MG/ML SOLN  COMPARISON:  Chest radiograph same date, PET-CT 08/07/2015  FINDINGS: Mediastinum/Nodes: Left-sided AICD in place. Evidence of CABG. Great vessels are normal in caliber. Moderate atheromatous coronary arterial and aortic calcification.  Mild esophageal distention is identified. The previously evaluated mass of the distal esophagus is not specifically re-  visualized without oral contrast today.  Periesophageal lymph node measures 0.8 cm in maximal short axis diameter image 53. Retrocrural distal esophageal node measures 1.0 cm image 67.  Heart size is mildly enlarged.  No pericardial effusion.  The examination is adequate for evaluation for acute pulmonary embolism up to and including the 3rd order pulmonary arteries. No focal filling defect is identified up to and including the 3rd order pulmonary arteries to suggest acute pulmonary embolism.  Lungs/Pleura: Minimal bibasilar dependent subpleural atelectasis. Bibasilar bronchiectasis is noted. Lingular volume loss. No measurable nodule, consolidation, or mass.  Central airways are  patent.  Upper abdomen: Otherwise normal, please see comments above.  Musculoskeletal: No acute osseous abnormality or lytic or sclerotic osseous lesion.  Review of the MIP images confirms the above findings.  IMPRESSION: No acute cardiopulmonary process.  Mild bronchiectasis. Lingular volume loss and scarring or atelectasis.  Mild esophageal distention by fluid and periesophageal/retrocrural lymphadenopathy compatible with the provided history of esophageal carcinoma.   Electronically Signed   By: Conchita Paris M.D.   On: 09/10/2015 19:41   Dg Chest Port 1 View  09/10/2015   CLINICAL DATA:  Dizziness. Currently receiving treatment for esophageal carcinoma  EXAM: PORTABLE CHEST 1 VIEW  COMPARISON:  07/20/2015  FINDINGS: Heart size upper normal. Postop CABG. AICD unchanged in position. Negative for heart failure  Mild left lower lobe atelectasis has progressed in the interval. Negative for pneumonia or effusion. No mass or lung nodule.  IMPRESSION: Mild left lower lobe atelectasis.   Electronically Signed   By: Franchot Gallo M.D.   On: 09/10/2015 14:00    ASSESSMENT AND PLAN  Principal Problem:  Acute respiratory failure with hypoxia Active Problems:  Hyperlipidemia  Coronary artery disease-status post CABG  Automatic implantable cardioverter-defibrillator in situ  Systolic and diastolic CHF, chronic  Near syncope  Gastroesophageal cancer  Atrial fibrillation  CKD (chronic kidney disease) stage 3, GFR 30-59 ml/min  Anemia of chronic disease  Leukopenia due to antineoplastic chemotherapy  Thrombocytopenia  Pancytopenia due to antineoplastic chemotherapy  Type 2 diabetes mellitus with renal manifestations  Hypothyroidism  Gout  Sepsis  1. Atrial Fibrillation w/ RVR: still in afib despite IV Cardizem. His rate remains elevated in the 110s-130s. BP is stable and he is tolerating ok at this point. K is stable and TSH is normal. His afib is likely triggered by his acute  sepsis. Continue treatement of underlying medical issues. His CHA2DS2 VASc score is high at 5 (HTN, CHF, DM, Age 14-74 and Vascular disease).  He has underlying thrombocytopenia but this has been stable.  Would recommend starting NOAC if IM feels bleeding risk is low and platelets remain stable.  Recheck platelets today.  Rate still elevated - change Lopressor to 50mg  BID.   2. Abnormal Troponin: Troponin with flat low level trend, 0.06, 0.05, 0.04. Likely secondary to rapid afib and sepsis. No indication for any ischemic w/u given recent cath.  3. CAD: s/p remote CABG x 3. Recent LHC 07/2015 showed stable anatomy with 3/3 patent grafts. Denies CP. Continue medical therapy.   4. Ischemic Cardiomyopathy: EF 30-35% by recent echo 07/2015. He has an ICD.   5. Chronic Combined Systolic + Diastolic CHF: volume appears stable.   6. ICD: followed by Dr. Caryl Comes  7. HTN: well controlled on current regimen   8. HLD: on Lipitor.   9. DM: per primary  10. Gastroesophageal Cancer: undergoing chem. Continue treatment per oncology.   11. CKD: renal function currently stable. SCr  0.82.   12. Thrombocytopenia: platelets continue to downtrend from 100 down to 63 today. Monitor closely.     Walter Margarita, MD  09/12/2015  8:53 AM

## 2015-09-12 NOTE — Progress Notes (Signed)
ANTIBIOTIC CONSULT NOTE  Pharmacy Consult for Levofloxacin Indication: rule out sepsis  Allergies  Allergen Reactions  . Adhesive [Tape]     Pulls skin off  . Morphine And Related Nausea And Vomiting    Patient Measurements: Height: 6' (182.9 cm) Weight: 231 lb 7.7 oz (105 kg) IBW/kg (Calculated) : 77.6  Vital Signs: Temp: 98.8 F (37.1 C) (10/01 0800) Temp Source: Oral (10/01 0800) BP: 138/92 mmHg (10/01 0800) Pulse Rate: 95 (10/01 0600) Intake/Output from previous day: 09/30 0701 - 10/01 0700 In: 1020 [P.O.:90; I.V.:230; IV Piggyback:700] Out: 400 [Urine:400] Intake/Output from this shift:    Labs:  Recent Labs  09/09/15 1025 09/09/15 1025 09/10/15 1336 09/11/15 0345  WBC 3.0*  --  3.9* 2.3*  HGB 12.6*  --  12.3* 10.1*  PLT 68*  --  100* 63*  CREATININE  --  1.1 1.29* 0.82   Estimated Creatinine Clearance: 111.1 mL/min (by C-G formula based on Cr of 0.82). No results for input(s): VANCOTROUGH, VANCOPEAK, VANCORANDOM, GENTTROUGH, GENTPEAK, GENTRANDOM, TOBRATROUGH, TOBRAPEAK, TOBRARND, AMIKACINPEAK, AMIKACINTROU, AMIKACIN in the last 72 hours.   Microbiology: Recent Results (from the past 720 hour(s))  Blood Culture (routine x 2)     Status: None (Preliminary result)   Collection Time: 09/10/15  1:37 PM  Result Value Ref Range Status   Specimen Description BLOOD LEFT ANTECUBITAL  Final   Special Requests BOTTLES DRAWN AEROBIC AND ANAEROBIC 5ML  Final   Culture   Final    NO GROWTH < 24 HOURS Performed at [facility]    Report Status PENDING  Incomplete  Blood Culture (routine x 2)     Status: None (Preliminary result)   Collection Time: 09/10/15  1:37 PM  Result Value Ref Range Status   Specimen Description BLOOD LEFT ARM  Final   Special Requests BOTTLES DRAWN AEROBIC AND ANAEROBIC 8CC  Final   Culture   Final    NO GROWTH < 24 HOURS Performed at [facility]    Report Status PENDING  Incomplete  MRSA PCR Screening     Status: None    Collection Time: 09/10/15  8:37 PM  Result Value Ref Range Status   MRSA by PCR NEGATIVE NEGATIVE Final    Comment:        The GeneXpert MRSA Assay (FDA approved for NASAL specimens only), is one component of a comprehensive MRSA colonization surveillance program. It is not intended to diagnose MRSA infection nor to guide or monitor treatment for MRSA infections.   Culture, Urine     Status: None   Collection Time: 09/11/15  1:10 AM  Result Value Ref Range Status   Specimen Description URINE, RANDOM  Final   Special Requests NONE  Final   Culture   Final    NO GROWTH 1 DAY Performed at [facility]    Report Status 09/12/2015 FINAL  Final    Medications:  Scheduled:  . allopurinol  300 mg Oral Daily  . aspirin EC  81 mg Oral Daily  . atorvastatin  40 mg Oral q1800  . famotidine  20 mg Oral Daily  . glipiZIDE  10 mg Oral BID AC  . Influenza vac split quadrivalent PF  0.5 mL Intramuscular Tomorrow-1000  . insulin aspart  0-15 Units Subcutaneous TID WC  . insulin aspart  0-5 Units Subcutaneous QHS  . ipratropium  0.5 mg Nebulization QID  . levalbuterol  1.25 mg Nebulization QID  . levothyroxine  75 mcg Oral QAC breakfast  .  metoprolol tartrate  25 mg Oral NOW  . metoprolol tartrate  50 mg Oral BID  . sodium chloride  3 mL Intravenous Q12H  . sucralfate  1 g Oral QID   Infusions:  . sodium chloride 10 mL/hr at 09/11/15 0700   Assessment: 69 yo presented to ED 9/29 with CC dizziness. PMH inclues CHF, adenocarcinoma of lung currently being treated with chemotherapy, DM, gout and HTN. To start vancomycin and Zosyn for possible sepsis. Sepsis criteria met on admission, and patient started on broad spectrum antibiotics, Vancomycin and Zosyn. Now transitioning to Levofloxacin.   9/29 >> Ceftriaxone >> 9/29 9/29 >> Vanc >> 10/1 9/29 >> Zosyn >> 10/1 10/1 >> Levofloxacin >>  Tmax 99.4 F WBC falling, 2.3K (9/30) SCr improved to 0.82 (9/30) with CrCl > 100 ml/min  CG  9/29 blood x 2: NGTD 9/30 urine: NGF  Goal of Therapy:  Appropriate antibiotic dosing for renal function, indication Eradication of infection  Plan:   Levofloxacin 750 mg IV q24h.  Follow up renal function, cultures, clinical course, duration of therapy.  If/when converted to PO, ensure Levofloxacin administered at least 2 hours before or 4 hours after Sucralfate (Sucralfate can decrease absorption of oral Levofloxacin due to chelation).   Lindell Spar, PharmD, BCPS Pager: [phone] 09/12/2015 10:08 AM

## 2015-09-13 DIAGNOSIS — I4891 Unspecified atrial fibrillation: Secondary | ICD-10-CM | POA: Insufficient documentation

## 2015-09-13 DIAGNOSIS — I5042 Chronic combined systolic (congestive) and diastolic (congestive) heart failure: Secondary | ICD-10-CM

## 2015-09-13 LAB — GLUCOSE, CAPILLARY
GLUCOSE-CAPILLARY: 134 mg/dL — AB (ref 65–99)
GLUCOSE-CAPILLARY: 92 mg/dL (ref 65–99)
Glucose-Capillary: 141 mg/dL — ABNORMAL HIGH (ref 65–99)

## 2015-09-13 LAB — BASIC METABOLIC PANEL
Anion gap: 4 — ABNORMAL LOW (ref 5–15)
BUN: 11 mg/dL (ref 6–20)
CHLORIDE: 107 mmol/L (ref 101–111)
CO2: 25 mmol/L (ref 22–32)
Calcium: 8.7 mg/dL — ABNORMAL LOW (ref 8.9–10.3)
Creatinine, Ser: 0.88 mg/dL (ref 0.61–1.24)
GFR calc Af Amer: >60 mL/min (ref >60)
GFR calc non Af Amer: >60 mL/min (ref >60)
GLUCOSE: 135 mg/dL — AB (ref 65–99)
POTASSIUM: 3.9 mmol/L (ref 3.5–5.1)
Sodium: 136 mmol/L (ref 135–145)

## 2015-09-13 LAB — CBC
HEMATOCRIT: 30.9 % — AB (ref 39.0–52.0)
Hemoglobin: 10.5 g/dL — ABNORMAL LOW (ref 13.0–17.0)
MCH: 28.6 pg (ref 26.0–34.0)
MCHC: 34 g/dL (ref 30.0–36.0)
MCV: 84.2 fL (ref 78.0–100.0)
Platelets: 81 10*3/uL — ABNORMAL LOW (ref 150–400)
RBC: 3.67 MIL/uL — ABNORMAL LOW (ref 4.22–5.81)
RDW: 15.7 % — AB (ref 11.5–15.5)
WBC: 2.9 10*3/uL — ABNORMAL LOW (ref 4.0–10.5)

## 2015-09-13 MED ORDER — APIXABAN 5 MG PO TABS
5.0000 mg | ORAL_TABLET | Freq: Two times a day (BID) | ORAL | Status: DC
  Administered 2015-09-13 – 2015-09-15 (×5): 5 mg via ORAL
  Filled 2015-09-13 (×5): qty 1

## 2015-09-13 NOTE — Progress Notes (Addendum)
SUBJECTIVE:  No complaints  OBJECTIVE:   Vitals:   Filed Vitals:   09/13/15 0200 09/13/15 0323 09/13/15 0400 09/13/15 0800  BP: 87/61  134/79 126/93  Pulse: 114  121 71  Temp:   98.1 F (36.7 C) 98.6 F (37 C)  TempSrc:   Oral Oral  Resp: 26  22 37  Height:      Weight:  232 lb 5.8 oz (105.4 kg) 232 lb 5.8 oz (105.4 kg)   SpO2: 93%  92% 95%   I&O's:   Intake/Output Summary (Last 24 hours) at 09/13/15 0913 Last data filed at 09/13/15 0800  Gross per 24 hour  Intake    510 ml  Output      0 ml  Net    510 ml   TELEMETRY: Reviewed telemetry pt in atrial fibrillation with RVR:     PHYSICAL EXAM General: Well developed, well nourished, in no acute distress Head: Eyes PERRLA, No xanthomas.   Normal cephalic and atramatic  Lungs:   Clear bilaterally to auscultation and percussion. Heart:   Irregularly irregular and tachy S1 S2 Pulses are 2+ & equal. Abdomen: Bowel sounds are positive, abdomen soft and non-tender without masses Extremities:   No clubbing, cyanosis or edema.  DP +1 Neuro: Alert and oriented X 3. Psych:  Good affect, responds appropriately   LABS: Basic Metabolic Panel:  Recent Labs  09/11/15 0345 09/13/15 0341  NA 139 136  K 3.6 3.9  CL 111 107  CO2 22 25  GLUCOSE 116* 135*  BUN 14 11  CREATININE 0.82 0.88  CALCIUM 8.5* 8.7*   Liver Function Tests:  Recent Labs  09/10/15 1336 09/11/15 0345  AST 26 19  ALT 10* 8*  ALKPHOS 93 73  BILITOT 1.9* 1.3*  PROT 6.8 5.7*  ALBUMIN 3.5 2.9*   No results for input(s): LIPASE, AMYLASE in the last 72 hours. CBC:  Recent Labs  09/10/15 1336  09/12/15 1003 09/13/15 0341  WBC 3.9*  < > 2.6* 2.9*  NEUTROABS 2.9  --   --   --   HGB 12.3*  < > 10.7* 10.5*  HCT 35.9*  < > 31.6* 30.9*  MCV 82.5  < > 83.4 84.2  PLT 100*  < > 84* 81*  < > = values in this interval not displayed. Cardiac Enzymes:  Recent Labs  09/10/15 2125 09/11/15 0115 09/11/15 0718  TROPONINI 0.06* 0.05* 0.04*    BNP: Invalid input(s): POCBNP D-Dimer: No results for input(s): DDIMER in the last 72 hours. Hemoglobin A1C: No results for input(s): HGBA1C in the last 72 hours. Fasting Lipid Panel: No results for input(s): CHOL, HDL, LDLCALC, TRIG, CHOLHDL, LDLDIRECT in the last 72 hours. Thyroid Function Tests:  Recent Labs  09/10/15 1844  TSH 0.461   Anemia Panel: No results for input(s): VITAMINB12, FOLATE, FERRITIN, TIBC, IRON, RETICCTPCT in the last 72 hours. Coag Panel:   Lab Results  Component Value Date   INR 1.06 09/10/2015   INR 1.05 07/20/2015   INR 1.00 07/29/2013    RADIOLOGY: Ct Angio Chest Pe W/cm &/or Wo Cm  09/10/2015   CLINICAL DATA:  Presyncope, esophageal carcinoma  EXAM: CT ANGIOGRAPHY CHEST WITH CONTRAST  TECHNIQUE: Multidetector CT imaging of the chest was performed using the standard protocol during bolus administration of intravenous contrast. Multiplanar CT image reconstructions and MIPs were obtained to evaluate the vascular anatomy.  CONTRAST:  160mL OMNIPAQUE IOHEXOL 350 MG/ML SOLN  COMPARISON:  Chest radiograph same date, PET-CT  08/07/2015  FINDINGS: Mediastinum/Nodes: Left-sided AICD in place. Evidence of CABG. Great vessels are normal in caliber. Moderate atheromatous coronary arterial and aortic calcification.  Mild esophageal distention is identified. The previously evaluated mass of the distal esophagus is not specifically re- visualized without oral contrast today.  Periesophageal lymph node measures 0.8 cm in maximal short axis diameter image 53. Retrocrural distal esophageal node measures 1.0 cm image 67.  Heart size is mildly enlarged.  No pericardial effusion.  The examination is adequate for evaluation for acute pulmonary embolism up to and including the 3rd order pulmonary arteries. No focal filling defect is identified up to and including the 3rd order pulmonary arteries to suggest acute pulmonary embolism.  Lungs/Pleura: Minimal bibasilar dependent  subpleural atelectasis. Bibasilar bronchiectasis is noted. Lingular volume loss. No measurable nodule, consolidation, or mass.  Central airways are patent.  Upper abdomen: Otherwise normal, please see comments above.  Musculoskeletal: No acute osseous abnormality or lytic or sclerotic osseous lesion.  Review of the MIP images confirms the above findings.  IMPRESSION: No acute cardiopulmonary process.  Mild bronchiectasis. Lingular volume loss and scarring or atelectasis.  Mild esophageal distention by fluid and periesophageal/retrocrural lymphadenopathy compatible with the provided history of esophageal carcinoma.   Electronically Signed   By: Conchita Paris M.D.   On: 09/10/2015 19:41   Dg Chest Port 1 View  09/10/2015   CLINICAL DATA:  Dizziness. Currently receiving treatment for esophageal carcinoma  EXAM: PORTABLE CHEST 1 VIEW  COMPARISON:  07/20/2015  FINDINGS: Heart size upper normal. Postop CABG. AICD unchanged in position. Negative for heart failure  Mild left lower lobe atelectasis has progressed in the interval. Negative for pneumonia or effusion. No mass or lung nodule.  IMPRESSION: Mild left lower lobe atelectasis.   Electronically Signed   By: Franchot Gallo M.D.   On: 09/10/2015 14:00    ASSESSMENT AND PLAN  Principal Problem:  Acute respiratory failure with hypoxia Active Problems:  Hyperlipidemia  Coronary artery disease-status post CABG  Automatic implantable cardioverter-defibrillator in situ  Systolic and diastolic CHF, chronic  Near syncope  Gastroesophageal cancer  Atrial fibrillation  CKD (chronic kidney disease) stage 3, GFR 30-59 ml/min  Anemia of chronic disease  Leukopenia due to antineoplastic chemotherapy  Thrombocytopenia  Pancytopenia due to antineoplastic chemotherapy  Type 2 diabetes mellitus with renal manifestations  Hypothyroidism  Gout  Sepsis  1. Atrial Fibrillation w/ RVR: still in afib despite IV Cardizem. His rate remains elevated  in the 110s-130s. BP is stable and he is tolerating ok at this point. K is stable and TSH is normal. His afib is likely triggered by his acute sepsis. Continue treatement of underlying medical issues. His CHA2DS2 VASc score is high at 5 (HTN, CHF, DM, Age 2-74 and Vascular disease). He has underlying thrombocytopenia but this has been stable. Discussed with Dr. Charlies Silvers and she feels it is safe to start Eliquis 5mg  BID.  Follow platelets closely. Rate still elevated - change Lopressor to 50mg  BID. Continue dig that was started yesterday.  If HR remains elevated then will increase BB further if BP tolerates.  If BP drops may need to add IV Amio for rate control.   2. Abnormal Troponin: Troponin with flat low level trend, 0.06, 0.05, 0.04. Likely secondary to rapid afib and sepsis. No indication for any ischemic w/u given recent cath.  3. CAD: s/p remote CABG x 3. Recent LHC 07/2015 showed stable anatomy with 3/3 patent grafts. Denies CP. Continue medical therapy.   4.  Ischemic Cardiomyopathy: EF 30-35% by recent echo 07/2015. He has an ICD.   5. Chronic Combined Systolic + Diastolic CHF: volume appears stable.   6. ICD: followed by Dr. Caryl Comes  7. HTN: well controlled on current regimen   8. HLD: on Lipitor.   9. DM: per primary  10. Gastroesophageal Cancer: undergoing chem. Continue treatment per oncology.   11. CKD: renal function currently stable. SCr 0.82.   12. Thrombocytopenia: platelets improved today. Monitor closely.       Sueanne Margarita, MD  09/13/2015  9:13 AM

## 2015-09-13 NOTE — Progress Notes (Signed)
Patient ID: Walter French, male   DOB: 1946-07-26, 69 y.o.   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 Patient was just seen in cancer center 09/09/2015 for blood work and oncology decided that his next chemo will be rescheduled for 09/17/2015 due to thrombocytopenia.   Patient presented to [facility] ED with repots of dizzy spells, seeing spots and almost passing out.   In ED, BP was 121/59, HR 125, RR 19, T max 98 F and oxygen saturation as low as 79% on room air. This has improved with [place] oxygen support to 100%. The 12 lead EKG showed atrial fibrillation. He was started on Cardizem drip. The blood work was notable for WBC count of 3.9, hemoglobin of 12.3, platelets of 100, creatinine of 1.29. CXR showed mild left lower lobe atelectasis. Lactic acid was 2.18. Patient was given empiric Rocephin in ED. Because he met sepsis criteria antibiotics were broadened to Zosyn and vancomycin. CT angiogram of the chest did not reveal pulmonary embolism, no acute cardiopulmonary process.  Transfer to telemetry today 10/2.  Barrier to discharge: Will start apixaban today. Cardio to adjust metoprolol so heart rate controlled better.     Assessment/Plan:    Principal Problem:  Acute respiratory failure with hypoxia - Pt presented with hypoxia on admission. CXR did not reveal acute cardiopulmonary findings. CT chest did not reveal acute pulmonary embolism. - Stable respiratory  status.  - Continue current nebulizer treatments.  - Transfer to telemetry today.  - Continue oxygen support via nasal cannula to keep oxygen saturation above 90%.   Active Problems:  Sepsis, unspecified organism - Sepsis criteria met on the admission ( tachycardia, tachypnea, hypoxia, leukopenia and lactic acidosis). Procalcitonin elevated at 0.13 on admission - No clear source of infection. Started on vanco and zosyn, narrowed down to Levaquin 10/1. - Continue abx for total of 7 days (today is day # 4/7) - Blood cultures so far show no growth.    Atrial fibrillation, new onset - Secondary to hypoxia and sepsis.  - CHADS vasc score at least 5. - Has required Cardizem drip - Now on PO meds per cardio, metoprolol 50 mg BID - Start apixaban today per cardio. Monitor for bleeds.       Troponin level elevated - Secondary to demand ischemia from hypoxia and sepsis. - Trop stable - Cardio on board - No reports of chest pain    Hyperlipidemia - Continue Lipitor   Systolic and diastolic CHF, chronic / Automatic implantable cardioverter-defibrillator in situ / Coronary artery disease-status post CABG - 2 D ECHO in08/2016 with EF 30-35% with grade 2 diastolic dysfunction  - S/P ICD   Near syncope - Feels better - PT eval requested    Gastroesophageal cancer - Managed by Dr. Benay Spice. - Last chemo 09/03/15 - Continue RT as planned    CKD (chronic kidney disease) stage 3, GFR 30-59 ml/min - Baseline creatinine 1.6 about 8 years ago - Creatinine stable    Anemia of chronic disease - Anemia secondary to chronic kidney disease, history of malignancy and sequela  of recent chemotherapy.  - Continue daily CBC monitor    Leukopenia due to antineoplastic chemotherapy / Thrombocytopenia / Pancytopenia due to antineoplastic chemotherapy - Pancytopenia secondary to recent chemotherapy (last infusion received 09/03/2015) - WBC count stable at 2.9, hgb 10.5 and platelets 81.     Type 2 diabetes mellitus with renal manifestations - Continue glipizide and SSI only - Home insulin regimen on hold since he is not eating so good and CBG's under pretty good control.    Hypothyroidism - Continue synthroid - TSH WNL   Gout - We will continue allopurinol    DVT prophylaxis:  - Continue SCD's - Will start anticoagulation with apixaban    Code Status: Full.  Family Communication:  plan of care discussed with the patient Disposition Plan: transfer to telemetry today   IV access:  Peripheral IV  Procedures and diagnostic studies:    Ct Angio Chest Pe W/cm &/or Wo Cm 09/10/2015  No acute cardiopulmonary process.  Mild bronchiectasis. Lingular volume loss and scarring or atelectasis.  Mild esophageal distention by fluid and periesophageal/retrocrural lymphadenopathy compatible with the provided history of esophageal carcinoma.   Electronically Signed   By: Conchita Paris M.D.   On: 09/10/2015 19:41   Dg Chest Port 1 View 09/10/2015    Mild left lower lobe atelectasis.   Electronically Signed   By: Franchot Gallo M.D.   On: 09/10/2015 14:00   Medical Consultants:  Cardiology Oncology - Dr. Benay Spice   Other Consultants:  Physical therapy Nutrition  IAnti-Infectives:   Vancomycin 09/10/2015 --> 09/12/2015 Zosyn 09/10/2015 --> 09/12/2015 Levaquin 09/12/2015 -->    Leisa Lenz, MD  [facility] Pager [phone]  Time spent in minutes: 25 minutes  If 7PM-7AM, please contact night-coverage [email or web address] Password [email or web address] 09/13/2015, 11:27 AM   LOS: 3 days    HPI/Subjective: No acute overnight events. Patient wants to go home.  Objective: Filed Vitals:   09/13/15 0200 09/13/15 0323 09/13/15 0400 09/13/15 0800  BP: 87/61  134/79 126/93  Pulse: 114  121 71  Temp:   98.1 F (36.7 C) 98.6 F (37 C)  TempSrc:   Oral Oral  Resp: 26  22 37  Height:      Weight:  105.4 kg (232 lb 5.8 oz) 105.4 kg (232 lb 5.8 oz)   SpO2: 93%  92% 95%    Intake/Output  Summary (Last 24 hours) at 09/13/15 1127 Last data filed at 09/13/15 0800  Gross per 24 hour  Intake    370 ml  Output      0 ml  Net    370 ml    Exam:   General:  Pt is alert, awake, oriented to time, place and person   Cardiovascular: irregular rhythm, tachycardic, (+) S1, S2   Respiratory: clear to auscultation bilaterally, no wheezing   Abdomen: obese, non tender, appreciate bowel sounds   Extremities: appreciate pulses bilaterally, no swelling   Neuro: no focal deficits   Data Reviewed: Basic Metabolic Panel:  Recent Labs Lab 09/09/15 1025 09/10/15 1336 09/11/15 0345 09/13/15 0341  NA 137 134* 139 136  K 4.4 3.5 3.6 3.9  CL  --  105 111 107  CO2 26 24 22 25   GLUCOSE 144* 142* 116* 135*  BUN 11.2 16 14 11   CREATININE 1.1 1.29* 0.82 0.88  CALCIUM 9.6 8.9 8.5* 8.7*   Liver Function Tests:  Recent Labs Lab 09/09/15 1025 09/10/15 1336 09/11/15 0345  AST 20 26 19   ALT <9 10* 8*  ALKPHOS 109 93 73  BILITOT 1.80* 1.9* 1.3*  PROT 6.7 6.8 5.7*  ALBUMIN 3.4* 3.5 2.9*   No results for input(s): LIPASE, AMYLASE in the last 168 hours. No results for input(s): AMMONIA in the last 168 hours. CBC:  Recent Labs Lab 09/09/15 1025 09/10/15 1336 09/11/15 0345 09/12/15 1003 09/13/15 0341  WBC 3.0* 3.9* 2.3* 2.6* 2.9*  NEUTROABS 2.3 2.9  --   --   --   HGB 12.6* 12.3* 10.1* 10.7* 10.5*  HCT 36.0* 35.9* 29.5* 31.6* 30.9*  MCV 82.0 82.5 83.3 83.4 84.2  PLT 68* 100* 63* 84* 81*   Cardiac Enzymes:  Recent Labs Lab 09/10/15 2125 09/11/15 0115 09/11/15 0718  TROPONINI 0.06* 0.05* 0.04*   BNP: Invalid input(s): POCBNP CBG:  Recent Labs Lab 09/12/15 0733 09/12/15 1135 09/12/15 1546 09/12/15 2108 09/13/15 0824  GLUCAP 115* 93 209* 70 141*    Recent Results (from the past 240 hour(s))  MRSA PCR Screening     Status: None   Collection Time: 09/10/15  8:37 PM  Result Value Ref Range Status   MRSA by PCR NEGATIVE NEGATIVE Final     Scheduled  Meds: . allopurinol  300 mg Oral Daily  . apixaban  5 mg Oral BID  . aspirin EC  81 mg Oral Daily  . atorvastatin  40 mg Oral q1800  . digoxin  0.25 mg Oral Daily  . famotidine  20 mg Oral Daily  . glipiZIDE  10 mg Oral BID AC  . insulin aspart  0-15 Units Subcutaneous TID WC  . insulin aspart  0-5 Units Subcutaneous QHS  . levofloxacin (LEVAQUIN) IV  750 mg Intravenous Q24H  . levothyroxine  75 mcg Oral QAC breakfast  . metoprolol tartrate  50 mg Oral BID  . sodium chloride  3 mL Intravenous Q12H  . sucralfate  1 g Oral QID   Continuous Infusions: . sodium chloride 10 mL (09/12/15 2023)

## 2015-09-14 ENCOUNTER — Ambulatory Visit
Admission: RE | Admit: 2015-09-14 | Discharge: 2015-09-14 | Disposition: A | Discharge status: Home / Self Care | Payer: Medicare Other | Source: Ambulatory Visit | Attending: Radiation Oncology | Admitting: Radiation Oncology

## 2015-09-14 ENCOUNTER — Telehealth: Payer: Self-pay | Admitting: *Deleted

## 2015-09-14 DIAGNOSIS — I480 Paroxysmal atrial fibrillation: Secondary | ICD-10-CM

## 2015-09-14 LAB — GLUCOSE, CAPILLARY
GLUCOSE-CAPILLARY: 80 mg/dL (ref 65–99)
Glucose-Capillary: 117 mg/dL — ABNORMAL HIGH (ref 65–99)
Glucose-Capillary: 136 mg/dL — ABNORMAL HIGH (ref 65–99)
Glucose-Capillary: 156 mg/dL — ABNORMAL HIGH (ref 65–99)
Glucose-Capillary: 110 mg/dL — ABNORMAL HIGH (ref 65–99)

## 2015-09-14 LAB — CBC
HEMATOCRIT: 28 % — AB (ref 39.0–52.0)
HEMOGLOBIN: 9.6 g/dL — AB (ref 13.0–17.0)
MCH: 28.4 pg (ref 26.0–34.0)
MCHC: 34.3 g/dL (ref 30.0–36.0)
MCV: 82.8 fL (ref 78.0–100.0)
Platelets: 69 10*3/uL — ABNORMAL LOW (ref 150–400)
RBC: 3.38 MIL/uL — ABNORMAL LOW (ref 4.22–5.81)
RDW: 15.6 % — AB (ref 11.5–15.5)
WBC: 3.6 10*3/uL — ABNORMAL LOW (ref 4.0–10.5)

## 2015-09-14 LAB — BASIC METABOLIC PANEL
ANION GAP: 4 — AB (ref 5–15)
BUN: 10 mg/dL (ref 6–20)
CALCIUM: 8.5 mg/dL — AB (ref 8.9–10.3)
CO2: 26 mmol/L (ref 22–32)
Chloride: 106 mmol/L (ref 101–111)
Creatinine, Ser: 0.92 mg/dL (ref 0.61–1.24)
GFR calc Af Amer: >60 mL/min (ref >60)
GLUCOSE: 113 mg/dL — AB (ref 65–99)
POTASSIUM: 3.7 mmol/L (ref 3.5–5.1)
Sodium: 136 mmol/L (ref 135–145)

## 2015-09-14 NOTE — Progress Notes (Signed)
Patient ID: Walter French, male   DOB: 1946-11-14, 69 y.o.   MRN: [id number]   SUBJECTIVE:  No complaints  OBJECTIVE:   Vitals:   Filed Vitals:   09/14/15 0609 09/14/15 0913 09/14/15 1328 09/14/15 1550  BP: 173/88 145/78 124/65   Pulse: 117 114 123 112  Temp: 98 F (36.7 C)  98.2 F (36.8 C)   TempSrc: Oral  Oral   Resp: 20  20   Height:      Weight: 105.5 kg (232 lb 9.4 oz)     SpO2: 98%  99%    I&O's:    Intake/Output Summary (Last 24 hours) at 09/14/15 1705 Last data filed at 09/14/15 0819  Gross per 24 hour  Intake    363 ml  Output      0 ml  Net    363 ml   TELEMETRY: Reviewed telemetry pt in atrial fibrillation with RVR:     PHYSICAL EXAM General: Well developed, well nourished, in no acute distress Head: Eyes PERRLA, No xanthomas.   Normal cephalic and atramatic  Lungs:   Clear bilaterally to auscultation and percussion. Heart:   Irregularly irregular and tachy S1 S2 Pulses are 2+ & equal. Abdomen: Bowel sounds are positive, abdomen soft and non-tender without masses Extremities:   No clubbing, cyanosis or edema.  DP +1 Neuro: Alert and oriented X 3. Psych:  Good affect, responds appropriately   LABS: Basic Metabolic Panel:  Recent Labs  09/13/15 0341 09/14/15 0540  NA 136 136  K 3.9 3.7  CL 107 106  CO2 25 26  GLUCOSE 135* 113*  BUN 11 10  CREATININE 0.88 0.92  CALCIUM 8.7* 8.5*   CBC:  Recent Labs  09/13/15 0341 09/14/15 0540  WBC 2.9* 3.6*  HGB 10.5* 9.6*  HCT 30.9* 28.0*  MCV 84.2 82.8  PLT 81* 69*    Anemia Panel: No results for input(s): VITAMINB12, FOLATE, FERRITIN, TIBC, IRON, RETICCTPCT in the last 72 hours. Coag Panel:   Lab Results  Component Value Date   INR 1.06 09/10/2015   INR 1.05 07/20/2015   INR 1.00 07/29/2013    RADIOLOGY: Ct Angio Chest Pe W/cm &/or Wo Cm  09/10/2015   CLINICAL DATA:  Presyncope, esophageal carcinoma  EXAM: CT ANGIOGRAPHY CHEST WITH CONTRAST  TECHNIQUE: Multidetector CT imaging of the  chest was performed using the standard protocol during bolus administration of intravenous contrast. Multiplanar CT image reconstructions and MIPs were obtained to evaluate the vascular anatomy.  CONTRAST:  119mL OMNIPAQUE IOHEXOL 350 MG/ML SOLN  COMPARISON:  Chest radiograph same date, PET-CT 08/07/2015  FINDINGS: Mediastinum/Nodes: Left-sided AICD in place. Evidence of CABG. Great vessels are normal in caliber. Moderate atheromatous coronary arterial and aortic calcification.  Mild esophageal distention is identified. The previously evaluated mass of the distal esophagus is not specifically re- visualized without oral contrast today.  Periesophageal lymph node measures 0.8 cm in maximal short axis diameter image 53. Retrocrural distal esophageal node measures 1.0 cm image 67.  Heart size is mildly enlarged.  No pericardial effusion.  The examination is adequate for evaluation for acute pulmonary embolism up to and including the 3rd order pulmonary arteries. No focal filling defect is identified up to and including the 3rd order pulmonary arteries to suggest acute pulmonary embolism.  Lungs/Pleura: Minimal bibasilar dependent subpleural atelectasis. Bibasilar bronchiectasis is noted. Lingular volume loss. No measurable nodule, consolidation, or mass.  Central airways are patent.  Upper abdomen: Otherwise normal, please see comments above.  Musculoskeletal: No acute osseous abnormality or lytic or sclerotic osseous lesion.  Review of the MIP images confirms the above findings.  IMPRESSION: No acute cardiopulmonary process.  Mild bronchiectasis. Lingular volume loss and scarring or atelectasis.  Mild esophageal distention by fluid and periesophageal/retrocrural lymphadenopathy compatible with the provided history of esophageal carcinoma.   Electronically Signed   By: Conchita Paris M.D.   On: 09/10/2015 19:41   Dg Chest Port 1 View  09/10/2015   CLINICAL DATA:  Dizziness. Currently receiving treatment for  esophageal carcinoma  EXAM: PORTABLE CHEST 1 VIEW  COMPARISON:  07/20/2015  FINDINGS: Heart size upper normal. Postop CABG. AICD unchanged in position. Negative for heart failure  Mild left lower lobe atelectasis has progressed in the interval. Negative for pneumonia or effusion. No mass or lung nodule.  IMPRESSION: Mild left lower lobe atelectasis.   Electronically Signed   By: Franchot Gallo M.D.   On: 09/10/2015 14:00    ASSESSMENT AND PLAN  Principal Problem:  Acute respiratory failure with hypoxia Active Problems:  Hyperlipidemia  Coronary artery disease-status post CABG  Automatic implantable cardioverter-defibrillator in situ  Systolic and diastolic CHF, chronic  Near syncope  Gastroesophageal cancer  Atrial fibrillation  CKD (chronic kidney disease) stage 3, GFR 30-59 ml/min  Anemia of chronic disease  Leukopenia due to antineoplastic chemotherapy  Thrombocytopenia  Pancytopenia due to antineoplastic chemotherapy  Type 2 diabetes mellitus with renal manifestations  Hypothyroidism  Gout  Sepsis   Current facility-administered medications:  .  0.9 %  sodium chloride infusion, , Intravenous, Continuous, Robbie Lis, MD, Last Rate: 10 mL/hr at 09/12/15 2023, 10 mL at 09/12/15 2023 .  acetaminophen (TYLENOL) tablet 1,000 mg, 1,000 mg, Oral, Q6H PRN, Robbie Lis, MD, 1,000 mg at 09/14/15 1555 .  acetaminophen (TYLENOL) tablet 650 mg, 650 mg, Oral, Q6H PRN **OR** [DISCONTINUED] acetaminophen (TYLENOL) suppository 650 mg, 650 mg, Rectal, Q6H PRN, Robbie Lis, MD .  allopurinol (ZYLOPRIM) tablet 300 mg, 300 mg, Oral, Daily, Robbie Lis, MD, 300 mg at 09/14/15 0913 .  apixaban (ELIQUIS) tablet 5 mg, 5 mg, Oral, BID, Sueanne Margarita, MD, 5 mg at 09/14/15 0913 .  atorvastatin (LIPITOR) tablet 40 mg, 40 mg, Oral, q1800, Robbie Lis, MD, 40 mg at 09/13/15 1726 .  digoxin (LANOXIN) tablet 0.25 mg, 0.25 mg, Oral, Daily, Isaiah Serge, NP, 0.25 mg at 09/14/15  1550 .  famotidine (PEPCID) tablet 20 mg, 20 mg, Oral, Daily, Robbie Lis, MD, 20 mg at 09/14/15 0913 .  fentaNYL (SUBLIMAZE) injection 50 mcg, 50 mcg, Intravenous, Q2H PRN, Robbie Lis, MD, 50 mcg at 09/13/15 1158 .  glipiZIDE (GLUCOTROL) tablet 10 mg, 10 mg, Oral, BID AC, Robbie Lis, MD, 10 mg at 09/14/15 0726 .  HYDROcodone-acetaminophen (NORCO/VICODIN) 5-325 MG per tablet 1 tablet, 1 tablet, Oral, Q4H PRN, Robbie Lis, MD, 1 tablet at 09/13/15 1158 .  insulin aspart (novoLOG) injection 0-15 Units, 0-15 Units, Subcutaneous, TID WC, Robbie Lis, MD, 3 Units at 09/14/15 1203 .  insulin aspart (novoLOG) injection 0-5 Units, 0-5 Units, Subcutaneous, QHS, Robbie Lis, MD, 0 Units at 09/11/15 0059 .  ipratropium (ATROVENT) nebulizer solution 0.5 mg, 0.5 mg, Nebulization, Q4H PRN, Robbie Lis, MD .  levalbuterol Penne Lash) nebulizer solution 1.25 mg, 1.25 mg, Nebulization, Q4H PRN, Robbie Lis, MD .  levothyroxine (SYNTHROID, LEVOTHROID) tablet 75 mcg, 75 mcg, Oral, QAC breakfast, Robbie Lis, MD, 75 mcg at 09/14/15 0881 .  loperamide (IMODIUM) capsule 2 mg, 2 mg, Oral, Q6H PRN, Robbie Lis, MD, 2 mg at 09/14/15 1555 .  metoprolol tartrate (LOPRESSOR) tablet 50 mg, 50 mg, Oral, BID, Sueanne Margarita, MD, 50 mg at 09/14/15 0913 .  ondansetron (ZOFRAN) tablet 4 mg, 4 mg, Oral, Q6H PRN, 4 mg at 09/14/15 1228 **OR** ondansetron (ZOFRAN) injection 4 mg, 4 mg, Intravenous, Q6H PRN, Robbie Lis, MD, 4 mg at 09/13/15 1158 .  prochlorperazine (COMPAZINE) tablet 10 mg, 10 mg, Oral, Q6H PRN, Robbie Lis, MD, 10 mg at 09/13/15 0746 .  sodium chloride 0.9 % injection 3 mL, 3 mL, Intravenous, Q12H, Robbie Lis, MD, 3 mL at 09/14/15 0914 .  sucralfate (CARAFATE) tablet 1 g, 1 g, Oral, QID, Robbie Lis, MD, 1 g at 09/14/15 0913 .  zolpidem (AMBIEN) tablet 5 mg, 5 mg, Oral, QHS PRN, Dianne Dun, NP, 5 mg at 09/13/15 2117  1. Atrial Fibrillation w/ RVR: Eliquis 5 bid started  continue metoprolol 50 bid   2. Abnormal Troponin: Troponin with flat low level trend, 0.06, 0.05, 0.04. Likely secondary to rapid afib and sepsis. No indication for any ischemic w/u given recent cath.  3. CAD: s/p remote CABG x 3. Recent LHC 07/2015 showed stable anatomy with 3/3 patent grafts. Denies CP. Continue medical therapy.   4. Ischemic Cardiomyopathy: EF 30-35% by recent echo 07/2015. He has an ICD.   5. Chronic Combined Systolic + Diastolic CHF: volume appears stable.   6. ICD: followed by Dr. Caryl Comes  7. HTN: well controlled on current regimen   8. HLD: on Lipitor.   9. DM: per primary  10. Gastroesophageal Cancer: undergoing chem. Continue treatment per oncology.   11. CKD: renal function currently stable. SCr 0.92.   12. Thrombocytopenia: 69 today follow closely       Jenkins Rouge, MD  09/14/2015  5:05 PM

## 2015-09-14 NOTE — Evaluation (Signed)
Physical Therapy Evaluation Patient Details Name: Walter French MRN: [id number] DOB: 1946/09/14 Today's Date: 09/14/2015   History of Present Illness  69 year old male with thrombocytopenia, new onset afib; past medical history:DM chronic diastolic and systolic CHF, EF of 91-63%, s/p CABG, s/p AICD, adenocarcinoma the gastroesophageal junction, staging CT significant for metastatic adenopathy,radiation began 08/12/2015, chemotherapy 08/13/2015   Clinical Impression  Patient evaluated by Physical Therapy with no further acute PT needs identified. All education has been completed and the patient has no further questions.  See below for any follow-up Physical Therapy or equipment needs. PT is signing off. Thank you for this referral.  Pt doing well with mobility, HR 106 at rest -->122--> 130 during amb, back to 110s at rest; pt tol well, asymptomatic     Follow Up Recommendations No PT follow up    Equipment Recommendations  None recommended by PT    Recommendations for Other Services       Precautions / Restrictions        Mobility  Bed Mobility Overal bed mobility: Independent                Transfers Overall transfer level: Independent                  Ambulation/Gait Ambulation/Gait assistance: Supervision Ambulation Distance (Feet): 120 Feet Assistive device: None Gait Pattern/deviations: Step-through pattern;WFL(Within Functional Limits)        Stairs            Wheelchair Mobility    Modified Rankin (Stroke Patients Only)       Balance Overall balance assessment: No apparent balance deficits (not formally assessed)                                           Pertinent Vitals/Pain Pain Assessment: No/denies pain    Home Living Family/patient expects to be discharged to:: Private residence Living Arrangements: Spouse/significant other   Type of Home: Mobile home Home Access: Stairs to enter   Engineer, site of Steps: 5 or 3 Home Layout: One level Home Equipment: None      Prior Function Level of Independence: Independent               Hand Dominance        Extremity/Trunk Assessment   Upper Extremity Assessment: Overall WFL for tasks assessed           Lower Extremity Assessment: Overall WFL for tasks assessed         Communication   Communication: No difficulties  Cognition Arousal/Alertness: Awake/alert Behavior During Therapy: WFL for tasks assessed/performed Overall Cognitive Status: Within Functional Limits for tasks assessed                      General Comments      Exercises        Assessment/Plan    PT Assessment Patent does not need any further PT services  PT Diagnosis Difficulty walking   PT Problem List    PT Treatment Interventions     PT Goals (Current goals can be found in the Care Plan section) Acute Rehab PT Goals Patient Stated Goal: wants to go home PT Goal Formulation: All assessment and education complete, DC therapy    Frequency     Barriers to discharge        Co-evaluation  End of Session Equipment Utilized During Treatment: Gait belt Activity Tolerance: Patient tolerated treatment well;Treatment limited secondary to medical complications (Comment) (PT limited distance d/t HR) Patient left: with call bell/phone within reach;Other (comment) (EOB)           Time: 5110-2111 PT Time Calculation (min) (ACUTE ONLY): 24 min   Charges:   PT Evaluation $Initial PT Evaluation Tier I: 1 Procedure PT Treatments $Gait Training: 8-22 mins   PT G Codes:        Walter French 09/17/2015, 12:27 PM

## 2015-09-14 NOTE — Telephone Encounter (Signed)
Called [place] floor spoke with patient's nurse April, asked status of patient if he can have radiation today?,"Yes, can come via w/c, asked if patient can take his meds first before being treated", yes give him his meds, thanked April, called Miranda  And informed patient can come for treatment via w/c 9:14 AM

## 2015-09-14 NOTE — Progress Notes (Addendum)
Patient ID: Walter French, male   DOB: 01-09-1946, 69 y.o.   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 Patient was just seen in cancer center 09/09/2015 for blood work and oncology decided that his next chemo will be rescheduled for 09/17/2015 due to thrombocytopenia.   Patient presented to [facility] ED with repots of dizzy spells, seeing spots and almost passing out.   In ED, BP was 121/59, HR 125, RR 19, T max 98 F and oxygen saturation as low as 79% on room air. This has improved with [place] oxygen support to 100%. The 12 lead EKG showed atrial fibrillation. He was started on Cardizem drip. The blood work was notable for WBC count of 3.9, hemoglobin of 12.3, platelets of 100, creatinine of 1.29. CXR showed mild left lower lobe atelectasis. Lactic acid was 2.18. Patient was given empiric Rocephin in ED. Because he met sepsis criteria antibiotics were broadened to Zosyn and vancomycin. CT angiogram of the chest did not reveal pulmonary embolism, no acute cardiopulmonary process.  Transferred to telemetry 10/2. Started on apixaban 10/2.   Barrier to discharge: Home once HR controlled. Also, started on apixaban but platelets down this am from 81 to 69. We still need to monitor this.    Assessment/Plan:    Principal Problem:  Acute respiratory failure with hypoxia - Unclear etiology. No acute findings on CXR other than atelectasis. CT angio chest did not reveal acute  pulmonary embolism. - His respiratory status remains stable at this time. - He is only on as needed nebulzier treatments - Continue oxygen support via [place] to keep oxygen saturation above 90%.   Active Problems:  Sepsis, unspecified organism - Sepsis criteria met on the admission ( tachycardia, tachypnea, hypoxia, leukopenia and lactic acidosis). Procalcitonin 0.13. - No source of infection. He was on vanco and zosyn initially, changed to Levaquin 10/1. (Abx day # 5/7) - Blood cultures negative so far.    Atrial fibrillation, new onset - Secondary to hypoxia and sepsis.  - CHADS vasc score at least 5. - Required Cardizem drip. Now on PO beta blocker and cardio started digoxin - HR still 106-120 range - Will follow up on cardio recommendations - Started Eliquis 10/2 but platelets now down from 81 - 69. - Continue to monitor daily CBC      Troponin level elevated - Likely demand ischemia from hypoxia and sepsis. - Trop level stabilized - No chest pain   Hyperlipidemia - Continue Lipitor   Systolic and diastolic CHF, chronic / Automatic implantable cardioverter-defibrillator in situ / Coronary artery disease-status post CABG - 2 D ECHO in08/2016 with EF 30-35% with grade 2 diastolic dysfunction  - Has ICD   Near syncope - PT eval pending as of 10/3.    Gastroesophageal cancer - Last chemo 09/03/15 - Continue RT as planned (today 22/25 RT)   CKD (chronic kidney disease) stage 3, GFR 30-59 ml/min - Baseline creatinine 1.6 about 8 years ago - Creatinine WNL   Anemia of chronic disease - Anemia due to combination of chronic kidney disease, history  of malignancy and sequela of recent chemotherapy.  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  Family Communication:  plan of care discussed with the patient Disposition Plan: home once heart rate controlled.   IV access:  Peripheral IV  Procedures and diagnostic studies:    Ct Angio Chest Pe W/cm &/or Wo Cm 09/10/2015  No acute cardiopulmonary process.  Mild bronchiectasis. Lingular volume loss and scarring or atelectasis.  Mild esophageal distention by fluid and periesophageal/retrocrural lymphadenopathy compatible with the provided history of esophageal carcinoma.   Electronically Signed   By: Conchita Paris M.D.   On: 09/10/2015 19:41   Dg Chest Port 1 View 09/10/2015    Mild left lower lobe atelectasis.   Electronically Signed   By: Franchot Gallo M.D.   On: 09/10/2015 14:00   Medical Consultants:  Cardiology Oncology - Dr. Benay Spice   Other Consultants:  Physical therapy Nutrition  IAnti-Infectives:   Vancomycin 09/10/2015 --> 09/12/2015 Zosyn 09/10/2015 --> 09/12/2015 Levaquin 09/12/2015 -->    Leisa Lenz, MD  [facility] Pager [phone]  Time spent in minutes: 25 minutes  If 7PM-7AM, please contact night-coverage [email or web address] Password [email or web address] 09/14/2015, 10:54 AM   LOS: 4 days    HPI/Subjective: No acute overnight events. Patient eager to go home.   Objective: Filed Vitals:   09/13/15 1345 09/13/15 2102 09/14/15 0609 09/14/15 0913  BP: 118/76 132/55 173/88 145/78  Pulse: 119 107 117 114  Temp: 98.3 F (36.8 C) 98.5 F (36.9 C) 98 F (36.7 C)   TempSrc: Oral Oral Oral   Resp:  21 20   Height:      Weight:   105.5 kg (232 lb  9.4 oz)   SpO2: 99% 97% 98%     Intake/Output Summary (Last 24 hours) at 09/14/15 1054 Last data filed at 09/14/15 0819  Gross per 24 hour  Intake    363 ml  Output      0 ml  Net    363 ml    Exam:   General:  Pt is alert, no distress  Cardiovascular: tachycardic, irregular rhythm, (+) S1, S2   Respiratory: bilateral air entry, no wheezing   Abdomen: (+) BS, non tender   Extremities: no LE edema, palpable pulses    Neuro: non focal   Data Reviewed: Basic Metabolic Panel:  Recent Labs Lab 09/09/15 1025 09/10/15 1336 09/11/15 0345 09/13/15 0341 09/14/15 0540  NA 137 134* 139 136 136  K 4.4 3.5 3.6 3.9 3.7  CL  --  105 111 107 106  CO2 26 24 22 25 26   GLUCOSE 144* 142* 116* 135* 113*  BUN 11.2 16 14 11 10   CREATININE 1.1 1.29* 0.82 0.88 0.92  CALCIUM 9.6 8.9 8.5* 8.7* 8.5*   Liver Function Tests:  Recent Labs Lab 09/09/15 1025 09/10/15 1336 09/11/15 0345  AST 20 26 19   ALT <9 10* 8*  ALKPHOS 109 93 73  BILITOT 1.80* 1.9* 1.3*  PROT 6.7 6.8 5.7*  ALBUMIN 3.4* 3.5 2.9*   No results for input(s): LIPASE, AMYLASE in the last 168 hours. No results for input(s): AMMONIA in the last 168 hours. CBC:  Recent Labs Lab 09/09/15 1025 09/10/15 1336 09/11/15 0345 09/12/15 1003 09/13/15 0341 09/14/15 0540  WBC 3.0* 3.9* 2.3* 2.6* 2.9* 3.6*  NEUTROABS 2.3 2.9  --   --   --   --   HGB 12.6* 12.3* 10.1* 10.7* 10.5* 9.6*  HCT 36.0* 35.9* 29.5* 31.6* 30.9* 28.0*  MCV 82.0 82.5 83.3 83.4 84.2 82.8  PLT 68* 100* 63* 84* 81* 69*   Cardiac Enzymes:  Recent Labs Lab 09/10/15 2125 09/11/15 0115 09/11/15 0718  TROPONINI 0.06* 0.05* 0.04*   BNP: Invalid input(s): POCBNP CBG:  Recent Labs Lab 09/13/15 0824 09/13/15 1119 09/13/15 1715 09/13/15 2132 09/14/15 0709  GLUCAP 141* 134* 92 117* 136*    Recent Results (from the past 240 hour(s))  MRSA PCR Screening     Status: None   Collection Time: 09/10/15  8:37 PM  Result Value Ref Range Status    MRSA by PCR NEGATIVE NEGATIVE Final     . allopurinol  300 mg Oral Daily  . apixaban  5 mg Oral BID  . atorvastatin  40 mg Oral q1800  . digoxin  0.25 mg Oral Daily  . famotidine  20 mg Oral Daily  . glipiZIDE  10 mg Oral BID AC  . insulin aspart  0-15 Units Subcutaneous TID WC  . insulin aspart  0-5 Units Subcutaneous QHS  . levofloxacin (LEVAQUIN) IV  750 mg Intravenous Q24H  . levothyroxine  75 mcg Oral QAC breakfast  . metoprolol tartrate  50 mg Oral BID  . sodium chloride  3 mL Intravenous Q12H  . sucralfate  1 g Oral QID

## 2015-09-14 NOTE — Progress Notes (Signed)
[facility] Therapy Treatment Record Phone [phone]   Radiation Therapy was administered to [name] on: 09/14/2015  10:27 AM and was treatment  22 out of a planned course of 25 treatments.

## 2015-09-15 ENCOUNTER — Ambulatory Visit
Admission: RE | Admit: 2015-09-15 | Discharge: 2015-09-15 | Disposition: A | Discharge status: Home / Self Care | Payer: Medicare Other | Source: Ambulatory Visit | Attending: Radiation Oncology | Admitting: Radiation Oncology

## 2015-09-15 ENCOUNTER — Ambulatory Visit: Payer: Medicare Other | Admitting: Radiation Oncology

## 2015-09-15 LAB — CULTURE, BLOOD (ROUTINE X 2)
CULTURE: NO GROWTH
Culture: NO GROWTH

## 2015-09-15 LAB — CBC
HEMATOCRIT: 31.7 % — AB (ref 39.0–52.0)
HEMOGLOBIN: 11.3 g/dL — AB (ref 13.0–17.0)
MCH: 28.8 pg (ref 26.0–34.0)
MCHC: 35.6 g/dL (ref 30.0–36.0)
MCV: 80.9 fL (ref 78.0–100.0)
PLATELETS: 79 10*3/uL — AB (ref 150–400)
RBC: 3.92 MIL/uL — AB (ref 4.22–5.81)
RDW: 15.9 % — ABNORMAL HIGH (ref 11.5–15.5)
WBC: 4.1 10*3/uL (ref 4.0–10.5)

## 2015-09-15 LAB — GLUCOSE, CAPILLARY: Glucose-Capillary: 151 mg/dL — ABNORMAL HIGH (ref 65–99)

## 2015-09-15 MED ORDER — INSULIN ASPART 100 UNIT/ML FLEXPEN: PEN_INJECTOR | SUBCUTANEOUS | Status: DC

## 2015-09-15 MED ORDER — METOPROLOL TARTRATE 50 MG PO TABS
50.0000 mg | ORAL_TABLET | Freq: Three times a day (TID) | ORAL | Status: DC
  Administered 2015-09-15: 50 mg via ORAL
  Filled 2015-09-15: qty 1

## 2015-09-15 MED ORDER — DIGOXIN 250 MCG PO TABS: 0.2500 mg | ORAL_TABLET | Freq: Every day | ORAL | Status: DC

## 2015-09-15 MED ORDER — METOPROLOL TARTRATE 50 MG PO TABS: 50.0000 mg | ORAL_TABLET | Freq: Three times a day (TID) | ORAL | Status: DC

## 2015-09-15 MED ORDER — APIXABAN 5 MG PO TABS: 5.0000 mg | ORAL_TABLET | Freq: Two times a day (BID) | ORAL | Status: DC

## 2015-09-15 NOTE — Progress Notes (Signed)
Patient ID: Walter French, male   DOB: 11-06-1946, 69 y.o.   MRN: [id number]   SUBJECTIVE:  No complaints  OBJECTIVE:   Vitals:   Filed Vitals:   09/14/15 0913 09/14/15 1328 09/14/15 1550 09/15/15 0633  BP: 145/78 124/65  131/88  Pulse: 114 123 112 113  Temp:  98.2 F (36.8 C)  97.9 F (36.6 C)  TempSrc:  Oral  Oral  Resp:  20  20  Height:      Weight:    105.8 kg (233 lb 4 oz)  SpO2:  99%  95%   I&O's:    Intake/Output Summary (Last 24 hours) at 09/15/15 1884 Last data filed at 09/14/15 1660  Gross per 24 hour  Intake    120 ml  Output      0 ml  Net    120 ml   TELEMETRY: Reviewed telemetry pt in atrial fibrillation with RVR:     PHYSICAL EXAM Affect appropriate Overweight white male  HEENT: Strabismus with lazy right eye Neck supple with no adenopathy JVP normal no bruits no thyromegaly Lungs clear with no wheezing and good diaphragmatic motion Heart:  S1/S2 no murmur, no rub, gallop or click PMI normal Abdomen: benighn, BS positve, no tenderness, no AAA no bruit.  No HSM or HJR Distal pulses intact with no bruits No edema Neuro non-focal Erythema left forearm from old iv sight  No muscular weakness    LABS: Basic Metabolic Panel:  Recent Labs  09/13/15 0341 09/14/15 0540  NA 136 136  K 3.9 3.7  CL 107 106  CO2 25 26  GLUCOSE 135* 113*  BUN 11 10  CREATININE 0.88 0.92  CALCIUM 8.7* 8.5*   CBC:  Recent Labs  09/13/15 0341 09/14/15 0540  WBC 2.9* 3.6*  HGB 10.5* 9.6*  HCT 30.9* 28.0*  MCV 84.2 82.8  PLT 81* 69*    Anemia Panel: No results for input(s): VITAMINB12, FOLATE, FERRITIN, TIBC, IRON, RETICCTPCT in the last 72 hours. Coag Panel:   Lab Results  Component Value Date   INR 1.06 09/10/2015   INR 1.05 07/20/2015   INR 1.00 07/29/2013    RADIOLOGY: Ct Angio Chest Pe W/cm &/or Wo Cm  09/10/2015   CLINICAL DATA:  Presyncope, esophageal carcinoma  EXAM: CT ANGIOGRAPHY CHEST WITH CONTRAST  TECHNIQUE: Multidetector CT  imaging of the chest was performed using the standard protocol during bolus administration of intravenous contrast. Multiplanar CT image reconstructions and MIPs were obtained to evaluate the vascular anatomy.  CONTRAST:  122mL OMNIPAQUE IOHEXOL 350 MG/ML SOLN  COMPARISON:  Chest radiograph same date, PET-CT 08/07/2015  FINDINGS: Mediastinum/Nodes: Left-sided AICD in place. Evidence of CABG. Great vessels are normal in caliber. Moderate atheromatous coronary arterial and aortic calcification.  Mild esophageal distention is identified. The previously evaluated mass of the distal esophagus is not specifically re- visualized without oral contrast today.  Periesophageal lymph node measures 0.8 cm in maximal short axis diameter image 53. Retrocrural distal esophageal node measures 1.0 cm image 67.  Heart size is mildly enlarged.  No pericardial effusion.  The examination is adequate for evaluation for acute pulmonary embolism up to and including the 3rd order pulmonary arteries. No focal filling defect is identified up to and including the 3rd order pulmonary arteries to suggest acute pulmonary embolism.  Lungs/Pleura: Minimal bibasilar dependent subpleural atelectasis. Bibasilar bronchiectasis is noted. Lingular volume loss. No measurable nodule, consolidation, or mass.  Central airways are patent.  Upper abdomen: Otherwise normal, please see  comments above.  Musculoskeletal: No acute osseous abnormality or lytic or sclerotic osseous lesion.  Review of the MIP images confirms the above findings.  IMPRESSION: No acute cardiopulmonary process.  Mild bronchiectasis. Lingular volume loss and scarring or atelectasis.  Mild esophageal distention by fluid and periesophageal/retrocrural lymphadenopathy compatible with the provided history of esophageal carcinoma.   Electronically Signed   By: Conchita Paris M.D.   On: 09/10/2015 19:41   Dg Chest Port 1 View  09/10/2015   CLINICAL DATA:  Dizziness. Currently receiving  treatment for esophageal carcinoma  EXAM: PORTABLE CHEST 1 VIEW  COMPARISON:  07/20/2015  FINDINGS: Heart size upper normal. Postop CABG. AICD unchanged in position. Negative for heart failure  Mild left lower lobe atelectasis has progressed in the interval. Negative for pneumonia or effusion. No mass or lung nodule.  IMPRESSION: Mild left lower lobe atelectasis.   Electronically Signed   By: Franchot Gallo M.D.   On: 09/10/2015 14:00    ASSESSMENT AND PLAN  Principal Problem:  Acute respiratory failure with hypoxia Active Problems:  Hyperlipidemia  Coronary artery disease-status post CABG  Automatic implantable cardioverter-defibrillator in situ  Systolic and diastolic CHF, chronic  Near syncope  Gastroesophageal cancer  Atrial fibrillation  CKD (chronic kidney disease) stage 3, GFR 30-59 ml/min  Anemia of chronic disease  Leukopenia due to antineoplastic chemotherapy  Thrombocytopenia  Pancytopenia due to antineoplastic chemotherapy  Type 2 diabetes mellitus with renal manifestations  Hypothyroidism  Gout  Sepsis   Current facility-administered medications:  .  0.9 %  sodium chloride infusion, , Intravenous, Continuous, Robbie Lis, MD, Last Rate: 10 mL/hr at 09/12/15 2023, 10 mL at 09/12/15 2023 .  acetaminophen (TYLENOL) tablet 1,000 mg, 1,000 mg, Oral, Q6H PRN, Robbie Lis, MD, 1,000 mg at 09/14/15 1555 .  acetaminophen (TYLENOL) tablet 650 mg, 650 mg, Oral, Q6H PRN **OR** [DISCONTINUED] acetaminophen (TYLENOL) suppository 650 mg, 650 mg, Rectal, Q6H PRN, Robbie Lis, MD .  allopurinol (ZYLOPRIM) tablet 300 mg, 300 mg, Oral, Daily, Robbie Lis, MD, 300 mg at 09/14/15 0913 .  apixaban (ELIQUIS) tablet 5 mg, 5 mg, Oral, BID, Sueanne Margarita, MD, 5 mg at 09/14/15 2223 .  atorvastatin (LIPITOR) tablet 40 mg, 40 mg, Oral, q1800, Robbie Lis, MD, 40 mg at 09/14/15 1742 .  digoxin (LANOXIN) tablet 0.25 mg, 0.25 mg, Oral, Daily, Isaiah Serge, NP, 0.25 mg at  09/14/15 1550 .  famotidine (PEPCID) tablet 20 mg, 20 mg, Oral, Daily, Robbie Lis, MD, 20 mg at 09/14/15 0913 .  fentaNYL (SUBLIMAZE) injection 50 mcg, 50 mcg, Intravenous, Q2H PRN, Robbie Lis, MD, 50 mcg at 09/13/15 1158 .  glipiZIDE (GLUCOTROL) tablet 10 mg, 10 mg, Oral, BID AC, Robbie Lis, MD, 10 mg at 09/15/15 0800 .  HYDROcodone-acetaminophen (NORCO/VICODIN) 5-325 MG per tablet 1 tablet, 1 tablet, Oral, Q4H PRN, Robbie Lis, MD, 1 tablet at 09/13/15 1158 .  insulin aspart (novoLOG) injection 0-15 Units, 0-15 Units, Subcutaneous, TID WC, Robbie Lis, MD, 3 Units at 09/15/15 0800 .  insulin aspart (novoLOG) injection 0-5 Units, 0-5 Units, Subcutaneous, QHS, Robbie Lis, MD, 0 Units at 09/11/15 0059 .  ipratropium (ATROVENT) nebulizer solution 0.5 mg, 0.5 mg, Nebulization, Q4H PRN, Robbie Lis, MD .  levalbuterol [facility]) nebulizer solution 1.25 mg, 1.25 mg, Nebulization, Q4H PRN, Robbie Lis, MD .  levothyroxine (SYNTHROID, LEVOTHROID) tablet 75 mcg, 75 mcg, Oral, QAC breakfast, Robbie Lis, MD, 75 mcg at  09/15/15 0800 .  loperamide (IMODIUM) capsule 2 mg, 2 mg, Oral, Q6H PRN, Robbie Lis, MD, 2 mg at 09/14/15 2223 .  metoprolol tartrate (LOPRESSOR) tablet 50 mg, 50 mg, Oral, BID, Sueanne Margarita, MD, 50 mg at 09/14/15 2223 .  ondansetron (ZOFRAN) tablet 4 mg, 4 mg, Oral, Q6H PRN, 4 mg at 09/14/15 1228 **OR** ondansetron (ZOFRAN) injection 4 mg, 4 mg, Intravenous, Q6H PRN, Robbie Lis, MD, 4 mg at 09/13/15 1158 .  prochlorperazine (COMPAZINE) tablet 10 mg, 10 mg, Oral, Q6H PRN, Robbie Lis, MD, 10 mg at 09/14/15 1742 .  sodium chloride 0.9 % injection 3 mL, 3 mL, Intravenous, Q12H, Robbie Lis, MD, 3 mL at 09/14/15 2223 .  sucralfate (CARAFATE) tablet 1 g, 1 g, Oral, QID, Robbie Lis, MD, 1 g at 09/14/15 2223 .  zolpidem (AMBIEN) tablet 5 mg, 5 mg, Oral, QHS PRN, Dianne Dun, NP, 5 mg at 09/14/15 2223  1. Atrial Fibrillation w/ RVR: Eliquis 5 bid  started increase metoprolol to 50 tid   2. Abnormal Troponin: Troponin with flat low level trend, 0.06, 0.05, 0.04. Likely secondary to rapid afib and sepsis. No indication for any ischemic w/u given recent cath.  3. CAD: s/p remote CABG x 3. Recent LHC 07/2015 showed stable anatomy with 3/3 patent grafts. Denies CP. Continue medical therapy.   4. Ischemic Cardiomyopathy: EF 30-35% by recent echo 07/2015. He has an ICD.   5. Chronic Combined Systolic + Diastolic CHF: volume appears stable.   6. ICD: followed by Dr. Caryl Comes  7. HTN: well controlled on current regimen   8. HLD: on Lipitor.   9. DM: per primary  10. Gastroesophageal Cancer: undergoing chem. Continue treatment per oncology.   11. CKD: renal function currently stable. SCr 0.92.   12. Thrombocytopenia: 69 today follow closely    Will arrange outpatient f/u with Dr Martinique Will sign off    Jenkins Rouge, MD  09/15/2015  8:12 AM

## 2015-09-15 NOTE — Progress Notes (Signed)
D/c'd to home via w/c voices no c/o

## 2015-09-15 NOTE — Care Management Note (Signed)
Case Management Note  Patient Details  Name: Walter French MRN: [id number] Date of Birth: Dec 10, 1946  Subjective/Objective:   [facility] chosen for [facility]. TC Kristen rep aware of Terry orders, & d/c.                 Action/Plan:d/c home w/[facility].   Expected Discharge Date:   (UNKNOWN)               Expected Discharge Plan:  [place]  In-House Referral:  NA  Discharge planning Services  CM Consult  Post Acute Care Choice:  NA Choice offered to:  Patient  DME Arranged:    DME Agency:     HH Arranged:  RN, PT, OT, Nurse's Aide [place] Agency:     Status of Service:  Completed, signed off  Medicare Important Message Given:    Date Medicare IM Given:    Medicare IM give by:    Date Additional Medicare IM Given:    Additional Medicare Important Message give by:     If discussed at [place] of Stay Meetings, dates discussed:    Additional Comments:  Dessa Phi, RN 09/15/2015, 11:10 AM

## 2015-09-15 NOTE — Discharge Summary (Addendum)
Physician Discharge Summary  Walter French WUJ:[id number] DOB: 30-Apr-1946 DOA: 09/10/2015  PCP: Karlene Einstein, MD  Admit date: 09/10/2015 Discharge date: 09/15/2015  Recommendations for Outpatient Follow-up:  Please note new medication: apixaban (blood thinner for atrial fibrillation). You have to monitor fo r any signs of bleed (blood in stool or black tarry stool, vomiting blood or bleeding from mucosal surfaces).  New medication also digoxin to control heart rate.   Metoprolol now 50 mg taken 3 times daily also to control heart rate.  We did not resume your insulin as before because your sugars were on low side so you may use sliding scale insulin until your intake improves. The following is the sliding scale and you can use the dose depending on what range your sugar measurement falls.  CBG 121 - 150: 2 units    CBG 151 - 200: 3 units    CBG 201 - 250: 5 units    CBG 251 - 300: 8 units    CBG 301 - 350: 11 units    CBG 351 - 400: 15 units   Discharge Diagnoses:  Principal Problem:   Acute respiratory failure with hypoxia (HCC) Active Problems:   Atrial fibrillation (HCC)   Sepsis ([place])   Hyperlipidemia   Coronary artery disease-status post CABG   Automatic implantable cardioverter-defibrillator in situ   Systolic and diastolic CHF, chronic (HCC)   Near syncope   Gastroesophageal cancer (HCC)   CKD (chronic kidney disease) stage 3, GFR 30-59 ml/min   Anemia of chronic disease   Leukopenia due to antineoplastic chemotherapy   Thrombocytopenia (HCC)   Pancytopenia due to antineoplastic chemotherapy (HCC)   Type 2 diabetes mellitus with renal manifestations (HCC)   Hypothyroidism   Gout   Troponin level elevated   Atrial fibrillation with RVR ([name])    Discharge Condition: stable   Diet recommendation: as tolerated   History of present illness:  69 year old male with past medical history of chronic diastolic and systolic CHF (2 D ECHO in 07/2015 showed  EF of 30-35% with grade 2 diastolic dysfunction), s/p CABG, s/p AICD, 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 Patient was just seen in cancer center 09/09/2015 for blood work and oncology decided that his next chemo will be rescheduled for 09/17/2015 due to thrombocytopenia.   Patient presented to [facility] ED with repots of dizzy spells, seeing spots and almost passing out.   In ED, BP was 121/59, HR 125, RR 19, T max 98 F and oxygen saturation as low as 79% on room air. This has improved with [place] oxygen support to 100%. The 12 lead EKG showed atrial fibrillation. He was started on Cardizem drip. The blood work was notable for WBC count of 3.9, hemoglobin of 12.3, platelets of 100, creatinine of 1.29. CXR showed mild left lower lobe atelectasis. Lactic acid was 2.18. Patient was given empiric Rocephin in ED. Because he met sepsis criteria antibiotics were broadened to Zosyn and vancomycin. CT angiogram of the chest did not reveal pulmonary embolism, no acute cardiopulmonary process.  Transferred to telemetry 10/2. Started on apixaban 10/2.   Hospital Course:   Assessment/Plan:    Principal Problem:  Acute respiratory failure with hypoxia - CXR on admission showed no acute findings other than atelectasis. CT angio chest did not reveal acute pulmonary embolism. - He is stable respiratory system wise  Active Problems:  Sepsis, unspecified organism - Sepsis criteria met on the admission ( tachycardia, tachypnea, hypoxia, leukopenia and lactic acidosis). Procalcitonin 0.13. - He was on vanco and zosyn through 10/1. Then changed to Levaquin. Levaquin stopped 10/3. Blood cultures negative so abx stopped. No source if infection identified.    Atrial fibrillation, new onset - Secondary to  combination of hypoxia and sepsis  - CHADS vasc score at least 5. - Required Cardizem drip. Now per cardio he is on metoprolol and digoxin and HR in 110's which acceptable for cardiology. - HE will continue apixaban on D/C. Platelets stable, 79.    Troponin level elevated - Likely secondary to demand ischemia from hypoxia and sepsis. - Trop level stabilized - No further work up indicated per cardio    Hyperlipidemia - Continue Lipitor on discharge    Systolic and diastolic CHF, chronic / Automatic implantable cardioverter-defibrillator in situ / Coronary artery disease-status post CABG - 2 D ECHO in08/2016 with EF 30-35% with grade 2 diastolic dysfunction  - S/P ICD   Near syncope - HH orders in place.    Gastroesophageal cancer - Last chemo 09/03/15 - Continue RT as planned, today will be #23 out of 25 RT planned    CKD (chronic kidney disease) stage 3, GFR 30-59 ml/min - Baseline creatinine 1.6 about 8 years ago - Creatinine now WNL   Anemia of chronic disease - Anemia due to combination of chronic kidney disease, history of malignancy and sequela of recent chemotherapy.  - Hemoglobin stable    Leukopenia due to antineoplastic chemotherapy / Thrombocytopenia / Pancytopenia due to antineoplastic chemotherapy - Pancytopenia secondary to recent chemotherapy (last infusion received 09/03/2015)  - WBC count WNL - Hemoglobin 11.3 today - Platelets 79 today - All counts improved prior to discharge.    Type 2 diabetes mellitus with renal manifestations with current long term insulin use  - Continue glipizide and SSI on discharge    Hypothyroidism - Continue synthroid - TSH WNL   Gout - Continue allopurinol on discharge    DVT prophylaxis:  - Continue Eliquis on discharge    Code Status: Full.  Family Communication: plan of care discussed with the patient   IV access:  Peripheral IV  Procedures and diagnostic studies:   Ct Angio Chest  Pe W/cm &/or Wo Cm 09/10/2015 No acute cardiopulmonary process. Mild bronchiectasis. Lingular volume loss and scarring or atelectasis. Mild esophageal distention by fluid and periesophageal/retrocrural lymphadenopathy compatible with the provided history of esophageal carcinoma. Electronically Signed By: Conchita Paris M.D. On: 09/10/2015 19:41   Dg Chest Port 1 View 09/10/2015 Mild left lower lobe atelectasis. Electronically Signed By: Franchot Gallo M.D. On: 09/10/2015 14:00   Medical Consultants:  Cardiology Oncology - Dr. Benay Spice   Other Consultants:  Physical therapy Nutrition  IAnti-Infectives:   Vancomycin 09/10/2015 --> 09/12/2015 Zosyn 09/10/2015 --> 09/12/2015 Levaquin 09/12/2015 --> 09/14/2015   Signed:  Leisa Lenz, MD  [facility] 09/15/2015, 9:00 AM  Pager #: [phone]  Time spent in minutes: more than 30 minutes    Discharge Exam: Filed Vitals:   09/15/15 0853  BP: 117/72  Pulse:   Temp:   Resp:    Filed Vitals:   09/14/15 1328 09/14/15 1550 09/15/15 0633 09/15/15 0853  BP: 124/65  131/88 117/72  Pulse: 123 112 113   Temp: 98.2 F (36.8 C)  97.9 F (36.6 C)   TempSrc: Oral  Oral   Resp: 20  20   Height:      Weight:  105.8 kg (233 lb 4 oz)   SpO2: 99%  95%     General: Pt is alert, follows commands appropriately, not in acute distress Cardiovascular: irregular rhythm, tachycardic, (+) S1, S2  Respiratory: Clear to auscultation bilaterally, no wheezing, no crackles, no rhonchi Abdominal: Soft, non tender, non distended, bowel sounds +, no guarding Extremities: no edema, no cyanosis, pulses palpable bilaterally DP and PT Neuro: Grossly nonfocal  Discharge Instructions  Discharge Instructions    Call MD for:  difficulty breathing, headache or visual disturbances    Complete by:  As directed      Call MD for:  persistant dizziness or light-headedness    Complete by:  As directed      Call MD for:   persistant nausea and vomiting    Complete by:  As directed      Call MD for:  severe uncontrolled pain    Complete by:  As directed      Diet - low sodium heart healthy    Complete by:  As directed      Discharge instructions    Complete by:  As directed   Please note new medication: apixaban (blood thinner for atrial fibrillation). You have to monitor fo r any signs of bleed (blood in stool or black tarry stool, vomiting blood or bleeding from mucosal surfaces). New medication also digoxin to control heart rate.  Metoprolol now 50 mg taken 3 times daily also to control heart rate. We did not resume your insulin as before because your sugars were on low side so you may use sliding scale insulin until your intake improves. The following is the sliding scale and you can use the dose depending on what range your sugar measurement falls.  CBG 121 - 150: 2 units    CBG 151 - 200: 3 units    CBG 201 - 250: 5 units    CBG 251 - 300: 8 units    CBG 301 - 350: 11 units    CBG 351 - 400: 15 units     Increase activity slowly    Complete by:  As directed             Medication List    STOP taking these medications        aspirin 81 MG tablet     insulin detemir 100 UNIT/ML injection  Commonly known as:  LEVEMIR     metoprolol succinate 100 MG 24 hr tablet  Commonly known as:  TOPROL-XL     metroNIDAZOLE 0.75 % gel  Commonly known as:  METROGEL     valsartan 160 MG tablet  Commonly known as:  DIOVAN      TAKE these medications        acetaminophen 500 MG tablet  Commonly known as:  TYLENOL  Take 1,000 mg by mouth every 6 (six) hours as needed for moderate pain or headache.     allopurinol 300 MG tablet  Commonly known as:  ZYLOPRIM  Take 300 mg by mouth daily.     apixaban 5 MG Tabs tablet  Commonly known as:  ELIQUIS  Take 1 tablet (5 mg total) by mouth 2 (two) times daily.     atorvastatin 40 MG tablet  Commonly known as:  LIPITOR  Take 1 tablet (40 mg  total) by mouth daily.     dexamethasone 4 MG tablet  Commonly known as:  DECADRON  Take 10 mg at 10pm..... night before chemotherapy  digoxin 0.25 MG tablet  Commonly known as:  LANOXIN  Take 1 tablet (0.25 mg total) by mouth daily.     glipiZIDE 10 MG tablet  Commonly known as:  GLUCOTROL  Take 10 mg by mouth 2 (two) times daily before a meal.     hyaluronate sodium Gel  Apply 1 application topically as needed.     HYDROcodone-acetaminophen 5-325 MG tablet  Commonly known as:  NORCO/VICODIN  TAKE 1 TABLET BY MOUTH 4 TIMES DAILY AS NEEDED FOR PAIN     insulin aspart 100 UNIT/ML FlexPen  Commonly known as:  NOVOLOG FLEXPEN  Use the following scale to administer insulin depending on CBG              CBG 121 - 150: 2  units CBG 151 - 200:3 units CBG 201 - 250:5 units CBG 251 - 300:8 units CBG 301 - 350:11 units CBG 351 - 400:15 units     levothyroxine 75 MCG tablet  Commonly known as:  SYNTHROID, LEVOTHROID  Take 75 mcg by mouth daily before breakfast.     metoprolol 50 MG tablet  Commonly known as:  LOPRESSOR  Take 1 tablet (50 mg total) by mouth 3 (three) times daily.     nitroGLYCERIN 0.4 MG SL tablet  Commonly known as:  NITROSTAT  Place 1 tablet (0.4 mg total) under the tongue every 5 (five) minutes x 3 doses as needed for chest pain.     PRESCRIPTION MEDICATION  Chemo at [facility]     prochlorperazine 10 MG tablet  Commonly known as:  COMPAZINE  Take 1 tablet (10 mg total) by mouth every 6 (six) hours as needed for nausea or vomiting.     ranitidine 300 MG tablet  Commonly known as:  ZANTAC  Take 1 tablet (300 mg total) by mouth daily as needed for heartburn.     sucralfate 1 G tablet  Commonly known as:  CARAFATE  Take 1 tablet (1 g total) by mouth 4 (four) times daily.     traMADol 50 MG tablet  Commonly known as:  ULTRAM  Take 1 tablet (50 mg total) by mouth every 6 (six) hours as needed for moderate pain.     VICTOZA 18 MG/3ML Sopn  Generic  drug:  Liraglutide  Inject 1.8 mg into the skin daily.           Follow-up Information    Follow up with Peter Martinique, MD.   Specialty:  Cardiology   Why:  office will contact you   Contact information:   [address] [place] [address] [phone]       Follow up with Karlene Einstein, MD. Schedule an appointment as soon as possible for a visit in 1 week.   Specialty:  Family Medicine   Why:  Follow up appt after recent hospitalization   Contact information:   [address] [place] [address] [phone]       Follow up with Betsy Coder, MD On 09/15/2015.   Specialty:  Oncology   Why:  Follow up appt after recent hospitalization   Contact information:   [place] [address] [phone]        The results of significant diagnostics from this hospitalization (including imaging, microbiology, ancillary and laboratory) are listed below for reference.    Significant Diagnostic Studies: Ct Angio Chest Pe W/cm &/or Wo Cm  09/10/2015   CLINICAL DATA:  Presyncope, esophageal carcinoma  EXAM: CT  ANGIOGRAPHY CHEST WITH CONTRAST  TECHNIQUE: Multidetector CT imaging of the chest was performed using the standard protocol during bolus administration of intravenous contrast. Multiplanar CT image reconstructions and MIPs were obtained to evaluate the vascular anatomy.  CONTRAST:  120m OMNIPAQUE IOHEXOL 350 MG/ML SOLN  COMPARISON:  Chest radiograph same date, PET-CT 08/07/2015  FINDINGS: Mediastinum/Nodes: Left-sided AICD in place. Evidence of CABG. Great vessels are normal in caliber. Moderate atheromatous coronary arterial and aortic calcification.  Mild esophageal distention is identified. The previously evaluated mass of the distal esophagus is not specifically re- visualized without oral contrast today.  Periesophageal lymph node measures 0.8 cm in maximal short axis diameter image 53. Retrocrural distal esophageal node measures 1.0 cm  image 67.  Heart size is mildly enlarged.  No pericardial effusion.  The examination is adequate for evaluation for acute pulmonary embolism up to and including the 3rd order pulmonary arteries. No focal filling defect is identified up to and including the 3rd order pulmonary arteries to suggest acute pulmonary embolism.  Lungs/Pleura: Minimal bibasilar dependent subpleural atelectasis. Bibasilar bronchiectasis is noted. Lingular volume loss. No measurable nodule, consolidation, or mass.  Central airways are patent.  Upper abdomen: Otherwise normal, please see comments above.  Musculoskeletal: No acute osseous abnormality or lytic or sclerotic osseous lesion.  Review of the MIP images confirms the above findings.  IMPRESSION: No acute cardiopulmonary process.  Mild bronchiectasis. Lingular volume loss and scarring or atelectasis.  Mild esophageal distention by fluid and periesophageal/retrocrural lymphadenopathy compatible with the provided history of esophageal carcinoma.   Electronically Signed   By: GConchita ParisM.D.   On: 09/10/2015 19:41   Dg Chest Port 1 View  09/10/2015   CLINICAL DATA:  Dizziness. Currently receiving treatment for esophageal carcinoma  EXAM: PORTABLE CHEST 1 VIEW  COMPARISON:  07/20/2015  FINDINGS: Heart size upper normal. Postop CABG. AICD unchanged in position. Negative for heart failure  Mild left lower lobe atelectasis has progressed in the interval. Negative for pneumonia or effusion. No mass or lung nodule.  IMPRESSION: Mild left lower lobe atelectasis.   Electronically Signed   By: CFranchot GalloM.D.   On: 09/10/2015 14:00    Microbiology: Recent Results (from the past 240 hour(s))  Blood Culture (routine x 2)     Status: None (Preliminary result)   Collection Time: 09/10/15  1:37 PM  Result Value Ref Range Status   Specimen Description BLOOD LEFT ANTECUBITAL  Final   Special Requests BOTTLES DRAWN AEROBIC AND ANAEROBIC 5ML  Final   Culture   Final    NO GROWTH 4  DAYS [facility]   Report Status PENDING  Incomplete  Blood Culture (routine x 2)     Status: None (Preliminary result)   Collection Time: 09/10/15  1:37 PM  Result Value Ref Range Status   Specimen Description BLOOD LEFT ARM  Final   Special Requests BOTTLES DRAWN AEROBIC AND ANAEROBIC 8CC  Final   Culture   Final    NO GROWTH 4 DAYS [facility]   Report Status PENDING  Incomplete  MRSA PCR Screening     Status: None   Collection Time: 09/10/15  8:37 PM  Result Value Ref Range Status   MRSA by PCR NEGATIVE NEGATIVE Final  Culture, Urine     Status: None   Collection Time: 09/11/15  1:10 AM  Result Value Ref Range Status   Specimen Description URINE, RANDOM  Final   Special Requests  NONE  Final   Culture   Final    NO GROWTH 1 DAY Performed at [facility]    Report Status 09/12/2015 FINAL  Final     Labs: Basic Metabolic Panel:  Recent Labs Lab 09/09/15 1025 09/10/15 1336 09/11/15 0345 09/13/15 0341 09/14/15 0540  NA 137 134* 139 136 136  K 4.4 3.5 3.6 3.9 3.7  CL  --  105 111 107 106  CO2 26 24 22 25 26   GLUCOSE 144* 142* 116* 135* 113*  BUN 11.2 16 14 11 10   CREATININE 1.1 1.29* 0.82 0.88 0.92  CALCIUM 9.6 8.9 8.5* 8.7* 8.5*   Liver Function Tests:  Recent Labs Lab 09/09/15 1025 09/10/15 1336 09/11/15 0345  AST 20 26 19   ALT <9 10* 8*  ALKPHOS 109 93 73  BILITOT 1.80* 1.9* 1.3*  PROT 6.7 6.8 5.7*  ALBUMIN 3.4* 3.5 2.9*   No results for input(s): LIPASE, AMYLASE in the last 168 hours. No results for input(s): AMMONIA in the last 168 hours. CBC:  Recent Labs Lab 09/09/15 1025  09/10/15 1336 09/11/15 0345 09/12/15 1003 09/13/15 0341 09/14/15 0540 09/15/15 0840  WBC 3.0*  < > 3.9* 2.3* 2.6* 2.9* 3.6* 4.1  NEUTROABS 2.3  --  2.9  --   --   --   --   --   HGB 12.6*  < > 12.3* 10.1* 10.7* 10.5* 9.6* 11.3*  HCT 36.0*  < > 35.9* 29.5* 31.6* 30.9* 28.0* 31.7*  MCV 82.0  < > 82.5 83.3 83.4 84.2  82.8 80.9  PLT 68*  < > 100* 63* 84* 81* 69* 79*  < > = values in this interval not displayed. Cardiac Enzymes:  Recent Labs Lab 09/10/15 2125 09/11/15 0115 09/11/15 0718  TROPONINI 0.06* 0.05* 0.04*   BNP: BNP (last 3 results)  Recent Labs  07/20/15 0512 09/10/15 1844  BNP 217.8* 254.6*    ProBNP (last 3 results) No results for input(s): PROBNP in the last 8760 hours.  CBG:  Recent Labs Lab 09/14/15 0709 09/14/15 1153 09/14/15 1644 09/14/15 2116 09/15/15 0753  GLUCAP 136* 156* 80 110* 151*

## 2015-09-15 NOTE — Care Management Important Message (Signed)
Important Message  Patient Details  Name: Walter French MRN: [id number] Date of Birth: October 03, 1946   Medicare Important Message Given:  [facility] notification given    Camillo Flaming 09/15/2015, 12:03 [place] Message  Patient Details  Name: Walter French MRN: [id number] Date of Birth: 23-Dec-1945   Medicare Important Message Given:  Yes-second notification given    Camillo Flaming 09/15/2015, 12:02 PM

## 2015-09-15 NOTE — Discharge Instructions (Signed)
Atrial Fibrillation °Atrial fibrillation is a type of irregular heart rhythm (arrhythmia). During atrial fibrillation, the upper chambers of the heart (atria) quiver continuously in a chaotic pattern. This causes an irregular and often rapid heart rate.  °Atrial fibrillation is the result of the heart becoming overloaded with disorganized signals that tell it to beat. These signals are normally released one at a time by a part of the right atrium called the sinoatrial node. They then travel from the atria to the lower chambers of the heart (ventricles), causing the atria and ventricles to contract and pump blood as they pass. In atrial fibrillation, parts of the atria outside of the sinoatrial node also release these signals. This results in two problems. First, the atria receive so many signals that they do not have time to fully contract. Second, the ventricles, which can only receive one signal at a time, beat irregularly and out of rhythm with the atria.  °There are three types of atrial fibrillation:  °· Paroxysmal. Paroxysmal atrial fibrillation starts suddenly and stops on its own within a week. °· Persistent. Persistent atrial fibrillation lasts for more than a week. It may stop on its own or with treatment. °· Permanent. Permanent atrial fibrillation does not go away. Episodes of atrial fibrillation may lead to permanent atrial fibrillation. °Atrial fibrillation can prevent your heart from pumping blood normally. It increases your risk of stroke and can lead to heart failure.  °CAUSES  °· Heart conditions, including a heart attack, heart failure, coronary artery disease, and heart valve conditions.   °· Inflammation of the sac that surrounds the heart (pericarditis). °· Blockage of an artery in the lungs (pulmonary embolism). °· Pneumonia or other infections. °· Chronic lung disease. °· Thyroid problems, especially if the thyroid is overactive (hyperthyroidism). °· Caffeine, excessive alcohol use, and use  of some illegal drugs.   °· Use of some medicines, including certain decongestants and diet pills. °· Heart surgery.   °· Birth defects.   °Sometimes, no cause can be found. When this happens, the atrial fibrillation is called lone atrial fibrillation. The risk of complications from atrial fibrillation increases if you have lone atrial fibrillation and you are age 60 years or older. °RISK FACTORS °· Heart failure. °· Coronary artery disease. °· Diabetes mellitus.   °· High blood pressure (hypertension).   °· Obesity.   °· Other arrhythmias.   °· Increased age. °SIGNS AND SYMPTOMS  °· A feeling that your heart is beating rapidly or irregularly.   °· A feeling of discomfort or pain in your chest.   °· Shortness of breath.   °· Sudden light-headedness or weakness.   °· Getting tired easily when exercising.   °· Urinating more often than normal (mainly when atrial fibrillation first begins).   °In paroxysmal atrial fibrillation, symptoms may start and suddenly stop. °DIAGNOSIS  °Your health care provider may be able to detect atrial fibrillation when taking your pulse. Your health care provider may have you take a test called an ambulatory electrocardiogram (ECG). An ECG records your heartbeat patterns over a 24-hour period. You may also have other tests, such as: °· Transthoracic echocardiogram (TTE). During echocardiography, sound waves are used to evaluate how blood flows through your heart. °· Transesophageal echocardiogram (TEE). °· Stress test. There is more than one type of stress test. If a stress test is needed, ask your health care provider about which type is best for you. °· Chest X-ray exam. °· Blood tests. °· Computed tomography (CT). °TREATMENT  °Treatment may include: °· Treating any underlying conditions. For example, if you   have an overactive thyroid, treating the condition may correct atrial fibrillation.  Taking medicine. Medicines may be given to control a rapid heart rate or to prevent blood  clots, heart failure, or a stroke.  Having a procedure to correct the rhythm of the heart:  Electrical cardioversion. During electrical cardioversion, a controlled, low-energy shock is delivered to the heart through your skin. If you have chest pain, very low blood pressure, or sudden heart failure, this procedure may need to be done as an emergency.  Catheter ablation. During this procedure, heart tissues that send the signals that cause atrial fibrillation are destroyed.  Surgical ablation. During this surgery, thin lines of heart tissue that carry the abnormal signals are destroyed. This procedure can either be an open-heart surgery or a minimally invasive surgery. With the minimally invasive surgery, small cuts are made to access the heart instead of a large opening.  Pulmonary venous isolation. During this surgery, tissue around the veins that carry blood from the lungs (pulmonary veins) is destroyed. This tissue is thought to carry the abnormal signals. HOME CARE INSTRUCTIONS   Take medicines only as directed by your health care provider. Some medicines can make atrial fibrillation worse or recur.  If blood thinners were prescribed by your health care provider, take them exactly as directed. Too much blood-thinning medicine can cause bleeding. If you take too little, you will not have the needed protection against stroke and other problems.  Perform blood tests at home if directed by your health care provider. Perform blood tests exactly as directed.  Quit smoking if you smoke.  Do not drink alcohol.  Do not drink caffeinated beverages such as coffee, soda, and some teas. You may drink decaffeinated coffee, soda, or tea.   Maintain a healthy weight.Do not use diet pills unless your health care provider approves. They may make heart problems worse.   Follow diet instructions as directed by your health care provider.  Exercise regularly as directed by your health care  provider.  Keep all follow-up visits as directed by your health care provider. This is important. PREVENTION  The following substances can cause atrial fibrillation to recur:   Caffeinated beverages.  Alcohol.  Certain medicines, especially those used for breathing problems.  Certain herbs and herbal medicines, such as those containing ephedra or ginseng.  Illegal drugs, such as cocaine and amphetamines. Sometimes medicines are given to prevent atrial fibrillation from recurring. Proper treatment of any underlying condition is also important in helping prevent recurrence.  SEEK MEDICAL CARE IF:  You notice a change in the rate, rhythm, or strength of your heartbeat.  You suddenly begin urinating more frequently.  You tire more easily when exerting yourself or exercising. SEEK IMMEDIATE MEDICAL CARE IF:   You have chest pain, abdominal pain, sweating, or weakness.  You feel nauseous.  You have shortness of breath.  You suddenly have swollen feet and ankles.  You feel dizzy.  Your face or limbs feel numb or weak.  You have a change in your vision or speech. MAKE SURE YOU:   Understand these instructions.  Will watch your condition.  Will get help right away if you are not doing well or get worse. Document Released: 11/28/2005 Document Revised: 04/14/2014 Document Reviewed: 01/08/2013 [facility] Patient Information 2015 [place], [place]. This information is not intended to replace advice given to you by your health care provider. Make sure you discuss any questions you have with your health care provider. Apixaban oral tablets What is this  medicine? APIXABAN (a PIX a ban) is an anticoagulant (blood thinner). It is used to lower the chance of stroke in people with a medical condition called atrial fibrillation. It is also used to treat or prevent blood clots in the lungs or in the veins. This medicine may be used for other purposes; ask your health care provider or  pharmacist if you have questions. COMMON BRAND NAME(S): Eliquis What should I tell my health care provider before I take this medicine? They need to know if you have any of these conditions: -bleeding disorders -bleeding in the brain -blood in your stools (black or tarry stools) or if you have blood in your vomit -history of stomach bleeding -kidney disease -liver disease -mechanical heart valve -an unusual or allergic reaction to apixaban, other medicines, foods, dyes, or preservatives -pregnant or trying to get pregnant -breast-feeding How should I use this medicine? Take this medicine by mouth with a glass of water. Follow the directions on the prescription label. You can take it with or without food. If it upsets your stomach, take it with food. Take your medicine at regular intervals. Do not take it more often than directed. Do not stop taking except on your doctor's advice. Stopping this medicine may increase your risk of a blot clot. Be sure to refill your prescription before you run out of medicine. Talk to your pediatrician regarding the use of this medicine in children. Special care may be needed. Overdosage: If you think you have taken too much of this medicine contact a poison control center or emergency room at once. NOTE: This medicine is only for you. Do not share this medicine with others. What if I miss a dose? If you miss a dose, take it as soon as you can. If it is almost time for your next dose, take only that dose. Do not take double or extra doses. What may interact with this medicine? This medicine may interact with the following: -aspirin and aspirin-like medicines -certain medicines for fungal infections like ketoconazole and itraconazole -certain medicines for seizures like carbamazepine and phenytoin -certain medicines that treat or prevent blood clots like warfarin, enoxaparin, and dalteparin -clarithromycin -NSAIDs, medicines for pain and inflammation, like  ibuprofen or naproxen -rifampin -ritonavir -St. John's wort This list may not describe all possible interactions. Give your health care provider a list of all the medicines, herbs, non-prescription drugs, or dietary supplements you use. Also tell them if you smoke, drink alcohol, or use illegal drugs. Some items may interact with your medicine. What should I watch for while using this medicine? Notify your doctor or health care professional and seek emergency treatment if you develop breathing problems; changes in vision; chest pain; severe, sudden headache; pain, swelling, warmth in the leg; trouble speaking; sudden numbness or weakness of the face, arm, or leg. These can be signs that your condition has gotten worse. If you are going to have surgery, tell your doctor or health care professional that you are taking this medicine. Tell your health care professional that you use this medicine before you have a spinal or epidural procedure. Sometimes people who take this medicine have bleeding problems around the spine when they have a spinal or epidural procedure. This bleeding is very rare. If you have a spinal or epidural procedure while on this medicine, call your health care professional immediately if you have back pain, numbness or tingling (especially in your legs and feet), muscle weakness, paralysis, or loss of bladder or bowel control.  Avoid sports and activities that might cause injury while you are using this medicine. Severe falls or injuries can cause unseen bleeding. Be careful when using sharp tools or knives. Consider using an Copy. Take special care brushing or flossing your teeth. Report any injuries, bruising, or red spots on the skin to your doctor or health care professional. What side effects may I notice from receiving this medicine? Side effects that you should report to your doctor or health care professional as soon as possible: -allergic reactions like skin rash,  itching or hives, swelling of the face, lips, or tongue -signs and symptoms of bleeding such as bloody or black, tarry stools; red or dark-brown urine; spitting up blood or brown material that looks like coffee grounds; red spots on the skin; unusual bruising or bleeding from the eye, gums, or nose This list may not describe all possible side effects. Call your doctor for medical advice about side effects. You may report side effects to FDA at 1-800-FDA-1088. Where should I keep my medicine? Keep out of the reach of children. Store at room temperature between 20 and 25 degrees C (68 and 77 degrees F). Throw away any unused medicine after the expiration date. NOTE: This sheet is a summary. It may not cover all possible information. If you have questions about this medicine, talk to your doctor, pharmacist, or health care provider.  2015, [employer]. (2013-08-02 11:59:24) Digoxin tablets or capsules What is this medicine? DIGOXIN (di JOX in) is used to treat congestive heart failure and heart rhythm problems. This medicine may be used for other purposes; ask your health care provider or pharmacist if you have questions. COMMON BRAND NAME(S): Digitek, Lanoxicaps, Lanoxin What should I tell my health care provider before I take this medicine? They need to know if you have any of these conditions: -certain heart rhythm disorders -heart disease or recent heart attack -kidney or liver disease -an unusual or allergic reaction to digoxin, other medicines, foods, dyes, or preservatives -pregnant or trying to get pregnant -breast-feeding How should I use this medicine? Take this medicine by mouth with a glass of water. Follow the directions on the prescription label. Take your doses at regular intervals. Do not take your medicine more often than directed. Talk to your pediatrician regarding the use of this medicine in children. Special care may be needed. Overdosage: If you think you have  taken too much of this medicine contact a poison control center or emergency room at once. NOTE: This medicine is only for you. Do not share this medicine with others. What if I miss a dose? If you miss a dose, take it as soon as you can. If it is almost time for your next dose, take only that dose. Do not take double or extra doses. What may interact with this medicine? -activated charcoal -albuterol -alprazolam -antacids -antiviral medicines for HIV or AIDS like ritonavir and saquinavir -calcium -certain antibiotics like azithromycin, clarithromycin, erythromycin, gentamicin, neomycin, trimethoprim, and tetracycline -certain medicines for blood pressure, heart disease, irregular heart beat -certain medicines for cancer -certain medicines for cholesterol like atorvastatin, cholestyramine, and colestipol -certain medicines for diabetes, like acarbose, exenatide, miglitol, and metformin -certain medicines for fungal infections like ketoconazole and itraconazole -certain medicines for stomach problems like omeprazole, esomeprazole, lansoprazole, rabeprazole, metoclopramide, and sucralfate -cyclosporine -diphenoxylate -epinephrine -kaolin; pectin -nefazodone -NSAIDS, medicines for pain and inflammation, like celecoxib, ibuprofen, or naproxen -penicillamine -phenytoin -propantheline -quinine -phenytoin -rifampin -succinylcholine -St. John's Wort -sulfasalazine -teriparatide -thyroid hormones -tolvaptan  This list may not describe all possible interactions. Give your health care provider a list of all the medicines, herbs, non-prescription drugs, or dietary supplements you use. Also tell them if you smoke, drink alcohol, or use illegal drugs. Some items may interact with your medicine. What should I watch for while using this medicine? Visit your doctor or health care professional for regular checks on your progress. Do not stop taking this medicine without the advice of your doctor  or health care professional, even if you feel better. Do not change the brand you are taking, other brands may affect you differently. Check your heart rate and blood pressure regularly while you are taking this medicine. Ask your doctor or health care professional what your heart rate and blood pressure should be, and when you should contact him or her. Your doctor or health care professional also may schedule regular blood tests and electrocardiograms to check your progress. Watch your diet. Less digoxin may be absorbed from the stomach if you have a diet high in bran fiber. Do not treat yourself for coughs, colds or allergies without asking your doctor or health care professional for advice. Some ingredients can increase possible side effects. What side effects may I notice from receiving this medicine? Side effects that you should report to your doctor or health care professional as soon as possible: -allergic reactions like skin rash, itching or hives, swelling of the face, lips, or tongue -changes in behavior, mood, or mental ability -changes in vision -confusion -fast, irregular heartbeat -feeling faint or lightheaded, falls -headache -nausea, vomiting -unusual bleeding, bruising -unusually weak or tired Side effects that usually do not require medical attention (report to your doctor or health care professional if they continue or are bothersome): -breast enlargement in men and women -diarrhea This list may not describe all possible side effects. Call your doctor for medical advice about side effects. You may report side effects to FDA at 1-800-FDA-1088. Where should I keep my medicine? Keep out of the reach of children. Store at room temperature between 15 and 30 degrees C (59 and 86 degrees F). Protect from light and moisture. Throw away any unused medicine after the expiration date. NOTE: This sheet is a summary. It may not cover all possible information. If you have questions about  this medicine, talk to your doctor, pharmacist, or health care provider.  2015, [employer]. (2013-02-07 12:40:27) Digoxin Toxicity Digoxin is a medicine that can help a weakened heart to function properly. Digoxin increases the strength of the heart muscle, helps to maintain a normal heart rhythm, and helps to remove excess water from the body. When there is too much digoxin in the body, it acts like a poison (toxin). This condition is called digoxin toxicity. CAUSES  Digoxin toxicity can occur due to an accidental overdose. It can also occur if you are taking the correct amount of digoxin but there are other factors affecting the digoxin levels in your body. These factors can include:  Taking other medicines that interact badly with digoxin.  Having low potassium or magnesium levels.  Having reduced kidney function. This prevents digoxin from leaving your body at the normal speed. SYMPTOMS  Confusion.  Changes in color vision (seeing more yellow color), blurred vision, increased sensitivity to light, or seeing flashing lights.  Irregular heartbeats (palpitations). These heartbeats may be too fast or too slow.  Loss of appetite, nausea, vomiting, or diarrhea. DIAGNOSIS  Blood tests may be done to check your digoxin, potassium, and  magnesium levels. Electrocardiography may also be done to record the electrical activity of your heart. TREATMENT  Digoxin toxicity is treated in the hospital. To lower your digoxin levels, you may be given a medicine called activated charcoal. Your health care provider may also perform gastric lavage. In this procedure, a tube is inserted through the mouth and into the stomach to clean out the stomach. PREVENTION  The following instructions can help prevent digoxin toxicity:  Take your digoxin medicine exactly as prescribed. If you miss a dose, take it as soon as you can. If it is nearly time for your next dose, take only that 1 dose. Do not take 2  doses at the same time.  Swallow your digoxin medicine with water. It is best to take digoxin on an empty stomach, at least 1 hour before a meal or 2 hours after a meal.  Take your digoxin doses at regular intervals. Do not take your medicine more often than directed.  Tell your health care provider about all other medicines you are taking, including over-the-counter medicines, nutritional supplements, or herbal products.  Check with your health care provider before stopping or starting any medicines.  Do not take antacids or over-the-counter medicines for pain, allergies, coughs, or colds without your health care provider's permission.  Tell your health care provider if you drink caffeine or alcohol, you smoke, or you use street drugs. This may affect the way your digoxin medicine works.  Talk to your health care provider about your diet. The amount of fiber you eat may affect the way your digoxin medicine works.  If you are going to have surgery, tell your surgeon that you are taking digoxin. HOME CARE INSTRUCTIONS  Check your heart rate and blood pressure regularly. Ask your health care provider what your heart rate and blood pressure should be and when you should contact him or her.  Keep all follow-up appointments as directed by your health care provider. You may need to have additional blood tests and an electrocardiogram. SEEK IMMEDIATE MEDICAL CARE IF:   You develop chest pain or shortness of breath.  You have fainting spells or a fast, irregular heartbeat.  You have a slow heartbeat (less than 50 beats per minute). MAKE SURE YOU:   Understand these instructions.  Will watch your condition.  Will get help right away if you are not doing well or get worse. Document Released: 11/18/2002 Document Revised: 04/14/2014 Document Reviewed: 01/11/2012 [facility] Patient Information 2015 [place], [place]. This information is not intended to replace advice given to you by your health  care provider. Make sure you discuss any questions you have with your health care provider. Metoprolol tablets What is this medicine? METOPROLOL (me TOE proe lole) is a beta-blocker. Beta-blockers reduce the workload on the heart and help it to beat more regularly. This medicine is used to treat high blood pressure and to prevent chest pain. It is also used to after a heart attack and to prevent an additional heart attack from occurring. This medicine may be used for other purposes; ask your health care provider or pharmacist if you have questions. COMMON BRAND NAME(S): Lopressor What should I tell my health care provider before I take this medicine? They need to know if you have any of these conditions: -diabetes -heart or vessel disease like slow heart rate, worsening heart failure, heart block, sick sinus syndrome or Raynaud's disease -kidney disease -liver disease -lung or breathing disease, like asthma or emphysema -pheochromocytoma -thyroid disease -an unusual  or allergic reaction to metoprolol, other beta-blockers, medicines, foods, dyes, or preservatives -pregnant or trying to get pregnant -breast-feeding How should I use this medicine? Take this medicine by mouth with a drink of water. Follow the directions on the prescription label. Take this medicine immediately after meals. Take your doses at regular intervals. Do not take more medicine than directed. Do not stop taking this medicine suddenly. This could lead to serious heart-related effects. Talk to your pediatrician regarding the use of this medicine in children. Special care may be needed. Overdosage: If you think you have taken too much of this medicine contact a poison control center or emergency room at once. NOTE: This medicine is only for you. Do not share this medicine with others. What if I miss a dose? If you miss a dose, take it as soon as you can. If it is almost time for your next dose, take only that dose. Do not  take double or extra doses. What may interact with this medicine? This medicine may interact with the following medications: -certain medicines for blood pressure, heart disease, irregular heart beat -certain medicines for depression like monoamine oxidase (MAO) inhibitors, fluoxetine, or paroxetine -clonidine -dobutamine -epinephrine -isoproterenol -reserpine This list may not describe all possible interactions. Give your health care provider a list of all the medicines, herbs, non-prescription drugs, or dietary supplements you use. Also tell them if you smoke, drink alcohol, or use illegal drugs. Some items may interact with your medicine. What should I watch for while using this medicine? Visit your doctor or health care professional for regular check ups. Contact your doctor right away if your symptoms worsen. Check your blood pressure and pulse rate regularly. Ask your health care professional what your blood pressure and pulse rate should be, and when you should contact them. You may get drowsy or dizzy. Do not drive, use machinery, or do anything that needs mental alertness until you know how this medicine affects you. Do not sit or stand up quickly, especially if you are an older patient. This reduces the risk of dizzy or fainting spells. Contact your doctor if these symptoms continue. Alcohol may interfere with the effect of this medicine. Avoid alcoholic drinks. What side effects may I notice from receiving this medicine? Side effects that you should report to your doctor or health care professional as soon as possible: -allergic reactions like skin rash, itching or hives -cold or numb hands or feet -depression -difficulty breathing -faint -fever with sore throat -irregular heartbeat, chest pain -rapid weight gain -swollen legs or ankles Side effects that usually do not require medical attention (report to your doctor or health care professional if they continue or are  bothersome): -anxiety or nervousness -change in sex drive or performance -dry skin -headache -nightmares or trouble sleeping -short term memory loss -stomach upset or diarrhea -unusually tired This list may not describe all possible side effects. Call your doctor for medical advice about side effects. You may report side effects to FDA at 1-800-FDA-1088. Where should I keep my medicine? Keep out of the reach of children. Store at room temperature between 15 and 30 degrees C (59 and 86 degrees F). Throw away any unused medicine after the expiration date. NOTE: This sheet is a summary. It may not cover all possible information. If you have questions about this medicine, talk to your doctor, pharmacist, or health care provider.  2015, Elsevier/Gold Standard. (2013-08-02 14:40:36)

## 2015-09-16 ENCOUNTER — Other Ambulatory Visit: Payer: Self-pay | Admitting: *Deleted

## 2015-09-16 ENCOUNTER — Ambulatory Visit
Admission: RE | Admit: 2015-09-16 | Discharge: 2015-09-16 | Disposition: A | Discharge status: Home / Self Care | Payer: Medicare Other | Source: Ambulatory Visit | Attending: Radiation Oncology | Admitting: Radiation Oncology

## 2015-09-16 DIAGNOSIS — E669 Obesity, unspecified: Secondary | ICD-10-CM | POA: Diagnosis not present

## 2015-09-16 DIAGNOSIS — I10 Essential (primary) hypertension: Secondary | ICD-10-CM | POA: Diagnosis not present

## 2015-09-16 DIAGNOSIS — Z87891 Personal history of nicotine dependence: Secondary | ICD-10-CM | POA: Diagnosis not present

## 2015-09-16 DIAGNOSIS — I2510 Atherosclerotic heart disease of native coronary artery without angina pectoris: Secondary | ICD-10-CM | POA: Diagnosis not present

## 2015-09-16 DIAGNOSIS — C160 Malignant neoplasm of cardia: Secondary | ICD-10-CM | POA: Diagnosis present

## 2015-09-16 DIAGNOSIS — Z809 Family history of malignant neoplasm, unspecified: Secondary | ICD-10-CM | POA: Diagnosis not present

## 2015-09-16 DIAGNOSIS — E119 Type 2 diabetes mellitus without complications: Secondary | ICD-10-CM | POA: Diagnosis not present

## 2015-09-16 DIAGNOSIS — Z803 Family history of malignant neoplasm of breast: Secondary | ICD-10-CM | POA: Diagnosis not present

## 2015-09-16 DIAGNOSIS — I739 Peripheral vascular disease, unspecified: Secondary | ICD-10-CM | POA: Diagnosis not present

## 2015-09-16 DIAGNOSIS — F419 Anxiety disorder, unspecified: Secondary | ICD-10-CM | POA: Diagnosis not present

## 2015-09-16 DIAGNOSIS — E785 Hyperlipidemia, unspecified: Secondary | ICD-10-CM | POA: Diagnosis not present

## 2015-09-16 DIAGNOSIS — Z510 Encounter for antineoplastic radiation therapy: Secondary | ICD-10-CM | POA: Diagnosis not present

## 2015-09-16 NOTE — Care Management Note (Signed)
Case Management Note  Patient Details  Name: UNDRA HARRIMAN MRN: [id number] Date of Birth: 09/28/1946  Subjective/Objective:  /today-Informed by nurse Baxter Flattery that patient went to pharmacy to fill Eliquis but unable due to cost.  Provided nurse w/Eliquis coupon 30day free trial for patient who will come to floor this am.                Action/Plan:   Expected Discharge Date:   (UNKNOWN)               Expected Discharge Plan:  [place]  In-House Referral:  NA  Discharge planning Services  CM Consult  Post Acute Care Choice:  NA Choice offered to:  Patient  DME Arranged:    DME Agency:     HH Arranged:  RN, PT, OT, Nurse's Aide [place] Agency:     Status of Service:  Completed, signed off  Medicare Important Message Given:  Yes-second notification given Date Medicare IM Given:    Medicare IM give by:    Date Additional Medicare IM Given:    Additional Medicare Important Message give by:     If discussed at [place] of [employer] Meetings, dates discussed:    Additional Comments:  Dessa Phi, RN 09/16/2015, 10:18 AM

## 2015-09-16 NOTE — Progress Notes (Signed)
Department of Radiation Oncology  [phone] Fax: [phone]  Weekly Treatment Note   Name: Walter Kohan ReeceDate: 09/16/2015 MRN: [id number] DOB: 09-22-1946   Current dose: 43.2 Gy  Current fraction:24   MEDICATIONS: Current Outpatient Prescriptions  Medication Sig Dispense Refill  . acetaminophen (TYLENOL) 500 MG tablet Take 1,000 mg by mouth every 6 (six) hours as needed for moderate pain or headache.    . allopurinol (ZYLOPRIM) 300 MG tablet Take 300 mg by mouth daily.     [name] atorvastatin (LIPITOR) 40 MG tablet Take 1 tablet (40 mg total) by mouth daily. 90 tablet 3  . digoxin (LANOXIN) 0.25 MG tablet Take 1 tablet (0.25 mg total) by mouth daily. 30 tablet 0  . glipiZIDE (GLUCOTROL) 10 MG tablet Take 10 mg by mouth 2 (two) times daily before a meal.     . hyaluronate sodium (RADIAPLEXRX) GEL Apply 1 application topically as needed.    [name] HYDROcodone-acetaminophen (NORCO/VICODIN) 5-325 MG per tablet TAKE 1 TABLET BY MOUTH 4 TIMES DAILY AS NEEDED FOR PAIN 60 tablet 0  . insulin aspart (NOVOLOG FLEXPEN) 100 UNIT/ML FlexPen Use the following scale to administer insulin depending on CBG   CBG 121 - 150: 2 units CBG 151 - 200:3 units CBG 201 - 250:5 units CBG 251 - 300:8 units CBG 301 - 350:11 units CBG 351 - 400:15 units 15 mL 1  . levothyroxine (SYNTHROID, LEVOTHROID) 75 MCG tablet Take 75 mcg by mouth daily before breakfast.   0  . metoprolol (LOPRESSOR) 50 MG tablet Take 1 tablet (50 mg total) by mouth 3 (three) times daily. 90 tablet 0  . PRESCRIPTION MEDICATION Chemo at [facility]    . prochlorperazine (COMPAZINE) 10 MG  tablet Take 1 tablet (10 mg total) by mouth every 6 (six) hours as needed for nausea or vomiting. 30 tablet 1  . ranitidine (ZANTAC) 300 MG tablet Take 1 tablet (300 mg total) by mouth daily as needed for heartburn. 30 tablet 6  . sucralfate (CARAFATE) 1 G tablet Take 1 tablet (1 g total) by mouth 4 (four) times daily. 120 tablet 2  . traMADol (ULTRAM) 50 MG tablet Take 1 tablet (50 mg total) by mouth every 6 (six) hours as needed for moderate pain. 30 tablet 0  . apixaban (ELIQUIS) 5 MG TABS tablet Take 1 tablet (5 mg total) by mouth 2 (two) times daily. (Patient not taking: Reported on 09/16/2015) 60 tablet 0  . dexamethasone (DECADRON) 4 MG tablet Take 10 mg at 10pm..... night before chemotherapy (Patient not taking: Reported on 09/16/2015) 10 tablet 0  . Liraglutide (VICTOZA) 18 MG/3ML SOPN Inject 1.8 mg into the skin daily.    . nitroGLYCERIN (NITROSTAT) 0.4 MG SL tablet Place 1 tablet (0.4 mg total) under the tongue every 5 (five) minutes x 3 doses as needed for chest pain. (Patient not taking: Reported on 09/16/2015) 25 tablet 3   No current facility-administered medications for this encounter.     ALLERGIES: Levaquin; Adhesive; and Morphine and related   LABORATORY DATA:   Recent Labs    Lab Results  Component Value Date   WBC 4.1 09/15/2015   HGB 11.3* 09/15/2015   HCT 31.7* 09/15/2015   MCV 80.9 09/15/2015   PLT 79* 09/15/2015      Recent Labs    Lab Results  Component Value Date   NA 136 09/14/2015   K 3.7 09/14/2015   CL 106 09/14/2015   CO2 26 09/14/2015      Recent Labs  Lab Results  Component Value Date   ALT 8* 09/11/2015   AST 19 09/11/2015   ALKPHOS 73 09/11/2015   BILITOT 1.3* 09/11/2015       NARRATIVE: Walter French was seen today for weekly treatment management. The chart was checked and the patient's films were reviewed.  Weekly rad txs  esophagus 24 completed, no difficulty swallowing , appetite poor, Not drinking glucerna only gator ade and water, ice tea, , no nausea, chemo has been stopped D/c hospital yesterday, rx eliquis to be picked up today, weak and fatigued, no pain  PHYSICAL EXAMINATION: weight is 231 lb 8 oz (105.008 kg). His oral temperature is 97.6 F (36.4 C). His blood pressure is 117/63 and his pulse is 95. His respiration is 20 and oxygen saturation is 97%.    ASSESSMENT: The patient is doing satisfactorily with treatment.  PLAN: We will continue with the patient's radiation treatment as planned.   ------------------------------------------------  Jodelle Gross, MD, PhD  This document serves as a record of services personally performed by Kyung Rudd, MD. It was created on his behalf by Derek Mound, a trained medical scribe. The creation of this record is based on the scribe's personal observations and the provider's statements to them. This document has been checked and approved by the attending provider.

## 2015-09-16 NOTE — Progress Notes (Signed)
Weekly rad txs esophagus 24 completed, no difficulty swallowing  , appetite poor,  Not drinking glucerna only gator ade and water, ice tea, , no nausea, chemo has been stopped  D/c hospital yesterday, rx eliquis to be picked up today, weak and fatigued, no pain 10:21 AM BP 117/63 mmHg  Pulse 95  Temp(Src) 97.6 F (36.4 C) (Oral)  Resp 20  Wt 231 lb 8 oz (105.008 kg)  SpO2 97%  Wt Readings from Last 3 Encounters:  09/16/15 231 lb 8 oz (105.008 kg)  09/15/15 233 lb 4 oz (105.8 kg)  09/09/15 229 lb 3.2 oz (103.964 kg)

## 2015-09-17 ENCOUNTER — Other Ambulatory Visit: Payer: Medicare Other

## 2015-09-17 ENCOUNTER — Encounter: Payer: Medicare Other | Admitting: Nutrition

## 2015-09-17 ENCOUNTER — Ambulatory Visit: Payer: Medicare Other | Admitting: Oncology

## 2015-09-17 ENCOUNTER — Ambulatory Visit
Admission: RE | Admit: 2015-09-17 | Discharge: 2015-09-17 | Disposition: A | Discharge status: Home / Self Care | Payer: Medicare Other | Source: Ambulatory Visit | Attending: Radiation Oncology | Admitting: Radiation Oncology

## 2015-09-17 ENCOUNTER — Ambulatory Visit: Payer: Medicare Other

## 2015-09-17 ENCOUNTER — Telehealth: Payer: Self-pay | Admitting: *Deleted

## 2015-09-17 ENCOUNTER — Telehealth: Payer: Self-pay | Admitting: Oncology

## 2015-09-17 DIAGNOSIS — Z510 Encounter for antineoplastic radiation therapy: Secondary | ICD-10-CM | POA: Diagnosis not present

## 2015-09-17 NOTE — Telephone Encounter (Signed)
s.w. pt and confirmed OCT appt.Walter KitchenMarland KitchenMarland KitchenMarland Kitchenpt ok and aware

## 2015-09-17 NOTE — Telephone Encounter (Signed)
Per staff message and POF I have scheduled appts. Advised scheduler of appts. JMW  

## 2015-09-18 ENCOUNTER — Inpatient Hospital Stay
Admission: RE | Admit: 2015-09-18 | Discharge: 2015-09-18 | Disposition: A | Discharge status: Home / Self Care | Payer: Self-pay | Source: Ambulatory Visit | Attending: Radiation Oncology | Admitting: Radiation Oncology

## 2015-09-18 ENCOUNTER — Ambulatory Visit
Admission: RE | Admit: 2015-09-18 | Discharge: 2015-09-18 | Disposition: A | Discharge status: Home / Self Care | Payer: Medicare Other | Source: Ambulatory Visit | Attending: Radiation Oncology | Admitting: Radiation Oncology

## 2015-09-18 DIAGNOSIS — Z510 Encounter for antineoplastic radiation therapy: Secondary | ICD-10-CM | POA: Diagnosis not present

## 2015-09-21 ENCOUNTER — Ambulatory Visit: Payer: Medicare Other

## 2015-09-21 ENCOUNTER — Ambulatory Visit (HOSPITAL_BASED_OUTPATIENT_CLINIC_OR_DEPARTMENT_OTHER): Payer: Medicare Other | Admitting: Physician Assistant

## 2015-09-21 ENCOUNTER — Other Ambulatory Visit (HOSPITAL_BASED_OUTPATIENT_CLINIC_OR_DEPARTMENT_OTHER): Payer: Medicare Other

## 2015-09-21 ENCOUNTER — Ambulatory Visit
Admission: RE | Admit: 2015-09-21 | Discharge: 2015-09-21 | Disposition: A | Discharge status: Home / Self Care | Payer: Medicare Other | Source: Ambulatory Visit | Attending: Radiation Oncology | Admitting: Radiation Oncology

## 2015-09-21 ENCOUNTER — Telehealth: Payer: Self-pay | Admitting: Oncology

## 2015-09-21 VITALS — BP 116/70 | HR 110 | Temp 98.5°F | Resp 19 | Ht 72.0 in | Wt 228.2 lb

## 2015-09-21 DIAGNOSIS — D630 Anemia in neoplastic disease: Secondary | ICD-10-CM

## 2015-09-21 DIAGNOSIS — C155 Malignant neoplasm of lower third of esophagus: Secondary | ICD-10-CM

## 2015-09-21 DIAGNOSIS — C160 Malignant neoplasm of cardia: Secondary | ICD-10-CM

## 2015-09-21 DIAGNOSIS — E119 Type 2 diabetes mellitus without complications: Secondary | ICD-10-CM | POA: Diagnosis not present

## 2015-09-21 DIAGNOSIS — Z510 Encounter for antineoplastic radiation therapy: Secondary | ICD-10-CM | POA: Diagnosis not present

## 2015-09-21 DIAGNOSIS — I4891 Unspecified atrial fibrillation: Secondary | ICD-10-CM

## 2015-09-21 DIAGNOSIS — R110 Nausea: Secondary | ICD-10-CM | POA: Diagnosis not present

## 2015-09-21 LAB — COMPREHENSIVE METABOLIC PANEL (CC13)
AST: 26 U/L (ref 5–34)
Albumin: 2.9 g/dL — ABNORMAL LOW (ref 3.5–5.0)
Alkaline Phosphatase: 121 U/L (ref 40–150)
Anion Gap: 8 mEq/L (ref 3–11)
BUN: 11.4 mg/dL (ref 7.0–26.0)
CHLORIDE: 102 meq/L (ref 98–109)
CO2: 26 meq/L (ref 22–29)
CREATININE: 0.9 mg/dL (ref 0.7–1.3)
Calcium: 8.9 mg/dL (ref 8.4–10.4)
EGFR: 85 mL/min/{1.73_m2} — ABNORMAL LOW (ref >90)
Glucose: 148 mg/dl — ABNORMAL HIGH (ref 70–140)
Potassium: 3.5 mEq/L (ref 3.5–5.1)
SODIUM: 136 meq/L (ref 136–145)
Total Bilirubin: 1.88 mg/dL — ABNORMAL HIGH (ref 0.20–1.20)
Total Protein: 6.1 g/dL — ABNORMAL LOW (ref 6.4–8.3)

## 2015-09-21 LAB — CBC WITH DIFFERENTIAL/PLATELET
BASO%: 0.4 % (ref 0.0–2.0)
Basophils Absolute: 0 10*3/uL (ref 0.0–0.1)
EOS%: 2.5 % (ref 0.0–7.0)
Eosinophils Absolute: 0.1 10*3/uL (ref 0.0–0.5)
HCT: 29.6 % — ABNORMAL LOW (ref 38.4–49.9)
HGB: 10 g/dL — ABNORMAL LOW (ref 13.0–17.1)
LYMPH#: 0.3 10*3/uL — AB (ref 0.9–3.3)
LYMPH%: 7.2 % — AB (ref 14.0–49.0)
MCH: 27.9 pg (ref 27.2–33.4)
MCHC: 33.6 g/dL (ref 32.0–36.0)
MCV: 82.8 fL (ref 79.3–98.0)
MONO#: 0.8 10*3/uL (ref 0.1–0.9)
MONO%: 17.1 % — AB (ref 0.0–14.0)
NEUT#: 3.2 10*3/uL (ref 1.5–6.5)
NEUT%: 72.8 % (ref 39.0–75.0)
Platelets: 95 10*3/uL — ABNORMAL LOW (ref 140–400)
RBC: 3.57 10*6/uL — AB (ref 4.20–5.82)
RDW: 18.1 % — ABNORMAL HIGH (ref 11.0–14.6)
WBC: 4.4 10*3/uL (ref 4.0–10.3)

## 2015-09-21 MED ORDER — ONDANSETRON HCL 8 MG PO TABS: 8.0000 mg | ORAL_TABLET | Freq: Three times a day (TID) | ORAL | Status: DC | PRN

## 2015-09-21 NOTE — Progress Notes (Signed)
  [place] OFFICE PROGRESS NOTE   Diagnosis:  GE junction carcinoma  INTERVAL HISTORY:   Mr. Jacquin returns as scheduled following a recent hospitalization from 09/10/15 to 09/15/15 due to Acute Hypoxic Respiratory Failure and Sepsis as well as new onset of Atrial Fibrillation, now rate controlled. He is feeling better since discharge. He denies any dizziness or presyncopal symptoms. He denies any fevers, chills or night sweats. He continues to have intermittent nausea, not improved with compazine. No mouth sores. He denies any diarrhea.His right upper quadrant pain is resolved. No numbness or tingling in his hands or feet. He denies odynophagia. He continues to note regurgitation of solids. He is tolerating room temperature fluids without difficulty. He denies any bleeding. To date he has completed week 4 Taxol/carboplatin on 09/03/2015. He continues to receive radiation  Objective:  Vital signs in last 24 hours:  Blood pressure 116/70, pulse 110, temperature 98.5 F (36.9 C), temperature source Oral, resp. rate 19, height 6' (1.829 m), weight 228 lb 3.2 oz (103.511 kg), SpO2 100 %.    HEENT: No thrush or ulcers. Mucous membranes are moist. Resp: Lungs clear bilaterally. Cardio: Irregularly regular rate and rhythm  GI: Abdomen soft and nontender. No hepatomegaly. Vascular: trace leg edema.   Lab Results:  Lab Results  Component Value Date   WBC 4.4 09/21/2015   HGB 10.0* 09/21/2015   HCT 29.6* 09/21/2015   MCV 82.8 09/21/2015   PLT 95* 09/21/2015   NEUTROABS 3.2 09/21/2015    Imaging:  No results found.  Medications: Reviewed with patient  Assessment/Plan: 1. Adenocarcinoma the gastroesophageal junction, status post an endoscopic biopsy of a distal esophagus mass and gastric cardia ulcer on 07/23/2015  Staging CT scans consistent with locally metastatic adenopathy  PET scan 08/07/2015 with hypermetabolic mass at the distal esophagus/GE junction and a  hypermetabolic paraesophageal, gastrohepatic, and portacaval lymph nodes  Initiation of radiation 08/12/2015, cycle 1 Taxol/carboplatin 08/13/2015. S/p D1C4 on 09/03/15 2. History of coronary artery disease 3. Congestive heart failure 4. History of ventricular tachycardia and supraventricular tachycardia 5. Peripheral arterial disease 6. Implantable defibrillator 7. Diabetes 8. Gout 9. Hospitalization from 09/10/15 to 09/15/15 due to Acute Hypoxic Respiratory Failure and Sepsis as well as new onset of Atrial Fibrillation, rate controlled 10. Thrombocytopenia 11. 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 Asfaw appears stable. He continues radiation, last dose expected to be received on 10/13. He has completed 4 weekly treatments with Taxol/carboplatin. He will complete a final treatment with Taxol/carboplatin on 09/22/2015.   Nausea: Will prescribe Zofran 8 mg q 8 hrs prn as Compazine has not been therapeutic  Atrial Fibrillation: He appears rate controlled. He is on Eliquis as per cardiologist without any bleeding issues noted.  Plan reviewed with Dr. Benay Spice.    [facility]  PA-C  09/21/2015  10:20 AM  This was a shared visit with  Sare Jakhiya Brower. Mr. Marxen was interviewed and examined. He is recovering from the admission with atrial fibrillation and respiratory failure. The upper abdominal pain has improved with chemotherapy/radiation. The plan is to complete a final week of Taxol/carboplatin and radiation over the next few days.  Julieanne Manson, M.D.

## 2015-09-21 NOTE — Telephone Encounter (Signed)
Gave adn pritned aptp sched and avs for pt for OCT

## 2015-09-21 NOTE — Progress Notes (Signed)
At [place] this nurse called order to patient's pharmacy voicemail.

## 2015-09-22 ENCOUNTER — Ambulatory Visit (HOSPITAL_BASED_OUTPATIENT_CLINIC_OR_DEPARTMENT_OTHER): Payer: Medicare Other

## 2015-09-22 ENCOUNTER — Ambulatory Visit
Admission: RE | Admit: 2015-09-22 | Discharge: 2015-09-22 | Disposition: A | Discharge status: Home / Self Care | Payer: Medicare Other | Source: Ambulatory Visit | Attending: Radiation Oncology | Admitting: Radiation Oncology

## 2015-09-22 VITALS — BP 112/66 | HR 91 | Resp 18

## 2015-09-22 DIAGNOSIS — Z5111 Encounter for antineoplastic chemotherapy: Secondary | ICD-10-CM | POA: Diagnosis not present

## 2015-09-22 DIAGNOSIS — R110 Nausea: Secondary | ICD-10-CM

## 2015-09-22 DIAGNOSIS — C160 Malignant neoplasm of cardia: Secondary | ICD-10-CM

## 2015-09-22 DIAGNOSIS — Z510 Encounter for antineoplastic radiation therapy: Secondary | ICD-10-CM | POA: Diagnosis not present

## 2015-09-22 MED ORDER — HEPARIN SOD (PORK) LOCK FLUSH 100 UNIT/ML IV SOLN
500.0000 [IU] | Freq: Once | INTRAVENOUS | Status: DC | PRN
  Filled 2015-09-22: qty 5

## 2015-09-22 MED ORDER — FAMOTIDINE IN NACL 20-0.9 MG/50ML-% IV SOLN
INTRAVENOUS | Status: AC
  Filled 2015-09-22: qty 50

## 2015-09-22 MED ORDER — SODIUM CHLORIDE 0.9 % IV SOLN
240.0000 mg | Freq: Once | INTRAVENOUS | Status: AC
  Administered 2015-09-22: 240 mg via INTRAVENOUS
  Filled 2015-09-22: qty 24

## 2015-09-22 MED ORDER — FAMOTIDINE IN NACL 20-0.9 MG/50ML-% IV SOLN
20.0000 mg | Freq: Once | INTRAVENOUS | Status: AC
  Administered 2015-09-22: 20 mg via INTRAVENOUS

## 2015-09-22 MED ORDER — SODIUM CHLORIDE 0.9 % IJ SOLN
10.0000 mL | INTRAMUSCULAR | Status: DC | PRN
  Filled 2015-09-22: qty 10

## 2015-09-22 MED ORDER — SODIUM CHLORIDE 0.9 % IV SOLN
Freq: Once | INTRAVENOUS | Status: AC
  Administered 2015-09-22: 11:00:00 via INTRAVENOUS
  Filled 2015-09-22: qty 8

## 2015-09-22 MED ORDER — DIPHENHYDRAMINE HCL 50 MG/ML IJ SOLN
INTRAMUSCULAR | Status: AC
  Filled 2015-09-22: qty 1

## 2015-09-22 MED ORDER — SODIUM CHLORIDE 0.9 % IV SOLN
50.0000 mg/m2 | Freq: Once | INTRAVENOUS | Status: AC
  Administered 2015-09-22: 120 mg via INTRAVENOUS
  Filled 2015-09-22: qty 20

## 2015-09-22 MED ORDER — DIPHENHYDRAMINE HCL 50 MG/ML IJ SOLN
25.0000 mg | Freq: Once | INTRAMUSCULAR | Status: AC
  Administered 2015-09-22: 25 mg via INTRAVENOUS

## 2015-09-22 MED ORDER — SODIUM CHLORIDE 0.9 % IV SOLN
Freq: Once | INTRAVENOUS | Status: AC
  Administered 2015-09-22: 11:00:00 via INTRAVENOUS

## 2015-09-22 NOTE — Progress Notes (Signed)
Per Dr. Benay Spice, okay to tx with CBC/CMET results 09/21/15

## 2015-09-22 NOTE — Patient Instructions (Signed)
[facility] Discharge Instructions for Patients Receiving Chemotherapy  Today you received the following chemotherapy agents: Taxol and Carboplatin.  To help prevent nausea and vomiting after your treatment, we encourage you to take your nausea medication: Compazine. Take one every 6 hours as needed. If you develop nausea and vomiting that is not controlled by your nausea medication, call the clinic.   BELOW ARE SYMPTOMS THAT SHOULD BE REPORTED IMMEDIATELY:  *FEVER GREATER THAN 100.5 F  *CHILLS WITH OR WITHOUT FEVER  NAUSEA AND VOMITING THAT IS NOT CONTROLLED WITH YOUR NAUSEA MEDICATION  *UNUSUAL SHORTNESS OF BREATH  *UNUSUAL BRUISING OR BLEEDING  TENDERNESS IN MOUTH AND THROAT WITH OR WITHOUT PRESENCE OF ULCERS  *URINARY PROBLEMS  *BOWEL PROBLEMS  UNUSUAL RASH Items with * indicate a potential emergency and should be followed up as soon as possible.  Feel free to call the clinic should you have any questions or concerns. The clinic phone number is [phone].  Please show the CHEMO ALERT CARD at check-in to the Emergency Department and triage nurse.   

## 2015-09-23 ENCOUNTER — Ambulatory Visit: Payer: Medicare Other

## 2015-09-23 ENCOUNTER — Ambulatory Visit
Admission: RE | Admit: 2015-09-23 | Discharge: 2015-09-23 | Disposition: A | Discharge status: Home / Self Care | Payer: Medicare Other | Source: Ambulatory Visit | Attending: Radiation Oncology | Admitting: Radiation Oncology

## 2015-09-23 DIAGNOSIS — Z510 Encounter for antineoplastic radiation therapy: Secondary | ICD-10-CM | POA: Diagnosis not present

## 2015-09-24 ENCOUNTER — Ambulatory Visit
Admission: RE | Admit: 2015-09-24 | Discharge: 2015-09-24 | Disposition: A | Discharge status: Home / Self Care | Payer: Medicare Other | Source: Ambulatory Visit | Attending: Radiation Oncology | Admitting: Radiation Oncology

## 2015-09-24 ENCOUNTER — Ambulatory Visit: Payer: Medicare Other

## 2015-09-24 ENCOUNTER — Encounter: Payer: Self-pay | Admitting: Radiation Oncology

## 2015-09-24 ENCOUNTER — Encounter: Payer: Self-pay | Admitting: *Deleted

## 2015-09-24 DIAGNOSIS — Z510 Encounter for antineoplastic radiation therapy: Secondary | ICD-10-CM | POA: Diagnosis not present

## 2015-09-24 NOTE — Progress Notes (Signed)
Oncology Nurse Navigator Documentation  Oncology Nurse Navigator Flowsheets 09/24/2015  Referral date to RadOnc/MedOnc -  Navigator Encounter Type Treatment  Patient Visit Type Radonc  Treatment Phase Final Radiation Tx  Barriers/Navigation Needs No barriers at this time  Education -  Interventions -  Referrals -  Education Method Verbal-stay active; will take few months to get energy level back to baseline  Support Groups/Services -  Time Spent with Patient 10  Spoke with patient prior to last RT. Denies pain. Able to swallow solids OK. Reports hypersalivation, which can gag him at times. Still able to drive himself and ambulate in home and care for himself. Using wheelchair due to long walk to RT from lobby. Reminded him of appointment with medical oncology on 10/05/15.

## 2015-09-24 NOTE — Progress Notes (Signed)
[name] is here for his last fraction of radiation for neoplasm of cardia (30/30 total).  Mr. Hemmer reports he is tired, but still able to complete his normal activities. He reports taking naps more often. He does report a decreased appetite, and is not eating well. He denies shortness of breath at this time. He denies any pain at this time. His skin is normal, intact at radiation site.  BP 103/61 mmHg  Pulse 103  Temp(Src) 98.8 F (37.1 C)  Ht 6' (1.829 m)  Wt 226 lb 11.2 oz (102.83 kg)  BMI 30.74 kg/m2  SpO2 96%  Wt Readings from Last 3 Encounters:  09/24/15 226 lb 11.2 oz (102.83 kg)  09/21/15 228 lb 3.2 oz (103.511 kg)  09/16/15 231 lb 8 oz (105.008 kg)

## 2015-09-24 NOTE — Progress Notes (Signed)
Asked by Dr. Tammi Klippel to call Walter French's primary MD to ask about holding his Metropolol and Diovan because of his BP of 103/61 and pulse 103 at his final radiation treatment check up today. I have called and spoken with Dr. Vista Lawman' (his primary doctor) nurse and am currently awaiting a call back regarding the decision about the BP medicine.  Dr. Vista Lawman' nurse called me back today, and stated for Walter French to hold his metoprolol unless his blood pressure stays consistently greater than 875 systolic. I called and spoke with Walter French and relayed this message, and he stated he understood and would follow these new directions.

## 2015-09-25 ENCOUNTER — Encounter: Payer: Self-pay | Admitting: Oncology

## 2015-09-25 NOTE — Progress Notes (Signed)
i sent req for prior auth for ondansetron

## 2015-09-28 ENCOUNTER — Telehealth: Payer: Self-pay | Admitting: Cardiology

## 2015-09-28 ENCOUNTER — Encounter: Payer: Medicare Other | Admitting: *Deleted

## 2015-09-28 NOTE — Telephone Encounter (Signed)
Spoke with pt and reminded pt of remote transmission that is due today. Pt verbalized understanding.   

## 2015-09-29 ENCOUNTER — Encounter: Payer: Self-pay | Admitting: Cardiology

## 2015-09-30 ENCOUNTER — Encounter (HOSPITAL_COMMUNITY): Payer: Self-pay | Admitting: Emergency Medicine

## 2015-09-30 ENCOUNTER — Emergency Department (HOSPITAL_COMMUNITY): Payer: Medicare Other

## 2015-09-30 ENCOUNTER — Inpatient Hospital Stay (HOSPITAL_COMMUNITY)
Admission: EM | Admit: 2015-09-30 | Discharge: 2015-10-02 | DRG: 871 | Disposition: A | Discharge status: Home / Self Care | Payer: Medicare Other | Attending: Internal Medicine | Admitting: Internal Medicine

## 2015-09-30 DIAGNOSIS — R5381 Other malaise: Secondary | ICD-10-CM

## 2015-09-30 DIAGNOSIS — Z7901 Long term (current) use of anticoagulants: Secondary | ICD-10-CM

## 2015-09-30 DIAGNOSIS — Z6829 Body mass index (BMI) 29.0-29.9, adult: Secondary | ICD-10-CM

## 2015-09-30 DIAGNOSIS — T451X5A Adverse effect of antineoplastic and immunosuppressive drugs, initial encounter: Secondary | ICD-10-CM | POA: Diagnosis present

## 2015-09-30 DIAGNOSIS — J45909 Unspecified asthma, uncomplicated: Secondary | ICD-10-CM | POA: Diagnosis present

## 2015-09-30 DIAGNOSIS — G43A1 Cyclical vomiting, intractable: Secondary | ICD-10-CM

## 2015-09-30 DIAGNOSIS — I4819 Other persistent atrial fibrillation: Secondary | ICD-10-CM

## 2015-09-30 DIAGNOSIS — E669 Obesity, unspecified: Secondary | ICD-10-CM | POA: Diagnosis present

## 2015-09-30 DIAGNOSIS — E785 Hyperlipidemia, unspecified: Secondary | ICD-10-CM | POA: Diagnosis present

## 2015-09-30 DIAGNOSIS — R05 Cough: Secondary | ICD-10-CM | POA: Diagnosis not present

## 2015-09-30 DIAGNOSIS — I4891 Unspecified atrial fibrillation: Secondary | ICD-10-CM | POA: Diagnosis present

## 2015-09-30 DIAGNOSIS — F419 Anxiety disorder, unspecified: Secondary | ICD-10-CM | POA: Diagnosis present

## 2015-09-30 DIAGNOSIS — Z85028 Personal history of other malignant neoplasm of stomach: Secondary | ICD-10-CM

## 2015-09-30 DIAGNOSIS — A419 Sepsis, unspecified organism: Secondary | ICD-10-CM | POA: Diagnosis not present

## 2015-09-30 DIAGNOSIS — E1122 Type 2 diabetes mellitus with diabetic chronic kidney disease: Secondary | ICD-10-CM | POA: Diagnosis present

## 2015-09-30 DIAGNOSIS — C160 Malignant neoplasm of cardia: Secondary | ICD-10-CM | POA: Diagnosis present

## 2015-09-30 DIAGNOSIS — R7989 Other specified abnormal findings of blood chemistry: Secondary | ICD-10-CM | POA: Diagnosis present

## 2015-09-30 DIAGNOSIS — Z79891 Long term (current) use of opiate analgesic: Secondary | ICD-10-CM

## 2015-09-30 DIAGNOSIS — R112 Nausea with vomiting, unspecified: Secondary | ICD-10-CM | POA: Diagnosis present

## 2015-09-30 DIAGNOSIS — E43 Unspecified severe protein-calorie malnutrition: Secondary | ICD-10-CM | POA: Diagnosis present

## 2015-09-30 DIAGNOSIS — J9811 Atelectasis: Secondary | ICD-10-CM | POA: Diagnosis present

## 2015-09-30 DIAGNOSIS — Z8249 Family history of ischemic heart disease and other diseases of the circulatory system: Secondary | ICD-10-CM

## 2015-09-30 DIAGNOSIS — Z803 Family history of malignant neoplasm of breast: Secondary | ICD-10-CM

## 2015-09-30 DIAGNOSIS — E860 Dehydration: Secondary | ICD-10-CM

## 2015-09-30 DIAGNOSIS — I959 Hypotension, unspecified: Secondary | ICD-10-CM | POA: Diagnosis present

## 2015-09-30 DIAGNOSIS — G4733 Obstructive sleep apnea (adult) (pediatric): Secondary | ICD-10-CM | POA: Diagnosis present

## 2015-09-30 DIAGNOSIS — N183 Chronic kidney disease, stage 3 unspecified: Secondary | ICD-10-CM | POA: Diagnosis present

## 2015-09-30 DIAGNOSIS — M109 Gout, unspecified: Secondary | ICD-10-CM | POA: Diagnosis present

## 2015-09-30 DIAGNOSIS — I482 Chronic atrial fibrillation: Secondary | ICD-10-CM | POA: Diagnosis present

## 2015-09-30 DIAGNOSIS — I130 Hypertensive heart and chronic kidney disease with heart failure and stage 1 through stage 4 chronic kidney disease, or unspecified chronic kidney disease: Secondary | ICD-10-CM | POA: Diagnosis present

## 2015-09-30 DIAGNOSIS — N390 Urinary tract infection, site not specified: Secondary | ICD-10-CM | POA: Diagnosis present

## 2015-09-30 DIAGNOSIS — K219 Gastro-esophageal reflux disease without esophagitis: Secondary | ICD-10-CM | POA: Diagnosis present

## 2015-09-30 DIAGNOSIS — E1129 Type 2 diabetes mellitus with other diabetic kidney complication: Secondary | ICD-10-CM | POA: Diagnosis present

## 2015-09-30 DIAGNOSIS — I2510 Atherosclerotic heart disease of native coronary artery without angina pectoris: Secondary | ICD-10-CM | POA: Diagnosis present

## 2015-09-30 DIAGNOSIS — E1151 Type 2 diabetes mellitus with diabetic peripheral angiopathy without gangrene: Secondary | ICD-10-CM | POA: Diagnosis present

## 2015-09-30 DIAGNOSIS — Z923 Personal history of irradiation: Secondary | ICD-10-CM

## 2015-09-30 DIAGNOSIS — R531 Weakness: Secondary | ICD-10-CM

## 2015-09-30 DIAGNOSIS — I4581 Long QT syndrome: Secondary | ICD-10-CM | POA: Diagnosis present

## 2015-09-30 DIAGNOSIS — Z95810 Presence of automatic (implantable) cardiac defibrillator: Secondary | ICD-10-CM

## 2015-09-30 DIAGNOSIS — D61810 Antineoplastic chemotherapy induced pancytopenia: Secondary | ICD-10-CM | POA: Diagnosis present

## 2015-09-30 DIAGNOSIS — I5042 Chronic combined systolic (congestive) and diastolic (congestive) heart failure: Secondary | ICD-10-CM | POA: Diagnosis present

## 2015-09-30 DIAGNOSIS — N179 Acute kidney failure, unspecified: Secondary | ICD-10-CM | POA: Diagnosis present

## 2015-09-30 DIAGNOSIS — Z8701 Personal history of pneumonia (recurrent): Secondary | ICD-10-CM

## 2015-09-30 DIAGNOSIS — E1121 Type 2 diabetes mellitus with diabetic nephropathy: Secondary | ICD-10-CM | POA: Diagnosis present

## 2015-09-30 DIAGNOSIS — Z809 Family history of malignant neoplasm, unspecified: Secondary | ICD-10-CM

## 2015-09-30 DIAGNOSIS — E039 Hypothyroidism, unspecified: Secondary | ICD-10-CM | POA: Diagnosis not present

## 2015-09-30 DIAGNOSIS — Z79899 Other long term (current) drug therapy: Secondary | ICD-10-CM

## 2015-09-30 DIAGNOSIS — J209 Acute bronchitis, unspecified: Secondary | ICD-10-CM | POA: Diagnosis present

## 2015-09-30 DIAGNOSIS — Z951 Presence of aortocoronary bypass graft: Secondary | ICD-10-CM

## 2015-09-30 DIAGNOSIS — D649 Anemia, unspecified: Secondary | ICD-10-CM

## 2015-09-30 DIAGNOSIS — R5383 Other fatigue: Secondary | ICD-10-CM

## 2015-09-30 DIAGNOSIS — Z87891 Personal history of nicotine dependence: Secondary | ICD-10-CM

## 2015-09-30 DIAGNOSIS — R059 Cough, unspecified: Secondary | ICD-10-CM | POA: Diagnosis present

## 2015-09-30 DIAGNOSIS — Z794 Long term (current) use of insulin: Secondary | ICD-10-CM

## 2015-09-30 HISTORY — DX: Unspecified atrial fibrillation: I48.91

## 2015-09-30 LAB — BLOOD GAS, ARTERIAL
Acid-Base Excess: 1.4 mmol/L (ref 0.0–2.0)
Bicarbonate: 24.3 mEq/L — ABNORMAL HIGH (ref 20.0–24.0)
Drawn by: 11249
O2 Content: 2 L/min
O2 Saturation: 98.5 %
PCO2 ART: 31.8 mmHg — AB (ref 35.0–45.0)
PH ART: 7.492 — AB (ref 7.350–7.450)
PO2 ART: 109 mmHg — AB (ref 80.0–100.0)
Patient temperature: 97.3
TCO2: 22.6 mmol/L (ref 0–100)

## 2015-09-30 LAB — COMPREHENSIVE METABOLIC PANEL
ALBUMIN: 3 g/dL — AB (ref 3.5–5.0)
ALK PHOS: 112 U/L (ref 38–126)
ALT: 9 U/L — ABNORMAL LOW (ref 17–63)
ANION GAP: 7 (ref 5–15)
AST: 35 U/L (ref 15–41)
BUN: 16 mg/dL (ref 6–20)
CALCIUM: 8.7 mg/dL — AB (ref 8.9–10.3)
CHLORIDE: 99 mmol/L — AB (ref 101–111)
CO2: 26 mmol/L (ref 22–32)
Creatinine, Ser: 1.28 mg/dL — ABNORMAL HIGH (ref 0.61–1.24)
GFR calc Af Amer: >60 mL/min (ref >60)
GFR calc non Af Amer: 57 mL/min — ABNORMAL LOW (ref >60)
GLUCOSE: 125 mg/dL — AB (ref 65–99)
Potassium: 4 mmol/L (ref 3.5–5.1)
SODIUM: 132 mmol/L — AB (ref 135–145)
Total Bilirubin: 2.2 mg/dL — ABNORMAL HIGH (ref 0.3–1.2)
Total Protein: 6.3 g/dL — ABNORMAL LOW (ref 6.5–8.1)

## 2015-09-30 LAB — CBC WITH DIFFERENTIAL/PLATELET
BASOS PCT: 1 %
Basophils Absolute: 0 10*3/uL (ref 0.0–0.1)
Eosinophils Absolute: 0.1 10*3/uL (ref 0.0–0.7)
Eosinophils Relative: 3 %
HEMATOCRIT: 26.5 % — AB (ref 39.0–52.0)
Hemoglobin: 9 g/dL — ABNORMAL LOW (ref 13.0–17.0)
LYMPHS PCT: 24 %
Lymphs Abs: 1 10*3/uL (ref 0.7–4.0)
MCH: 28.6 pg (ref 26.0–34.0)
MCHC: 34 g/dL (ref 30.0–36.0)
MCV: 84.1 fL (ref 78.0–100.0)
MONO ABS: 0.7 10*3/uL (ref 0.1–1.0)
MONOS PCT: 15 %
NEUTROS ABS: 2.4 10*3/uL (ref 1.7–7.7)
Neutrophils Relative %: 57 %
Platelets: 94 10*3/uL — ABNORMAL LOW (ref 150–400)
RBC: 3.15 MIL/uL — ABNORMAL LOW (ref 4.22–5.81)
RDW: 18.7 % — AB (ref 11.5–15.5)
WBC: 4.3 10*3/uL (ref 4.0–10.5)

## 2015-09-30 LAB — I-STAT CG4 LACTIC ACID, ED: Lactic Acid, Venous: 2.62 mmol/L (ref 0.5–2.0)

## 2015-09-30 LAB — I-STAT TROPONIN, ED: TROPONIN I, POC: 0.03 ng/mL (ref 0.00–0.08)

## 2015-09-30 LAB — TYPE AND SCREEN
ABO/RH(D): O POS
ANTIBODY SCREEN: NEGATIVE

## 2015-09-30 LAB — BRAIN NATRIURETIC PEPTIDE: B Natriuretic Peptide: 704.3 pg/mL — ABNORMAL HIGH (ref 0.0–100.0)

## 2015-09-30 MED ORDER — CALCIUM GLUCONATE 10 % IV SOLN
1.0000 g | Freq: Once | INTRAVENOUS | Status: AC
Start: 2015-09-30 — End: 2015-10-01
  Administered 2015-10-01: 1 g via INTRAVENOUS
  Filled 2015-09-30: qty 10

## 2015-09-30 MED ORDER — SODIUM CHLORIDE 0.9 % IV BOLUS (SEPSIS): 1000.0000 mL | Freq: Once | INTRAVENOUS | Status: DC

## 2015-09-30 MED ORDER — SODIUM CHLORIDE 0.9 % IV BOLUS (SEPSIS)
1000.0000 mL | Freq: Once | INTRAVENOUS | Status: AC
  Administered 2015-09-30: 1000 mL via INTRAVENOUS

## 2015-09-30 NOTE — H&P (Signed)
[facility] History and Physical  Walter French YIR:[id number] DOB: Nov 13, 1946 DOA: 09/30/2015  Referring physician: ED physician PCP: Karlene Einstein, MD  Specialists:   Chief Complaint: Generalized weakness, dizziness, nausea, vomiting, cough, burning on urination  HPI: Walter French is a 69 y.o. male with PMH of gastroesophageal cancer (completed radiation chemotherapy last week), diabetes mellitus, hyperlipidemia, hypothyroidism, GERD, gout, CAD, S/P CABG, (systolic and diastolic congestive heart failure), AICD, atrial fibrillation on Eliqis, OSA on CPAP, PAD, CKD-III, presents with generalized weakness, dizziness, nausea, vomiting, cough and burning on urination.  Patient reports that he completed radiation and chemotherapy last week for gastroesophageal cancer. He has been having nausea and vomiting in the past 3 weeks. He vomited 2 times today, no abdominal pain or diarrhea. He feels dizzy and weaker. He has poor balance. He reports that he has been having SOB and cough with white mucus production, but no chest pain, fever or chills. He also reports burning on urination, but no dysuria or increased urinary frequency. Vision does not have rashes, unilateral weakness, numbness or tingling sensations in his extremities, no vision change, hearing loss.  In ED, patient was found to have low blood pressure 86/54, tachycardia, tachypnea, normal temperature, lactate is 2.62, WBC 4.3, negative troponin, BNP 704.3, negative chest x-ray for acute abnormalities. Urinalysis pending. Patient is admitted to inpatient for further eval and treatment. PCCM is aware of this patient.  Where does patient live?   At home    Can patient participate in ADLs?  Barely   Review of Systems:   General: no fevers, chills, no changes in body weight, has poor appetite, has fatigue HEENT: no blurry vision, hearing changes or sore throat Pulm: has dyspnea, coughing, no wheezing CV: no chest pain,  palpitations Abd: has nausea, vomiting, no abdominal pain, diarrhea, constipation GU: no dysuria, has burning on urination, no increased urinary frequency, hematuria  Ext: no leg edema Neuro: no unilateral weakness, numbness, or tingling, no vision change or hearing loss Skin: no rash MSK: No muscle spasm, no deformity, no limitation of range of movement in spin Heme: No easy bruising.  Travel history: No recent long distant travel.  Allergy:  Allergies  Allergen Reactions  . Levaquin [Levofloxacin In D5w] Nausea And Vomiting  . Adhesive [Tape]     Pulls skin off  . Morphine And Related Nausea And Vomiting    Past Medical History  Diagnosis Date  . Hyperlipidemia   . Hypertension   . Coronary heart disease     a. s/p CABG x 3 (VG->PDA, VG->PL, LIMA->LAD);  b. 2013 Abnl Myoview;  c. 2013 Cath: 3/3 patent grafts, occluded LCX which correlated w/ ischemia on MV->not amenable to PCI->Med Rx.  . Obesity   . CHF (congestive heart failure) ([place])   . VT (ventricular tachycardia) ([place])   . SVT (supraventricular tachycardia) ([place])   . PAD (peripheral artery disease) ([place])     Angiography in February of 2014: Moderate left iliac artery stenosis, mild to moderate diffuse right SFA disease. Severe proximal right popliteal artery stenosis with three-vessel runoff below the knee. Status post self-expanding stent placement to the proximal popliteal artery  . Implantable cardioverter-defibrillator Mdt   . Asthma     "small touch" (07/29/2013)  . History of pneumonia     "3-4 times; last time ~ 2003" (07/29/2013)  . OSA on CPAP   . GERD (gastroesophageal reflux disease)   . H/O hiatal hernia   . Gout   . Anxiety   .  Allergy   . Stomach cancer ([place]) 07/23/2015    invasive adenocarcinoma ,esophagus ,distal   . Type II diabetes mellitus ([place])     on insulin  . Sleep apnea   . Hyperlipidemia   . PAD (peripheral artery disease) ([place])   . CHF (congestive heart failure) ([place])   . A-fib ([place])    . CKD (chronic kidney disease), stage III     Past Surgical History  Procedure Laterality Date  . Tonsillectomy    . Cardiac catheterization    . Wrist fracture surgery Right   . Cataract extraction w/ intraocular lens implant Left   . Cardiac defibrillator placement  1997; ~ 2003  . Coronary artery bypass graft  1997; 2007    "X3" (07/29/2013)  . Nerve, tendon and artery repair Left 09/20/2013    Procedure: IRRIGATION AND DEBRIDEMENT,Exploration and Repair of Ulnar/Digital  Artery Nerve of Left Index finger;  Surgeon: Roseanne Kaufman, MD;  Location: [place];  Service: Orthopedics;  Laterality: Left;  . Abdominal aortagram N/A 01/30/2013    Procedure: ABDOMINAL Maxcine Ham;  Surgeon: Wellington Hampshire, MD;  Location: [facility] CATH LAB;  Service: Cardiovascular;  Laterality: N/A;  . Cardiac catheterization N/A 07/22/2015    Procedure: Left Heart Cath and Coronary Angiography;  Surgeon: Peter M Martinique, MD;  Location: [place] [facility] CV LAB;  Service: Cardiovascular;  Laterality: N/A;  . Esophagogastroduodenoscopy N/A 07/23/2015    Procedure: ESOPHAGOGASTRODUODENOSCOPY (EGD);  Surgeon: Wonda Horner, MD;  Location: [facility] ENDOSCOPY;  Service: Endoscopy;  Laterality: N/A;    Social History:  reports that he quit smoking about 19 years ago. His smoking use included Cigarettes. He has a 60 pack-year smoking history. He has never used smokeless tobacco. He reports that he does not drink alcohol or use illicit drugs.  Family History:  Family History  Problem Relation Age of Onset  . Heart disease Brother   . Cancer Brother   . Breast cancer Mother      Prior to Admission medications   Medication Sig Start Date End Date Taking? Authorizing Provider  acetaminophen (TYLENOL) 500 MG tablet Take 1,000 mg by mouth every 6 (six) hours as needed for moderate pain or headache.   Yes Historical Provider, MD  allopurinol (ZYLOPRIM) 300 MG tablet Take 300 mg by mouth daily.     Yes Historical Provider, MD  apixaban  (ELIQUIS) 5 MG TABS tablet Take 1 tablet (5 mg total) by mouth 2 (two) times daily. 09/15/15  Yes Robbie Lis, MD  atorvastatin (LIPITOR) 40 MG tablet Take 1 tablet (40 mg total) by mouth daily. 03/11/15  Yes Deboraha Sprang, MD  digoxin (LANOXIN) 0.25 MG tablet Take 1 tablet (0.25 mg total) by mouth daily. 09/15/15  Yes Robbie Lis, MD  glipiZIDE (GLUCOTROL) 10 MG tablet Take 10 mg by mouth 2 (two) times daily before a meal.  09/18/12  Yes Historical Provider, MD  insulin aspart (NOVOLOG FLEXPEN) 100 UNIT/ML FlexPen  Use the following scale to administer insulin depending on CBG              CBG 121 - 150:  2  units    CBG 151 - 200: 3 units    CBG 201 - 250: 5 units    CBG 251 - 300: 8 units    CBG 301 - 350: 11 units    CBG 351 - 400: 15 units 09/15/15  Yes Robbie Lis, MD  levothyroxine (SYNTHROID, LEVOTHROID) 75 MCG tablet Take 75  mcg by mouth daily before breakfast.  12/06/14  Yes Historical Provider, MD  Liraglutide (VICTOZA) 18 MG/3ML SOPN Inject 1.8 mg into the skin daily.   Yes Historical Provider, MD  metoprolol succinate (TOPROL-XL) 100 MG 24 hr tablet Take 1 tablet by mouth daily. 09/02/15  Yes Historical Provider, MD  nitroGLYCERIN (NITROSTAT) 0.4 MG SL tablet Place 1 tablet (0.4 mg total) under the tongue every 5 (five) minutes x 3 doses as needed for chest pain. 10/03/14  Yes Peter M Martinique, MD  ondansetron (ZOFRAN) 8 MG tablet Take 1 tablet (8 mg total) by mouth every 8 (eight) hours as needed for nausea or vomiting. 09/21/15  Yes Rondel Jumbo, PA-C  prochlorperazine (COMPAZINE) 10 MG tablet Take 1 tablet (10 mg total) by mouth every 6 (six) hours as needed for nausea or vomiting. 09/03/15  Yes Owens Shark, NP  ranitidine (ZANTAC) 300 MG tablet Take 1 tablet (300 mg total) by mouth daily as needed for heartburn. 02/16/15  Yes Peter M Martinique, MD  sucralfate (CARAFATE) 1 G tablet Take 1 tablet (1 g total) by mouth 4 (four) times daily. 08/21/15  Yes Kyung Rudd, MD   traMADol (ULTRAM) 50 MG tablet Take 1 tablet (50 mg total) by mouth every 6 (six) hours as needed for moderate pain. 07/30/15  Yes Ladell Pier, MD  dexamethasone (DECADRON) 4 MG tablet Take 10 mg at 10pm..... night before chemotherapy Patient not taking: Reported on 09/16/2015 08/06/15   Ladell Pier, MD  hyaluronate sodium (RADIAPLEXRX) GEL Apply 1 application topically as needed (treatment).     Historical Provider, MD  HYDROcodone-acetaminophen (NORCO/VICODIN) 5-325 MG per tablet TAKE 1 TABLET BY MOUTH 4 TIMES DAILY AS NEEDED FOR PAIN Patient not taking: Reported on 09/30/2015 09/03/15   Owens Shark, NP  metoprolol (LOPRESSOR) 50 MG tablet Take 1 tablet (50 mg total) by mouth 3 (three) times daily. Patient not taking: Reported on 09/30/2015 09/15/15   Robbie Lis, MD    Physical Exam: Filed Vitals:   10/01/15 0030 10/01/15 0129 10/01/15 0200 10/01/15 0227  BP: 116/77 100/58  92/53  Pulse:  123 83 126  Temp:   97.6 F (36.4 C)   TempSrc:   Oral   Resp: 25 14 19 30   Height:   6' (1.829 m)   Weight:   99.5 kg (219 lb 5.7 oz)   SpO2:   94%    General: Not in acute distress HEENT:       Eyes: PERRL, EOMI, no scleral icterus.       ENT: No discharge from the ears and nose, no pharynx injection, no tonsillar enlargement.        Neck: No JVD, no bruit, no mass felt. Heme: No neck lymph node enlargement. Cardiac: S1/S2, irregularly irregular rhythm, No murmurs, No gallops or rubs. Pulm: Has coarse breathing sound, No rales, wheezing, rhonchi or rubs. Abd: Soft, nondistended, nontender, no rebound pain, no organomegaly, BS present. Ext: No pitting leg edema bilaterally. 2+DP/PT pulse bilaterally. Musculoskeletal: No joint deformities, No joint redness or warmth, no limitation of ROM in spin. Skin: No rashes.  Neuro: Alert, oriented X3, cranial nerves II-XII grossly intact, muscle strength 5/5 in all extremities, sensation to light touch intact. Brachial reflex 1`+ bilaterally.  Knee reflex 1+ bilaterally. Negative Babinski's sign. Normal finger to nose test. Psych: Patient is not psychotic, no suicidal or hemocidal ideation.  Labs on Admission:  Basic Metabolic Panel:  Recent Labs Lab 09/30/15 2151  NA  132*  K 4.0  CL 99*  CO2 26  GLUCOSE 125*  BUN 16  CREATININE 1.28*  CALCIUM 8.7*   Liver Function Tests:  Recent Labs Lab 09/30/15 2151  AST 35  ALT 9*  ALKPHOS 112  BILITOT 2.2*  PROT 6.3*  ALBUMIN 3.0*   No results for input(s): LIPASE, AMYLASE in the last 168 hours. No results for input(s): AMMONIA in the last 168 hours. CBC:  Recent Labs Lab 09/30/15 2151  WBC 4.3  NEUTROABS 2.4  HGB 9.0*  HCT 26.5*  MCV 84.1  PLT 94*   Cardiac Enzymes: No results for input(s): CKTOTAL, CKMB, CKMBINDEX, TROPONINI in the last 168 hours.  BNP (last 3 results)  Recent Labs  07/20/15 0512 09/10/15 1844 09/30/15 2156  BNP 217.8* 254.6* 704.3*    ProBNP (last 3 results) No results for input(s): PROBNP in the last 8760 hours.  CBG: No results for input(s): GLUCAP in the last 168 hours.  Radiological Exams on Admission: Dg Chest 2 View  09/30/2015  CLINICAL DATA:  Shortness of breath and cough for 3 days, generalized weakness EXAM: CHEST - 2 VIEW COMPARISON:  9/29/616 FINDINGS: Cardiac shadow is enlarged. A defibrillator is again seen. Postsurgical changes are noted. Lingular atelectatic changes are again seen. No sizable effusion is noted. No acute bony abnormality is seen. IMPRESSION: Stable changes in the lingula.  No acute abnormality noted. Electronically Signed   By: Inez Catalina M.D.   On: 09/30/2015 22:12    EKG: Independently reviewed.  Abnormal findings:  QTC 510, atrial fibrillation, occasional PVC   Assessment/Plan Principal Problem:   Sepsis ([place]) Active Problems:   Hyperlipidemia   Coronary artery disease-status post CABG   Systolic and diastolic CHF, chronic (HCC)   Gastroesophageal cancer ([place])   Acute renal failure  superimposed on stage 3 chronic kidney disease (HCC)   Pancytopenia due to antineoplastic chemotherapy ([place])   Type 2 diabetes mellitus with renal manifestations (HCC)   Hypothyroidism   Gout   Weakness generalized   Dehydration   Cough   Nausea with vomiting   Sepsis ([place]): Patient is septic with elevated lactate, tachypnea, tachycardia, hypotension on admission. Source of infection is not clear. He has burning on urination, indicating possible urinary tract infection, but urinalysis is pending due to difficulty urinating. Patient has cough with negative chest x-ray. He has coarse breathing sound, indicating possible bronchitis.  -will admit to SDU -will get Procalcitonin and trend lactic acid levels per sepsis protocol. -IVF: 1.5L of NS bolus in ED, followed by 100 cc/h (patient has a congestive heart failure with EF 30 to 35% and elevated BNP 704, limiting aggressive IV fluids treatment). -start IV Levaquine to cover possible UTI and bronchitis -Follow up sputum culture, blood and urine culture -PCCM aware of this pt  Gastroesophageal cancer [facility]): Patient completed surgery cycle for radiation therapy and 5 seconds of chemotherapy last week. He has been followed up by Dr. Clarice Pole.  -Follow up with Dr. Clarice Pole  Nausea and vomiting: Etiology is not clear, most likely due to recent chemotherapy. Other differential diagnosis includes pancreatitis and viral gastritis. Patient does not have abdominal pain or diarrhea. -Check lipase -Hydroxyzine when necessary for nausea (patient has prolonged QTc interval 510, not a good candidate for using Zofran)  Coronary artery disease-status post CABG:  No CP. -Continue metoprolol and when necessary nitroglycerin  HLD: Last LDL was 49 on 8 /19/16 -Continue home medications: Lipitor  Systolic and diastolic CHF, chronic ([place]): 2-D echo on  07/23/15 showed EF 30-35% of his great to diastolic dysfunction. His BNP is 704.3, but the patient does not have  leg edema on admission. The elevated BNP may be related to the worsening renal function. Patient is not taking diuretics at home. -Watch volume status closely while patient is receiving IV fluid -Continue metoprolol  AoCKD-III: Baseline Cre is 0.9 on 09/21/15, his Cre is 1.28, BUN 16 on admission. Likely due to prerenal secondary to dehydration - IVF as above - Check FeNa - US-renal - Follow up renal function by BMP  Pancytopenia due to antineoplastic chemotherapy [facility]): Hemoglobin 9.0 on admission, platelet 94, no bleeding tendency -f/u by cbc  DM-II: Last A1c 6.8 on 07/20/15, well controled. Patient is taking Victoza, glipizide and NovoLog at home -SSI  Hypothyroidism: Last TSH was 0.461 on 09/10/15 -Continue home Synthroid  Gout: stable. -Continue allopurinol  Atrial Fibrillation with RVR: CHA2DS2-VASc Score is 5, needs oral anticoagulation. Patient is on Eliquis at home. INR is  on admission. Heart rate is 124/min on admission. -continue Eliquis and metoprolol -IV fluid as above, if heart rate is not controlled, may start Cardene drip, but need to pay attention about blood pressure   DVT ppx: on Eliquis Code Status: Full code Family Communication: Yes, patient's  Daughter and POA, Ms. Holt at bed side Disposition Plan: Admit to inpatient   Date of Service 10/01/2015    Walter French [facility] Pager [phone]  If 7PM-7AM, please contact night-coverage [email or web address] Password [email or web address] 10/01/2015, 3:02 AM

## 2015-09-30 NOTE — ED Provider Notes (Addendum)
CSN: [id number]     Arrival date & time 09/30/15  2057 History   First MD Initiated Contact with Patient 09/30/15 2125     Chief Complaint  Patient presents with  . Tachycardia  . Weakness   HPI Patient presents to the emergency room with complaints of generalized weakness and lightheadedness. Patient has a complex medical history including gastric cancer that has been treated with radiation and chemotherapy. Patient states that he has completed all of his chemotherapy treatments now. Today he felt very weak. Whenever he tried to walk around he felt as if he was going to pass out. Family members feel that he looks pale and somewhat yellow. He has had nausea and vomiting but denies any abdominal pain. He has not been able to eat well for weeks because of his cancer. Denies any diarrhea.No fevers or chills. He has had a cough.   Past Medical History  Diagnosis Date  . Hyperlipidemia   . Hypertension   . Coronary heart disease     a. s/p CABG x 3 (VG->PDA, VG->PL, LIMA->LAD);  b. 2013 Abnl Myoview;  c. 2013 Cath: 3/3 patent grafts, occluded LCX which correlated w/ ischemia on MV->not amenable to PCI->Med Rx.  . Obesity   . CHF (congestive heart failure) ([place])   . VT (ventricular tachycardia) ([place])   . SVT (supraventricular tachycardia) ([place])   . PAD (peripheral artery disease) ([place])     Angiography in February of 2014: Moderate left iliac artery stenosis, mild to moderate diffuse right SFA disease. Severe proximal right popliteal artery stenosis with three-vessel runoff below the knee. Status post self-expanding stent placement to the proximal popliteal artery  . Implantable cardioverter-defibrillator Mdt   . Asthma     "small touch" (07/29/2013)  . History of pneumonia     "3-4 times; last time ~ 2003" (07/29/2013)  . OSA on CPAP   . GERD (gastroesophageal reflux disease)   . H/O hiatal hernia   . Gout   . Anxiety   . Allergy   . 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 Type II diabetes mellitus ([place])     on insulin  . Sleep apnea   . Hyperlipidemia   . PAD (peripheral artery disease) ([place])   . CHF (congestive heart failure) [facility])    Past Surgical History  Procedure Laterality Date  . Tonsillectomy    . Cardiac catheterization    . Wrist fracture surgery Right   . Cataract extraction w/ intraocular lens implant Left   . Cardiac defibrillator placement  1997; ~ 2003  . Coronary artery bypass graft  1997; 2007    "X3" (07/29/2013)  . Nerve, tendon and artery repair Left 09/20/2013    Procedure: IRRIGATION AND DEBRIDEMENT,Exploration and Repair of Ulnar/Digital  Artery Nerve of Left Index finger;  Surgeon: Roseanne Kaufman, MD;  Location: [place];  Service: Orthopedics;  Laterality: Left;  . Abdominal aortagram N/A 01/30/2013    Procedure: ABDOMINAL Maxcine Ham;  Surgeon: Wellington Hampshire, MD;  Location: [facility] CATH LAB;  Service: Cardiovascular;  Laterality: N/A;  . Cardiac catheterization N/A 07/22/2015    Procedure: Left Heart Cath and Coronary Angiography;  Surgeon: Peter M Martinique, MD;  Location: [place] CV LAB;  Service: Cardiovascular;  Laterality: N/A;  . Esophagogastroduodenoscopy N/A 07/23/2015    Procedure: ESOPHAGOGASTRODUODENOSCOPY (EGD);  Surgeon: Wonda Horner, MD;  Location: [facility] ENDOSCOPY;  Service: Endoscopy;  Laterality: N/A;   Family History  Problem Relation  Age of Onset  . Heart disease Brother   . Cancer Brother   . Breast cancer Mother    Social History  Substance Use Topics  . Smoking status: Former Smoker -- 2.00 packs/day for 30 years    Types: Cigarettes    Quit date: 12/13/1995  . Smokeless tobacco: Never Used  . Alcohol Use: No     Comment: 07/29/2013 "not touched any alcohol since 1997"    Review of Systems  All other systems reviewed and are negative.     Allergies  Levaquin; Adhesive; and Morphine and related  Home Medications   Prior to Admission medications   Medication Sig Start Date End  Date Taking? Authorizing Provider  acetaminophen (TYLENOL) 500 MG tablet Take 1,000 mg by mouth every 6 (six) hours as needed for moderate pain or headache.   Yes Historical Provider, MD  allopurinol (ZYLOPRIM) 300 MG tablet Take 300 mg by mouth daily.     Yes Historical Provider, MD  apixaban (ELIQUIS) 5 MG TABS tablet Take 1 tablet (5 mg total) by mouth 2 (two) times daily. 09/15/15  Yes Robbie Lis, MD  atorvastatin (LIPITOR) 40 MG tablet Take 1 tablet (40 mg total) by mouth daily. 03/11/15  Yes Deboraha Sprang, MD  digoxin (LANOXIN) 0.25 MG tablet Take 1 tablet (0.25 mg total) by mouth daily. 09/15/15  Yes Robbie Lis, MD  glipiZIDE (GLUCOTROL) 10 MG tablet Take 10 mg by mouth 2 (two) times daily before a meal.  09/18/12  Yes Historical Provider, MD  insulin aspart (NOVOLOG FLEXPEN) 100 UNIT/ML FlexPen  Use the following scale to administer insulin depending on CBG              CBG 121 - 150:  2  units    CBG 151 - 200: 3 units    CBG 201 - 250: 5 units    CBG 251 - 300: 8 units    CBG 301 - 350: 11 units    CBG 351 - 400: 15 units 09/15/15  Yes Robbie Lis, MD  levothyroxine (SYNTHROID, LEVOTHROID) 75 MCG tablet Take 75 mcg by mouth daily before breakfast.  12/06/14  Yes Historical Provider, MD  Liraglutide (VICTOZA) 18 MG/3ML SOPN Inject 1.8 mg into the skin daily.   Yes Historical Provider, MD  metoprolol succinate (TOPROL-XL) 100 MG 24 hr tablet Take 1 tablet by mouth daily. 09/02/15  Yes Historical Provider, MD  nitroGLYCERIN (NITROSTAT) 0.4 MG SL tablet Place 1 tablet (0.4 mg total) under the tongue every 5 (five) minutes x 3 doses as needed for chest pain. 10/03/14  Yes Peter M Martinique, MD  ondansetron (ZOFRAN) 8 MG tablet Take 1 tablet (8 mg total) by mouth every 8 (eight) hours as needed for nausea or vomiting. 09/21/15  Yes Rondel Jumbo, PA-C  prochlorperazine (COMPAZINE) 10 MG tablet Take 1 tablet (10 mg total) by mouth every 6 (six) hours as needed for nausea or  vomiting. 09/03/15  Yes Owens Shark, NP  ranitidine (ZANTAC) 300 MG tablet Take 1 tablet (300 mg total) by mouth daily as needed for heartburn. 02/16/15  Yes Peter M Martinique, MD  sucralfate (CARAFATE) 1 G tablet Take 1 tablet (1 g total) by mouth 4 (four) times daily. 08/21/15  Yes Kyung Rudd, MD  traMADol (ULTRAM) 50 MG tablet Take 1 tablet (50 mg total) by mouth every 6 (six) hours as needed for moderate pain. 07/30/15  Yes Ladell Pier, MD  dexamethasone (DECADRON) 4  MG tablet Take 10 mg at 10pm..... night before chemotherapy Patient not taking: Reported on 09/16/2015 08/06/15   Ladell Pier, MD  hyaluronate sodium (RADIAPLEXRX) GEL Apply 1 application topically as needed (treatment).     Historical Provider, MD  HYDROcodone-acetaminophen (NORCO/VICODIN) 5-325 MG per tablet TAKE 1 TABLET BY MOUTH 4 TIMES DAILY AS NEEDED FOR PAIN Patient not taking: Reported on 09/30/2015 09/03/15   Owens Shark, NP  metoprolol (LOPRESSOR) 50 MG tablet Take 1 tablet (50 mg total) by mouth 3 (three) times daily. Patient not taking: Reported on 09/30/2015 09/15/15   Robbie Lis, MD   BP 86/54 mmHg  Pulse 88  Temp(Src) 98 F (36.7 C) (Temporal)  Resp 33  SpO2 97% Physical Exam  Constitutional: No distress.  HENT:  Head: Normocephalic and atraumatic.  Right Ear: External ear normal.  Left Ear: External ear normal.  Mouth/Throat: No oropharyngeal exudate.  Eyes: Conjunctivae are normal. Right eye exhibits no discharge. Left eye exhibits no discharge. No scleral icterus.  Neck: Neck supple. No tracheal deviation present.  Cardiovascular: Intact distal pulses.  An irregularly irregular rhythm present. Tachycardia present.   Pulmonary/Chest: Effort normal. No stridor. No respiratory distress. He has decreased breath sounds. He has no wheezes. He has rales (few crackles at the bases).  Abdominal: Soft. Bowel sounds are normal. He exhibits no distension. There is no tenderness. There is no rebound and no  guarding.  Musculoskeletal: He exhibits edema. He exhibits no tenderness.  Neurological: He is alert. He has normal strength. No cranial nerve deficit (no facial droop, extraocular movements intact, no slurred speech) or sensory deficit. He exhibits normal muscle tone. He displays no seizure activity. Coordination normal.  Patient able to lift both arms off the bed without pronator drift, able to lift both legs off the bed  Skin: Skin is warm and dry. No rash noted. He is not diaphoretic. There is pallor.  Psychiatric: His behavior is normal. Thought content normal.  Nursing note and vitals reviewed.   ED Course  Procedures   CRITICAL CARE Performed by: YSAYT,KZS Total critical care time: 35 Critical care time was exclusive of separately billable procedures and treating other patients. Critical care was necessary to treat or prevent imminent or life-threatening deterioration. Critical care was time spent personally by me on the following activities: development of treatment plan with patient and/or surrogate as well as nursing, discussions with consultants, evaluation of patient's response to treatment, examination of patient, obtaining history from patient or surrogate, ordering and performing treatments and interventions, ordering and review of laboratory studies, ordering and review of radiographic studies, pulse oximetry and re-evaluation of patient's condition.  Labs Review Labs Reviewed  COMPREHENSIVE METABOLIC PANEL - Abnormal; Notable for the following:    Sodium 132 (*)    Chloride 99 (*)    Glucose, Bld 125 (*)    Creatinine, Ser 1.28 (*)    Calcium 8.7 (*)    Total Protein 6.3 (*)    Albumin 3.0 (*)    ALT 9 (*)    Total Bilirubin 2.2 (*)    GFR calc non Af Amer 57 (*)    All other components within normal limits  CBC WITH DIFFERENTIAL/PLATELET - Abnormal; Notable for the following:    RBC 3.15 (*)    Hemoglobin 9.0 (*)    HCT 26.5 (*)    RDW 18.7 (*)    Platelets 94  (*)    All other components within normal limits  BRAIN NATRIURETIC PEPTIDE -  Abnormal; Notable for the following:    B Natriuretic Peptide 704.3 (*)    All other components within normal limits  BLOOD GAS, ARTERIAL - Abnormal; Notable for the following:    pH, Arterial 7.492 (*)    pCO2 arterial 31.8 (*)    pO2, Arterial 109 (*)    Bicarbonate 24.3 (*)    All other components within normal limits  I-STAT CG4 LACTIC ACID, ED - Abnormal; Notable for the following:    Lactic Acid, Venous 2.62 (*)    All other components within normal limits  CULTURE, BLOOD (ROUTINE X 2)  CULTURE, BLOOD (ROUTINE X 2)  URINE CULTURE  URINALYSIS, ROUTINE W REFLEX MICROSCOPIC (NOT AT [facility])  I-STAT TROPOININ, ED  TYPE AND SCREEN  ABO/RH    Imaging Review Dg Chest 2 View  09/30/2015  CLINICAL DATA:  Shortness of breath and cough for 3 days, generalized weakness EXAM: CHEST - 2 VIEW COMPARISON:  9/29/616 FINDINGS: Cardiac shadow is enlarged. A defibrillator is again seen. Postsurgical changes are noted. Lingular atelectatic changes are again seen. No sizable effusion is noted. No acute bony abnormality is seen. IMPRESSION: Stable changes in the lingula.  No acute abnormality noted. Electronically Signed   By: Inez Catalina M.D.   On: 09/30/2015 22:12   I have personally reviewed and evaluated these images and lab results as part of my medical decision-making.   EKG Interpretation   Date/Time:  Wednesday September 30 2015 21:27:12 EDT Ventricular Rate:  131 PR Interval:    QRS Duration: 101 QT Interval:  342 QTC Calculation: 505 R Axis:   54 Text Interpretation:  Atrial fibrillation Anterior infarct, old Prolonged  QT interval No significant change since last tracing Confirmed by Morrie Daywalt   MD-J, Rionna Feltes ([id number]) on 09/30/2015 9:35:58 PM      MDM   Final diagnoses:  Anemia, unspecified anemia type  Persistent atrial fibrillation (HCC)  Dehydration   Pt has chronic a fib.  Rate has been variable in the  ED.  From 70s up to 120s.  Could be contributing to his weakness.  Pt has an elevated lactic acid level but I think this is more related to dehydration.  He does not have a fever or WBC.  No infection identified at this point and he has not been eating or drinking well because of his cancer.  Anemia is slightly worse.  He denies any acute bleeding.  Most likely related to his cancer and chemotherapy.  Will need to monitor his stools.  Will continue IV fluids.  Will give dose of calcium in case we need to start rate controlling agents.  Consult with hospitalist for admission.      Dorie Rank, MD 09/30/15 [id number]  Dorie Rank, MD 09/30/15 [phone]

## 2015-09-30 NOTE — ED Notes (Signed)
Pt presents yellow in color, weak, not responding to a lot of questions, O2 sat not reading, . Daughter states he called her at 19:30 tonight saying "i'm hurting so bad I feel like I'm dying, I can't stop being sick." Although in triage pt states he's not in pain, but that he feels very dizzy and lost his balance earlier on today. Daughter states over the last 3 weeks he's been vomiting more frequently.

## 2015-09-30 NOTE — ED Notes (Signed)
Notified Dr.Knapp of lactic acid of 2.62 @ 2200 by QA

## 2015-09-30 NOTE — Progress Notes (Signed)
  Radiation Oncology         [phone] ________________________________  Name: Walter French MRN: [id number]  Date: 09/24/2015  DOB: September 24, 1946  End of Treatment Note  Diagnosis:   Cancer of the gastroesophageal junction     Indication for treatment::  curative       Radiation treatment dates:   08/12/2015 through 09/24/2015  Site/dose:   The patient was initially treated to the gross volume in the upper abdomen and the high-risk surrounding region to a dose of 45 gray using a 3-D conformal technique that utilized 10 radiation treatment fields on our tomotherapy unit. The patient then received a 9 gray boost using a 6 field technique. [name] final dose was 54 gray which was given with concurrent chemotherapy.  Narrative: The patient tolerated radiation treatment relatively well.   The patient had some irritative symptoms which were treated during the course of his course of therapy.  Plan: The patient has completed radiation treatment. The patient will return to radiation oncology clinic for routine followup in one month. I advised the patient to call or return sooner if they have any questions or concerns related to their recovery or treatment. ________________________________  Jodelle Gross, M.D., Ph.D.

## 2015-09-30 NOTE — Progress Notes (Signed)
  Radiation Oncology         [phone]) [phone] ________________________________  Name: Walter French MRN: [id number]  Date: 08/31/2015  DOB: Apr 16, 1946  COMPLEX SIMULATION  NOTE  Diagnosis: Gastroesophageal cancer  Narrative The patient has initially been planned to receive a course of radiation treatment to a dose of 45 gray in 25 fractions at 1.8 gray per fraction. The patient will now receive a boost to the high risk target volume for an additional 5.4 gray. This will be delivered in 5 fractions at 1.8 gray per fraction and a cone down boost technique will be utilized. To accomplish this, an additional 6 customized blocks have been designed for this purpose Which will correspond to a 3-D conformal technique on our tomotherapy unit. A complex isodose plan is requested to ensure that the high-risk target region receives the appropriate radiation dose and that the nearby normal structures continue to be appropriately spared. [name] final total dose therefore will be 54 gray.   ________________________________ ------------------------------------------------  Jodelle Gross, MD, PhD

## 2015-10-01 ENCOUNTER — Encounter (HOSPITAL_COMMUNITY): Payer: Self-pay | Admitting: Internal Medicine

## 2015-10-01 DIAGNOSIS — E860 Dehydration: Secondary | ICD-10-CM | POA: Diagnosis present

## 2015-10-01 DIAGNOSIS — J45909 Unspecified asthma, uncomplicated: Secondary | ICD-10-CM | POA: Diagnosis present

## 2015-10-01 DIAGNOSIS — D61810 Antineoplastic chemotherapy induced pancytopenia: Secondary | ICD-10-CM | POA: Diagnosis present

## 2015-10-01 DIAGNOSIS — R531 Weakness: Secondary | ICD-10-CM

## 2015-10-01 DIAGNOSIS — J9811 Atelectasis: Secondary | ICD-10-CM | POA: Diagnosis present

## 2015-10-01 DIAGNOSIS — Z951 Presence of aortocoronary bypass graft: Secondary | ICD-10-CM | POA: Diagnosis not present

## 2015-10-01 DIAGNOSIS — Z6829 Body mass index (BMI) 29.0-29.9, adult: Secondary | ICD-10-CM | POA: Diagnosis not present

## 2015-10-01 DIAGNOSIS — E1151 Type 2 diabetes mellitus with diabetic peripheral angiopathy without gangrene: Secondary | ICD-10-CM | POA: Diagnosis present

## 2015-10-01 DIAGNOSIS — Z809 Family history of malignant neoplasm, unspecified: Secondary | ICD-10-CM | POA: Diagnosis not present

## 2015-10-01 DIAGNOSIS — R112 Nausea with vomiting, unspecified: Secondary | ICD-10-CM | POA: Diagnosis present

## 2015-10-01 DIAGNOSIS — Z95810 Presence of automatic (implantable) cardiac defibrillator: Secondary | ICD-10-CM | POA: Diagnosis not present

## 2015-10-01 DIAGNOSIS — I5042 Chronic combined systolic (congestive) and diastolic (congestive) heart failure: Secondary | ICD-10-CM | POA: Diagnosis present

## 2015-10-01 DIAGNOSIS — E1122 Type 2 diabetes mellitus with diabetic chronic kidney disease: Secondary | ICD-10-CM | POA: Diagnosis present

## 2015-10-01 DIAGNOSIS — J209 Acute bronchitis, unspecified: Secondary | ICD-10-CM | POA: Diagnosis present

## 2015-10-01 DIAGNOSIS — M10012 Idiopathic gout, left shoulder: Secondary | ICD-10-CM

## 2015-10-01 DIAGNOSIS — Z8701 Personal history of pneumonia (recurrent): Secondary | ICD-10-CM | POA: Diagnosis not present

## 2015-10-01 DIAGNOSIS — N390 Urinary tract infection, site not specified: Secondary | ICD-10-CM | POA: Diagnosis present

## 2015-10-01 DIAGNOSIS — E1121 Type 2 diabetes mellitus with diabetic nephropathy: Secondary | ICD-10-CM

## 2015-10-01 DIAGNOSIS — R05 Cough: Secondary | ICD-10-CM | POA: Diagnosis present

## 2015-10-01 DIAGNOSIS — Z85028 Personal history of other malignant neoplasm of stomach: Secondary | ICD-10-CM | POA: Diagnosis not present

## 2015-10-01 DIAGNOSIS — Z794 Long term (current) use of insulin: Secondary | ICD-10-CM | POA: Diagnosis not present

## 2015-10-01 DIAGNOSIS — T451X5A Adverse effect of antineoplastic and immunosuppressive drugs, initial encounter: Secondary | ICD-10-CM | POA: Diagnosis present

## 2015-10-01 DIAGNOSIS — A419 Sepsis, unspecified organism: Secondary | ICD-10-CM | POA: Diagnosis present

## 2015-10-01 DIAGNOSIS — Z923 Personal history of irradiation: Secondary | ICD-10-CM | POA: Diagnosis not present

## 2015-10-01 DIAGNOSIS — R7989 Other specified abnormal findings of blood chemistry: Secondary | ICD-10-CM | POA: Diagnosis present

## 2015-10-01 DIAGNOSIS — I959 Hypotension, unspecified: Secondary | ICD-10-CM | POA: Diagnosis present

## 2015-10-01 DIAGNOSIS — Z79899 Other long term (current) drug therapy: Secondary | ICD-10-CM | POA: Diagnosis not present

## 2015-10-01 DIAGNOSIS — E669 Obesity, unspecified: Secondary | ICD-10-CM | POA: Diagnosis present

## 2015-10-01 DIAGNOSIS — I482 Chronic atrial fibrillation: Secondary | ICD-10-CM | POA: Diagnosis present

## 2015-10-01 DIAGNOSIS — Z803 Family history of malignant neoplasm of breast: Secondary | ICD-10-CM | POA: Diagnosis not present

## 2015-10-01 DIAGNOSIS — I2510 Atherosclerotic heart disease of native coronary artery without angina pectoris: Secondary | ICD-10-CM

## 2015-10-01 DIAGNOSIS — I4581 Long QT syndrome: Secondary | ICD-10-CM | POA: Diagnosis present

## 2015-10-01 DIAGNOSIS — I130 Hypertensive heart and chronic kidney disease with heart failure and stage 1 through stage 4 chronic kidney disease, or unspecified chronic kidney disease: Secondary | ICD-10-CM | POA: Diagnosis present

## 2015-10-01 DIAGNOSIS — R059 Cough, unspecified: Secondary | ICD-10-CM | POA: Diagnosis present

## 2015-10-01 DIAGNOSIS — M109 Gout, unspecified: Secondary | ICD-10-CM | POA: Diagnosis present

## 2015-10-01 DIAGNOSIS — D649 Anemia, unspecified: Secondary | ICD-10-CM | POA: Diagnosis present

## 2015-10-01 DIAGNOSIS — E43 Unspecified severe protein-calorie malnutrition: Secondary | ICD-10-CM | POA: Diagnosis present

## 2015-10-01 DIAGNOSIS — N179 Acute kidney failure, unspecified: Secondary | ICD-10-CM

## 2015-10-01 DIAGNOSIS — C160 Malignant neoplasm of cardia: Secondary | ICD-10-CM | POA: Diagnosis present

## 2015-10-01 DIAGNOSIS — E785 Hyperlipidemia, unspecified: Secondary | ICD-10-CM | POA: Diagnosis present

## 2015-10-01 DIAGNOSIS — N183 Chronic kidney disease, stage 3 (moderate): Secondary | ICD-10-CM | POA: Diagnosis present

## 2015-10-01 DIAGNOSIS — K219 Gastro-esophageal reflux disease without esophagitis: Secondary | ICD-10-CM | POA: Diagnosis present

## 2015-10-01 DIAGNOSIS — E039 Hypothyroidism, unspecified: Secondary | ICD-10-CM | POA: Diagnosis present

## 2015-10-01 DIAGNOSIS — Z7901 Long term (current) use of anticoagulants: Secondary | ICD-10-CM | POA: Diagnosis not present

## 2015-10-01 DIAGNOSIS — Z8249 Family history of ischemic heart disease and other diseases of the circulatory system: Secondary | ICD-10-CM | POA: Diagnosis not present

## 2015-10-01 DIAGNOSIS — Z79891 Long term (current) use of opiate analgesic: Secondary | ICD-10-CM | POA: Diagnosis not present

## 2015-10-01 DIAGNOSIS — I4819 Other persistent atrial fibrillation: Secondary | ICD-10-CM | POA: Insufficient documentation

## 2015-10-01 DIAGNOSIS — G4733 Obstructive sleep apnea (adult) (pediatric): Secondary | ICD-10-CM | POA: Diagnosis present

## 2015-10-01 DIAGNOSIS — F419 Anxiety disorder, unspecified: Secondary | ICD-10-CM | POA: Diagnosis present

## 2015-10-01 DIAGNOSIS — Z87891 Personal history of nicotine dependence: Secondary | ICD-10-CM | POA: Diagnosis not present

## 2015-10-01 DIAGNOSIS — I4891 Unspecified atrial fibrillation: Secondary | ICD-10-CM | POA: Diagnosis present

## 2015-10-01 LAB — BILIRUBIN, DIRECT: BILIRUBIN DIRECT: 0.4 mg/dL (ref 0.1–0.5)

## 2015-10-01 LAB — CBC
HEMATOCRIT: 25.4 % — AB (ref 39.0–52.0)
HEMOGLOBIN: 8.6 g/dL — AB (ref 13.0–17.0)
MCH: 28.3 pg (ref 26.0–34.0)
MCHC: 33.9 g/dL (ref 30.0–36.0)
MCV: 83.6 fL (ref 78.0–100.0)
Platelets: 61 10*3/uL — ABNORMAL LOW (ref 150–400)
RBC: 3.04 MIL/uL — AB (ref 4.22–5.81)
RDW: 18.6 % — ABNORMAL HIGH (ref 11.5–15.5)
WBC: 3.5 10*3/uL — AB (ref 4.0–10.5)

## 2015-10-01 LAB — URINE MICROSCOPIC-ADD ON

## 2015-10-01 LAB — COMPREHENSIVE METABOLIC PANEL
ALT: 9 U/L — ABNORMAL LOW (ref 17–63)
AST: 28 U/L (ref 15–41)
Albumin: 2.9 g/dL — ABNORMAL LOW (ref 3.5–5.0)
Alkaline Phosphatase: 99 U/L (ref 38–126)
Anion gap: 5 (ref 5–15)
BUN: 15 mg/dL (ref 6–20)
CHLORIDE: 102 mmol/L (ref 101–111)
CO2: 26 mmol/L (ref 22–32)
Calcium: 8.2 mg/dL — ABNORMAL LOW (ref 8.9–10.3)
Creatinine, Ser: 1 mg/dL (ref 0.61–1.24)
Glucose, Bld: 109 mg/dL — ABNORMAL HIGH (ref 65–99)
POTASSIUM: 3.8 mmol/L (ref 3.5–5.1)
SODIUM: 133 mmol/L — AB (ref 135–145)
Total Bilirubin: 2.3 mg/dL — ABNORMAL HIGH (ref 0.3–1.2)
Total Protein: 5.9 g/dL — ABNORMAL LOW (ref 6.5–8.1)

## 2015-10-01 LAB — DIGOXIN LEVEL: Digoxin Level: <0.2 ng/mL — ABNORMAL LOW (ref 0.8–2.0)

## 2015-10-01 LAB — CORTISOL: CORTISOL PLASMA: 22 ug/dL

## 2015-10-01 LAB — CREATININE, URINE, RANDOM: Creatinine, Urine: 415.8 mg/dL

## 2015-10-01 LAB — URINALYSIS, ROUTINE W REFLEX MICROSCOPIC
Glucose, UA: NEGATIVE mg/dL
Hgb urine dipstick: NEGATIVE
Ketones, ur: NEGATIVE mg/dL
Nitrite: POSITIVE — AB
PROTEIN: 100 mg/dL — AB
SPECIFIC GRAVITY, URINE: 1.03 (ref 1.005–1.030)
UROBILINOGEN UA: 1 mg/dL (ref 0.0–1.0)
pH: 5.5 (ref 5.0–8.0)

## 2015-10-01 LAB — SODIUM, URINE, RANDOM: Sodium, Ur: 28 mmol/L

## 2015-10-01 LAB — I-STAT CG4 LACTIC ACID, ED: LACTIC ACID, VENOUS: 1.9 mmol/L (ref 0.5–2.0)

## 2015-10-01 LAB — LACTIC ACID, PLASMA
LACTIC ACID, VENOUS: 1.2 mmol/L (ref 0.5–2.0)
LACTIC ACID, VENOUS: 1.7 mmol/L (ref 0.5–2.0)

## 2015-10-01 LAB — APTT: APTT: 38 s — AB (ref 24–37)

## 2015-10-01 LAB — GLUCOSE, CAPILLARY
GLUCOSE-CAPILLARY: 99 mg/dL (ref 65–99)
GLUCOSE-CAPILLARY: 127 mg/dL — AB (ref 65–99)
GLUCOSE-CAPILLARY: 179 mg/dL — AB (ref 65–99)
Glucose-Capillary: 107 mg/dL — ABNORMAL HIGH (ref 65–99)
Glucose-Capillary: 112 mg/dL — ABNORMAL HIGH (ref 65–99)
Glucose-Capillary: 213 mg/dL — ABNORMAL HIGH (ref 65–99)

## 2015-10-01 LAB — PROCALCITONIN: Procalcitonin: 0.34 ng/mL

## 2015-10-01 LAB — PROTIME-INR
INR: 1.54 — AB (ref 0.00–1.49)
Prothrombin Time: 18.6 seconds — ABNORMAL HIGH (ref 11.6–15.2)

## 2015-10-01 LAB — LIPASE, BLOOD: Lipase: 35 U/L (ref 11–51)

## 2015-10-01 MED ORDER — APIXABAN 5 MG PO TABS
5.0000 mg | ORAL_TABLET | Freq: Two times a day (BID) | ORAL | Status: DC
  Administered 2015-10-01 – 2015-10-02 (×4): 5 mg via ORAL
  Filled 2015-10-01 (×4): qty 1

## 2015-10-01 MED ORDER — DIGOXIN 125 MCG PO TABS
0.2500 mg | ORAL_TABLET | Freq: Every day | ORAL | Status: DC
  Administered 2015-10-01 – 2015-10-02 (×2): 0.25 mg via ORAL
  Filled 2015-10-01 (×2): qty 2

## 2015-10-01 MED ORDER — INSULIN ASPART 100 UNIT/ML ~~LOC~~ SOLN
0.0000 [IU] | Freq: Three times a day (TID) | SUBCUTANEOUS | Status: DC
  Administered 2015-10-01: 3 [IU] via SUBCUTANEOUS
  Administered 2015-10-01 – 2015-10-02 (×2): 1 [IU] via SUBCUTANEOUS

## 2015-10-01 MED ORDER — ENSURE ENLIVE PO LIQD
237.0000 mL | Freq: Two times a day (BID) | ORAL | Status: DC
  Administered 2015-10-01 – 2015-10-02 (×2): 237 mL via ORAL

## 2015-10-01 MED ORDER — SODIUM CHLORIDE 0.9 % IJ SOLN
3.0000 mL | Freq: Two times a day (BID) | INTRAMUSCULAR | Status: DC
  Administered 2015-10-01 – 2015-10-02 (×3): 3 mL via INTRAVENOUS

## 2015-10-01 MED ORDER — TRAMADOL HCL 50 MG PO TABS: 50.0000 mg | ORAL_TABLET | Freq: Four times a day (QID) | ORAL | Status: DC | PRN

## 2015-10-01 MED ORDER — SUCRALFATE 1 G PO TABS
1.0000 g | ORAL_TABLET | Freq: Four times a day (QID) | ORAL | Status: DC
  Administered 2015-10-01 – 2015-10-02 (×5): 1 g via ORAL
  Filled 2015-10-01 (×5): qty 1

## 2015-10-01 MED ORDER — HYDROXYZINE HCL 50 MG/ML IM SOLN
25.0000 mg | Freq: Four times a day (QID) | INTRAMUSCULAR | Status: DC | PRN
  Filled 2015-10-01: qty 0.5

## 2015-10-01 MED ORDER — ATORVASTATIN CALCIUM 40 MG PO TABS
40.0000 mg | ORAL_TABLET | Freq: Every day | ORAL | Status: DC
  Administered 2015-10-01 – 2015-10-02 (×2): 40 mg via ORAL
  Filled 2015-10-01 (×2): qty 1

## 2015-10-01 MED ORDER — RADIAPLEXRX EX GEL: 1.0000 "application " | CUTANEOUS | Status: DC | PRN

## 2015-10-01 MED ORDER — METOPROLOL SUCCINATE ER 25 MG PO TB24
50.0000 mg | ORAL_TABLET | Freq: Every day | ORAL | Status: DC
  Administered 2015-10-01 – 2015-10-02 (×2): 50 mg via ORAL
  Filled 2015-10-01 (×3): qty 2

## 2015-10-01 MED ORDER — FAMOTIDINE IN NACL 20-0.9 MG/50ML-% IV SOLN
20.0000 mg | Freq: Two times a day (BID) | INTRAVENOUS | Status: DC
  Administered 2015-10-01 – 2015-10-02 (×4): 20 mg via INTRAVENOUS
  Filled 2015-10-01 (×4): qty 50

## 2015-10-01 MED ORDER — LEVOFLOXACIN IN D5W 750 MG/150ML IV SOLN
750.0000 mg | INTRAVENOUS | Status: AC
  Administered 2015-10-01: 750 mg via INTRAVENOUS
  Filled 2015-10-01 (×2): qty 150

## 2015-10-01 MED ORDER — NITROGLYCERIN 0.4 MG SL SUBL: 0.4000 mg | SUBLINGUAL_TABLET | SUBLINGUAL | Status: DC | PRN

## 2015-10-01 MED ORDER — IPRATROPIUM-ALBUTEROL 0.5-2.5 (3) MG/3ML IN SOLN
3.0000 mL | Freq: Three times a day (TID) | RESPIRATORY_TRACT | Status: DC
  Administered 2015-10-01 – 2015-10-02 (×2): 3 mL via RESPIRATORY_TRACT
  Filled 2015-10-01 (×3): qty 3

## 2015-10-01 MED ORDER — SODIUM CHLORIDE 0.9 % IV BOLUS (SEPSIS)
1500.0000 mL | Freq: Once | INTRAVENOUS | Status: AC
  Administered 2015-10-01: 1500 mL via INTRAVENOUS

## 2015-10-01 MED ORDER — SACCHAROMYCES BOULARDII 250 MG PO CAPS
250.0000 mg | ORAL_CAPSULE | Freq: Two times a day (BID) | ORAL | Status: DC
  Administered 2015-10-01 – 2015-10-02 (×2): 250 mg via ORAL
  Filled 2015-10-01 (×2): qty 1

## 2015-10-01 MED ORDER — IPRATROPIUM-ALBUTEROL 0.5-2.5 (3) MG/3ML IN SOLN
3.0000 mL | RESPIRATORY_TRACT | Status: DC
  Administered 2015-10-01 (×2): 3 mL via RESPIRATORY_TRACT
  Filled 2015-10-01 (×3): qty 3

## 2015-10-01 MED ORDER — METOPROLOL SUCCINATE ER 25 MG PO TB24
100.0000 mg | ORAL_TABLET | Freq: Every day | ORAL | Status: DC
  Administered 2015-10-01: 100 mg via ORAL
  Filled 2015-10-01: qty 1
  Filled 2015-10-01: qty 4

## 2015-10-01 MED ORDER — GUAIFENESIN ER 600 MG PO TB12
600.0000 mg | ORAL_TABLET | Freq: Two times a day (BID) | ORAL | Status: DC
  Administered 2015-10-01 – 2015-10-02 (×4): 600 mg via ORAL
  Filled 2015-10-01 (×4): qty 1

## 2015-10-01 MED ORDER — SODIUM CHLORIDE 0.9 % IV SOLN
INTRAVENOUS | Status: DC
  Administered 2015-10-01: 03:00:00 via INTRAVENOUS

## 2015-10-01 MED ORDER — LEVOFLOXACIN IN D5W 750 MG/150ML IV SOLN
750.0000 mg | INTRAVENOUS | Status: DC
  Administered 2015-10-02: 750 mg via INTRAVENOUS
  Filled 2015-10-01: qty 150

## 2015-10-01 MED ORDER — MAGNESIUM SULFATE 2 GM/50ML IV SOLN
2.0000 g | Freq: Once | INTRAVENOUS | Status: AC
  Administered 2015-10-01: 2 g via INTRAVENOUS
  Filled 2015-10-01: qty 50

## 2015-10-01 MED ORDER — HYDROCORTISONE NA SUCCINATE PF 100 MG IJ SOLR
50.0000 mg | Freq: Three times a day (TID) | INTRAMUSCULAR | Status: DC
  Administered 2015-10-01 – 2015-10-02 (×4): 50 mg via INTRAVENOUS
  Filled 2015-10-01 (×4): qty 2

## 2015-10-01 MED ORDER — ALLOPURINOL 100 MG PO TABS
300.0000 mg | ORAL_TABLET | Freq: Every day | ORAL | Status: DC
  Administered 2015-10-01 – 2015-10-02 (×3): 300 mg via ORAL
  Filled 2015-10-01: qty 3
  Filled 2015-10-01: qty 1
  Filled 2015-10-01: qty 3

## 2015-10-01 MED ORDER — LEVOTHYROXINE SODIUM 75 MCG PO TABS
75.0000 ug | ORAL_TABLET | Freq: Every day | ORAL | Status: DC
  Administered 2015-10-01 – 2015-10-02 (×2): 75 ug via ORAL
  Filled 2015-10-01 (×2): qty 1

## 2015-10-01 NOTE — Progress Notes (Signed)
ANTIBIOTIC CONSULT NOTE - INITIAL  Pharmacy Consult for Levaquin Indication: Bronchitis and possible UTI  Allergies  Allergen Reactions  . Levaquin [Levofloxacin In D5w] Nausea And Vomiting  . Adhesive [Tape]     Pulls skin off  . Morphine And Related Nausea And Vomiting    Patient Measurements:    Vital Signs: Temp: 98 F (36.7 C) (10/19 2329) Temp Source: Temporal (10/19 2329) BP: 116/77 mmHg (10/20 0030) Pulse Rate: 88 (10/19 2329) Intake/Output from previous day:   Intake/Output from this shift:    Labs:  Recent Labs  09/30/15 2151  WBC 4.3  HGB 9.0*  PLT 94*  CREATININE 1.28*   Estimated Creatinine Clearance: 70.4 mL/min (by C-G formula based on Cr of 1.28). No results for input(s): VANCOTROUGH, VANCOPEAK, VANCORANDOM, GENTTROUGH, GENTPEAK, GENTRANDOM, TOBRATROUGH, TOBRAPEAK, TOBRARND, AMIKACINPEAK, AMIKACINTROU, AMIKACIN in the last 72 hours.   Microbiology: Recent Results (from the past 720 hour(s))  Blood Culture (routine x 2)     Status: None   Collection Time: 09/10/15  1:37 PM  Result Value Ref Range Status   Specimen Description BLOOD LEFT ANTECUBITAL  Final   Special Requests BOTTLES DRAWN AEROBIC AND ANAEROBIC 5ML  Final   Culture   Final    NO GROWTH 5 DAYS Performed at [facility]    Report Status 09/15/2015 FINAL  Final  Blood Culture (routine x 2)     Status: None   Collection Time: 09/10/15  1:37 PM  Result Value Ref Range Status   Specimen Description BLOOD LEFT ARM  Final   Special Requests BOTTLES DRAWN AEROBIC AND ANAEROBIC [place]  Final   Culture   Final    NO GROWTH 5 DAYS Performed at [facility]    Report Status 09/15/2015 FINAL  Final  MRSA PCR Screening     Status: None   Collection Time: 09/10/15  8:37 PM  Result Value Ref Range Status   MRSA by PCR NEGATIVE NEGATIVE Final    Comment:        The GeneXpert MRSA Assay (FDA approved for NASAL specimens only), is one component of a comprehensive MRSA  colonization surveillance program. It is not intended to diagnose MRSA infection nor to guide or monitor treatment for MRSA infections.   Culture, Urine     Status: None   Collection Time: 09/11/15  1:10 AM  Result Value Ref Range Status   Specimen Description URINE, RANDOM  Final   Special Requests NONE  Final   Culture   Final    NO GROWTH 1 DAY Performed at [facility]    Report Status 09/12/2015 FINAL  Final    Medical History: Past Medical History  Diagnosis Date  . Hyperlipidemia   . Hypertension   . Coronary heart disease     a. s/p CABG x 3 (VG->PDA, VG->PL, LIMA->LAD);  b. 2013 Abnl Myoview;  c. 2013 Cath: 3/3 patent grafts, occluded LCX which correlated w/ ischemia on MV->not amenable to PCI->Med Rx.  . Obesity   . CHF (congestive heart failure) ([place])   . VT (ventricular tachycardia) ([place])   . SVT (supraventricular tachycardia) ([place])   . PAD (peripheral artery disease) ([place])     Angiography in February of 2014: Moderate left iliac artery stenosis, mild to moderate diffuse right SFA disease. Severe proximal right popliteal artery stenosis with three-vessel runoff below the knee. Status post self-expanding stent placement to the proximal popliteal artery  . Implantable cardioverter-defibrillator Mdt   . Asthma     "  small touch" (07/29/2013)  . History of pneumonia     "3-4 times; last time ~ 2003" (07/29/2013)  . OSA on CPAP   . GERD (gastroesophageal reflux disease)   . H/O hiatal hernia   . Gout   . Anxiety   . Allergy   . Stomach cancer ([place]) 07/23/2015    invasive adenocarcinoma ,esophagus ,distal   . Type II diabetes mellitus ([place])     on insulin  . Sleep apnea   . Hyperlipidemia   . PAD (peripheral artery disease) ([place])   . CHF (congestive heart failure) ([place])   . A-fib ([place])   . CKD (chronic kidney disease), stage III     Medications:  Scheduled:  . allopurinol  300 mg Oral Daily  . apixaban  5 mg Oral BID  . atorvastatin  40 mg Oral  Daily  . digoxin  0.25 mg Oral Daily  . guaiFENesin  600 mg Oral BID  . insulin aspart  0-9 Units Subcutaneous TID WC  . ipratropium-albuterol  3 mL Nebulization Q4H  . levothyroxine  75 mcg Oral QAC breakfast  . metoprolol succinate  100 mg Oral Daily  . sodium chloride  3 mL Intravenous Q12H  . sucralfate  1 g Oral QID   Infusions:  . sodium chloride    . calcium gluconate 1 g (10/01/15 0012)  . famotidine (PEPCID) IV    . levofloxacin (LEVAQUIN) IV    . [START ON 10/02/2015] levofloxacin (LEVAQUIN) IV    . sodium chloride     Assessment:  69 yr male with h/o gastric cancer.  Presents with weakness, N/V, tachycardia, burning upon urination  U/A, urine & blood cultures ordered  Pharmacy consulted to dose Levaquin  10/20 >>Levaquin >>  10/20 blood: 10/20 urine:  Goal of Therapy:  Eradication of infection  Plan:  Follow up culture results  Levaquin 750mg  IV q24h  Abid Bolla, Toribio Harbour, PharmD 10/01/2015,1:04 AM

## 2015-10-01 NOTE — Progress Notes (Signed)
Initial Nutrition Assessment  DOCUMENTATION CODES:   Severe malnutrition in context of chronic illness  INTERVENTION:   -Provide Ensure Enlive po BID, each supplement provides 350 kcal and 20 grams of protein -Provide Magic cup TID with meals, each supplement provides 290 kcal and 9 grams of protein -Provide daily snack of [employer] pudding -Encourage PO intake -RD to continue to monitor  NUTRITION DIAGNOSIS:   Malnutrition related to chronic illness, cancer and cancer related treatments as evidenced by percent weight loss, energy intake < or equal to 75% for > or equal to 1 month.  GOAL:   Patient will meet greater than or equal to 90% of their needs  MONITOR:   PO intake, Supplement acceptance, Labs, Weight trends, Skin, I & O's  REASON FOR ASSESSMENT:   Malnutrition Screening Tool    ASSESSMENT:   69 y.o. male with PMH of gastroesophageal cancer (completed radiation chemotherapy last week), diabetes mellitus, hyperlipidemia, hypothyroidism, GERD, gout, CAD, S/P CABG, (systolic and diastolic congestive heart failure), AICD, atrial fibrillation on Eliqis, OSA on CPAP, PAD, CKD-III, presents with generalized weakness, dizziness, nausea, vomiting, cough and burning on urination.  Pt reports poor appetite for almost a month d/t chemo and radiation. Pt states he was having trouble keeping water and gatorade down. He states that he bought Ensure supplements and refrigerated them and he vomited them up and was wondering if it was because he refrigerated them. Pt can tolerate milkshakes from [place] and ice cream. RD explained it is okay to refrigerate Ensure.   Pt would like to try Ensure at room temperature. RD to order Magic Cups and [employer] for patient as well.  Patient wondered if it was okay for a family member to bring him a milkshake, pt is on full liquid diet. RD approved.  Per weight history, pt has lost 24 lb since 9/8 (10% weight loss x 2 months,  significant for time frame).  Nutrition focused physical exam shows no sign of depletion of muscle mass or body fat.  Labs reviewed: Low Na  Diet Order:  Diet full liquid Room service appropriate?: Yes; Fluid consistency:: Thin  Skin:  Reviewed, no issues  Last BM:  10/19  Height:   Ht Readings from Last 1 Encounters:  10/01/15 6' (1.829 m)    Weight:   Wt Readings from Last 1 Encounters:  10/01/15 219 lb 5.7 oz (99.5 kg)    Ideal Body Weight:  80.9 kg  BMI:  Body mass index is 29.74 kg/(m^2).  Estimated Nutritional Needs:   Kcal:  2100-2300  Protein:  110-120g  Fluid:  2L/day  EDUCATION NEEDS:   No education needs identified at this time  Clayton Bibles, MS, RD, LDN Pager: [phone] After Hours Pager: [phone]

## 2015-10-01 NOTE — Progress Notes (Signed)
[facility] PROGRESS NOTE  Walter French PQD:[id number] DOB: February 01, 1946 DOA: 09/30/2015 PCP: Walter Einstein, MD  Assessment/Plan: 1-SIRS/Early sepsis: presumed to be secondary to UTI/bronchitis.  -sepsis features improving/resolving (no fever, improved in his RR, BP and HR; also normalization of his lactic acid -will follow culture data  -will continue current IV antibiotics -will add flutter valve  2-gastroesophageal Cancer: oncology informed of admission -will follow rec's  3-nausea/vomiting: improved -will continue PRN antiemetics -replete electrolytes and follow clinical course/response  4-CAD with hx of CABG: no CP -will continue metoprolol (reduced dose given low BP) and statins -monitor on telemetry  5-HLD: continue statins   6-chronic combined HF: last EF 30-35% -will follow daily weights, strict intake and output -currently compensated -continue b-blocker at adjusted dose   7-acute on chronic renal failure: due to hypotension and pre-renal azotemia most likely -continue IVF's -will follow renal US and function trend  8-Diabetes type 2 with nephropathy -will continue SSI -last A1C 6.8  9-Pancytopenia due to antineoplastic chemotherapy [facility]): Hemoglobin 9.0 on admission, platelet 94, no bleeding tendency -f/u by cbc  10-hypothyroidism: will continue synthroid  11-Atrial Fibrillation with RVR: CHA2DS2-VASc Score is 5, needs oral anticoagulation. Patient is on Eliquis at home. INR is on admission. Heart rate is 124/min on admission. -continue Eliquis and metoprolol  Code Status: Full Family Communication: no family at bedside Disposition Plan: will keep in stepdown, follow VS closely, continue IVF's, continue adjusted dose of metoprolol, continue antibiotics    Consultants:  None   Procedures:  See below for x-ray reports    Antibiotics:  levaquin  10/20  HPI/Subjective: Still with soft BP, no fever. Denies CP and SOB. Still with  productive intermittent cough and tachypnea.  Objective: Filed Vitals:   10/01/15 0800  BP:   Pulse:   Temp: 98.2 F (36.8 C)  Resp:     Intake/Output Summary (Last 24 hours) at 10/01/15 0849 Last data filed at 10/01/15 0600  Gross per 24 hour  Intake   2440 ml  Output      0 ml  Net   2440 ml   Filed Weights   10/01/15 0200  Weight: 99.5 kg (219 lb 5.7 oz)    Exam:   General:  Afebrile, no CP. Reporting nausea/vomiting better. Patient is still experiencing tachypnea and with some intermittent episodes of productive cough. BP is still soft  Cardiovascular: tachycardic, no rubs, no gallops or murmurs; no JVD  Respiratory: no wheezing, positive scattered rhonchi, no crackles  Abdomen: soft, no tender, no guarding, positive BS  Musculoskeletal: no edema, no cyanosis   Data Reviewed: Basic Metabolic Panel:  Recent Labs Lab 09/30/15 2151 10/01/15 0245  NA 132* 133*  K 4.0 3.8  CL 99* 102  CO2 26 26  GLUCOSE 125* 109*  BUN 16 15  CREATININE 1.28* 1.00  CALCIUM 8.7* 8.2*   Liver Function Tests:  Recent Labs Lab 09/30/15 2151 10/01/15 0245  AST 35 28  ALT 9* 9*  ALKPHOS 112 99  BILITOT 2.2* 2.3*  PROT 6.3* 5.9*  ALBUMIN 3.0* 2.9*    Recent Labs Lab 10/01/15 0245  LIPASE 35   CBC:  Recent Labs Lab 09/30/15 2151 10/01/15 0245  WBC 4.3 3.5*  NEUTROABS 2.4  --   HGB 9.0* 8.6*  HCT 26.5* 25.4*  MCV 84.1 83.6  PLT 94* 61*   BNP (last 3 results)  Recent Labs  07/20/15 0512 09/10/15 1844 09/30/15 2156  BNP 217.8* 254.6* 704.3*   CBG:  Recent  Labs Lab 10/01/15 0344 10/01/15 0730  GLUCAP 107* 112*    Recent Results (from the past 240 hour(s))  Blood Culture (routine x 2)     Status: None (Preliminary result)   Collection Time: 09/30/15 10:07 PM  Result Value Ref Range Status   Specimen Description BLOOD RAC  Final   Special Requests BOTTLES DRAWN AEROBIC AND ANAEROBIC 5ML  Final   Culture PENDING  Incomplete   Report Status  PENDING  Incomplete     Studies: Dg Chest 2 View  09/30/2015  CLINICAL DATA:  Shortness of breath and cough for 3 days, generalized weakness EXAM: CHEST - 2 VIEW COMPARISON:  9/29/616 FINDINGS: Cardiac shadow is enlarged. A defibrillator is again seen. Postsurgical changes are noted. Lingular atelectatic changes are again seen. No sizable effusion is noted. No acute bony abnormality is seen. IMPRESSION: Stable changes in the lingula.  No acute abnormality noted. Electronically Signed   By: Inez Catalina M.D.   On: 09/30/2015 22:12    Scheduled Meds: . allopurinol  300 mg Oral Daily  . apixaban  5 mg Oral BID  . atorvastatin  40 mg Oral Daily  . digoxin  0.25 mg Oral Daily  . famotidine (PEPCID) IV  20 mg Intravenous Q12H  . guaiFENesin  600 mg Oral BID  . hydrocortisone sod succinate (SOLU-CORTEF) inj  50 mg Intravenous Q8H  . insulin aspart  0-9 Units Subcutaneous TID WC  . ipratropium-albuterol  3 mL Nebulization Q4H  . [START ON 10/02/2015] levofloxacin (LEVAQUIN) IV  750 mg Intravenous Q24H  . levothyroxine  75 mcg Oral QAC breakfast  . magnesium sulfate 1 - 4 g bolus IVPB  2 g Intravenous Once  . metoprolol succinate  50 mg Oral Daily  . sodium chloride  3 mL Intravenous Q12H  . sucralfate  1 g Oral QID   Continuous Infusions: . sodium chloride 100 mL/hr at 10/01/15 0230    Principal Problem:   Sepsis ([place]) Active Problems:   Hyperlipidemia   Coronary artery disease-status post CABG   Systolic and diastolic CHF, chronic (HCC)   Gastroesophageal cancer ([place])   Acute renal failure superimposed on stage 3 chronic kidney disease ([place])   Pancytopenia due to antineoplastic chemotherapy ([place])   Type 2 diabetes mellitus with renal manifestations (HCC)   Hypothyroidism   Gout   Weakness generalized   Dehydration   Cough   Nausea with vomiting    Time spent: 35 minutes    Barton Dubois  [facility] Pager [phone]. If 7PM-7AM, please contact night-coverage at  [email or web address], password [facility] 10/01/2015, 8:49 AM  LOS: 0 days

## 2015-10-01 NOTE — Progress Notes (Signed)
Patient states he does not wear CPAP at home. Patient has refused CPAP tonight. Patient sat 98% on RA. Patient in no distress. RT will continue to monitor as needed.

## 2015-10-01 NOTE — Evaluation (Signed)
Physical Therapy Evaluation Patient Details Name: KEOKI MCHARGUE MRN: [id number] DOB: 1946/10/22 Today's Date: 10/01/2015   History of Present Illness  69 yo male admitted with sepsis. hx of DM, CHF, gastric cancer, SVT, HTN, PAD, VT, obesity, CABG, gout, A fib. Recent d/c 09/15/15.   Clinical Impression  On eval, pt required Min assist for mobility-walked ~50 feet. Pt fatigues fairly easily. Pt c/o not feeling well today. Will follow and progress activity as able.     Follow Up Recommendations Home health PT;Supervision - Intermittent    Equipment Recommendations  None recommended by PT    Recommendations for Other Services       Precautions / Restrictions Precautions Precautions: Fall Restrictions Weight Bearing Restrictions: No      Mobility  Bed Mobility Overal bed mobility: Modified Independent                Transfers Overall transfer level: Needs assistance   Transfers: Sit to/from Stand Sit to Stand: Supervision         General transfer comment: for safety  Ambulation/Gait Ambulation/Gait assistance: Min assist Ambulation Distance (Feet): 50 Feet Assistive device: None Gait Pattern/deviations: Step-through pattern;Decreased stride length     General Gait Details: small amount of assist given to steady pt throughout ambulation distance. pt fatigues fairly easily.   Stairs            Wheelchair Mobility    Modified Rankin (Stroke Patients Only)       Balance Overall balance assessment: Needs assistance         Standing balance support: During functional activity Standing balance-Leahy Scale: Good                               Pertinent Vitals/Pain Pain Assessment: No/denies pain    Home Living Family/patient expects to be discharged to:: Private residence Living Arrangements: Spouse/significant other     Home Access: Stairs to enter Entrance Stairs-Rails: Right Entrance Stairs-Number of Steps: 5 Home  Layout: One level Home Equipment: None      Prior Function Level of Independence: Independent               Hand Dominance        Extremity/Trunk Assessment   Upper Extremity Assessment: Generalized weakness           Lower Extremity Assessment: Generalized weakness      Cervical / Trunk Assessment: Normal  Communication   Communication: No difficulties  Cognition Arousal/Alertness: Awake/alert Behavior During Therapy: WFL for tasks assessed/performed Overall Cognitive Status: Within Functional Limits for tasks assessed                      General Comments      Exercises        Assessment/Plan    PT Assessment Patient needs continued PT services  PT Diagnosis Difficulty walking;Generalized weakness   PT Problem List Decreased strength;Decreased activity tolerance;Decreased balance;Decreased mobility  PT Treatment Interventions DME instruction;Gait training;Functional mobility training;Therapeutic activities;Patient/family education;Balance training;Therapeutic exercise   PT Goals (Current goals can be found in the Care Plan section) Acute Rehab PT Goals Patient Stated Goal: to feel better PT Goal Formulation: With patient Time For Goal Achievement: 10/15/15 Potential to Achieve Goals: Good    Frequency Min 3X/week   Barriers to discharge        Co-evaluation  End of Session Equipment Utilized During Treatment: Gait belt Activity Tolerance: Patient limited by fatigue Patient left: in bed;with call bell/phone within reach           Time: 1154-1205 PT Time Calculation (min) (ACUTE ONLY): 11 min   Charges:   PT Evaluation $Initial PT Evaluation Tier I: 1 Procedure     PT G Codes:        Weston Anna, MPT Pager: [phone]

## 2015-10-02 ENCOUNTER — Ambulatory Visit: Payer: Medicare Other | Admitting: Cardiology

## 2015-10-02 ENCOUNTER — Encounter: Payer: Self-pay | Admitting: Hematology and Oncology

## 2015-10-02 ENCOUNTER — Other Ambulatory Visit: Payer: Self-pay | Admitting: Nurse Practitioner

## 2015-10-02 DIAGNOSIS — M1000 Idiopathic gout, unspecified site: Secondary | ICD-10-CM

## 2015-10-02 DIAGNOSIS — E860 Dehydration: Secondary | ICD-10-CM

## 2015-10-02 DIAGNOSIS — D61810 Antineoplastic chemotherapy induced pancytopenia: Secondary | ICD-10-CM

## 2015-10-02 DIAGNOSIS — C160 Malignant neoplasm of cardia: Secondary | ICD-10-CM

## 2015-10-02 DIAGNOSIS — I481 Persistent atrial fibrillation: Secondary | ICD-10-CM

## 2015-10-02 DIAGNOSIS — N390 Urinary tract infection, site not specified: Secondary | ICD-10-CM

## 2015-10-02 DIAGNOSIS — T451X5A Adverse effect of antineoplastic and immunosuppressive drugs, initial encounter: Secondary | ICD-10-CM

## 2015-10-02 LAB — GLUCOSE, CAPILLARY: Glucose-Capillary: 141 mg/dL — ABNORMAL HIGH (ref 65–99)

## 2015-10-02 LAB — BASIC METABOLIC PANEL
Anion gap: 5 (ref 5–15)
BUN: 14 mg/dL (ref 6–20)
CALCIUM: 8.8 mg/dL — AB (ref 8.9–10.3)
CO2: 24 mmol/L (ref 22–32)
Chloride: 105 mmol/L (ref 101–111)
Creatinine, Ser: 0.9 mg/dL (ref 0.61–1.24)
GFR calc Af Amer: >60 mL/min (ref >60)
GLUCOSE: 163 mg/dL — AB (ref 65–99)
Potassium: 4.1 mmol/L (ref 3.5–5.1)
Sodium: 134 mmol/L — ABNORMAL LOW (ref 135–145)

## 2015-10-02 LAB — MAGNESIUM: Magnesium: 2.2 mg/dL (ref 1.7–2.4)

## 2015-10-02 LAB — CBC
HCT: 27.3 % — ABNORMAL LOW (ref 39.0–52.0)
Hemoglobin: 9.1 g/dL — ABNORMAL LOW (ref 13.0–17.0)
MCH: 27.9 pg (ref 26.0–34.0)
MCHC: 33.3 g/dL (ref 30.0–36.0)
MCV: 83.7 fL (ref 78.0–100.0)
PLATELETS: 83 10*3/uL — AB (ref 150–400)
RBC: 3.26 MIL/uL — ABNORMAL LOW (ref 4.22–5.81)
RDW: 19.1 % — AB (ref 11.5–15.5)
WBC: 3.8 10*3/uL — ABNORMAL LOW (ref 4.0–10.5)

## 2015-10-02 LAB — ABO/RH: ABO/RH(D): O POS

## 2015-10-02 MED ORDER — SACCHAROMYCES BOULARDII 250 MG PO CAPS: 250.0000 mg | ORAL_CAPSULE | Freq: Two times a day (BID) | ORAL | Status: DC

## 2015-10-02 MED ORDER — LEVOFLOXACIN 750 MG PO TABS: 750.0000 mg | ORAL_TABLET | Freq: Every day | ORAL | Status: DC

## 2015-10-02 MED ORDER — METOPROLOL TARTRATE 1 MG/ML IV SOLN
5.0000 mg | Freq: Once | INTRAVENOUS | Status: AC
  Administered 2015-10-02: 5 mg via INTRAVENOUS
  Filled 2015-10-02: qty 5

## 2015-10-02 MED ORDER — GUAIFENESIN ER 600 MG PO TB12: 600.0000 mg | ORAL_TABLET | Freq: Two times a day (BID) | ORAL | Status: DC

## 2015-10-02 NOTE — Discharge Summary (Signed)
Physician Discharge Summary  Walter French PFX:[id number] DOB: 24-Nov-1946 DOA: 09/30/2015  PCP: Karlene Einstein, MD  Admit date: 09/30/2015 Discharge date: 10/02/2015  Time spent: 40 minutes  Recommendations for Outpatient Follow-up:  Check CBC to follow Hgb and platelets trend Check BMET to follow electrolytes and renal function  Discharge Diagnoses:  Principal Problem:   Sepsis ([place]) Active Problems:   Hyperlipidemia   Coronary artery disease-status post CABG   Systolic and diastolic CHF, chronic (HCC)   Gastroesophageal cancer ([place])   Acute renal failure superimposed on stage 3 chronic kidney disease (HCC)   Pancytopenia due to antineoplastic chemotherapy ([place])   Type 2 diabetes mellitus with renal manifestations (HCC)   Hypothyroidism   Gout   Weakness generalized   Dehydration   Cough   Nausea with vomiting   Urinary tract infectious disease   Discharge Condition: stable and improved. No CP, no SOB. Patient wants to be discharged home. will follow up with PCP in 10 days  Diet recommendation: heart healthy and low carbohydrates   Filed Weights   10/01/15 0200 10/02/15 0500  Weight: 99.5 kg (219 lb 5.7 oz) 101.9 kg (224 lb 10.4 oz)    History of present illness:  69 y.o. male with PMH of gastroesophageal cancer (completed radiation chemotherapy last week), diabetes mellitus, hyperlipidemia, hypothyroidism, GERD, gout, CAD, S/P CABG, (systolic and diastolic congestive heart failure), AICD, atrial fibrillation on Eliqis, OSA on CPAP, PAD, CKD-III, presents with generalized weakness, dizziness, nausea, vomiting, cough and burning on urination.  Patient reports that he completed radiation and chemotherapy last week for gastroesophageal cancer. He has been having nausea and vomiting in the past 3 weeks. He vomited 2 times today, no abdominal pain or diarrhea. He feels dizzy and weaker. He has poor balance. He reports that he has been having SOB and cough with white mucus  production, but no chest pain, fever or chills. He also reports burning on urination, but no dysuria or increased urinary frequency. Vision does not have rashes, unilateral weakness, numbness or tingling sensations in his extremities, no vision change, hearing loss.  In ED, patient was found to have low blood pressure 86/54, tachycardia, tachypnea, normal temperature, lactate is 2.62, WBC 4.3, negative troponin, BNP 704.3, negative chest x-ray w/o acute infiltrates and UA with positive LA and nitrites   Hospital Course:  1-SIRS/Early sepsis: presumed to be secondary to UTI/bronchitis.  -sepsis features improving/resolving (no fever, improved in his RR and BP and also normalization of his lactic acid -will follow final culture data (no blood growth and final urine cx pending); patient SIRS/early sepsis features resolved and patient demanding to be discharge -advise to maintain adequate hydration -will encourage him to continue using flutter valve (for bronchitis and atelectasis) -Will go home and finish antibiotic therapy by mouth   2-gastroesophageal Cancer: oncology informed of this admission -will follow with oncology service as an outpatient   3-nausea/vomiting: improved/resolved -will continue home PRN antiemetics  4-CAD with hx of CABG: no CP -will continue metoprolol (reduced dose given low BP) and statins -no abnormalities on telemetry appreciated during admission    5-HLD: continue statins   6-chronic combined HF: last EF 30-35% -will recommend close follow up on his daily weights -low sodium diet -continue B-blocker -as an outpatient will benefit of starting ACE or ARB; not done at discharge given recent ARF -currently compensated  7-acute on chronic renal failure: due to hypotension, UTI and pre-renal azotemia most likely -resolved with IVF's resuscitation -tx for UTI -and normalization  of BP  8-Diabetes type 2 with nephropathy -will continue home hypoglycemic  regimen -victoza, glipizide and  SSI -last A1C 6.8 -advise to watch carbohydrates intake in his diet  9-Pancytopenia due to antineoplastic chemotherapy [facility]): Hemoglobin 9.0 on admission, platelet 94, no overt bleeding  -f/u CBC during hospital follow up appointment   10-hypothyroidism: will continue synthroid  11-Atrial Fibrillation with RVR: CHA2DS2-VASc Score is 5, needs oral anticoagulation. Patient is on Eliquis at home.  -continue Eliquis and metoprolol -per patient his HR has always fluctuates and goes up and down   Procedures:  See below for x-ray reports   Consultations:  None   Discharge Exam: Filed Vitals:   10/02/15 0953  BP:   Pulse: 120  Temp:   Resp: 22    General: afebrile, feeling much better and wanting to go home. No nausea, no vomiting. Patient denies CP and/or SOB. No abd pain and denies dysuria at this point Cardiovascular: tachycardiac, irregular, no rubs or gallops, no JVD Respiratory: no wheezing or crackles appreciated on exam ; good air movement  Abd: soft, NT, ND, positive BS Extremities: no edema or cyanosis    Discharge Instructions   Discharge Instructions    Diet - low sodium heart healthy    Complete by:  As directed      Discharge instructions    Complete by:  As directed   Follow heart healthy diet Take medications as prescribed Please arrange follow up with PCP in 10 days Keep yourself well hydrated          Current Discharge Medication List    START taking these medications   Details  guaiFENesin (MUCINEX) 600 MG 12 hr tablet Take 1 tablet (600 mg total) by mouth 2 (two) times daily. Qty: 40 tablet, Refills: 0    levofloxacin (LEVAQUIN) 750 MG tablet Take 1 tablet (750 mg total) by mouth daily. Qty: 7 tablet, Refills: 0    saccharomyces boulardii (FLORASTOR) 250 MG capsule Take 1 capsule (250 mg total) by mouth 2 (two) times daily. Qty: 60 capsule, Refills: 0      CONTINUE these medications which have NOT CHANGED    Details  acetaminophen (TYLENOL) 500 MG tablet Take 1,000 mg by mouth every 6 (six) hours as needed for moderate pain or headache.    allopurinol (ZYLOPRIM) 300 MG tablet Take 300 mg by mouth daily.      apixaban (ELIQUIS) 5 MG TABS tablet Take 1 tablet (5 mg total) by mouth 2 (two) times daily. Qty: 60 tablet, Refills: 0    atorvastatin (LIPITOR) 40 MG tablet Take 1 tablet (40 mg total) by mouth daily. Qty: 90 tablet, Refills: 3    digoxin (LANOXIN) 0.25 MG tablet Take 1 tablet (0.25 mg total) by mouth daily. Qty: 30 tablet, Refills: 0    glipiZIDE (GLUCOTROL) 10 MG tablet Take 10 mg by mouth 2 (two) times daily before a meal.     insulin aspart (NOVOLOG FLEXPEN) 100 UNIT/ML FlexPen  Use the following scale to administer insulin depending on CBG              CBG 121 - 150:  2  units    CBG 151 - 200: 3 units    CBG 201 - 250: 5 units    CBG 251 - 300: 8 units    CBG 301 - 350: 11 units    CBG 351 - 400: 15 units Qty: 15 mL, Refills: 1    levothyroxine (SYNTHROID, LEVOTHROID) 75 MCG  tablet Take 75 mcg by mouth daily before breakfast.  Refills: 0    Liraglutide (VICTOZA) 18 MG/3ML SOPN Inject 1.8 mg into the skin daily.    metoprolol succinate (TOPROL-XL) 100 MG 24 hr tablet Take 1 tablet by mouth daily. Refills: 0    nitroGLYCERIN (NITROSTAT) 0.4 MG SL tablet Place 1 tablet (0.4 mg total) under the tongue every 5 (five) minutes x 3 doses as needed for chest pain. Qty: 25 tablet, Refills: 3    ondansetron (ZOFRAN) 8 MG tablet Take 1 tablet (8 mg total) by mouth every 8 (eight) hours as needed for nausea or vomiting. Qty: 20 tablet, Refills: 0   Associated Diagnoses: Nausea without vomiting    prochlorperazine (COMPAZINE) 10 MG tablet Take 1 tablet (10 mg total) by mouth every 6 (six) hours as needed for nausea or vomiting. Qty: 30 tablet, Refills: 1    ranitidine (ZANTAC) 300 MG tablet Take 1 tablet (300 mg total) by mouth daily as needed for heartburn. Qty:  30 tablet, Refills: 6    sucralfate (CARAFATE) 1 G tablet Take 1 tablet (1 g total) by mouth 4 (four) times daily. Qty: 120 tablet, Refills: 2    traMADol (ULTRAM) 50 MG tablet Take 1 tablet (50 mg total) by mouth every 6 (six) hours as needed for moderate pain. Qty: 30 tablet, Refills: 0   Associated Diagnoses: Esophageal cancer (HCC)    dexamethasone (DECADRON) 4 MG tablet Take 10 mg at 10pm..... night before chemotherapy Qty: 10 tablet, Refills: 0    hyaluronate sodium (RADIAPLEXRX) GEL Apply 1 application topically as needed (treatment).     HYDROcodone-acetaminophen (NORCO/VICODIN) 5-325 MG per tablet TAKE 1 TABLET BY MOUTH 4 TIMES DAILY AS NEEDED FOR PAIN Qty: 60 tablet, Refills: 0   Associated Diagnoses: Gastroesophageal cancer ([place])      STOP taking these medications     metoprolol (LOPRESSOR) 50 MG tablet        Allergies  Allergen Reactions  . Levaquin [Levofloxacin In D5w] Nausea And Vomiting  . Adhesive [Tape]     Pulls skin off  . Morphine And Related Nausea And Vomiting   Follow-up Information    Follow up with Karlene Einstein, MD. Schedule an appointment as soon as possible for a visit in 10 days.   Specialty:  Family Medicine   Contact information:   [address] [place] [address] [phone]       The results of significant diagnostics from this hospitalization (including imaging, microbiology, ancillary and laboratory) are listed below for reference.    Significant Diagnostic Studies: Dg Chest 2 View  09/30/2015  CLINICAL DATA:  Shortness of breath and cough for 3 days, generalized weakness EXAM: CHEST - 2 VIEW COMPARISON:  9/29/616 FINDINGS: Cardiac shadow is enlarged. A defibrillator is again seen. Postsurgical changes are noted. Lingular atelectatic changes are again seen. No sizable effusion is noted. No acute bony abnormality is seen. IMPRESSION: Stable changes in the lingula.  No acute abnormality noted. Electronically Signed   By: Inez Catalina M.D.   On: 09/30/2015 22:12   Ct Angio Chest Pe W/cm &/or Wo Cm  09/10/2015  CLINICAL DATA:  Presyncope, esophageal carcinoma EXAM: CT ANGIOGRAPHY CHEST WITH CONTRAST TECHNIQUE: Multidetector CT imaging of the chest was performed using the standard protocol during bolus administration of intravenous contrast. Multiplanar CT image reconstructions and MIPs were obtained to evaluate the vascular anatomy. CONTRAST:  133mL OMNIPAQUE IOHEXOL 350 MG/ML SOLN COMPARISON:  Chest radiograph same date, PET-CT 08/07/2015  FINDINGS: Mediastinum/Nodes: Left-sided AICD in place. Evidence of CABG. Great vessels are normal in caliber. Moderate atheromatous coronary arterial and aortic calcification. Mild esophageal distention is identified. The previously evaluated mass of the distal esophagus is not specifically re- visualized without oral contrast today. Periesophageal lymph node measures 0.8 cm in maximal short axis diameter image 53. Retrocrural distal esophageal node measures 1.0 cm image 67. Heart size is mildly enlarged.  No pericardial effusion. The examination is adequate for evaluation for acute pulmonary embolism up to and including the 3rd order pulmonary arteries. No focal filling defect is identified up to and including the 3rd order pulmonary arteries to suggest acute pulmonary embolism. Lungs/Pleura: Minimal bibasilar dependent subpleural atelectasis. Bibasilar bronchiectasis is noted. Lingular volume loss. No measurable nodule, consolidation, or mass. Central airways are patent. Upper abdomen: Otherwise normal, please see comments above. Musculoskeletal: No acute osseous abnormality or lytic or sclerotic osseous lesion. Review of the MIP images confirms the above findings. IMPRESSION: No acute cardiopulmonary process. Mild bronchiectasis. Lingular volume loss and scarring or atelectasis. Mild esophageal distention by fluid and periesophageal/retrocrural lymphadenopathy compatible with the provided history  of esophageal carcinoma. Electronically Signed   By: Conchita Paris M.D.   On: 09/10/2015 19:41   Dg Chest Port 1 View  09/10/2015  CLINICAL DATA:  Dizziness. Currently receiving treatment for esophageal carcinoma EXAM: PORTABLE CHEST 1 VIEW COMPARISON:  07/20/2015 FINDINGS: Heart size upper normal. Postop CABG. AICD unchanged in position. Negative for heart failure Mild left lower lobe atelectasis has progressed in the interval. Negative for pneumonia or effusion. No mass or lung nodule. IMPRESSION: Mild left lower lobe atelectasis. Electronically Signed   By: Franchot Gallo M.D.   On: 09/10/2015 14:00    Microbiology: Recent Results (from the past 240 hour(s))  Blood Culture (routine x 2)     Status: None (Preliminary result)   Collection Time: 09/30/15 10:07 PM  Result Value Ref Range Status   Specimen Description BLOOD RAC  Final   Special Requests BOTTLES DRAWN AEROBIC AND ANAEROBIC 5ML  Final   Culture PENDING  Incomplete   Report Status PENDING  Incomplete     Labs: Basic Metabolic Panel:  Recent Labs Lab 09/30/15 2151 10/01/15 0245 10/02/15 0647  NA 132* 133* 134*  K 4.0 3.8 4.1  CL 99* 102 105  CO2 26 26 24   GLUCOSE 125* 109* 163*  BUN 16 15 14   CREATININE 1.28* 1.00 0.90  CALCIUM 8.7* 8.2* 8.8*  MG  --   --  2.2   Liver Function Tests:  Recent Labs Lab 09/30/15 2151 10/01/15 0245  AST 35 28  ALT 9* 9*  ALKPHOS 112 99  BILITOT 2.2* 2.3*  PROT 6.3* 5.9*  ALBUMIN 3.0* 2.9*    Recent Labs Lab 10/01/15 0245  LIPASE 35   CBC:  Recent Labs Lab 09/30/15 2151 10/01/15 0245 10/02/15 0647  WBC 4.3 3.5* 3.8*  NEUTROABS 2.4  --   --   HGB 9.0* 8.6* 9.1*  HCT 26.5* 25.4* 27.3*  MCV 84.1 83.6 83.7  PLT 94* 61* 83*   BNP: BNP (last 3 results)  Recent Labs  07/20/15 0512 09/10/15 1844 09/30/15 2156  BNP 217.8* 254.6* 704.3*   CBG:  Recent Labs Lab 10/01/15 0730 10/01/15 1129 10/01/15 1546 10/01/15 1945 10/02/15 0733  GLUCAP 112* 127*  213* 179* 141*    Signed:  Barton Dubois  [facility] 10/02/2015, 10:23 AM

## 2015-10-02 NOTE — Care Management Note (Signed)
Case Management Note  Patient Details  Name: LYNDEL SARATE MRN: [id number] Date of Birth: 1946/04/09  Subjective/Objective:        Sepsis failed outpt treatment            Action/Plan: home  Expected Discharge Date:                  Expected Discharge Plan:  Home/Self Care  In-House Referral:  NA  Discharge planning Services  CM Consult  Post Acute Care Choice:  NA Choice offered to:  NA  DME Arranged:    DME Agency:     HH Arranged:    HH Agency:     Status of Service:  Completed, signed off  Medicare Important Message Given:    Date Medicare IM Given:    Medicare IM give by:    Date Additional Medicare IM Given:    Additional Medicare Important Message give by:     If discussed at [place] of Stay Meetings, dates discussed:    Additional Comments:  Leeroy Cha, RN 10/02/2015, 10:54 AM

## 2015-10-02 NOTE — Progress Notes (Signed)
Dr. Dyann Kief has been page about pt's V-Tach. BP=144/99, HR=118, pt states he feels ok. ---Hassaan Crite-Lizmarie Witters, rn

## 2015-10-02 NOTE — Plan of Care (Signed)
Problem: Discharge Progression Outcomes Goal: Tolerating diet Outcome: Completed/Met Date Met:  10/02/15 Full liq diet

## 2015-10-02 NOTE — Progress Notes (Signed)
I resent for ondasetron req

## 2015-10-02 NOTE — Plan of Care (Signed)
Problem: Discharge Progression Outcomes Goal: Hemodynamically stable Outcome: Progressing Tach rate 130

## 2015-10-02 NOTE — Progress Notes (Signed)
Pharmacist Heart Failure Core Measure Documentation  Assessment: Walter French has an EF documented as 30-35% on 07/23/2015 by ECHO.  Rationale: Heart failure patients with left ventricular systolic dysfunction (LVSD) and an EF < 40% should be prescribed an angiotensin converting enzyme inhibitor (ACEI) or angiotensin receptor blocker (ARB) at discharge unless a contraindication is documented in the medical record.  This patient is not currently on an ACEI or ARB for HF.  This note is being placed in the record in order to provide documentation that a contraindication to the use of these agents is present for this encounter.  ACE Inhibitor or Angiotensin Receptor Blocker is contraindicated (specify all that apply)  []   ACEI allergy AND ARB allergy []   Angioedema []   Moderate or severe aortic stenosis []   Hyperkalemia []   Hypotension []   Renal artery stenosis [x]   Worsening renal function, preexisting renal disease or dysfunction (PMH states CKD III)  [name] 10/02/2015 12:08 PM

## 2015-10-03 LAB — URINE CULTURE: Culture: 30000

## 2015-10-05 ENCOUNTER — Telehealth: Payer: Self-pay | Admitting: Oncology

## 2015-10-05 ENCOUNTER — Inpatient Hospital Stay (HOSPITAL_COMMUNITY)
Admission: EM | Admit: 2015-10-05 | Discharge: 2015-10-10 | DRG: 309 | Disposition: A | Discharge status: Home / Self Care | Payer: Medicare Other | Attending: Cardiology | Admitting: Cardiology

## 2015-10-05 ENCOUNTER — Other Ambulatory Visit: Payer: Self-pay

## 2015-10-05 ENCOUNTER — Ambulatory Visit (HOSPITAL_BASED_OUTPATIENT_CLINIC_OR_DEPARTMENT_OTHER): Payer: Medicare Other | Admitting: Nurse Practitioner

## 2015-10-05 ENCOUNTER — Emergency Department (HOSPITAL_COMMUNITY)
Admission: EM | Admit: 2015-10-05 | Discharge: 2015-10-05 | Discharge status: Absent without leave | Payer: Medicare Other

## 2015-10-05 ENCOUNTER — Encounter (HOSPITAL_COMMUNITY): Payer: Self-pay | Admitting: Cardiology

## 2015-10-05 ENCOUNTER — Emergency Department (HOSPITAL_COMMUNITY): Payer: Medicare Other

## 2015-10-05 ENCOUNTER — Telehealth: Payer: Self-pay | Admitting: Nurse Practitioner

## 2015-10-05 ENCOUNTER — Telehealth: Payer: Self-pay | Admitting: Cardiology

## 2015-10-05 ENCOUNTER — Other Ambulatory Visit (HOSPITAL_BASED_OUTPATIENT_CLINIC_OR_DEPARTMENT_OTHER): Payer: Medicare Other

## 2015-10-05 VITALS — BP 111/70 | HR 137 | Temp 97.9°F | Resp 19 | Ht 72.0 in | Wt 227.2 lb

## 2015-10-05 DIAGNOSIS — I739 Peripheral vascular disease, unspecified: Secondary | ICD-10-CM | POA: Diagnosis not present

## 2015-10-05 DIAGNOSIS — Z7901 Long term (current) use of anticoagulants: Secondary | ICD-10-CM

## 2015-10-05 DIAGNOSIS — I2510 Atherosclerotic heart disease of native coronary artery without angina pectoris: Secondary | ICD-10-CM | POA: Diagnosis not present

## 2015-10-05 DIAGNOSIS — Z961 Presence of intraocular lens: Secondary | ICD-10-CM | POA: Diagnosis present

## 2015-10-05 DIAGNOSIS — C155 Malignant neoplasm of lower third of esophagus: Secondary | ICD-10-CM

## 2015-10-05 DIAGNOSIS — E785 Hyperlipidemia, unspecified: Secondary | ICD-10-CM | POA: Diagnosis present

## 2015-10-05 DIAGNOSIS — D638 Anemia in other chronic diseases classified elsewhere: Secondary | ICD-10-CM | POA: Diagnosis present

## 2015-10-05 DIAGNOSIS — Z7984 Long term (current) use of oral hypoglycemic drugs: Secondary | ICD-10-CM

## 2015-10-05 DIAGNOSIS — R531 Weakness: Secondary | ICD-10-CM | POA: Diagnosis not present

## 2015-10-05 DIAGNOSIS — I481 Persistent atrial fibrillation: Secondary | ICD-10-CM | POA: Diagnosis not present

## 2015-10-05 DIAGNOSIS — Z951 Presence of aortocoronary bypass graft: Secondary | ICD-10-CM

## 2015-10-05 DIAGNOSIS — Z9221 Personal history of antineoplastic chemotherapy: Secondary | ICD-10-CM

## 2015-10-05 DIAGNOSIS — K219 Gastro-esophageal reflux disease without esophagitis: Secondary | ICD-10-CM | POA: Diagnosis present

## 2015-10-05 DIAGNOSIS — C160 Malignant neoplasm of cardia: Secondary | ICD-10-CM

## 2015-10-05 DIAGNOSIS — E119 Type 2 diabetes mellitus without complications: Secondary | ICD-10-CM | POA: Diagnosis not present

## 2015-10-05 DIAGNOSIS — Z79899 Other long term (current) drug therapy: Secondary | ICD-10-CM

## 2015-10-05 DIAGNOSIS — Z794 Long term (current) use of insulin: Secondary | ICD-10-CM

## 2015-10-05 DIAGNOSIS — F419 Anxiety disorder, unspecified: Secondary | ICD-10-CM | POA: Diagnosis present

## 2015-10-05 DIAGNOSIS — I130 Hypertensive heart and chronic kidney disease with heart failure and stage 1 through stage 4 chronic kidney disease, or unspecified chronic kidney disease: Secondary | ICD-10-CM | POA: Diagnosis present

## 2015-10-05 DIAGNOSIS — I4891 Unspecified atrial fibrillation: Secondary | ICD-10-CM | POA: Diagnosis present

## 2015-10-05 DIAGNOSIS — C169 Malignant neoplasm of stomach, unspecified: Secondary | ICD-10-CM | POA: Diagnosis present

## 2015-10-05 DIAGNOSIS — M109 Gout, unspecified: Secondary | ICD-10-CM | POA: Diagnosis present

## 2015-10-05 DIAGNOSIS — Z9842 Cataract extraction status, left eye: Secondary | ICD-10-CM

## 2015-10-05 DIAGNOSIS — E1122 Type 2 diabetes mellitus with diabetic chronic kidney disease: Secondary | ICD-10-CM | POA: Diagnosis present

## 2015-10-05 DIAGNOSIS — E876 Hypokalemia: Secondary | ICD-10-CM | POA: Diagnosis not present

## 2015-10-05 DIAGNOSIS — I5043 Acute on chronic combined systolic (congestive) and diastolic (congestive) heart failure: Secondary | ICD-10-CM | POA: Diagnosis present

## 2015-10-05 DIAGNOSIS — R0602 Shortness of breath: Secondary | ICD-10-CM | POA: Diagnosis not present

## 2015-10-05 DIAGNOSIS — I5042 Chronic combined systolic (congestive) and diastolic (congestive) heart failure: Secondary | ICD-10-CM | POA: Diagnosis not present

## 2015-10-05 DIAGNOSIS — Z87891 Personal history of nicotine dependence: Secondary | ICD-10-CM

## 2015-10-05 DIAGNOSIS — Z923 Personal history of irradiation: Secondary | ICD-10-CM

## 2015-10-05 DIAGNOSIS — J849 Interstitial pulmonary disease, unspecified: Secondary | ICD-10-CM | POA: Diagnosis present

## 2015-10-05 DIAGNOSIS — I255 Ischemic cardiomyopathy: Secondary | ICD-10-CM | POA: Diagnosis present

## 2015-10-05 DIAGNOSIS — G4733 Obstructive sleep apnea (adult) (pediatric): Secondary | ICD-10-CM | POA: Diagnosis present

## 2015-10-05 DIAGNOSIS — Z95810 Presence of automatic (implantable) cardiac defibrillator: Secondary | ICD-10-CM | POA: Diagnosis present

## 2015-10-05 DIAGNOSIS — J449 Chronic obstructive pulmonary disease, unspecified: Secondary | ICD-10-CM | POA: Diagnosis present

## 2015-10-05 DIAGNOSIS — Z538 Procedure and treatment not carried out for other reasons: Secondary | ICD-10-CM | POA: Diagnosis present

## 2015-10-05 DIAGNOSIS — J45909 Unspecified asthma, uncomplicated: Secondary | ICD-10-CM | POA: Diagnosis present

## 2015-10-05 DIAGNOSIS — E1129 Type 2 diabetes mellitus with other diabetic kidney complication: Secondary | ICD-10-CM | POA: Diagnosis present

## 2015-10-05 DIAGNOSIS — N183 Chronic kidney disease, stage 3 (moderate): Secondary | ICD-10-CM | POA: Diagnosis present

## 2015-10-05 LAB — CBC
HCT: 29 % — ABNORMAL LOW (ref 39.0–52.0)
Hemoglobin: 9.7 g/dL — ABNORMAL LOW (ref 13.0–17.0)
MCH: 28.4 pg (ref 26.0–34.0)
MCHC: 33.4 g/dL (ref 30.0–36.0)
MCV: 84.8 fL (ref 78.0–100.0)
PLATELETS: 116 10*3/uL — AB (ref 150–400)
RBC: 3.42 MIL/uL — ABNORMAL LOW (ref 4.22–5.81)
RDW: 20.2 % — AB (ref 11.5–15.5)
WBC: 3.7 10*3/uL — AB (ref 4.0–10.5)

## 2015-10-05 LAB — CBC WITH DIFFERENTIAL/PLATELET
BASO%: 0.4 % (ref 0.0–2.0)
Basophils Absolute: 0 10*3/uL (ref 0.0–0.1)
EOS ABS: 0.1 10*3/uL (ref 0.0–0.5)
EOS%: 2.5 % (ref 0.0–7.0)
HCT: 28.4 % — ABNORMAL LOW (ref 38.4–49.9)
HGB: 9.4 g/dL — ABNORMAL LOW (ref 13.0–17.1)
LYMPH%: 35.9 % (ref 14.0–49.0)
MCH: 28.1 pg (ref 27.2–33.4)
MCHC: 33 g/dL (ref 32.0–36.0)
MCV: 85.1 fL (ref 79.3–98.0)
MONO#: 0.6 10*3/uL (ref 0.1–0.9)
MONO%: 14.5 % — AB (ref 0.0–14.0)
NEUT%: 46.7 % (ref 39.0–75.0)
NEUTROS ABS: 1.9 10*3/uL (ref 1.5–6.5)
Platelets: 95 10*3/uL — ABNORMAL LOW (ref 140–400)
RBC: 3.34 10*6/uL — AB (ref 4.20–5.82)
RDW: 20.8 % — ABNORMAL HIGH (ref 11.0–14.6)
WBC: 4.2 10*3/uL (ref 4.0–10.3)
lymph#: 1.5 10*3/uL (ref 0.9–3.3)

## 2015-10-05 LAB — BASIC METABOLIC PANEL (CC13)
Anion Gap: 7 meq/L (ref 3–11)
BUN: 8.5 mg/dL (ref 7.0–26.0)
CO2: 26 meq/L (ref 22–29)
Calcium: 8.9 mg/dL (ref 8.4–10.4)
Chloride: 104 meq/L (ref 98–109)
Creatinine: 0.8 mg/dL (ref 0.7–1.3)
EGFR: >90 ml/min/1.73 m2
Glucose: 56 mg/dL — ABNORMAL LOW (ref 70–140)
Potassium: 3.4 meq/L — ABNORMAL LOW (ref 3.5–5.1)
Sodium: 137 meq/L (ref 136–145)

## 2015-10-05 LAB — BASIC METABOLIC PANEL
ANION GAP: 7 (ref 5–15)
BUN: 7 mg/dL (ref 6–20)
CHLORIDE: 104 mmol/L (ref 101–111)
CO2: 25 mmol/L (ref 22–32)
Calcium: 8.5 mg/dL — ABNORMAL LOW (ref 8.9–10.3)
Creatinine, Ser: 0.85 mg/dL (ref 0.61–1.24)
GFR calc Af Amer: >60 mL/min (ref >60)
GFR calc non Af Amer: >60 mL/min (ref >60)
GLUCOSE: 78 mg/dL (ref 65–99)
POTASSIUM: 3.8 mmol/L (ref 3.5–5.1)
Sodium: 136 mmol/L (ref 135–145)

## 2015-10-05 LAB — I-STAT TROPONIN, ED: TROPONIN I, POC: 0 ng/mL (ref 0.00–0.08)

## 2015-10-05 LAB — TECHNOLOGIST REVIEW

## 2015-10-05 LAB — TROPONIN I

## 2015-10-05 MED ORDER — NITROGLYCERIN 0.4 MG SL SUBL: 0.4000 mg | SUBLINGUAL_TABLET | SUBLINGUAL | Status: DC | PRN

## 2015-10-05 MED ORDER — ACETAMINOPHEN 500 MG PO TABS: 1000.0000 mg | ORAL_TABLET | Freq: Four times a day (QID) | ORAL | Status: DC | PRN

## 2015-10-05 MED ORDER — ALLOPURINOL 300 MG PO TABS
300.0000 mg | ORAL_TABLET | Freq: Every day | ORAL | Status: DC
  Administered 2015-10-06 – 2015-10-10 (×5): 300 mg via ORAL
  Filled 2015-10-05 (×5): qty 1

## 2015-10-05 MED ORDER — LEVOTHYROXINE SODIUM 75 MCG PO TABS
75.0000 ug | ORAL_TABLET | Freq: Every day | ORAL | Status: DC
  Administered 2015-10-06 – 2015-10-10 (×4): 75 ug via ORAL
  Filled 2015-10-05 (×4): qty 1

## 2015-10-05 MED ORDER — ONDANSETRON HCL 4 MG/2ML IJ SOLN
4.0000 mg | Freq: Four times a day (QID) | INTRAMUSCULAR | Status: DC | PRN
  Administered 2015-10-09: 4 mg via INTRAVENOUS
  Filled 2015-10-05: qty 2

## 2015-10-05 MED ORDER — SODIUM CHLORIDE 0.9 % IV SOLN: 250.0000 mL | INTRAVENOUS | Status: DC

## 2015-10-05 MED ORDER — SODIUM CHLORIDE 0.9 % IV BOLUS (SEPSIS)
500.0000 mL | Freq: Once | INTRAVENOUS | Status: AC
  Administered 2015-10-05: 500 mL via INTRAVENOUS

## 2015-10-05 MED ORDER — SACCHAROMYCES BOULARDII 250 MG PO CAPS
250.0000 mg | ORAL_CAPSULE | Freq: Two times a day (BID) | ORAL | Status: DC
  Administered 2015-10-05 – 2015-10-09 (×8): 250 mg via ORAL
  Filled 2015-10-05 (×8): qty 1

## 2015-10-05 MED ORDER — METOPROLOL TARTRATE 50 MG PO TABS
50.0000 mg | ORAL_TABLET | Freq: Three times a day (TID) | ORAL | Status: DC
  Administered 2015-10-05 – 2015-10-10 (×13): 50 mg via ORAL
  Filled 2015-10-05 (×14): qty 1

## 2015-10-05 MED ORDER — SODIUM CHLORIDE 0.9 % IV SOLN
INTRAVENOUS | Status: DC
  Administered 2015-10-05: 20 mL/h via INTRAVENOUS
  Administered 2015-10-06: 09:00:00 via INTRAVENOUS

## 2015-10-05 MED ORDER — SODIUM CHLORIDE 0.9 % IJ SOLN
3.0000 mL | Freq: Two times a day (BID) | INTRAMUSCULAR | Status: DC
  Administered 2015-10-05 – 2015-10-09 (×8): 3 mL via INTRAVENOUS

## 2015-10-05 MED ORDER — SODIUM CHLORIDE 0.9 % IJ SOLN: 3.0000 mL | INTRAMUSCULAR | Status: DC | PRN

## 2015-10-05 MED ORDER — GUAIFENESIN ER 600 MG PO TB12
600.0000 mg | ORAL_TABLET | Freq: Two times a day (BID) | ORAL | Status: DC
Start: 2015-10-05 — End: 2015-10-10
  Administered 2015-10-05 – 2015-10-10 (×10): 600 mg via ORAL
  Filled 2015-10-05 (×10): qty 1

## 2015-10-05 MED ORDER — PROCHLORPERAZINE MALEATE 10 MG PO TABS
10.0000 mg | ORAL_TABLET | Freq: Four times a day (QID) | ORAL | Status: DC | PRN
  Filled 2015-10-05: qty 1

## 2015-10-05 MED ORDER — LABETALOL HCL 5 MG/ML IV SOLN
20.0000 mg | Freq: Once | INTRAVENOUS | Status: AC
Start: 2015-10-05 — End: 2015-10-05
  Administered 2015-10-05: 20 mg via INTRAVENOUS
  Filled 2015-10-05: qty 4

## 2015-10-05 MED ORDER — LABETALOL HCL 5 MG/ML IV SOLN
20.0000 mg | Freq: Once | INTRAVENOUS | Status: AC
  Administered 2015-10-05: 20 mg via INTRAVENOUS
  Filled 2015-10-05: qty 4

## 2015-10-05 MED ORDER — APIXABAN 5 MG PO TABS
5.0000 mg | ORAL_TABLET | Freq: Two times a day (BID) | ORAL | Status: DC
  Administered 2015-10-05 – 2015-10-10 (×10): 5 mg via ORAL
  Filled 2015-10-05 (×10): qty 1

## 2015-10-05 MED ORDER — DIGOXIN 250 MCG PO TABS
0.2500 mg | ORAL_TABLET | Freq: Every day | ORAL | Status: DC
Start: 2015-10-05 — End: 2015-10-10
  Administered 2015-10-05 – 2015-10-10 (×6): 0.25 mg via ORAL
  Filled 2015-10-05 (×7): qty 1

## 2015-10-05 MED ORDER — ATORVASTATIN CALCIUM 40 MG PO TABS
40.0000 mg | ORAL_TABLET | Freq: Every day | ORAL | Status: DC
  Administered 2015-10-06 – 2015-10-10 (×5): 40 mg via ORAL
  Filled 2015-10-05 (×5): qty 1

## 2015-10-05 NOTE — Telephone Encounter (Signed)
As per Owens Shark NP call placed to Peter Martinique MD. Office to inquire if Mr. Heider can be seen today. As per Owens Shark NP Mr. Yeates is in Rapid Afib 130-140's Spoke with Ovid Curd the triage nurse at the office he stated the pt. Should go to the ER protocol for the office is any Heart rate over 120 should go to the ER. Explained this information to Mr. Holness and his son . They both verbalized understanding. Stated on their way to [facility].

## 2015-10-05 NOTE — ED Notes (Signed)
Cards at bedside

## 2015-10-05 NOTE — Progress Notes (Addendum)
  [place] OFFICE PROGRESS NOTE   Diagnosis:   GE junction carcinoma  INTERVAL HISTORY:    Walter French returns as scheduled. He completed the final Taxol/carboplatin 09/22/2015. He completed radiation 09/24/2015. He was hospitalized 09/30/2015 through 10/02/2015 with probable UTI /bronchitis.  He feels weak. He thinks he is "back in A. Fib". He is not sure if he has been taking Toprol-XL as prescribed. He thinks it may have been discontinued. He has stable dyspnea. No chest pain. He is no longer having abdominal pain. He denies nausea/vomiting. No dysphagia. No fever.  Objective:  Vital signs in last 24 hours:  Blood pressure 111/70, pulse 137, temperature 97.9 F (36.6 C), temperature source Oral, resp. rate 19, height 6' (1.829 m), weight 227 lb 3.2 oz (103.057 kg), SpO2 98 %.    HEENT:  No thrush or ulcers. Resp:  Rales at both lung bases. Rhonchi upper lung fields. No respiratory distress. Cardio:  Irregular, rapid. GI:  No hepatomegaly. Vascular:  No leg edema. Neuro:  Alert and oriented.   Lab Results:  Lab Results  Component Value Date   WBC 4.2 10/05/2015   HGB 9.4* 10/05/2015   HCT 28.4* 10/05/2015   MCV 85.1 10/05/2015   PLT 95* 10/05/2015   NEUTROABS 1.9 10/05/2015    Imaging:  No results found.  Medications: I have reviewed the patient's current medications.  Assessment/Plan: 1. Adenocarcinoma the gastroesophageal junction, status post an endoscopic biopsy of a distal esophagus mass and gastric cardia ulcer on 07/23/2015  Staging CT scans consistent with locally metastatic adenopathy  PET scan 08/07/2015 with hypermetabolic mass at the distal esophagus/GE junction and a hypermetabolic paraesophageal, gastrohepatic, and portacaval lymph nodes  Initiation of radiation 08/12/2015 with completion 09/24/2015; cycle 1 Taxol/carboplatin 08/13/2015; Final cycle given 09/22/2015.  2. History of coronary artery disease 3. Congestive heart  failure 4. History of ventricular tachycardia and supraventricular tachycardia 5. Peripheral arterial disease 6. Implantable defibrillator 7. Diabetes 8. Gout 9. Hospitalization from 09/10/15 to 09/15/15 due to Acute Hypoxic Respiratory Failure and Sepsis as well as new onset of Atrial Fibrillation, 10. Hospitalization 09/30/2015 through 10/02/2015 with probable UTI/bronchitis.   Disposition: Walter French has completed the planned course of concurrent radiation/chemotherapy. The dysphagia and abdominal pain he presented with have resolved. The plan is to follow with observation with a possible follow-up upper endoscopy in the future.  He appears to be in rapid atrial fibrillation. We contacted Dr. Doug Sou office and were directed to send him to the emergency department.  He will return for a follow-up visit in our office in 2-3 weeks.  Patient seen with Dr. Benay Spice.    Ned Card ANP/GNP-BC   10/05/2015  11:59 AM  Walter French was interviewed and examined. He has completed treatment for esophagus cancer. He has persistent failure to thrive. He appears to have rapid atrial fibrillation today. We will refer him to cardiology for further evaluation.  Julieanne Manson, M.D.

## 2015-10-05 NOTE — ED Notes (Signed)
Pt HR has gotten as high as 140. Pt remains asymptomatic. MD Peter Martinique aware and sts to send the patient to [facility]. 3E RN Joni Reining made aware of situation. Dr Martinique sts "He hasn't been controlled for a while, our plan is to get him back on the metoprolol and cardiovert in the morning."

## 2015-10-05 NOTE — ED Notes (Signed)
Reports he was at the [facility] and sent here because he was noted to be in a-fib. Denies any chest pain or SOB but reports his heart has been racing all weekend.

## 2015-10-05 NOTE — Telephone Encounter (Signed)
Call also placed to Charge Nurse at [facility] ER Olivia Mackie) To inform her that pt. Is on his way to Emergency Room. She verbalized understanding.

## 2015-10-05 NOTE — H&P (Signed)
Patient ID: Walter French MRN: [id number], DOB/AGE: 67/27/1947   Admit date: 10/05/2015   Primary Physician: Karlene Einstein, MD Primary Cardiologist: Dr. Martinique Primary Electrophysiologist: Dr. Caryl Comes  Pt. Profile:  69 yo male with PMH of CAD s/p CABG x 3 (LIMA to LAD, SVG to PDA, SVG to PL), ICM with EF 30-35%, h/o VT s/p MDT ICD, chronic combined systolic and diastolic HF, PAD, HTN, HLD, DM, OSA and gastroesophageal cancer s/p chemo and radiation therapy presented with afib with RVR  Problem List  Past Medical History  Diagnosis Date  . Hyperlipidemia   . Hypertension   . Coronary heart disease     a. s/p CABG x 3 (VG->PDA, VG->PL, LIMA->LAD);  b. 2013 Abnl Myoview;  c. 2013 Cath: 3/3 patent grafts, occluded LCX which correlated w/ ischemia on MV->not amenable to PCI->Med Rx.  . Obesity   . CHF (congestive heart failure) ([place])   . VT (ventricular tachycardia) ([place])   . SVT (supraventricular tachycardia) ([place])   . PAD (peripheral artery disease) ([place])     Angiography in February of 2014: Moderate left iliac artery stenosis, mild to moderate diffuse right SFA disease. Severe proximal right popliteal artery stenosis with three-vessel runoff below the knee. Status post self-expanding stent placement to the proximal popliteal artery  . Implantable cardioverter-defibrillator Mdt   . Asthma     "small touch" (07/29/2013)  . History of pneumonia     "3-4 times; last time ~ 2003" (07/29/2013)  . OSA on CPAP   . GERD (gastroesophageal reflux disease)   . H/O hiatal hernia   . Gout   . Anxiety   . Allergy   . Stomach cancer ([place]) 07/23/2015    invasive adenocarcinoma ,esophagus ,distal   . Type II diabetes mellitus ([place])     on insulin  . Sleep apnea   . Hyperlipidemia   . PAD (peripheral artery disease) ([place])   . CHF (congestive heart failure) ([place])   . A-fib ([place])   . CKD (chronic kidney disease), stage III     Past Surgical History  Procedure Laterality Date  .  Tonsillectomy    . Cardiac catheterization    . Wrist fracture surgery Right   . Cataract extraction w/ intraocular lens implant Left   . Cardiac defibrillator placement  1997; ~ 2003  . Coronary artery bypass graft  1997; 2007    "X3" (07/29/2013)  . Nerve, tendon and artery repair Left 09/20/2013    Procedure: IRRIGATION AND DEBRIDEMENT,Exploration and Repair of Ulnar/Digital  Artery Nerve of Left Index finger;  Surgeon: Roseanne Kaufman, MD;  Location: [place];  Service: Orthopedics;  Laterality: Left;  . Abdominal aortagram N/A 01/30/2013    Procedure: ABDOMINAL Maxcine Ham;  Surgeon: Wellington Hampshire, MD;  Location: [facility] CATH LAB;  Service: Cardiovascular;  Laterality: N/A;  . Cardiac catheterization N/A 07/22/2015    Procedure: Left Heart Cath and Coronary Angiography;  Surgeon: Peter M Martinique, MD;  Location: [place] CV LAB;  Service: Cardiovascular;  Laterality: N/A;  . Esophagogastroduodenoscopy N/A 07/23/2015    Procedure: ESOPHAGOGASTRODUODENOSCOPY (EGD);  Surgeon: Wonda Horner, MD;  Location: [facility] ENDOSCOPY;  Service: Endoscopy;  Laterality: N/A;     Allergies  Allergies  Allergen Reactions  . Levaquin [Levofloxacin In D5w] Nausea And Vomiting  . Adhesive [Tape]     Pulls skin off  . Morphine And Related Nausea And Vomiting    HPI  69 yo male with PMH of CAD s/p CABG x 3 (  LIMA to LAD, SVG to PDA, SVG to PL), ICM with EF 30-35%, h/o VT s/p MDT ICD, chronic combined systolic and diastolic HF, PAD, HTN, HLD, DM, OSA and 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 His last echocardiogram obtained on 07/23/2015 showed EF 27-25%, grade 2 diastolic dysfunction, PA peak pressure 34. He was admitted from 9/30-10/4 when he presented with weakness, dyspnea and hypoxia and was noted to be in new onset of atrial fibrillation with RVR. He was started on Eliquis on 10/2. Rate control with metoprolol has been limited by his blood pressure. He was also treated for sepsis.  It appears  patient was discharged in atrial fibrillation with goal rate control on metoprolol 50mg  TID and digoxin. He was last admitted with sepsis 2/2 bronchitis and UTI on 10/19-10/21. During the recent admission, his metoprolol 3 times a day dose was consolidated to 100 mg Toprol-XL on discharge which the patient has not been taking. According to the patient, he was told not to take Toprol-XL due to blood pressure issues.  Since discharge, he has been doing well, he denies any fever, chill. He does have chronic dry cough. She denies any significant palpitation or cardiac awareness of atrial fibrillation. Unfortunately, patient is a poor historian, and does not know if he has been taking Eliquis. He was seen by his oncologist in the morning of 10/24 when noted patient was in atrial fibrillation with RVR. He also noted he was more short of breath this morning with exertion, it took him 2 hours to get dressed. He was sent to [facility] for further workup. He was given labetalol, after which the heart rate improved to 110 to 120s. Cardiology has been consulted for atrial fibrillation with RVR. Of note, after labetalol, patient's blood pressure briefly dipped down to 80s, however has since improved to low 100s.  Home Medications  Prior to Admission medications   Medication Sig Start Date End Date Taking? Authorizing Provider  acetaminophen (TYLENOL) 500 MG tablet Take 1,000 mg by mouth every 6 (six) hours as needed for moderate pain or headache.   Yes Historical Provider, MD  allopurinol (ZYLOPRIM) 300 MG tablet Take 300 mg by mouth daily.     Yes Historical Provider, MD  atorvastatin (LIPITOR) 40 MG tablet Take 1 tablet (40 mg total) by mouth daily. 03/11/15  Yes Deboraha Sprang, MD  glipiZIDE (GLUCOTROL) 10 MG tablet Take 10 mg by mouth 2 (two) times daily before a meal.  09/18/12  Yes Historical Provider, MD  guaiFENesin (MUCINEX) 600 MG 12 hr tablet Take 1 tablet (600 mg total) by mouth 2 (two) times  daily. 10/02/15  Yes Barton Dubois, MD  insulin aspart (NOVOLOG FLEXPEN) 100 UNIT/ML FlexPen  Use the following scale to administer insulin depending on CBG              CBG 121 - 150:  2  units    CBG 151 - 200: 3 units    CBG 201 - 250: 5 units    CBG 251 - 300: 8 units    CBG 301 - 350: 11 units    CBG 351 - 400: 15 units 09/15/15  Yes Robbie Lis, MD  levothyroxine (SYNTHROID, LEVOTHROID) 75 MCG tablet Take 75 mcg by mouth daily before breakfast.  12/06/14  Yes Historical Provider, MD  prochlorperazine (COMPAZINE) 10 MG tablet Take 1 tablet (10 mg total) by mouth every 6 (six) hours as needed for nausea or vomiting. 09/03/15  Yes Lattie Haw  Verlene Mayer, NP  ranitidine (ZANTAC) 300 MG tablet Take 1 tablet (300 mg total) by mouth daily as needed for heartburn. 02/16/15  Yes Peter M Martinique, MD  saccharomyces boulardii (FLORASTOR) 250 MG capsule Take 1 capsule (250 mg total) by mouth 2 (two) times daily. 10/02/15  Yes Barton Dubois, MD  apixaban (ELIQUIS) 5 MG TABS tablet Take 1 tablet (5 mg total) by mouth 2 (two) times daily. Patient not taking: Reported on 10/05/2015 09/15/15   Robbie Lis, MD  dexamethasone (DECADRON) 4 MG tablet Take 10 mg at 10pm..... night before chemotherapy Patient not taking: Reported on 09/16/2015 08/06/15   Ladell Pier, MD  digoxin (LANOXIN) 0.25 MG tablet Take 1 tablet (0.25 mg total) by mouth daily. Patient not taking: Reported on 10/05/2015 09/15/15   Robbie Lis, MD  hyaluronate sodium (RADIAPLEXRX) GEL Apply 1 application topically as needed (treatment).     Historical Provider, MD  HYDROcodone-acetaminophen (NORCO/VICODIN) 5-325 MG per tablet TAKE 1 TABLET BY MOUTH 4 TIMES DAILY AS NEEDED FOR PAIN Patient not taking: Reported on 09/30/2015 09/03/15   Owens Shark, NP  levofloxacin (LEVAQUIN) 750 MG tablet Take 1 tablet (750 mg total) by mouth daily. Patient not taking: Reported on 10/05/2015 10/02/15   Barton Dubois, MD  Liraglutide (VICTOZA) 18 MG/3ML  SOPN Inject 1.8 mg into the skin daily.    Historical Provider, MD  metoprolol succinate (TOPROL-XL) 100 MG 24 hr tablet Take 1 tablet by mouth daily. 09/02/15   Historical Provider, MD  nitroGLYCERIN (NITROSTAT) 0.4 MG SL tablet Place 1 tablet (0.4 mg total) under the tongue every 5 (five) minutes x 3 doses as needed for chest pain. 10/03/14   Peter M Martinique, MD  ondansetron (ZOFRAN) 8 MG tablet Take 1 tablet (8 mg total) by mouth every 8 (eight) hours as needed for nausea or vomiting. Patient not taking: Reported on 10/05/2015 09/21/15   Rondel Jumbo, PA-C  sucralfate (CARAFATE) 1 G tablet Take 1 tablet (1 g total) by mouth 4 (four) times daily. Patient not taking: Reported on 10/05/2015 08/21/15   Kyung Rudd, MD  traMADol (ULTRAM) 50 MG tablet Take 1 tablet (50 mg total) by mouth every 6 (six) hours as needed for moderate pain. Patient not taking: Reported on 10/05/2015 07/30/15   Ladell Pier, MD    Family History  Family History  Problem Relation Age of Onset  . Heart disease Brother   . Cancer Brother   . Breast cancer Mother     Social History  Social History   Social History  . Marital Status: Single    Spouse Name: N/A  . Number of Children: 0  . Years of Education: N/A   Occupational History  . retired Surveyor, mining   .     Social History Main Topics  . Smoking status: Former Smoker -- 2.00 packs/day for 30 years    Types: Cigarettes    Quit date: 12/13/1995  . Smokeless tobacco: Never Used  . Alcohol Use: No     Comment: 07/29/2013 "not touched any alcohol since 1997"  . Drug Use: No  . Sexual Activity: Not Currently   Other Topics Concern  . Not on file   Social History Narrative   Single, never married   Retired Administrator   Close friend, [name] and her children are his main support system   Independent in ADLs.     Review of Systems General:  No chills, fever, night sweats or  weight changes.  Cardiovascular:  No chest pain, edema,  orthopnea, palpitations, paroxysmal nocturnal dyspnea. +dyspnea on exertion Dermatological: No rash, lesions/masses Respiratory: No cough, dyspnea Urologic: No hematuria, dysuria Abdominal:   No nausea, vomiting, diarrhea, bright red blood per rectum, melena, or hematemesis Neurologic:  No visual changes, wkns, changes in mental status. All other systems reviewed and are otherwise negative except as noted above.  Physical Exam  Blood pressure 105/61, pulse 140, temperature 97.5 F (36.4 C), temperature source Oral, resp. rate 35, weight 227 lb (102.967 kg), SpO2 97 %.  General: Pleasant, NAD Psych: Normal affect. Neuro: Alert and oriented X 3. Moves all extremities spontaneously. HEENT: Normal  Neck: Supple without bruits or JVD. Lungs:  Resp regular and unlabored, CTA Heart: tachycardia. no s3, s4, or murmurs. Abdomen: Soft, non-tender, non-distended, BS + x 4.  Extremities: No clubbing, cyanosis. DP/PT/Radials 2+ and equal bilaterally. 1+ RLE pitting edema, no edema on the L side.  Labs  Troponin [facility] of Care Test)  Recent Labs  10/05/15 1421  TROPIPOC 0.00   No results for input(s): CKTOTAL, CKMB, TROPONINI in the last 72 hours. Lab Results  Component Value Date   WBC 3.7* 10/05/2015   HGB 9.7* 10/05/2015   HCT 29.0* 10/05/2015   MCV 84.8 10/05/2015   PLT 116* 10/05/2015     Recent Labs Lab 10/01/15 0245 10/02/15 0647 10/05/15 1118  NA 133* 134* 137  K 3.8 4.1 3.4*  CL 102 105  --   CO2 26 24 26   BUN 15 14 8.5  CREATININE 1.00 0.90 0.8  CALCIUM 8.2* 8.8* 8.9  PROT 5.9*  --   --   BILITOT 2.3*  --   --   ALKPHOS 99  --   --   ALT 9*  --   --   AST 28  --   --   GLUCOSE 109* 163* 56*   Lab Results  Component Value Date   CHOL 138 07/30/2013   HDL 25* 07/30/2013   LDLCALC 49 07/30/2013   TRIG 321* 07/30/2013   Lab Results  Component Value Date   DDIMER * 08/21/2007    0.69        AT THE INHOUSE ESTABLISHED CUTOFF VALUE OF 0.48 ug/mL FEU, THIS  ASSAY HAS BEEN DOCUMENTED IN THE LITERATURE TO HAVE     Radiology/Studies  Dg Chest 2 View  09/30/2015  CLINICAL DATA:  Shortness of breath and cough for 3 days, generalized weakness EXAM: CHEST - 2 VIEW COMPARISON:  9/29/616 FINDINGS: Cardiac shadow is enlarged. A defibrillator is again seen. Postsurgical changes are noted. Lingular atelectatic changes are again seen. No sizable effusion is noted. No acute bony abnormality is seen. IMPRESSION: Stable changes in the lingula.  No acute abnormality noted. Electronically Signed   By: Inez Catalina M.D.   On: 09/30/2015 22:12   Ct Angio Chest Pe W/cm &/or Wo Cm  09/10/2015  CLINICAL DATA:  Presyncope, esophageal carcinoma EXAM: CT ANGIOGRAPHY CHEST WITH CONTRAST TECHNIQUE: Multidetector CT imaging of the chest was performed using the standard protocol during bolus administration of intravenous contrast. Multiplanar CT image reconstructions and MIPs were obtained to evaluate the vascular anatomy. CONTRAST:  182mL OMNIPAQUE IOHEXOL 350 MG/ML SOLN COMPARISON:  Chest radiograph same date, PET-CT 08/07/2015 FINDINGS: Mediastinum/Nodes: Left-sided AICD in place. Evidence of CABG. Great vessels are normal in caliber. Moderate atheromatous coronary arterial and aortic calcification. Mild esophageal distention is identified. The previously evaluated mass of the distal esophagus is not specifically re-  visualized without oral contrast today. Periesophageal lymph node measures 0.8 cm in maximal short axis diameter image 53. Retrocrural distal esophageal node measures 1.0 cm image 67. Heart size is mildly enlarged.  No pericardial effusion. The examination is adequate for evaluation for acute pulmonary embolism up to and including the 3rd order pulmonary arteries. No focal filling defect is identified up to and including the 3rd order pulmonary arteries to suggest acute pulmonary embolism. Lungs/Pleura: Minimal bibasilar dependent subpleural atelectasis. Bibasilar  bronchiectasis is noted. Lingular volume loss. No measurable nodule, consolidation, or mass. Central airways are patent. Upper abdomen: Otherwise normal, please see comments above. Musculoskeletal: No acute osseous abnormality or lytic or sclerotic osseous lesion. Review of the MIP images confirms the above findings. IMPRESSION: No acute cardiopulmonary process. Mild bronchiectasis. Lingular volume loss and scarring or atelectasis. Mild esophageal distention by fluid and periesophageal/retrocrural lymphadenopathy compatible with the provided history of esophageal carcinoma. Electronically Signed   By: Conchita Paris M.D.   On: 09/10/2015 19:41   Dg Chest Portable 1 View  10/05/2015  CLINICAL DATA:  Atrial fibrillation. EXAM: PORTABLE CHEST 1 VIEW COMPARISON:  09/30/2015. FINDINGS: Stable enlarged cardiac silhouette and post CABG changes. The pulmonary vasculature and interstitial markings remain mildly prominent. Persistent ill-defined density at the left lung base. Stable left subclavian AICD lead. Unremarkable bones. IMPRESSION: 1. Stable mild left basilar atelectasis or scarring. 2. Stable mild cardiomegaly, mild pulmonary vascular congestion and mild chronic interstitial lung disease. Electronically Signed   By: Claudie Revering M.D.   On: 10/05/2015 14:19   Dg Chest Port 1 View  09/10/2015  CLINICAL DATA:  Dizziness. Currently receiving treatment for esophageal carcinoma EXAM: PORTABLE CHEST 1 VIEW COMPARISON:  07/20/2015 FINDINGS: Heart size upper normal. Postop CABG. AICD unchanged in position. Negative for heart failure Mild left lower lobe atelectasis has progressed in the interval. Negative for pneumonia or effusion. No mass or lung nodule. IMPRESSION: Mild left lower lobe atelectasis. Electronically Signed   By: Franchot Gallo M.D.   On: 09/10/2015 14:00    ECG  afib with RVR with HR 140s  Echocardiogram 07/23/2015  LV EF: 30% -   35%  ------------------------------------------------------------------- Indications:   CAD of native vessels 414.01.  ------------------------------------------------------------------- History:  PMH:  Chest pain. Congestive heart failure. Chronic obstructive pulmonary disease. Risk factors: Hypertension. Dyslipidemia.  ------------------------------------------------------------------- Study Conclusions  - Left ventricle: The cavity size was normal. Wall thickness was increased in a pattern of moderate LVH. There was focal basal hypertrophy. Systolic function was moderately to severely reduced. The estimated ejection fraction was in the range of 30% to 35%. Features are consistent with a pseudonormal left ventricular filling pattern, with concomitant abnormal relaxation and increased filling pressure (grade 2 diastolic dysfunction). - Pulmonary arteries: Systolic pressure was mildly increased. PA peak pressure: 34 mm Hg (S).    ASSESSMENT AND PLAN  1. Persistent atrial fibrillation with RVR on eliquis  - CHA2DS2-Vasc score 5 (HF, age, CAD, HTN, DM)  - does not appears to have been taking metoprolol XL at home. Not even sure if he's taking eliquis  - will need to restart BB once BP improve, given hypotension, will start 50mg  TID metoprolol which has worked for him in the past  - plan for TEE DCCV tomorrow given uncertainty whether he has been taking eliquis  2. Gastroesophageal CA s/p chemo 09/22/2015 and radiation 09/24/2015  3. CAD s/p CABG x 3 (LIMA to LAD, SVG to PDA, SVG to PL)  - no angina sx  4.  ICM with EF 30-35%  5. h/o VT s/p MDT ICD  6. chronic combined systolic and diastolic HF: euvolemic on exam  7. PAD 8. HTN 9. HLD 10. DM 11. OSA     Signed, Almyra Deforest, [place] 10/05/2015, 4:28 PM Patient seen and examined and history reviewed. Agree with above findings and plan. Patient presents to ED with AFib with RVR. He is symptomatic with  significant increase in dyspnea and fatigue without over heart failure. He presented in AFib at the end of September. Since then rate has been poorly controlled and medications limited by low BP. He reports he has been taking medications but doesn't really know what they are for. Recently diagnosed with gastric/esophageal CA in August. Receiving chemo and RT. He has a longstanding history of ischemic cardiomyopathy but cath in August showed no new disease. I think the best course at this point would be to do a TEE guided DC cardioversion to re-establish NSR. Will continue Eliquis. Resume metoprolol 50 mg tid. If he is not able to maintain NSR would recommend antiarrhythmic therapy with amiodarone.   Peter Martinique, [place] 10/05/2015 4:41 PM

## 2015-10-05 NOTE — Telephone Encounter (Signed)
Pt at Cancer center - staff called and wanted to know if pt could be added on - Pt in rapid A Fib (rate of 137 last checked). Symptomatic SOB, fatigue.  Advised ER w/ rapid rate over 120 - likely would need IV meds for rate control - caller voiced understanding.  He is at Cancer center at [facility] - advised fine to see in ER there. Sent for evaluation.  Notified Agricultural consultant for [employer] ED.

## 2015-10-05 NOTE — ED Provider Notes (Signed)
CSN: [id number]     Arrival date & time 10/05/15  1340 History   First MD Initiated Contact with Patient 10/05/15 1358     Chief Complaint  Patient presents with  . Atrial Fibrillation     (Consider location/radiation/quality/duration/timing/severity/associated sxs/prior Treatment) HPI patient presents with concern of palpitations. Patient has no pain, dyspnea, lightheadedness, nausea, vomiting, fever, chills. Palpitations began maybe 2 days ago, since onset has been persistent. Patient is taking all medication as directed. Has  h notableistory of congestive heart failure, atrial fibrillation, is currently taking beta blocker and Eliquis Patient also has history of gastric malignancy, and is now 3 weeks status post his last chemotherapy session.     Past Medical History  Diagnosis Date  . Hyperlipidemia   . Hypertension   . Coronary heart disease     a. s/p CABG x 3 (VG->PDA, VG->PL, LIMA->LAD);  b. 2013 Abnl Myoview;  c. 2013 Cath: 3/3 patent grafts, occluded LCX which correlated w/ ischemia on MV->not amenable to PCI->Med Rx.  . Obesity   . CHF (congestive heart failure) ([place])   . VT (ventricular tachycardia) ([place])   . SVT (supraventricular tachycardia) ([place])   . PAD (peripheral artery disease) ([place])     Angiography in February of 2014: Moderate left iliac artery stenosis, mild to moderate diffuse right SFA disease. Severe proximal right popliteal artery stenosis with three-vessel runoff below the knee. Status post self-expanding stent placement to the proximal popliteal artery  . Implantable cardioverter-defibrillator Mdt   . Asthma     "small touch" (07/29/2013)  . History of pneumonia     "3-4 times; last time ~ 2003" (07/29/2013)  . OSA on CPAP   . GERD (gastroesophageal reflux disease)   . H/O hiatal hernia   . Gout   . Anxiety   . Allergy   . 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 Type II diabetes mellitus ([place])     on  insulin  . Sleep apnea   . Hyperlipidemia   . PAD (peripheral artery disease) ([place])   . CHF (congestive heart failure) ([place])   . A-fib ([place])   . CKD (chronic kidney disease), stage III    Past Surgical History  Procedure Laterality Date  . Tonsillectomy    . Cardiac catheterization    . Wrist fracture surgery Right   . Cataract extraction w/ intraocular lens implant Left   . Cardiac defibrillator placement  1997; ~ 2003  . Coronary artery bypass graft  1997; 2007    "X3" (07/29/2013)  . Nerve, tendon and artery repair Left 09/20/2013    Procedure: IRRIGATION AND DEBRIDEMENT,Exploration and Repair of Ulnar/Digital  Artery Nerve of Left Index finger;  Surgeon: Roseanne Kaufman, MD;  Location: [facility];  Service: Orthopedics;  Laterality: Left;  . Abdominal aortagram N/A 01/30/2013    Procedure: ABDOMINAL Maxcine Ham;  Surgeon: Wellington Hampshire, MD;  Location: [facility] CATH LAB;  Service: Cardiovascular;  Laterality: N/A;  . Cardiac catheterization N/A 07/22/2015    Procedure: Left Heart Cath and Coronary Angiography;  Surgeon: Peter M Martinique, MD;  Location: [facility] CV LAB;  Service: Cardiovascular;  Laterality: N/A;  . Esophagogastroduodenoscopy N/A 07/23/2015    Procedure: ESOPHAGOGASTRODUODENOSCOPY (EGD);  Surgeon: Wonda Horner, MD;  Location: [facility] ENDOSCOPY;  Service: Endoscopy;  Laterality: N/A;   Family History  Problem Relation Age of Onset  . Heart disease Brother   . Cancer Brother   . Breast cancer Mother  Social History  Substance Use Topics  . Smoking status: Former Smoker -- 2.00 packs/day for 30 years    Types: Cigarettes    Quit date: 12/13/1995  . Smokeless tobacco: Never Used  . Alcohol Use: No     Comment: 07/29/2013 "not touched any alcohol since 1997"    Review of Systems  Constitutional:       Per HPI, otherwise negative  HENT:       Per HPI, otherwise negative  Respiratory:       Per HPI, otherwise negative  Cardiovascular:       Per HPI, otherwise negative   Gastrointestinal: Negative for vomiting.  Endocrine:       Negative aside from HPI  Genitourinary:       Neg aside from HPI   Musculoskeletal:       Per HPI, otherwise negative  Skin: Negative.   Allergic/Immunologic: Positive for immunocompromised state.  Neurological: Negative for syncope.      Allergies  Levaquin; Adhesive; and Morphine and related  Home Medications   Prior to Admission medications   Medication Sig Start Date End Date Taking? Authorizing Provider  acetaminophen (TYLENOL) 500 MG tablet Take 1,000 mg by mouth every 6 (six) hours as needed for moderate pain or headache.    Historical Provider, MD  allopurinol (ZYLOPRIM) 300 MG tablet Take 300 mg by mouth daily.      Historical Provider, MD  apixaban (ELIQUIS) 5 MG TABS tablet Take 1 tablet (5 mg total) by mouth 2 (two) times daily. 09/15/15   Robbie Lis, MD  atorvastatin (LIPITOR) 40 MG tablet Take 1 tablet (40 mg total) by mouth daily. 03/11/15   Deboraha Sprang, MD  dexamethasone (DECADRON) 4 MG tablet Take 10 mg at 10pm..... night before chemotherapy Patient not taking: Reported on 09/16/2015 08/06/15   Ladell Pier, MD  digoxin (LANOXIN) 0.25 MG tablet Take 1 tablet (0.25 mg total) by mouth daily. 09/15/15   Robbie Lis, MD  glipiZIDE (GLUCOTROL) 10 MG tablet Take 10 mg by mouth 2 (two) times daily before a meal.  09/18/12   Historical Provider, MD  guaiFENesin (MUCINEX) 600 MG 12 hr tablet Take 1 tablet (600 mg total) by mouth 2 (two) times daily. 10/02/15   Barton Dubois, MD  hyaluronate sodium (RADIAPLEXRX) GEL Apply 1 application topically as needed (treatment).     Historical Provider, MD  HYDROcodone-acetaminophen (NORCO/VICODIN) 5-325 MG per tablet TAKE 1 TABLET BY MOUTH 4 TIMES DAILY AS NEEDED FOR PAIN Patient not taking: Reported on 09/30/2015 09/03/15   Owens Shark, NP  insulin aspart (NOVOLOG FLEXPEN) 100 UNIT/ML FlexPen  Use the following scale to administer insulin depending on CBG               CBG 121 - 150:  2  units    CBG 151 - 200: 3 units    CBG 201 - 250: 5 units    CBG 251 - 300: 8 units    CBG 301 - 350: 11 units    CBG 351 - 400: 15 units 09/15/15   Robbie Lis, MD  levofloxacin (LEVAQUIN) 750 MG tablet Take 1 tablet (750 mg total) by mouth daily. 10/02/15   Barton Dubois, MD  levothyroxine (SYNTHROID, LEVOTHROID) 75 MCG tablet Take 75 mcg by mouth daily before breakfast.  12/06/14   Historical Provider, MD  Liraglutide (VICTOZA) 18 MG/3ML SOPN Inject 1.8 mg into the skin daily.    Historical Provider, MD  metoprolol succinate (TOPROL-XL) 100 MG 24 hr tablet Take 1 tablet by mouth daily. 09/02/15   Historical Provider, MD  nitroGLYCERIN (NITROSTAT) 0.4 MG SL tablet Place 1 tablet (0.4 mg total) under the tongue every 5 (five) minutes x 3 doses as needed for chest pain. 10/03/14   Peter M Martinique, MD  ondansetron (ZOFRAN) 8 MG tablet Take 1 tablet (8 mg total) by mouth every 8 (eight) hours as needed for nausea or vomiting. 09/21/15   Rondel Jumbo, PA-C  prochlorperazine (COMPAZINE) 10 MG tablet Take 1 tablet (10 mg total) by mouth every 6 (six) hours as needed for nausea or vomiting. 09/03/15   Owens Shark, NP  ranitidine (ZANTAC) 300 MG tablet Take 1 tablet (300 mg total) by mouth daily as needed for heartburn. 02/16/15   Peter M Martinique, MD  saccharomyces boulardii (FLORASTOR) 250 MG capsule Take 1 capsule (250 mg total) by mouth 2 (two) times daily. 10/02/15   Barton Dubois, MD  sucralfate (CARAFATE) 1 G tablet Take 1 tablet (1 g total) by mouth 4 (four) times daily. 08/21/15   Kyung Rudd, MD  traMADol (ULTRAM) 50 MG tablet Take 1 tablet (50 mg total) by mouth every 6 (six) hours as needed for moderate pain. 07/30/15   Ladell Pier, MD   BP 126/65 mmHg  Pulse 140  Temp(Src) 97.5 F (36.4 C) (Oral)  Resp 18  Wt 227 lb (102.967 kg)  SpO2 97% Physical Exam  Constitutional: He is oriented to person, place, and time. He appears well-developed. No distress.   HENT:  Head: Normocephalic and atraumatic.  Eyes: Conjunctivae and EOM are normal.  Cardiovascular: Regular rhythm.  Tachycardia present.   Pulmonary/Chest: Effort normal. No stridor. No respiratory distress.  Abdominal: He exhibits no distension.  Musculoskeletal: He exhibits no edema.  Neurological: He is alert and oriented to person, place, and time.  Skin: Skin is warm and dry.  Psychiatric: He has a normal mood and affect.  Nursing note and vitals reviewed.   ED Course  Procedures (including critical care time) Labs Review Labs Reviewed  CBC - Abnormal; Notable for the following:    WBC 3.7 (*)    RBC 3.42 (*)    Hemoglobin 9.7 (*)    HCT 29.0 (*)    RDW 20.2 (*)    Platelets 116 (*)    All other components within normal limits  BASIC METABOLIC PANEL  I-STAT TROPOININ, ED    Imaging Review Dg Chest Portable 1 View  10/05/2015  CLINICAL DATA:  Atrial fibrillation. EXAM: PORTABLE CHEST 1 VIEW COMPARISON:  09/30/2015. FINDINGS: Stable enlarged cardiac silhouette and post CABG changes. The pulmonary vasculature and interstitial markings remain mildly prominent. Persistent ill-defined density at the left lung base. Stable left subclavian AICD lead. Unremarkable bones. IMPRESSION: 1. Stable mild left basilar atelectasis or scarring. 2. Stable mild cardiomegaly, mild pulmonary vascular congestion and mild chronic interstitial lung disease. Electronically Signed   By: Claudie Revering M.D.   On: 10/05/2015 14:19   I have personally reviewed and evaluated these images and lab results as part of my medical decision-making.   EKG Interpretation   Date/Time:  Monday October 05 2015 13:44:42 EDT Ventricular Rate:  145 PR Interval:    QRS Duration: 94 QT Interval:  322 QTC Calculation: 500 R Axis:   121 Text Interpretation:  Atrial fibrillation with rapid ventricular response  with premature ventricular or aberrantly conducted complexes Left  posterior fascicular block Possible  Anterior infarct , age  undetermined  Abnormal ECG Atrial fibrillation with rapid ventricular response Abnormal  ekg Confirmed by Carmin Muskrat  MD [phone]) on 10/05/2015 2:00:10 PM     Chart review demonstrates recent admission for weakness, as well as ongoing oncologic involvement.  Cardiac 141 A. fib, rapid ventricular response Pulse ox symmetry 97% room air normal  Following initial evaluation the patient received labetalol, 20 mg. Patient's heart rate diminished somewhat, pressure remained stable, and he received a second dose of 20 mg.  After 2 doses of labral, the patient's heart rate was in the 110 range, but blood pressure decreased below 887 systolic, no new complaints however.   3:32 PM On repeat exam the patient appears comfortable. Heart rate is now 110 range. I discussed the patient's case with his cardiology team, and given his lack of other concerns, patient will be evaluated by them .  MDM  Patient presents with mostly asymptomatic atrial fibrillation with rapid ventricular response. Patient is a poor historian but it seems as though he is taking the appropriate anticoagulation given his cardiac history, as well as beta blocker. Here the patient's labs are largely reassuring, but vital signs remained persistently abnormal in spite of multiple boluses of IV beta blocker. Given his transient hypotension, lack of response to appropriate therapy, I discussed his case with our cardiologist's for further evaluation, management, admission.  Carmin Muskrat, MD 10/06/15 2131

## 2015-10-05 NOTE — ED Notes (Signed)
Pt sent from [facility] d/t symptomatic rapid A Fib.  Rate 130s, SOB, and fatigue.  Hx of A Fib.

## 2015-10-05 NOTE — Telephone Encounter (Signed)
lvm for pt regarding to NOV appt.. °

## 2015-10-06 ENCOUNTER — Encounter (HOSPITAL_COMMUNITY): Payer: Self-pay | Admitting: Internal Medicine

## 2015-10-06 ENCOUNTER — Encounter (HOSPITAL_COMMUNITY)
Admission: EM | Disposition: A | Discharge status: Home / Self Care | Payer: Self-pay | Source: Home / Self Care | Attending: Cardiology

## 2015-10-06 ENCOUNTER — Other Ambulatory Visit: Payer: Self-pay

## 2015-10-06 ENCOUNTER — Observation Stay (HOSPITAL_COMMUNITY)
Admit: 2015-10-06 | Discharge: 2015-10-06 | Disposition: A | Discharge status: Home / Self Care | Payer: Medicare Other | Attending: Physician Assistant | Admitting: Physician Assistant

## 2015-10-06 DIAGNOSIS — E785 Hyperlipidemia, unspecified: Secondary | ICD-10-CM | POA: Diagnosis present

## 2015-10-06 DIAGNOSIS — E1121 Type 2 diabetes mellitus with diabetic nephropathy: Secondary | ICD-10-CM

## 2015-10-06 DIAGNOSIS — I481 Persistent atrial fibrillation: Secondary | ICD-10-CM | POA: Diagnosis present

## 2015-10-06 DIAGNOSIS — E876 Hypokalemia: Secondary | ICD-10-CM | POA: Diagnosis not present

## 2015-10-06 DIAGNOSIS — C155 Malignant neoplasm of lower third of esophagus: Secondary | ICD-10-CM | POA: Diagnosis present

## 2015-10-06 DIAGNOSIS — R0602 Shortness of breath: Secondary | ICD-10-CM | POA: Diagnosis present

## 2015-10-06 DIAGNOSIS — I5042 Chronic combined systolic (congestive) and diastolic (congestive) heart failure: Secondary | ICD-10-CM | POA: Diagnosis not present

## 2015-10-06 DIAGNOSIS — I25810 Atherosclerosis of coronary artery bypass graft(s) without angina pectoris: Secondary | ICD-10-CM | POA: Diagnosis not present

## 2015-10-06 DIAGNOSIS — Z794 Long term (current) use of insulin: Secondary | ICD-10-CM | POA: Diagnosis not present

## 2015-10-06 DIAGNOSIS — Z538 Procedure and treatment not carried out for other reasons: Secondary | ICD-10-CM | POA: Diagnosis present

## 2015-10-06 DIAGNOSIS — Z961 Presence of intraocular lens: Secondary | ICD-10-CM | POA: Diagnosis present

## 2015-10-06 DIAGNOSIS — Z7984 Long term (current) use of oral hypoglycemic drugs: Secondary | ICD-10-CM | POA: Diagnosis not present

## 2015-10-06 DIAGNOSIS — Z7901 Long term (current) use of anticoagulants: Secondary | ICD-10-CM | POA: Diagnosis not present

## 2015-10-06 DIAGNOSIS — Z951 Presence of aortocoronary bypass graft: Secondary | ICD-10-CM | POA: Diagnosis not present

## 2015-10-06 DIAGNOSIS — I130 Hypertensive heart and chronic kidney disease with heart failure and stage 1 through stage 4 chronic kidney disease, or unspecified chronic kidney disease: Secondary | ICD-10-CM | POA: Diagnosis present

## 2015-10-06 DIAGNOSIS — J849 Interstitial pulmonary disease, unspecified: Secondary | ICD-10-CM | POA: Diagnosis present

## 2015-10-06 DIAGNOSIS — M109 Gout, unspecified: Secondary | ICD-10-CM | POA: Diagnosis present

## 2015-10-06 DIAGNOSIS — I5043 Acute on chronic combined systolic (congestive) and diastolic (congestive) heart failure: Secondary | ICD-10-CM | POA: Diagnosis not present

## 2015-10-06 DIAGNOSIS — I739 Peripheral vascular disease, unspecified: Secondary | ICD-10-CM | POA: Diagnosis present

## 2015-10-06 DIAGNOSIS — I2510 Atherosclerotic heart disease of native coronary artery without angina pectoris: Secondary | ICD-10-CM | POA: Diagnosis present

## 2015-10-06 DIAGNOSIS — N183 Chronic kidney disease, stage 3 (moderate): Secondary | ICD-10-CM | POA: Diagnosis present

## 2015-10-06 DIAGNOSIS — Z95810 Presence of automatic (implantable) cardiac defibrillator: Secondary | ICD-10-CM

## 2015-10-06 DIAGNOSIS — Z87891 Personal history of nicotine dependence: Secondary | ICD-10-CM | POA: Diagnosis not present

## 2015-10-06 DIAGNOSIS — Z9221 Personal history of antineoplastic chemotherapy: Secondary | ICD-10-CM | POA: Diagnosis not present

## 2015-10-06 DIAGNOSIS — Z9842 Cataract extraction status, left eye: Secondary | ICD-10-CM | POA: Diagnosis not present

## 2015-10-06 DIAGNOSIS — F419 Anxiety disorder, unspecified: Secondary | ICD-10-CM | POA: Diagnosis present

## 2015-10-06 DIAGNOSIS — K219 Gastro-esophageal reflux disease without esophagitis: Secondary | ICD-10-CM | POA: Diagnosis present

## 2015-10-06 DIAGNOSIS — E1122 Type 2 diabetes mellitus with diabetic chronic kidney disease: Secondary | ICD-10-CM | POA: Diagnosis present

## 2015-10-06 DIAGNOSIS — J45909 Unspecified asthma, uncomplicated: Secondary | ICD-10-CM | POA: Diagnosis present

## 2015-10-06 DIAGNOSIS — I4891 Unspecified atrial fibrillation: Secondary | ICD-10-CM | POA: Diagnosis not present

## 2015-10-06 DIAGNOSIS — Z79899 Other long term (current) drug therapy: Secondary | ICD-10-CM | POA: Diagnosis not present

## 2015-10-06 DIAGNOSIS — Z923 Personal history of irradiation: Secondary | ICD-10-CM | POA: Diagnosis not present

## 2015-10-06 DIAGNOSIS — C169 Malignant neoplasm of stomach, unspecified: Secondary | ICD-10-CM | POA: Diagnosis present

## 2015-10-06 DIAGNOSIS — D638 Anemia in other chronic diseases classified elsewhere: Secondary | ICD-10-CM | POA: Diagnosis not present

## 2015-10-06 DIAGNOSIS — J449 Chronic obstructive pulmonary disease, unspecified: Secondary | ICD-10-CM | POA: Diagnosis present

## 2015-10-06 DIAGNOSIS — I255 Ischemic cardiomyopathy: Secondary | ICD-10-CM | POA: Diagnosis present

## 2015-10-06 DIAGNOSIS — G4733 Obstructive sleep apnea (adult) (pediatric): Secondary | ICD-10-CM | POA: Diagnosis present

## 2015-10-06 HISTORY — PX: TEE WITHOUT CARDIOVERSION: SHX5443

## 2015-10-06 LAB — CULTURE, BLOOD (ROUTINE X 2)
Culture: NO GROWTH
Culture: NO GROWTH

## 2015-10-06 LAB — TROPONIN I: Troponin I: <0.03 ng/mL (ref <0.031)

## 2015-10-06 LAB — BASIC METABOLIC PANEL
ANION GAP: 4 — AB (ref 5–15)
BUN: 8 mg/dL (ref 6–20)
CALCIUM: 8.3 mg/dL — AB (ref 8.9–10.3)
CHLORIDE: 106 mmol/L (ref 101–111)
CO2: 25 mmol/L (ref 22–32)
Creatinine, Ser: 0.86 mg/dL (ref 0.61–1.24)
GFR calc non Af Amer: >60 mL/min (ref >60)
Glucose, Bld: 141 mg/dL — ABNORMAL HIGH (ref 65–99)
Potassium: 3.8 mmol/L (ref 3.5–5.1)
Sodium: 135 mmol/L (ref 135–145)

## 2015-10-06 SURGERY — ECHOCARDIOGRAM, TRANSESOPHAGEAL
Anesthesia: Moderate Sedation

## 2015-10-06 MED ORDER — FENTANYL CITRATE (PF) 100 MCG/2ML IJ SOLN
INTRAMUSCULAR | Status: AC
  Filled 2015-10-06: qty 2

## 2015-10-06 MED ORDER — MIDAZOLAM HCL 5 MG/ML IJ SOLN
INTRAMUSCULAR | Status: AC
  Filled 2015-10-06: qty 2

## 2015-10-06 MED ORDER — SODIUM CHLORIDE 0.9 % IV SOLN: INTRAVENOUS | Status: DC

## 2015-10-06 MED ORDER — LIDOCAINE VISCOUS 2 % MT SOLN
OROMUCOSAL | Status: AC
  Filled 2015-10-06: qty 15

## 2015-10-06 MED ORDER — FENTANYL CITRATE (PF) 100 MCG/2ML IJ SOLN
INTRAMUSCULAR | Status: DC | PRN
  Administered 2015-10-06 (×2): 25 ug via INTRAVENOUS

## 2015-10-06 MED ORDER — MIDAZOLAM HCL 10 MG/2ML IJ SOLN
INTRAMUSCULAR | Status: DC | PRN
  Administered 2015-10-06: 2 mg via INTRAVENOUS
  Administered 2015-10-06: 1 mg via INTRAVENOUS

## 2015-10-06 MED ORDER — PNEUMOCOCCAL VAC POLYVALENT 25 MCG/0.5ML IJ INJ
0.5000 mL | INJECTION | INTRAMUSCULAR | Status: DC
  Filled 2015-10-06: qty 0.5

## 2015-10-06 MED ORDER — FUROSEMIDE 10 MG/ML IJ SOLN
40.0000 mg | Freq: Once | INTRAMUSCULAR | Status: AC
  Administered 2015-10-06: 40 mg via INTRAVENOUS
  Filled 2015-10-06: qty 4

## 2015-10-06 MED ORDER — DILTIAZEM HCL 100 MG IV SOLR
5.0000 mg/h | INTRAVENOUS | Status: DC
  Administered 2015-10-06 (×2): 5 mg/h via INTRAVENOUS
  Filled 2015-10-06 (×2): qty 100

## 2015-10-06 MED ORDER — DIPHENHYDRAMINE HCL 50 MG/ML IJ SOLN
INTRAMUSCULAR | Status: AC
  Filled 2015-10-06: qty 1

## 2015-10-06 NOTE — Progress Notes (Signed)
Phlebotomy tech unable to obtain pts. Troponin lab draw x2. Pt. Refusing re-stick. On call MD, for Cardiology, Aundra Dubin, made aware. Instructions to reschedule lab draw for am labs given. Phlebotomy tech made aware. Jurell Basista, Katherine Roan

## 2015-10-06 NOTE — Progress Notes (Deleted)
    [facility] has been requested to perform a transesophageal echocardiogram on 10/06/2015 for atrial fibrillation.  After careful review of history and examination, the risks and benefits of transesophageal echocardiogram have been explained including risks of esophageal damage, perforation (1:10,000 risk), bleeding, pharyngeal hematoma as well as other potential complications associated with conscious sedation including aspiration, arrhythmia, respiratory failure and death. Alternatives to treatment were discussed, questions were answered. Patient is willing to proceed.   Discussed with patient regarding benefit and risk of TEE DCCV.  Almyra Deforest, PA-C 10/06/2015 7:47 AM

## 2015-10-06 NOTE — Progress Notes (Signed)
    [facility] has been requested to perform a transesophageal echocardiogram on 10/06/2015 for atrial fibrillation.  After careful review of history and examination, the risks and benefits of transesophageal echocardiogram have been explained including risks of esophageal damage, perforation (1:10,000 risk), bleeding, pharyngeal hematoma as well as other potential complications associated with conscious sedation including aspiration, arrhythmia, respiratory failure and death. Alternatives to treatment were discussed, questions were answered. Patient is willing to proceed.   Discussed with patient regarding benefit and risk of TEE DCCV.  Almyra Deforest, PA-C 10/06/2015 7:48 AM

## 2015-10-06 NOTE — Progress Notes (Signed)
Events of this am noted. Unable to complete TEE. Difficult adequately sedated patient. Then developed increased respiratory rate and tachycardia to 180. Patient awake and alert now. Doesn't remember procedure. Complains of increased. SOB. Will give IV lasix now. Agree with IV dilitazem for better rate control. Continue Eliquis. Will attempt TEE/DCCV tomorrow with anesthesia present.   Rosaire Cueto Martinique MD, [facility]

## 2015-10-06 NOTE — CV Procedure (Signed)
    TRANSESOPHAGEAL ECHOCARDIOGRAM (TEE) NOTE  INDICATIONS: CHF, a-fib with RVR  PROCEDURE:   Informed consent was obtained prior to the procedure. The risks, benefits and alternatives for the procedure were discussed and the patient comprehended these risks.  Risks include, but are not limited to, cough, sore throat, vomiting, nausea, somnolence, esophageal and stomach trauma or perforation, bleeding, low blood pressure, aspiration, pneumonia, infection, trauma to the teeth and death.    After a procedural time-out, the patient was given 3 mg versed and 50 mcg fentanyl for moderate sedation.  The oropharynx was anesthetized with 2 cetacaine sprays.  The transesophageal probe could not be passed, due to inadequate sedation, patient coughing and secretions. HR was very high with a-fib in the 180's and RR was 30 with shallow breathing. Lungs clear.  It was decided to discontinue the procedure, in favor of another attempt at TEE/Cardioversion tomorrow with anesthesia.  COMPLICATIONS:    (see above, due to inability to adequately sedate and hemodynamic instability, the procedure was terminated).  RECOMMENDATIONS:    1. Reschedule TEE/DCCV tomorrow with MAC/Anesthesia.  2. Will start diltiazem infusion for better rate control.  Time Spent Directly with the Patient:  45 minutes   Pixie Casino, MD, [facility] Attending Cardiologist [facility]  10/06/2015, 9:55 AM

## 2015-10-06 NOTE — Progress Notes (Signed)
Updated MD Eula Fried on pt HR, order to continue cardizem drip at current rate. Pt has no complaints at this time. We will continue to monitor.

## 2015-10-06 NOTE — H&P (Addendum)
     INTERVAL PROCEDURE H&P  History and Physical Interval Note:  10/06/2015 8:06 AM  Walter French has presented today for their planned procedure. The various methods of treatment have been discussed with the patient and family. After consideration of risks, benefits and other options for treatment, the patient has consented to the procedure.  The patients' outpatient history has been reviewed, patient examined, and no change in status from most recent office note within the past 30 days. I have reviewed the patients' chart and labs and will proceed as planned. Questions were answered to the patient's satisfaction.   Will proceed with diagnostic TEE today - anesthesia is not available for cardioversion.  Pixie Casino, MD, [facility] Attending Cardiologist [facility] 10/06/2015, 8:06 AM

## 2015-10-07 ENCOUNTER — Other Ambulatory Visit: Payer: Self-pay

## 2015-10-07 ENCOUNTER — Encounter (HOSPITAL_COMMUNITY)
Admission: EM | Disposition: A | Discharge status: Home / Self Care | Payer: Self-pay | Source: Home / Self Care | Attending: Cardiology

## 2015-10-07 ENCOUNTER — Encounter (HOSPITAL_COMMUNITY): Payer: Self-pay | Admitting: Internal Medicine

## 2015-10-07 ENCOUNTER — Inpatient Hospital Stay (HOSPITAL_COMMUNITY): Payer: Medicare Other | Admitting: Certified Registered Nurse Anesthetist

## 2015-10-07 ENCOUNTER — Inpatient Hospital Stay (HOSPITAL_COMMUNITY): Payer: Medicare Other

## 2015-10-07 DIAGNOSIS — I25810 Atherosclerosis of coronary artery bypass graft(s) without angina pectoris: Secondary | ICD-10-CM

## 2015-10-07 DIAGNOSIS — I5043 Acute on chronic combined systolic (congestive) and diastolic (congestive) heart failure: Secondary | ICD-10-CM

## 2015-10-07 LAB — BRAIN NATRIURETIC PEPTIDE: B NATRIURETIC PEPTIDE 5: 999.3 pg/mL — AB (ref 0.0–100.0)

## 2015-10-07 SURGERY — CANCELLED PROCEDURE
Anesthesia: Monitor Anesthesia Care

## 2015-10-07 MED ORDER — FUROSEMIDE 10 MG/ML IJ SOLN
40.0000 mg | Freq: Two times a day (BID) | INTRAMUSCULAR | Status: DC
  Administered 2015-10-07 – 2015-10-08 (×3): 40 mg via INTRAVENOUS
  Filled 2015-10-07 (×3): qty 4

## 2015-10-07 MED ORDER — PNEUMOCOCCAL VAC POLYVALENT 25 MCG/0.5ML IJ INJ
0.5000 mL | INJECTION | INTRAMUSCULAR | Status: AC
  Administered 2015-10-08: 0.5 mL via INTRAMUSCULAR
  Filled 2015-10-07: qty 0.5

## 2015-10-07 MED ORDER — AMIODARONE HCL IN DEXTROSE 360-4.14 MG/200ML-% IV SOLN
60.0000 mg/h | INTRAVENOUS | Status: AC
  Administered 2015-10-07: 60 mg/h via INTRAVENOUS
  Filled 2015-10-07: qty 200

## 2015-10-07 MED ORDER — AMIODARONE HCL IN DEXTROSE 360-4.14 MG/200ML-% IV SOLN
30.0000 mg/h | INTRAVENOUS | Status: AC
  Administered 2015-10-07: 60 mg/h via INTRAVENOUS
  Administered 2015-10-08: 30 mg/h via INTRAVENOUS
  Filled 2015-10-07 (×5): qty 200

## 2015-10-07 MED ORDER — DIPHENHYDRAMINE HCL 25 MG PO CAPS
25.0000 mg | ORAL_CAPSULE | Freq: Every evening | ORAL | Status: DC | PRN
  Administered 2015-10-07 – 2015-10-09 (×3): 25 mg via ORAL
  Filled 2015-10-07 (×3): qty 1

## 2015-10-07 MED ORDER — AMIODARONE LOAD VIA INFUSION
150.0000 mg | Freq: Once | INTRAVENOUS | Status: AC
  Administered 2015-10-07: 150 mg via INTRAVENOUS
  Filled 2015-10-07: qty 83.34

## 2015-10-07 NOTE — Progress Notes (Signed)
Heart Failure Navigator Consult Note  Presentation: Walter French is a 69 yo male with PMH of CAD s/p CABG x 3 (LIMA to LAD, SVG to PDA, SVG to PL), ICM with EF 30-35%, h/o VT s/p MDT ICD, chronic combined systolic and diastolic HF, PAD, HTN, HLD, DM, OSA and gastroesophageal cancer s/p chemo and radiation therapy presented with afib with RVR.   Past Medical History  Diagnosis Date  . Hyperlipidemia   . Hypertension   . Coronary heart disease     a. s/p CABG x 3 (VG->PDA, VG->PL, LIMA->LAD);  b. 2013 Abnl Myoview;  c. 2013 Cath: 3/3 patent grafts, occluded LCX which correlated w/ ischemia on MV->not amenable to PCI->Med Rx.  . Obesity   . CHF (congestive heart failure) ([place])   . VT (ventricular tachycardia) ([place])   . SVT (supraventricular tachycardia) ([place])   . PAD (peripheral artery disease) ([place])     Angiography in February of 2014: Moderate left iliac artery stenosis, mild to moderate diffuse right SFA disease. Severe proximal right popliteal artery stenosis with three-vessel runoff below the knee. Status post self-expanding stent placement to the proximal popliteal artery  . Implantable cardioverter-defibrillator Mdt   . Asthma     "small touch" (07/29/2013)  . History of pneumonia     "3-4 times; last time ~ 2003" (07/29/2013)  . OSA on CPAP   . GERD (gastroesophageal reflux disease)   . H/O hiatal hernia   . Gout   . Anxiety   . Allergy   . Stomach cancer ([place]) 07/23/2015    invasive adenocarcinoma ,esophagus ,distal   . Type II diabetes mellitus ([place])     on insulin  . Sleep apnea   . Hyperlipidemia   . PAD (peripheral artery disease) ([place])   . CHF (congestive heart failure) ([place])   . A-fib ([place])   . CKD (chronic kidney disease), stage III     Social History   Social History  . Marital Status: Single    Spouse Name: N/A  . Number of Children: 0  . Years of Education: N/A   Occupational History  . retired Surveyor, mining   .     Social History Main  Topics  . Smoking status: Former Smoker -- 2.00 packs/day for 30 years    Types: Cigarettes    Quit date: 12/13/1995  . Smokeless tobacco: Never Used  . Alcohol Use: No     Comment: 07/29/2013 "not touched any alcohol since 1997"  . Drug Use: No  . Sexual Activity: Not Currently   Other Topics Concern  . None   Social History Narrative   Single, never married   Retired Administrator   Close friend, [name] and her children are his main support system   Independent in ADLs.    ECHO:Study Conclusions--07/23/15  - Left ventricle: The cavity size was normal. Wall thickness was increased in a pattern of moderate LVH. There was focal basal hypertrophy. Systolic function was moderately to severely reduced. The estimated ejection fraction was in the range of 30% to 35%. Features are consistent with a pseudonormal left ventricular filling pattern, with concomitant abnormal relaxation and increased filling pressure (grade 2 diastolic dysfunction). - Pulmonary arteries: Systolic pressure was mildly increased. PA peak pressure: 34 mm Hg (S).  Transthoracic echocardiography. M-mode, complete 2D, spectral Doppler, and color Doppler. Birthdate: Patient birthdate: [birth year]. Age: Patient is 69 yr old. Sex: Gender: male. BMI: 33.5 kg/m^2. Blood pressure:   172/74 Patient status: Inpatient. Study  date: Study date: 07/23/2015. Study time: 02:28 PM. Location: Echo laboratory.  BNP    Component Value Date/Time   BNP 999.3* 10/07/2015 0258    ProBNP    Component Value Date/Time   PROBNP 250.7* 07/29/2013 1935     Education Assessment and Provision:  Detailed education and instructions provided on heart failure disease management including the following:  Signs and symptoms of Heart Failure When to call the physician Importance of daily weights Low sodium diet Fluid restriction Medication management Anticipated future follow-up  appointments  Patient education given on each of the above topics.  Patient acknowledges understanding and acceptance of all instructions.  I spoke at length with [name] explaining HF and HF recommendations.  He does not currently weigh daily.  I discussed the importance of daily weights and how they relate to signs and symptoms of HF.   I reviewed a low sodium diet and high sodium foods to avoid.  He denies any issues getting or taking prescribed medications.  [name] currently lives alone in [place].  He has a son that is local and provides support as needed.  He will follow with Dr. Martinique as an outpatient at [facility].  Education Materials:  "Living Better With Heart Failure" Booklet, Daily Weight Tracker Tool.   High Risk Criteria for Readmission and/or Poor Patient Outcomes:   EF <30%-30 -35% with grade 2 dias dys  2 or more admissions in 6 months- 4/6  Difficult social situation- No  Demonstrates medication noncompliance- No denies    Barriers of Care:  Knowledge and compliance  Discharge Planning:   Plans to return to home alone.  Needs ongoing education, compliance reinforcement and symptom recognition

## 2015-10-07 NOTE — Research (Signed)
ReDS Vest at Discharge Informed Consent   Subject Name: Walter French  Subject met inclusion and exclusion criteria.  The informed consent form, study requirements and expectations were reviewed with the subject and questions and concerns were addressed prior to the signing of the consent form.  The subject verbalized understanding of the trail requirements.  The subject agreed to participate in the ReDS Vest at Discharge trial and signed the informed consent.  The informed consent was obtained prior to performance of any protocol-specific procedures for the subject.  A copy of the signed informed consent was given to the subject and a copy was placed in the subject's medical record.  Nadine Counts 10/07/2015, 3:20 PM

## 2015-10-07 NOTE — Anesthesia Preprocedure Evaluation (Deleted)
Anesthesia Evaluation  Patient identified by MRN, date of birth, ID band Patient awake    Reviewed: Allergy & Precautions, NPO status , Patient's Chart, lab work & pertinent test results  Airway Mallampati: II  TM Distance: >3 FB Neck ROM: Full    Dental no notable dental hx.    Pulmonary neg pulmonary ROS, former smoker,    Pulmonary exam normal breath sounds clear to auscultation       Cardiovascular hypertension, + CAD, + CABG, + Peripheral Vascular Disease and +CHF  + dysrhythmias Atrial Fibrillation + Cardiac Defibrillator  Rhythm:Irregular Rate:Normal  Left ventricle: The cavity size was normal. Wall thickness was increased in a pattern of moderate LVH. There was focal basal hypertrophy. Systolic function was moderately to severely reduced. The estimated ejection fraction was in the range of 30% to 35%. Features are consistent with a pseudonormal left ventricular filling pattern, with concomitant abnormal relaxation and increased filling pressure (grade 2 diastolic dysfunction). - Pulmonary arteries: Systolic pressure was mildly increased. PA peak pressure: 34 mm Hg (S).    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 ,esophagus ,distal      Endo/Other  diabetesHypothyroidism   Renal/GU negative Renal ROS  negative genitourinary   Musculoskeletal negative musculoskeletal ROS (+)   Abdominal   Peds negative pediatric ROS (+)  Hematology  (+) anemia ,   Anesthesia Other Findings   Reproductive/Obstetrics negative OB ROS                            Anesthesia Physical Anesthesia Plan  ASA: IV  Anesthesia Plan: MAC   Post-op Pain Management:    Induction: Intravenous  Airway Management Planned: Mask  Additional Equipment:   Intra-op Plan:   Post-operative  Plan:   Informed Consent: I have reviewed the patients History and Physical, chart, labs and discussed the procedure including the risks, benefits and alternatives for the proposed anesthesia with the patient or authorized representative who has indicated his/her understanding and acceptance.   Dental advisory given  Plan Discussed with: CRNA and Surgeon  Anesthesia Plan Comments:         Anesthesia Quick Evaluation

## 2015-10-07 NOTE — Progress Notes (Signed)
TELEMETRY: Reviewed telemetry pt in atrial fibrillation with controlled rate 95-105. PVC triplet. 5 beat run NSVT: Filed Vitals:   10/06/15 2150 10/07/15 0302 10/07/15 0428 10/07/15 0548  BP: 112/62 114/59  104/66  Pulse: 80 92  93  Temp: 98.7 F (37.1 C) 99.7 F (37.6 C)  98.1 F (36.7 C)  TempSrc: Oral Oral  Oral  Resp:  25  20  Height:      Weight:   101.2 kg (223 lb 1.7 oz)   SpO2: 98% 95%  93%    Intake/Output Summary (Last 24 hours) at 10/07/15 0818 Last data filed at 10/07/15 0600  Gross per 24 hour  Intake 695.08 ml  Output   1850 ml  Net -1154.92 ml   Filed Weights   10/06/15 0613 10/06/15 0819 10/07/15 0428  Weight: 101.76 kg (224 lb 5.4 oz) 101.606 kg (224 lb) 101.2 kg (223 lb 1.7 oz)    Subjective Doing OK today. Still notes dyspnea. No chest pain.   [name] allopurinol  300 mg Oral Daily  . apixaban  5 mg Oral BID  . atorvastatin  40 mg Oral Daily  . digoxin  0.25 mg Oral Daily  . furosemide  40 mg Intravenous Q12H  . guaiFENesin  600 mg Oral BID  . levothyroxine  75 mcg Oral QAC breakfast  . metoprolol tartrate  50 mg Oral TID  . pneumococcal 23 valent vaccine  0.5 mL Intramuscular Tomorrow-1000  . saccharomyces boulardii  250 mg Oral BID  . sodium chloride  3 mL Intravenous Q12H   . sodium chloride    . sodium chloride Stopped (10/07/15 0600)  . diltiazem (CARDIZEM) infusion 5 mg/hr (10/06/15 2154)    LABS: Basic Metabolic Panel:  Recent Labs  10/05/15 1548 10/06/15 0755  NA 136 135  K 3.8 3.8  CL 104 106  CO2 25 25  GLUCOSE 78 141*  BUN 7 8  CREATININE 0.85 0.86  CALCIUM 8.5* 8.3*   Liver Function Tests: No results for input(s): AST, ALT, ALKPHOS, BILITOT, PROT, ALBUMIN in the last 72 hours. No results for input(s): LIPASE, AMYLASE in the last 72 hours. CBC:  Recent Labs  10/05/15 1118 10/05/15 1405  WBC 4.2 3.7*  NEUTROABS 1.9  --   HGB 9.4* 9.7*  HCT 28.4* 29.0*  MCV 85.1 84.8  PLT 95* 116*   Cardiac Enzymes:  Recent  Labs  10/05/15 1907 10/06/15 0755  TROPONINI <0.03 <0.03   BNP: No results for input(s): PROBNP in the last 72 hours. D-Dimer: No results for input(s): DDIMER in the last 72 hours. Hemoglobin A1C: No results for input(s): HGBA1C in the last 72 hours. Fasting Lipid Panel: No results for input(s): CHOL, HDL, LDLCALC, TRIG, CHOLHDL, LDLDIRECT in the last 72 hours. Thyroid Function Tests: No results for input(s): TSH, T4TOTAL, T3FREE, THYROIDAB in the last 72 hours.  Invalid input(s): FREET3   Radiology/Studies:  Dg Chest Portable 1 View  10/05/2015  CLINICAL DATA:  Atrial fibrillation. EXAM: PORTABLE CHEST 1 VIEW COMPARISON:  09/30/2015. FINDINGS: Stable enlarged cardiac silhouette and post CABG changes. The pulmonary vasculature and interstitial markings remain mildly prominent. Persistent ill-defined density at the left lung base. Stable left subclavian AICD lead. Unremarkable bones. IMPRESSION: 1. Stable mild left basilar atelectasis or scarring. 2. Stable mild cardiomegaly, mild pulmonary vascular congestion and mild chronic interstitial lung disease. Electronically Signed   By: Claudie Revering M.D.   On: 10/05/2015 14:19    PHYSICAL EXAM General: Pleasant, NAD Psych: Normal affect.  Neuro: Alert and oriented X 3. Moves all extremities spontaneously. HEENT: Normal Neck: Supple without bruits or JVD. Lungs: Resp regular and unlabored, CTA Heart: tachycardia. no s3, s4, or murmurs. Abdomen: Soft, non-tender, non-distended, BS + x 4.  Extremities: No clubbing, cyanosis. DP/PT/Radials 2+ and equal bilaterally. 1+ RLE pitting edema, no edema on the L side.  ASSESSMENT AND PLAN: 1. Atrial fibrillation with RVR- persistent. Rate control improved on IV diltiazem. On oral dig and metoprolol. Plan TEE guided DCCV today. Unable to complete TEE yesterday due to difficulty with sedated and hemodynamic instability. Good diuresis last night and improved HR control should facilitate  procedure. Continue Eliquis.  2. Acute on chronic combined systolic/diastolic CHF. Exacerbated by atrial fibrillation. EF 30-35%. BNP 999.3. Continue IV lasix 40 mg bid. Renal function and potassium stable.  3. CAD s/p CABG. Cardiac cath in August showed all grafts patent.  4. DM type 2 5. Remote history of VT. S/p ICD 6. AdenoCA of GE junction. Recently diagnosed s/p chemo and RT 7. PAD 8. HTN controlled.   Present on Admission:  . Atrial fibrillation with RVR ([place]) . Coronary artery disease-status post CABG . Type 2 diabetes mellitus with renal manifestations ([place]) . Anemia of chronic disease . Systolic and diastolic CHF, chronic ([place]) . Automatic implantable cardioverter-defibrillator in situ  Signed, Malosi Hemstreet [place], [place] 10/07/2015 8:18 AM

## 2015-10-08 ENCOUNTER — Encounter: Payer: Self-pay | Admitting: Oncology

## 2015-10-08 DIAGNOSIS — I481 Persistent atrial fibrillation: Secondary | ICD-10-CM | POA: Diagnosis not present

## 2015-10-08 LAB — BASIC METABOLIC PANEL
ANION GAP: 8 (ref 5–15)
BUN: 11 mg/dL (ref 6–20)
CALCIUM: 8 mg/dL — AB (ref 8.9–10.3)
CO2: 26 mmol/L (ref 22–32)
Chloride: 97 mmol/L — ABNORMAL LOW (ref 101–111)
Creatinine, Ser: 1.02 mg/dL (ref 0.61–1.24)
GFR calc Af Amer: >60 mL/min (ref >60)
GLUCOSE: 137 mg/dL — AB (ref 65–99)
POTASSIUM: 3.2 mmol/L — AB (ref 3.5–5.1)
SODIUM: 131 mmol/L — AB (ref 135–145)

## 2015-10-08 MED ORDER — FUROSEMIDE 40 MG PO TABS
40.0000 mg | ORAL_TABLET | Freq: Every day | ORAL | Status: DC
Start: 2015-10-09 — End: 2015-10-10
  Administered 2015-10-09 – 2015-10-10 (×2): 40 mg via ORAL
  Filled 2015-10-08 (×2): qty 1

## 2015-10-08 MED ORDER — AMIODARONE HCL 200 MG PO TABS
400.0000 mg | ORAL_TABLET | Freq: Two times a day (BID) | ORAL | Status: DC
  Administered 2015-10-08 – 2015-10-10 (×5): 400 mg via ORAL
  Filled 2015-10-08 (×5): qty 2

## 2015-10-08 MED ORDER — POTASSIUM CHLORIDE CRYS ER 20 MEQ PO TBCR
20.0000 meq | EXTENDED_RELEASE_TABLET | Freq: Every day | ORAL | Status: DC
  Administered 2015-10-08 – 2015-10-09 (×2): 20 meq via ORAL
  Filled 2015-10-08 (×2): qty 1

## 2015-10-08 NOTE — Progress Notes (Signed)
Per [employer] ondansetron was approved already---- an exception was granted already. See prev. I sent to medical records.

## 2015-10-08 NOTE — Progress Notes (Signed)
Patient Name: Walter French Date of Encounter: 10/08/2015  Primary Cardiologist: Dr. Martinique Primary Electrophysiologist: Dr. Caryl Comes    Principal Problem:   Atrial fibrillation with RVR [facility]) Active Problems:   Coronary artery disease-status post CABG   Automatic implantable cardioverter-defibrillator in situ   Anemia of chronic disease   Type 2 diabetes mellitus with renal manifestations ([place])   Acute on chronic combined systolic (congestive) and diastolic (congestive) heart failure ([place])    SUBJECTIVE  Denies any CP or SOB. Feeling ok.  CURRENT MEDS . allopurinol  300 mg Oral Daily  . apixaban  5 mg Oral BID  . atorvastatin  40 mg Oral Daily  . digoxin  0.25 mg Oral Daily  . furosemide  40 mg Intravenous Q12H  . guaiFENesin  600 mg Oral BID  . levothyroxine  75 mcg Oral QAC breakfast  . metoprolol tartrate  50 mg Oral TID  . saccharomyces boulardii  250 mg Oral BID  . sodium chloride  3 mL Intravenous Q12H    OBJECTIVE  Filed Vitals:   10/07/15 2046 10/08/15 0037 10/08/15 0450 10/08/15 0824  BP: 105/62 105/48 111/69 123/54  Pulse: 95 97 107 78  Temp: 99.8 F (37.7 C) 98.6 F (37 C) 98.5 F (36.9 C) 97.9 F (36.6 C)  TempSrc: Oral Oral Oral Oral  Resp: 30 24 32   Height:      Weight:   217 lb (98.431 kg)   SpO2: 96% 93% 90% 98%    Intake/Output Summary (Last 24 hours) at 10/08/15 1224 Last data filed at 10/08/15 1156  Gross per 24 hour  Intake  526.6 ml  Output   3815 ml  Net -3288.4 ml   Filed Weights   10/06/15 0819 10/07/15 0428 10/08/15 0450  Weight: 224 lb (101.606 kg) 223 lb 1.7 oz (101.2 kg) 217 lb (98.431 kg)    PHYSICAL EXAM  General: Pleasant, NAD. Neuro: Alert and oriented X 3. Moves all extremities spontaneously. Psych: Normal affect. HEENT:  Normal  Neck: Supple without bruits or JVD. Lungs:  Resp regular and unlabored, CTA, mildly diminished breath near bilateral bases Heart: irregular. no s3, s4, or murmurs. Abdomen: Soft,  non-tender, non-distended, BS + x 4.  Extremities: No clubbing, cyanosis or edema. DP/PT/Radials 2+ and equal bilaterally.  Accessory Clinical Findings  CBC  Recent Labs  10/05/15 1405  WBC 3.7*  HGB 9.7*  HCT 29.0*  MCV 84.8  PLT 759*   Basic Metabolic Panel  Recent Labs  10/06/15 0755 10/08/15 0310  NA 135 131*  K 3.8 3.2*  CL 106 97*  CO2 25 26  GLUCOSE 141* 137*  BUN 8 11  CREATININE 0.86 1.02  CALCIUM 8.3* 8.0*   Cardiac Enzymes  Recent Labs  10/05/15 1907 10/06/15 0755  TROPONINI <0.03 <0.03    TELE A-fib with HR 80s, down from 120s overnight.     ECG  No new EKG  Echocardiogram 07/23/2015  LV EF: 30% -  35%  ------------------------------------------------------------------- Indications:   CAD of native vessels 414.01.  ------------------------------------------------------------------- History:  PMH:  Chest pain. Congestive heart failure. Chronic obstructive pulmonary disease. Risk factors: Hypertension. Dyslipidemia.  ------------------------------------------------------------------- Study Conclusions  - Left ventricle: The cavity size was normal. Wall thickness was increased in a pattern of moderate LVH. There was focal basal hypertrophy. Systolic function was moderately to severely reduced. The estimated ejection fraction was in the range of 30% to 35%. Features are consistent with a pseudonormal left ventricular filling pattern, with concomitant  abnormal relaxation and increased filling pressure (grade 2 diastolic dysfunction). - Pulmonary arteries: Systolic pressure was mildly increased. PA peak pressure: 34 mm Hg (S).     Radiology/Studies  Dg Chest 2 View  09/30/2015  CLINICAL DATA:  Shortness of breath and cough for 3 days, generalized weakness EXAM: CHEST - 2 VIEW COMPARISON:  9/29/616 FINDINGS: Cardiac shadow is enlarged. A defibrillator is again seen. Postsurgical changes are noted. Lingular  atelectatic changes are again seen. No sizable effusion is noted. No acute bony abnormality is seen. IMPRESSION: Stable changes in the lingula.  No acute abnormality noted. Electronically Signed   By: Inez Catalina M.D.   On: 09/30/2015 22:12   Ct Angio Chest Pe W/cm &/or Wo Cm  09/10/2015  CLINICAL DATA:  Presyncope, esophageal carcinoma EXAM: CT ANGIOGRAPHY CHEST WITH CONTRAST TECHNIQUE: Multidetector CT imaging of the chest was performed using the standard protocol during bolus administration of intravenous contrast. Multiplanar CT image reconstructions and MIPs were obtained to evaluate the vascular anatomy. CONTRAST:  111mL OMNIPAQUE IOHEXOL 350 MG/ML SOLN COMPARISON:  Chest radiograph same date, PET-CT 08/07/2015 FINDINGS: Mediastinum/Nodes: Left-sided AICD in place. Evidence of CABG. Great vessels are normal in caliber. Moderate atheromatous coronary arterial and aortic calcification. Mild esophageal distention is identified. The previously evaluated mass of the distal esophagus is not specifically re- visualized without oral contrast today. Periesophageal lymph node measures 0.8 cm in maximal short axis diameter image 53. Retrocrural distal esophageal node measures 1.0 cm image 67. Heart size is mildly enlarged.  No pericardial effusion. The examination is adequate for evaluation for acute pulmonary embolism up to and including the 3rd order pulmonary arteries. No focal filling defect is identified up to and including the 3rd order pulmonary arteries to suggest acute pulmonary embolism. Lungs/Pleura: Minimal bibasilar dependent subpleural atelectasis. Bibasilar bronchiectasis is noted. Lingular volume loss. No measurable nodule, consolidation, or mass. Central airways are patent. Upper abdomen: Otherwise normal, please see comments above. Musculoskeletal: No acute osseous abnormality or lytic or sclerotic osseous lesion. Review of the MIP images confirms the above findings. IMPRESSION: No acute  cardiopulmonary process. Mild bronchiectasis. Lingular volume loss and scarring or atelectasis. Mild esophageal distention by fluid and periesophageal/retrocrural lymphadenopathy compatible with the provided history of esophageal carcinoma. Electronically Signed   By: Conchita Paris M.D.   On: 09/10/2015 19:41   Dg Chest Portable 1 View  10/05/2015  CLINICAL DATA:  Atrial fibrillation. EXAM: PORTABLE CHEST 1 VIEW COMPARISON:  09/30/2015. FINDINGS: Stable enlarged cardiac silhouette and post CABG changes. The pulmonary vasculature and interstitial markings remain mildly prominent. Persistent ill-defined density at the left lung base. Stable left subclavian AICD lead. Unremarkable bones. IMPRESSION: 1. Stable mild left basilar atelectasis or scarring. 2. Stable mild cardiomegaly, mild pulmonary vascular congestion and mild chronic interstitial lung disease. Electronically Signed   By: Claudie Revering M.D.   On: 10/05/2015 14:19   Dg Chest Port 1 View  09/10/2015  CLINICAL DATA:  Dizziness. Currently receiving treatment for esophageal carcinoma EXAM: PORTABLE CHEST 1 VIEW COMPARISON:  07/20/2015 FINDINGS: Heart size upper normal. Postop CABG. AICD unchanged in position. Negative for heart failure Mild left lower lobe atelectasis has progressed in the interval. Negative for pneumonia or effusion. No mass or lung nodule. IMPRESSION: Mild left lower lobe atelectasis. Electronically Signed   By: Franchot Gallo M.D.   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 Persistent atrial fibrillation with RVR on eliquis - CHA2DS2-Vasc score 5 (HF, age, CAD, HTN, DM)  - continue metoprolol 50mg  TID. Digoxin 0.25mg  daily. Started on IV  amiodarone overnight, HR improved to 80s from 120s. Plan to convert to PO amiodarone tonight, discharge tomorrow if HR stable, 400mg  BID for 1 week, 200mg  BID for 1 month, then 200mg  daily thereafter.  - TEE/DCCV cancelled as pt received finished radiation therapy and has higher risk of esophageal perforation  - will need better compliance with eliquis. Problem this time is he was not tracking which medication he has been taking, and says he usually take a fistful of medication in AM. He will keep tracking of which medication he's taking.   2. Gastroesophageal CA s/p chemo 09/22/2015 and radiation 09/24/2015  3. CAD s/p CABG x 3 (LIMA to LAD, SVG to PDA, SVG to PL) - no angina sx  4. ICM with EF 30-35%  5. h/o VT s/p MDT ICD  6. Acute on chronic combined systolic and diastolic HF: euvolemic on exam, will convert to 40mg  PO lasix.  7. PAD 8. HTN 9. HLD 10. DM 11. OSA   Signed, Almyra Deforest PA-C Pager: [phone] Patient seen and examined and history reviewed. Agree with above findings and plan. Feeling well today. Less SOB. HR controlled well since starting IV amiodarone. Good diuresis. Now on oral lasix. Negative 5 liters since admission. TEE not performed yesterday due to concern about esophageal injury/perforation with recent RT. Plan to control rate with metoprolol, Dig, and amiodarone. Continue Eliquis. Plan on DC in am if rate controlled and then consider DCCV once we can be sure of anticoagulation for 3-4 weeks. Will educate patient on meds and make sure he is taking appropriately.  Peter [place], [place] 10/08/2015 1:51 PM

## 2015-10-09 LAB — BASIC METABOLIC PANEL
ANION GAP: 9 (ref 5–15)
BUN: 12 mg/dL (ref 6–20)
CO2: 29 mmol/L (ref 22–32)
Calcium: 8.6 mg/dL — ABNORMAL LOW (ref 8.9–10.3)
Chloride: 97 mmol/L — ABNORMAL LOW (ref 101–111)
Creatinine, Ser: 0.98 mg/dL (ref 0.61–1.24)
Glucose, Bld: 114 mg/dL — ABNORMAL HIGH (ref 65–99)
POTASSIUM: 3.2 mmol/L — AB (ref 3.5–5.1)
SODIUM: 135 mmol/L (ref 135–145)

## 2015-10-09 LAB — DIGOXIN LEVEL: DIGOXIN LVL: 0.5 ng/mL — AB (ref 0.8–2.0)

## 2015-10-09 MED ORDER — POTASSIUM CHLORIDE CRYS ER 20 MEQ PO TBCR
20.0000 meq | EXTENDED_RELEASE_TABLET | Freq: Once | ORAL | Status: AC
  Administered 2015-10-09: 20 meq via ORAL
  Filled 2015-10-09: qty 1

## 2015-10-09 MED ORDER — METOLAZONE 2.5 MG PO TABS
2.5000 mg | ORAL_TABLET | Freq: Once | ORAL | Status: AC
Start: 2015-10-09 — End: 2015-10-09
  Administered 2015-10-09: 2.5 mg via ORAL
  Filled 2015-10-09: qty 1

## 2015-10-09 MED ORDER — POTASSIUM CHLORIDE CRYS ER 20 MEQ PO TBCR
20.0000 meq | EXTENDED_RELEASE_TABLET | Freq: Two times a day (BID) | ORAL | Status: DC
  Administered 2015-10-09 – 2015-10-10 (×2): 20 meq via ORAL
  Filled 2015-10-09 (×2): qty 1

## 2015-10-09 MED ORDER — FUROSEMIDE 10 MG/ML IJ SOLN
40.0000 mg | Freq: Two times a day (BID) | INTRAMUSCULAR | Status: DC
Start: 2015-10-09 — End: 2015-10-10
  Administered 2015-10-09: 40 mg via INTRAVENOUS
  Filled 2015-10-09: qty 4

## 2015-10-09 MED ORDER — POTASSIUM CHLORIDE CRYS ER 20 MEQ PO TBCR
40.0000 meq | EXTENDED_RELEASE_TABLET | Freq: Once | ORAL | Status: AC
  Administered 2015-10-09: 40 meq via ORAL
  Filled 2015-10-09: qty 2

## 2015-10-09 NOTE — Progress Notes (Signed)
..  Advanced Heart Failure Rounding Note  PCP: Dr Charlcie Cradle Primary Cardiologist: Dr. Martinique Primary EP: Dr. Caryl Comes  Subjective:    Walter French is a 69 y.o. male with hx of chronic combined HF Echo 07/23/15 LVEF 30-35%, ICM,  CAD s/p CABG x 3 (LIMA to LAD, SVG to PDA, SVG to PL), h/o VT s/p MDT ICD, PAD, HTN, HLD, DM, OSA and gastroesophageal cancer s/p chemo and radiation therapy presented to [facility] with afib with RVR on 10/05/15. Admitted for TEE/DCCV. Pertient admission labs included K 3.4, Cr 0.8, and BNP 999. CXR showed stable left basilar atelectasis, mild cardiomegaly, mild pulmonary vascular congestion, and mild chronic interstitial lung disease.   Pt recently finished radiation therapy and has higher risk of esophageal perforation so TEE/DCCV will be postponed until later date. There is also a question to his compliance with Eliquis.  He has been on IV lasix up to 40 mg IV lasix BID and is out 4.5 L and down 13 lbs so far this admit. Creatinine stable. Weight far below any recent baseline weights.  He was thought to be stable for discharge today with return in 3-4 weeks for TEE/DCCV once stabilized on eliquis, but had been enrolled in ReDS Vest trial with reading of 42, where reading >39 prompts a HF team consult for recommendation of further diuresis.  Says he feels better since admit, but has been "short of breath his whole life."  Gets SOB with "straining" or lying flat.  States he can walk around [employer] without difficulty. Can't think of a specific activity that brings on SOB. Drinks a lot of fluid at home. Denies edema, lightheadedness, dizziness, or near syncope. 60 pack year smoking history. Chronic non-productive cough.  Wants to go home. Weights at home 228 most recently.    Objective:   Weight Range: 214 lb 4.8 oz (97.206 kg) Body mass index is 29.06 kg/(m^2).   Vital Signs:   Temp:  [98.5 F (36.9 C)-99 F (37.2 C)] 98.5 F (36.9 C) (10/28  0513) Pulse Rate:  [77-106] 106 (10/28 0900) Resp:  [21-26] 21 (10/28 0513) BP: (92-122)/(44-78) 105/59 mmHg (10/28 0900) SpO2:  [94 %-97 %] 97 % (10/28 0900) Weight:  [214 lb 4.8 oz (97.206 kg)] 214 lb 4.8 oz (97.206 kg) (10/28 0513) Last BM Date: 10/08/15  Weight change: Filed Weights   10/07/15 0428 10/08/15 0450 10/09/15 0513  Weight: 223 lb 1.7 oz (101.2 kg) 217 lb (98.431 kg) 214 lb 4.8 oz (97.206 kg)    Intake/Output:   Intake/Output Summary (Last 24 hours) at 10/09/15 1025 Last data filed at 10/09/15 0905  Gross per 24 hour  Intake    720 ml  Output    850 ml  Net   -130 ml     Physical Exam: General:  Elderly appearing HEENT: Normal except for stabismus Neck: supple. JVP 10 with HJR. Carotids 2+ bilat; no bruits. No lymphadenopathy or thyromegaly appreciated. Cor: PMI nondisplaced. Irregularly irregular. No rubs, gallops or murmurs appreciated Lungs: Diminished bibasilar, scattered rhonchi that clear with cough Abdomen: Obese, soft, NT, ND, No HSM. No bruits or masses. +BS Extremities: no cyanosis, clubbing, rash, edema Neuro: alert & orientedx3, cranial nerves grossly intact. moves all 4 extremities w/o difficulty. Affect pleasant  Telemetry: afib 70s  Labs: CBC No results for input(s): WBC, NEUTROABS, HGB, HCT, MCV, PLT in the last 72 hours. Basic Metabolic Panel  Recent Labs  10/08/15 0310 10/09/15 0335  NA 131* 135  K  3.2* 3.2*  CL 97* 97*  CO2 26 29  GLUCOSE 137* 114*  BUN 11 12  CREATININE 1.02 0.98  CALCIUM 8.0* 8.6*   Liver Function Tests No results for input(s): AST, ALT, ALKPHOS, BILITOT, PROT, ALBUMIN in the last 72 hours. No results for input(s): LIPASE, AMYLASE in the last 72 hours. Cardiac Enzymes No results for input(s): CKTOTAL, CKMB, CKMBINDEX, TROPONINI in the last 72 hours.  BNP: BNP (last 3 results)  Recent Labs  09/10/15 1844 09/30/15 2156 10/07/15 0258  BNP 254.6* 704.3* 999.3*    ProBNP (last 3 results) No results  for input(s): PROBNP in the last 8760 hours.   D-Dimer No results for input(s): DDIMER in the last 72 hours. Hemoglobin A1C No results for input(s): HGBA1C in the last 72 hours. Fasting Lipid Panel No results for input(s): CHOL, HDL, LDLCALC, TRIG, CHOLHDL, LDLDIRECT in the last 72 hours. Thyroid Function Tests No results for input(s): TSH, T4TOTAL, T3FREE, THYROIDAB in the last 72 hours.  Invalid input(s): FREET3  Other results:     Imaging/Studies:   No results found.  Latest Echo  Latest Cath   Medications:     Scheduled Medications: . allopurinol  300 mg Oral Daily  . amiodarone  400 mg Oral BID  . apixaban  5 mg Oral BID  . atorvastatin  40 mg Oral Daily  . digoxin  0.25 mg Oral Daily  . furosemide  40 mg Oral Daily  . guaiFENesin  600 mg Oral BID  . levothyroxine  75 mcg Oral QAC breakfast  . metoprolol tartrate  50 mg Oral TID  . potassium chloride  20 mEq Oral Daily  . saccharomyces boulardii  250 mg Oral BID  . sodium chloride  3 mL Intravenous Q12H     Infusions: . sodium chloride    . sodium chloride Stopped (10/07/15 0600)     PRN Medications:  acetaminophen, diphenhydrAMINE, nitroGLYCERIN, ondansetron (ZOFRAN) IV, prochlorperazine, sodium chloride   Assessment   1. Persistent Afib 2. Gastroesophagela CA: s/p chemo 09/22/15 and radiation 09/24/15 3. CAD s/p CABG x 3 (LIMA to LAD, SVG to PDA, SVG to PL) 4. Chronic combined CHF s/p [employer] ICD. Echo 07/23/15 LVEF 30-35% Mod-severe LV dysfunction 2/2 to ischemic cardiomyopathy 5. H/o VT 6. Hypokalemia  Plan   JVP 10 with HJR.  Pt in unblinded arm of ReDS vest study. Number 42 today so will hold for one day further of diuresis. Will use 40 mg lasix IV BID and 2.5 mg metolazone.  On rate control with lopressor, dig, and amio. Consider Toprol XL or bisoprolol with EF 30-35%  Florastor not recommended in Immunocompromised patients per pharm.  Will d/c.  Continue to supp K.  Length  of Stay: 3   Shirley Friar PA-C 10/09/2015, 10:25 AM  Advanced Heart Failure Team Pager [phone] (M-F; 7a - 4p)  Please contact [facility] for night-coverage after hours (4p -7a ) and weekends on [email or web address]    Patient seen and examined with Oda Kilts, PA-C. We discussed all aspects of the encounter. I agree with the assessment and plan as stated above.   Weight is lowest it has been in a long time and no evidence of peripheral volume overload. However CVP and Vest readings are up. Will proceed with one more day of IV diuresis with one dose of metolazone and reassess in am. Vest readings can be confounded by the presence of severe COPD.   Rethel Sebek,MD 5:38 PM

## 2015-10-09 NOTE — Care Management Important Message (Signed)
Important Message  Patient Details  Name: Walter French MRN: [id number] Date of Birth: 07-18-1946   Medicare Important Message Given:  Yes-second notification given    Delorse Lek 10/09/2015, 12:56 PM

## 2015-10-09 NOTE — Progress Notes (Signed)
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  Subjective: No complaints. He is chronically short of breath but noting worse than usual.  No chest pain. No syncope/ near syncope.   Objective: Vital signs in last 24 hours: Temp:  [97.9 F (36.6 C)-99 F (37.2 C)] 98.5 F (36.9 C) (10/28 0513) Pulse Rate:  [77-91] 91 (10/28 0513) Resp:  [21-26] 21 (10/28 0513) BP: (92-123)/(44-78) 92/44 mmHg (10/28 0513) SpO2:  [94 %-98 %] 94 % (10/28 0513) Weight:  [214 lb 4.8 oz (97.206 kg)] 214 lb 4.8 oz (97.206 kg) (10/28 0513) Last BM Date: 10/08/15  Intake/Output from previous day: 10/27 0701 - 10/28 0700 In: 840 [P.O.:840] Out: 1750 [Urine:1750] Intake/Output this shift:    Medications Current Facility-Administered Medications  Medication Dose Route Frequency Provider Last Rate Last Dose  . 0.9 %  sodium chloride infusion  250 mL Intravenous Continuous Almyra Deforest, PA   0 mL at 10/05/15 1900  . 0.9 %  sodium chloride infusion   Intravenous Continuous Eileen Stanford, PA-C   Stopped at 10/07/15 0600  . acetaminophen (TYLENOL) tablet 1,000 mg  1,000 mg Oral Q6H PRN Almyra Deforest, PA      . allopurinol (ZYLOPRIM) tablet 300 mg  300 mg Oral Daily Almyra Deforest, PA   300 mg at 10/08/15 1013  . amiodarone (PACERONE) tablet 400 mg  400 mg Oral BID Federica Allport M Martinique, MD   400 mg at 10/08/15 2142  . apixaban (ELIQUIS) tablet 5 mg  5 mg Oral BID Almyra Deforest, PA   5 mg at 10/08/15 2142  . atorvastatin (LIPITOR) tablet 40 mg  40 mg Oral Daily Almyra Deforest, [place]   40 mg at 10/08/15 1013  . digoxin (LANOXIN) tablet 0.25 mg  0.25 mg Oral Daily Almyra Deforest, PA   0.25 mg at 10/08/15 1012  . diphenhydrAMINE (BENADRYL) capsule 25 mg  25 mg Oral QHS PRN Hester Forget M Martinique, MD   25 mg at  10/08/15 2142  . furosemide (LASIX) tablet 40 mg  40 mg Oral Daily Almyra Deforest, [place]      . guaiFENesin (MUCINEX) 12 hr tablet 600 mg  600 mg Oral BID Almyra Deforest, PA   600 mg at 10/08/15 2142  . levothyroxine (SYNTHROID, LEVOTHROID) tablet 75 mcg  75 mcg Oral QAC breakfast Almyra Deforest, [place]   75 mcg at 10/09/15 0532  . metoprolol tartrate (LOPRESSOR) tablet 50 mg  50 mg Oral TID Almyra Deforest, PA   50 mg at 10/08/15 2142  . nitroGLYCERIN (NITROSTAT) SL tablet 0.4 mg  0.4 mg Sublingual Q5 Min x 3 PRN Almyra Deforest, PA      . ondansetron (ZOFRAN) injection 4 mg  4 mg Intravenous Q6H PRN Almyra Deforest, PA      . potassium chloride SA (K-DUR,KLOR-CON) CR tablet 20 mEq  20 mEq Oral Daily Lamona Eimer M Martinique, MD   20 mEq at 10/08/15 1713  . prochlorperazine (COMPAZINE) tablet 10 mg  10 mg Oral Q6H PRN Almyra Deforest, PA      . saccharomyces boulardii (FLORASTOR) capsule 250 mg  250 mg Oral BID Almyra Deforest, PA   250 mg at 10/08/15 2142  . sodium chloride 0.9 % injection 3  mL  3 mL Intravenous Q12H Almyra Deforest, PA   3 mL at 10/08/15 2143  . sodium chloride 0.9 % injection 3 mL  3 mL Intravenous PRN Almyra Deforest, PA        PE: General appearance: alert, cooperative and no distress Neck: no carotid bruit and no JVD Lungs: rhonchi bibasilar Heart: irregularly irregular rhythm and normal rate Extremities: no LEE Pulses: 2+ and symmetric Skin: warm and dry Neurologic: Grossly normal  Lab Results:  No results for input(s): WBC, HGB, HCT, PLT in the last 72 hours. BMET  Recent Labs  10/08/15 0310 10/09/15 0335  NA 131* 135  K 3.2* 3.2*  CL 97* 97*  CO2 26 29  GLUCOSE 137* 114*  BUN 11 12  CREATININE 1.02 0.98  CALCIUM 8.0* 8.6*     Assessment/Plan  Principal Problem:   Atrial fibrillation with RVR (HCC) Active Problems:   Coronary artery disease-status post CABG   Automatic implantable cardioverter-defibrillator in situ   Anemia of chronic disease   Type 2 diabetes mellitus with renal manifestations (HCC)   Acute on chronic  combined systolic (congestive) and diastolic (congestive) heart failure ([place])   1. Persistent atrial fibrillation:  - CHA2DS2-Vasc score 5 (HF, age, CAD, HTN, DM). Continue Eliquis - Rate is in the upper 90s-low 100s (improved from the 120s). He is tolerating well. Continue metoprolol 50mg  TID. Digoxin 0.25mg  daily and PO amiodarone, 400mg  BID for 1 week, 200mg  BID for 1 month, then 200mg  daily thereafter. - TEE/DCCV cancelled as pt received finished radiation therapy and has higher risk of esophageal perforation - Can consider DCCV only after 3-4 weeks of non interrupted Eliquis therapy if still in afib.  2. Gastroesophageal CA: s/p chemo 09/22/2015 and radiation 09/24/2015  3. CAD: s/p CABG x 3 (LIMA to LAD, SVG to PDA, SVG to PL) - no angina sx  4. ICM: with EF 30-35%. He has an ICD.   5. H/o VT: s/p MDT ICD  6. Chronic combined systolic and diastolic HF: enrolled in the ReDS Vest Discharge Study. Per measurements, his volume is a bit up. Will continue diuresis and will obtain a HF consult for further recommendations.   7. Hypokalemia: K is 3.2. Will give an extra 20 mEq of K-dur to total 40 mEq.     LOS: 3 days    Brittainy M. Ladoris Gene 10/09/2015 8:23 AM  Patient seen and examined and history reviewed. Agree with above findings and plan. Feeling well. HR is well controlled now. Breathing is better. His I/O are negative 4500 cc since admit. Weight is down 13 lbs. ReDs reading is 42 suggesting he still has lung fluid. Need to replete potassium. Will ask CHF team to see. May need additional diuresis prior to DC. Still plan to do DCCV in 3-4 weeks on Eliquis and amiodarone.   Ezelle Surprenant Martinique, [place] 10/09/2015 10:15 AM

## 2015-10-09 NOTE — Progress Notes (Signed)
ReDS Vest Discharge Study  Results of ReDS reading  Your patient is in the Unblinded arm of the Vest at Discharge study.    The ReDS reading is:  ( >39)  43  Your patient will not be discharged at this time and will have a AHF team consult.  Thank You   The research team

## 2015-10-10 LAB — BASIC METABOLIC PANEL
Anion gap: 11 (ref 5–15)
BUN: 10 mg/dL (ref 6–20)
CHLORIDE: 95 mmol/L — AB (ref 101–111)
CO2: 28 mmol/L (ref 22–32)
Calcium: 8.8 mg/dL — ABNORMAL LOW (ref 8.9–10.3)
Creatinine, Ser: 1.14 mg/dL (ref 0.61–1.24)
GFR calc non Af Amer: >60 mL/min (ref >60)
Glucose, Bld: 113 mg/dL — ABNORMAL HIGH (ref 65–99)
POTASSIUM: 3.8 mmol/L (ref 3.5–5.1)
SODIUM: 134 mmol/L — AB (ref 135–145)

## 2015-10-10 MED ORDER — POTASSIUM CHLORIDE CRYS ER 20 MEQ PO TBCR: 20.0000 meq | EXTENDED_RELEASE_TABLET | Freq: Every day | ORAL | Status: DC

## 2015-10-10 MED ORDER — METOPROLOL TARTRATE 50 MG PO TABS: 50.0000 mg | ORAL_TABLET | Freq: Three times a day (TID) | ORAL | Status: DC

## 2015-10-10 MED ORDER — FUROSEMIDE 40 MG PO TABS: 40.0000 mg | ORAL_TABLET | Freq: Every day | ORAL | Status: DC

## 2015-10-10 MED ORDER — AMIODARONE HCL 400 MG PO TABS: 400.0000 mg | ORAL_TABLET | Freq: Two times a day (BID) | ORAL | Status: DC

## 2015-10-10 NOTE — Progress Notes (Signed)
Patient alert oriented, no c/o pain or shortness of breath. V/S stable, iv and tele d/c, discharge instruction explain and given to the patient, all questions answered. D/c patient home per order.

## 2015-10-10 NOTE — Discharge Summary (Signed)
Physician Discharge Summary   Cardiologist:  Martinique  Patient ID: Walter French MRN: [id number] DOB/AGE: 1946-04-18 69 y.o.  Admit date: 10/05/2015 Discharge date: 10/10/2015  Admission Diagnoses: Atrial fibrillation with RVR [facility])  Discharge Diagnoses:  Principal Problem:   Atrial fibrillation with RVR ([place]) Active Problems:   Coronary artery disease-status post CABG   Automatic implantable cardioverter-defibrillator in situ   Anemia of chronic disease   Type 2 diabetes mellitus with renal manifestations (HCC)   Acute on chronic combined systolic (congestive) and diastolic (congestive) heart failure (HCC)   Discharged Condition: stable  Hospital Course:   69 yo male with PMH of CAD s/p CABG x 3 (LIMA to LAD, SVG to PDA, SVG to PL), ICM with EF 30-35%, h/o VT s/p MDT ICD, chronic combined systolic and diastolic HF, PAD, HTN, HLD, DM, OSA and 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 His last echocardiogram obtained on 07/23/2015 showed EF 26-37%, grade 2 diastolic dysfunction, PA peak pressure 34. He was admitted from 9/30-10/4 when he presented with weakness, dyspnea and hypoxia and was noted to be in new onset of atrial fibrillation with RVR. He was started on Eliquis on 10/2. Rate control with metoprolol has been limited by his blood pressure. He was also treated for sepsis. It appears patient was discharged in atrial fibrillation with goal rate control on metoprolol 50mg  TID and digoxin. He was last admitted with sepsis 2/2 bronchitis and UTI on 10/19-10/21. During the recent admission, his metoprolol 3 times a day dose was consolidated to 100 mg Toprol-XL on discharge which the patient has not been taking. According to the patient, he was told not to take Toprol-XL due to blood pressure issues.  Since discharge, he has been doing well, he denies any fever, chill. He does have chronic dry cough. He denies any significant palpitation or cardiac awareness of atrial  fibrillation. Unfortunately, patient is a poor historian, and does not know if he has been taking Eliquis. He was seen by his oncologist in the morning of 10/24 when noted patient was in atrial fibrillation with RVR. He also noted he was more short of breath this morning with exertion, it took him 2 hours to get dressed. He was sent to [facility] for further workup. He was given labetalol, after which the heart rate improved to 110 to 120s. Cardiology has been consulted for atrial fibrillation with RVR. Of note, after labetalol, patient's blood pressure briefly dipped down to 80s, however has since improved to low 100s.  The patient was admitted.  Lopressor resumed at 50 TID.  He was scheduled for TEE/DCCV which was postponed because anesthesiology was not available.  The patient became And tachycardic at 180 bpm. He was started on IV diltiazem and IV Lasix.  His TEE/DCC was rescheduled however, because the patient had just finished radiation therapy, he is at higher risk of esophageal perforation.  He was started on IV amiodarone and later switched to by mouth 400 mg twice a day. He will take this for one week and then be changed to 200 mg twice a day for a month and then once daily thereafter. Patient ultimately diuresed 5 L throughout his admission. He was later changed to by mouth Lasix 40 mg once daily +20 mEq of potassium. We'll check a basic metabolic panel on Wednesday, November 2. His blood pressures too low for an ACE-I or ARB.  We will reevaluate in the office. He was enrolled in the Vest study, and because of  this, stayed additional night.  He was given an additional dose of IV Lasix with metolazone. CHA2DS2-Vasc score 5 (HF, age, CAD, HTN, DM), and 2 new Eliquis. Will anticoagulate for 3-4 weeks then reconsider [facility] at that time.  The patient was seen by Dr. Haroldine Laws who felt he was stable for DC home.   Consults: None  Significant Diagnostic Studies:    Treatments: See  above  Discharge Exam: Blood pressure 105/67, pulse 71, temperature 98.6 F (37 C), temperature source Oral, resp. rate 20, height 6' (1.829 m), weight 207 lb 3.2 oz (93.985 kg), SpO2 100 %.   Disposition: ED Dismiss - Never Arrived      Discharge Instructions    Diet - low sodium heart healthy    Complete by:  As directed      Discharge instructions    Complete by:  As directed   Monitor your weight every morning.  If you gain 3 pounds in 24 hours, or 5 pounds in a week, call the office for instructions.  You need to have blood drawn on Nov 2.  Come to the office anytime in the morning.     Increase activity slowly    Complete by:  As directed             Medication List    STOP taking these medications        dexamethasone 4 MG tablet  Commonly known as:  DECADRON     levofloxacin 750 MG tablet  Commonly known as:  LEVAQUIN     metoprolol succinate 100 MG 24 hr tablet  Commonly known as:  TOPROL-XL     sucralfate 1 G tablet  Commonly known as:  CARAFATE     traMADol 50 MG tablet  Commonly known as:  ULTRAM      TAKE these medications        acetaminophen 500 MG tablet  Commonly known as:  TYLENOL  Take 1,000 mg by mouth every 6 (six) hours as needed for moderate pain or headache.     allopurinol 300 MG tablet  Commonly known as:  ZYLOPRIM  Take 300 mg by mouth daily.     amiodarone 400 MG tablet  Commonly known as:  PACERONE  Take 1 tablet (400 mg total) by mouth 2 (two) times daily.     apixaban 5 MG Tabs tablet  Commonly known as:  ELIQUIS  Take 1 tablet (5 mg total) by mouth 2 (two) times daily.     atorvastatin 40 MG tablet  Commonly known as:  LIPITOR  Take 1 tablet (40 mg total) by mouth daily.     digoxin 0.25 MG tablet  Commonly known as:  LANOXIN  Take 1 tablet (0.25 mg total) by mouth daily.     furosemide 40 MG tablet  Commonly known as:  LASIX  Take 1 tablet (40 mg total) by mouth daily.     glipiZIDE 10 MG tablet  Commonly  known as:  GLUCOTROL  Take 10 mg by mouth 2 (two) times daily before a meal.     guaiFENesin 600 MG 12 hr tablet  Commonly known as:  MUCINEX  Take 1 tablet (600 mg total) by mouth 2 (two) times daily.     hyaluronate sodium Gel  Apply 1 application topically as needed (treatment).     HYDROcodone-acetaminophen 5-325 MG tablet  Commonly known as:  NORCO/VICODIN  TAKE 1 TABLET BY MOUTH 4 TIMES DAILY AS NEEDED FOR PAIN  insulin aspart 100 UNIT/ML FlexPen  Commonly known as:  NOVOLOG FLEXPEN  Use the following scale to administer insulin depending on CBG              CBG 121 - 150: 2  units CBG 151 - 200:3 units CBG 201 - 250:5 units CBG 251 - 300:8 units CBG 301 - 350:11 units CBG 351 - 400:15 units     levothyroxine 75 MCG tablet  Commonly known as:  SYNTHROID, LEVOTHROID  Take 75 mcg by mouth daily before breakfast.     metoprolol 50 MG tablet  Commonly known as:  LOPRESSOR  Take 1 tablet (50 mg total) by mouth 3 (three) times daily.     nitroGLYCERIN 0.4 MG SL tablet  Commonly known as:  NITROSTAT  Place 1 tablet (0.4 mg total) under the tongue every 5 (five) minutes x 3 doses as needed for chest pain.     ondansetron 8 MG tablet  Commonly known as:  ZOFRAN  Take 1 tablet (8 mg total) by mouth every 8 (eight) hours as needed for nausea or vomiting.     potassium chloride SA 20 MEQ tablet  Commonly known as:  K-DUR,KLOR-CON  Take 1 tablet (20 mEq total) by mouth daily.     prochlorperazine 10 MG tablet  Commonly known as:  COMPAZINE  Take 1 tablet (10 mg total) by mouth every 6 (six) hours as needed for nausea or vomiting.     ranitidine 300 MG tablet  Commonly known as:  ZANTAC  Take 1 tablet (300 mg total) by mouth daily as needed for heartburn.     saccharomyces boulardii 250 MG capsule  Commonly known as:  FLORASTOR  Take 1 capsule (250 mg total) by mouth 2 (two) times daily.     VICTOZA 18 MG/3ML Sopn  Generic drug:  Liraglutide  Inject 1.8 mg  into the skin daily.       Follow-up Information    Follow up with Peter Martinique, MD.   Specialty:  Cardiology   Why:  The office will call you with your followup appt date and time.  The appt will likely be with a PA or Nurse Practioner.    Contact information:   [address] [place] [address] [phone]       Follow up with Labs On 10/14/2015.   Why:  Come to the office for labs.   Contact information:   [address] [place] [address] [phone]     Greater than 30 minutes was spent completing the patient's discharge.   SignedTarri Fuller, [facility] 10/10/2015, 11:15 AM  Patient seen and examined with Tarri Fuller, PA-C. We discussed all aspects of the encounter. I agree with the assessment and plan as stated above.   He uis stable for d/c. Continue above meds. Will need consideration of DC-CV after 4 weeks of anticoagulation.   Noor Vidales,MD 12:31 PM

## 2015-10-10 NOTE — Progress Notes (Signed)
ReDS Vest Discharge Study  Results of ReDS reading  Your patient is in the Unblinded arm of the Vest at Discharge study.  The ReDS reading is:   ( < 39) =  35  Your patient is ok for discharge.   Thank You   The research team

## 2015-10-10 NOTE — Progress Notes (Addendum)
Subjective: No SOB, orthopnea  Objective: Vital signs in last 24 hours: Temp:  [97.7 F (36.5 C)-98.4 F (36.9 C)] 97.7 F (36.5 C) (10/29 0400) Pulse Rate:  [83-106] 86 (10/29 0400) Resp:  [18] 18 (10/29 0400) BP: (93-110)/(52-60) 93/52 mmHg (10/29 0400) SpO2:  [97 %-98 %] 98 % (10/29 0400) Weight:  [207 lb 3.2 oz (93.985 kg)] 207 lb 3.2 oz (93.985 kg) (10/29 0400) Last BM Date: 10/09/15  Intake/Output from previous day: 10/28 0701 - 10/29 0700 In: 600 [P.O.:600] Out: 1000 [Urine:1000] Intake/Output this shift:    Medications Scheduled Meds: . allopurinol  300 mg Oral Daily  . amiodarone  400 mg Oral BID  . apixaban  5 mg Oral BID  . atorvastatin  40 mg Oral Daily  . digoxin  0.25 mg Oral Daily  . furosemide  40 mg Intravenous BID  . furosemide  40 mg Oral Daily  . guaiFENesin  600 mg Oral BID  . levothyroxine  75 mcg Oral QAC breakfast  . metoprolol tartrate  50 mg Oral TID  . potassium chloride  20 mEq Oral BID  . sodium chloride  3 mL Intravenous Q12H   Continuous Infusions: . sodium chloride    . sodium chloride Stopped (10/07/15 0600)   PRN Meds:.acetaminophen, diphenhydrAMINE, nitroGLYCERIN, ondansetron (ZOFRAN) IV, prochlorperazine, sodium chloride  PE: General appearance: alert, cooperative and no distress Lungs: + rhonchi, cleared with cough Heart: irregularly irregular rhythm and No MRG Extremities: No LEE Pulses: 2+ and symmetric Skin: Warm and dry Neurologic: Grossly normal  Lab Results:  No results for input(s): WBC, HGB, HCT, PLT in the last 72 hours. BMET  Recent Labs  10/08/15 0310 10/09/15 0335 10/10/15 0428  NA 131* 135 134*  K 3.2* 3.2* 3.8  CL 97* 97* 95*  CO2 26 29 28   GLUCOSE 137* 114* 113*  BUN 11 12 10   CREATININE 1.02 0.98 1.14  CALCIUM 8.0* 8.6* 8.8*      Assessment/Plan   Principal Problem:   Atrial fibrillation with RVR (HCC) Active Problems:   Coronary artery disease-status post CABG   Automatic  implantable cardioverter-defibrillator in situ   Anemia of chronic disease   Type 2 diabetes mellitus with renal manifestations (HCC)   Acute on chronic combined systolic (congestive) and diastolic (congestive) heart failure ([place])   Patient held one more day for additional diuresis due to Vest study reading.  He is laying flat.  No LEE or crackles.   Net fluids:  -0.4L/-5.1L.  Afib: Overall rate controlled.  Meds: lopressor 50 TID, dig, and amio 400mg  BID.  I not going to consolidate lopressor to Toprol because this was done before and the patient would not take it.  Decrease Amio to 400mg  daily after one week of BID.   Continue Eliquis.  Daily weight monitoring and when to call the office was discussed.  APP  Follow up in 1-2 weeks and then with Dr. Martinique.    LOS: 4 days    HAGER, BRYAN PA-C 10/10/2015 7:52 AM  Patient seen and examined with Tarri Fuller, PA-C. We discussed all aspects of the encounter. I agree with the assessment and plan as stated above.   Weight down 7 pounds over night. CVP now flat. Renal function stable. Can go home today. Continue amio and Eliquis. Will need DC-CV in 4 weeks. Was not on lasix at home. Start lasix 40 mg daily with kcl 20. BMET this week. BP too low for for ACE or ARB. Hopefully can  add losartan as outpatient.   Khali Perella,MD 9:08 AM

## 2015-10-12 ENCOUNTER — Telehealth: Payer: Self-pay | Admitting: Cardiology

## 2015-10-12 NOTE — Telephone Encounter (Signed)
Patient contacted regarding discharge from [facility] on 10/10/15.  Patient understands to follow up with provider Rosaria Ferries on 10/22/15 at 3:00pm at [facility]. Patient understands discharge instructions? Yes Patient understands medications and regiment? Yes Patient understands to bring all medications to this visit? Yes

## 2015-10-12 NOTE — Telephone Encounter (Signed)
D/C phone call . Appt is on 10/22/15 at 3pm w/ Rosaria Ferries at the [facility] office .   Thanks

## 2015-10-13 ENCOUNTER — Telehealth: Payer: Self-pay | Admitting: Cardiology

## 2015-10-13 DIAGNOSIS — I480 Paroxysmal atrial fibrillation: Secondary | ICD-10-CM

## 2015-10-13 MED ORDER — DIGOXIN 250 MCG PO TABS: 0.2500 mg | ORAL_TABLET | Freq: Every day | ORAL | Status: DC

## 2015-10-13 NOTE — Telephone Encounter (Signed)
I will send to Dr Martinique for review.  Pt has follow up appt with Armanda Heritage 10/22/15

## 2015-10-13 NOTE — Telephone Encounter (Signed)
Pt advised discharge summary 10/10/15 indicates he should be taking digoxin 0.25mg  daily, pt requesting a refill sent to [place]. Pt states he is scheduled for lab tomorrow at [facility]. Pt advised I do not see appt scheduled for 10/14/15, but discharge summary states pt needs BMET checked. Pt states he cannot come for lab tomorrow but can come on 10/15/15. Pt advised I have sent refill for digoxin to his pharmacy and lab appt scheduled for BMET 10/15/15 [facility].

## 2015-10-13 NOTE — Telephone Encounter (Signed)
Walter French called in wanting to know if the pt should still be taking Digoxin. She says it was given to him in the hospital but he has not been taking it while he has been home. Please f/u with her   Thanks

## 2015-10-14 ENCOUNTER — Telehealth: Payer: Self-pay

## 2015-10-14 NOTE — Telephone Encounter (Signed)
Attempting to do a PA online for Digitek 250 mcg, but I will need to call [facility].

## 2015-10-14 NOTE — Telephone Encounter (Signed)
Opened in error

## 2015-10-14 NOTE — Telephone Encounter (Signed)
Opened in eror

## 2015-10-14 NOTE — Telephone Encounter (Signed)
Prior auth for [place] 275mcg sent to [employer].

## 2015-10-15 ENCOUNTER — Other Ambulatory Visit: Payer: Self-pay | Admitting: Cardiology

## 2015-10-15 ENCOUNTER — Other Ambulatory Visit (INDEPENDENT_AMBULATORY_CARE_PROVIDER_SITE_OTHER): Payer: Medicare Other | Admitting: *Deleted

## 2015-10-15 ENCOUNTER — Telehealth: Payer: Self-pay | Admitting: Physician Assistant

## 2015-10-15 DIAGNOSIS — I480 Paroxysmal atrial fibrillation: Secondary | ICD-10-CM

## 2015-10-15 LAB — BASIC METABOLIC PANEL
BUN: 16 mg/dL (ref 7–25)
CHLORIDE: 97 mmol/L — AB (ref 98–110)
CO2: 25 mmol/L (ref 20–31)
Calcium: 8.9 mg/dL (ref 8.6–10.3)
Creat: 1.26 mg/dL — ABNORMAL HIGH (ref 0.70–1.25)
GLUCOSE: 221 mg/dL — AB (ref 65–99)
POTASSIUM: 4 mmol/L (ref 3.5–5.3)
SODIUM: 132 mmol/L — AB (ref 135–146)

## 2015-10-15 MED ORDER — RANITIDINE HCL 300 MG PO TABS: 300.0000 mg | ORAL_TABLET | Freq: Every day | ORAL | Status: DC | PRN

## 2015-10-15 NOTE — Addendum Note (Signed)
Addended by: Eulis Foster on: 10/15/2015 11:22 AM   Modules accepted: Orders

## 2015-10-15 NOTE — Telephone Encounter (Signed)
Received records from [place] for appointment on 10/22/15 with Rosaria Ferries, PA.  Records given to [employer] (medical records) for Rhonda's schedule on 10/22/15. lp

## 2015-10-20 ENCOUNTER — Other Ambulatory Visit: Payer: Self-pay

## 2015-10-20 MED ORDER — DIGOXIN 125 MCG PO TABS: 0.1250 mg | ORAL_TABLET | Freq: Every day | ORAL | Status: DC

## 2015-10-22 ENCOUNTER — Ambulatory Visit (INDEPENDENT_AMBULATORY_CARE_PROVIDER_SITE_OTHER): Payer: Medicare Other | Admitting: Physician Assistant

## 2015-10-22 ENCOUNTER — Encounter: Payer: Self-pay | Admitting: Physician Assistant

## 2015-10-22 ENCOUNTER — Other Ambulatory Visit: Payer: Self-pay

## 2015-10-22 VITALS — BP 92/66 | HR 131 | Ht 72.0 in | Wt 207.0 lb

## 2015-10-22 DIAGNOSIS — I4892 Unspecified atrial flutter: Secondary | ICD-10-CM | POA: Diagnosis not present

## 2015-10-22 DIAGNOSIS — Z79899 Other long term (current) drug therapy: Secondary | ICD-10-CM | POA: Diagnosis not present

## 2015-10-22 DIAGNOSIS — Z7901 Long term (current) use of anticoagulants: Secondary | ICD-10-CM

## 2015-10-22 DIAGNOSIS — E785 Hyperlipidemia, unspecified: Secondary | ICD-10-CM

## 2015-10-22 DIAGNOSIS — I255 Ischemic cardiomyopathy: Secondary | ICD-10-CM

## 2015-10-22 MED ORDER — FUROSEMIDE 40 MG PO TABS: 40.0000 mg | ORAL_TABLET | ORAL | Status: DC

## 2015-10-22 MED ORDER — AMIODARONE HCL 400 MG PO TABS: 400.0000 mg | ORAL_TABLET | Freq: Every day | ORAL | Status: DC

## 2015-10-22 NOTE — Patient Instructions (Signed)
Your physician recommends that you return for lab work TODAY.  Your physician has recommended you make the following change in your medication: decrease the amiodarone to 400 mg ONCE daily instead of twice daily. The furosemide has been changed to 1 tablet every other day.  Call 911 if increased symptoms.  Your physician recommends that you keep follow-up appointment in the heart failure clinic on 10/28/15 @ 9:20 am.

## 2015-10-22 NOTE — Progress Notes (Signed)
Cardiology Office Note   Date:  10/22/2015   ID:  Walter French, DOB 13-Sep-1946, MRN [id number]  PCP:  Karlene Einstein, MD  Cardiologist:  Dr Martinique  EP: Dr Suzie Portela, PA-C   Chief Complaint  Patient presents with  . post hospital follow up  . Dizziness    lasts for a few minutes    History of Present Illness: Walter French is a 69 y.o. 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   Admitted with sepsis 2/2 bronchitis and UTI on 10/19-10/21.  Admitted 10/24-10/29 after an admission for CHF and PAF/RVR, on Eliquis, possible DCCV in 3-4 weeks. TEE/DCCV was not performed during this hospitalization because of concerns about recent radiation therapy. His heart rate was difficult to control.  [name] presents for post hospital follow-up.  Since discharge from the hospital, Walter French has done very well. He gets short of breath with any exertion, but states he is fine at home because he goes from the bed to the chair and gets up at various times during the day but is doing almost nothing. Although he is still having dyspnea on exertion, his weight has stayed down, and the edema has gone. There may be some mild orthopnea, but he denies PND.  He denies palpitations or presyncope. He has no awareness that his heart beat is rapid or irregular. He has not had chest pain. He has not had lower extremity edema and denies orthopnea or PND.  He has gotten a little lightheaded a couple of times, but does not feel he is in danger of falling.   Past Medical History  Diagnosis Date  . Hyperlipidemia   . Hypertension   . Coronary heart disease     a. s/p CABG x 3 (VG->PDA, VG->PL, LIMA->LAD);  b. 2013 Abnl Myoview;  c. 2013 Cath: 3/3 patent grafts, occluded LCX which  correlated w/ ischemia on MV->not amenable to PCI->Med Rx.  . Obesity   . CHF (congestive heart failure) ([place])   . VT (ventricular tachycardia) ([place])   . SVT (supraventricular tachycardia) ([place])   . PAD (peripheral artery disease) ([place])     Angiography in February of 2014: Moderate left iliac artery stenosis, mild to moderate diffuse right SFA disease. Severe proximal right popliteal artery stenosis with three-vessel runoff below the knee. Status post self-expanding stent placement to the proximal popliteal artery  . Implantable cardioverter-defibrillator Mdt   . Asthma     "small touch" (07/29/2013)  . History of pneumonia     "3-4 times; last time ~ 2003" (07/29/2013)  . OSA on CPAP   . GERD (gastroesophageal reflux disease)   . H/O hiatal hernia   . Gout   . Anxiety   . Allergy   . Stomach cancer ([place]) 07/23/2015    invasive adenocarcinoma ,esophagus ,distal   . Type II diabetes mellitus ([place])     on insulin  . Sleep apnea   . Hyperlipidemia   . PAD (peripheral artery disease) ([place])   . CHF (congestive heart failure) ([place])   . A-fib ([place])   . CKD (chronic kidney disease), stage III     Past Surgical History  Procedure Laterality Date  . Tonsillectomy    . Cardiac catheterization    .  Wrist fracture surgery Right   . Cataract extraction w/ intraocular lens implant Left   . Cardiac defibrillator placement  1997; ~ 2003  . Coronary artery bypass graft  1997; 2007    "X3" (07/29/2013)  . Nerve, tendon and artery repair Left 09/20/2013    Procedure: IRRIGATION AND DEBRIDEMENT,Exploration and Repair of Ulnar/Digital  Artery Nerve of Left Index finger;  Surgeon: Roseanne Kaufman, MD;  Location: [facility];  Service: Orthopedics;  Laterality: Left;  . Abdominal aortagram N/A 01/30/2013    Procedure: ABDOMINAL Maxcine Ham;  Surgeon: Wellington Hampshire, MD;  Location: [facility] C[facility];  Service: Cardiovascular;  Laterality: N/A;  . Cardiac catheterization N/A 07/22/2015    Procedure: Left Heart Cath  and Coronary Angiography;  Surgeon: Peter M Martinique, MD;  Location: [facility] CV LAB;  Service: Cardiovascular;  Laterality: N/A;  . Esophagogastroduodenoscopy N/A 07/23/2015    Procedure: ESOPHAGOGASTRODUODENOSCOPY (EGD);  Surgeon: Wonda Horner, MD;  Location: [facility] ENDOSCOPY;  Service: Endoscopy;  Laterality: N/A;  . Tee without cardioversion N/A 10/06/2015    Procedure: TRANSESOPHAGEAL ECHOCARDIOGRAM (TEE);  Surgeon: Pixie Casino, MD;  Location: [facility] ENDOSCOPY;  Service: Cardiovascular;  Laterality: N/A;    Current Outpatient Prescriptions  Medication Sig Dispense Refill  . acetaminophen (TYLENOL) 500 MG tablet Take 1,000 mg by mouth every 6 (six) hours as needed for moderate pain or headache.    . allopurinol (ZYLOPRIM) 300 MG tablet Take 300 mg by mouth daily.      [name] amiodarone (PACERONE) 400 MG tablet Take 1 tablet (400 mg total) by mouth 2 (two) times daily. 30 tablet 5  . apixaban (ELIQUIS) 5 MG TABS tablet Take 1 tablet (5 mg total) by mouth 2 (two) times daily. (Patient not taking: Reported on 10/05/2015) 60 tablet 0  . atorvastatin (LIPITOR) 40 MG tablet Take 1 tablet (40 mg total) by mouth daily. 90 tablet 3  . digoxin (LANOXIN) 0.125 MG tablet Take 1 tablet (0.125 mg total) by mouth daily. 30 tablet 6  . furosemide (LASIX) 40 MG tablet Take 1 tablet (40 mg total) by mouth daily. 30 tablet 11  . glipiZIDE (GLUCOTROL) 10 MG tablet Take 10 mg by mouth 2 (two) times daily before a meal.     . guaiFENesin (MUCINEX) 600 MG 12 hr tablet Take 1 tablet (600 mg total) by mouth 2 (two) times daily. 40 tablet 0  . hyaluronate sodium (RADIAPLEXRX) GEL Apply 1 application topically as needed (treatment).     [name] HYDROcodone-acetaminophen (NORCO/VICODIN) 5-325 MG per tablet TAKE 1 TABLET BY MOUTH 4 TIMES DAILY AS NEEDED FOR PAIN (Patient not taking: Reported on 09/30/2015) 60 tablet 0  . insulin aspart (NOVOLOG FLEXPEN) 100 UNIT/ML FlexPen  Use the following scale to administer insulin depending on  CBG              CBG 121 - 150:  2  units    CBG 151 - 200: 3 units    CBG 201 - 250: 5 units    CBG 251 - 300: 8 units    CBG 301 - 350: 11 units    CBG 351 - 400: 15 units 15 mL 1  . levothyroxine (SYNTHROID, LEVOTHROID) 75 MCG tablet Take 75 mcg by mouth daily before breakfast.   0  . Liraglutide (VICTOZA) 18 MG/3ML SOPN Inject 1.8 mg into the skin daily.    . metoprolol (LOPRESSOR) 50 MG tablet Take 1 tablet (50 mg total) by mouth 3 (three) times daily. 90 tablet 11  .  nitroGLYCERIN (NITROSTAT) 0.4 MG SL tablet Place 1 tablet (0.4 mg total) under the tongue every 5 (five) minutes x 3 doses as needed for chest pain. 25 tablet 3  . ondansetron (ZOFRAN) 8 MG tablet Take 1 tablet (8 mg total) by mouth every 8 (eight) hours as needed for nausea or vomiting. (Patient not taking: Reported on 10/05/2015) 20 tablet 0  . potassium chloride SA (K-DUR,KLOR-CON) 20 MEQ tablet Take 1 tablet (20 mEq total) by mouth daily. 30 tablet 11  . prochlorperazine (COMPAZINE) 10 MG tablet Take 1 tablet (10 mg total) by mouth every 6 (six) hours as needed for nausea or vomiting. 30 tablet 1  . ranitidine (ZANTAC) 300 MG tablet Take 1 tablet (300 mg total) by mouth daily as needed for heartburn. 30 tablet 7  . saccharomyces boulardii (FLORASTOR) 250 MG capsule Take 1 capsule (250 mg total) by mouth 2 (two) times daily. 60 capsule 0   No current facility-administered medications for this visit.    Allergies:   Levaquin; Adhesive; and Morphine and related    Social History:  The patient  reports that he quit smoking about 19 years ago. His smoking use included Cigarettes. He has a 60 pack-year smoking history. He has never used smokeless tobacco. He reports that he does not drink alcohol or use illicit drugs.   Family History:  [name] family history includes Breast cancer in his mother; Cancer in his brother; Heart disease in his brother.    ROS:  Please see the history of present illness. All  other systems are reviewed and negative.    PHYSICAL EXAM: VS:  BP 92/66 mmHg  Pulse 131  Ht 6' (1.829 m)  Wt 207 lb (93.895 kg)  BMI 28.07 kg/m2 , BMI Body mass index is 28.07 kg/(m^2). GEN: Well nourished, well developed, male in no acute distress HEENT: normal for age  Neck: Minimal JVD, no carotid bruit, no masses Cardiac: Rapid and irregular; no murmur, no rubs, or gallops Respiratory: Decreased breath sounds bases bilaterally, normal work of breathing GI: soft, nontender, nondistended, + BS MS: no deformity or atrophy; no edema; distal pulses are 2+ in all 4 extremities  Skin: warm and dry, no rash Neuro:  Strength and sensation are intact Psych: euthymic mood, full affect   EKG:  EKG is ordered today. The ekg ordered today demonstrates atrial flutter, rapid ventricular response, heart rate 131   Recent Labs: 09/10/2015: TSH 0.461 10/01/2015: ALT 9* 10/02/2015: Magnesium 2.2 10/05/2015: Hemoglobin 9.7*; Platelets 116* 10/07/2015: B Natriuretic Peptide 999.3* 10/15/2015: BUN 16; Creat 1.26*; Potassium 4.0; Sodium 132*    Lipid Panel    Component Value Date/Time   CHOL 138 07/30/2013 0630   TRIG 321* 07/30/2013 0630   HDL 25* 07/30/2013 0630   CHOLHDL 5.5 07/30/2013 0630   VLDL 64* 07/30/2013 0630   LDLCALC 49 07/30/2013 0630     Wt Readings from Last 3 Encounters:  10/22/15 207 lb (93.895 kg)  10/10/15 207 lb 3.2 oz (93.985 kg)  10/05/15 227 lb 3.2 oz (103.057 kg)     Other studies Reviewed: Additional studies/ records that were reviewed today include: Hospital records, previous ECGs.  ASSESSMENT AND PLAN:  1.  Atrial fibrillation and atrial flutter with rapid ventricular response: He is compliant with his medications. His blood pressure is too low to allow any increase. He is still taking his amiodarone 400 mg twice a day. We will decrease this to 400 mg daily. Consideration can be given to decreasing it  further, to 200 mg daily when he is  cardioverted.  His heart rate is elevated and admission to the hospital was discussed. The patient is extremely reluctant to do this because he feels he spent most of the mother October in the hospital. He states he is doing fine and wishes to wait until he can just be cardioverted. Because of his history of radiation therapy he is very reluctant to undergo a TEE. The need for him to call 911 and come immediately to the hospital if his respiratory status gets worse, he gets weaker or he begins to feel worse in some way was emphasized. He is in agreement with this, but basically feels that if he can maintain his current condition until the cardioversion, he can just wait for that as an outpatient.    He has an appointment on the 16th with the heart failure clinic. At that time, hopefully cardioversion can be scheduled.  2. Chronic anticoagulation with Eliquis: He states he is compliant with this medication and has not missed any doses CHA2DS2-Vasc score 5 (HF, age, CAD, HTN, DM)  3. His nail beds are pale and capillary refill is delayed. We will check a CBC, a BMET and a dig level today  Current medicines are reviewed at length with the patient today.  The patient does not have concerns regarding medicines.  The following changes have been made:  no change  Labs/ tests ordered today include:   Orders Placed This Encounter  Procedures  . CBC  . Digoxin level  . Basic metabolic panel  . EKG 12-Lead     Disposition:   FU with Dr. Martinique  Signed, Lenoard Aden  10/22/2015 5:25 PM    [facility] [address], [place], [place]  [address] Phone: [phone]; Fax: [phone]

## 2015-10-23 ENCOUNTER — Other Ambulatory Visit: Payer: Self-pay | Admitting: *Deleted

## 2015-10-23 ENCOUNTER — Other Ambulatory Visit: Payer: Self-pay | Admitting: Physician Assistant

## 2015-10-23 ENCOUNTER — Encounter: Payer: Self-pay | Admitting: *Deleted

## 2015-10-23 ENCOUNTER — Ambulatory Visit (HOSPITAL_BASED_OUTPATIENT_CLINIC_OR_DEPARTMENT_OTHER): Payer: Medicare Other | Admitting: Oncology

## 2015-10-23 VITALS — BP 103/55 | HR 94 | Resp 20 | Ht 72.0 in | Wt 208.4 lb

## 2015-10-23 DIAGNOSIS — C155 Malignant neoplasm of lower third of esophagus: Secondary | ICD-10-CM

## 2015-10-23 DIAGNOSIS — E119 Type 2 diabetes mellitus without complications: Secondary | ICD-10-CM | POA: Diagnosis not present

## 2015-10-23 DIAGNOSIS — C160 Malignant neoplasm of cardia: Secondary | ICD-10-CM

## 2015-10-23 DIAGNOSIS — I509 Heart failure, unspecified: Secondary | ICD-10-CM | POA: Diagnosis not present

## 2015-10-23 LAB — CBC
HCT: 30.7 % — ABNORMAL LOW (ref 39.0–52.0)
HEMOGLOBIN: 9.8 g/dL — AB (ref 13.0–17.0)
MCH: 28.4 pg (ref 26.0–34.0)
MCHC: 31.9 g/dL (ref 30.0–36.0)
MCV: 89 fL (ref 78.0–100.0)
MPV: 10.4 fL (ref 8.6–12.4)
PLATELETS: 157 10*3/uL (ref 150–400)
RBC: 3.45 MIL/uL — AB (ref 4.22–5.81)
RDW: 19.9 % — ABNORMAL HIGH (ref 11.5–15.5)
WBC: 6.4 10*3/uL (ref 4.0–10.5)

## 2015-10-23 LAB — BASIC METABOLIC PANEL
BUN: 12 mg/dL (ref 7–25)
CALCIUM: 9 mg/dL (ref 8.6–10.3)
CO2: 22 mmol/L (ref 20–31)
Chloride: 102 mmol/L (ref 98–110)
Creat: 1.15 mg/dL (ref 0.70–1.25)
Glucose, Bld: 75 mg/dL (ref 65–99)
POTASSIUM: 4.9 mmol/L (ref 3.5–5.3)
SODIUM: 136 mmol/L (ref 135–146)

## 2015-10-23 NOTE — Progress Notes (Signed)
Dr. Benay Spice has requested to add CEA to his lab draw today at cardiology lab. Attempted to reach PA at [phone] and this was main #. On hold several minutes without answer. Called the [facility] office at [phone] and was told the lab is on 1st floor [employer]) and phone [phone]. Attempted to reach lab to add CEA order-was only able to leave voice mail. Left VM with my contact # and request to add CEA to his lab draw today. Requested return call to confirm this was done or if they could not add it. Patient has a prescription in his hand for CBC/diff. Would be glad to fax new order to them if they provide fax #.

## 2015-10-23 NOTE — Progress Notes (Signed)
Spoke with Maryann Conners w/[facility] lab that patient went to today. They are able to run the lab test with additional order. Faxed order for CEA to [phone] to have results faxed to Dr. Benay Spice.

## 2015-10-23 NOTE — Progress Notes (Signed)
  [facility] OFFICE PROGRESS NOTE   Diagnosis: Esophagus cancer  INTERVAL HISTORY:   Mr. Tesfay returns as scheduled. He was admitted 10/05/2015 with rapid atrial fibrillation. His medical regimen was adjusted. He is being scheduled for a cardioversion procedure.  He reports resolution of subxiphoid pain. He has mild solid dysphagia.  Objective:  Vital signs in last 24 hours:  Blood pressure 103/55, pulse 94, resp. rate 20, height 6' (1.829 m), weight 208 lb 6.4 oz (94.53 kg), SpO2 96 %.    HEENT: Neck without mass Lymphatics: No cervical, supra-clavicular, or axillary nodes Resp: Lungs clear bilaterally Cardio: Irregular GI: No hepatomegaly Vascular: No leg edema   Lab Results:  Lab Results  Component Value Date   WBC 3.7* 10/05/2015   HGB 9.7* 10/05/2015   HCT 29.0* 10/05/2015   MCV 84.8 10/05/2015   PLT 116* 10/05/2015   NEUTROABS 1.9 10/05/2015      Lab Results  Component Value Date   CEA 17.2* 08/13/2015     Medications: I have reviewed the patient's current medications.  Assessment/Plan: 1. Adenocarcinoma the gastroesophageal junction, status post an endoscopic biopsy of a distal esophagus mass and gastric cardia ulcer on 07/23/2015  Staging CT scans consistent with locally metastatic adenopathy  PET scan 08/07/2015 with hypermetabolic mass at the distal esophagus/GE junction and a hypermetabolic paraesophageal, gastrohepatic, and portacaval lymph nodes  Initiation of radiation 08/12/2015 with completion 09/24/2015; cycle 1 Taxol/carboplatin 08/13/2015; Final cycle given 09/22/2015.  2. History of coronary artery disease 3. Congestive heart failure 4. History of ventricular tachycardia and supraventricular tachycardia 5. Peripheral arterial disease 6. Implantable defibrillator 7. Diabetes 8. Gout 9. Hospitalization from 09/10/15 to 09/15/15 due to Acute Hypoxic Respiratory Failure and Sepsis as well as new onset of Atrial  Fibrillation, 10. Hospitalization 09/30/2015 through 10/02/2015 with probable UTI/bronchitis. 11. Hospitalization 10/05/2015 with rapid atrial fibrillation   Disposition:  Walter French completed concurrent chemotherapy and radiation for treatment of adenocarcinoma of the gastroesophageal junction. He does not have symptoms related to cancer present. He is not a surgical candidate. I do not recommend restaging CT scans unless he develops clinical evidence of disease progression. He continues close follow-up with cardiology for management of arrhythmias and CHF. He will return for an office visit here in 3 months.  We will check a CEA and CBC when he has a lab visit with cardiology.  Betsy Coder, MD  10/23/2015  9:40 AM

## 2015-10-23 NOTE — Progress Notes (Signed)
Oncology Nurse Navigator Documentation  Oncology Nurse Navigator Flowsheets 10/23/2015  Referral date to RadOnc/MedOnc -  Navigator Encounter Type 3 month  Patient Visit Type Medonc  Treatment Phase Treatment completed--  Barriers/Navigation Needs -  Education Other--suggested calling [employer] to determine cost to help chew food and ease swallowing  Interventions -  Referrals -  Education Method Verbal  Support Groups/Services -  Time Spent with Patient 30  Doing well from oncology standpoint. Having issues with heart at present. Scheduled for cardioversion on 10/28/15.

## 2015-10-24 LAB — DIGOXIN LEVEL

## 2015-10-25 ENCOUNTER — Telehealth: Payer: Self-pay | Admitting: Oncology

## 2015-10-25 NOTE — Telephone Encounter (Signed)
Called and left a message with 24month follow up

## 2015-10-26 ENCOUNTER — Encounter: Payer: Self-pay | Admitting: Radiation Oncology

## 2015-10-28 ENCOUNTER — Ambulatory Visit (HOSPITAL_COMMUNITY)
Admission: RE | Admit: 2015-10-28 | Discharge: 2015-10-28 | Disposition: A | Discharge status: Home / Self Care | Payer: Medicare Other | Source: Ambulatory Visit | Attending: Cardiology | Admitting: Cardiology

## 2015-10-28 ENCOUNTER — Encounter (HOSPITAL_COMMUNITY): Payer: Self-pay

## 2015-10-28 VITALS — BP 108/64 | HR 120 | Wt 206.0 lb

## 2015-10-28 DIAGNOSIS — I5042 Chronic combined systolic (congestive) and diastolic (congestive) heart failure: Secondary | ICD-10-CM

## 2015-10-28 DIAGNOSIS — I2510 Atherosclerotic heart disease of native coronary artery without angina pectoris: Secondary | ICD-10-CM | POA: Diagnosis not present

## 2015-10-28 DIAGNOSIS — I4892 Unspecified atrial flutter: Secondary | ICD-10-CM

## 2015-10-28 DIAGNOSIS — I5022 Chronic systolic (congestive) heart failure: Secondary | ICD-10-CM | POA: Diagnosis not present

## 2015-10-28 DIAGNOSIS — G4733 Obstructive sleep apnea (adult) (pediatric): Secondary | ICD-10-CM | POA: Diagnosis not present

## 2015-10-28 DIAGNOSIS — I4891 Unspecified atrial fibrillation: Secondary | ICD-10-CM

## 2015-10-28 DIAGNOSIS — I481 Persistent atrial fibrillation: Secondary | ICD-10-CM | POA: Diagnosis present

## 2015-10-28 MED ORDER — FUROSEMIDE 40 MG PO TABS: 40.0000 mg | ORAL_TABLET | Freq: Every day | ORAL | Status: DC

## 2015-10-28 MED ORDER — METOPROLOL SUCCINATE ER 50 MG PO TB24: 50.0000 mg | ORAL_TABLET | Freq: Two times a day (BID) | ORAL | Status: DC

## 2015-10-28 MED ORDER — VALSARTAN 160 MG PO TABS: 160.0000 mg | ORAL_TABLET | Freq: Every evening | ORAL | Status: DC

## 2015-10-28 NOTE — Addendum Note (Signed)
Encounter addended by: Larey Dresser, MD on: 10/28/2015  9:57 PM<BR>     Documentation filed: Problem List, Visit Diagnoses, Follow-up Section, LOS Section, Clinical Notes

## 2015-10-28 NOTE — Patient Instructions (Signed)
Stop Metoprolol Tartrate   Start Metoprolol Succinate (Toprol XL) 50 mg Twice daily   Change Valsartan to take EVERY EVENING  Increase Furosemide (Lasix) to 40 mg DAILY  Take Eliquis Twice daily   Your physician recommends that you schedule a follow-up appointment in: about 10 days

## 2015-10-28 NOTE — Progress Notes (Signed)
Patient ID: Walter French, male   DOB: 07-30-1946, 69 y.o.   MRN: [id number] PCP: Dr. Thurston Hole Cardiology: Dr. Martinique  69 yo with history of CAD s/p CABG, chronic systolic CHF (ischemic cardiomyopathy), adenocarcinoma of the GE junction (recently treated) and persistent atrial fibrillation presents for CHF clinic evaluation.  Last echo in 8/16 showed EF 30-35%.  LHC in 8/16 showed patent grafts and occluded native vessels, ongoing medical management.  In 8/16, he was diagnosed with adenocarcinoma of the GE junction and has been treated with chemotherapy and radiation.  In 9/16, he developed persistent atrial fibrillation.  He went to the EF with dyspnea and rapid afib in 10/16.  He was difficult to rate control, medical management was limited by low blood pressure.  TEE/DCCV was not done due to concern about recent radiation to the esophagus.  He did get IV diuresis in the hospital.  Currently, patient remains in atrial fibrillation.  Rate is poorly controlled, HR in the 110s today.  He feels palpitations.  He says that he has "always been short of breath."  However, this is worse recently.  He is dyspneic walking up any incline or steps.   He tends to do ok for short distances on flat ground.  No chest pain. Occasional lightheadedness when he first stands up.    He thought he was coming to this office today for a cardioversion.  However, he tells me that he has only been taking his Eliquis once a day (has never taken it bid).  He is also only taking metoprolol tartrate once a day.  Lasix was recently cut back to every other day.   Optivol was assessed: Fluid index > threshold since 9/16 with gradual downtrend in thoracic impedance.   Labs (11/16): K 4.9, creatinine 1.15, HCT 30.7, digoxin < 0.5  ECG: atrial fibrillation at 117, poor RWP  1. CAD: s/p CABG.  LHC (8/16) with totally occluded RCA, LCx, and LAD.  Patent LIMA-LAD with 50% dLAD stenosis after anastomosis, patent SVG-PLOM, patent SVG-PDA.    2. Chronic systolic CHF: Ischemic cardiomyopathy.  Echo (8/16) with EF 30-35%, moderate LVH, grade II diastolic dysfunction.  Medtronic ICD. 3. H/o VT 4. Atrial fibrillation: Persistent.  5. Gout 6. Type II diabetes 7. OSA: Uses CPAP 8. Hyperlipidemia 9. HTN 10. CKD  11. Adenocarcinoma of the GE junction: Diagnosed 8/16.  - CT and PET showed locally metastatic (adenopathy).  - Radiation 8/16-10/16. - Taxol/carboplatin 9/16-10/16 - Not a surgical candidate.  12. PAD: Angiography 2/14 with moderate left iliac stenosis and severe proximal right popliteal stenosis with PCI to right popliteal vein.   Social History   Social History  . Marital Status: Single    Spouse Name: N/A  . Number of Children: 0  . Years of Education: N/A   Occupational History  . retired Surveyor, mining   .     Social History Main Topics  . Smoking status: Former Smoker -- 2.00 packs/day for 30 years    Types: Cigarettes    Quit date: 12/13/1995  . Smokeless tobacco: Never Used  . Alcohol Use: No     Comment: 07/29/2013 "not touched any alcohol since 1997"  . Drug Use: No  . Sexual Activity: Not Currently   Other Topics Concern  . Not on file   Social History Narrative   Single, never married   Retired Administrator   Close friend, [name] and her children are his main support system   Independent in ADLs.  Family History  Problem Relation Age of Onset  . Heart disease Brother   . Cancer Brother   . Breast cancer Mother    ROS: All systems reviewed and negative except as per HPI.   Current Outpatient Prescriptions  Medication Sig Dispense Refill  . acetaminophen (TYLENOL) 500 MG tablet Take 1,000 mg by mouth every 6 (six) hours as needed for moderate pain or headache.    . allopurinol (ZYLOPRIM) 300 MG tablet Take 300 mg by mouth daily.      [name] amiodarone (PACERONE) 400 MG tablet Take 1 tablet (400 mg total) by mouth daily. 30 tablet 5  . apixaban (ELIQUIS) 5 MG TABS tablet Take  1 tablet (5 mg total) by mouth 2 (two) times daily. 60 tablet 0  . atorvastatin (LIPITOR) 40 MG tablet Take 1 tablet (40 mg total) by mouth daily. 90 tablet 3  . digoxin (LANOXIN) 0.125 MG tablet Take 1 tablet (0.125 mg total) by mouth daily. 30 tablet 6  . furosemide (LASIX) 40 MG tablet Take 1 tablet (40 mg total) by mouth daily. 30 tablet 3  . glipiZIDE (GLUCOTROL) 10 MG tablet Take 10 mg by mouth 2 (two) times daily before a meal.     . guaiFENesin (MUCINEX) 600 MG 12 hr tablet Take 1 tablet (600 mg total) by mouth 2 (two) times daily. 40 tablet 0  . levothyroxine (SYNTHROID, LEVOTHROID) 75 MCG tablet Take 75 mcg by mouth daily before breakfast.   0  . potassium chloride SA (K-DUR,KLOR-CON) 20 MEQ tablet Take 1 tablet (20 mEq total) by mouth daily. 30 tablet 11  . ranitidine (ZANTAC) 300 MG tablet Take 1 tablet (300 mg total) by mouth daily as needed for heartburn. 30 tablet 7  . saccharomyces boulardii (FLORASTOR) 250 MG capsule Take 1 capsule (250 mg total) by mouth 2 (two) times daily. 60 capsule 0  . valsartan (DIOVAN) 160 MG tablet Take 1 tablet (160 mg total) by mouth every evening.  1  . hyaluronate sodium (RADIAPLEXRX) GEL Apply 1 application topically as needed (treatment).     . metoprolol succinate (TOPROL-XL) 50 MG 24 hr tablet Take 1 tablet (50 mg total) by mouth 2 (two) times daily. Take with or immediately following a meal. 60 tablet 3  . nitroGLYCERIN (NITROSTAT) 0.4 MG SL tablet Place 1 tablet (0.4 mg total) under the tongue every 5 (five) minutes x 3 doses as needed for chest pain. (Patient not taking: Reported on 10/28/2015) 25 tablet 3  . ondansetron (ZOFRAN) 8 MG tablet Take 1 tablet (8 mg total) by mouth every 8 (eight) hours as needed for nausea or vomiting. (Patient not taking: Reported on 10/28/2015) 20 tablet 0  . prochlorperazine (COMPAZINE) 10 MG tablet Take 1 tablet (10 mg total) by mouth every 6 (six) hours as needed for nausea or vomiting. (Patient not taking:  Reported on 10/28/2015) 30 tablet 1   No current facility-administered medications for this encounter.   BP 108/64 mmHg  Pulse 120  Wt 206 lb (93.441 kg)  SpO2 98% General: NAD Neck: JVP 10 cm, no thyromegaly or thyroid nodule.  Lungs: Crackles at bases (mild) CV: Nondisplaced PMI.  Heart tachy, irregular S1/S2, no S3/S4, no murmur.  1+ ankle edema.  No carotid bruit.  Difficult to palpate pedal pulses.  Abdomen: Soft, nontender, no hepatosplenomegaly, no distention.  Skin: Intact without lesions or rashes.  Neurologic: Alert and oriented x 3.  Psych: Normal affect. Extremities: No clubbing or cyanosis.  HEENT: Normal.  Assessment/Plan:  1. Atrial fibrillation: Persistent. Rate is poorly controlled.  He is not a good candidate for TEE-guided DCCV given recent esophageal radiation.  Unfortunately, he has only been taking his Eliquis once a day so is not a good candidate for DCCV without TEE at this time.  He has only been taking metoprolol tartrate once a day.  - He now understands to take Eliquis twice a day.  Will plan DCCV after 4 wks of bid Eliquis.  - Stop metoprolol tartrate, start Toprol XL 50 mg bid.  - Continue digoxin, recent level was ok.  - Continue amiodarone pre-DCCV. Will need to follow LFTs and TSH.  He will need regular eye exams with this medication.  2. Chronic systolic CHF: Ischemic cardiomyopathy.  He is volume overloaded on exam and by Optivol.  - Increase Lasix to 40 mg daily.  - As above, will use Toprol XL rather than metoprolol tartrate.  - Given occasional orthostatic symptoms, will have him take his valsaratn 160 mg daily in the evening rather than in the morning.  - Repeat BMET in 10 days.  3. OSA: Uses CPAP.  4. CAD: Patent grafts on 8/16 cath.  Continue atorvastatin 40 mg daily.  Given stable CAD with Eliquis use, he is not on ASA.   Followup in 10-14 days.   Loralie Champagne 10/28/2015

## 2015-10-30 ENCOUNTER — Encounter: Payer: Self-pay | Admitting: *Deleted

## 2015-10-30 ENCOUNTER — Ambulatory Visit
Admission: RE | Admit: 2015-10-30 | Discharge: 2015-10-30 | Disposition: A | Discharge status: Home / Self Care | Payer: Medicare Other | Source: Ambulatory Visit | Attending: Radiation Oncology | Admitting: Radiation Oncology

## 2015-10-30 ENCOUNTER — Encounter: Payer: Self-pay | Admitting: Radiation Oncology

## 2015-10-30 VITALS — BP 78/47 | HR 80 | Temp 98.6°F | Resp 22 | Ht 72.0 in | Wt 205.1 lb

## 2015-10-30 DIAGNOSIS — C160 Malignant neoplasm of cardia: Secondary | ICD-10-CM

## 2015-10-30 HISTORY — DX: Personal history of irradiation: Z92.3

## 2015-10-30 NOTE — Progress Notes (Signed)
Obtained CEA results from [facility] lab: CEA=1.0 Forwarded to MD desk.

## 2015-10-30 NOTE — Progress Notes (Addendum)
Follow up s/p GE junction  radiation  08/12/15-09/24/15,   Patient recently d/c hospital  Rapid A-Fib and CHF, new meds: pacerone, Eliquis,  Digoxin metoprolol, lasix, patient nauseated and dizzyness now, also looke starting of jaundice in his eyes and face,  Took ortho Vitals , difficulty swallowing solids and some liquids,   Placed moist cloth over neck, seemed to help nausea some stated   Wt Readings from Last 3 Encounters:  10/30/15 205 lb 1.6 oz (93.033 kg)  10/28/15 206 lb (93.441 kg)  10/23/15 208 lb 6.4 oz (94.53 kg)   Standing vitals=B/P=78/47,P=80,  BP 97/57 mmHg  Pulse 97  Temp(Src) 98.6 F (37 C) (Oral)  Resp 22  Ht 6' (1.829 m)  Wt 205 lb 1.6 oz (93.033 kg)  BMI 27.81 kg/m2  SpO2 97%  Wt Readings from Last 3 Encounters:  10/30/15 205 lb 1.6 oz (93.033 kg)  10/28/15 206 lb (93.441 kg)  10/23/15 208 lb 6.4 oz (94.53 kg)

## 2015-10-30 NOTE — Progress Notes (Signed)
Radiation Oncology         [phone] ________________________________  Name: Walter French MRN: [id number]  Date: 10/30/2015  DOB: 1946-11-17  Follow-Up Visit Note  CC: Karlene Einstein, MD  Ladell Pier, MD  Diagnosis: Cancer of the gastroesophageal junction.  Interval Since Last Radiation: 45 Gy in 10 fractions plus boost completed 09/24/2015.  Narrative:  The patient returns today for routine follow-up.  Patient was recently discharged form the hospital. When he takes medication he has to swallow one pill at a time. He mentions that his swallowing has improved a bit since treatment. He has not eaten today and had just one drink. He is nauseated today and got sick earlier. He mentions that his nausea is better. He also says that he feels dizzy.  ALLERGIES:  is allergic to levaquin; adhesive; and morphine and related.  Meds: Current Outpatient Prescriptions  Medication Sig Dispense Refill  . acetaminophen (TYLENOL) 500 MG tablet Take 1,000 mg by mouth every 6 (six) hours as needed for moderate pain or headache.    . allopurinol (ZYLOPRIM) 300 MG tablet Take 300 mg by mouth daily.      [name] amiodarone (PACERONE) 400 MG tablet Take 1 tablet (400 mg total) by mouth daily. 30 tablet 5  . atorvastatin (LIPITOR) 40 MG tablet Take 1 tablet (40 mg total) by mouth daily. 90 tablet 3  . digoxin (LANOXIN) 0.125 MG tablet Take 1 tablet (0.125 mg total) by mouth daily. 30 tablet 6  . furosemide (LASIX) 40 MG tablet Take 1 tablet (40 mg total) by mouth daily. 30 tablet 3  . glipiZIDE (GLUCOTROL) 10 MG tablet Take 10 mg by mouth 2 (two) times daily before a meal.     . levothyroxine (SYNTHROID, LEVOTHROID) 75 MCG tablet Take 75 mcg by mouth daily before breakfast.   0  . metoprolol succinate (TOPROL-XL) 50 MG 24 hr tablet Take 1 tablet (50 mg total) by mouth 2 (two) times daily. Take with or immediately following a meal. 60 tablet 3  . potassium chloride SA (K-DUR,KLOR-CON) 20 MEQ tablet Take  1 tablet (20 mEq total) by mouth daily. 30 tablet 11  . ranitidine (ZANTAC) 300 MG tablet Take 1 tablet (300 mg total) by mouth daily as needed for heartburn. 30 tablet 7  . saccharomyces boulardii (FLORASTOR) 250 MG capsule Take 1 capsule (250 mg total) by mouth 2 (two) times daily. 60 capsule 0  . valsartan (DIOVAN) 160 MG tablet Take 1 tablet (160 mg total) by mouth every evening.  1  . apixaban (ELIQUIS) 5 MG TABS tablet Take 1 tablet (5 mg total) by mouth 2 (two) times daily. 60 tablet 0  . guaiFENesin (MUCINEX) 600 MG 12 hr tablet Take 1 tablet (600 mg total) by mouth 2 (two) times daily. 40 tablet 0  . hyaluronate sodium (RADIAPLEXRX) GEL Apply 1 application topically as needed (treatment).     . nitroGLYCERIN (NITROSTAT) 0.4 MG SL tablet Place 1 tablet (0.4 mg total) under the tongue every 5 (five) minutes x 3 doses as needed for chest pain. (Patient not taking: Reported on 10/28/2015) 25 tablet 3  . ondansetron (ZOFRAN) 8 MG tablet Take 1 tablet (8 mg total) by mouth every 8 (eight) hours as needed for nausea or vomiting. (Patient not taking: Reported on 10/28/2015) 20 tablet 0  . prochlorperazine (COMPAZINE) 10 MG tablet Take 1 tablet (10 mg total) by mouth every 6 (six) hours as needed for nausea or vomiting. (Patient not taking: Reported on  10/28/2015) 30 tablet 1   No current facility-administered medications for this encounter.    Physical Findings: The patient is in no acute distress. Patient is alert and oriented.  height is 6' (1.829 m) and weight is 205 lb 1.6 oz (93.033 kg). His oral temperature is 98.6 F (37 C). His blood pressure is 78/47 and his pulse is 80. His respiration is 22 and oxygen saturation is 97%. .  Lab Findings: Lab Results  Component Value Date   WBC 6.4 10/23/2015   HGB 9.8* 10/23/2015   HCT 30.7* 10/23/2015   MCV 89.0 10/23/2015   PLT 157 10/23/2015     Radiographic Findings: Dg Chest 2 View  09/30/2015  CLINICAL DATA:  Shortness of breath and  cough for 3 days, generalized weakness EXAM: CHEST - 2 VIEW COMPARISON:  9/29/616 FINDINGS: Cardiac shadow is enlarged. A defibrillator is again seen. Postsurgical changes are noted. Lingular atelectatic changes are again seen. No sizable effusion is noted. No acute bony abnormality is seen. IMPRESSION: Stable changes in the lingula.  No acute abnormality noted. Electronically Signed   By: Inez Catalina M.D.   On: 09/30/2015 22:12   Dg Chest Portable 1 View  10/05/2015  CLINICAL DATA:  Atrial fibrillation. EXAM: PORTABLE CHEST 1 VIEW COMPARISON:  09/30/2015. FINDINGS: Stable enlarged cardiac silhouette and post CABG changes. The pulmonary vasculature and interstitial markings remain mildly prominent. Persistent ill-defined density at the left lung base. Stable left subclavian AICD lead. Unremarkable bones. IMPRESSION: 1. Stable mild left basilar atelectasis or scarring. 2. Stable mild cardiomegaly, mild pulmonary vascular congestion and mild chronic interstitial lung disease. Electronically Signed   By: Claudie Revering M.D.   On: 10/05/2015 14:19    Impression: Walter French is a 69 yo male with cancer of the esophogeal cancer recovering from treatment. The patient understands he needs to drink more fluids over the next couple of days. He does have effective nausea meds available.  Plan: He has a follow up appointment with Dr. Cloria Spring in three months. He will follow up with me in six months.   Jodelle Gross, M.D., Ph.D.     This document serves as a record of services personally performed by Kyung Rudd, MD. It was created on his behalf by  Lendon Collar, a trained medical scribe. The creation of this record is based on the scribe's personal observations and the provider's statements to them. This document has been checked and approved by the attending provider.

## 2015-11-04 ENCOUNTER — Ambulatory Visit (HOSPITAL_BASED_OUTPATIENT_CLINIC_OR_DEPARTMENT_OTHER)
Admission: RE | Admit: 2015-11-04 | Discharge: 2015-11-04 | Disposition: A | Discharge status: Home / Self Care | Payer: Medicare Other | Source: Ambulatory Visit | Attending: Internal Medicine | Admitting: Internal Medicine

## 2015-11-04 ENCOUNTER — Other Ambulatory Visit (HOSPITAL_COMMUNITY): Payer: Self-pay | Admitting: *Deleted

## 2015-11-04 ENCOUNTER — Telehealth: Payer: Self-pay | Admitting: Cardiology

## 2015-11-04 VITALS — BP 90/54 | HR 86

## 2015-11-04 DIAGNOSIS — I5042 Chronic combined systolic (congestive) and diastolic (congestive) heart failure: Secondary | ICD-10-CM

## 2015-11-04 DIAGNOSIS — E162 Hypoglycemia, unspecified: Secondary | ICD-10-CM | POA: Diagnosis not present

## 2015-11-04 DIAGNOSIS — Z7901 Long term (current) use of anticoagulants: Secondary | ICD-10-CM

## 2015-11-04 DIAGNOSIS — I130 Hypertensive heart and chronic kidney disease with heart failure and stage 1 through stage 4 chronic kidney disease, or unspecified chronic kidney disease: Secondary | ICD-10-CM | POA: Diagnosis not present

## 2015-11-04 LAB — BASIC METABOLIC PANEL
Anion gap: 10 (ref 5–15)
BUN: 13 mg/dL (ref 6–20)
CALCIUM: 9 mg/dL (ref 8.9–10.3)
CO2: 22 mmol/L (ref 22–32)
CREATININE: 1.53 mg/dL — AB (ref 0.61–1.24)
Chloride: 98 mmol/L — ABNORMAL LOW (ref 101–111)
GFR calc Af Amer: 53 mL/min — ABNORMAL LOW (ref >60)
GFR calc non Af Amer: 46 mL/min — ABNORMAL LOW (ref >60)
GLUCOSE: 208 mg/dL — AB (ref 65–99)
Potassium: 4.4 mmol/L (ref 3.5–5.1)
Sodium: 130 mmol/L — ABNORMAL LOW (ref 135–145)

## 2015-11-04 MED ORDER — GLIPIZIDE 10 MG PO TABS: 10.0000 mg | ORAL_TABLET | Freq: Two times a day (BID) | ORAL | Status: DC

## 2015-11-04 MED ORDER — APIXABAN 5 MG PO TABS: 5.0000 mg | ORAL_TABLET | Freq: Two times a day (BID) | ORAL | Status: DC

## 2015-11-04 NOTE — Addendum Note (Signed)
Encounter addended by: Harvie Junior, CMA on: 11/04/2015  2:33 PM<BR>     Documentation filed: Visit Diagnoses, Dx Association, Orders

## 2015-11-04 NOTE — Addendum Note (Signed)
Encounter addended by: Harvie Junior, CMA on: 11/04/2015  2:39 PM<BR>     Documentation filed: Vitals Section

## 2015-11-04 NOTE — Telephone Encounter (Signed)
Discussed w/Dr Aundra Dubin, he would like pt to stop Valsartan, hold Lasix for 2 days and then restart, if able pt needs bmet today.  Called and spoke w/pt, he is aware, agreeable and verbalizes understanding, he states his daughter is coming over later and he will see if she can bring him for labs

## 2015-11-04 NOTE — Telephone Encounter (Signed)
Spoke with pt, he reports every time he takes the furosemide he feels like he is going to pass out. He checked his bp while on the phone with me and reports his bp is 83/69. His heart rate is 104. His weight is stable at 190 lb. He c/o SOB that is normal for him. He has no edema. Pt advised not to take his furosemide today. Will forward to the advanced heart failure clinic for help with his furosemide dosing. He has an appt in heart failure clinic Monday.

## 2015-11-04 NOTE — Telephone Encounter (Signed)
Please call asap,Furosemide is making him sick. [place] like he is going to pass out.

## 2015-11-07 ENCOUNTER — Inpatient Hospital Stay (HOSPITAL_COMMUNITY)
Admission: EM | Admit: 2015-11-07 | Discharge: 2015-11-16 | DRG: 291 | Disposition: A | Discharge status: Home Health | Payer: Medicare Other | Attending: Internal Medicine | Admitting: Internal Medicine

## 2015-11-07 ENCOUNTER — Telehealth: Payer: Self-pay | Admitting: Nurse Practitioner

## 2015-11-07 ENCOUNTER — Encounter (HOSPITAL_COMMUNITY): Payer: Self-pay

## 2015-11-07 DIAGNOSIS — I9589 Other hypotension: Secondary | ICD-10-CM | POA: Diagnosis not present

## 2015-11-07 DIAGNOSIS — Z95810 Presence of automatic (implantable) cardiac defibrillator: Secondary | ICD-10-CM | POA: Diagnosis not present

## 2015-11-07 DIAGNOSIS — E871 Hypo-osmolality and hyponatremia: Secondary | ICD-10-CM | POA: Diagnosis present

## 2015-11-07 DIAGNOSIS — J45909 Unspecified asthma, uncomplicated: Secondary | ICD-10-CM | POA: Diagnosis present

## 2015-11-07 DIAGNOSIS — G4733 Obstructive sleep apnea (adult) (pediatric): Secondary | ICD-10-CM | POA: Diagnosis present

## 2015-11-07 DIAGNOSIS — E162 Hypoglycemia, unspecified: Secondary | ICD-10-CM | POA: Diagnosis present

## 2015-11-07 DIAGNOSIS — C160 Malignant neoplasm of cardia: Secondary | ICD-10-CM | POA: Diagnosis present

## 2015-11-07 DIAGNOSIS — I5030 Unspecified diastolic (congestive) heart failure: Secondary | ICD-10-CM

## 2015-11-07 DIAGNOSIS — Z9221 Personal history of antineoplastic chemotherapy: Secondary | ICD-10-CM | POA: Diagnosis not present

## 2015-11-07 DIAGNOSIS — I70201 Unspecified atherosclerosis of native arteries of extremities, right leg: Secondary | ICD-10-CM | POA: Diagnosis present

## 2015-11-07 DIAGNOSIS — I481 Persistent atrial fibrillation: Secondary | ICD-10-CM | POA: Diagnosis present

## 2015-11-07 DIAGNOSIS — E785 Hyperlipidemia, unspecified: Secondary | ICD-10-CM | POA: Diagnosis present

## 2015-11-07 DIAGNOSIS — Z951 Presence of aortocoronary bypass graft: Secondary | ICD-10-CM

## 2015-11-07 DIAGNOSIS — K5900 Constipation, unspecified: Secondary | ICD-10-CM | POA: Diagnosis present

## 2015-11-07 DIAGNOSIS — D638 Anemia in other chronic diseases classified elsewhere: Secondary | ICD-10-CM | POA: Diagnosis present

## 2015-11-07 DIAGNOSIS — I255 Ischemic cardiomyopathy: Secondary | ICD-10-CM | POA: Diagnosis present

## 2015-11-07 DIAGNOSIS — E440 Moderate protein-calorie malnutrition: Secondary | ICD-10-CM | POA: Diagnosis present

## 2015-11-07 DIAGNOSIS — Z923 Personal history of irradiation: Secondary | ICD-10-CM

## 2015-11-07 DIAGNOSIS — E1122 Type 2 diabetes mellitus with diabetic chronic kidney disease: Secondary | ICD-10-CM | POA: Diagnosis present

## 2015-11-07 DIAGNOSIS — E11649 Type 2 diabetes mellitus with hypoglycemia without coma: Secondary | ICD-10-CM | POA: Diagnosis present

## 2015-11-07 DIAGNOSIS — T501X5A Adverse effect of loop [high-ceiling] diuretics, initial encounter: Secondary | ICD-10-CM | POA: Diagnosis present

## 2015-11-07 DIAGNOSIS — M109 Gout, unspecified: Secondary | ICD-10-CM | POA: Diagnosis present

## 2015-11-07 DIAGNOSIS — K219 Gastro-esophageal reflux disease without esophagitis: Secondary | ICD-10-CM | POA: Diagnosis present

## 2015-11-07 DIAGNOSIS — E039 Hypothyroidism, unspecified: Secondary | ICD-10-CM | POA: Diagnosis present

## 2015-11-07 DIAGNOSIS — Z7901 Long term (current) use of anticoagulants: Secondary | ICD-10-CM

## 2015-11-07 DIAGNOSIS — R0600 Dyspnea, unspecified: Secondary | ICD-10-CM

## 2015-11-07 DIAGNOSIS — R0602 Shortness of breath: Secondary | ICD-10-CM

## 2015-11-07 DIAGNOSIS — C169 Malignant neoplasm of stomach, unspecified: Secondary | ICD-10-CM | POA: Diagnosis not present

## 2015-11-07 DIAGNOSIS — J189 Pneumonia, unspecified organism: Secondary | ICD-10-CM | POA: Diagnosis not present

## 2015-11-07 DIAGNOSIS — N183 Chronic kidney disease, stage 3 (moderate): Secondary | ICD-10-CM | POA: Diagnosis present

## 2015-11-07 DIAGNOSIS — I25810 Atherosclerosis of coronary artery bypass graft(s) without angina pectoris: Secondary | ICD-10-CM | POA: Diagnosis not present

## 2015-11-07 DIAGNOSIS — I2510 Atherosclerotic heart disease of native coronary artery without angina pectoris: Secondary | ICD-10-CM | POA: Diagnosis present

## 2015-11-07 DIAGNOSIS — Z6825 Body mass index (BMI) 25.0-25.9, adult: Secondary | ICD-10-CM

## 2015-11-07 DIAGNOSIS — E876 Hypokalemia: Secondary | ICD-10-CM | POA: Diagnosis not present

## 2015-11-07 DIAGNOSIS — Z87891 Personal history of nicotine dependence: Secondary | ICD-10-CM

## 2015-11-07 DIAGNOSIS — F419 Anxiety disorder, unspecified: Secondary | ICD-10-CM | POA: Diagnosis present

## 2015-11-07 DIAGNOSIS — I959 Hypotension, unspecified: Secondary | ICD-10-CM | POA: Diagnosis present

## 2015-11-07 DIAGNOSIS — E669 Obesity, unspecified: Secondary | ICD-10-CM | POA: Diagnosis present

## 2015-11-07 DIAGNOSIS — C159 Malignant neoplasm of esophagus, unspecified: Secondary | ICD-10-CM | POA: Diagnosis not present

## 2015-11-07 DIAGNOSIS — I5042 Chronic combined systolic (congestive) and diastolic (congestive) heart failure: Secondary | ICD-10-CM | POA: Diagnosis present

## 2015-11-07 DIAGNOSIS — J9601 Acute respiratory failure with hypoxia: Secondary | ICD-10-CM | POA: Diagnosis not present

## 2015-11-07 DIAGNOSIS — T462X5A Adverse effect of other antidysrhythmic drugs, initial encounter: Secondary | ICD-10-CM | POA: Diagnosis present

## 2015-11-07 DIAGNOSIS — I480 Paroxysmal atrial fibrillation: Secondary | ICD-10-CM | POA: Diagnosis present

## 2015-11-07 DIAGNOSIS — I5043 Acute on chronic combined systolic (congestive) and diastolic (congestive) heart failure: Secondary | ICD-10-CM | POA: Diagnosis present

## 2015-11-07 DIAGNOSIS — J984 Other disorders of lung: Secondary | ICD-10-CM

## 2015-11-07 DIAGNOSIS — I509 Heart failure, unspecified: Secondary | ICD-10-CM

## 2015-11-07 DIAGNOSIS — I130 Hypertensive heart and chronic kidney disease with heart failure and stage 1 through stage 4 chronic kidney disease, or unspecified chronic kidney disease: Secondary | ICD-10-CM | POA: Diagnosis present

## 2015-11-07 LAB — CBC WITH DIFFERENTIAL/PLATELET
BASOS ABS: 0 10*3/uL (ref 0.0–0.1)
BASOS PCT: 0 %
EOS ABS: 0.1 10*3/uL (ref 0.0–0.7)
EOS PCT: 1 %
HEMATOCRIT: 29.9 % — AB (ref 39.0–52.0)
HEMOGLOBIN: 9.7 g/dL — AB (ref 13.0–17.0)
Lymphocytes Relative: 6 %
Lymphs Abs: 0.5 10*3/uL — ABNORMAL LOW (ref 0.7–4.0)
MCH: 28.2 pg (ref 26.0–34.0)
MCHC: 32.4 g/dL (ref 30.0–36.0)
MCV: 86.9 fL (ref 78.0–100.0)
MONO ABS: 1 10*3/uL (ref 0.1–1.0)
Monocytes Relative: 12 %
Neutro Abs: 6.4 10*3/uL (ref 1.7–7.7)
Neutrophils Relative %: 81 %
Platelets: 171 10*3/uL (ref 150–400)
RBC: 3.44 MIL/uL — ABNORMAL LOW (ref 4.22–5.81)
RDW: 17.2 % — ABNORMAL HIGH (ref 11.5–15.5)
WBC: 7.9 10*3/uL (ref 4.0–10.5)

## 2015-11-07 LAB — COMPREHENSIVE METABOLIC PANEL
ALBUMIN: 2.1 g/dL — AB (ref 3.5–5.0)
ALK PHOS: 365 U/L — AB (ref 38–126)
ALT: 20 U/L (ref 17–63)
ANION GAP: 8 (ref 5–15)
AST: 54 U/L — ABNORMAL HIGH (ref 15–41)
BILIRUBIN TOTAL: 0.9 mg/dL (ref 0.3–1.2)
BUN: 11 mg/dL (ref 6–20)
CALCIUM: 8.8 mg/dL — AB (ref 8.9–10.3)
CO2: 25 mmol/L (ref 22–32)
CREATININE: 1.21 mg/dL (ref 0.61–1.24)
Chloride: 99 mmol/L — ABNORMAL LOW (ref 101–111)
GFR calc non Af Amer: >60 mL/min (ref >60)
GLUCOSE: 50 mg/dL — AB (ref 65–99)
Potassium: 4.6 mmol/L (ref 3.5–5.1)
Sodium: 132 mmol/L — ABNORMAL LOW (ref 135–145)
TOTAL PROTEIN: 6.6 g/dL (ref 6.5–8.1)

## 2015-11-07 LAB — URINE MICROSCOPIC-ADD ON: RBC / HPF: NONE SEEN RBC/hpf (ref 0–5)

## 2015-11-07 LAB — URINALYSIS, ROUTINE W REFLEX MICROSCOPIC
GLUCOSE, UA: NEGATIVE mg/dL
Hgb urine dipstick: NEGATIVE
KETONES UR: 15 mg/dL — AB
LEUKOCYTES UA: NEGATIVE
Nitrite: NEGATIVE
PROTEIN: 30 mg/dL — AB
Specific Gravity, Urine: 1.025 (ref 1.005–1.030)
pH: 5.5 (ref 5.0–8.0)

## 2015-11-07 LAB — CBG MONITORING, ED
GLUCOSE-CAPILLARY: 142 mg/dL — AB (ref 65–99)
GLUCOSE-CAPILLARY: 38 mg/dL — AB (ref 65–99)
GLUCOSE-CAPILLARY: 92 mg/dL (ref 65–99)
Glucose-Capillary: 124 mg/dL — ABNORMAL HIGH (ref 65–99)
Glucose-Capillary: 32 mg/dL — CL (ref 65–99)

## 2015-11-07 LAB — DIGOXIN LEVEL: Digoxin Level: 0.5 ng/mL — ABNORMAL LOW (ref 0.8–2.0)

## 2015-11-07 MED ORDER — INSULIN ASPART 100 UNIT/ML ~~LOC~~ SOLN: 0.0000 [IU] | SUBCUTANEOUS | Status: DC

## 2015-11-07 MED ORDER — DEXTROSE 50 % IV SOLN
INTRAVENOUS | Status: AC
  Administered 2015-11-07: 50 mL via INTRAVENOUS
  Filled 2015-11-07: qty 50

## 2015-11-07 MED ORDER — DEXTROSE 50 % IV SOLN
25.0000 g | Freq: Once | INTRAVENOUS | Status: DC
  Administered 2015-11-07: 50 mL via INTRAVENOUS

## 2015-11-07 MED ORDER — DEXTROSE 10 % IV SOLN
INTRAVENOUS | Status: DC
  Administered 2015-11-07: 23:00:00 via INTRAVENOUS
  Administered 2015-11-08: 75 mL via INTRAVENOUS

## 2015-11-07 MED ORDER — DEXTROSE 50 % IV SOLN
25.0000 g | Freq: Once | INTRAVENOUS | Status: AC
  Administered 2015-11-07: 25 g via INTRAVENOUS
  Filled 2015-11-07: qty 50

## 2015-11-07 NOTE — ED Notes (Signed)
CBG- 92 

## 2015-11-07 NOTE — ED Provider Notes (Addendum)
CSN: [id number]     Arrival date & time 11/07/15  2128 History   First MD Initiated Contact with Patient 11/07/15 2137     Chief Complaint  Patient presents with  . Hypoglycemia     (Consider location/radiation/quality/duration/timing/severity/associated sxs/prior Treatment) HPI  69 year old male who presents with hypoglycemia. He has a history of CAD status post CABG 3, ischemic cardiomyopathy with history of VT status post AICD, chronic combined systolic and diastolic heart failure, diabetes, hypertension, hyperlipidemia, and gastroesophageal cancer status post recent chemotherapy radiation therapy one month ago. States that his cancer is currently in remission after chemotherapy radiation therapy, but since treatment, he has been having a significantly decreased by mouth intake and decreased appetite. He has had a 70 pound weight loss in the course of the past 4 months. States that he has been taken off of his insulin for recurrent hypoglycemia. Continues to take glipizide. States that he continues to have frequent hypoglycemic episodes at home. Over the course of the past several weeks, he has had multiple episodes of hypoglycemia with a glucose in the 40s to 50s. States that he has been trying to take care of this at home and has not told his physicians. This evening, states that he felt jittery, anxious, and had tremors. I checked his glucose to be around 29 at home. He was brought to the emergency department by a neighbor for further evaluation. Denies any recent illnesses including nausea, vomiting, diarrhea, abdominal pain, chest pain, difficulty breathing, fevers, chills, cough, congestion, sore throat, runny nose, or any urinary complaints.   Past Medical History  Diagnosis Date  . Hyperlipidemia   . Hypertension   . Coronary heart disease     a. s/p CABG x 3 (VG->PDA, VG->PL, LIMA->LAD);  b. 2013 Abnl Myoview;  c. 2013 Cath: 3/3 patent grafts, occluded LCX which correlated w/  ischemia on MV->not amenable to PCI->Med Rx.  . Obesity   . CHF (congestive heart failure) ([place])   . VT (ventricular tachycardia) ([place])   . SVT (supraventricular tachycardia) ([place])   . PAD (peripheral artery disease) ([place])     Angiography in February of 2014: Moderate left iliac artery stenosis, mild to moderate diffuse right SFA disease. Severe proximal right popliteal artery stenosis with three-vessel runoff below the knee. Status post self-expanding stent placement to the proximal popliteal artery  . Implantable cardioverter-defibrillator Mdt   . Asthma     "small touch" (07/29/2013)  . History of pneumonia     "3-4 times; last time ~ 2003" (07/29/2013)  . OSA on CPAP   . GERD (gastroesophageal reflux disease)   . H/O hiatal hernia   . Gout   . Anxiety   . Allergy   . Stomach cancer ([place]) 07/23/2015    invasive adenocarcinoma ,esophagus ,distal   . Type II diabetes mellitus ([place])     on insulin  . Sleep apnea   . Hyperlipidemia   . PAD (peripheral artery disease) ([place])   . CHF (congestive heart failure) ([place])   . A-fib ([place])   . CKD (chronic kidney disease), stage III   . S/P radiation therapy 08/12/15-09/24/15    Ge  junction 54 Gy   Past Surgical History  Procedure Laterality Date  . Tonsillectomy    . Cardiac catheterization    . Wrist fracture surgery Right   . Cataract extraction w/ intraocular lens implant Left   . Cardiac defibrillator placement  1997; ~ 2003  . Coronary artery bypass graft  1997; 2007    "  X3" (07/29/2013)  . Nerve, tendon and artery repair Left 09/20/2013    Procedure: IRRIGATION AND DEBRIDEMENT,Exploration and Repair of Ulnar/Digital  Artery Nerve of Left Index finger;  Surgeon: Roseanne Kaufman, MD;  Location: [place];  Service: Orthopedics;  Laterality: Left;  . Abdominal aortagram N/A 01/30/2013    Procedure: ABDOMINAL Maxcine Ham;  Surgeon: Wellington Hampshire, MD;  Location: [facility] CATH LAB;  Service: Cardiovascular;  Laterality: N/A;  . Cardiac  catheterization N/A 07/22/2015    Procedure: Left Heart Cath and Coronary Angiography;  Surgeon: Peter M Martinique, MD;  Location: [place] CV LAB;  Service: Cardiovascular;  Laterality: N/A;  . Esophagogastroduodenoscopy N/A 07/23/2015    Procedure: ESOPHAGOGASTRODUODENOSCOPY (EGD);  Surgeon: Wonda Horner, MD;  Location: [facility] ENDOSCOPY;  Service: Endoscopy;  Laterality: N/A;  . Tee without cardioversion N/A 10/06/2015    Procedure: TRANSESOPHAGEAL ECHOCARDIOGRAM (TEE);  Surgeon: Pixie Casino, MD;  Location: [facility] ENDOSCOPY;  Service: Cardiovascular;  Laterality: N/A;   Family History  Problem Relation Age of Onset  . Heart disease Brother   . Cancer Brother   . Breast cancer Mother    Social History  Substance Use Topics  . Smoking status: Former Smoker -- 2.00 packs/day for 30 years    Types: Cigarettes    Quit date: 12/13/1995  . Smokeless tobacco: Never Used  . Alcohol Use: No     Comment: 07/29/2013 "not touched any alcohol since 1997"    Review of Systems  10/14 systems reviewed and are negative other than those stated in the HPI   Allergies  Levaquin; Adhesive; and Morphine and related  Home Medications   Prior to Admission medications   Medication Sig Start Date End Date Taking? Authorizing Provider  glipiZIDE (GLUCOTROL) 10 MG tablet Take 1 tablet (10 mg total) by mouth 2 (two) times daily before a meal. 11/04/15  Yes Larey Dresser, MD  acetaminophen (TYLENOL) 500 MG tablet Take 1,000 mg by mouth every 6 (six) hours as needed for moderate pain or headache.    Historical Provider, MD  allopurinol (ZYLOPRIM) 300 MG tablet Take 300 mg by mouth daily.      Historical Provider, MD  amiodarone (PACERONE) 400 MG tablet Take 1 tablet (400 mg total) by mouth daily. 10/22/15   Evelene Croon Barrett, PA-C  apixaban (ELIQUIS) 5 MG TABS tablet Take 1 tablet (5 mg total) by mouth 2 (two) times daily. 11/04/15   Larey Dresser, MD  atorvastatin (LIPITOR) 40 MG tablet Take 1 tablet (40 mg  total) by mouth daily. 03/11/15   Deboraha Sprang, MD  digoxin (LANOXIN) 0.125 MG tablet Take 1 tablet (0.125 mg total) by mouth daily. 10/20/15   Peter M Martinique, MD  furosemide (LASIX) 40 MG tablet Take 1 tablet (40 mg total) by mouth daily. 10/28/15   Larey Dresser, MD  guaiFENesin (MUCINEX) 600 MG 12 hr tablet Take 1 tablet (600 mg total) by mouth 2 (two) times daily. 10/02/15   Barton Dubois, MD  hyaluronate sodium (RADIAPLEXRX) GEL Apply 1 application topically as needed (treatment).     Historical Provider, MD  levothyroxine (SYNTHROID, LEVOTHROID) 75 MCG tablet Take 75 mcg by mouth daily before breakfast.  12/06/14   Historical Provider, MD  metoprolol succinate (TOPROL-XL) 50 MG 24 hr tablet Take 1 tablet (50 mg total) by mouth 2 (two) times daily. Take with or immediately following a meal. 10/28/15   Larey Dresser, MD  nitroGLYCERIN (NITROSTAT) 0.4 MG SL tablet Place 1 tablet (0.4  mg total) under the tongue every 5 (five) minutes x 3 doses as needed for chest pain. Patient not taking: Reported on 10/28/2015 10/03/14   Peter M Martinique, MD  ondansetron (ZOFRAN) 8 MG tablet Take 1 tablet (8 mg total) by mouth every 8 (eight) hours as needed for nausea or vomiting. Patient not taking: Reported on 10/28/2015 09/21/15   Rondel Jumbo, PA-C  potassium chloride SA (K-DUR,KLOR-CON) 20 MEQ tablet Take 1 tablet (20 mEq total) by mouth daily. 10/10/15   Brett Canales, PA-C  prochlorperazine (COMPAZINE) 10 MG tablet Take 1 tablet (10 mg total) by mouth every 6 (six) hours as needed for nausea or vomiting. Patient not taking: Reported on 10/28/2015 09/03/15   Owens Shark, NP  ranitidine (ZANTAC) 300 MG tablet Take 1 tablet (300 mg total) by mouth daily as needed for heartburn. 10/15/15   Peter M Martinique, MD  saccharomyces boulardii (FLORASTOR) 250 MG capsule Take 1 capsule (250 mg total) by mouth 2 (two) times daily. 10/02/15   Barton Dubois, MD   BP 106/65 mmHg  Pulse 79  Temp(Src) 98.3 F (36.8 C)  (Oral)  Resp 37  Ht 6' (1.829 m)  Wt 191 lb (86.637 kg)  BMI 25.90 kg/m2  SpO2 94% Physical Exam Physical Exam  Nursing note and vitals reviewed. Constitutional: Appears older than stated age, chronically ill-appearing, appears mildly anxious and agitated Head: Normocephalic and atraumatic.  Mouth/Throat: Oropharynx is clear and dry.  Neck: Normal range of motion. Neck supple.  Cardiovascular: Normal rate and regular rhythm.   Pulmonary/Chest: Effort normal and breath sounds normal.  Abdominal: Soft. There is no tenderness. There is no rebound and no guarding.  Musculoskeletal: Normal range of motion.  Neurological: Alert, no facial droop, fluent speech, moves all extremities symmetrically Skin: Skin is warm and dry.  Psychiatric: Cooperative  ED Course  Procedures (including critical care time) Labs Review Labs Reviewed  CBC WITH DIFFERENTIAL/PLATELET - Abnormal; Notable for the following:    RBC 3.44 (*)    Hemoglobin 9.7 (*)    HCT 29.9 (*)    RDW 17.2 (*)    Lymphs Abs 0.5 (*)    All other components within normal limits  COMPREHENSIVE METABOLIC PANEL - Abnormal; Notable for the following:    Sodium 132 (*)    Chloride 99 (*)    Glucose, Bld 50 (*)    Calcium 8.8 (*)    Albumin 2.1 (*)    AST 54 (*)    Alkaline Phosphatase 365 (*)    All other components within normal limits  URINALYSIS, ROUTINE W REFLEX MICROSCOPIC (NOT AT [facility]) - Abnormal; Notable for the following:    Color, Urine AMBER (*)    APPearance CLOUDY (*)    Bilirubin Urine SMALL (*)    Ketones, ur 15 (*)    Protein, ur 30 (*)    All other components within normal limits  DIGOXIN LEVEL - Abnormal; Notable for the following:    Digoxin Level 0.5 (*)    All other components within normal limits  URINE MICROSCOPIC-ADD ON - Abnormal; Notable for the following:    Squamous Epithelial / LPF 0-5 (*)    Bacteria, UA FEW (*)    Casts HYALINE CASTS (*)    All other components within normal limits  CBG  MONITORING, ED - Abnormal; Notable for the following:    Glucose-Capillary 32 (*)    All other components within normal limits  CBG MONITORING, ED - Abnormal; Notable for  the following:    Glucose-Capillary 124 (*)    All other components within normal limits  CBG MONITORING, ED - Abnormal; Notable for the following:    Glucose-Capillary 38 (*)    All other components within normal limits  CBG MONITORING, ED - Abnormal; Notable for the following:    Glucose-Capillary 142 (*)    All other components within normal limits  HEMOGLOBIN A1C  CBG MONITORING, ED    II have personally reviewed and evaluated these images and lab results as part of my medical decision-making.   EKG Interpretation   Date/Time:  Saturday November 07 2015 21:58:04 EST Ventricular Rate:  86 PR Interval:    QRS Duration: 120 QT Interval:  487 QTC Calculation: 583 R Axis:   88 Text Interpretation:  Atrial fibrillation Nonspecific intraventricular  conduction delay Q-waves in the inferior leads No significant change since  last tracing Reconfirmed by Gwenivere Hiraldo MD, Marelin Tat [phone]) on 11/08/2015 12:20:04 AM      CRITICAL CARE Performed by: Forde Dandy   Total critical care time: 30 minutes  Critical care time was exclusive of separately billable procedures and treating other patients.  Critical care was necessary to treat or prevent imminent or life-threatening deterioration.  Critical care was time spent personally by me on the following activities: development of treatment plan with patient and/or surrogate as well as nursing, discussions with consultants, evaluation of patient's response to treatment, examination of patient, obtaining history from patient or surrogate, ordering and performing treatments and interventions, ordering and review of laboratory studies, ordering and review of radiographic studies, pulse oximetry and re-evaluation of patient's condition.   MDM   Final diagnoses:  Hypoglycemia    69 year old male who presents with recurrent hypoglycemia in the setting of taking sulfonylurea for diabetes. His hypoglycemic in triage with a point-of-care glucose of 38. Did receive an amp of D50, with improved symptoms. Recurrent hypoglycemia likely in the setting of significant weight loss and decreased appetite/intake over the course of the past several weeks. Has recurrent hypoglycemia, requiring a second amp of D50 and placement on a D10 drip. Is also given by mouth intake. No significant renal dysfunction, no other major metabolic or electrolyte derangements. He has stable normocytic anemia. Given recurrence of hypoglycemia and given that he is on a sulfonylurea, I discussed this patient with Dr. Arnoldo Morale and admitted him to stepdown for ongoing management.    Forde Dandy, MD 11/08/15 [id number]  Forde Dandy, MD 11/08/15 [phone]

## 2015-11-07 NOTE — ED Notes (Signed)
CBG 32 in triage

## 2015-11-07 NOTE — H&P (Signed)
[facility] Admission History and Physical       Walter French [id number] DOB: 04/16/1946 DOA: 11/07/2015  Referring physician: EDP PCP: Karlene Einstein, MD  Specialists:   Chief Complaint: Low Blood Sugar  HPI: Walter French is a 69 y.o. male with a history of Gastroesophageal Cancer , CAD, CHF with EF= 30-35%. Persistent Atrial Fibrillation on Eliquis Rx, DM2, who presents to the ED with complaints of low blood sugars off and on for the past 2 weeks.  He reports a poor appetite for the past 6 weeks following his last chemotherapy.   He reprots tht he was taken off his Insulin therapy by his PCP, and he remained on his oral Diabetic Rx but he has still had low blood sugars.    Tonight his glucose level of 38, and he was given IV Dextrose several times in the ED and his glucose continued to drop back down so he was placed on IV D10 drip and referred for admission.     Review of Systems:  Constitutional: No Weight Loss, No Weight Gain, Night Sweats, Fevers, Chills, Dizziness, Light Headedness, Fatigue, or Generalized Weakness HEENT: No Headaches, Difficulty Swallowing,Tooth/Dental Problems,Sore Throat,  No Sneezing, Rhinitis, Ear Ache, Nasal Congestion, or Post Nasal Drip,  Cardio-vascular:  No Chest pain, Orthopnea, PND, Edema in Lower Extremities, Anasarca, Dizziness, Palpitations  Resp: No Dyspnea, No DOE, No Productive Cough, No Non-Productive Cough, No Hemoptysis, No Wheezing.    GI: No Heartburn, Indigestion, Abdominal Pain, Nausea, Vomiting, Diarrhea, Constipation, Hematemesis, Hematochezia, Melena, Change in Bowel Habits,  Loss of Appetite  GU: No Dysuria, No Change in Color of Urine, No Urgency or Urinary Frequency, No Flank pain.  Musculoskeletal: No Joint Pain or Swelling, No Decreased Range of Motion, No Back Pain.  Neurologic: No Syncope, No Seizures, Muscle Weakness, Paresthesia, Vision Disturbance or Loss, No Diplopia, No Vertigo, No Difficulty Walking,  Skin:  No Rash or Lesions. Psych: No Change in Mood or Affect, No Depression or Anxiety, No Memory loss, No Confusion, or Hallucinations   Past Medical History  Diagnosis Date  . Hyperlipidemia   . Hypertension   . Coronary heart disease     a. s/p CABG x 3 (VG->PDA, VG->PL, LIMA->LAD);  b. 2013 Abnl Myoview;  c. 2013 Cath: 3/3 patent grafts, occluded LCX which correlated w/ ischemia on MV->not amenable to PCI->Med Rx.  . Obesity   . CHF (congestive heart failure) ([place])   . VT (ventricular tachycardia) ([place])   . SVT (supraventricular tachycardia) ([place])   . PAD (peripheral artery disease) ([place])     Angiography in February of 2014: Moderate left iliac artery stenosis, mild to moderate diffuse right SFA disease. Severe proximal right popliteal artery stenosis with three-vessel runoff below the knee. Status post self-expanding stent placement to the proximal popliteal artery  . Implantable cardioverter-defibrillator Mdt   . Asthma     "small touch" (07/29/2013)  . History of pneumonia     "3-4 times; last time ~ 2003" (07/29/2013)  . OSA on CPAP   . GERD (gastroesophageal reflux disease)   . H/O hiatal hernia   . Gout   . Anxiety   . Allergy   . Stomach cancer ([place]) 07/23/2015    invasive adenocarcinoma ,esophagus ,distal   . Type II diabetes mellitus ([place])     on insulin  . Sleep apnea   . Hyperlipidemia   . PAD (peripheral artery disease) ([place])   . CHF (congestive heart failure) ([place])   . A-fib (  HCC)   . CKD (chronic kidney disease), stage III   . S/P radiation therapy 08/12/15-09/24/15    Ge  junction 54 Gy     Past Surgical History  Procedure Laterality Date  . Tonsillectomy    . Cardiac catheterization    . Wrist fracture surgery Right   . Cataract extraction w/ intraocular lens implant Left   . Cardiac defibrillator placement  1997; ~ 2003  . Coronary artery bypass graft  1997; 2007    "X3" (07/29/2013)  . Nerve, tendon and artery repair Left 09/20/2013    Procedure:  IRRIGATION AND DEBRIDEMENT,Exploration and Repair of Ulnar/Digital  Artery Nerve of Left Index finger;  Surgeon: Roseanne Kaufman, MD;  Location: [place];  Service: Orthopedics;  Laterality: Left;  . Abdominal aortagram N/A 01/30/2013    Procedure: ABDOMINAL Maxcine Ham;  Surgeon: Wellington Hampshire, MD;  Location: [facility] CATH LAB;  Service: Cardiovascular;  Laterality: N/A;  . Cardiac catheterization N/A 07/22/2015    Procedure: Left Heart Cath and Coronary Angiography;  Surgeon: Peter M Martinique, MD;  Location: [place] CV LAB;  Service: Cardiovascular;  Laterality: N/A;  . Esophagogastroduodenoscopy N/A 07/23/2015    Procedure: ESOPHAGOGASTRODUODENOSCOPY (EGD);  Surgeon: Wonda Horner, MD;  Location: [facility] ENDOSCOPY;  Service: Endoscopy;  Laterality: N/A;  . Tee without cardioversion N/A 10/06/2015    Procedure: TRANSESOPHAGEAL ECHOCARDIOGRAM (TEE);  Surgeon: Pixie Casino, MD;  Location: [facility] ENDOSCOPY;  Service: Cardiovascular;  Laterality: N/A;      Prior to Admission medications   Medication Sig Start Date End Date Taking? Authorizing Provider  glipiZIDE (GLUCOTROL) 10 MG tablet Take 1 tablet (10 mg total) by mouth 2 (two) times daily before a meal. 11/04/15  Yes Larey Dresser, MD  acetaminophen (TYLENOL) 500 MG tablet Take 1,000 mg by mouth every 6 (six) hours as needed for moderate pain or headache.    Historical Provider, MD  allopurinol (ZYLOPRIM) 300 MG tablet Take 300 mg by mouth daily.      Historical Provider, MD  amiodarone (PACERONE) 400 MG tablet Take 1 tablet (400 mg total) by mouth daily. 10/22/15   Evelene Croon Barrett, PA-C  apixaban (ELIQUIS) 5 MG TABS tablet Take 1 tablet (5 mg total) by mouth 2 (two) times daily. 11/04/15   Larey Dresser, MD  atorvastatin (LIPITOR) 40 MG tablet Take 1 tablet (40 mg total) by mouth daily. 03/11/15   Deboraha Sprang, MD  digoxin (LANOXIN) 0.125 MG tablet Take 1 tablet (0.125 mg total) by mouth daily. 10/20/15   Peter M Martinique, MD  furosemide (LASIX) 40 MG  tablet Take 1 tablet (40 mg total) by mouth daily. 10/28/15   Larey Dresser, MD  guaiFENesin (MUCINEX) 600 MG 12 hr tablet Take 1 tablet (600 mg total) by mouth 2 (two) times daily. 10/02/15   Barton Dubois, MD  hyaluronate sodium (RADIAPLEXRX) GEL Apply 1 application topically as needed (treatment).     Historical Provider, MD  levothyroxine (SYNTHROID, LEVOTHROID) 75 MCG tablet Take 75 mcg by mouth daily before breakfast.  12/06/14   Historical Provider, MD  metoprolol succinate (TOPROL-XL) 50 MG 24 hr tablet Take 1 tablet (50 mg total) by mouth 2 (two) times daily. Take with or immediately following a meal. 10/28/15   Larey Dresser, MD  nitroGLYCERIN (NITROSTAT) 0.4 MG SL tablet Place 1 tablet (0.4 mg total) under the tongue every 5 (five) minutes x 3 doses as needed for chest pain. Patient not taking: Reported on 10/28/2015 10/03/14  Peter M Martinique, MD  ondansetron (ZOFRAN) 8 MG tablet Take 1 tablet (8 mg total) by mouth every 8 (eight) hours as needed for nausea or vomiting. Patient not taking: Reported on 10/28/2015 09/21/15   Rondel Jumbo, PA-C  potassium chloride SA (K-DUR,KLOR-CON) 20 MEQ tablet Take 1 tablet (20 mEq total) by mouth daily. 10/10/15   Brett Canales, PA-C  prochlorperazine (COMPAZINE) 10 MG tablet Take 1 tablet (10 mg total) by mouth every 6 (six) hours as needed for nausea or vomiting. Patient not taking: Reported on 10/28/2015 09/03/15   Owens Shark, NP  ranitidine (ZANTAC) 300 MG tablet Take 1 tablet (300 mg total) by mouth daily as needed for heartburn. 10/15/15   Peter M Martinique, MD  saccharomyces boulardii (FLORASTOR) 250 MG capsule Take 1 capsule (250 mg total) by mouth 2 (two) times daily. 10/02/15   Barton Dubois, MD     Allergies  Allergen Reactions  . Levaquin [Levofloxacin In D5w] Nausea And Vomiting  . Adhesive [Tape] Other (See Comments)    Pulls skin off / please use paper tape  . Morphine And Related Nausea And Vomiting    Social History:   reports that he quit smoking about 19 years ago. His smoking use included Cigarettes. He has a 60 pack-year smoking history. He has never used smokeless tobacco. He reports that he does not drink alcohol or use illicit drugs.    Family History  Problem Relation Age of Onset  . Heart disease Brother   . Cancer Brother   . Breast cancer Mother        Physical Exam:  GEN:  Pleasant Elderly Well Nourished and Well Developed 69 y.o. Caucasian male examined and in no acute distress; cooperative with exam Filed Vitals:   11/07/15 2145 11/07/15 2200 11/07/15 2215  BP: 106/52 101/54 130/63  Pulse: 89  77  Temp: 98.3 F (36.8 C)    TempSrc: Oral    Resp: 36 31 25  Height: 6' (1.829 m)    Weight: 86.637 kg (191 lb)    SpO2: 98%  93%   Blood pressure 130/63, pulse 77, temperature 98.3 F (36.8 C), temperature source Oral, resp. rate 25, height 6' (1.829 m), weight 86.637 kg (191 lb), SpO2 93 %. PSYCH: He is alert and oriented x4; does not appear anxious does not appear depressed; affect is normal HEENT: Normocephalic and Atraumatic, Mucous membranes pink; PERRLA; EOM intact; Fundi:  Benign;  No scleral icterus, Nares: Patent, Oropharynx: Clear, Fair Dentition,    Neck:  FROM, No Cervical Lymphadenopathy nor Thyromegaly or Carotid Bruit; No JVD; Breasts:: Not examined CHEST WALL: No tenderness CHEST: Normal respiration, clear to auscultation bilaterally HEART: Irregular rate and rhythm; no murmurs rubs or gallops BACK: No kyphosis or scoliosis; No CVA tenderness ABDOMEN: Positive Bowel Sounds, Soft Non-Tender, No Rebound or Guarding; No Masses, No Organomegaly. Rectal Exam: Not done EXTREMITIES: No  Cyanosis, Clubbing, or Edema; No Ulcerations. Genitalia: not examined PULSES: 2+ and symmetric SKIN: Normal hydration no rash or ulceration CNS:  Alert and Oriented x 4, No Focal Deficits Vascular: pulses palpable throughout    Labs on Admission:  Basic Metabolic Panel:  Recent  Labs Lab 11/04/15 1415  NA 130*  K 4.4  CL 98*  CO2 22  GLUCOSE 208*  BUN 13  CREATININE 1.53*  CALCIUM 9.0   Liver Function Tests: No results for input(s): AST, ALT, ALKPHOS, BILITOT, PROT, ALBUMIN in the last 168 hours. No results for input(s): LIPASE, AMYLASE  in the last 168 hours. No results for input(s): AMMONIA in the last 168 hours. CBC:  Recent Labs Lab 11/07/15 2235  WBC 7.9  NEUTROABS 6.4  HGB 9.7*  HCT 29.9*  MCV 86.9  PLT 171   Cardiac Enzymes: No results for input(s): CKTOTAL, CKMB, CKMBINDEX, TROPONINI in the last 168 hours.  BNP (last 3 results)  Recent Labs  09/10/15 1844 09/30/15 2156 10/07/15 0258  BNP 254.6* 704.3* 999.3*    ProBNP (last 3 results) No results for input(s): PROBNP in the last 8760 hours.  CBG:  Recent Labs Lab 11/07/15 2133 11/07/15 2155 11/07/15 2252 11/07/15 2308  GLUCAP 32* 124* 38* 142*    Radiological Exams on Admission: No results found.   EKG: Independently reviewed. Atrial fibrillation rate =86   Assessment/Plan:      69 y.o. male with  Principal Problem:  1.      Hypoglycemia   IV D10 drip   Monitor Glucose every hour x 12 hours   Hold Glipizide Rx   Follow Hypoglycemic Protocol   Active Problems:   2.    Hypotension   IVFs   Hold Lasix Rx and BP Meds   Monitor BPs     3.    Coronary artery disease-status post CABG   Cardiac monitoring     4.    Systolic and diastolic CHF, chronic (HCC)   Monitor I/Os     5.    Atrial fibrillation (HCC)   On Amiodarone, Digoxin and Eliquis Rx     6.    Chronic anticoagulation - Eliquis, CHADS2VASC=5   Due to Persisitent Atrial Fibrillation     7.    Anemia of chronic disease   Monitor Trend     8.    Hypothyroidism   Continue Levothyroxine Rx    9.    Hyperlipidemia   Continue Atorvastatin Rx    10.   Stom[facility])   Oncologist is Dr Benay Spice    11.   DVT Prophylaxis   On Eliquis Rx    Code Status:     FULL CODE    Family Communication:   Family at Bedside       Disposition Plan:    Inpatient   Status        Time spent:  [facility] Pager [phone]   If [place] Please Contact the Day Rounding Team MD for [facility]  If 7PM-7AM, Please Contact Night-Floor Coverage  [email or web address] Password [facility] 11/07/2015, 11:26 PM     ADDENDUM:   Patient was seen and examined on 11/07/2015

## 2015-11-07 NOTE — ED Notes (Addendum)
CBG 38, Dr Oleta Mouse notified and gave verbal order to give pt amp of d50 and start d10 drip. Pt given orange juice to drink. Pt alert and oriented x 4.

## 2015-11-07 NOTE — Telephone Encounter (Signed)
   Pt called reporting that he's feeling dizzy and bp is in 60's and blood glucose was in 30's.  He took all his meds earlier and has been eating and drinking ok.  I rec that he call EMS for eval and probable tx to ED.  Caller verbalized understanding and was grateful for the call back.  Murray Hodgkins, NP 11/07/2015, 3:39 PM

## 2015-11-07 NOTE — ED Notes (Signed)
CBG 124 

## 2015-11-07 NOTE — ED Notes (Signed)
Pt took his blood sugar before bed and got a reading of 26. Pt states that he took his glipizide this evening and the pt states that he has been having blood sugar readings in the 40s and has not been eating or drinking well recently. Pt alert and oriented x 4. CBG here 36. Pt to room Trauma C, IV started and given d50.

## 2015-11-08 ENCOUNTER — Inpatient Hospital Stay (HOSPITAL_COMMUNITY): Payer: Medicare Other

## 2015-11-08 DIAGNOSIS — I481 Persistent atrial fibrillation: Secondary | ICD-10-CM

## 2015-11-08 DIAGNOSIS — I5043 Acute on chronic combined systolic (congestive) and diastolic (congestive) heart failure: Secondary | ICD-10-CM

## 2015-11-08 DIAGNOSIS — I255 Ischemic cardiomyopathy: Secondary | ICD-10-CM

## 2015-11-08 LAB — BASIC METABOLIC PANEL
ANION GAP: 6 (ref 5–15)
BUN: 10 mg/dL (ref 6–20)
CO2: 26 mmol/L (ref 22–32)
Calcium: 8.5 mg/dL — ABNORMAL LOW (ref 8.9–10.3)
Chloride: 98 mmol/L — ABNORMAL LOW (ref 101–111)
Creatinine, Ser: 1.13 mg/dL (ref 0.61–1.24)
Glucose, Bld: 71 mg/dL (ref 65–99)
POTASSIUM: 3.8 mmol/L (ref 3.5–5.1)
SODIUM: 130 mmol/L — AB (ref 135–145)

## 2015-11-08 LAB — GLUCOSE, CAPILLARY
GLUCOSE-CAPILLARY: 43 mg/dL — AB (ref 65–99)
GLUCOSE-CAPILLARY: 56 mg/dL — AB (ref 65–99)
GLUCOSE-CAPILLARY: 80 mg/dL (ref 65–99)
GLUCOSE-CAPILLARY: 133 mg/dL — AB (ref 65–99)
GLUCOSE-CAPILLARY: 67 mg/dL (ref 65–99)
GLUCOSE-CAPILLARY: 80 mg/dL (ref 65–99)
GLUCOSE-CAPILLARY: 63 mg/dL — AB (ref 65–99)
GLUCOSE-CAPILLARY: 114 mg/dL — AB (ref 65–99)
GLUCOSE-CAPILLARY: 179 mg/dL — AB (ref 65–99)
Glucose-Capillary: 110 mg/dL — ABNORMAL HIGH (ref 65–99)
Glucose-Capillary: 123 mg/dL — ABNORMAL HIGH (ref 65–99)
Glucose-Capillary: 50 mg/dL — ABNORMAL LOW (ref 65–99)

## 2015-11-08 LAB — CBC
HEMATOCRIT: 24.8 % — AB (ref 39.0–52.0)
HEMOGLOBIN: 8.1 g/dL — AB (ref 13.0–17.0)
MCH: 28.3 pg (ref 26.0–34.0)
MCHC: 32.7 g/dL (ref 30.0–36.0)
MCV: 86.7 fL (ref 78.0–100.0)
Platelets: 131 10*3/uL — ABNORMAL LOW (ref 150–400)
RBC: 2.86 MIL/uL — AB (ref 4.22–5.81)
RDW: 17.2 % — AB (ref 11.5–15.5)
WBC: 6.3 10*3/uL (ref 4.0–10.5)

## 2015-11-08 LAB — MRSA PCR SCREENING: MRSA by PCR: NEGATIVE

## 2015-11-08 MED ORDER — FAMOTIDINE 20 MG PO TABS
20.0000 mg | ORAL_TABLET | Freq: Every day | ORAL | Status: DC
  Administered 2015-11-08 – 2015-11-16 (×9): 20 mg via ORAL
  Filled 2015-11-08 (×11): qty 1

## 2015-11-08 MED ORDER — ONDANSETRON HCL 4 MG PO TABS: 4.0000 mg | ORAL_TABLET | Freq: Four times a day (QID) | ORAL | Status: DC | PRN

## 2015-11-08 MED ORDER — HYDROMORPHONE HCL 1 MG/ML IJ SOLN: 0.5000 mg | INTRAMUSCULAR | Status: DC | PRN

## 2015-11-08 MED ORDER — ACETAMINOPHEN 650 MG RE SUPP: 650.0000 mg | Freq: Four times a day (QID) | RECTAL | Status: DC | PRN

## 2015-11-08 MED ORDER — FUROSEMIDE 10 MG/ML IJ SOLN
INTRAMUSCULAR | Status: AC
  Administered 2015-11-08: 40 mg
  Filled 2015-11-08: qty 4

## 2015-11-08 MED ORDER — FUROSEMIDE 10 MG/ML IJ SOLN
40.0000 mg | Freq: Two times a day (BID) | INTRAMUSCULAR | Status: DC
  Administered 2015-11-09: 40 mg via INTRAVENOUS
  Filled 2015-11-08 (×4): qty 4

## 2015-11-08 MED ORDER — DIGOXIN 125 MCG PO TABS
0.1250 mg | ORAL_TABLET | Freq: Every day | ORAL | Status: DC
  Administered 2015-11-08 – 2015-11-16 (×9): 0.125 mg via ORAL
  Filled 2015-11-08 (×11): qty 1

## 2015-11-08 MED ORDER — NITROGLYCERIN 0.4 MG SL SUBL: 0.4000 mg | SUBLINGUAL_TABLET | SUBLINGUAL | Status: DC | PRN

## 2015-11-08 MED ORDER — ONDANSETRON HCL 4 MG/2ML IJ SOLN: 4.0000 mg | Freq: Four times a day (QID) | INTRAMUSCULAR | Status: DC | PRN

## 2015-11-08 MED ORDER — FUROSEMIDE 40 MG PO TABS
40.0000 mg | ORAL_TABLET | Freq: Every day | ORAL | Status: DC
  Administered 2015-11-08: 40 mg via ORAL
  Filled 2015-11-08: qty 1

## 2015-11-08 MED ORDER — DEXTROSE 50 % IV SOLN
INTRAVENOUS | Status: AC
  Administered 2015-11-08: 50 mL
  Filled 2015-11-08: qty 50

## 2015-11-08 MED ORDER — LEVOTHYROXINE SODIUM 75 MCG PO TABS
75.0000 ug | ORAL_TABLET | Freq: Every day | ORAL | Status: DC
  Administered 2015-11-08 – 2015-11-15 (×8): 75 ug via ORAL
  Filled 2015-11-08 (×10): qty 1

## 2015-11-08 MED ORDER — AMIODARONE HCL 200 MG PO TABS
400.0000 mg | ORAL_TABLET | Freq: Every day | ORAL | Status: DC
  Administered 2015-11-08 – 2015-11-14 (×7): 400 mg via ORAL
  Filled 2015-11-08 (×9): qty 2

## 2015-11-08 MED ORDER — POTASSIUM CHLORIDE CRYS ER 20 MEQ PO TBCR
20.0000 meq | EXTENDED_RELEASE_TABLET | Freq: Every day | ORAL | Status: DC
  Administered 2015-11-08 – 2015-11-11 (×4): 20 meq via ORAL
  Filled 2015-11-08 (×6): qty 1

## 2015-11-08 MED ORDER — OXYCODONE HCL 5 MG PO TABS
5.0000 mg | ORAL_TABLET | ORAL | Status: DC | PRN
  Administered 2015-11-11: 5 mg via ORAL
  Filled 2015-11-08: qty 1

## 2015-11-08 MED ORDER — ATORVASTATIN CALCIUM 40 MG PO TABS
40.0000 mg | ORAL_TABLET | Freq: Every day | ORAL | Status: DC
  Administered 2015-11-08 – 2015-11-16 (×9): 40 mg via ORAL
  Filled 2015-11-08 (×12): qty 1

## 2015-11-08 MED ORDER — APIXABAN 5 MG PO TABS
5.0000 mg | ORAL_TABLET | Freq: Two times a day (BID) | ORAL | Status: DC
  Administered 2015-11-08 – 2015-11-16 (×17): 5 mg via ORAL
  Filled 2015-11-08 (×20): qty 1

## 2015-11-08 MED ORDER — METOPROLOL TARTRATE 12.5 MG HALF TABLET
12.5000 mg | ORAL_TABLET | Freq: Three times a day (TID) | ORAL | Status: DC
  Administered 2015-11-08: 12.5 mg via ORAL
  Filled 2015-11-08 (×4): qty 1

## 2015-11-08 MED ORDER — ALUM & MAG HYDROXIDE-SIMETH 200-200-20 MG/5ML PO SUSP: 30.0000 mL | Freq: Four times a day (QID) | ORAL | Status: DC | PRN

## 2015-11-08 MED ORDER — ACETAMINOPHEN 325 MG PO TABS: 650.0000 mg | ORAL_TABLET | Freq: Four times a day (QID) | ORAL | Status: DC | PRN

## 2015-11-08 MED ORDER — SACCHAROMYCES BOULARDII 250 MG PO CAPS
250.0000 mg | ORAL_CAPSULE | Freq: Two times a day (BID) | ORAL | Status: DC
  Administered 2015-11-08 – 2015-11-16 (×17): 250 mg via ORAL
  Filled 2015-11-08 (×20): qty 1

## 2015-11-08 MED ORDER — METOPROLOL SUCCINATE ER 50 MG PO TB24
50.0000 mg | ORAL_TABLET | Freq: Two times a day (BID) | ORAL | Status: DC
  Administered 2015-11-08: 50 mg via ORAL
  Filled 2015-11-08 (×3): qty 1

## 2015-11-08 MED ORDER — GLUCOSE 40 % PO GEL: 1.0000 | ORAL | Status: DC | PRN

## 2015-11-08 MED ORDER — ALLOPURINOL 300 MG PO TABS
300.0000 mg | ORAL_TABLET | Freq: Every day | ORAL | Status: DC
  Administered 2015-11-08 – 2015-11-16 (×9): 300 mg via ORAL
  Filled 2015-11-08 (×11): qty 1

## 2015-11-08 MED ORDER — DEXTROSE 10 % IV SOLN
INTRAVENOUS | Status: DC
  Filled 2015-11-08: qty 1000

## 2015-11-08 NOTE — Consult Note (Signed)
Reason for Consult: atrial fib and acute on chronic systolic heart failure  Referring Physician: Dr. Daiva Huge is an 69 y.o. male.   HPI: The patient is a 69 yo man with a longstanding h/o CAD as noted below, s/p CABG. He has a h/o VT and underwent initial ICD implant in 1997. He has had several generator changes. 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 He developed atrial fib several weeks ago with a RVR. He was in his usual state of health until presenting with worsening sob. His blood pressure has been soft, making treatment of his systolic heart failure and atrial fib with a RVR more difficult. While he has chronic dyspnea, he is certain that his symptoms worsened when he went into atrial fib several weeks ago. He has been taking systemic anti-coagulation and has not missed any doses. He has been on amiodarone.   PMH: Past Medical History  Diagnosis Date  . Hyperlipidemia   . Hypertension   . Coronary heart disease     a. s/p CABG x 3 (VG->PDA, VG->PL, LIMA->LAD);  b. 2013 Abnl Myoview;  c. 2013 Cath: 3/3 patent grafts, occluded LCX which correlated w/ ischemia on MV->not amenable to PCI->Med Rx.  . Obesity   . CHF (congestive heart failure) ([place])   . VT (ventricular tachycardia) ([place])   . SVT (supraventricular tachycardia) ([place])   . PAD (peripheral artery disease) ([place])     Angiography in February of 2014: Moderate left iliac artery stenosis, mild to moderate diffuse right SFA disease. Severe proximal right popliteal artery stenosis with three-vessel runoff below the knee. Status post self-expanding stent placement to the proximal popliteal artery  . Implantable cardioverter-defibrillator Mdt   . Asthma     "small touch" (07/29/2013)  . History of pneumonia     "3-4 times; last time ~ 2003" (07/29/2013)  . OSA on CPAP   . GERD (gastroesophageal reflux disease)   . H/O hiatal hernia   . Gout   . Anxiety   .  Allergy   . 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 Type II diabetes mellitus ([place])     on insulin  . Sleep apnea   . Hyperlipidemia   . PAD (peripheral artery disease) ([place])   . CHF (congestive heart failure) ([place])   . A-fib ([place])   . CKD (chronic kidney disease), stage III   . S/P radiation therapy 08/12/15-09/24/15    Ge  junction 54 Gy    PSHX: Past Surgical History  Procedure Laterality Date  . Tonsillectomy    . Cardiac catheterization    . Wrist fracture surgery Right   . Cataract extraction w/ intraocular lens implant Left   . Cardiac defibrillator placement  1997; ~ 2003  . Coronary artery bypass graft  1997; 2007    "X3" (07/29/2013)  . Nerve, tendon and artery repair Left 09/20/2013    Procedure: IRRIGATION AND DEBRIDEMENT,Exploration and Repair of Ulnar/Digital  Artery Nerve of Left Index finger;  Surgeon: Roseanne Kaufman, MD;  Location: [place];  Service: Orthopedics;  Laterality: Left;  . Abdominal aortagram N/A 01/30/2013    Procedure: ABDOMINAL Maxcine Ham;  Surgeon: Wellington Hampshire, MD;  Location: [facility] CATH LAB;  Service: Cardiovascular;  Laterality: N/A;  . Cardiac catheterization N/A 07/22/2015    Procedure: Left Heart Cath and Coronary Angiography;  Surgeon: Peter M Martinique, MD;  Location: [place] CV LAB;  Service: Cardiovascular;  Laterality: N/A;  . Esophagogastroduodenoscopy N/A 07/23/2015    Procedure: ESOPHAGOGASTRODUODENOSCOPY (EGD);  Surgeon: Wonda Horner, MD;  Location: [facility] ENDOSCOPY;  Service: Endoscopy;  Laterality: N/A;  . Tee without cardioversion N/A 10/06/2015    Procedure: TRANSESOPHAGEAL ECHOCARDIOGRAM (TEE);  Surgeon: Pixie Casino, MD;  Location: [facility] ENDOSCOPY;  Service: Cardiovascular;  Laterality: N/A;    FAMHX: Family History  Problem Relation Age of Onset  . Heart disease Brother   . Cancer Brother   . Breast cancer Mother     Social History:  reports that he quit smoking about 19 years ago. His  smoking use included Cigarettes. He has a 60 pack-year smoking history. He has never used smokeless tobacco. He reports that he does not drink alcohol or use illicit drugs.  Allergies:  Allergies  Allergen Reactions  . Levaquin [Levofloxacin In D5w] Nausea And Vomiting  . Adhesive [Tape] Other (See Comments)    Pulls skin off / please use paper tape  . Morphine And Related Nausea And Vomiting    Medications: I have reviewed the patient's current medications.  No results found.  ROS  As stated in the HPI and negative for all other systems.  Physical Exam  Vitals:Blood pressure 118/66, pulse 81, temperature 98.3 F (36.8 C), temperature source Oral, resp. rate 33, height 6' (1.829 m), weight 195 lb 12.3 oz (88.8 kg), SpO2 95 %.  Well appearing NAD HEENT: Unremarkable Neck:  No JVD, no thyromegally Lymphatics:  No adenopathy Back:  No CVA tenderness Lungs:  Clear HEART:  Regular rate rhythm, no murmurs, no rubs, no clicks Abd:  Flat, positive bowel sounds, no organomegally, no rebound, no guarding Ext:  2 plus pulses, no edema, no cyanosis, no clubbing Skin:  No rashes no nodules Neuro:  CN II through XII intact, motor grossly intact  ECG - atrial fib with a RVR/CVR  Assessment/Plan: 1. Acute on chronic systolic/diastolic heart failure 2. Persistent atrial fib with a CVR/RVR 3. adenoCA of GE junction 4. ICM 5. Remote VT, s/p ICD Implant Rec: I have discussed the treatment options. The patient needs DCCV. He is on amio and he admits to being compliant with his blood thinners. Will schedule DCCV tomorrow as schedule allows.  Carleene Overlie TaylorMD 11/08/2015, 1:17 PM

## 2015-11-08 NOTE — Progress Notes (Signed)
Hypoglycemic Event  CBG: 67  Treatment: 15 GM carbohydrate snack  Symptoms: None  Follow-up CBG: Time:0720 CBG Result:80  Possible Reasons for Event: Inadequate meal intake  Comments/MD notified: MD aware

## 2015-11-08 NOTE — Progress Notes (Signed)
Pt had increased SOB at rest; admin O2, notified DR, new orders for lasix & CXR. Will continue to monitor.

## 2015-11-08 NOTE — Progress Notes (Signed)
Hypoglycemic Event  CBG: 50  Treatment: D50 IV 50 mL  Symptoms: None  Follow-up CBG: Time:0545 CBG Result:133  Possible Reasons for Event: Inadequate meal intake  Comments/MD notified: MD aware

## 2015-11-08 NOTE — Progress Notes (Signed)
PT Cancellation Note  Patient Details Name: Walter French MRN: [id number] DOB: 1946-04-16   Cancelled Treatment:    Reason Eval/Treat Not Completed:  (refused due to SOB.) PT to return as able.   Kingsley Callander 11/08/2015, 2:04 PM  Kittie Plater, PT, DPT Pager #: [phone] Office #: [phone]

## 2015-11-08 NOTE — Progress Notes (Signed)
Hypoglycemic Event  CBG: 51  Treatment: 15 GM carbohydrate snack  Symptoms: None  Follow-up CBG: Time:0115 CBG Result:43  Possible Reasons for Event: Inadequate meal intake and Medication regimen: took glipizide PTA  Comments/MD notified:MD aware, reason for admit    Hypoglycemic Event  CBG: 43  Treatment: D50 IV 50 mL  Symptoms: None  Follow-up CBG: [id number] CBG Result:110  Possible Reasons for Event: Inadequate meal intake

## 2015-11-08 NOTE — Progress Notes (Signed)
Walter French [id number] DOB: 08-10-1946 DOA: 11/07/2015 PCP: Karlene Einstein, MD  Brief narrative:  69 y/o ? 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 Was placed on Face mask but refusing it Accessory muscles in full use  no cp Has refused potassium and lasix pills but finally took the lasix after coaxing   Objective    Interim History:   Telemetry: afib rate ctrll   Objective: Filed Vitals:   11/08/15 0050 11/08/15 0330 11/08/15 0722 11/08/15 1135  BP: 94/57 92/47 90/49  118/66  Pulse: 71 76 81   Temp: 97.6 F (36.4 C) 98.2 F (36.8 C) 98.3 F (36.8 C)   TempSrc: Oral Oral Oral   Resp: 36 36 33   Height: 6' (1.829 m)     Weight: 88.8 kg (195 lb 12.3 oz)     SpO2: 91% 92% 95%     Intake/Output Summary (Last 24 hours) at 11/08/15 1159 Last data filed at 11/08/15 0645  Gross per 24 hour  Intake    690 ml  Output    200 ml  Net    490 ml    Exam:  General:  eomi ncat, mild jvd Cardiovascular: s1 s 2no m/r/g Respiratory: crackles throughout Abdomen:  Soft nt nd no rebound Skin: no le edema  Neuro:intact  Data Reviewed: Basic Metabolic Panel:  Recent Labs Lab 11/04/15 1415 11/07/15 2235 11/08/15 0406  NA 130* 132* 130*  K 4.4 4.6 3.8  CL 98* 99* 98*  CO2 22  25 26   GLUCOSE 208* 50* 71  BUN 13 11 10   CREATININE 1.53* 1.21 1.13  CALCIUM 9.0 8.8* 8.5*   Liver Function Tests:  Recent Labs Lab 11/07/15 2235  AST 54*  ALT 20  ALKPHOS 365*  BILITOT 0.9  PROT 6.6  ALBUMIN 2.1*   No results for input(s): LIPASE, AMYLASE in the last 168 hours. No results for input(s): AMMONIA in the last 168 hours. CBC:  Recent Labs Lab 11/07/15 2235 11/08/15 0406  WBC 7.9 6.3  NEUTROABS 6.4  --   HGB 9.7* 8.1*  HCT 29.9* 24.8*  MCV 86.9 86.7  PLT 171 131*   Cardiac Enzymes: No results for input(s): CKTOTAL, CKMB, CKMBINDEX, TROPONINI in the last 168 hours. BNP: Invalid input(s): POCBNP CBG:  Recent Labs Lab 11/08/15 0420 11/08/15 0528 11/08/15 0547 11/08/15 0649 11/08/15 0721  GLUCAP 80 50* 133* 67 80    Recent Results (from the past 240 hour(s))  MRSA PCR Screening     Status: None   Collection Time: 11/08/15  1:11 AM  Result Value Ref Range Status   MRSA by PCR NEGATIVE NEGATIVE Final    Comment:        The GeneXpert MRSA Assay (FDA approved for NASAL specimens only), is one component  of a comprehensive MRSA colonization surveillance program. It is not intended to diagnose MRSA infection nor to guide or monitor treatment for MRSA infections.      Studies:              All Imaging reviewed and is as per above notation   Scheduled Meds: . allopurinol  300 mg Oral Daily  . amiodarone  400 mg Oral Daily  . apixaban  5 mg Oral BID  . atorvastatin  40 mg Oral Daily  . digoxin  0.125 mg Oral Daily  . famotidine  20 mg Oral Daily  . furosemide  40 mg Intravenous Q12H  . levothyroxine  75 mcg Oral QAC breakfast  . metoprolol tartrate  12.5 mg Oral TID WC  . potassium chloride SA  20 mEq Oral Daily  . saccharomyces boulardii  250 mg Oral BID   Continuous Infusions: . dextrose 75 mL (11/08/15 1041)     Assessment/Plan:  1. Severe hypoglycemia-hold glipizide 10, continue d10 @ 75cc/h.  Allow snacks and glucose gel.   monitor on SDU 2. Decompensated HF-crackles and rales noted.  Diurese lasix 40 bid.  held BB Metoprolol XL.  change to po metoprolol 12.5 tid. 3. Afib [id number] score= complicated by hypotension-see above med adjustments.  We will ask cardiology for assistance.  Cont Amiodarone 400 od, digoxin 0.125 od.  Cont apixaban 5 bid. 4. Hypothyroid-cont levothyroxine 75 mcg daily.  TSH needed in 2-3 weeks 5. Throat cancer-wonder if this is affecting his overall swallowing-last weight was 206 in office and has gone down to 295 on admission here 6. hld-continue atorvastatin 40 daily 7. Gout-cont allopurinol 300 daily  Code Status: presumed full Family Communication: discussed with daughter on phone 11/27 Disposition Plan: inpatient  Verneita Griffes, MD  [facility] Pager [phone] 11/08/2015, 11:59 AM    LOS: 1 day

## 2015-11-08 NOTE — Progress Notes (Signed)
Hypoglycemic Event  CBG: 53  Treatment: D50 IV 50 mL  Symptoms: None  Follow-up CBG: Time:0325 CBG Result:123  Possible Reasons for Event: Inadequate meal intake  Comments/MD notified:MD aware

## 2015-11-09 ENCOUNTER — Inpatient Hospital Stay (HOSPITAL_COMMUNITY): Payer: Medicare Other | Admitting: Anesthesiology

## 2015-11-09 ENCOUNTER — Inpatient Hospital Stay (HOSPITAL_COMMUNITY): Payer: Medicare Other

## 2015-11-09 ENCOUNTER — Inpatient Hospital Stay (HOSPITAL_COMMUNITY): Admission: RE | Admit: 2015-11-09 | Payer: Medicare Other | Source: Ambulatory Visit

## 2015-11-09 ENCOUNTER — Encounter (HOSPITAL_COMMUNITY): Payer: Self-pay | Admitting: *Deleted

## 2015-11-09 ENCOUNTER — Encounter (HOSPITAL_COMMUNITY)
Admission: EM | Disposition: A | Discharge status: Home Health | Payer: Self-pay | Source: Home / Self Care | Attending: Internal Medicine

## 2015-11-09 DIAGNOSIS — Z7901 Long term (current) use of anticoagulants: Secondary | ICD-10-CM

## 2015-11-09 DIAGNOSIS — I5042 Chronic combined systolic (congestive) and diastolic (congestive) heart failure: Secondary | ICD-10-CM

## 2015-11-09 DIAGNOSIS — E162 Hypoglycemia, unspecified: Secondary | ICD-10-CM

## 2015-11-09 DIAGNOSIS — I2510 Atherosclerotic heart disease of native coronary artery without angina pectoris: Secondary | ICD-10-CM

## 2015-11-09 HISTORY — PX: CARDIOVERSION: SHX1299

## 2015-11-09 LAB — BLOOD GAS, ARTERIAL
Acid-Base Excess: 2.2 mmol/L — ABNORMAL HIGH (ref 0.0–2.0)
Bicarbonate: 25.1 mEq/L — ABNORMAL HIGH (ref 20.0–24.0)
DRAWN BY: 24486
O2 CONTENT: 3 L/min
O2 Saturation: 90.4 %
PH ART: 7.512 — AB (ref 7.350–7.450)
Patient temperature: 98.6
TCO2: 26 mmol/L (ref 0–100)
pCO2 arterial: 31.5 mmHg — ABNORMAL LOW (ref 35.0–45.0)
pO2, Arterial: 59.4 mmHg — ABNORMAL LOW (ref 80.0–100.0)

## 2015-11-09 LAB — COMPREHENSIVE METABOLIC PANEL
ALT: 19 U/L (ref 17–63)
AST: 45 U/L — ABNORMAL HIGH (ref 15–41)
Albumin: 2 g/dL — ABNORMAL LOW (ref 3.5–5.0)
Alkaline Phosphatase: 345 U/L — ABNORMAL HIGH (ref 38–126)
Anion gap: 10 (ref 5–15)
BUN: 11 mg/dL (ref 6–20)
CHLORIDE: 93 mmol/L — AB (ref 101–111)
CO2: 25 mmol/L (ref 22–32)
CREATININE: 1.17 mg/dL (ref 0.61–1.24)
Calcium: 8.4 mg/dL — ABNORMAL LOW (ref 8.9–10.3)
GFR calc Af Amer: >60 mL/min (ref >60)
GFR calc non Af Amer: >60 mL/min (ref >60)
GLUCOSE: 167 mg/dL — AB (ref 65–99)
POTASSIUM: 3.4 mmol/L — AB (ref 3.5–5.1)
Sodium: 128 mmol/L — ABNORMAL LOW (ref 135–145)
Total Bilirubin: 1.4 mg/dL — ABNORMAL HIGH (ref 0.3–1.2)
Total Protein: 6.7 g/dL (ref 6.5–8.1)

## 2015-11-09 LAB — GLUCOSE, CAPILLARY
GLUCOSE-CAPILLARY: 139 mg/dL — AB (ref 65–99)
GLUCOSE-CAPILLARY: 119 mg/dL — AB (ref 65–99)
GLUCOSE-CAPILLARY: 132 mg/dL — AB (ref 65–99)
Glucose-Capillary: 133 mg/dL — ABNORMAL HIGH (ref 65–99)
Glucose-Capillary: 136 mg/dL — ABNORMAL HIGH (ref 65–99)
Glucose-Capillary: 151 mg/dL — ABNORMAL HIGH (ref 65–99)
Glucose-Capillary: 177 mg/dL — ABNORMAL HIGH (ref 65–99)
Glucose-Capillary: 226 mg/dL — ABNORMAL HIGH (ref 65–99)
Glucose-Capillary: 51 mg/dL — ABNORMAL LOW (ref 65–99)

## 2015-11-09 LAB — HEMOGLOBIN A1C
HEMOGLOBIN A1C: 5.5 % (ref 4.8–5.6)
MEAN PLASMA GLUCOSE: 111 mg/dL

## 2015-11-09 LAB — TSH: TSH: 0.588 u[IU]/mL (ref 0.350–4.500)

## 2015-11-09 LAB — CBC
HEMATOCRIT: 29.7 % — AB (ref 39.0–52.0)
HEMOGLOBIN: 9.8 g/dL — AB (ref 13.0–17.0)
MCH: 28.1 pg (ref 26.0–34.0)
MCHC: 33 g/dL (ref 30.0–36.0)
MCV: 85.1 fL (ref 78.0–100.0)
Platelets: 154 10*3/uL (ref 150–400)
RBC: 3.49 MIL/uL — ABNORMAL LOW (ref 4.22–5.81)
RDW: 17.3 % — ABNORMAL HIGH (ref 11.5–15.5)
WBC: 7.4 10*3/uL (ref 4.0–10.5)

## 2015-11-09 LAB — BRAIN NATRIURETIC PEPTIDE: B NATRIURETIC PEPTIDE 5: 723.3 pg/mL — AB (ref 0.0–100.0)

## 2015-11-09 SURGERY — CARDIOVERSION
Anesthesia: Monitor Anesthesia Care

## 2015-11-09 MED ORDER — FUROSEMIDE 10 MG/ML IJ SOLN
40.0000 mg | Freq: Three times a day (TID) | INTRAMUSCULAR | Status: DC
  Administered 2015-11-09 – 2015-11-10 (×3): 40 mg via INTRAVENOUS
  Filled 2015-11-09 (×4): qty 4

## 2015-11-09 MED ORDER — PROPOFOL 10 MG/ML IV BOLUS
INTRAVENOUS | Status: DC | PRN
  Administered 2015-11-09: 70 mg via INTRAVENOUS

## 2015-11-09 MED ORDER — LIDOCAINE HCL (CARDIAC) 20 MG/ML IV SOLN
INTRAVENOUS | Status: DC | PRN
  Administered 2015-11-09: 60 mg via INTRAVENOUS

## 2015-11-09 MED ORDER — FUROSEMIDE 10 MG/ML IJ SOLN: 40.0000 mg | Freq: Once | INTRAMUSCULAR | Status: AC

## 2015-11-09 MED ORDER — SODIUM CHLORIDE 0.9 % IV SOLN
INTRAVENOUS | Status: DC | PRN
  Administered 2015-11-09: 13:00:00 via INTRAVENOUS

## 2015-11-09 MED ORDER — METOPROLOL TARTRATE 12.5 MG HALF TABLET
12.5000 mg | ORAL_TABLET | Freq: Two times a day (BID) | ORAL | Status: DC
  Administered 2015-11-09 – 2015-11-14 (×11): 12.5 mg via ORAL
  Filled 2015-11-09 (×11): qty 1

## 2015-11-09 MED ORDER — CETYLPYRIDINIUM CHLORIDE 0.05 % MT LIQD
7.0000 mL | Freq: Two times a day (BID) | OROMUCOSAL | Status: DC
  Administered 2015-11-09 – 2015-11-16 (×11): 7 mL via OROMUCOSAL

## 2015-11-09 NOTE — Consult Note (Signed)
Referring Physician: Dr. Daiva Huge is an 69 y.o. male.   HPI: The patient is a 69 yo man with a longstanding h/o CAD as noted below, s/p CABG. He has a h/o VT and underwent initial ICD implant in 1997. He has had several generator changes. 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 He developed atrial fib several weeks ago with a RVR. He was in his usual state of health until presenting with worsening sob. His blood pressure has been soft, making treatment of his systolic heart failure and atrial fib with a RVR more difficult. While he has chronic dyspnea, he is certain that his symptoms worsened when he went into atrial fib several weeks ago. He has been taking systemic anti-coagulation and has not missed any doses. He has been on amiodarone.   PMH: Past Medical History  Diagnosis Date  . Hyperlipidemia   . Hypertension   . Coronary heart disease     a. s/p CABG x 3 (VG->PDA, VG->PL, LIMA->LAD);  b. 2013 Abnl Myoview;  c. 2013 Cath: 3/3 patent grafts, occluded LCX which correlated w/ ischemia on MV->not amenable to PCI->Med Rx.  . Obesity   . CHF (congestive heart failure) ([place])   . VT (ventricular tachycardia) ([place])   . SVT (supraventricular tachycardia) ([place])   . PAD (peripheral artery disease) ([place])     Angiography in February of 2014: Moderate left iliac artery stenosis, mild to moderate diffuse right SFA disease. Severe proximal right popliteal artery stenosis with three-vessel runoff below the knee. Status post self-expanding stent placement to the proximal popliteal artery  . Implantable cardioverter-defibrillator Mdt   . Asthma     "small touch" (07/29/2013)  . History of pneumonia     "3-4 times; last time ~ 2003" (07/29/2013)  . OSA on CPAP   . GERD (gastroesophageal reflux disease)   . H/O hiatal hernia   . Gout   . Anxiety   . Allergy   . 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 Type II diabetes mellitus ([place])     on insulin  . Sleep apnea   . Hyperlipidemia   . PAD (peripheral artery disease) ([place])   . CHF (congestive heart failure) ([place])   . A-fib ([place])   . CKD (chronic kidney disease), stage III   . S/P radiation therapy 08/12/15-09/24/15    Ge  junction 54 Gy    PSHX: Past Surgical History  Procedure Laterality Date  . Tonsillectomy    . Cardiac catheterization    . Wrist fracture surgery Right   . Cataract extraction w/ intraocular lens implant Left   . Cardiac defibrillator placement  1997; ~ 2003  . Coronary artery bypass graft  1997; 2007    "X3" (07/29/2013)  . Nerve, tendon and artery repair Left 09/20/2013    Procedure: IRRIGATION AND DEBRIDEMENT,Exploration and Repair of Ulnar/Digital  Artery Nerve of Left Index finger;  Surgeon: Roseanne Kaufman, MD;  Location: [place];  Service: Orthopedics;  Laterality: Left;  . Abdominal aortagram N/A 01/30/2013    Procedure: ABDOMINAL Maxcine Ham;  Surgeon: Wellington Hampshire, MD;  Location: [facility] CATH LAB;  Service: Cardiovascular;  Laterality: N/A;  . Cardiac catheterization N/A 07/22/2015    Procedure: Left Heart Cath and Coronary Angiography;  Surgeon: Peter M Martinique, MD;  Location: [place] CV LAB;  Service: Cardiovascular;  Laterality: N/A;  .  Esophagogastroduodenoscopy N/A 07/23/2015    Procedure: ESOPHAGOGASTRODUODENOSCOPY (EGD);  Surgeon: Wonda Horner, MD;  Location: [facility] ENDOSCOPY;  Service: Endoscopy;  Laterality: N/A;  . Tee without cardioversion N/A 10/06/2015    Procedure: TRANSESOPHAGEAL ECHOCARDIOGRAM (TEE);  Surgeon: Pixie Casino, MD;  Location: [facility] ENDOSCOPY;  Service: Cardiovascular;  Laterality: N/A;    FAMHX: Family History  Problem Relation Age of Onset  . Heart disease Brother   . Cancer Brother   . Breast cancer Mother     Social History:  reports that he quit smoking about 19 years ago. His smoking use included Cigarettes. He has a 60 pack-year smoking history.  He has never used smokeless tobacco. He reports that he does not drink alcohol or use illicit drugs.  Allergies:  Allergies  Allergen Reactions  . Levaquin [Levofloxacin In D5w] Nausea And Vomiting  . Adhesive [Tape] Other (See Comments)    Pulls skin off / please use paper tape  . Morphine And Related Nausea And Vomiting    Medications: I have reviewed the patient's current medications.  Dg Chest 2 View  11/08/2015  CLINICAL DATA:  Shortness of breath. Pt in afib and desatting during the exam. EXAM: CHEST  2 VIEW COMPARISON:  10/05/2015 FINDINGS: Stable cardiac enlargement and cardiac pacer. Low lung volumes. Severe bilateral interstitial change. Retrocardiac consolidation. IMPRESSION: Findings suggest congestive heart failure with severe interstitial edema Electronically Signed   By: Skipper Cliche M.D.   On: 11/08/2015 14:12   Dg Chest Port 1 View  11/09/2015  CLINICAL DATA:  Congestive heart failure. EXAM: PORTABLE CHEST 1 VIEW COMPARISON:  11/08/2015 at 13:30 FINDINGS: There is partial clearance of vascular and interstitial congestive changes. There continues to be mild ground-glass opacity in the medial base regions. Probable small left pleural effusion. Grossly intact appearances of the transvenous cardiac lead. IMPRESSION: Slight improvement with partial clearance of congestive changes compared to the earlier study Electronically Signed   By: Andreas Newport M.D.   On: 11/09/2015 01:52    ROS  As stated in the HPI and negative for all other systems.  Physical Exam  Vitals:Blood pressure 102/54, pulse 79, temperature 97.6 F (36.4 C), temperature source Oral, resp. rate 26, height 6' (1.829 m), weight 88.8 kg (195 lb 12.3 oz), SpO2 100 %.  Well appearing NAD HEENT: Unremarkable Neck:  No JVD, no thyromegally Lymphatics:  No adenopathy Back:  No CVA tenderness Lungs:  Bibasilar crackles HEART:  Regular rate rhythm, no murmurs, no rubs, no clicks Abd:  Flat, positive  bowel sounds, no organomegally, no rebound, no guarding Ext:  2 plus pulses, no edema, no cyanosis, no clubbing Skin:  No rashes no nodules Neuro:  CN II through XII intact, motor grossly intact  Telemetry: afib with controlled rate.   Assessment/Plan: 1. Acute on chronic systolic/diastolic heart failure 2. Persistent atrial fib with a CVR/RVR 3. adenoCA of GE junction 4. ICM 5. Remote VT, s/p ICD Implant 6. Hypoglycemia- per primary team.  Rec: I have discussed the treatment options. The patient is scheduled for DCCV today. He is on amio and he admits to being compliant with his blood thinners.   Collier Salina JordanMD 11/09/2015, 11:44 AM

## 2015-11-09 NOTE — Progress Notes (Signed)
UR COMPLETED  

## 2015-11-09 NOTE — Progress Notes (Signed)
Walter French [id number] DOB: 1946-01-06 DOA: 11/07/2015 PCP: Karlene Einstein, MD  Brief narrative:  69 y/o ? 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 Multiple questions about cardioversion and when it will be No overt shortness of breath nausea vomiting Blood sugars have improved    Objective    Interim History:   Telemetry: afib rate ctrll   Objective: Filed Vitals:   11/08/15 1500 11/08/15 1949 11/09/15 0017 11/09/15 0435  BP: 95/53 99/56 112/63 101/63  Pulse:  85 78 88  Temp: 99.6 F (37.6 C) 98.2 F (36.8 C) 97.8 F (36.6 C) 97.3 F (36.3 C)  TempSrc: Oral Oral Oral Oral  Resp:  30 34 17  Height:      Weight:      SpO2:  92% 90% 93%    Intake/Output Summary (Last 24 hours) at 11/09/15 0823 Last data filed at 11/09/15 0442  Gross per 24 hour  Intake 2148.75 ml  Output   3100 ml  Net -951.25 ml    Exam:  General:  eomi ncat, JVD improved Cardiovascular: s1 s 2no m/r/g Respiratory: crackles better Abdomen:  Soft nt nd no rebound Skin: no le edema  Neuro:intact  Data Reviewed: Basic Metabolic Panel:  Recent Labs Lab 11/04/15 1415 11/07/15 2235 11/08/15 0406  11/09/15 0130  NA 130* 132* 130* 128*  K 4.4 4.6 3.8 3.4*  CL 98* 99* 98* 93*  CO2 22 25 26 25   GLUCOSE 208* 50* 71 167*  BUN 13 11 10 11   CREATININE 1.53* 1.21 1.13 1.17  CALCIUM 9.0 8.8* 8.5* 8.4*   Liver Function Tests:  Recent Labs Lab 11/07/15 2235 11/09/15 0130  AST 54* 45*  ALT 20 19  ALKPHOS 365* 345*  BILITOT 0.9 1.4*  PROT 6.6 6.7  ALBUMIN 2.1* 2.0*   No results for input(s): LIPASE, AMYLASE in the last 168 hours. No results for input(s): AMMONIA in the last 168 hours. CBC:  Recent Labs Lab 11/07/15 2235 11/08/15 0406 11/09/15 0130  WBC 7.9 6.3 7.4  NEUTROABS 6.4  --   --   HGB 9.7* 8.1* 9.8*  HCT 29.9* 24.8* 29.7*  MCV 86.9 86.7 85.1  PLT 171 131* 154   Cardiac Enzymes: No results for input(s): CKTOTAL, CKMB, CKMBINDEX, TROPONINI in the last 168 hours. BNP: Invalid input(s): POCBNP CBG:  Recent Labs Lab 11/08/15 1133 11/08/15 1506 11/08/15 1951 11/09/15 0018 11/09/15 0436  GLUCAP 63* 114* 179* 177* 151*    Recent Results (from the past 240 hour(s))  MRSA PCR Screening     Status: None   Collection Time: 11/08/15  1:11 AM  Result Value Ref Range Status   MRSA by PCR NEGATIVE NEGATIVE Final  Comment:        The GeneXpert MRSA Assay (FDA approved for NASAL specimens only), is one component of a comprehensive MRSA colonization surveillance program. It is not intended to diagnose MRSA infection nor to guide or monitor treatment for MRSA infections.      Studies:              All Imaging reviewed and is as per above notation   Scheduled Meds: . allopurinol  300 mg Oral Daily  . amiodarone  400 mg Oral Daily  . antiseptic oral rinse  7 mL Mouth Rinse BID  . apixaban  5 mg Oral BID  . atorvastatin  40 mg Oral Daily  . digoxin  0.125 mg Oral Daily  . famotidine  20 mg Oral Daily  . furosemide  40 mg Intravenous Q12H  . levothyroxine  75 mcg Oral QAC breakfast  . metoprolol tartrate  12.5 mg Oral TID WC  . potassium chloride  SA  20 mEq Oral Daily  . saccharomyces boulardii  250 mg Oral BID   Continuous Infusions:     Assessment/Plan:  1. Severe hypoglycemia-hold glipizide 10, no insulin on discharge-this problem is resolved 2. Decompensated HF-crackles and rales noted.  Overnight desaturations as well.  cxr o/n 11/28 showed some mild improvement in cxr and given   Diurese lasix 40 bid-->IV tid.  Difficult to use larger doses as becomes hypotensive.  held BB Metoprolol XL.  change back po metoprolol 12.5 from tid to bid 11/28 3. Afib A999333 score= complicated by hypotension-see above med adjustments.  ? For DCCV today-cardiology to comment.  Cont Amiodarone 400 od, digoxin 0.125 od.  Cont apixaban 5 bid. 4. Hypothyroid-cont levothyroxine 75 mcg daily.  TSH needed in 2-3 weeks 5. Throat cancer-wonder if this is affecting his overall swallowing-last weight was 306 in office and has gone down to 295 on admission here 6. hld-continue atorvastatin 40 daily 7. Gout-cont allopurinol 300 daily  Code Status: presumed full Family Communication: No family present at bedside today 7/28 Disposition Plan: inpatient-pending cardioversion-keep on step down post cardioversion and then can transfer probably to telemetry as per cardiology  Verneita Griffes, MD  [facility] Pager [phone] 11/09/2015, 8:23 AM    LOS: 2 days

## 2015-11-09 NOTE — Progress Notes (Signed)
Dr. Wynonia Lawman notified about patient's change in condition. Lungs clear. HR Afib 80s. Scheduled IV Lasix given with good UOP. New orders to give another dose of Lasix 40mg  IV. Will continue to monitor.

## 2015-11-09 NOTE — Progress Notes (Signed)
Notified L. Harduk of ABG results and pt declining Bipap. Told to increase oxygen as patient tolerates. Will continue to monitor.

## 2015-11-09 NOTE — Anesthesia Postprocedure Evaluation (Signed)
Anesthesia Post Note  Patient: Walter French  Procedure(s) Performed: Procedure(s) (LRB): CARDIOVERSION (N/A)  Patient location during evaluation: PACU Anesthesia Type: MAC Level of consciousness: awake and alert Pain management: pain level controlled Vital Signs Assessment: post-procedure vital signs reviewed and stable Respiratory status: spontaneous breathing, nonlabored ventilation, respiratory function stable and patient connected to nasal cannula oxygen Cardiovascular status: stable and blood pressure returned to baseline Anesthetic complications: no    Last Vitals:  Filed Vitals:   11/09/15 1405 11/09/15 1410  BP:  95/55  Pulse: 79 77  Temp:    Resp: 30 32    Last Pain:  Filed Vitals:   11/09/15 1414  PainSc: Asleep                 Effie Berkshire

## 2015-11-09 NOTE — H&P (Signed)
     INTERVAL PROCEDURE H&P  History and Physical Interval Note:  11/09/2015 1:48 PM  [name] has presented today for their planned procedure. The various methods of treatment have been discussed with the patient and family. After consideration of risks, benefits and other options for treatment, the patient has consented to the procedure.  The patients' outpatient history has been reviewed, patient examined, and no change in status from most recent office note within the past 30 days. I have reviewed the patients' chart and labs and will proceed as planned. Questions were answered to the patient's satisfaction.   Pixie Casino, MD, [facility] Attending Cardiologist [facility]  Pixie Casino 11/09/2015, 1:48 PM

## 2015-11-09 NOTE — CV Procedure (Signed)
    CARDIOVERSION NOTE  Procedure: Electrical Cardioversion Indications:  Atrial Fibrillation  Procedure Details:  Consent: Risks of procedure as well as the alternatives and risks of each were explained to the (patient/caregiver).  Consent for procedure obtained.  Time Out: Verified patient identification, verified procedure, site/side was marked, verified correct patient position, special equipment/implants available, medications/allergies/relevent history reviewed, required imaging and test results available.  Performed  Patient placed on cardiac monitor, pulse oximetry, supplemental oxygen as necessary.  Sedation given: Propofol per anesthesia Pacer pads placed anterior and posterior chest.  Cardioverted 1 time(s).  Cardioverted at 120J biphasic.  Impression: Findings: Post procedure EKG shows: NSR Complications: None Patient did tolerate procedure well.  Plan: 1. Successful DCCV with a single 120J biphasic shock to NSR.  Time Spent Directly with the Patient:  30 minutes   Pixie Casino, MD, [facility] Attending Cardiologist [place] 11/09/2015, 2:02 PM

## 2015-11-09 NOTE — Progress Notes (Signed)
Patient refused BIPAP for the night. Patient's oxygen set at 4lpm with Sp02=93%,will continue to monitor patients respiratory status.

## 2015-11-09 NOTE — Anesthesia Preprocedure Evaluation (Signed)
Anesthesia Evaluation  Patient identified by MRN, date of birth, ID band Patient awake    Reviewed: Allergy & Precautions, NPO status , Patient's Chart, lab work & pertinent test results  Airway Mallampati: I       Dental  (+) Edentulous Upper, Edentulous Lower   Pulmonary asthma , sleep apnea , former smoker,     + wheezing      Cardiovascular hypertension, + CAD, + CABG, + Peripheral Vascular Disease and +CHF  + dysrhythmias Atrial Fibrillation, Supra Ventricular Tachycardia and Ventricular Tachycardia + Cardiac Defibrillator  Rhythm:Irregular     Neuro/Psych PSYCHIATRIC DISORDERS Anxiety negative neurological ROS     GI/Hepatic hiatal hernia, GERD  Medicated,  Endo/Other  diabetesHypothyroidism   Renal/GU CRFRenal disease  negative genitourinary   Musculoskeletal   Abdominal   Peds negative pediatric ROS (+)  Hematology   Anesthesia Other Findings   Reproductive/Obstetrics                             Lab Results  Component Value Date   WBC 7.4 11/09/2015   HGB 9.8* 11/09/2015   HCT 29.7* 11/09/2015   MCV 85.1 11/09/2015   PLT 154 11/09/2015   Lab Results  Component Value Date   CREATININE 1.17 11/09/2015   BUN 11 11/09/2015   NA 128* 11/09/2015   K 3.4* 11/09/2015   CL 93* 11/09/2015   CO2 25 11/09/2015   Lab Results  Component Value Date   INR 1.54* 10/01/2015   INR 1.06 09/10/2015   INR 1.05 07/20/2015     Anesthesia Physical Anesthesia Plan  ASA: III  Anesthesia Plan: MAC   Post-op Pain Management:    Induction: Intravenous  Airway Management Planned: Mask  Additional Equipment:   Intra-op Plan:   Post-operative Plan:   Informed Consent: I have reviewed the patients History and Physical, chart, labs and discussed the procedure including the risks, benefits and alternatives for the proposed anesthesia with the patient or authorized representative who  has indicated his/her understanding and acceptance.     Plan Discussed with: CRNA  Anesthesia Plan Comments:         Anesthesia Quick Evaluation

## 2015-11-09 NOTE — Progress Notes (Signed)
PT Cancellation Note  Patient Details Name: Walter French MRN: [id number] DOB: Feb 28, 1946   Cancelled Treatment:    Reason Eval/Treat Not Completed: Patient at procedure or test/unavailable.  Pt at Cardioversion.  Will f/u another time.     Damarius Karnes, Thornton Papas 11/09/2015, 2:12 PM

## 2015-11-09 NOTE — Transfer of Care (Signed)
Immediate Anesthesia Transfer of Care Note  Patient: Walter French  Procedure(s) Performed: Procedure(s): CARDIOVERSION (N/A)  Patient Location: PACU  Anesthesia Type:General  Level of Consciousness: awake, alert , oriented and patient cooperative  Airway & Oxygen Therapy: Patient Spontanous Breathing and Patient connected to nasal cannula oxygen  Post-op Assessment: Report given to RN, Post -op Vital signs reviewed and stable and Patient moving all extremities  Post vital signs: Reviewed and stable  Last Vitals:  Filed Vitals:   11/09/15 1125 11/09/15 1250  BP: 93/62 108/66  Pulse: 73 79  Temp:  36.2 C  Resp: 29 22    Complications: No apparent anesthesia complications

## 2015-11-09 NOTE — Progress Notes (Signed)
Patient having increased respiratory effort and tachypnea at rest. L. Harduk notified. New orders received for Bipap and ABG. Will call cardiology for update on patient's condition and for new orders. L. Harduk also updated about patient's CBG being 170s past 4 hours. Order to [facility]. RT notified about ABG and Bipap.

## 2015-11-09 NOTE — Progress Notes (Signed)
Initial Nutrition Assessment  DOCUMENTATION CODES:   Not applicable  INTERVENTION:    Diet advancement as able per MD.   Recommend add Ensure Enlive po BID when diet is advanced, each supplement provides 350 kcal and 20 grams of protein.  NUTRITION DIAGNOSIS:   Inadequate oral intake related to inability to eat as evidenced by NPO status.  GOAL:   Patient will meet greater than or equal to 90% of their needs  MONITOR:   Diet advancement, PO intake, Labs, Weight trends, I & O's  REASON FOR ASSESSMENT:   Malnutrition Screening Tool    ASSESSMENT:   69 y.o. male with a history of Gastroesophageal Cancer , CAD, CHF with EF= 30-35%. Persistent Atrial Fibrillation on Eliquis Rx, DM2, who presents to the ED with complaints of low blood sugars off and on for the past 2 weeks. He reports a poor appetite for the past 6 weeks following his last chemotherapy. He reports that he was taken off his Insulin therapy by his PCP, and he remained on his oral Diabetic Rx but he has still had low blood sugars. Night of admission his glucose level was 38, and he was given IV Dextrose several times in the ED and his glucose continued to drop back down so he was placed on IV D10 drip and referred for admission.  Patient not in room during RD visit, gone for electrical cardioversion. Patient with significant 14% weight loss over the past 1.5 months. Per H&P, he has been eating poorly since his last chemotherapy ~6 weeks ago.  Unable to complete Nutrition-Focused physical exam at this time.   Diet Order:  Diet NPO time specified  Skin:  Reviewed, no issues  Last BM:  11/27  Height:   Ht Readings from Last 1 Encounters:  11/08/15 6' (1.829 m)    Weight:   Wt Readings from Last 1 Encounters:  11/08/15 195 lb 12.3 oz (88.8 kg)    Ideal Body Weight:  80.9 kg  BMI:  Body mass index is 26.55 kg/(m^2).  Estimated Nutritional Needs:   Kcal:  2200-2400  Protein:  110-130  gm  Fluid:  2.2-2.4 L  EDUCATION NEEDS:   No education needs identified at this time  Molli Barrows, [place], [place], [place] Pager [phone] After Hours Pager [phone]

## 2015-11-10 ENCOUNTER — Encounter (HOSPITAL_COMMUNITY): Payer: Self-pay | Admitting: Internal Medicine

## 2015-11-10 DIAGNOSIS — I25810 Atherosclerosis of coronary artery bypass graft(s) without angina pectoris: Secondary | ICD-10-CM

## 2015-11-10 LAB — GLUCOSE, CAPILLARY
GLUCOSE-CAPILLARY: 197 mg/dL — AB (ref 65–99)
GLUCOSE-CAPILLARY: 139 mg/dL — AB (ref 65–99)
Glucose-Capillary: 184 mg/dL — ABNORMAL HIGH (ref 65–99)
Glucose-Capillary: 167 mg/dL — ABNORMAL HIGH (ref 65–99)
Glucose-Capillary: 164 mg/dL — ABNORMAL HIGH (ref 65–99)

## 2015-11-10 LAB — BASIC METABOLIC PANEL
Anion gap: 10 (ref 5–15)
BUN: 16 mg/dL (ref 6–20)
CHLORIDE: 93 mmol/L — AB (ref 101–111)
CO2: 29 mmol/L (ref 22–32)
Calcium: 8.6 mg/dL — ABNORMAL LOW (ref 8.9–10.3)
Creatinine, Ser: 1.17 mg/dL (ref 0.61–1.24)
GFR calc Af Amer: >60 mL/min (ref >60)
GFR calc non Af Amer: >60 mL/min (ref >60)
Glucose, Bld: 150 mg/dL — ABNORMAL HIGH (ref 65–99)
POTASSIUM: 3.4 mmol/L — AB (ref 3.5–5.1)
SODIUM: 132 mmol/L — AB (ref 135–145)

## 2015-11-10 LAB — MAGNESIUM: MAGNESIUM: 2.1 mg/dL (ref 1.7–2.4)

## 2015-11-10 MED ORDER — METOPROLOL TARTRATE 25 MG PO TABS: 12.5000 mg | ORAL_TABLET | Freq: Two times a day (BID) | ORAL | Status: DC

## 2015-11-10 MED ORDER — GLIPIZIDE 5 MG PO TABS: ORAL_TABLET | ORAL | Status: DC

## 2015-11-10 MED ORDER — FUROSEMIDE 40 MG PO TABS
40.0000 mg | ORAL_TABLET | Freq: Two times a day (BID) | ORAL | Status: DC
  Administered 2015-11-10 – 2015-11-11 (×2): 40 mg via ORAL
  Filled 2015-11-10 (×2): qty 1

## 2015-11-10 MED ORDER — FUROSEMIDE 40 MG PO TABS: 40.0000 mg | ORAL_TABLET | Freq: Two times a day (BID) | ORAL | Status: DC

## 2015-11-10 NOTE — Progress Notes (Signed)
SATURATION QUALIFICATIONS: (This note is used to comply with regulatory documentation for home oxygen)  Patient Saturations on Room Air at Rest =90%  Patient Saturations on Room Air while Ambulating = 82%  Patient Saturations on 6 Liters of oxygen while Ambulating = 88%  Please briefly explain why patient needs home oxygen:Pt unable to ambulate without getting sob and O2 sats dropping below 90%

## 2015-11-10 NOTE — Care Management Important Message (Signed)
Important Message  Patient Details  Name: Walter French MRN: [id number] Date of Birth: 07-25-1946   Medicare Important Message Given:  Yes    Nathen May 11/10/2015, 12:20 PM

## 2015-11-10 NOTE — Progress Notes (Signed)
Note patient still req relatively high oxygen settings and not really ambualtory without We will attempt further diuresis today and re-evalaute PT to reassess as might need higher LOC than can be given by [facility] and may require SNF placement Will tx to Tele and reasess and expect maybe 1-2 more LOS in hospital to sort out hypoxia  Verneita Griffes, MD [facility] [phone]

## 2015-11-10 NOTE — Progress Notes (Signed)
Patient does not wish to wear a CPAP tonight. Stated he doesn't wear one all the time. Patient was resting comfortably before. Patient is hooked up to pulse ox. SPO2 99% and HR 71 on 6 LPM nasal cannula. No distress noted. RT will continue to monitor.

## 2015-11-10 NOTE — Progress Notes (Signed)
Inpatient Diabetes Program Recommendations  AACE/ADA: New Consensus Statement on Inpatient Glycemic Control (2015)  Target Ranges:  Prepandial:   less than 140 mg/dL      Peak postprandial:   less than 180 mg/dL (1-2 hours)      Critically ill patients:  140 - 180 mg/dL   Review of Glycemic Control  Results for Walter French, Walter French (MRN [id number]) as of 11/10/2015 08:26  Ref. Range 11/09/2015 15:41 11/09/2015 19:21 11/09/2015 23:16 11/10/2015 03:21 11/10/2015 04:46  Glucose-Capillary Latest Ref Range: 65-99 mg/dL 132 (H) 136 (H) 226 (H) 197 (H)    Diabetes history: Type 2 Outpatient Diabetes medications: Glipizide 10mg  bid Current orders for Inpatient glycemic control: none  Inpatient Diabetes Program Recommendations: BG slightly elevated yesterday evening.  A1C 5.5%.  With low A1C and poor dietary intake- this patient likely needs no Glipizide on discharge.   If blood sugars remain elevated, patient may need Novolog sensitive correction 0-9 units tid with meals.   Gentry Fitz, RN, BA, MHA, CDE Diabetes Coordinator Inpatient Diabetes Program  [phone] (Team Pager) [phone] ([place]) 11/10/2015 8:30 AM

## 2015-11-10 NOTE — Evaluation (Addendum)
Physical Therapy Evaluation Patient Details Name: Walter French MRN: [id number] DOB: Oct 28, 1946 Today's Date: 11/10/2015   History of Present Illness  BOE GOFFREDO is a 69 y.o. male with a history of Gastroesophageal Cancer , CAD, CHF with EF= 30-35%. Persistent Atrial Fibrillation on Eliquis Rx, DM2, who presents to the ED with complaints of low blood sugars off and on for the past 2 weeks  Clinical Impression  Pt admitted with above diagnosis. Pt currently with functional limitations due to the deficits listed below (see PT Problem List). Pt becomes erratic and staggering Lt and Rt after becoming SOB while ambulating (SpO2 79% on 4L O2).  Pt has difficulty w/ pursed lip breathing and continues to breathe in/out of his mouth.  SpO2 return to 90's once sitting EOB at end of session.  Discussed w/ pt the importance of having someone with him 24/7 to assist him w/ ambulating as he is currently unstable, pt in agreement and verbalizes understanding. He does not have 24/7 assist available at home and will need to go to [facility] at d/c.  Pt will benefit from skilled PT to increase their independence and safety with mobility to allow discharge to the venue listed below.      Follow Up Recommendations Supervision for mobility/OOB;SNF;Supervision/Assistance - 24 hour    Equipment Recommendations  Rolling walker with 5" wheels    Recommendations for Other Services       Precautions / Restrictions Precautions Precautions: Fall Precaution Comments: monitor O2; Pt impulsive Restrictions Weight Bearing Restrictions: No      Mobility  Bed Mobility Overal bed mobility: Needs Assistance Bed Mobility: Sit to Supine       Sit to supine: Supervision   General bed mobility comments: Supervision for pt's safety and managing lines as pt is impulsive  Transfers Overall transfer level: Needs assistance Equipment used: Standard walker Transfers: Sit to/from Stand Sit to Stand: Min assist          General transfer comment: Min A as pt stands quickly w/ mild instability noted.  Cues for controlled descent to bed after ambulating.  Ambulation/Gait Ambulation/Gait assistance: Min assist Ambulation Distance (Feet): 80 Feet Assistive device: Standard walker;None Gait Pattern/deviations: Step-through pattern;Staggering left;Staggering right;Antalgic;Decreased stride length   Gait velocity interpretation: Below normal speed for age/gender General Gait Details: Initially pt w/ very mild instability using standard walker and then progressed to no AD.  However once pt began to desat to 79% on 4L [place] and became SOB he became erratic and staggering Lt and Rt, requiring min A to stabilize.    Stairs            Wheelchair Mobility    Modified Rankin (Stroke Patients Only)       Balance Overall balance assessment: Needs assistance Sitting-balance support: Feet supported Sitting balance-Leahy Scale: Good     Standing balance support: During functional activity Standing balance-Leahy Scale: Poor Standing balance comment: Min A once SOB to stabilize                             Pertinent Vitals/Pain Pain Assessment: Faces Faces Pain Scale: Hurts a little bit Pain Location: "all over" Pain Descriptors / Indicators: Aching Pain Intervention(s): Limited activity within patient's tolerance;Monitored during session;Repositioned;Utilized relaxation techniques    Home Living Family/patient expects to be discharged to:: Private residence Living Arrangements: Alone Available Help at Discharge: Family;Available PRN/intermittently (daughter does not live in same town) Type of Home:  Mobile home Home Access: Stairs to enter Entrance Stairs-Rails: Right;Left;Can reach both Entrance Stairs-Number of Steps: 5 Home Layout: One level Home Equipment: None      Prior Function Level of Independence: Independent               Hand Dominance        Extremity/Trunk  Assessment   Upper Extremity Assessment: Overall WFL for tasks assessed           Lower Extremity Assessment: Overall WFL for tasks assessed         Communication   Communication: No difficulties  Cognition Arousal/Alertness: Awake/alert Behavior During Therapy: Impulsive;Restless Overall Cognitive Status: Within Functional Limits for tasks assessed                      General Comments General comments (skin integrity, edema, etc.): Pt notes that he feels weaker than usual. Pt does not have 24/7 assist available at d/c and is unsafe to go home alone at this time.  SpO2 in 90's once pt sitting EOB.  Pt has difficulty w/ pursed lip breathing and continues to breathe in/out of his mouth.    Exercises General Exercises - Lower Extremity Ankle Circles/Pumps: AROM;Both;10 reps;Seated Long Arc Quad: AROM;Both;10 reps;Seated Hip Flexion/Marching: AROM;Both;10 reps;Seated      Assessment/Plan    PT Assessment Patient needs continued PT services  PT Diagnosis Difficulty walking;Generalized weakness   PT Problem List Decreased strength;Decreased activity tolerance;Decreased balance;Decreased mobility;Decreased knowledge of use of DME;Decreased safety awareness;Decreased knowledge of precautions;Pain;Cardiopulmonary status limiting activity  PT Treatment Interventions DME instruction;Gait training;Functional mobility training;Stair training;Therapeutic activities;Therapeutic exercise;Balance training;Neuromuscular re-education;Patient/family education   PT Goals (Current goals can be found in the Care Plan section) Acute Rehab PT Goals Patient Stated Goal: to get stronger PT Goal Formulation: With patient Time For Goal Achievement: 11/24/15 Potential to Achieve Goals: Good    Frequency Min 3X/week   Barriers to discharge Inaccessible home environment steps to enter home    Co-evaluation               End of Session Equipment Utilized During Treatment: Gait  belt;Oxygen Activity Tolerance: Patient limited by fatigue Patient left: in bed;with call bell/phone within reach Nurse Communication: Mobility status;Precautions (SpO2 and impulsivity)         Time: [id number] PT Time Calculation (min) (ACUTE ONLY): 17 min   Charges:   PT Evaluation $Initial PT Evaluation Tier I: 1 Procedure     PT G Codes:       Joslyn Hy PT, DPT [phone] Pager: [phone] 11/10/2015, 4:32 PM

## 2015-11-10 NOTE — Progress Notes (Signed)
TELEMETRY: Reviewed telemetry pt in NSR: Filed Vitals:   11/09/15 2315 11/09/15 2337 11/10/15 0318 11/10/15 0802  BP: 115/53  115/57 106/52  Pulse: 78  70 72  Temp:   97.5 F (36.4 C) 97.7 F (36.5 C)  TempSrc:   Oral Oral  Resp: 27  29 23   Height:      Weight:      SpO2: 91% 89% 92% 93%    Intake/Output Summary (Last 24 hours) at 11/10/15 0931 Last data filed at 11/10/15 0802  Gross per 24 hour  Intake    225 ml  Output    950 ml  Net   -725 ml   Filed Weights   11/07/15 2145 11/08/15 0050  Weight: 86.637 kg (191 lb) 88.8 kg (195 lb 12.3 oz)    Subjective Feeling well. Some cough. Breathing better.   [name] allopurinol  300 mg Oral Daily  . amiodarone  400 mg Oral Daily  . antiseptic oral rinse  7 mL Mouth Rinse BID  . apixaban  5 mg Oral BID  . atorvastatin  40 mg Oral Daily  . digoxin  0.125 mg Oral Daily  . famotidine  20 mg Oral Daily  . furosemide  40 mg Oral BID  . levothyroxine  75 mcg Oral QAC breakfast  . metoprolol tartrate  12.5 mg Oral BID  . potassium chloride SA  20 mEq Oral Daily  . saccharomyces boulardii  250 mg Oral BID      LABS: Basic Metabolic Panel:  Recent Labs  11/09/15 0130 11/10/15 0446  NA 128* 132*  K 3.4* 3.4*  CL 93* 93*  CO2 25 29  GLUCOSE 167* 150*  BUN 11 16  CREATININE 1.17 1.17  CALCIUM 8.4* 8.6*  MG  --  2.1   Liver Function Tests:  Recent Labs  11/07/15 2235 11/09/15 0130  AST 54* 45*  ALT 20 19  ALKPHOS 365* 345*  BILITOT 0.9 1.4*  PROT 6.6 6.7  ALBUMIN 2.1* 2.0*   No results for input(s): LIPASE, AMYLASE in the last 72 hours. CBC:  Recent Labs  11/07/15 2235 11/08/15 0406 11/09/15 0130  WBC 7.9 6.3 7.4  NEUTROABS 6.4  --   --   HGB 9.7* 8.1* 9.8*  HCT 29.9* 24.8* 29.7*  MCV 86.9 86.7 85.1  PLT 171 131* 154   Cardiac Enzymes: No results for input(s): CKTOTAL, CKMB, CKMBINDEX, TROPONINI in the last 72 hours. BNP: No results for input(s): PROBNP in the last 72 hours. D-Dimer: No results  for input(s): DDIMER in the last 72 hours. Hemoglobin A1C:  Recent Labs  11/08/15 0406  HGBA1C 5.5   Fasting Lipid Panel: No results for input(s): CHOL, HDL, LDLCALC, TRIG, CHOLHDL, LDLDIRECT in the last 72 hours. Thyroid Function Tests:  Recent Labs  11/09/15 0130  TSH 0.588     Radiology/Studies:  Dg Chest 2 View  11/08/2015  CLINICAL DATA:  Shortness of breath. Pt in afib and desatting during the exam. EXAM: CHEST  2 VIEW COMPARISON:  10/05/2015 FINDINGS: Stable cardiac enlargement and cardiac pacer. Low lung volumes. Severe bilateral interstitial change. Retrocardiac consolidation. IMPRESSION: Findings suggest congestive heart failure with severe interstitial edema Electronically Signed   By: Skipper Cliche M.D.   On: 11/08/2015 14:12   Dg Chest Port 1 View  11/09/2015  CLINICAL DATA:  Congestive heart failure. EXAM: PORTABLE CHEST 1 VIEW COMPARISON:  11/08/2015 at 13:30 FINDINGS: There is partial clearance of vascular and interstitial congestive changes. There continues to  be mild ground-glass opacity in the medial base regions. Probable small left pleural effusion. Grossly intact appearances of the transvenous cardiac lead. IMPRESSION: Slight improvement with partial clearance of congestive changes compared to the earlier study Electronically Signed   By: Andreas Newport M.D.   On: 11/09/2015 01:52    PHYSICAL EXAM Well appearing NAD HEENT: Unremarkable Neck: No JVD, no thyromegally Lymphatics: No adenopathy Back: No CVA tenderness Lungs: Coarse rhonchi HEART: Regular rate rhythm, no murmurs, no rubs, no clicks Abd: Flat, positive bowel sounds, no organomegally, no rebound, no guarding Ext: 2 plus pulses, no edema, no cyanosis, no clubbing Skin: No rashes no nodules Neuro: CN II through XII intact, motor grossly intact  ASSESSMENT AND PLAN: 1. 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 Volume status improved. I/O negative 1.4 liters. Weight up but  is still 10 lbs below weight in CHF clinic on 11/16. Will switch lasix to 40 mg po bid. Continue metoprolol and digoxin. Was on diovan 160 mg daily before. Will resume at 80 mg daily. Titrate as BP allows. Need to ambulate today. See if oxygen sats are OK off O2. ICD in place.  2. Atrial fibrillation with RVR. S/p DCCV yesterday. Maintaining NSR on amiodarone. I think this will help significantly with CHF management. On Eliquis. 3. OSA uses CPAP 4. CAD. S/p CABG. Patent grafts by cath 8/16. 5. Hyperlipidemia on lipitor.  6. Gastric CA. S/p RT and chemo.  7. History of VT remote 8. Hypoglycemia improved.   Would like to see him ambulate today. If VSS and he does well with ambulation he may be a candidate for DC later.    Present on Admission:  . Hypoglycemia . Hypotension . Coronary artery disease-status post CABG . Systolic and diastolic CHF, chronic ([place]) . Hyperlipidemia . Atrial fibrillation ([place]) . Hypothyroidism . Anemia of chronic disease . Stomach cancer ([place])  Signed, Peter [place], [place] 11/10/2015 9:31 AM

## 2015-11-10 NOTE — Progress Notes (Signed)
Report called to [facility]. Patient to be transferred to [place] bed 11.

## 2015-11-10 NOTE — Discharge Summary (Signed)
Physician Discharge Summary  Walter French [id number] DOB: [birth year] DOA: 11/07/2015  PCP: Karlene Einstein, MD  Admit date: 11/07/2015 Discharge date: 11/10/2015  Time spent: 45 minutes  Recommendations for Outpatient Follow-up:  1. Changed lasix to bid 40 mg 2. Cut back glipizide to 5 once daily as admitted with hypoglycemia 3. Needs bmet and cbc 1 week 4. Get daily weights 5. 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 Admission 10/05/15 Afib RVR CHad2Vasc2 score=5 -Failed DCCV and intolerant BB and on Amio and digoxin CAd s/p CABG x3 ICM EF 30-35% H/o VT + [employer] ICD Chr Sys/diast HF EF 30-35% 07/2015 adenoCa [place] 07/2015 s/p XRT and Taxol/carbioplain completing Rx 09/2015  Admitted from ED with a couple weeks of poor PO intake in setting completion therapy for GE-J cancer and was actually d/c off of his insulin but continued to take oral glucotrol 10 bid  Review of chart indicates poor compliance in the past   Hospital Course:    1. Severe hypoglycemia-hold glipizide 10, no insulin on discharge-on d/c have re-styarted low dose daily glipizide 5 mg-OP follow up needed 2. Acute hypoxic resp failure-2/2 to CHF-will d/c home with home O2.   3. Decompensated HF-crackles and rales noted. Overnight desaturations as well. cxr o/n 11/28 showed some mild improvement in cxr and given Diurese lasix 40  bid-->IV tid. Difficult to use larger doses as becomes hypotensive. held BB Metoprolol XL. change back po metoprolol 12.5 from tid to bid 11/28.  Placed on lasix 40 bid on d/c.  Will need continued diuretic.  Lost 1.6 liters this admit.  Needs close OP follow up--discharge weight was 195 but not sudre if this is just fluid weight 4. Afib [id number] score= complicated by hypotension-see above med adjustments. ? S/p  DCCV 11/09/15 and maintaining sinus.  Placed on AMio 400 which should be downtitrated as OP per cardiology. Cont Amiodarone 400 od, digoxin 0.125 od. Cont apixaban 5 bid.  Metoprolol dosing changed to 12.5 bid 5. Hypothyroid-cont levothyroxine 75 mcg daily. TSH needed in 2-3 weeks 6. Throat cancer-wonder if this is affecting his overall swallowing-last weight was 306 in office and has gone down to 295 on admission  7. hld-continue atorvastatin 40 daily 8. Gout-cont allopurinol 300 daily  Procedures: DCCV 11/09/15  Consultations:  Cardiology  Discharge Exam: Filed Vitals:   11/10/15 0802 11/10/15 1215  BP: 106/52   Pulse: 72   Temp: 97.7 F (36.5 C) 98.5 F (36.9 C)  Resp: 23     General: eomi ncat Cardiovascular: s1 s2 no m/r/g  Respiratory: clear no added sound  Discharge Instructions   Discharge Instructions    Diet - low sodium heart healthy    Complete by:  As directed      Discharge instructions    Complete by:  As directed   Do not take high doses of glipizide for blood sugar control-I have  cut back the dose drastically-recheck your blood sugars regularly and follow with your primary MD Continue new medications as per chart Notice that some medicine doses and forms as well as frequencies have changed Check your weight-you did have some heart failure in the hospital and will need to take an extra dose in addition to twice daily lasix if you gain mor ethan 2 lbs in a 24 hr period of time     Increase activity slowly    Complete by:  As directed            Current Discharge Medication List    START taking these medications   Details  metoprolol tartrate (LOPRESSOR) 25 MG tablet Take 0.5 tablets (12.5 mg total) by mouth 2 (two) times daily. Qty: 30 tablet, Refills: 0      CONTINUE these medications which have CHANGED   Details  furosemide (LASIX) 40 MG tablet Take 1 tablet (40 mg total) by mouth 2 (two) times daily. Qty: 60 tablet, Refills: 0    glipiZIDE (GLUCOTROL) 5 MG tablet Take 1 tablet with a good breakfast Qty: 15 tablet, Refills: 0      CONTINUE these medications which have NOT CHANGED   Details  acetaminophen (TYLENOL) 500 MG tablet Take 1,000 mg by mouth every 6 (six) hours as needed for moderate pain or headache.    allopurinol (ZYLOPRIM) 300 MG tablet Take 300 mg by mouth daily.      amiodarone (PACERONE) 400 MG tablet Take 1 tablet (400 mg total) by mouth daily. Qty: 30 tablet, Refills: 5    apixaban (ELIQUIS) 5 MG TABS tablet Take 1 tablet (5 mg total) by mouth 2 (two) times daily. Qty: 60 tablet, Refills: 11    atorvastatin (LIPITOR) 40 MG tablet Take 1 tablet (40 mg total) by mouth daily. Qty: 90 tablet, Refills: 3    digoxin (LANOXIN) 0.125 MG tablet Take 1 tablet (0.125 mg total) by mouth daily. Qty: 30 tablet, Refills: 6    levothyroxine (SYNTHROID, LEVOTHROID) 75 MCG tablet Take 75 mcg by mouth daily before breakfast.  Refills: 0    nitroGLYCERIN (NITROSTAT) 0.4 MG SL tablet Place 1 tablet (0.4 mg total) under the tongue every 5 (five) minutes x 3 doses as needed for chest pain. Qty: 25 tablet, Refills: 3    potassium chloride SA (K-DUR,KLOR-CON) 20 MEQ tablet Take 1 tablet (20 mEq total) by mouth daily. Qty: 30 tablet, Refills: 11    ranitidine (ZANTAC) 300 MG tablet Take 1 tablet (300 mg total) by mouth daily as needed for heartburn. Qty: 30 tablet, Refills: 7      STOP taking these medications     metoprolol succinate (TOPROL-XL) 50 MG 24 hr tablet      saccharomyces boulardii  (FLORASTOR) 250 MG capsule        Allergies  Allergen Reactions  . Adhesive [Tape] Other (See Comments)    Pulls skin off / please use paper tape  . Levaquin [Levofloxacin In D5w] Nausea And Vomiting  . Morphine And Related Nausea And Vomiting   Follow-up Information    Follow up with [facility].   Specialty:  Emergency Medicine   Why:  If symptoms worsen   Contact information:   [address] [id number] [place] [phone]       The results of significant diagnostics from this hospitalization (including imaging, microbiology, ancillary and laboratory) are listed below for reference.    Significant Diagnostic Studies: Dg  Chest 2 View  11/08/2015  CLINICAL DATA:  Shortness of breath. Pt in afib and desatting during the exam. EXAM: CHEST  2 VIEW COMPARISON:  10/05/2015 FINDINGS: Stable cardiac enlargement and cardiac pacer. Low lung volumes. Severe bilateral interstitial change. Retrocardiac consolidation. IMPRESSION: Findings suggest congestive heart failure with severe interstitial edema Electronically Signed   By: Skipper Cliche M.D.   On: 11/08/2015 14:12   Dg Chest Port 1 View  11/09/2015  CLINICAL DATA:  Congestive heart failure. EXAM: PORTABLE CHEST 1 VIEW COMPARISON:  11/08/2015 at 13:30 FINDINGS: There is partial clearance of vascular and interstitial congestive changes. There continues to be mild ground-glass opacity in the medial base regions. Probable small left pleural effusion. Grossly intact appearances of the transvenous cardiac lead. IMPRESSION: Slight improvement with partial clearance of congestive changes compared to the earlier study Electronically Signed   By: Andreas Newport M.D.   On: 11/09/2015 01:52    Microbiology: Recent Results (from the past 240 hour(s))  MRSA PCR Screening     Status: None   Collection Time: 11/08/15  1:11 AM  Result Value Ref Range Status   MRSA by PCR  NEGATIVE NEGATIVE Final    Comment:        The GeneXpert MRSA Assay (FDA approved for NASAL specimens only), is one component of a comprehensive MRSA colonization surveillance program. It is not intended to diagnose MRSA infection nor to guide or monitor treatment for MRSA infections.      Labs: Basic Metabolic Panel:  Recent Labs Lab 11/04/15 1415 11/07/15 2235 11/08/15 0406 11/09/15 0130 11/10/15 0446  NA 130* 132* 130* 128* 132*  K 4.4 4.6 3.8 3.4* 3.4*  CL 98* 99* 98* 93* 93*  CO2 22 25 26 25 29   GLUCOSE 208* 50* 71 167* 150*  BUN 13 11 10 11 16   CREATININE 1.53* 1.21 1.13 1.17 1.17  CALCIUM 9.0 8.8* 8.5* 8.4* 8.6*  MG  --   --   --   --  2.1   Liver Function Tests:  Recent Labs Lab 11/07/15 2235 11/09/15 0130  AST 54* 45*  ALT 20 19  ALKPHOS 365* 345*  BILITOT 0.9 1.4*  PROT 6.6 6.7  ALBUMIN 2.1* 2.0*   No results for input(s): LIPASE, AMYLASE in the last 168 hours. No results for input(s): AMMONIA in the last 168 hours. CBC:  Recent Labs Lab 11/07/15 2235 11/08/15 0406 11/09/15 0130  WBC 7.9 6.3 7.4  NEUTROABS 6.4  --   --   HGB 9.7* 8.1* 9.8*  HCT 29.9* 24.8* 29.7*  MCV 86.9 86.7 85.1  PLT 171 131* 154   Cardiac Enzymes: No results for input(s): CKTOTAL, CKMB, CKMBINDEX, TROPONINI in the last 168 hours. BNP: BNP (last 3 results)  Recent Labs  09/30/15 2156 10/07/15 0258 11/09/15 0130  BNP 704.3* 999.3* 723.3*    ProBNP (last 3 results) No results for input(s): PROBNP in the last 8760 hours.  CBG:  Recent Labs Lab 11/09/15 1541 11/09/15 1921 11/09/15 2316 11/10/15 0321 11/10/15 1229  GLUCAP 132* 136* 226* 197* 184*       Signed:  Nita Sells  [facility] 11/10/2015, 1:40 PM

## 2015-11-10 NOTE — Care Management Note (Signed)
Case Management Note  Patient Details  Name: Walter French MRN: [id number] Date of Birth: [birth year]  Subjective/Objective:         Admitted with low blood sugar, PTA independent with ADL's, lives alone, no DME usage.         Action/Plan: Per PT's evaluation/recommendations:Discussed w/ pt the importance of having someone at home 24/7 to assist him w/ ambulating as he is currently unstable, pt in agreement and verbalizes understanding. Pt will benefit from skilled PT to increase their independence and safety with mobility to allow discharge to the venue listed below. CM placed referral for CSW regarding SNF placement. CM to f/u with disposition needs.  Expected Discharge Date:                  Expected Discharge Plan:     In-House Referral:  Clinical Social Work  Discharge planning Services  CM Consult  Post Acute Care Choice:    Choice offered to:     DME Arranged:    DME Agency:     HH Arranged:    HH Agency:     Status of Service:  In process, will continue to follow  Medicare Important Message Given:   Date Medicare IM Given:    Medicare IM give by:    Date Additional Medicare IM Given:    Additional Medicare Important Message give by:     If discussed at [place] of Stay Meetings, dates discussed:    Additional Comments: S/p successful cardioversion 11/09/15  2/2 a.fib  [name]) HCPOA [phone], [place] [facility])  [phone], Jody(daughter)[phone]   Whitman Hero [place], [place]  [phone] 11/10/2015, 3:06 PM

## 2015-11-10 NOTE — Progress Notes (Signed)
UR COMPLETED  

## 2015-11-11 ENCOUNTER — Encounter (HOSPITAL_COMMUNITY): Payer: Self-pay | Admitting: Student

## 2015-11-11 ENCOUNTER — Inpatient Hospital Stay (HOSPITAL_COMMUNITY): Payer: Medicare Other

## 2015-11-11 DIAGNOSIS — I509 Heart failure, unspecified: Secondary | ICD-10-CM

## 2015-11-11 DIAGNOSIS — I4891 Unspecified atrial fibrillation: Secondary | ICD-10-CM

## 2015-11-11 DIAGNOSIS — C169 Malignant neoplasm of stomach, unspecified: Secondary | ICD-10-CM

## 2015-11-11 DIAGNOSIS — J9601 Acute respiratory failure with hypoxia: Secondary | ICD-10-CM

## 2015-11-11 LAB — GLUCOSE, CAPILLARY
GLUCOSE-CAPILLARY: 126 mg/dL — AB (ref 65–99)
GLUCOSE-CAPILLARY: 148 mg/dL — AB (ref 65–99)
Glucose-Capillary: 124 mg/dL — ABNORMAL HIGH (ref 65–99)
Glucose-Capillary: 262 mg/dL — ABNORMAL HIGH (ref 65–99)
Glucose-Capillary: 97 mg/dL (ref 65–99)
Glucose-Capillary: 170 mg/dL — ABNORMAL HIGH (ref 65–99)

## 2015-11-11 LAB — BASIC METABOLIC PANEL
Anion gap: 8 (ref 5–15)
BUN: 19 mg/dL (ref 6–20)
CALCIUM: 9 mg/dL (ref 8.9–10.3)
CO2: 32 mmol/L (ref 22–32)
CREATININE: 1.26 mg/dL — AB (ref 0.61–1.24)
Chloride: 92 mmol/L — ABNORMAL LOW (ref 101–111)
GFR calc non Af Amer: 57 mL/min — ABNORMAL LOW (ref >60)
Glucose, Bld: 131 mg/dL — ABNORMAL HIGH (ref 65–99)
Potassium: 3.3 mmol/L — ABNORMAL LOW (ref 3.5–5.1)
Sodium: 132 mmol/L — ABNORMAL LOW (ref 135–145)

## 2015-11-11 LAB — CBC
HCT: 30.7 % — ABNORMAL LOW (ref 39.0–52.0)
Hemoglobin: 10 g/dL — ABNORMAL LOW (ref 13.0–17.0)
MCH: 28 pg (ref 26.0–34.0)
MCHC: 32.6 g/dL (ref 30.0–36.0)
MCV: 86 fL (ref 78.0–100.0)
PLATELETS: 168 10*3/uL (ref 150–400)
RBC: 3.57 MIL/uL — AB (ref 4.22–5.81)
RDW: 16.7 % — AB (ref 11.5–15.5)
WBC: 6 10*3/uL (ref 4.0–10.5)

## 2015-11-11 MED ORDER — METOLAZONE 2.5 MG PO TABS
2.5000 mg | ORAL_TABLET | Freq: Every day | ORAL | Status: DC
  Administered 2015-11-11: 2.5 mg via ORAL
  Filled 2015-11-11: qty 1

## 2015-11-11 MED ORDER — FUROSEMIDE 10 MG/ML IJ SOLN
40.0000 mg | Freq: Two times a day (BID) | INTRAMUSCULAR | Status: DC
  Administered 2015-11-11 (×2): 40 mg via INTRAVENOUS
  Filled 2015-11-11 (×3): qty 4

## 2015-11-11 NOTE — Progress Notes (Signed)
Pharmacist Heart Failure Core Measure Documentation  Assessment: Walter French has an EF documented as 30-35% on 8/16 by Echo.  Rationale: Heart failure patients with left ventricular systolic dysfunction (LVSD) and an EF < 40% should be prescribed an angiotensin converting enzyme inhibitor (ACEI) or angiotensin receptor blocker (ARB) at discharge unless a contraindication is documented in the medical record.  This patient is not currently on an ACEI or ARB for HF.  This note is being placed in the record in order to provide documentation that a contraindication to the use of these agents is present for this encounter.  ACE Inhibitor or Angiotensin Receptor Blocker is contraindicated (specify all that apply)  []   ACEI allergy AND ARB allergy []   Angioedema []   Moderate or severe aortic stenosis []   Hyperkalemia [x]   Hypotension []   Renal artery stenosis []   Worsening renal function, preexisting renal disease or dysfunction   Tad Moore 11/11/2015 11:29 AM

## 2015-11-11 NOTE — Progress Notes (Signed)
Patient Name: Walter French Date of Encounter: 11/11/2015  Principal Problem:   Hypoglycemia Active Problems:   Hyperlipidemia   Coronary artery disease-status post CABG   Systolic and diastolic CHF, chronic (HCC)   Atrial fibrillation (HCC)   Anemia of chronic disease   Hypothyroidism   Chronic anticoagulation - Eliquis, CHADS2VASC=5   Hypotension   Stomach cancer [facility])   Primary Cardiologist: Dr. Martinique Patient Profile: 69 y.o. 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 Successful DCCV on 11/09/2015 w/ return to NSR.  SUBJECTIVE: Had noticed in the patient's chart they were looking at possible SNF placement for local rehab. When asked about this, he states "I am not going to a nursing home. I am going home. I will stay here and die before going to a nursing home".  He does report still having shortness of breath and a productive cough. Mild chest pain with inspiration. Denies any palpitations.  OBJECTIVE Filed Vitals:   11/10/15 2335 11/11/15 0017 11/11/15 0127 11/11/15 0520  BP:  112/49  106/41  Pulse:  71  71  Temp:  98.5 F (36.9 C)  97.6 F (36.4 C)  TempSrc:  Oral  Oral  Resp:  24  23  Height:      Weight:    188 lb 4.8 oz (85.412 kg)  SpO2: 100% 93% 100% 98%    Intake/Output Summary (Last 24 hours) at 11/11/15 0759 Last data filed at 11/11/15 0543  Gross per 24 hour  Intake    960 ml  Output   1025 ml  Net    -65 ml   Filed Weights   11/08/15 0050 11/10/15 2137 11/11/15 0520  Weight: 195 lb 12.3 oz (88.8 kg) 189 lb (85.73 kg) 188 lb 4.8 oz (85.412 kg)    PHYSICAL EXAM General: Well developed, well nourished, male in no acute distress. On 3L [place]. Head: Normocephalic, atraumatic.  Neck: Supple without  bruits, JVD not elevated. Lungs:  Resp regular and unlabored, rales throughout lung fields. Heart: RRR, S1, S2, no S3, S4, or murmur; no rub. Abdomen: Soft, non-tender, non-distended with normoactive bowel sounds. No hepatomegaly. No rebound/guarding. No obvious abdominal masses. Extremities: No clubbing, cyanosis, or edema. Distal pedal pulses are 2+ bilaterally. Neuro: Alert and oriented X 3. Moves all extremities spontaneously. Psych: Normal affect.  LABS: CBC: Recent Labs  11/09/15 0130  WBC 7.4  HGB 9.8*  HCT 29.7*  MCV 85.1  PLT 154   INR:No results for input(s): INR in the last 72 hours. Basic Metabolic Panel: Recent Labs  11/09/15 0130 11/10/15 0446  NA 128* 132*  K 3.4* 3.4*  CL 93* 93*  CO2 25 29  GLUCOSE 167* 150*  BUN 11 16  CREATININE 1.17 1.17  CALCIUM 8.4* 8.6*  MG  --  2.1   Liver Function Tests: Recent Labs  11/09/15 0130  AST 45*  ALT 19  ALKPHOS 345*  BILITOT 1.4*  PROT 6.7  ALBUMIN 2.0*   BNP:  B NATRIURETIC PEPTIDE  Date/Time Value Ref Range Status  11/09/2015 01:30 AM 723.3* 0.0 - 100.0 pg/mL Final  10/07/2015 02:58 AM 999.3* 0.0 - 100.0 pg/mL Final  Thyroid Function Tests: Recent Labs  11/09/15 0130  TSH 0.588  TELE:  1st degree AV block with rate in 60's - 70's. No evidence of atrial fibrillation.      Radiology/Studies: Dg Chest 2 View: 11/11/2015  CLINICAL DATA:  Increased shortness of breath over the past 2 days, generalize weakness; history of atrial fibrillation, CHF, previous CABG, and cardiac dysrhythmias. EXAM: CHEST  2 VIEW COMPARISON:  Portable chest x-ray of November 09, 2015 FINDINGS: The lungs are reasonably well inflated. The interstitial markings remain increased bilaterally. There is no significant pleural effusion. The cardiac silhouette remains enlarged. The central pulmonary vascularity remains engorged. A permanent pacemaker defibrillator is present and stable. The patient has undergone previous median  sternotomy. There are multiple broken sternal wires. The bony thorax exhibits no acute abnormality. Cardiac monitoring electrodes overlie the upper abdomen. There is a structure that could reflect a venous catheter or mediastinal drain in the midline though I favor this reflecting a electrode. Correlation clinically in this regard is needed. IMPRESSION: CHF with mild interstitial edema. There is no alveolar pneumonia nor other acute cardiopulmonary abnormality. Electronically Signed   By: David  Martinique M.D.   On: 11/11/2015 07:46     Current Medications:  . allopurinol  300 mg Oral Daily  . amiodarone  400 mg Oral Daily  . antiseptic oral rinse  7 mL Mouth Rinse BID  . apixaban  5 mg Oral BID  . atorvastatin  40 mg Oral Daily  . digoxin  0.125 mg Oral Daily  . famotidine  20 mg Oral Daily  . furosemide  40 mg Oral BID  . levothyroxine  75 mcg Oral QAC breakfast  . metoprolol tartrate  12.5 mg Oral BID  . potassium chloride SA  20 mEq Oral Daily  . saccharomyces boulardii  250 mg Oral BID      ASSESSMENT AND PLAN:  1. Persistent atrial fib with a CVR/RVR - This patients CHA2DS2-VASc Score and unadjusted Ischemic Stroke Rate (% per year) is equal to 7.2 % stroke rate/year from a score of 5 (CHF, CAD, HTN, DM, Age). Continue Eliquis for anticoagulation. - had been compliant with Eliquis and Amiodarone as an outpatient. - underwent successful DCCV on 11/09/2015 with single 120J biphasic shock on 11/09/2015. - maintaining NSR. - continue Amiodarone, Digoxin, and BB.  2. Acute on Chronic Systolic and Diastolic CHF - net negative -1.5L since admission. - switched to Lasix 40mg  PO BID on 11/10/2015  3. 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 ICM - EF 30-35% by echo in 07/2015.  5. Remote VT - s/p ICD Implant 2003  6. CAD - s/p CABG with LIMA to LAD, SVG to PDA, SVG to PL. Patient grafts by cath in 07/2015. - continue statin and BB.  7. Acute Hypoxia - O2  saturation dropping into low-80's with ambulation on room air, with improvement into the 90's on 6L [place] with ambulation. On 3L [place] currently at rest. - will be discharged on home O2. PT recommended skilled PT and SNF placement but patient is adamantly refusing. Says he is ok with having home health.  Arna Medici , PA-C 7:59 AM 11/11/2015 Pager: [phone] Patient seen and examined and history reviewed. Agree with above findings and plan. Walter French appears more SOB today. He has bilateral rales with coarse rhonchi. CXR shows worsening pulmonary edema. Oxygen sats are lower. He is maintaining NSR on amiodarone. I/O are negative this admission. Weight may not be helpful since he has lost significant weight with his cancer dx and  treatment. Agree with resuming lasix IV.  Will also give metolazone 2.5 mg today. Monitor renal indices. If no significant improvement by am will ask heart failure team to see again.  Peter Martinique, [place] 11/11/2015 1:58 PM

## 2015-11-11 NOTE — Progress Notes (Signed)
Pt O2 sat drops to 80s overnight with minimal exertion, requiring increased oxygen level via [place] with movement. Pt with shortness of breath with movement, pt states he feels okay at rest, and his shortness of breath with movement is not any worse than it has been.

## 2015-11-11 NOTE — Progress Notes (Signed)
Patient refuses CPAP for the night  

## 2015-11-11 NOTE — Progress Notes (Signed)
[facility] PROGRESS NOTE  CORNELUIS BOEDEKER [id number] DOB: 02/27/1946 DOA: 11/07/2015 PCP: Karlene Einstein, MD  Brief Summary  69 y/o male admitted 10/05/15 with Afib RVR CHad2Vasc2 score=5, Failed DCCV and intolerant BB and therefore on Amio and digoxin, CAd s/p CABG x3, chronic systolic heart failure with EF 30-35% 07/2015, H/o VT s/p [employer] ICD, adenoCa [place] 07/2015 s/p XRT and Taxol/carbioplain completing Rx 09/2015.  Admitted due to hypoglycemia due to poor PO intake despite being taken off his insulin and started on glucotrol.    Review of chart indicates poor compliance in the past.  Declines SNF.    Assessment/Plan  Acute hypoxic respiratory failure due to acute on chronic systolic heart failure, BUN and creatinine rising slightly but still on >4L O2 with dyspnea and interstitial edema on CXR -  Start lasix 40mg  IV BID -  Daily weights and strict I/O -  Last ECHO just a month ago and therefore do not need to repeat -  F/u creatinine in AM -  Continue to wean O2 as tolerated  Atrial fibrillation s/p successful DCCV on 11/09/2015, now on amiodarone which should be tapered as outpatient per cardiology and digoxin -  Tele:  NSR -  CHad2Vasc2 score= 5 -  Continue apixaban  Severe hypoglycemia due to malignancy associated weight loss  -  A1c -  Continue supplements  -  Continue to hold hypoglycemic agents  Hypothyroidism, stable, continue levothyroxine 52mcg daily and repeat TSH in 2-3 weeks  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  Diet:  Low sodium  Access:  PIV IVF:  off Proph:  apixaban  Code Status: full Family Communication: patient alone Disposition Plan: declines SNF  and wants [place] services with family care   Consultants:  CArdiology  Diabetic educator  Procedures:  DCCV on 11/28  Antibiotics:  none   HPI/Subjective:  Still SOB which he states has been present since he was a child.  Denies worsening but not improved.  Unclear if he voided more over last 24 hours since starting lasix.      Objective: Filed Vitals:   11/10/15 2335 11/11/15 0017 11/11/15 0127 11/11/15 0520  BP:  112/49  106/41  Pulse:  71  71  Temp:  98.5 F (36.9 C)  97.6 F (36.4 C)  TempSrc:  Oral  Oral  Resp:  24  23  Height:      Weight:    85.412 kg (188 lb 4.8 oz)  SpO2: 100% 93% 100% 98%    Intake/Output Summary (Last 24 hours) at 11/11/15 1023 Last data filed at 11/11/15 0930  Gross per 24 hour  Intake    960 ml  Output    575 ml  Net    385 ml   Filed Weights   11/08/15 0050 11/10/15 2137 11/11/15 0520  Weight: 88.8 kg (195 lb 12.3 oz) 85.73 kg (189 lb) 85.412 kg (188 lb 4.8 oz)   Body mass index is 25.53 kg/(m^2).  Exam:   General:  Adult male, gasping after starting to answer questions this morning  HEENT:  NCAT, MMM  Cardiovascular:  RRR, nl S1, S2 no mrg, 2+ pulses, warm extremities  Respiratory:  Rales at the right base, no wheezes or rhonchi  Abdomen:  NABS, soft, NT/ND  MSK:   Normal tone and bulk, no LEE  Neuro:  Diffusely weak  Data Reviewed: Basic Metabolic Panel:  Recent Labs Lab 11/07/15 2235 11/08/15 0406 11/09/15 0130 11/10/15 0446 11/11/15 0824  NA 132* 130* 128* 132* 132*  K 4.6 3.8 3.4* 3.4* 3.3*  CL 99* 98* 93* 93* 92*  CO2 25 26 25 29  32  GLUCOSE 50* 71 167* 150* 131*  BUN 11 10 11 16 19   CREATININE 1.21 1.13 1.17 1.17 1.26*  CALCIUM 8.8* 8.5* 8.4* 8.6* 9.0  MG  --   --   --  2.1  --    Liver Function Tests:  Recent Labs Lab 11/07/15 2235 11/09/15 0130  AST 54* 45*  ALT 20 19  ALKPHOS 365* 345*  BILITOT 0.9 1.4*  PROT 6.6 6.7  ALBUMIN 2.1* 2.0*   No results for input(s): LIPASE, AMYLASE in  the last 168 hours. No results for input(s): AMMONIA in the last 168 hours. CBC:  Recent Labs Lab 11/07/15 2235 11/08/15 0406 11/09/15 0130 11/11/15 0824  WBC 7.9 6.3 7.4 6.0  NEUTROABS 6.4  --   --   --   HGB 9.7* 8.1* 9.8* 10.0*  HCT 29.9* 24.8* 29.7* 30.7*  MCV 86.9 86.7 85.1 86.0  PLT 171 131* 154 168    Recent Results (from the past 240 hour(s))  MRSA PCR Screening     Status: None   Collection Time: 11/08/15  1:11 AM  Result Value Ref Range Status   MRSA by PCR NEGATIVE NEGATIVE Final    Comment:        The GeneXpert MRSA Assay (FDA approved for NASAL specimens only), is one component of a comprehensive MRSA colonization surveillance program. It is not intended to diagnose MRSA infection nor to guide or monitor treatment for MRSA infections.      Studies: Dg Chest 2 View  11/11/2015  CLINICAL DATA:  Increased shortness of breath over the past 2 days, generalize weakness; history of atrial fibrillation, CHF, previous CABG, and cardiac dysrhythmias. EXAM: CHEST  2 VIEW COMPARISON:  Portable chest x-ray of November 09, 2015 FINDINGS: The lungs are reasonably well inflated. The interstitial markings remain increased bilaterally. There is no significant pleural effusion. The cardiac silhouette remains enlarged. The central pulmonary vascularity remains engorged. A permanent pacemaker defibrillator is present and stable. The patient has undergone previous median sternotomy. There are multiple broken sternal wires. The bony thorax exhibits no acute abnormality. Cardiac monitoring electrodes overlie the upper abdomen. There is a structure that could reflect a venous catheter or mediastinal drain in the midline though I favor this reflecting a electrode. Correlation clinically in this regard is needed. IMPRESSION: CHF with mild interstitial edema. There is no alveolar pneumonia nor other acute cardiopulmonary abnormality. Electronically Signed   By: David  Martinique M.D.   On:  11/11/2015 07:46    Scheduled Meds: . allopurinol  300 mg Oral Daily  . amiodarone  400 mg Oral Daily  . antiseptic oral rinse  7 mL Mouth Rinse BID  . apixaban  5 mg Oral BID  . atorvastatin  40 mg Oral Daily  . digoxin  0.125 mg Oral Daily  . famotidine  20 mg Oral Daily  . furosemide  40 mg Intravenous BID  . levothyroxine  75 mcg Oral QAC breakfast  . metoprolol tartrate  12.5 mg Oral BID  . potassium chloride SA  20 mEq Oral Daily  . saccharomyces boulardii  250 mg Oral BID  Continuous Infusions:   Principal Problem:   Hypoglycemia Active Problems:   Hyperlipidemia   Coronary artery disease-status post CABG   Systolic and diastolic CHF, chronic (HCC)   Atrial fibrillation (HCC)   Anemia of chronic disease   Hypothyroidism   Chronic anticoagulation - Eliquis, CHADS2VASC=5   Hypotension   Stomach cancer ([place])    Time spent: 30 min    Abhiraj Dozal, [place] [facility] Pager [phone]. If 7PM-7AM, please contact night-coverage at [email or web address], password [facility] 11/11/2015, 10:23 AM  LOS: 4 days

## 2015-11-11 NOTE — Progress Notes (Signed)
Physical Therapy Treatment Patient Details Name: Walter French MRN: [id number] DOB: 03/25/1946 Today's Date: 11/11/2015    History of Present Illness Walter French is a 69 y.o. male with a history of Gastroesophageal Cancer , CAD, CHF with EF= 30-35%. Persistent Atrial Fibrillation on Eliquis Rx, DM2, who presents to the ED with complaints of low blood sugars off and on for the past 2 weeks    PT Comments    Pt was seen for evaluation of his mobility and tolerance for activity.  Has poor O2 sats via ck of fingertips but on earlobe is better with sats in acceptable range sitting bedside.  Declined to walk today and noted he is fairly SOB with any effort.  New imaging results on chart, will recommend HHPT to follow up with pt as he is still recovering mobility.    Follow Up Recommendations  Home health PT;Supervision/Assistance - 24 hour     Equipment Recommendations  Rolling walker with 5" wheels    Recommendations for Other Services       Precautions / Restrictions Precautions Precautions: Fall Precaution Comments: monitor O2; Pt impulsive Restrictions Weight Bearing Restrictions: No    Mobility  Bed Mobility Overal bed mobility: Needs Assistance Bed Mobility: Supine to Sit;Sit to Supine     Supine to sit: Min guard Sit to supine: Supervision   General bed mobility comments: to assist lines  Transfers Overall transfer level: Needs assistance Equipment used: Rolling walker (2 wheeled);1 person hand held assist Transfers: Sit to/from Stand Sit to Stand: Min guard;Min assist         General transfer comment: brief standign as pt does not feel well  Ambulation/Gait             General Gait Details: declined   Financial trader Rankin (Stroke Patients Only)       Balance Overall balance assessment: Needs assistance Sitting-balance support: Feet supported Sitting balance-Leahy Scale: Good                              Cognition Arousal/Alertness: Awake/alert Behavior During Therapy: Flat affect Overall Cognitive Status: Within Functional Limits for tasks assessed                      Exercises General Exercises - Lower Extremity Ankle Circles/Pumps: AROM;Both;5 reps Long Arc Quad: AROM;Both;10 reps Heel Slides: AROM;Both;10 reps Hip ABduction/ADduction: AROM;Both;10 reps Hip Flexion/Marching: AROM;Both;10 reps    General Comments General comments (skin integrity, edema, etc.): Has plan to get inpt therapy as he is very weak and unable to walk      Pertinent Vitals/Pain Pain Assessment: No/denies pain    Home Living                      Prior Function            PT Goals (current goals can now be found in the care plan section) Acute Rehab PT Goals Patient Stated Goal: feel better Progress towards PT goals: Progressing toward goals (slowly)    Frequency  Min 3X/week    PT Plan Discharge plan needs to be updated    Co-evaluation             End of Session Equipment Utilized During Treatment: Oxygen Activity Tolerance: Patient limited by fatigue;Patient limited by lethargy Patient  left: in bed;with call bell/phone within reach     Time: 1218-1245 PT Time Calculation (min) (ACUTE ONLY): 27 min  Charges:  $Therapeutic Exercise: 8-22 mins $Therapeutic Activity: 8-22 mins                    G Codes:      Ramond Dial 2015/12/08, 1:13 PM   Mee Hives, PT MS Acute Rehab Dept. Number: [phone] [id number] and [place] [phone]

## 2015-11-11 NOTE — Progress Notes (Signed)
Pt arrived to floor in NAD, on 3Lnc, pt O2 sat dropping to low 80s at rest. Pt O2 increased to 6Lnc with O2 sat in 90s. Pt oriented to room and floor, bed alarm in place.

## 2015-11-12 ENCOUNTER — Inpatient Hospital Stay (HOSPITAL_COMMUNITY): Payer: Medicare Other

## 2015-11-12 DIAGNOSIS — E440 Moderate protein-calorie malnutrition: Secondary | ICD-10-CM | POA: Diagnosis present

## 2015-11-12 DIAGNOSIS — I480 Paroxysmal atrial fibrillation: Secondary | ICD-10-CM

## 2015-11-12 DIAGNOSIS — I255 Ischemic cardiomyopathy: Secondary | ICD-10-CM | POA: Diagnosis present

## 2015-11-12 DIAGNOSIS — K5900 Constipation, unspecified: Secondary | ICD-10-CM | POA: Diagnosis present

## 2015-11-12 LAB — CBC
HEMATOCRIT: 32.3 % — AB (ref 39.0–52.0)
Hemoglobin: 10.5 g/dL — ABNORMAL LOW (ref 13.0–17.0)
MCH: 27.8 pg (ref 26.0–34.0)
MCHC: 32.5 g/dL (ref 30.0–36.0)
MCV: 85.4 fL (ref 78.0–100.0)
PLATELETS: 176 10*3/uL (ref 150–400)
RBC: 3.78 MIL/uL — AB (ref 4.22–5.81)
RDW: 16.6 % — AB (ref 11.5–15.5)
WBC: 6.5 10*3/uL (ref 4.0–10.5)

## 2015-11-12 LAB — GLUCOSE, CAPILLARY
GLUCOSE-CAPILLARY: 144 mg/dL — AB (ref 65–99)
GLUCOSE-CAPILLARY: 176 mg/dL — AB (ref 65–99)
Glucose-Capillary: 142 mg/dL — ABNORMAL HIGH (ref 65–99)
Glucose-Capillary: 183 mg/dL — ABNORMAL HIGH (ref 65–99)

## 2015-11-12 LAB — HEPATIC FUNCTION PANEL
ALK PHOS: 427 U/L — AB (ref 38–126)
ALT: 21 U/L (ref 17–63)
AST: 49 U/L — AB (ref 15–41)
Albumin: 2.1 g/dL — ABNORMAL LOW (ref 3.5–5.0)
BILIRUBIN DIRECT: 0.2 mg/dL (ref 0.1–0.5)
BILIRUBIN INDIRECT: 0.9 mg/dL (ref 0.3–0.9)
BILIRUBIN TOTAL: 1.1 mg/dL (ref 0.3–1.2)
Total Protein: 7.3 g/dL (ref 6.5–8.1)

## 2015-11-12 LAB — URINALYSIS, ROUTINE W REFLEX MICROSCOPIC
BILIRUBIN URINE: NEGATIVE
GLUCOSE, UA: NEGATIVE mg/dL
HGB URINE DIPSTICK: NEGATIVE
KETONES UR: NEGATIVE mg/dL
LEUKOCYTES UA: NEGATIVE
Nitrite: NEGATIVE
PH: 6 (ref 5.0–8.0)
PROTEIN: NEGATIVE mg/dL
Specific Gravity, Urine: 1.009 (ref 1.005–1.030)

## 2015-11-12 LAB — BASIC METABOLIC PANEL
Anion gap: 10 (ref 5–15)
BUN: 22 mg/dL — AB (ref 6–20)
CO2: 33 mmol/L — ABNORMAL HIGH (ref 22–32)
Calcium: 8.9 mg/dL (ref 8.9–10.3)
Chloride: 88 mmol/L — ABNORMAL LOW (ref 101–111)
Creatinine, Ser: 1.25 mg/dL — ABNORMAL HIGH (ref 0.61–1.24)
GFR, EST NON AFRICAN AMERICAN: 58 mL/min — AB (ref >60)
Glucose, Bld: 138 mg/dL — ABNORMAL HIGH (ref 65–99)
POTASSIUM: 2.9 mmol/L — AB (ref 3.5–5.1)
SODIUM: 131 mmol/L — AB (ref 135–145)

## 2015-11-12 LAB — MAGNESIUM: Magnesium: 2 mg/dL (ref 1.7–2.4)

## 2015-11-12 LAB — LIPASE, BLOOD: LIPASE: 47 U/L (ref 11–51)

## 2015-11-12 LAB — PHOSPHORUS: Phosphorus: 4.3 mg/dL (ref 2.5–4.6)

## 2015-11-12 MED ORDER — POTASSIUM CHLORIDE CRYS ER 20 MEQ PO TBCR
40.0000 meq | EXTENDED_RELEASE_TABLET | ORAL | Status: AC
  Administered 2015-11-12 (×3): 40 meq via ORAL
  Filled 2015-11-12 (×3): qty 2

## 2015-11-12 MED ORDER — ENSURE ENLIVE PO LIQD
237.0000 mL | Freq: Two times a day (BID) | ORAL | Status: DC
  Administered 2015-11-12 – 2015-11-16 (×8): 237 mL via ORAL

## 2015-11-12 MED ORDER — POTASSIUM CHLORIDE CRYS ER 20 MEQ PO TBCR
40.0000 meq | EXTENDED_RELEASE_TABLET | Freq: Three times a day (TID) | ORAL | Status: DC
Start: 2015-11-12 — End: 2015-11-12

## 2015-11-12 MED ORDER — SENNA 8.6 MG PO TABS
2.0000 | ORAL_TABLET | Freq: Every day | ORAL | Status: DC
  Administered 2015-11-12 – 2015-11-15 (×4): 17.2 mg via ORAL
  Filled 2015-11-12 (×4): qty 2

## 2015-11-12 MED ORDER — POLYETHYLENE GLYCOL 3350 17 G PO PACK
17.0000 g | PACK | Freq: Two times a day (BID) | ORAL | Status: DC
  Administered 2015-11-12 – 2015-11-15 (×7): 17 g via ORAL
  Filled 2015-11-12 (×8): qty 1

## 2015-11-12 MED ORDER — FUROSEMIDE 10 MG/ML IJ SOLN
40.0000 mg | Freq: Two times a day (BID) | INTRAMUSCULAR | Status: DC
  Administered 2015-11-12 – 2015-11-14 (×5): 40 mg via INTRAVENOUS
  Filled 2015-11-12 (×5): qty 4

## 2015-11-12 MED ORDER — DOCUSATE SODIUM 100 MG PO CAPS
100.0000 mg | ORAL_CAPSULE | Freq: Two times a day (BID) | ORAL | Status: DC
  Administered 2015-11-12 – 2015-11-15 (×7): 100 mg via ORAL
  Filled 2015-11-12 (×8): qty 1

## 2015-11-12 MED ORDER — BISACODYL 10 MG RE SUPP: 10.0000 mg | Freq: Once | RECTAL | Status: DC

## 2015-11-12 NOTE — Progress Notes (Signed)
Pt says he used to wear CPAP but does not wear any longer. He is refusing to wear tonight. Pt understands to call if he changes his mind.

## 2015-11-12 NOTE — Progress Notes (Signed)
Nutrition Follow-up  DOCUMENTATION CODES:   Non-severe (moderate) malnutrition in context of chronic illness  INTERVENTION:  Provide Ensure Enlive po BID, each supplement provides 350 kcal and 20 grams of protein Encourage PO intake   NUTRITION DIAGNOSIS:   Unintentional weight loss related to cancer and cancer related treatments, catabolic illness as evidenced by percent weight loss.  Ongoing  GOAL:   Patient will meet greater than or equal to 90% of their needs  Unmet  MONITOR:   Diet advancement, PO intake, Labs, Weight trends, I & O's  REASON FOR ASSESSMENT:   Malnutrition Screening Tool    ASSESSMENT:   69 y.o. male with a history of Gastroesophageal Cancer , CAD, CHF with EF= 30-35%. Persistent Atrial Fibrillation on Eliquis Rx, DM2, who presents to the ED with complaints of low blood sugars off and on for the past 2 weeks. He reports a poor appetite for the past 6 weeks following his last chemotherapy. He reports that he was taken off his Insulin therapy by his PCP, and he remained on his oral Diabetic Rx but he has still had low blood sugars. Night of admission his glucose level was 38, and he was given IV Dextrose several times in the ED and his glucose continued to drop back down so he was placed on IV D10 drip and referred for admission.  Pt reports that his appetite is good and he is eating well. Per nursing notes, pt has been eating 100% of meals. Per weight history pt has lost 20% of his body weight in the past 5 weeks. Pt relates weight loss to the cancer. He reports eating well PTA. Pt has mild fat wasting and moderate muscle wasting per physical exam. RD encouraged increasing calorie and protein intake through food/snacks and nutritional supplements. Pt is agreeable to receiving Ensure Enlive.   Labs: low sodium, low potassium, low chloride, low hemglobin  Diet Order:  Diet Heart Room service appropriate?: Yes; Fluid consistency:: Thin Diet - low  sodium heart healthy  Skin:  Reviewed, no issues  Last BM:  11/29  Height:   Ht Readings from Last 1 Encounters:  11/10/15 6' (1.829 m)    Weight:   Wt Readings from Last 1 Encounters:  11/12/15 182 lb 1.6 oz (82.6 kg)    Ideal Body Weight:  80.9 kg  BMI:  Body mass index is 24.69 kg/(m^2).  Estimated Nutritional Needs:   Kcal:  2200-2400  Protein:  110-130 gm  Fluid:  2.2-2.4 L  EDUCATION NEEDS:   No education needs identified at this time  [place], LDN Inpatient Clinical Dietitian Pager: [phone] After Hours Pager: [phone]

## 2015-11-12 NOTE — Progress Notes (Signed)
Subjective:  Still SOB, on O2  Objective:  Vital Signs in the last 24 hours: Temp:  [97.8 F (36.6 C)-98.4 F (36.9 C)] 97.8 F (36.6 C) (12/01 0556) Pulse Rate:  [79-93] 83 (12/01 0556) Resp:  [20-21] 21 (12/01 0556) BP: (100-104)/(44-54) 100/54 mmHg (12/01 0556) SpO2:  [90 %-93 %] 93 % (12/01 0812) Weight:  [182 lb 1.6 oz (82.6 kg)] 182 lb 1.6 oz (82.6 kg) (12/01 0556)  Intake/Output from previous day:  Intake/Output Summary (Last 24 hours) at 11/12/15 0836 Last data filed at 11/12/15 [id number]  Gross per 24 hour  Intake   1060 ml  Output   2450 ml  Net  -1390 ml    Physical Exam: General appearance: alert, cooperative, no distress and mild SOB at rest, on O2, exopthalmus noted Neck: no JVD Lungs: fairly clear on Lt, fine crackles 1/3 up on Rt Heart: regular rate and rhythm Abdomen: soft, non-tender; bowel sounds normal; no masses,  no organomegaly Extremities: no edema   Rate: 78  Rhythm: normal sinus rhythm  Lab Results:  Recent Labs  11/11/15 0824 11/12/15 0344  WBC 6.0 6.5  HGB 10.0* 10.5*  PLT 168 176    Recent Labs  11/11/15 0824 11/12/15 0344  NA 132* 131*  K 3.3* 2.9*  CL 92* 88*  CO2 32 33*  GLUCOSE 131* 138*  BUN 19 22*  CREATININE 1.26* 1.25*   No results for input(s): TROPONINI in the last 72 hours.  Invalid input(s): CK, MB No results for input(s): INR in the last 72 hours.  Scheduled Meds: . allopurinol  300 mg Oral Daily  . amiodarone  400 mg Oral Daily  . antiseptic oral rinse  7 mL Mouth Rinse BID  . apixaban  5 mg Oral BID  . atorvastatin  40 mg Oral Daily  . digoxin  0.125 mg Oral Daily  . famotidine  20 mg Oral Daily  . levothyroxine  75 mcg Oral QAC breakfast  . metolazone  2.5 mg Oral Daily  . metoprolol tartrate  12.5 mg Oral BID  . potassium chloride SA  40 mEq Oral 6 times per day  . saccharomyces boulardii  250 mg Oral BID   Continuous Infusions:  PRN Meds:.acetaminophen **OR** acetaminophen, alum & mag  hydroxide-simeth, dextrose, HYDROmorphone (DILAUDID) injection, nitroGLYCERIN, ondansetron **OR** ondansetron (ZOFRAN) IV, oxyCODONE   Imaging: Dg Chest 2 View  11/11/2015  CLINICAL DATA:  Increased shortness of breath over the past 2 days, generalize weakness; history of atrial fibrillation, CHF, previous CABG, and cardiac dysrhythmias. EXAM: CHEST  2 VIEW COMPARISON:  Portable chest x-ray of November 09, 2015 FINDINGS: The lungs are reasonably well inflated. The interstitial markings remain increased bilaterally. There is no significant pleural effusion. The cardiac silhouette remains enlarged. The central pulmonary vascularity remains engorged. A permanent pacemaker defibrillator is present and stable. The patient has undergone previous median sternotomy. There are multiple broken sternal wires. The bony thorax exhibits no acute abnormality. Cardiac monitoring electrodes overlie the upper abdomen. There is a structure that could reflect a venous catheter or mediastinal drain in the midline though I favor this reflecting a electrode. Correlation clinically in this regard is needed. IMPRESSION: CHF with mild interstitial edema. There is no alveolar pneumonia nor other acute cardiopulmonary abnormality. Electronically Signed   By: David  French M.D.   On: 11/11/2015 07:46    Cardiac Studies: Echo 07/23/15 Study Conclusions  - Left ventricle: The cavity size was normal. Wall thickness was increased in a  pattern of moderate LVH. There was focal basal hypertrophy. Systolic function was moderately to severely reduced. The estimated ejection fraction was in the range of 30% to 35%. Features are consistent with a pseudonormal left ventricular filling pattern, with concomitant abnormal relaxation and increased filling pressure (grade 2 diastolic dysfunction). - Pulmonary arteries: Systolic pressure was mildly increased. PA peak pressure: 34 mm Hg (S).  Assessment/Plan:   Principal  Problem:   Acute respiratory failure with hypoxia (HCC) Active Problems:   Systolic and diastolic CHF, chronic (HCC)   PAF- s/p DCCV this admission   Acute on chronic combined systolic (congestive) and diastolic (congestive) heart failure ([place])    CABG '97 and '07. Cath Aug 2016   Automatic implantable cardioverter-defibrillator in situ   Gastroesophageal cancer (HCC)   Hypotension   Cardiomyopathy, ischemic-30-35%   Hyperlipidemia   Anemia of chronic disease   Hypothyroidism   Chronic anticoagulation - Eliquis, CHADS2VASC=5   PLAN: Continue IV diuretics.   Walter Ransom PA-C 11/12/2015, 8:36 AM [phone] Patient seen and examined and history reviewed. Agree with above findings and plan. Complains of feeling weak. Still some SOB. Didn't sleep well. Has abdominal pain around navel. He had a good response to diuretics with negative 1.7 liters. Weight down 6 lbs. Lungs are clearer today. Potassium has dropped to 2.9 with diuresis. Will hold metolazone and continue IV lasix. Repeat CXR in am. Replete potassium. He is maintaining NSR well on amiodarone. Continue Eliquis.  Walter French, [place] 11/12/2015 8:58 AM

## 2015-11-12 NOTE — Progress Notes (Signed)
[facility] PROGRESS NOTE  JACKSYN BEEKS ULA:[id number] DOB: Dec 12, 1946 DOA: 11/07/2015 PCP: Karlene Einstein, MD  Brief Summary  69 y/o male admitted 10/05/15 with Afib RVR CHad2Vasc2 score=5, Failed DCCV and intolerant BB and therefore on Amio and digoxin, CAd s/p CABG x3, chronic systolic heart failure with EF 30-35% 07/2015, H/o VT s/p [employer] ICD, adenoCa [place] 07/2015 s/p XRT and Taxol/carbioplain completing Rx 09/2015.  Admitted due to hypoglycemia due to poor PO intake despite being taken off his insulin and started on glucotrol.    Review of chart indicates poor compliance in the past.  Declines SNF.    Assessment/Plan  Acute hypoxic respiratory failure due to acute on chronic systolic heart failure, BUN and creatinine rising slightly but still on >4L O2 with dyspnea and interstitial edema on CXR -  Continue lasix 7m IV BID -  Received one dose of metolazone on 11/30 -  Negative 1.7 L -  Weight down 3 kg -  Last ECHO just a month ago and therefore do not need to repeat -  Creatinine stable -  Continue to wean O2 as tolerated  Atrial fibrillation s/p successful DCCV on 11/09/2015, now on amiodarone which should be tapered as outpatient per cardiology and digoxin -  Tele:  NSR -  CHad2Vasc2 score= 5 -  Continue apixaban  Abdominal pain likely secondary to constipation -  LFTs, lipase:  notable for elevated alk phos -  UA: neg -  KUB: c/w constipation -  Start bisacodyl supp, senna, colace, and scheduled miralax  Hypoglycemia due to malignancy associated weight loss  -  A1c pending -  Continue supplements  -  Continue to hold hypoglycemic agents  Hypothyroidism, stable, continue levothyroxine 784m daily and repeat TSH in 2-3 weeks  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  Diet:  Low sodium  Access:  PIV IVF:  off Proph:  apixaban  Code Status: full Family Communication: patient alone Disposition Plan: declines SNF and wants HH sIXLvices with family care   Consultants:  CArdiology  Diabetic educator  Procedures:  DCCV on 11/28  Antibiotics:  none   HPI/Subjective:  Still SOB.  Developed some mid-epigastric and periumbilical abdominal pain.  Has not had BM since admission.    Objective: Filed Vitals:   11/12/15 0812 11/12/15 1223 11/12/15 1225 11/12/15 1600  BP:  107/63 103/55 118/63  Pulse:  74 68 84  Temp:   97.7 F (36.5 C)   TempSrc:   Oral   Resp:  _0 Height:      Weight:      SpO2: 93% 95% 100% 92%    Intake/Output Summary (Last 24 hours) at 11/12/15 1756 Last data filed at 11/12/15 1740  Gross per 24 hour  Intake   1180 ml  Output   2580 ml  Net  -1400 ml   Filed Weights   11/10/15 2137 11/11/15 0520 11/12/15 0556  Weight: 85.73 kg (189 lb) 85.412 kg (188 lb 4.8 oz) 82.6 kg (182 lb 1.6 oz)   Body mass index is 24.69 kg/(m^2).  Exam:   General:  Adult male, still winded resting in bed  HEENT:  NCAT, MMM  Cardiovascular:  RRR, nl S1, S2 no mrg, 2+ pulses, warm extremities  Respiratory:  Rales at the right base, no wheezes or rhonchi  Abdomen:   NABS, soft, NT/ND  MSK:   Normal tone and bulk, no LEE  Neuro:  Diffusely weak  Data Reviewed: Basic Metabolic Panel:  Recent Labs Lab 11/08/15 0406 11/09/15 0130 11/10/15 0446 11/11/15 0824 11/12/15 0344  NA 130* 128* 132* 132* 131*  K 3.8 3.4* 3.4* 3.3* 2.9*  CL 98* 93* 93* 92* 88*  CO2 26 25 29 32 33*  GLUCOSE 71 167* 150* 131* 138*  BUN 10 11 16 19 22*  CREATININE 1.13 1.17 1.17 1.26* 1.25*  CALCIUM 8.5* 8.4* 8.6* 9.0 8.9  MG  --   --  2.1  --  2.0  PHOS  --   --   --   --  4.3   Liver Function Tests:  Recent Labs Lab  11/07/15 2235 11/09/15 0130 11/12/15 0344  AST 54* 45* 49*  ALT 20 19 21  ALKPHOS 365* 345* 427*  BILITOT 0.9 1.4* 1.1  PROT 6.6 6.7 7.3  ALBUMIN 2.1* 2.0* 2.1*    Recent Labs Lab 11/12/15 0344  LIPASE 47   No results for input(s): AMMONIA in the last 168 hours. CBC:  Recent Labs Lab 11/07/15 2235 11/08/15 0406 11/09/15 0130 11/11/15 0824 11/12/15 0344  WBC 7.9 6.3 7.4 6.0 6.5  NEUTROABS 6.4  --   --   --   --   HGB 9.7* 8.1* 9.8* 10.0* 10.5*  HCT 29.9* 24.8* 29.7* 30.7* 32.3*  MCV 86.9 86.7 85.1 86.0 85.4  PLT 171 131* 154 168 176    Recent Results (from the past 240 hour(s))  MRSA PCR Screening     Status: None   Collection Time: 11/08/15  1:11 AM  Result Value Ref Range Status   MRSA by PCR NEGATIVE NEGATIVE Final    Comment:        The GeneXpert MRSA Assay (FDA approved for NASAL specimens only), is one component of a comprehensive MRSA colonization surveillance program. It is not intended to diagnose MRSA infection nor to guide or monitor treatment for MRSA infections.      Studies: Dg Chest 2 View  11/11/2015  CLINICAL DATA:  Increased shortness of breath over the past 2 days, generalize weakness; history of atrial fibrillation, CHF, previous CABG, and cardiac dysrhythmias. EXAM: CHEST  2 VIEW COMPARISON:  Portable chest x-ray of November 09, 2015 FINDINGS: The lungs are reasonably well inflated. The interstitial markings remain increased bilaterally. There is no significant pleural effusion. The cardiac silhouette remains enlarged. The central pulmonary vascularity remains engorged. A permanent pacemaker defibrillator is present and stable. The patient has undergone previous median sternotomy. There are multiple broken sternal wires. The bony thorax exhibits no acute abnormality. Cardiac monitoring electrodes overlie the upper abdomen. There is a structure that could reflect a venous catheter or mediastinal drain in the midline though I favor this  reflecting a electrode. Correlation clinically in this regard is needed. IMPRESSION: CHF with mild interstitial edema. There is no alveolar pneumonia nor other acute cardiopulmonary abnormality. Electronically Signed   By: David  Jordan M.D.   On: 11/11/2015 07:46   Dg Abd Portable 1v  11/12/2015  CLINICAL DATA:  Patient with mid abdominal pain. EXAM: PORTABLE ABDOMEN - 1 VIEW COMPARISON:  PET-CT 08/07/2015; CT abdomen 07/23/2015. FINDINGS: Stool is present throughout the colon. Gas is demonstrated within nondilated loops of large and small bowel in a nonobstructed pattern. Regional skeleton unremarkable. IMPRESSION: No bowel obstruction. Stool within the   rectum and ascending colon. Electronically Signed   By: Drew  Davis M.D.   On: 11/12/2015 11:01    Scheduled Meds: . allopurinol  300 mg Oral Daily  . amiodarone  400 mg Oral Daily  . antiseptic oral rinse  7 mL Mouth Rinse BID  . apixaban  5 mg Oral BID  . atorvastatin  40 mg Oral Daily  . bisacodyl  10 mg Rectal Once  . digoxin  0.125 mg Oral Daily  . docusate sodium  100 mg Oral BID  . famotidine  20 mg Oral Daily  . feeding supplement (ENSURE ENLIVE)  237 mL Oral BID BM  . furosemide  40 mg Intravenous BID  . levothyroxine  75 mcg Oral QAC breakfast  . metoprolol tartrate  12.5 mg Oral BID  . polyethylene glycol  17 g Oral BID  . saccharomyces boulardii  250 mg Oral BID  . 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 If 7PM-7AM, please contact night-coverage at [email or web address], password [email or web address] 11/12/2015, 5:56 PM  LOS: 5 days              

## 2015-11-12 NOTE — Progress Notes (Signed)
[facility]  Patient Status: Active (receiving services up to time of hospitalization)  [facility] is providing the following services: RN  If patient discharges after hours, please call [phone].   Walter French 11/12/2015, 10:00 AM

## 2015-11-13 ENCOUNTER — Inpatient Hospital Stay (HOSPITAL_COMMUNITY): Payer: Medicare Other

## 2015-11-13 DIAGNOSIS — Z95810 Presence of automatic (implantable) cardiac defibrillator: Secondary | ICD-10-CM

## 2015-11-13 DIAGNOSIS — K5900 Constipation, unspecified: Secondary | ICD-10-CM

## 2015-11-13 LAB — BASIC METABOLIC PANEL
Anion gap: 10 (ref 5–15)
BUN: 33 mg/dL — AB (ref 6–20)
CHLORIDE: 87 mmol/L — AB (ref 101–111)
CO2: 32 mmol/L (ref 22–32)
Calcium: 9.2 mg/dL (ref 8.9–10.3)
Creatinine, Ser: 1.3 mg/dL — ABNORMAL HIGH (ref 0.61–1.24)
GFR calc Af Amer: >60 mL/min (ref >60)
GFR, EST NON AFRICAN AMERICAN: 55 mL/min — AB (ref >60)
GLUCOSE: 147 mg/dL — AB (ref 65–99)
POTASSIUM: 3.6 mmol/L (ref 3.5–5.1)
Sodium: 129 mmol/L — ABNORMAL LOW (ref 135–145)

## 2015-11-13 LAB — HEMOGLOBIN A1C
HEMOGLOBIN A1C: 5.6 % (ref 4.8–5.6)
MEAN PLASMA GLUCOSE: 114 mg/dL

## 2015-11-13 LAB — GLUCOSE, CAPILLARY
GLUCOSE-CAPILLARY: 167 mg/dL — AB (ref 65–99)
GLUCOSE-CAPILLARY: 162 mg/dL — AB (ref 65–99)
Glucose-Capillary: 139 mg/dL — ABNORMAL HIGH (ref 65–99)

## 2015-11-13 LAB — CBC
HEMATOCRIT: 33.6 % — AB (ref 39.0–52.0)
HEMOGLOBIN: 11 g/dL — AB (ref 13.0–17.0)
MCH: 27.6 pg (ref 26.0–34.0)
MCHC: 32.7 g/dL (ref 30.0–36.0)
MCV: 84.4 fL (ref 78.0–100.0)
PLATELETS: 171 10*3/uL (ref 150–400)
RBC: 3.98 MIL/uL — AB (ref 4.22–5.81)
RDW: 16.5 % — ABNORMAL HIGH (ref 11.5–15.5)
WBC: 6.8 10*3/uL (ref 4.0–10.5)

## 2015-11-13 MED ORDER — POTASSIUM CHLORIDE CRYS ER 20 MEQ PO TBCR
40.0000 meq | EXTENDED_RELEASE_TABLET | Freq: Every day | ORAL | Status: DC
  Administered 2015-11-13: 40 meq via ORAL
  Filled 2015-11-13: qty 2

## 2015-11-13 NOTE — Progress Notes (Signed)
Pt refuses CPAP. He says he can not tolerate it. Wishes to wear O2 only. RT will monitor.

## 2015-11-13 NOTE — Progress Notes (Signed)
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 Martinique Patient Profile: 69 y.o. 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 Successful DCCV on 11/09/2015 w/ return to NSR.  SUBJECTIVE: Still having a dry cough. Denies any chest pain or palpitations.  OBJECTIVE Filed Vitals:   11/12/15 1225 11/12/15 1600 11/12/15 2045 11/13/15 0605  BP: 103/55 118/63 111/59 105/67  Pulse: 68 84 89 90  Temp: 97.7 F (36.5 C)  97.3 F (36.3 C) 98.3 F (36.8 C)  TempSrc: Oral  Oral Oral  Resp: 20 20 22 20   Height:      Weight:    181 lb 6.4 oz (82.283 kg)  SpO2: 100% 92% 92% 87%    Intake/Output Summary (Last 24 hours) at 11/13/15 0934 Last data filed at 11/13/15 [id number]  Gross per 24 hour  Intake    570 ml  Output   2105 ml  Net  -1535 ml   Filed Weights   11/11/15 0520 11/12/15 0556 11/13/15 0605  Weight: 188 lb 4.8 oz (85.412 kg) 182 lb 1.6 oz (82.6 kg) 181 lb 6.4 oz (82.283 kg)    PHYSICAL EXAM General: Well developed, well nourished, male in no acute distress. Head: Normocephalic, atraumatic.  Neck: Supple without bruits, JVD not elevated. Lungs:  Resp regular and  unlabored, Rales present mostly on right side.  Heart: RRR, S1, S2, no S3, S4, or murmur; no rub. Abdomen: Soft, non-tender, non-distended with normoactive bowel sounds. No hepatomegaly. No rebound/guarding. No obvious abdominal masses. Extremities: No clubbing, cyanosis, or edema. Distal pedal pulses are 2+ bilaterally. Neuro: Alert and oriented X 3. Moves all extremities spontaneously. Psych: Normal affect.  LABS: CBC: Recent Labs  11/12/15 0344 11/13/15 0526  WBC 6.5 6.8  HGB 10.5* 11.0*  HCT 32.3* 33.6*  MCV 85.4 84.4  PLT 176 171   INR:No results for input(s): INR in the last 72 hours. Basic Metabolic Panel: Recent Labs  11/12/15 0344 11/13/15 0526  NA 131* 129*  K 2.9* 3.6  CL 88* 87*  CO2 33* 32  GLUCOSE 138* 147*  BUN 22* 33*  CREATININE 1.25* 1.30*  CALCIUM 8.9 9.2  MG 2.0  --   PHOS 4.3  --    Liver Function Tests: Recent Labs  11/12/15 0344  AST 49*  ALT 21  ALKPHOS 427*  BILITOT 1.1  PROT 7.3  ALBUMIN 2.1*   BNP:  B NATRIURETIC PEPTIDE  Date/Time Value Ref Range Status  11/09/2015 01:30 AM 723.3* 0.0 - 100.0 pg/mL Final  10/07/2015 02:58 AM 999.3* 0.0 - 100.0 pg/mL Final   D-dimer:No results for input(s): DDIMER in the last 72 hours. Hemoglobin A1C: Recent Labs  11/12/15 0344  HGBA1C 5.6  TELE:   NSR with rate in 70's - 80's. No acute events.     Radiology/Studies: Dg Chest Port 1 View: 11/13/2015  CLINICAL DATA:  CHF EXAM: PORTABLE CHEST 1 VIEW COMPARISON:  11/11/2015 FINDINGS: Interval improvement in interstitial edema. Vascular congestion and mild edema remains. Mild bibasilar atelectasis. Small left effusion IMPRESSION: Interval improvement in interstitial edema. Electronically Signed   By: Franchot Gallo M.D.   On: 11/13/2015 09:04   Dg Abd Portable 1v: 11/12/2015  CLINICAL DATA:  Patient with mid abdominal pain. EXAM: PORTABLE ABDOMEN - 1 VIEW COMPARISON:  PET-CT 08/07/2015; CT abdomen 07/23/2015. FINDINGS: Stool is present throughout  the colon. Gas is demonstrated within nondilated loops of large and small bowel in a nonobstructed pattern. Regional skeleton unremarkable. IMPRESSION: No bowel obstruction. Stool within the rectum and ascending colon. Electronically Signed   By: Lovey Newcomer M.D.   On: 11/12/2015 11:01     Current Medications:  . allopurinol  300 mg Oral Daily  . amiodarone  400 mg Oral Daily  . antiseptic oral rinse  7 mL Mouth Rinse BID  . apixaban  5 mg Oral BID  . atorvastatin  40 mg Oral Daily  . bisacodyl  10 mg Rectal Once  . digoxin  0.125 mg Oral Daily  . docusate sodium  100 mg Oral BID  . famotidine  20 mg Oral Daily  . feeding supplement (ENSURE ENLIVE)  237 mL Oral BID BM  . furosemide  40 mg Intravenous BID  . levothyroxine  75 mcg Oral QAC breakfast  . metoprolol tartrate  12.5 mg Oral BID  . polyethylene glycol  17 g Oral BID  . saccharomyces boulardii  250 mg Oral BID  . senna  2 tablet Oral QHS      ASSESSMENT AND PLAN:  1. Persistent atrial fib with a CVR/RVR - This patients CHA2DS2-VASc Score and unadjusted Ischemic Stroke Rate (% per year) is equal to 7.2 % stroke rate/year from a score of 5 (CHF, CAD, HTN, DM, Age). Continue Eliquis for anticoagulation. - had been compliant with Eliquis and Amiodarone as an outpatient. - underwent successful DCCV on 11/09/2015 with single 120J biphasic shock on 11/09/2015. - maintaining NSR. Continue Amiodarone, Digoxin, and BB at current doses.  2. Acute on Chronic Systolic and Diastolic CHF - net negative -4.2L since admission. - CXR on 11/13/2015 shows interval improvement in interstitial edema when compared to 11/11/2015. - weight down 10lbs since admission. (191 lbs --> 181 lbs on 11/13/2015). Patient reports his old baseline weight was 205lbs, but he has lost significant weight since his cancer treatment. Will need to establish new dry weight. - on Lasix 40mg  IV BID. Will likely require IV diuresis for at least the next 24-48 hours, then  can hopefully be converted back to PO.  3. 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 ICM - EF 30-35% by echo in 07/2015.  5. Remote VT - s/p ICD Implant 2003  6. CAD - s/p CABG with LIMA to LAD, SVG to PDA, SVG to PL. Patient grafts by cath in 07/2015. - continue statin and BB.  7. Acute Hypoxia - on 2L [place] currently at rest, requiring 4L with activity. According to the patient's nurse, he hates having his oxygen on ad takes it off intermittently throughout the day.  8. Hykokalemia - currently resolved   Arna Medici , PA-C 9:34 AM 11/13/2015 Pager: [phone] Patient seen and examined and history reviewed. Agree with above  findings and plan. Patient's breathing is improved. Diuresed another liter yesterday and weight is down one pound. CXR is improved but still wet. We need to continue diuresis with IV lasix. If diuresis slows can give another 2.5 mg of metolazone. I would like to make sure he is dry prior to DC. On last visit in October he was enrolled in the ReDS Vest trial and I would recommend getting a Vest reading again prior to DC when we feel he is ready for DC.    Fardowsa Authier [place], [place] 11/13/2015 10:06 AM

## 2015-11-13 NOTE — Progress Notes (Signed)
Physical Therapy Treatment Patient Details Name: Walter French MRN: [id number] DOB: 01-31-1946 Today's Date: 11/13/2015    History of Present Illness Walter French is a 69 y.o. male with a history of Gastroesophageal Cancer , CAD, CHF with EF= 30-35%. Persistent Atrial Fibrillation on Eliquis Rx, DM2, who presents to the ED with complaints of low blood sugars off and on for the past 2 weeks    PT Comments    Has been limited on every visit by this PT for his O2 sats and endurance, and needs a supportive recliner in his room to sit up and get stronger.  Talked with his CNA about this and she is attempting to find a chair as well.  Pt is not willing to be OOB yet so will have to use MD or other nursing staff members to talk with him about need to get stronger.  Follow Up Recommendations  Home health PT;Supervision/Assistance - 24 hour     Equipment Recommendations  Rolling walker with 5" wheels    Recommendations for Other Services Rehab consult     Precautions / Restrictions Precautions Precautions: Fall Precaution Comments: monitor O2 and safety Restrictions Weight Bearing Restrictions: No    Mobility  Bed Mobility Overal bed mobility: Modified Independent Bed Mobility: Supine to Sit;Sit to Supine     Supine to sit: Supervision (for safety) Sit to supine: Supervision   General bed mobility comments: to assist lines  Transfers Overall transfer level: Needs assistance (has bed down and supervised for safety)               General transfer comment: not able to stand due to SOB and low O2 sats  Ambulation/Gait                 Stairs            Wheelchair Mobility    Modified Rankin (Stroke Patients Only)       Balance                                    Cognition Arousal/Alertness: Lethargic Behavior During Therapy: Flat affect Overall Cognitive Status: Within Functional Limits for tasks assessed                      Exercises      General Comments General comments (skin integrity, edema, etc.): Very poor endurance and at bedside went from 88% sat to 82% and could not sustain even when increasing the O2 to 3L      Pertinent Vitals/Pain Pain Assessment: No/denies pain    Home Living                      Prior Function            PT Goals (current goals can now be found in the care plan section) Acute Rehab PT Goals Patient Stated Goal: feel better PT Goal Formulation: With patient Progress towards PT goals: Not progressing toward goals - comment;PT to reassess next treatment    Frequency  Min 3X/week    PT Plan Current plan remains appropriate    Co-evaluation             End of Session Equipment Utilized During Treatment: Oxygen Activity Tolerance: Patient limited by fatigue;Patient limited by lethargy;Treatment limited secondary to medical complications (Comment) Patient left: in bed;with call bell/phone within reach;with  bed alarm set     Time: [id number] PT Time Calculation (min) (ACUTE ONLY): 23 min  Charges:  $Therapeutic Activity: 23-37 mins                    G Codes:      Ramond Dial 12/04/2015, 1:40 PM   Mee Hives, PT MS Acute Rehab Dept. Number: [phone] [id number] and [place] [phone]

## 2015-11-13 NOTE — Progress Notes (Signed)
[facility] PROGRESS NOTE  Walter French DGL:[id number] DOB: [birth year] DOA: 11/07/2015 PCP: Karlene Einstein, MD  Brief Summary  69 y/o male admitted 10/05/15 with Afib RVR CHad2Vasc2 score=5, Failed DCCV and intolerant BB and therefore on Amio and digoxin, CAd s/p CABG x3, chronic systolic heart failure with EF 30-35% 07/2015, H/o VT s/p [employer] ICD, adenoCa [place] 07/2015 s/p XRT and Taxol/carbioplain completing Rx 09/2015.  Admitted due to hypoglycemia due to poor PO intake despite being taken off his insulin and started on glucotrol.    Review of chart indicates poor compliance in the past.  Declines SNF.    Assessment/Plan  Acute hypoxic respiratory failure due to acute on chronic systolic heart failure, BUN and creatinine rising slightly but still on >4L O2 with dyspnea and interstitial edema on CXR -  Continuing lasix 28m IV BID per cardiology -  Received one dose of metolazone on 11/30 -  Negative 1 L -  Weight stable yesterday to today -  Last ECHO just a month ago and therefore do not need to repeat -  Creatinine rising slightly -  Continue to wean O2 as tolerated -  CXR:  Improvement in vascular congestion  Atrial fibrillation s/p successful DCCV on 11/09/2015, now on amiodarone which should be tapered as outpatient per cardiology and digoxin -  Tele:  NSR -  CHad2Vasc2 score= 5 -  Continue apixaban  Abdominal pain likely secondary to constipation -  LFTs, lipase:  notable for elevated alk phos -  UA: neg -  KUB: c/w constipation -  Start bisacodyl supp, senna, colace, and scheduled miralax  Hypoglycemia due to malignancy associated weight loss  -  A1c 5.6 -  Continue supplements   Hypothyroidism, stable, continue levothyroxine 742m daily and repeat TSH in late December  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  Diet:  Low sodium  Access:  PIV IVF:  off Proph:  apixaban  Code Status: full Family Communication: patient alone Disposition Plan: declines SNF and [place] [facility] [place] [facility].  Per cardiology, ongoing IV diuresis for probably a few more days.  Still very SOB at rest   Consultants:  CArdiology  Diabetic educator  Procedures:  DCCV on 11/28  Antibiotics:  none   HPI/Subjective:  Still SOB and feels like breathing is getting worse.  Abdominal pain improving.  Declined suppository but still no BM since admission.     Objective: Filed Vitals:   11/12/15 1600 11/12/15 2045 11/13/15 0605 11/13/15 1119  BP: 118/63 111/59 105/67 114/62  Pulse: 84 89 90 87  Temp:  97.3 F (36.3 C) 98.3 F (36.8 C) 97.5 F (36.4 C)  TempSrc:  Oral Oral Oral  Resp: _0 Height:      Weight:   82.283 kg (181 lb 6.4 oz)   SpO2: 92% 92% 87% 97%    Intake/Output Summary (Last 24 hours) at 11/13/15 1537 Last data filed at 11/13/15 1355  Gross per 24 hour  Intake    790 ml  Output   1300 ml  Net   -510 ml   Filed Weights   11/11/15 0520 11/12/15 0556 11/13/15 0605  Weight: 85.412 kg (188 lb 4.8 oz) 82.6 kg (182 lb 1.6 oz) 82.283 kg (181  lb 6.4 oz)   Body mass index is 24.6 kg/(m^2).  Exam:   General:  Adult male, still winded resting in bed  HEENT:  NCAT, MMM  Cardiovascular:  RRR, nl S1, S2 no mrg, 2+ pulses, warm extremities  Respiratory:  Rales and diminished at the right base, no wheezes or rhonchi  Abdomen:   NABS, soft, NT/ND  MSK:   Normal tone and bulk, no LEE  Neuro:  Diffusely weak  Data Reviewed: Basic Metabolic Panel:  Recent Labs Lab 11/09/15 0130 11/10/15 0446 11/11/15 0824 11/12/15 0344 11/13/15 0526  NA 128* 132* 132* 131* 129*  K 3.4* 3.4* 3.3* 2.9* 3.6  CL 93* 93* 92* 88* 87*  CO2 25 29 32 33* 32   GLUCOSE 167* 150* 131* 138* 147*  BUN _0 22* 33*  CREATININE 1.17 1.17 1.26* 1.25* 1.30*  CALCIUM 8.4* 8.6* 9.0 8.9 9.2  MG  --  2.1  --  2.0  --   PHOS  --   --   --  4.3  --    Liver Function Tests:  Recent Labs Lab 11/07/15 2235 11/09/15 0130 11/12/15 0344  AST 54* 45* 49*  ALT _1 ALKPHOS 365* 345* 427*  BILITOT 0.9 1.4* 1.1  PROT 6.6 6.7 7.3  ALBUMIN 2.1* 2.0* 2.1*    Recent Labs Lab 11/12/15 0344  LIPASE 47   No results for input(s): AMMONIA in the last 168 hours. CBC:  Recent Labs Lab 11/07/15 2235 11/08/15 0406 11/09/15 0130 11/11/15 0824 11/12/15 0344 11/13/15 0526  WBC 7.9 6.3 7.4 6.0 6.5 6.8  NEUTROABS 6.4  --   --   --   --   --   HGB 9.7* 8.1* 9.8* 10.0* 10.5* 11.0*  HCT 29.9* 24.8* 29.7* 30.7* 32.3* 33.6*  MCV 86.9 86.7 85.1 86.0 85.4 84.4  PLT 171 131* 154 168 176 171    Recent Results (from the past 240 hour(s))  MRSA PCR Screening     Status: None   Collection Time: 11/08/15  1:11 AM  Result Value Ref Range Status   MRSA by PCR NEGATIVE NEGATIVE Final    Comment:        The GeneXpert MRSA Assay (FDA approved for NASAL specimens only), is one component of a comprehensive MRSA colonization surveillance program. It is not intended to diagnose MRSA infection nor to guide or monitor treatment for MRSA infections.      Studies: Dg Chest Port 1 View  11/13/2015  CLINICAL DATA:  CHF EXAM: PORTABLE CHEST 1 VIEW COMPARISON:  11/11/2015 FINDINGS: Interval improvement in interstitial edema. Vascular congestion and mild edema remains. Mild bibasilar atelectasis. Small left effusion IMPRESSION: Interval improvement in interstitial edema. Electronically Signed   By: Franchot Gallo M.D.   On: 11/13/2015 09:04   Dg Abd Portable 1v  11/12/2015  CLINICAL DATA:  Patient with mid abdominal pain. EXAM: PORTABLE ABDOMEN - 1 VIEW COMPARISON:  PET-CT 08/07/2015; CT abdomen 07/23/2015. FINDINGS: Stool is present throughout the colon. Gas is  demonstrated within nondilated loops of large and small bowel in a nonobstructed pattern. Regional skeleton unremarkable. IMPRESSION: No bowel obstruction. Stool within the rectum and ascending colon. Electronically Signed   By: Lovey Newcomer M.D.   On: 11/12/2015 11:01    Scheduled Meds: . allopurinol  300 mg Oral Daily  . amiodarone  400 mg Oral Daily  . antiseptic oral rinse  7 mL Mouth Rinse BID  . apixaban  5 mg Oral BID  .  atorvastatin  40 mg Oral Daily  . bisacodyl  10 mg Rectal Once  . digoxin  0.125 mg Oral Daily  . docusate sodium  100 mg Oral BID  . famotidine  20 mg Oral Daily  . feeding supplement (ENSURE ENLIVE)  237 mL Oral BID BM  . furosemide  40 mg Intravenous BID  . levothyroxine  75 mcg Oral QAC breakfast  . metoprolol tartrate  12.5 mg Oral BID  . polyethylene glycol  17 g Oral BID  . saccharomyces boulardii  250 mg Oral BID  . senna  2 tablet Oral QHS   Continuous Infusions:   Active Problems:   Hyperlipidemia   Coronary artery disease-status post CABG   Automatic implantable cardioverter-defibrillator in situ   Systolic and diastolic CHF, chronic (HCC)   Gastroesophageal cancer (HCC)   PAF- s/p DCCV this admission   Anemia of chronic disease   Hypothyroidism   Acute on chronic combined systolic (congestive) and diastolic (congestive) heart failure (HCC)   Chronic anticoagulation - Eliquis, CHADS2VASC=5   Hypotension   Cardiomyopathy, ischemic-30-35%   Malnutrition of moderate degree   Constipation    Time spent: 30 min    SHORT, [place]  [facility] Pager [phone]. If 7PM-7AM, please contact night-coverage at [email or web address], password [facility] 11/13/2015, 3:37 PM  LOS: 6 days

## 2015-11-14 ENCOUNTER — Inpatient Hospital Stay (HOSPITAL_COMMUNITY): Payer: Medicare Other

## 2015-11-14 DIAGNOSIS — E440 Moderate protein-calorie malnutrition: Secondary | ICD-10-CM

## 2015-11-14 DIAGNOSIS — C159 Malignant neoplasm of esophagus, unspecified: Secondary | ICD-10-CM

## 2015-11-14 LAB — CBC
HEMATOCRIT: 35.1 % — AB (ref 39.0–52.0)
HEMOGLOBIN: 11.1 g/dL — AB (ref 13.0–17.0)
MCH: 26.9 pg (ref 26.0–34.0)
MCHC: 31.6 g/dL (ref 30.0–36.0)
MCV: 85.2 fL (ref 78.0–100.0)
Platelets: 204 10*3/uL (ref 150–400)
RBC: 4.12 MIL/uL — AB (ref 4.22–5.81)
RDW: 16.6 % — ABNORMAL HIGH (ref 11.5–15.5)
WBC: 8.6 10*3/uL (ref 4.0–10.5)

## 2015-11-14 LAB — BASIC METABOLIC PANEL
ANION GAP: 11 (ref 5–15)
BUN: 43 mg/dL — ABNORMAL HIGH (ref 6–20)
CALCIUM: 9.3 mg/dL (ref 8.9–10.3)
CO2: 32 mmol/L (ref 22–32)
Chloride: 87 mmol/L — ABNORMAL LOW (ref 101–111)
Creatinine, Ser: 1.45 mg/dL — ABNORMAL HIGH (ref 0.61–1.24)
GFR calc non Af Amer: 48 mL/min — ABNORMAL LOW (ref >60)
GFR, EST AFRICAN AMERICAN: 56 mL/min — AB (ref >60)
GLUCOSE: 158 mg/dL — AB (ref 65–99)
POTASSIUM: 3.3 mmol/L — AB (ref 3.5–5.1)
Sodium: 130 mmol/L — ABNORMAL LOW (ref 135–145)

## 2015-11-14 LAB — GLUCOSE, CAPILLARY
GLUCOSE-CAPILLARY: 129 mg/dL — AB (ref 65–99)
GLUCOSE-CAPILLARY: 192 mg/dL — AB (ref 65–99)
GLUCOSE-CAPILLARY: 245 mg/dL — AB (ref 65–99)
GLUCOSE-CAPILLARY: 163 mg/dL — AB (ref 65–99)

## 2015-11-14 MED ORDER — CARVEDILOL 6.25 MG PO TABS
6.2500 mg | ORAL_TABLET | Freq: Two times a day (BID) | ORAL | Status: DC
  Administered 2015-11-14 – 2015-11-16 (×5): 6.25 mg via ORAL
  Filled 2015-11-14 (×5): qty 1

## 2015-11-14 MED ORDER — ALBUTEROL SULFATE (2.5 MG/3ML) 0.083% IN NEBU
INHALATION_SOLUTION | RESPIRATORY_TRACT | Status: AC
  Administered 2015-11-14: 2.5 mg via RESPIRATORY_TRACT
  Filled 2015-11-14: qty 3

## 2015-11-14 MED ORDER — AMIODARONE HCL 200 MG PO TABS: 200.0000 mg | ORAL_TABLET | Freq: Every day | ORAL | Status: DC

## 2015-11-14 MED ORDER — FUROSEMIDE 10 MG/ML IJ SOLN: 80.0000 mg | Freq: Two times a day (BID) | INTRAMUSCULAR | Status: DC

## 2015-11-14 MED ORDER — BISACODYL 5 MG PO TBEC
10.0000 mg | DELAYED_RELEASE_TABLET | Freq: Once | ORAL | Status: AC
  Administered 2015-11-14: 10 mg via ORAL
  Filled 2015-11-14: qty 2

## 2015-11-14 MED ORDER — DOXYCYCLINE HYCLATE 100 MG PO TABS
100.0000 mg | ORAL_TABLET | Freq: Two times a day (BID) | ORAL | Status: DC
  Administered 2015-11-14 – 2015-11-16 (×4): 100 mg via ORAL
  Filled 2015-11-14 (×4): qty 1

## 2015-11-14 MED ORDER — POTASSIUM CHLORIDE CRYS ER 20 MEQ PO TBCR
30.0000 meq | EXTENDED_RELEASE_TABLET | Freq: Two times a day (BID) | ORAL | Status: DC
  Administered 2015-11-14 – 2015-11-16 (×5): 30 meq via ORAL
  Filled 2015-11-14 (×6): qty 1

## 2015-11-14 MED ORDER — ALBUTEROL SULFATE (2.5 MG/3ML) 0.083% IN NEBU
2.5000 mg | INHALATION_SOLUTION | RESPIRATORY_TRACT | Status: DC | PRN
  Administered 2015-11-14: 2.5 mg via RESPIRATORY_TRACT

## 2015-11-14 NOTE — Progress Notes (Signed)
Dr Sheran Fava called informed of patient being very SOB RE SP at 24 and diminished , orders received ,neb TX given as per ordered. Will continue to monitor patient .

## 2015-11-14 NOTE — Progress Notes (Signed)
Pt refuses to wear CPAP.  °

## 2015-11-14 NOTE — Progress Notes (Signed)
SUBJECTIVE: The patient remains very ill.  More SOB.  No chest pain.  [name] allopurinol  300 mg Oral Daily  . [START ON 11/15/2015] amiodarone  200 mg Oral Daily  . antiseptic oral rinse  7 mL Mouth Rinse BID  . apixaban  5 mg Oral BID  . atorvastatin  40 mg Oral Daily  . bisacodyl  10 mg Rectal Once  . digoxin  0.125 mg Oral Daily  . docusate sodium  100 mg Oral BID  . famotidine  20 mg Oral Daily  . feeding supplement (ENSURE ENLIVE)  237 mL Oral BID BM  . furosemide  80 mg Intravenous BID  . levothyroxine  75 mcg Oral QAC breakfast  . metoprolol tartrate  12.5 mg Oral BID  . polyethylene glycol  17 g Oral BID  . potassium chloride  30 mEq Oral BID  . saccharomyces boulardii  250 mg Oral BID  . senna  2 tablet Oral QHS      OBJECTIVE: Physical Exam: Filed Vitals:   11/13/15 1119 11/13/15 2022 11/14/15 0420 11/14/15 1114  BP: 114/62 112/49 125/74 104/58  Pulse: 87 89  80  Temp: 97.5 F (36.4 C) 97.9 F (36.6 C) 98 F (36.7 C) 97.9 F (36.6 C)  TempSrc: Oral Oral Oral Oral  Resp: 22 22 22 18   Height:      Weight:   178 lb 12.7 oz (81.1 kg)   SpO2: 97% 95% 93% 97%    Intake/Output Summary (Last 24 hours) at 11/14/15 1320 Last data filed at 11/14/15 1115  Gross per 24 hour  Intake    830 ml  Output   1050 ml  Net   -220 ml    Telemetry reveals sinus rhythm  GEN- The patient is chronically ill appearing, alert and oriented x 3 today.   Head- normocephalic, atraumatic Eyes-  Sclera clear, conjunctiva pink Ears- hearing intact Oropharynx- clear Neck- supple, elevated JVP Lungs- decreased BS at the bases, normal work of breathing Heart- Regular rate and rhythm  GI- soft, NT, ND, + BS Extremities- no clubbing, cyanosis, + dependant edema Skin- no rash or lesion Psych- euthymic mood, full affect Neuro- strength and sensation are intact  LABS: Basic Metabolic Panel:  Recent Labs  11/12/15 0344 11/13/15 0526 11/14/15 0233  NA 131* 129* 130*  K 2.9* 3.6  3.3*  CL 88* 87* 87*  CO2 33* 32 32  GLUCOSE 138* 147* 158*  BUN 22* 33* 43*  CREATININE 1.25* 1.30* 1.45*  CALCIUM 8.9 9.2 9.3  MG 2.0  --   --   PHOS 4.3  --   --    Liver Function Tests:  Recent Labs  11/12/15 0344  AST 49*  ALT 21  ALKPHOS 427*  BILITOT 1.1  PROT 7.3  ALBUMIN 2.1*    Recent Labs  11/12/15 0344  LIPASE 47   CBC:  Recent Labs  11/13/15 0526 11/14/15 0233  WBC 6.8 8.6  HGB 11.0* 11.1*  HCT 33.6* 35.1*  MCV 84.4 85.2  PLT 171 204   Hemoglobin A1C:  Recent Labs  11/12/15 0344  HGBA1C 5.6   RADIOLOGY: Dg Chest 2 View  11/11/2015  CLINICAL DATA:  Increased shortness of breath over the past 2 days, generalize weakness; history of atrial fibrillation, CHF, previous CABG, and cardiac dysrhythmias. EXAM: CHEST  2 VIEW COMPARISON:  Portable chest x-ray of November 09, 2015 FINDINGS: The lungs are reasonably well inflated. The interstitial markings remain increased bilaterally. There is no  significant pleural effusion. The cardiac silhouette remains enlarged. The central pulmonary vascularity remains engorged. A permanent pacemaker defibrillator is present and stable. The patient has undergone previous median sternotomy. There are multiple broken sternal wires. The bony thorax exhibits no acute abnormality. Cardiac monitoring electrodes overlie the upper abdomen. There is a structure that could reflect a venous catheter or mediastinal drain in the midline though I favor this reflecting a electrode. Correlation clinically in this regard is needed. IMPRESSION: CHF with mild interstitial edema. There is no alveolar pneumonia nor other acute cardiopulmonary abnormality. Electronically Signed   By: David  Martinique M.D.   On: 11/11/2015 07:46   Dg Chest 2 View  11/08/2015  CLINICAL DATA:  Shortness of breath. Pt in afib and desatting during the exam. EXAM: CHEST  2 VIEW COMPARISON:  10/05/2015 FINDINGS: Stable cardiac enlargement and cardiac pacer. Low lung  volumes. Severe bilateral interstitial change. Retrocardiac consolidation. IMPRESSION: Findings suggest congestive heart failure with severe interstitial edema Electronically Signed   By: Skipper Cliche M.D.   On: 11/08/2015 14:12   Dg Chest Port 1 View  11/13/2015  CLINICAL DATA:  CHF EXAM: PORTABLE CHEST 1 VIEW COMPARISON:  11/11/2015 FINDINGS: Interval improvement in interstitial edema. Vascular congestion and mild edema remains. Mild bibasilar atelectasis. Small left effusion IMPRESSION: Interval improvement in interstitial edema. Electronically Signed   By: Franchot Gallo M.D.   On: 11/13/2015 09:04   Dg Chest Port 1 View  11/09/2015  CLINICAL DATA:  Congestive heart failure. EXAM: PORTABLE CHEST 1 VIEW COMPARISON:  11/08/2015 at 13:30 FINDINGS: There is partial clearance of vascular and interstitial congestive changes. There continues to be mild ground-glass opacity in the medial base regions. Probable small left pleural effusion. Grossly intact appearances of the transvenous cardiac lead. IMPRESSION: Slight improvement with partial clearance of congestive changes compared to the earlier study Electronically Signed   By: Andreas Newport M.D.   On: 11/09/2015 01:52   Dg Abd Portable 1v  11/12/2015  CLINICAL DATA:  Patient with mid abdominal pain. EXAM: PORTABLE ABDOMEN - 1 VIEW COMPARISON:  PET-CT 08/07/2015; CT abdomen 07/23/2015. FINDINGS: Stool is present throughout the colon. Gas is demonstrated within nondilated loops of large and small bowel in a nonobstructed pattern. Regional skeleton unremarkable. IMPRESSION: No bowel obstruction. Stool within the rectum and ascending colon. Electronically Signed   By: Lovey Newcomer M.D.   On: 11/12/2015 11:01    ASSESSMENT AND PLAN:   1. Persistent atrial fib with a CVR/RVR Continue Eliquis for anticoagulation as long as creatinine is stable Reduce amiodarone to 200mg  daily  2. Acute on Chronic Systolic and Diastolic CHF - weight is down quite a  bit since admit Creatinine is starting to bump Will give lasix 80mg  IV x 1 tonight and then reassess tomorrow It may be that we have reached our limit with duresis based on bun/ creatinine EF 30% Will switch metoprolol to coreg and consider ace inhibitor if creatinine remains stable  3. 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 Remote VT - s/p ICD Implant 2003  5. CAD - s/p CABG with LIMA to LAD, SVG to PDA, SVG to PL. Patient grafts by cath in 07/2015. - continue statin and BB.  6. Acute Hypoxia -seems out of proportion with CHF Plans for chest ct noted Will reduce amiodarone Amiodarone toxicity seems unlikely but is on our ddx  7. Hykokalemia - primary team to replete   I worry that prognosis is very poor.  Would  consider palliative care consultation if patient is amenable.  Thompson Grayer, MD 11/14/2015 1:20 PM

## 2015-11-14 NOTE — Progress Notes (Signed)
[facility] PROGRESS NOTE  Walter French MGN:[id number] DOB: December 29, 1945 DOA: 11/07/2015 PCP: Karlene Einstein, MD  Brief Summary  69 y/o male admitted 10/05/15 with Afib RVR CHad2Vasc2 score=5, Failed DCCV and intolerant BB and therefore on Amio and digoxin, CAd s/p CABG x3, chronic systolic heart failure with EF 30-35% 07/2015, H/o VT s/p [employer] ICD, adenoCa [place] 07/2015 s/p XRT and Taxol/carbioplain completing Rx 09/2015.  Admitted due to hypoglycemia due to poor PO intake despite being taken off his insulin and started on glucotrol.    Review of chart indicates poor compliance in the past.  Declines SNF.    Assessment/Plan  Acute hypoxic respiratory failure due to acute on chronic systolic heart failure, EF 30-35%, BUN and creatinine rising and not diuresing much but still very SOB and on 4-5L [place].  Cardiology concerned about amiodarone toxicity.  Received 7-days of XRT 2 months ago with carboplatin and paclitaxel.  XRT may have effected lung.  Chemotherapeutic agents are not typical causes of pneumonitis.   -  Weight down 1kg and +338m -  Creatinine rising >> d/c lasix  -  CT chest:  Concerning for amiodarone toxicity or atypical pneumonia >> most concerning for amiodarone toxicity due to lack of fever, leukocytosis or other symptoms of pneumonia.  -  D/c amiodarone -  Check legionella urine antigen  -  Start doxycycline, however, if no improvement after 2-3 days, will start steroids and call palliative care consultation   Atrial fibrillation s/p successful DCCV on 11/09/2015 -  D/c amiodarone due to possibly amiodarone-induced pulmonary toxicity -  Tele:  NSR -  CHad2Vasc2 score= 5 -  Continue apixaban  Abdominal pain likely secondary to constipation -  LFTs, lipase:  notable for elevated alk phos -  UA: neg -  KUB: c/w constipation -  Start bisacodyl supp, senna, colace, and scheduled miralax  Hypoglycemia due to malignancy associated weight loss  -  A1c  5.6 -  Continue supplements   Hypothyroidism, stable, continue levothyroxine 783m daily and repeat TSH in late December  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  Diet:  Low sodium  Access:  PIV IVF:  off Proph:  apixaban  Code Status: full Family Communication: patient alone Disposition Plan:  Still very SOB.  CT scan appears concerning for possible amiodarone toxicity.  Given his rapidly decreasing functional status and weight loss, if he does not start to recover from his breathing quickly (which would likely not happen for months if he has amiodarone toxicity), he would be a candidate for hospice.  If no improvement in a couple of days on antibiotics, would start very long prednisone taper and call a palliative care consultation.  Consultants:    Cardiology  Diabetic educator  Procedures:  DCCV on 11/28  Antibiotics:  none   HPI/Subjective:  Still SOB and feels like breathing is getting worse.  Abdominal pain improving.  Declined suppository but still no BM since admission.     Objective: Filed Vitals:   11/13/15 1119 11/13/15 2022 11/14/15 0420 11/14/15 1114  BP: 114/62 112/49 125/74 104/58  Pulse: 87 89  80  Temp: 97.5 F (36.4 C) 97.9 F (36.6 C) 98 F (36.7 C) 97.9 F (36.6 C)  TempSrc: Oral Oral Oral Oral  Resp: 22  22 22 18   Height:      Weight:   81.1 kg (178 lb 12.7 oz)   SpO2: 97% 95% 93% 97%    Intake/Output Summary (Last 24 hours) at 11/14/15 1540 Last data filed at 11/14/15 1115  Gross per 24 hour  Intake    710 ml  Output   1050 ml  Net   -340 ml   Filed Weights   11/12/15 0556 11/13/15 0605 11/14/15 0420  Weight: 82.6 kg (182 lb 1.6 oz) 82.283 kg (181 lb 6.4 oz) 81.1 kg  (178 lb 12.7 oz)   Body mass index is 24.24 kg/(m^2).  Exam:   General:  Adult male, still winded resting in bed  HEENT:  NCAT, MMM  Cardiovascular:  RRR, nl S1, S2 no mrg, 2+ pulses, warm extremities  Respiratory:   Diminished bilateral breath sounds, no wheezes or rhonchi or rales today  Abdomen:   NABS, soft, NT/ND  MSK:   Normal tone and bulk, no LEE  Neuro:  Diffusely weak  Data Reviewed: Basic Metabolic Panel:  Recent Labs Lab 11/10/15 0446 11/11/15 0824 11/12/15 0344 11/13/15 0526 11/14/15 0233  NA 132* 132* 131* 129* 130*  K 3.4* 3.3* 2.9* 3.6 3.3*  CL 93* 92* 88* 87* 87*  CO2 29 32 33* 32 32  GLUCOSE 150* 131* 138* 147* 158*  BUN 16 19 22* 33* 43*  CREATININE 1.17 1.26* 1.25* 1.30* 1.45*  CALCIUM 8.6* 9.0 8.9 9.2 9.3  MG 2.1  --  2.0  --   --   PHOS  --   --  4.3  --   --    Liver Function Tests:  Recent Labs Lab 11/07/15 2235 11/09/15 0130 11/12/15 0344  AST 54* 45* 49*  ALT 20 19 21   ALKPHOS 365* 345* 427*  BILITOT 0.9 1.4* 1.1  PROT 6.6 6.7 7.3  ALBUMIN 2.1* 2.0* 2.1*    Recent Labs Lab 11/12/15 0344  LIPASE 47   No results for input(s): AMMONIA in the last 168 hours. CBC:  Recent Labs Lab 11/07/15 2235  11/09/15 0130 11/11/15 0824 11/12/15 0344 11/13/15 0526 11/14/15 0233  WBC 7.9  < > 7.4 6.0 6.5 6.8 8.6  NEUTROABS 6.4  --   --   --   --   --   --   HGB 9.7*  < > 9.8* 10.0* 10.5* 11.0* 11.1*  HCT 29.9*  < > 29.7* 30.7* 32.3* 33.6* 35.1*  MCV 86.9  < > 85.1 86.0 85.4 84.4 85.2  PLT 171  < > 154 168 176 171 204  < > = values in this interval not displayed.  Recent Results (from the past 240 hour(s))  MRSA PCR Screening     Status: None   Collection Time: 11/08/15  1:11 AM  Result Value Ref Range Status   MRSA by PCR NEGATIVE NEGATIVE Final    Comment:        The GeneXpert MRSA Assay (FDA approved for NASAL specimens only), is one component of a comprehensive MRSA colonization surveillance program. It is  not intended to diagnose MRSA infection nor to guide or monitor treatment for MRSA infections.      Studies: Ct Chest Wo Contrast  11/14/2015  CLINICAL DATA:  Dyspnea.  Adenocarcinoma of the esophagus EXAM: CT CHEST WITHOUT CONTRAST TECHNIQUE: Multidetector CT imaging of the chest was performed following the standard protocol without IV contrast. COMPARISON:  09/10/2015 FINDINGS: THORACIC INLET/BODY WALL: Single chamber ICD/pacer from the left with lead in  the right ventricle. MEDIASTINUM: There is chronic cardiomegaly. No pericardial effusion. Extensive coronary atherosclerosis, status post CABG. Thickening of the distal esophagus correlating with history of esophageal cancer. Lower periesophageal nodes are decreased in size. LUNG WINDOWS: There is asymmetric patchy ground-glass opacity with interstitial coarsening in the right more than left lungs. Subtle but convincing beaded appearance of right lower lobe bronchi consistent with a fibrotic process. No macroscopic honeycombing. These changes have developed since 09/10/2015. No diffuse interlobular septal thickening typical of edema. No effusion. No dense consolidation. UPPER ABDOMEN: Bilateral renal calcifications appear atherosclerotic. Cystic change noted in the left kidney. OSSEOUS: No acute fracture.  No suspicious lytic or blastic lesions. IMPRESSION: 1. Interstitial and ground-glass airspace opacities in the right more than left lungs. Often this is from asymmetric/resolving edema or atypical infection, but in this case there is beaded appearance of right lower lobe bronchi suggesting a fibrotic process and inflammatory pneumonia. Question drug toxicity (especially from the patient's amiodarone use). 2. Treated esophageal cancer with stable lower esophageal thickening. Periesophageal nodal size is decreased from September 2016. Electronically Signed   By: Monte Fantasia M.D.   On: 11/14/2015 15:37   Dg Chest Port 1 View  11/13/2015  CLINICAL  DATA:  CHF EXAM: PORTABLE CHEST 1 VIEW COMPARISON:  11/11/2015 FINDINGS: Interval improvement in interstitial edema. Vascular congestion and mild edema remains. Mild bibasilar atelectasis. Small left effusion IMPRESSION: Interval improvement in interstitial edema. Electronically Signed   By: Franchot Gallo M.D.   On: 11/13/2015 09:04    Scheduled Meds: . allopurinol  300 mg Oral Daily  . [START ON 11/15/2015] amiodarone  200 mg Oral Daily  . antiseptic oral rinse  7 mL Mouth Rinse BID  . apixaban  5 mg Oral BID  . atorvastatin  40 mg Oral Daily  . bisacodyl  10 mg Rectal Once  . carvedilol  6.25 mg Oral BID WC  . digoxin  0.125 mg Oral Daily  . docusate sodium  100 mg Oral BID  . famotidine  20 mg Oral Daily  . feeding supplement (ENSURE ENLIVE)  237 mL Oral BID BM  . furosemide  80 mg Intravenous BID  . levothyroxine  75 mcg Oral QAC breakfast  . polyethylene glycol  17 g Oral BID  . potassium chloride  30 mEq Oral BID  . saccharomyces boulardii  250 mg Oral BID  . 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 If 7PM-7AM, please contact night-coverage at [email or web address], password [facility] 11/14/2015, 3:40 PM  LOS: 7 days

## 2015-11-15 LAB — CBC
HCT: 34.1 % — ABNORMAL LOW (ref 39.0–52.0)
HEMOGLOBIN: 11 g/dL — AB (ref 13.0–17.0)
MCH: 27.3 pg (ref 26.0–34.0)
MCHC: 32.3 g/dL (ref 30.0–36.0)
MCV: 84.6 fL (ref 78.0–100.0)
Platelets: 185 10*3/uL (ref 150–400)
RBC: 4.03 MIL/uL — AB (ref 4.22–5.81)
RDW: 16.4 % — ABNORMAL HIGH (ref 11.5–15.5)
WBC: 8.4 10*3/uL (ref 4.0–10.5)

## 2015-11-15 LAB — BASIC METABOLIC PANEL
ANION GAP: 10 (ref 5–15)
BUN: 43 mg/dL — ABNORMAL HIGH (ref 6–20)
CO2: 32 mmol/L (ref 22–32)
Calcium: 9.2 mg/dL (ref 8.9–10.3)
Chloride: 88 mmol/L — ABNORMAL LOW (ref 101–111)
Creatinine, Ser: 1.43 mg/dL — ABNORMAL HIGH (ref 0.61–1.24)
GFR calc non Af Amer: 49 mL/min — ABNORMAL LOW (ref >60)
GFR, EST AFRICAN AMERICAN: 57 mL/min — AB (ref >60)
Glucose, Bld: 158 mg/dL — ABNORMAL HIGH (ref 65–99)
Potassium: 3.8 mmol/L (ref 3.5–5.1)
Sodium: 130 mmol/L — ABNORMAL LOW (ref 135–145)

## 2015-11-15 LAB — GLUCOSE, CAPILLARY
GLUCOSE-CAPILLARY: 174 mg/dL — AB (ref 65–99)
GLUCOSE-CAPILLARY: 207 mg/dL — AB (ref 65–99)
GLUCOSE-CAPILLARY: 182 mg/dL — AB (ref 65–99)
GLUCOSE-CAPILLARY: 156 mg/dL — AB (ref 65–99)

## 2015-11-15 NOTE — Progress Notes (Signed)
       Patient Name: Walter French Date of Encounter: 11/15/2015    SUBJECTIVE: Breathing better than yesterday according to family. He is lying flat in bed.  TELEMETRY:  Normal sinus rhythm and sinus bradycardia on monitor. Filed Vitals:   11/14/15 0420 11/14/15 1114 11/14/15 2041 11/15/15 0546  BP: 125/74 104/58 120/65 116/61  Pulse:  80 84 79  Temp: 98 F (36.7 C) 97.9 F (36.6 C) 97.8 F (36.6 C) 97.9 F (36.6 C)  TempSrc: Oral Oral Oral Oral  Resp: 22 18 18 18   Height:      Weight: 178 lb 12.7 oz (81.1 kg)   179 lb 14.4 oz (81.602 kg)  SpO2: 93% 97% 97% 99%    Intake/Output Summary (Last 24 hours) at 11/15/15 1136 Last data filed at 11/15/15 0813  Gross per 24 hour  Intake    440 ml  Output    900 ml  Net   -460 ml   LABS: Basic Metabolic Panel:  Recent Labs  11/14/15 0233 11/15/15 0309  NA 130* 130*  K 3.3* 3.8  CL 87* 88*  CO2 32 32  GLUCOSE 158* 158*  BUN 43* 43*  CREATININE 1.45* 1.43*  CALCIUM 9.3 9.2   CBC:  Recent Labs  11/14/15 0233 11/15/15 0309  WBC 8.6 8.4  HGB 11.1* 11.0*  HCT 35.1* 34.1*  MCV 85.2 84.6  PLT 204 185    Radiology/Studies:  Reviewed CT scan reports  Physical Exam: Blood pressure 116/61, pulse 79, temperature 97.9 F (36.6 C), temperature source Oral, resp. rate 18, height 6' (1.829 m), weight 179 lb 14.4 oz (81.602 kg), SpO2 99 %. Weight change: 1 lb 1.7 oz (0.502 kg)  Wt Readings from Last 3 Encounters:  11/15/15 179 lb 14.4 oz (81.602 kg)  10/30/15 205 lb 1.6 oz (93.033 kg)  10/28/15 206 lb (93.441 kg)    Lungs are clear Regular rhythm. S4 gallop audible. No edema is noted peripherally.  ASSESSMENT:  1. Chronic combined systolic and diastolic heart failure. Patient appears euvolemic. 2. Chronic kidney disease with recent increasing azotemia due to diuresis. -4.5 L since admission 3. Hypoxemia, and abnormal CT raising question of amiodarone toxicity. It is difficult to know if there is an toxicity.  We are left with no effective agent for atrial fibrillation management. Tikosyn would be a consideration if A. fib recurs.   Plan:  1. Resume maintenance diuretic regimen  2. Sedimentation rate which is usually elevated in pulmonary toxicity related to amiodarone  Signed, Sinclair Grooms 11/15/2015, 11:36 AM

## 2015-11-15 NOTE — Progress Notes (Signed)
[facility] PROGRESS NOTE  Walter French QIW:[id number] DOB: Apr 02, 1946 DOA: 11/07/2015 PCP: Walter Einstein, MD  Brief Summary  68 y/o male admitted 10/05/15 with Afib RVR CHad2Vasc2 score=5, Failed DCCV and intolerant BB and therefore on Amio and digoxin, CAd s/p CABG x3, chronic systolic heart failure with EF 30-35% 07/2015, H/o VT s/p [employer] ICD, adenoCa [place] 07/2015 s/p XRT and Taxol/carbioplain completing Rx 09/2015.  Admitted due to hypoglycemia due to poor PO intake despite being taken off his insulin and started on glucotrol.    Review of chart indicates poor compliance in the past.  Declines SNF.    Assessment/Plan  Acute hypoxic respiratory failure due to acute on chronic systolic heart failure, EF 30-35% and possible atypical pneumonia or amiodarone pulmonary toxicity.   Received 7-days of XRT 2 months ago with carboplatin and paclitaxel.  XRT may have effected lung, but chemotherapeutic agents are not typical causes of pneumonitis.   -  Amiodarone discontinued on 12/3 -  Legionella urine antigen pending -  Started doxycycline 12/3 -  If no improvement after 2-3 days, will start steroids -  ESR per cardiology  Atrial fibrillation s/p successful DCCV on 11/09/2015 -  D/c amiodarone due to possibly amiodarone-induced pulmonary toxicity -  Tele:  NSR -  CHad2Vasc2 score= 5 -  Continue apixaban -  Per cardiology, may need Tikosyn if a-fib returns  Abdominal pain likely secondary to constipation and resolved with bowel regimen  Hypoglycemia due to malignancy associated weight loss  -  A1c 5.6 -  Continue supplements   Hypothyroidism, stable, continue levothyroxine 93mg daily and repeat TSH in late December  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  Diet:  Low sodium  Access:  PIV IVF:  off Proph:  apixaban  Code Status: full Family Communication: patient and Walter French POA Disposition Plan:  To SNF when medically ready.     Consultants:    Cardiology  Diabetic educator  Procedures:  DCCV on 11/28  CT chest on 12/3  Antibiotics:  none   HPI/Subjective:  Feels better today, more energy and less SOB.  Concerned about CT findings    Objective: Filed Vitals:   11/14/15 1114 11/14/15 2041 11/15/15 0546 11/15/15 1304  BP: 104/58 120/65 116/61 107/61  Pulse: 80 84 79 67  Temp: 97.9 F (36.6 C) 97.8 F (36.6 C) 97.9 F (36.6 C) 97.4 F (36.3 C)  TempSrc: Oral Oral Oral Oral  Resp: 18 18 18 20   Height:      Weight:   81.602 kg (179 lb 14.4 oz)   SpO2: 97% 97% 99% 92%    Intake/Output Summary (Last 24 hours) at 11/15/15 1648 Last data filed at 11/15/15 1400  Gross per 24 hour  Intake    880 ml  Output   1050 ml  Net   -170 ml   Filed Weights   11/13/15 0605 11/14/15 0420 11/15/15 0546  Weight: 82.283 kg (181 lb 6.4 oz) 81.1 kg (178 lb 12.7 oz) 81.602 kg (179 lb 14.4 oz)   Body mass index is 24.39 kg/(m^2).  Exam:   General:  Adult male, still winded resting in bed on 3L [place]  HEENT:  NCAT, MMM  Cardiovascular:  RRR, nl S1, S2 no mrg, 2+ pulses, warm extremities  Respiratory:   Diminished bilateral breath sounds, no wheezes or rhonchi or rales today  Abdomen:   NABS, soft, NT/ND  MSK:   Normal tone and bulk, no LEE  Neuro:  Diffusely weak  Data Reviewed: Basic Metabolic Panel:  Recent Labs Lab 11/10/15 0446 11/11/15 0824 11/12/15 0344 11/13/15 0526 11/14/15 0233 11/15/15 0309  NA 132* 132* 131* 129* 130* 130*  K 3.4* 3.3* 2.9* 3.6 3.3* 3.8  CL 93* 92* 88* 87* 87* 88*  CO2 29 32 33* 32 32 32  GLUCOSE 150* 131* 138* 147* 158* 158*  BUN 16 19 22* 33* 43* 43*   CREATININE 1.17 1.26* 1.25* 1.30* 1.45* 1.43*  CALCIUM 8.6* 9.0 8.9 9.2 9.3 9.2  MG 2.1  --  2.0  --   --   --   PHOS  --   --  4.3  --   --   --    Liver Function Tests:  Recent Labs Lab 11/09/15 0130 11/12/15 0344  AST 45* 49*  ALT 19 21  ALKPHOS 345* 427*  BILITOT 1.4* 1.1  PROT 6.7 7.3  ALBUMIN 2.0* 2.1*    Recent Labs Lab 11/12/15 0344  LIPASE 47   No results for input(s): AMMONIA in the last 168 hours. CBC:  Recent Labs Lab 11/11/15 0824 11/12/15 0344 11/13/15 0526 11/14/15 0233 11/15/15 0309  WBC 6.0 6.5 6.8 8.6 8.4  HGB 10.0* 10.5* 11.0* 11.1* 11.0*  HCT 30.7* 32.3* 33.6* 35.1* 34.1*  MCV 86.0 85.4 84.4 85.2 84.6  PLT 168 176 171 204 185    Recent Results (from the past 240 hour(s))  MRSA PCR Screening     Status: None   Collection Time: 11/08/15  1:11 AM  Result Value Ref Range Status   MRSA by PCR NEGATIVE NEGATIVE Final    Comment:        The GeneXpert MRSA Assay (FDA approved for NASAL specimens only), is one component of a comprehensive MRSA colonization surveillance program. It is not intended to diagnose MRSA infection nor to guide or monitor treatment for MRSA infections.      Studies: Ct Chest Wo Contrast  11/14/2015  CLINICAL DATA:  Dyspnea.  Adenocarcinoma of the esophagus EXAM: CT CHEST WITHOUT CONTRAST TECHNIQUE: Multidetector CT imaging of the chest was performed following the standard protocol without IV contrast. COMPARISON:  09/10/2015 FINDINGS: THORACIC INLET/BODY WALL: Single chamber ICD/pacer from the left with lead in the right ventricle. MEDIASTINUM: There is chronic cardiomegaly. No pericardial effusion. Extensive coronary atherosclerosis, status post CABG. Thickening of the distal esophagus correlating with history of esophageal cancer. Lower periesophageal nodes are decreased in size. LUNG WINDOWS: There is asymmetric patchy ground-glass opacity with interstitial coarsening in the right more than left lungs. Subtle but  convincing beaded appearance of right lower lobe bronchi consistent with a fibrotic process. No macroscopic honeycombing. These changes have developed since 09/10/2015. No diffuse interlobular septal thickening typical of edema. No effusion. No dense consolidation. UPPER ABDOMEN: Bilateral renal calcifications appear atherosclerotic. Cystic change noted in the left kidney. OSSEOUS: No acute fracture.  No suspicious lytic or blastic lesions. IMPRESSION: 1. Interstitial and ground-glass airspace opacities in the right more than left lungs. Often this is from asymmetric/resolving edema or atypical infection, but in this case there is beaded appearance of right lower lobe bronchi suggesting a fibrotic process and inflammatory pneumonia. Question drug toxicity (especially from the patient's amiodarone use). 2. Treated esophageal  cancer with stable lower esophageal thickening. Periesophageal nodal size is decreased from September 2016. Electronically Signed   By: Monte Fantasia M.D.   On: 11/14/2015 15:37    Scheduled Meds: . allopurinol  300 mg Oral Daily  . antiseptic oral rinse  7 mL Mouth Rinse BID  . apixaban  5 mg Oral BID  . atorvastatin  40 mg Oral Daily  . carvedilol  6.25 mg Oral BID WC  . digoxin  0.125 mg Oral Daily  . docusate sodium  100 mg Oral BID  . doxycycline  100 mg Oral Q12H  . famotidine  20 mg Oral Daily  . feeding supplement (ENSURE ENLIVE)  237 mL Oral BID BM  . levothyroxine  75 mcg Oral QAC breakfast  . polyethylene glycol  17 g Oral BID  . potassium chloride  30 mEq Oral BID  . saccharomyces boulardii  250 mg Oral BID  . senna  2 tablet Oral QHS   Continuous Infusions:   Active Problems:   Hyperlipidemia   Coronary artery disease-status post CABG   Automatic implantable cardioverter-defibrillator in situ   Systolic and diastolic CHF, chronic (HCC)   Gastroesophageal cancer (HCC)   PAF- s/p DCCV this admission   Anemia of chronic disease   Hypothyroidism   Acute  on chronic combined systolic (congestive) and diastolic (congestive) heart failure (HCC)   Chronic anticoagulation - Eliquis, CHADS2VASC=5   Hypotension   Cardiomyopathy, ischemic-30-35%   Malnutrition of moderate degree   Constipation   Adenocarcinoma of esophagus ([place])    Time spent: 30 min    Elexis Pollak, [place]  [facility] Pager [phone]. If 7PM-7AM, please contact night-coverage at [email or web address], password [facility] 11/15/2015, 4:48 PM  LOS: 8 days

## 2015-11-16 ENCOUNTER — Ambulatory Visit: Payer: Medicare Other | Admitting: Cardiology

## 2015-11-16 ENCOUNTER — Encounter: Payer: Self-pay | Admitting: Cardiology

## 2015-11-16 DIAGNOSIS — J984 Other disorders of lung: Secondary | ICD-10-CM

## 2015-11-16 DIAGNOSIS — T462X5A Adverse effect of other antidysrhythmic drugs, initial encounter: Secondary | ICD-10-CM

## 2015-11-16 LAB — GLUCOSE, CAPILLARY
GLUCOSE-CAPILLARY: 153 mg/dL — AB (ref 65–99)
Glucose-Capillary: 130 mg/dL — ABNORMAL HIGH (ref 65–99)
Glucose-Capillary: 184 mg/dL — ABNORMAL HIGH (ref 65–99)

## 2015-11-16 LAB — BASIC METABOLIC PANEL
Anion gap: 8 (ref 5–15)
BUN: 32 mg/dL — AB (ref 6–20)
CALCIUM: 9.1 mg/dL (ref 8.9–10.3)
CHLORIDE: 89 mmol/L — AB (ref 101–111)
CO2: 32 mmol/L (ref 22–32)
CREATININE: 1.16 mg/dL (ref 0.61–1.24)
GFR calc Af Amer: >60 mL/min (ref >60)
GFR calc non Af Amer: >60 mL/min (ref >60)
Glucose, Bld: 126 mg/dL — ABNORMAL HIGH (ref 65–99)
Potassium: 3.9 mmol/L (ref 3.5–5.1)
SODIUM: 129 mmol/L — AB (ref 135–145)

## 2015-11-16 LAB — CBC
HCT: 34.1 % — ABNORMAL LOW (ref 39.0–52.0)
Hemoglobin: 10.9 g/dL — ABNORMAL LOW (ref 13.0–17.0)
MCH: 27.3 pg (ref 26.0–34.0)
MCHC: 32 g/dL (ref 30.0–36.0)
MCV: 85.5 fL (ref 78.0–100.0)
PLATELETS: 168 10*3/uL (ref 150–400)
RBC: 3.99 MIL/uL — ABNORMAL LOW (ref 4.22–5.81)
RDW: 16.4 % — AB (ref 11.5–15.5)
WBC: 7.2 10*3/uL (ref 4.0–10.5)

## 2015-11-16 LAB — SEDIMENTATION RATE: Sed Rate: 125 mm/hr — ABNORMAL HIGH (ref 0–16)

## 2015-11-16 MED ORDER — FUROSEMIDE 40 MG PO TABS
40.0000 mg | ORAL_TABLET | Freq: Every day | ORAL | Status: DC
  Administered 2015-11-16: 40 mg via ORAL
  Filled 2015-11-16: qty 1

## 2015-11-16 MED ORDER — POLYETHYLENE GLYCOL 3350 17 GM/SCOOP PO POWD: 17.0000 g | Freq: Two times a day (BID) | ORAL | Status: DC

## 2015-11-16 MED ORDER — DOXYCYCLINE HYCLATE 100 MG PO TABS: 100.0000 mg | ORAL_TABLET | Freq: Two times a day (BID) | ORAL | Status: DC

## 2015-11-16 MED ORDER — LORAZEPAM 0.5 MG PO TABS: 0.5000 mg | ORAL_TABLET | Freq: Three times a day (TID) | ORAL | Status: DC

## 2015-11-16 MED ORDER — FUROSEMIDE 40 MG PO TABS
40.0000 mg | ORAL_TABLET | Freq: Every day | ORAL | Status: DC
Start: 2015-11-16 — End: 2016-03-03

## 2015-11-16 MED ORDER — CARVEDILOL 6.25 MG PO TABS
6.2500 mg | ORAL_TABLET | Freq: Two times a day (BID) | ORAL | Status: DC
Start: 2015-11-16 — End: 2015-12-12

## 2015-11-16 NOTE — Care Management Important Message (Signed)
Important Message  Patient Details  Name: YASER PAAVOLA MRN: [id number] Date of Birth: January 27, 1946   Medicare Important Message Given:  Yes    Sherrie Marsan P Tatum Massman 11/16/2015, 11:47 AM

## 2015-11-16 NOTE — Progress Notes (Signed)
[facility]unez arranged with [facility] as requested and home 02 ordered and to be delivered to the room prior to discharging home. Mindi Slicker [facility] [phone]

## 2015-11-16 NOTE — Progress Notes (Signed)
Pt discharged to home via wheelchair, home O2 with patient, questions answered.

## 2015-11-16 NOTE — Progress Notes (Signed)
Patient Name: Walter French Date of Encounter: 11/16/2015    SUBJECTIVE: Breathing has improved. I's/O's about even. Total over 4L negative. Amiodarone has been discontinued due to concern about possible toxicity - sed rate 125. Remains in sinus rhythm at the moment. Plans to walk with therapy today.   TELEMETRY:  Normal sinus rhythm on monitor. Filed Vitals:   11/16/15 0434 11/16/15 0855 11/16/15 0913 11/16/15 0930  BP: 109/67  105/57   Pulse: 80  82   Temp: 97.6 F (36.4 C)     TempSrc: Oral     Resp: 18  22   Height:      Weight: 179 lb 13.9 oz (81.589 kg)     SpO2: 96% 94% 100% 97%    Intake/Output Summary (Last 24 hours) at 11/16/15 1032 Last data filed at 11/16/15 0854  Gross per 24 hour  Intake   1182 ml  Output    975 ml  Net    207 ml   LABS: Basic Metabolic Panel:  Recent Labs  11/15/15 0309 11/16/15 0402  NA 130* 129*  K 3.8 3.9  CL 88* 89*  CO2 32 32  GLUCOSE 158* 126*  BUN 43* 32*  CREATININE 1.43* 1.16  CALCIUM 9.2 9.1   CBC:  Recent Labs  11/15/15 0309 11/16/15 0402  WBC 8.4 7.2  HGB 11.0* 10.9*  HCT 34.1* 34.1*  MCV 84.6 85.5  PLT 185 168    Radiology/Studies:  Reviewed CT scan reports  Physical Exam: Blood pressure 105/57, pulse 82, temperature 97.6 F (36.4 C), temperature source Oral, resp. rate 22, height 6' (1.829 m), weight 179 lb 13.9 oz (81.589 kg), SpO2 97 %. Weight change: -0.5 oz (-0.013 kg)  Wt Readings from Last 3 Encounters:  11/16/15 179 lb 13.9 oz (81.589 kg)  10/30/15 205 lb 1.6 oz (93.033 kg)  10/28/15 206 lb (93.441 kg)   Awake, NAD Faint basilar crackles Regular rhythm. S1/s2, no M/R/G's No edema is noted peripherally.  ASSESSMENT:  1. Chronic combined systolic and diastolic heart failure - EF 30-35%.  2. Chronic kidney disease with recent increasing azotemia due to diuresis. -4.5 L since admission 3. Hypoxemia, and abnormal CT raising question of amiodarone toxicity.  Plan:  1. Patient  appears euvolemic. I's/O's are even. Would suggest restarting maintenance dose lasix. 2. Elevated ESR at 125 - suspect amiodarone toxicity, although he has underlying malignancy which can effect this. Will list amiodarone in intolerances. If he has recurrent a-fib, will need to consider Tikosyn.  Pixie Casino, MD, [facility] Attending Cardiologist [place] 11/16/2015, 10:32 AM

## 2015-11-16 NOTE — Discharge Summary (Addendum)
Physician Discharge Summary  Walter French IRJ:[id number] DOB: 12-01-1946 DOA: 11/07/2015  PCP: Walter Einstein, MD  Admit date: 11/07/2015 Discharge date: 11/16/2015  Recommendations for Outpatient Follow-up:  1. Discharge to home with home health services, PT/OT/RN 2. F/u in one week in the heart failure clinic.  Repeat BMP at next visit 3. F/u with pulmonology ASAP for new patient appointment regarding amiodarone toxicity 4. 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[place])   PAF- s/p DCCV this admission   Anemia of chronic disease   Hypothyroidism   Acute on chronic combined systolic (congestive) and diastolic (congestive) heart failure (HCC)   Chronic anticoagulation - Eliquis, CHADS2VASC=5   Hypotension   Cardiomyopathy, ischemic-30-35%   Malnutrition of moderate degree   Constipation   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  He was admitted due to hypoglycemia due to poor PO intake despite being taken off his insulin and being started on glucotrol.  Hospital Course:   Acute hypoxic respiratory failure due to acute on  chronic systolic heart failure, EF 30-35% possible atypical pneumonia and amiodarone pulmonary toxicity. He received 7-days of XRT 2 months ago with carboplatin and paclitaxel, however, although XRT may have effected lung, the chemotherapeutic agents are not typical causes of pneumonitis. He was diuresed with Lasix 40 mg IV twice a day and his weight decreased from 86.6 kg to 81.6 kg on the date of discharge.  Despite diuresis, he continued to have a persistent high oxygen requirement of 4-5 L. A CT of the chest demonstrated changes consistent with either atypical pneumonia or amiodarone toxicity.  He was started on doxycycline for atypical pneumonia on 12/3 and had a small improvement in his breathing over the subsequent 2 days.  Despite his improvement on doxycycline, I suspect that he likely has amiodarone toxicity. His ESR was 125.  He was not started on steroids secondary to his acute on chronic systolic heart failure.  He required 3 L of oxygen at the time of discharge.  Atrial fibrillation s/p successful DCCV on 11/09/2015.  He was continued on Apixaban due to Mascot score= 5, however his amiodarone was discontinued secondary to pulmonary toxicity.  Cardiology recommended Tikosyn should his tachycardia recur.    Abdominal pain likely secondary to constipation and resolved with bowel regimen  Hypoglycemia due to malignancy associated weight loss.  His hemoglobin A1c was 5.6 he met with the nutritionist and was encouraged to use supplements.  His cortisol level was 22 wnl.  TSH was 0.588 wnl.  Hypothyroidism, stable, continue levothyroxine 52mg daily and repeat TSH in late December.    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  Completed treatment 09/2015 with Taxol/carboplatin and XRT and  CT scan demonstrated improvement in area at GE junction during this hospitalization.    HLD, stable, continue atorvastatin 49m daily  Gout stable, continue allopurinol 3042m daily  Hypokalemia due to lasix.  Continued daily potassium  Hyponatremia due to heart failure, stable.  Generalized weakness.  PT/OT recommending SNF, however, patient declined and requested to go home.  He will go home with 24 hour supervision and home health services.    Consultants:   Cardiology  Diabetic educator  Procedures:  DCCV on 11/28  CT chest on 12/3  Antibiotics:  none  Discharge Exam: Filed Vitals:   11/16/15 0913 11/16/15 1118  BP: 105/57 111/66  Pulse: 82 79  Temp: 96.4 F (35.8 C)   Resp: 22 20   Filed Vitals:   11/16/15 0855 11/16/15 0913 11/16/15 0930 11/16/15 1118  BP:  105/57  111/66  Pulse:  82  79  Temp:  96.4 F (35.8 C)    TempSrc:  Axillary    Resp:  22  20  Height:      Weight:      SpO2: 94% 100% 97% 100%     General: Adult male, mild tachypnea on 3L [place]  HEENT: NCAT, MMM  Cardiovascular: RRR, nl S1, S2 no mrg, 2+ pulses, warm extremities  Respiratory: Diminished bilateral breath sounds, no wheezes or rhonchi or rales today  Abdomen: NABS, soft, NT/ND  MSK: Normal tone and bulk, no LEE  Neuro: Diffusely weak  Discharge Instructions      Discharge Instructions    (HEART FAILURE PATIENTS) Call MD:  Anytime you have any of the following symptoms: 1) 3 pound weight gain in 24 hours or 5 pounds in 1 week 2) shortness of breath, with or without a dry hacking cough 3) swelling in the hands, feet or stomach 4) if you have to sleep on extra pillows at night in order to breathe.    Complete by:  As directed      Call MD for:  difficulty breathing, headache or visual disturbances    Complete by:  As directed      Call MD for:  extreme fatigue    Complete by:  As directed      Call MD for:  hives    Complete by:  As directed      Call MD for:  persistant dizziness or light-headedness    Complete by:  As directed      Call MD for:  persistant nausea and vomiting    Complete by:  As directed      Call MD for:   severe uncontrolled pain    Complete by:  As directed      Call MD for:  temperature >100.4    Complete by:  As directed      Diet - low sodium heart healthy    Complete by:  As directed      Diet - low sodium heart healthy    Complete by:  As directed      Discharge instructions    Complete by:  As directed   Do not take high doses of glipizide for blood sugar control-I have cut back the dose drastically-recheck your blood sugars regularly and follow with your primary MD Continue new medications as per chart Notice that some medicine doses and forms as well as frequencies have changed Check your weight-you did have some heart failure in the hospital and will need to take an extra dose in addition to twice  daily lasix if you gain mor ethan 2 lbs in a 24 hr period of time     Discharge instructions    Complete by:  As directed   For your breathing, please use 3L home oxygen at all times.  You may have a gradual improvement in your breathing over the next few weeks.  Please take the antibiotic doxycycline twice a day for the next four days.  If you have worsening shortness of breath, please return to the hospital right away.  Please schedule an appointment to see a pulmonologist or lung specialist.  You will need to go back to cardiology in 1 week for reevaluation and blood work.  Check your weight every day and if you gain more than 3-lbs in one day or 5-lbs in one week, please call the cardiology office right away for further instructions.  Several of your medications have changed.  Please STOP your glipizide and your amiodarone.  The cardiologists have changed your metoprolol to carvedilol.  Finally, your lasix dose has been adjusted to once daily.     Increase activity slowly    Complete by:  As directed      Increase activity slowly    Complete by:  As directed             Medication List    STOP taking these medications        amiodarone 400 MG tablet  Commonly known as:  PACERONE      glipiZIDE 10 MG tablet  Commonly known as:  GLUCOTROL     metoprolol succinate 50 MG 24 hr tablet  Commonly known as:  TOPROL-XL      TAKE these medications        acetaminophen 500 MG tablet  Commonly known as:  TYLENOL  Take 1,000 mg by mouth every 6 (six) hours as needed for moderate pain or headache.     allopurinol 300 MG tablet  Commonly known as:  ZYLOPRIM  Take 300 mg by mouth daily.     apixaban 5 MG Tabs tablet  Commonly known as:  ELIQUIS  Take 1 tablet (5 mg total) by mouth 2 (two) times daily.     atorvastatin 40 MG tablet  Commonly known as:  LIPITOR  Take 1 tablet (40 mg total) by mouth daily.     carvedilol 6.25 MG tablet  Commonly known as:  COREG  Take 1 tablet (6.25 mg total) by mouth 2 (two) times daily with a meal.     digoxin 0.125 MG tablet  Commonly known as:  LANOXIN  Take 1 tablet (0.125 mg total) by mouth daily.     doxycycline 100 MG tablet  Commonly known as:  VIBRA-TABS  Take 1 tablet (100 mg total) by mouth every 12 (twelve) hours.     furosemide 40 MG tablet  Commonly known as:  LASIX  Take 1 tablet (40 mg total) by mouth daily.     levothyroxine 75 MCG tablet  Commonly known as:  SYNTHROID, LEVOTHROID  Take 75 mcg by mouth daily before breakfast.     LORazepam 0.5 MG tablet  Commonly known as:  ATIVAN  Take 1 tablet (0.5 mg total) by mouth every 8 (eight) hours.     nitroGLYCERIN 0.4 MG SL tablet  Commonly known as:  NITROSTAT  Place 1 tablet (0.4 mg total) under the tongue every 5 (five) minutes x 3 doses as needed for chest pain.     polyethylene glycol powder powder  Commonly known as:  MIRALAX  Take 17 g by mouth 2 (two) times daily.     potassium chloride SA 20 MEQ tablet  Commonly known as:  K-DUR,KLOR-CON  Take 1 tablet (20 mEq total) by mouth daily.     ranitidine 300 MG tablet  Commonly known as:  ZANTAC  Take 1 tablet (300 mg total) by mouth daily as needed for heartburn.       Follow-up Information     Follow up with [facility].   Specialty:  Emergency Medicine   Why:  If symptoms worsen   Contact information:   [address] [id number] [place] [phone]      Follow up with [facility].   Why:  They will do your home health care at your home and deliver your oxygen   Contact information:   [address] [place] [address] [phone]       Follow up with [facility]. Schedule an appointment as soon as possible for a visit in 1 month.   Specialty:  Pulmonology   Contact information:   [place] [phone]      Follow up with [facility]. Schedule an appointment as soon as possible for a visit in 1 week.   Specialty:  Cardiology   Contact information:   [address] [place] [address] [phone]       The results of significant diagnostics from this hospitalization (including imaging, microbiology, ancillary and laboratory) are listed below for reference.    Significant Diagnostic Studies: Dg Chest 2 View  11/11/2015  CLINICAL DATA:  Increased shortness of breath over the past 2 days, generalize weakness; history of atrial fibrillation, CHF, previous CABG, and cardiac dysrhythmias. EXAM: CHEST  2 VIEW COMPARISON:  Portable chest x-ray of November 09, 2015 FINDINGS: The lungs are reasonably well inflated. The interstitial markings remain increased bilaterally. There is no significant pleural effusion. The cardiac silhouette remains enlarged. The central pulmonary vascularity remains engorged. A permanent pacemaker defibrillator is present and stable. The patient has undergone previous median sternotomy. There are multiple broken sternal wires. The bony thorax exhibits no acute abnormality. Cardiac monitoring electrodes overlie the upper abdomen. There is a  structure that could reflect a venous catheter or mediastinal drain in the midline though I favor this reflecting a electrode. Correlation clinically in this regard is needed. IMPRESSION: CHF with mild interstitial edema. There is no alveolar pneumonia nor other acute cardiopulmonary abnormality. Electronically Signed   By: David  Martinique M.D.   On: 11/11/2015 07:46   Dg Chest 2 View  11/08/2015  CLINICAL DATA:  Shortness of breath. Pt in afib and desatting during the exam. EXAM: CHEST  2 VIEW COMPARISON:  10/05/2015 FINDINGS: Stable cardiac enlargement and cardiac pacer. Low lung volumes. Severe bilateral interstitial change. Retrocardiac consolidation. IMPRESSION: Findings suggest congestive heart failure with severe interstitial edema Electronically Signed   By: Skipper Cliche M.D.   On: 11/08/2015 14:12   Ct Chest Wo Contrast  11/14/2015  CLINICAL DATA:  Dyspnea.  Adenocarcinoma of the esophagus EXAM: CT CHEST WITHOUT CONTRAST TECHNIQUE: Multidetector CT imaging of the chest was performed following the standard protocol without IV contrast. COMPARISON:  09/10/2015 FINDINGS: THORACIC INLET/BODY WALL: Single chamber ICD/pacer from the left with lead in the right ventricle. MEDIASTINUM: There is chronic cardiomegaly. No pericardial effusion. Extensive coronary atherosclerosis, status post  CABG. Thickening of the distal esophagus correlating with history of esophageal cancer. Lower periesophageal nodes are decreased in size. LUNG WINDOWS: There is asymmetric patchy ground-glass opacity with interstitial coarsening in the right more than left lungs. Subtle but convincing beaded appearance of right lower lobe bronchi consistent with a fibrotic process. No macroscopic honeycombing. These changes have developed since 09/10/2015. No diffuse interlobular septal thickening typical of edema. No effusion. No dense consolidation. UPPER ABDOMEN: Bilateral renal calcifications appear atherosclerotic. Cystic change noted  in the left kidney. OSSEOUS: No acute fracture.  No suspicious lytic or blastic lesions. IMPRESSION: 1. Interstitial and ground-glass airspace opacities in the right more than left lungs. Often this is from asymmetric/resolving edema or atypical infection, but in this case there is beaded appearance of right lower lobe bronchi suggesting a fibrotic process and inflammatory pneumonia. Question drug toxicity (especially from the patient's amiodarone use). 2. Treated esophageal cancer with stable lower esophageal thickening. Periesophageal nodal size is decreased from September 2016. Electronically Signed   By: Monte Fantasia M.D.   On: 11/14/2015 15:37   Dg Chest Port 1 View  11/13/2015  CLINICAL DATA:  CHF EXAM: PORTABLE CHEST 1 VIEW COMPARISON:  11/11/2015 FINDINGS: Interval improvement in interstitial edema. Vascular congestion and mild edema remains. Mild bibasilar atelectasis. Small left effusion IMPRESSION: Interval improvement in interstitial edema. Electronically Signed   By: Franchot Gallo M.D.   On: 11/13/2015 09:04   Dg Chest Port 1 View  11/09/2015  CLINICAL DATA:  Congestive heart failure. EXAM: PORTABLE CHEST 1 VIEW COMPARISON:  11/08/2015 at 13:30 FINDINGS: There is partial clearance of vascular and interstitial congestive changes. There continues to be mild ground-glass opacity in the medial base regions. Probable small left pleural effusion. Grossly intact appearances of the transvenous cardiac lead. IMPRESSION: Slight improvement with partial clearance of congestive changes compared to the earlier study Electronically Signed   By: Andreas [place] M.D.   On: 11/09/2015 01:52   Dg Abd Portable 1v  11/12/2015  CLINICAL DATA:  Patient with mid abdominal pain. EXAM: PORTABLE ABDOMEN - 1 VIEW COMPARISON:  PET-CT 08/07/2015; CT abdomen 07/23/2015. FINDINGS: Stool is present throughout the colon. Gas is demonstrated within nondilated loops of large and small bowel in a nonobstructed pattern.  Regional skeleton unremarkable. IMPRESSION: No bowel obstruction. Stool within the rectum and ascending colon. Electronically Signed   By: Lovey Newcomer M.D.   On: 11/12/2015 11:01    Microbiology: Recent Results (from the past 240 hour(s))  MRSA PCR Screening     Status: None   Collection Time: 11/08/15  1:11 AM  Result Value Ref Range Status   MRSA by PCR NEGATIVE NEGATIVE Final    Comment:        The GeneXpert MRSA Assay (FDA approved for NASAL specimens only), is one component of a comprehensive MRSA colonization surveillance program. It is not intended to diagnose MRSA infection nor to guide or monitor treatment for MRSA infections.      Labs: Basic Metabolic Panel:  Recent Labs Lab 11/10/15 0446  11/12/15 0344 11/13/15 0526 11/14/15 0233 11/15/15 0309 11/16/15 0402  NA 132*  < > 131* 129* 130* 130* 129*  K 3.4*  < > 2.9* 3.6 3.3* 3.8 3.9  CL 93*  < > 88* 87* 87* 88* 89*  CO2 29  < > 33* 32 32 32 32  GLUCOSE 150*  < > 138* 147* 158* 158* 126*  BUN 16  < > 22* 33* 43* 43* 32*  CREATININE 1.17  < >  1.25* 1.30* 1.45* 1.43* 1.16  CALCIUM 8.6*  < > 8.9 9.2 9.3 9.2 9.1  MG 2.1  --  2.0  --   --   --   --   PHOS  --   --  4.3  --   --   --   --   < > = values in this interval not displayed. Liver Function Tests:  Recent Labs Lab 11/12/15 0344  AST 49*  ALT 21  ALKPHOS 427*  BILITOT 1.1  PROT 7.3  ALBUMIN 2.1*    Recent Labs Lab 11/12/15 0344  LIPASE 47   No results for input(s): AMMONIA in the last 168 hours. CBC:  Recent Labs Lab 11/12/15 0344 11/13/15 0526 11/14/15 0233 11/15/15 0309 11/16/15 0402  WBC 6.5 6.8 8.6 8.4 7.2  HGB 10.5* 11.0* 11.1* 11.0* 10.9*  HCT 32.3* 33.6* 35.1* 34.1* 34.1*  MCV 85.4 84.4 85.2 84.6 85.5  PLT 176 171 204 185 168   Cardiac Enzymes: No results for input(s): CKTOTAL, CKMB, CKMBINDEX, TROPONINI in the last 168 hours. BNP: BNP (last 3 results)  Recent Labs  09/30/15 2156 10/07/15 0258 11/09/15 0130  BNP  704.3* 999.3* 723.3*    ProBNP (last 3 results) No results for input(s): PROBNP in the last 8760 hours.  CBG:  Recent Labs Lab 11/15/15 1556 11/15/15 1954 11/16/15 0553 11/16/15 1105 11/16/15 1612  GLUCAP 182* 156* 130* 184* 153*    Time coordinating discharge: 35 minutes  Signed:  Yashvi Jasinski  [facility] 11/16/2015, 6:31 PM

## 2015-11-16 NOTE — Progress Notes (Signed)
SATURATION QUALIFICATIONS: (This note is used to comply with regulatory documentation for home oxygen)  Patient Saturations on Room Air at Rest = 93%  Patient Saturations on Room Air while Ambulating = 75%  Patient Saturations on 3 Liters of oxygen while Ambulating = 92%  Please briefly explain why patient needs home oxygen: pt needs o2 at home

## 2015-11-20 ENCOUNTER — Encounter: Payer: Self-pay | Admitting: Internal Medicine

## 2015-11-20 ENCOUNTER — Ambulatory Visit (INDEPENDENT_AMBULATORY_CARE_PROVIDER_SITE_OTHER): Payer: Medicare Other | Admitting: Internal Medicine

## 2015-11-20 VITALS — BP 100/58 | HR 81 | Ht 72.0 in | Wt 185.0 lb

## 2015-11-20 DIAGNOSIS — I255 Ischemic cardiomyopathy: Secondary | ICD-10-CM | POA: Diagnosis not present

## 2015-11-20 DIAGNOSIS — J9612 Chronic respiratory failure with hypercapnia: Secondary | ICD-10-CM

## 2015-11-20 DIAGNOSIS — J984 Other disorders of lung: Secondary | ICD-10-CM

## 2015-11-20 DIAGNOSIS — T462X5A Adverse effect of other antidysrhythmic drugs, initial encounter: Secondary | ICD-10-CM | POA: Diagnosis not present

## 2015-11-20 DIAGNOSIS — J9611 Chronic respiratory failure with hypoxia: Secondary | ICD-10-CM

## 2015-11-20 NOTE — Progress Notes (Signed)
Subjective:    Patient ID: Walter French, male    DOB: 10/15/1946,    MRN: [id number]  HPI  78 yowm quit smoking in 1997 p cabg did better p rehab x a few years and then graduallydecline fxn level due to sob ? chf  then much worse since  dx es ca  chemo / RT but no 02 until admit hosp with acute worsening sob with resp failure:   Admit date: 11/07/2015 Discharge date: 11/16/2015  Recommendations for Outpatient Follow-up:  1. Discharge to home with home health services, PT/OT/RN 2. F/u in one week in the heart failure clinic. Repeat BMP at next visit 3. F/u with pulmonology ASAP for new patient appointment regarding amiodarone toxicity 4. 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[place]) Active Problems:  Hyperlipidemia  Coronary artery disease-status post CABG  Automatic implantable cardioverter-defibrillator in situ  Systolic and diastolic CHF, chronic (HCC)  Gastroesophageal cancer ([place])  PAF- s/p DCCV this admission  Anemia of chronic disease  Hypothyroidism  Acute on chronic combined systolic (congestive) and diastolic (congestive) heart failure (HCC)  Chronic anticoagulation - Eliquis, CHADS2VASC=5  Hypotension  Cardiomyopathy, ischemic-30-35%  Malnutrition of moderate degree  Constipation  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 He was admitted due to hypoglycemia due to poor PO intake despite being taken off his insulin and being started on glucotrol.  Hospital Course:   Acute hypoxic respiratory failure due to acute on chronic systolic heart failure, EF 30-35% possible atypical pneumonia and amiodarone pulmonary toxicity. He received 7-days of XRT 2 months ago with carboplatin and paclitaxel, however, although XRT may have effected lung, the chemotherapeutic agents are not typical causes of pneumonitis. He was diuresed with Lasix 40 mg IV twice a day and his weight decreased from 86.6 kg to 81.6 kg on the date of discharge. Despite diuresis, he continued to have a persistent high oxygen requirement of 4-5 L. A CT of the chest demonstrated changes consistent with either atypical pneumonia or amiodarone toxicity. He was started on doxycycline for atypical pneumonia on 12/3 and had a small improvement in his breathing over the subsequent 2 days. Despite his improvement on doxycycline, I suspect that he likely has amiodarone toxicity. His ESR was 125. He was not started on steroids secondary to his acute on chronic systolic heart failure. He required 3 L of oxygen at the time of discharge.  Atrial fibrillation s/p successful DCCV on 11/09/2015. He was continued on Apixaban due to [place] score= 5, however his amiodarone was discontinued secondary to pulmonary toxicity. Cardiology recommended Tikosyn should his tachycardia recur.   Abdominal pain likely secondary to constipation and resolved with bowel regimen  Hypoglycemia due to malignancy associated weight loss. His hemoglobin A1c was 5.6 he met with the nutritionist and was encouraged to use supplements. His cortisol level was 22 wnl. TSH was 0.588 wnl.  Hypothyroidism, stable,  continue levothyroxine 80mg daily and repeat TSH in late December.   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 Completed  treatment 09/2015 with Taxol/carboplatin and XRT and CT scan demonstrated improvement in area at GE junction during this hospitalization.   HLD, stable, continue atorvastatin 42mdaily  Gout stable, continue allopurinol 30074maily  Hypokalemia due to lasix. Continued daily potassium  Hyponatremia due to heart failure, stable.  Generalized weakness. PT/OT recommending SNF, however, patient declined and requested to go home. He will go home with 24 hour supervision and home health services.   Consultants:   Cardiology  Diabetic educator  Procedures:  DCCV on 11/28  CT chest on 12/3  Antibiotics:  none        11/20/2015 [facility]/ Lorin Hauck   Chief Complaint  Patient presents with  . Pulmonary Consult    Former patient of Dr. SooHalford Chessmant c/o SOB "all my life" worse recently since started on Amiodarone approx 2 months ago. Med was stopped on 11/14/15.   He states he is fine at rest, but any exertion makes him feel SOB- such as just walking from room to room at home. He also c/o chest tightness and non prod cough.   d/c on 3lpm ok room to room and no worse since d/c   And min  assoc cough / still some dysphagia  Was d/c on pred but tapered off. On ami since 10/10/15 but d/c 11/26/15   No obvious  day to day or daytime variabilty or assoc  cp or chest tightness, subjective wheeze overt sinus or hb symptoms. No unusual exp hx or h/o childhood pna/ asthma or knowledge of premature birth.  Sleeping ok without nocturnal  or early am exacerbation  of respiratory  c/o's or need for noct saba. Also denies any obvious fluctuation of symptoms with weather or environmental changes or other aggravating or alleviating factors except as outlined above   Current Medications, Allergies, Complete Past Medical History, Past Surgical History, Family History, and Social History were reviewed in [employer].           Review of Systems    Constitutional: Positive for appetite change and unexpected weight change. Negative for fever, chills and activity change.  HENT: Positive for trouble swallowing. Negative for congestion, dental problem, postnasal drip, rhinorrhea, sneezing, sore throat and voice change.   Eyes: Negative for visual disturbance.  Respiratory: Positive for cough and choking. Negative for shortness of breath.   Cardiovascular: Negative for chest pain and leg swelling.  Gastrointestinal: Negative for nausea, vomiting and abdominal pain.  Genitourinary: Negative for difficulty urinating.  Musculoskeletal: Negative for arthralgias.  Skin: Negative for rash.  Psychiatric/Behavioral: Negative for behavioral problems and confusion.       Objective:   Physical Exam  Hoarse w/c bound wm nad  Wt Readings from Last 3 Encounters:  11/20/15 185 lb (83.915 kg)  11/16/15 179 lb 13.9 oz (81.589 kg)  10/30/15 205 lb 1.6 oz (93.033 kg)    Vital signs reviewed   HEENT: nl dentition, turbinates, and oropharynx. Nl external ear canals without cough reflex   NECK :  without JVD/Nodes/TM/ nl carotid upstrokes bilaterally   LUNGS: no acc muscle use,  Nl a few insp crackles both bases/ no exp wheeze    CV:  RRR  no s3 or murmur or increase in P2, no edema   ABD:  soft and nontender with nl inspiratory excursion in the supine position.  No bruits or organomegaly, bowel sounds nl  MS:    ext warm without deformities, calf tenderness, cyanosis or clubbing No obvious joint restrictions   SKIN: warm and dry without lesions    NEURO:  alert, approp, nl sensorium with  no motor deficits        I personally reviewed images and agree with radiology impression as follows:  CT Chest   11/14/15 1. Interstitial and ground-glass airspace opacities in the right more than left lungs. Often this is from asymmetric/resolving edema or atypical infection, but in this case there is beaded appearance of right lower lobe bronchi  suggesting a fibrotic process and inflammatory pneumonia. Question drug toxicity (especially from the patient's amiodarone use). 2. Treated esophageal cancer with stable lower esophageal thickening. Periesophageal nodal size is decreased from September 2016.       Lab Results  Component Value Date   ESRSEDRATE 125* 11/16/2015       Assessment & Plan:

## 2015-11-20 NOTE — Patient Instructions (Addendum)
Please schedule a follow up office visit in 4 weeks, sooner if needed with cxr on return   Call us back if the med list on this AVS doesn't match up with what you have

## 2015-11-22 DIAGNOSIS — J9612 Chronic respiratory failure with hypercapnia: Secondary | ICD-10-CM

## 2015-11-22 DIAGNOSIS — J9611 Chronic respiratory failure with hypoxia: Secondary | ICD-10-CM | POA: Insufficient documentation

## 2015-11-22 NOTE — Assessment & Plan Note (Signed)
Amiodarone rx 10/10/15 - 11/16/15 with ESR up to 125 on 12/5  Clearly much better since stopping amiodarone and taking a short course of prednisone with no apparent decline since steroids were stopped.  However, Patients typically have been on amiodarone for 6-12 months before this complication manifests.  Of note, serial clinical evaluation for symptoms such as cough dyspnea or fevers is  the preferred method of monitoring for pulmonary toxicity because a decrease in DLCO or lung volumes is a nonspecific for toxicity. Pathologically amiodarone pulmonary toxicity may appear as interstitial pneumonitis, eosinophilic pneumonia, organizing pneumonia, pulmonary fibrosis or less commonly as diffuse alveolar hemorrhage, pulmonary nodules or pleural effusions.  Risk factors for pulmonary toxicity include age greater than 59, daily dose greater than equal to 400 mg, a high cumulative dose, or pre-existing lung disease.  I believe most of his pre-existing lung disease was related to heart failure but he also had been exposed to radiation therapy for esophageal cancer and I suppose it's possible some of the changes could've been due to this although note there was no classic radiation port distribution of changes.  Given the fact that he now has chronic respiratory failure though this is a bit of a moot point as I would not rechallenge with amiodarone in this setting.  Need to be on the lookout for further worsening and f/u with serial cxr's and esr's despite the fact that the chest x-ray is not very sensitive and the sedimentation rate is not very specific, it's the best we can do

## 2015-11-22 NOTE — Assessment & Plan Note (Addendum)
HC03 [id number]   11/16/15 - placed on 3lpm at d/c 11/16/15    Dr. Shaaron Adler seeing this patient for both sleep apnea and dyspnea that he thought was probably related to heart failure and obesity with deconditioning. Presently congestive heart failure and then interstitial lung disease recently acquired and likely due to amiodarone are the most likely culprits  as well as recurrent aspiration although  radiation fibrosis and also chemotherapy associated pneumonitis are in the differential diagnosis (in that order) and he'll need to be monitored carefully for these  Adequate control on present rx, reviewed > no change in rx needed    I had an extended discussion with the patient reviewing all relevant studies completed to date and  lasting 128minutes of a 60  minute new pt eval     Each maintenance medication was reviewed in detail including most importantly the difference between maintenance and prns and under what circumstances the prns are to be triggered using an action plan format that is not reflected in the computer generated alphabetically organized AVS.   There is confusion as to whether he actually has every medicine on his list. I've advised him and fm to go home and double check and call if they don't match then always bring all medicines to  visits to keep things in order here.  Please see instructions for details which were reviewed in writing and the patient given a copy highlighting the part that I personally wrote and discussed at today's ov.

## 2015-11-23 ENCOUNTER — Telehealth: Payer: Self-pay | Admitting: Internal Medicine

## 2015-11-23 ENCOUNTER — Encounter: Payer: Self-pay | Admitting: Physician Assistant

## 2015-11-23 ENCOUNTER — Telehealth: Payer: Self-pay | Admitting: Physician Assistant

## 2015-11-23 ENCOUNTER — Ambulatory Visit (INDEPENDENT_AMBULATORY_CARE_PROVIDER_SITE_OTHER): Payer: Medicare Other | Admitting: Physician Assistant

## 2015-11-23 VITALS — BP 108/64 | HR 87 | Ht 72.0 in | Wt 202.2 lb

## 2015-11-23 DIAGNOSIS — I2510 Atherosclerotic heart disease of native coronary artery without angina pectoris: Secondary | ICD-10-CM

## 2015-11-23 DIAGNOSIS — I5042 Chronic combined systolic (congestive) and diastolic (congestive) heart failure: Secondary | ICD-10-CM | POA: Diagnosis not present

## 2015-11-23 DIAGNOSIS — Z95810 Presence of automatic (implantable) cardiac defibrillator: Secondary | ICD-10-CM

## 2015-11-23 DIAGNOSIS — I255 Ischemic cardiomyopathy: Secondary | ICD-10-CM

## 2015-11-23 DIAGNOSIS — E785 Hyperlipidemia, unspecified: Secondary | ICD-10-CM | POA: Diagnosis not present

## 2015-11-23 DIAGNOSIS — I480 Paroxysmal atrial fibrillation: Secondary | ICD-10-CM

## 2015-11-23 NOTE — Telephone Encounter (Signed)
Forward to Wanda 

## 2015-11-23 NOTE — Progress Notes (Signed)
Patient ID: Walter French, male   DOB: 09/19/1946, 69 y.o.   MRN: [id number]    Date:  11/23/2015   ID:  Walter French, DOB 10/10/1946, MRN [id number]  PCP:  Karlene Einstein, MD  Primary Cardiologist:  [place]   Chief Complaint  Patient presents with  . Chest Pain    pt states he has been having some tightness in his chest for a couple weeks   . Shortness of Breath    some SOB even on oxygen, no light headedness or dizziness   . Edema    no edema      History of Present Illness: Walter French is a 69 y.o. 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 This was thought to be amiodarone pulmonary toxicity. Successful DCCV on 11/09/2015 w/ return to NSR. CHADSVASC = 5.  On Eliquis.  Patient had a left heart catheterization on August 2016 revealing severe three-vessel obstructive disease with patent grafts.  He did have a 50% stenosis after the LIMA insertion.    Patient is here for follow-up evaluation. Patient reports he feels as though is getting stronger but he is tired. He also reports some chest tightness when he exerts himself. He sleeps with his head of his bed elevated 4 inches  to improve his breathing and due to back issues.  He says his weight has gone up a little bit but then is gone back down. He feels overall his been stable.  The patient currently denies nausea, vomiting, fever, chest pain, shortness of breath, orthopnea, dizziness, PND, cough, congestion, abdominal pain, hematochezia, melena, lower extremity edema, claudication.  Wt Readings from Last 3 Encounters:  11/23/15 202 lb 3.2 oz (91.717 kg)  11/20/15 185 lb (83.915 kg)  11/16/15 179 lb 13.9 oz (81.589 kg)     Past Medical History  Diagnosis Date  . Hyperlipidemia     . Hypertension   . Coronary heart disease     a. s/p CABG x 3 (VG->PDA, VG->PL, LIMA->LAD);  b. 2013 Abnl Myoview;  c. 2013 Cath: 3/3 patent grafts, occluded LCX which correlated w/ ischemia on MV->not amenable to PCI->Med Rx.  . Obesity   . CHF (congestive heart failure) ([place])   . VT (ventricular tachycardia) ([place])   . SVT (supraventricular tachycardia) ([place])   . PAD (peripheral artery disease) ([place])     Angiography in February of 2014: Moderate left iliac artery stenosis, mild to moderate diffuse right SFA disease. Severe proximal right popliteal artery stenosis with three-vessel runoff below the knee. Status post self-expanding stent placement to the proximal popliteal artery  . Implantable cardioverter-defibrillator Mdt   . Asthma     "small touch" (07/29/2013)  . History of pneumonia     "3-4 times; last time ~ 2003" (07/29/2013)  . OSA on CPAP   . GERD (gastroesophageal reflux disease)   . H/O hiatal hernia   . Gout   . Anxiety   . Allergy   . Stomach cancer ([place]) 07/23/2015    invasive adenocarcinoma ,esophagus ,distal   . Type II diabetes mellitus ([place])     on insulin  . Sleep apnea   . Hyperlipidemia   . PAD (peripheral artery disease) ([place])   . CHF (congestive  heart failure) ([place])   . A-fib ([place])     a. 11/09/2015: successful DCCV  . CKD (chronic kidney disease), stage III   . S/P radiation therapy 08/12/15-09/24/15    Ge  junction 54 Gy    Current Outpatient Prescriptions  Medication Sig Dispense Refill  . acetaminophen (TYLENOL) 500 MG tablet Take 1,000 mg by mouth every 6 (six) hours as needed for moderate pain or headache.    . allopurinol (ZYLOPRIM) 300 MG tablet Take 300 mg by mouth daily.      [name] apixaban (ELIQUIS) 5 MG TABS tablet Take 1 tablet (5 mg total) by mouth 2 (two) times daily. 60 tablet 11  . atorvastatin (LIPITOR) 40 MG tablet Take 1 tablet (40 mg total) by mouth daily. 90 tablet 3  . carvedilol (COREG) 6.25 MG tablet Take 1 tablet (6.25 mg total) by  mouth 2 (two) times daily with a meal. 60 tablet 0  . digoxin (LANOXIN) 0.125 MG tablet Take 1 tablet (0.125 mg total) by mouth daily. 30 tablet 6  . doxycycline (VIBRA-TABS) 100 MG tablet Take 1 tablet (100 mg total) by mouth every 12 (twelve) hours. 8 tablet 0  . furosemide (LASIX) 40 MG tablet Take 1 tablet (40 mg total) by mouth daily. 30 tablet 0  . levothyroxine (SYNTHROID, LEVOTHROID) 75 MCG tablet Take 75 mcg by mouth daily before breakfast.   0  . LORazepam (ATIVAN) 0.5 MG tablet Take 1 tablet (0.5 mg total) by mouth every 8 (eight) hours. 30 tablet 0  . nitroGLYCERIN (NITROSTAT) 0.4 MG SL tablet Place 1 tablet (0.4 mg total) under the tongue every 5 (five) minutes x 3 doses as needed for chest pain. 25 tablet 3  . polyethylene glycol powder (MIRALAX) powder Take 17 g by mouth 2 (two) times daily. 850 g 0  . potassium chloride SA (K-DUR,KLOR-CON) 20 MEQ tablet Take 1 tablet (20 mEq total) by mouth daily. 30 tablet 11  . ranitidine (ZANTAC) 300 MG tablet Take 1 tablet (300 mg total) by mouth daily as needed for heartburn. (Patient taking differently: Take 300 mg by mouth every morning. ) 30 tablet 7   No current facility-administered medications for this visit.    Allergies:    Allergies  Allergen Reactions  . Adhesive [Tape] Other (See Comments)    Pulls skin off / please use paper tape  . Levaquin [Levofloxacin In D5w] Nausea And Vomiting  . Morphine And Related Nausea And Vomiting    Social History:  The patient  reports that he quit smoking about 19 years ago. His smoking use included Cigarettes. He has a 60 pack-year smoking history. He has never used smokeless tobacco. He reports that he does not drink alcohol or use illicit drugs.   Family history:   Family History  Problem Relation Age of Onset  . Heart disease Brother   . Cancer Brother   . Breast cancer Mother     ROS:  Please see the history of present illness.  All other systems reviewed and negative.   PHYSICAL  EXAM: VS:  BP 108/64 mmHg  Pulse 87  Ht 6' (1.829 m)  Wt 202 lb 3.2 oz (91.717 kg)  BMI 27.42 kg/m2 Well nourished, well developed, in no acute distress,  is on chronic oxygen HEENT: Pupils are equal round react to light accommodation extraocular movements are intact.  Neck: no JVDNo cervical lymphadenopathy. Cardiac: Regular rate and rhythm without murmurs rubs or gallops. Lungs:  Decreased breath sounds throughout but otherwise  clear to auscultation bilaterally, no wheezing, rhonchi or rales Abd: soft, nontender, positive bowel sounds all quadrants, no hepatosplenomegaly Ext: no lower extremity edema.  2+ radial and dorsalis pedis pulses. Skin: warm and dry Neuro:  Grossly normal  EKG:  Sinus rhythm first degree AV block. Left atrial enlargement     ASSESSMENT AND PLAN:  Problem List Items Addressed This Visit    Systolic and diastolic CHF, chronic (HCC)   PAF-    Hyperlipidemia - Primary   Coronary artery disease-status post CABG   Automatic implantable cardioverter-defibrillator in situ     Patient appears euvolemic despite his elevated weight listed above. He does have a lot of clothing on today and different scales.  Continue current Lasix dosing. Is also on digoxin and potassium. He still maintaining sinus rhythm. Continue Coreg at current dose as well as Eliquis.  Continue Lipitor. Does report some chest tightness with exertion. Not convinced this is angina given he just had a heart cath in August. Is likely related to home in very toxicity from amiodarone and hypoxia when he exerts himself.  Discussed daily weight monitoring and low-sodium diet. Specifically told him he should be on 2000 mg or less sodium daily. His son was with him today.

## 2015-11-23 NOTE — Patient Instructions (Signed)
Your physician recommends that you schedule a follow-up appointment in: 3 months with Dr. Jordan. No changes were made today in your therapy. 

## 2015-11-23 NOTE — Telephone Encounter (Signed)
Pt's son Louie Casa is calling back in to speak with the nurse about the pt's meds list . Please f/u with him   Thanks

## 2015-11-23 NOTE — Telephone Encounter (Signed)
Spoke with pt's EC.  He compared the pt's hosp med list to out AVS list.  HE states that the Lorazepam was not listed on hosp list.  Does pt still need to take this?  Also pt was on Florastar 250mg  bid (Probiotic) prior to hospital stay.  Does he need to continue this?

## 2015-11-23 NOTE — Telephone Encounter (Signed)
Ok to stop florastor p abx complete  Ok to use lorazepam but only prn nerves/ anxiety/sleep

## 2015-11-23 NOTE — Telephone Encounter (Signed)
Patient's son called back to inform us that the medication list is correct except for the lorazepam. He questioned if the patient should be taking it. I told him he will need to speak with the prescribing physician. He will call that doctor.

## 2015-11-23 NOTE — Telephone Encounter (Signed)
Spoke with pt's EC and advised of Dr Gustavus Bryant recommendations. He verbalized understanding.  Nothing further is needed.

## 2015-12-03 ENCOUNTER — Encounter: Payer: Medicare Other | Admitting: Physician Assistant

## 2015-12-12 ENCOUNTER — Other Ambulatory Visit: Payer: Self-pay | Admitting: Cardiology

## 2015-12-15 NOTE — Telephone Encounter (Signed)
Rx request sent to pharmacy.  

## 2015-12-16 ENCOUNTER — Ambulatory Visit (HOSPITAL_COMMUNITY)
Admission: RE | Admit: 2015-12-16 | Discharge: 2015-12-16 | Disposition: A | Discharge status: Home / Self Care | Payer: PPO | Source: Ambulatory Visit | Attending: Cardiovascular Disease | Admitting: Cardiovascular Disease

## 2015-12-16 ENCOUNTER — Other Ambulatory Visit: Payer: Self-pay | Admitting: Cardiovascular Disease

## 2015-12-16 DIAGNOSIS — E785 Hyperlipidemia, unspecified: Secondary | ICD-10-CM | POA: Diagnosis not present

## 2015-12-16 DIAGNOSIS — I779 Disorder of arteries and arterioles, unspecified: Secondary | ICD-10-CM | POA: Diagnosis not present

## 2015-12-16 DIAGNOSIS — Z959 Presence of cardiac and vascular implant and graft, unspecified: Secondary | ICD-10-CM | POA: Insufficient documentation

## 2015-12-16 DIAGNOSIS — Z95820 Peripheral vascular angioplasty status with implants and grafts: Secondary | ICD-10-CM

## 2015-12-16 DIAGNOSIS — R938 Abnormal findings on diagnostic imaging of other specified body structures: Secondary | ICD-10-CM | POA: Insufficient documentation

## 2015-12-16 DIAGNOSIS — N183 Chronic kidney disease, stage 3 (moderate): Secondary | ICD-10-CM | POA: Insufficient documentation

## 2015-12-16 DIAGNOSIS — I739 Peripheral vascular disease, unspecified: Secondary | ICD-10-CM | POA: Insufficient documentation

## 2015-12-16 DIAGNOSIS — E1122 Type 2 diabetes mellitus with diabetic chronic kidney disease: Secondary | ICD-10-CM | POA: Diagnosis not present

## 2015-12-16 DIAGNOSIS — I129 Hypertensive chronic kidney disease with stage 1 through stage 4 chronic kidney disease, or unspecified chronic kidney disease: Secondary | ICD-10-CM | POA: Insufficient documentation

## 2015-12-22 ENCOUNTER — Other Ambulatory Visit (INDEPENDENT_AMBULATORY_CARE_PROVIDER_SITE_OTHER): Payer: PPO

## 2015-12-22 ENCOUNTER — Ambulatory Visit (INDEPENDENT_AMBULATORY_CARE_PROVIDER_SITE_OTHER)
Admission: RE | Admit: 2015-12-22 | Discharge: 2015-12-22 | Disposition: A | Discharge status: Home / Self Care | Payer: PPO | Source: Ambulatory Visit | Attending: Internal Medicine | Admitting: Internal Medicine

## 2015-12-22 ENCOUNTER — Encounter: Payer: Self-pay | Admitting: Internal Medicine

## 2015-12-22 ENCOUNTER — Ambulatory Visit (INDEPENDENT_AMBULATORY_CARE_PROVIDER_SITE_OTHER): Payer: PPO | Admitting: Internal Medicine

## 2015-12-22 VITALS — BP 120/68 | HR 79 | Ht 72.0 in | Wt 192.0 lb

## 2015-12-22 DIAGNOSIS — J9612 Chronic respiratory failure with hypercapnia: Secondary | ICD-10-CM

## 2015-12-22 DIAGNOSIS — J984 Other disorders of lung: Secondary | ICD-10-CM | POA: Diagnosis not present

## 2015-12-22 DIAGNOSIS — J9611 Chronic respiratory failure with hypoxia: Secondary | ICD-10-CM | POA: Diagnosis not present

## 2015-12-22 DIAGNOSIS — T462X5A Adverse effect of other antidysrhythmic drugs, initial encounter: Secondary | ICD-10-CM

## 2015-12-22 DIAGNOSIS — R918 Other nonspecific abnormal finding of lung field: Secondary | ICD-10-CM | POA: Diagnosis not present

## 2015-12-22 LAB — CBC WITH DIFFERENTIAL/PLATELET
Basophils Absolute: 0 10*3/uL (ref 0.0–0.1)
Basophils Relative: 0.8 % (ref 0.0–3.0)
Eosinophils Absolute: 0.2 10*3/uL (ref 0.0–0.7)
Eosinophils Relative: 3.3 % (ref 0.0–5.0)
HCT: 33.3 % — ABNORMAL LOW (ref 39.0–52.0)
Hemoglobin: 10.9 g/dL — ABNORMAL LOW (ref 13.0–17.0)
LYMPHS ABS: 0.9 10*3/uL (ref 0.7–4.0)
Lymphocytes Relative: 15.8 % (ref 12.0–46.0)
MCHC: 32.7 g/dL (ref 30.0–36.0)
MCV: 86.3 fl (ref 78.0–100.0)
MONO ABS: 1 10*3/uL (ref 0.1–1.0)
Monocytes Relative: 16.4 % — ABNORMAL HIGH (ref 3.0–12.0)
NEUTROS ABS: 3.7 10*3/uL (ref 1.4–7.7)
NEUTROS PCT: 63.7 % (ref 43.0–77.0)
PLATELETS: 152 10*3/uL (ref 150.0–400.0)
RBC: 3.86 Mil/uL — AB (ref 4.22–5.81)
RDW: 19 % — ABNORMAL HIGH (ref 11.5–15.5)
WBC: 5.9 10*3/uL (ref 4.0–10.5)

## 2015-12-22 LAB — SEDIMENTATION RATE: SED RATE: 85 mm/h — AB (ref 0–22)

## 2015-12-22 NOTE — Progress Notes (Signed)
Subjective:    Patient ID: Walter French, male    DOB: [birth year],    MRN: [id number]    Brief patient profile:  70 yowm quit smoking in 1997 p cabg did better p rehab x a few years and then gradually decline fxn level due to sob ? chf  then much worse since  dx es ca  chemo / RT but no 02 until admit hosp with acute worsening sob with resp failure:   Admit date: 11/07/2015 Discharge date: 11/16/2015  Recommendations for Outpatient Follow-up:  1. Discharge to home with home health services, PT/OT/RN 2. F/u in one week in the heart failure clinic. Repeat BMP at next visit 3. F/u with pulmonology ASAP for new patient appointment regarding amiodarone toxicity 4. 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[place]) Active Problems:  Hyperlipidemia  Coronary artery disease-status post CABG  Automatic implantable cardioverter-defibrillator in situ  Systolic and diastolic CHF, chronic (HCC)  Gastroesophageal cancer ([place])  PAF- s/p DCCV this admission  Anemia of chronic disease  Hypothyroidism  Acute on chronic combined systolic (congestive) and diastolic (congestive) heart failure (HCC)  Chronic anticoagulation - Eliquis, CHADS2VASC=5  Hypotension  Cardiomyopathy, ischemic-30-35%  Malnutrition of moderate degree  Constipation  Adenocarcinoma of esophagus (HCC)   Wt Readings from Last 3 Encounters:  11/16/15 81.589 kg (179 lb 13.9 oz)  10/30/15 93.033 kg (205 lb 1.6 oz)  10/28/15 93.441 kg (206 lb)    History of present illness:   70 y/o male admitted 10/05/15 with Afib RVR CHad2Vasc2 score=5, Failed DCCV and intolerant BB and therefore on amiodarone and digoxin, CAd s/p CABG x3, chronic systolic heart failure with EF 30-35% 07/2015, H/o VT s/p [employer] ICD, adenoCa Ge-[place]unction 07/2015 s/p XRT and Taxol/carbioplain completing Rx 09/2015. He was admitted due to  hypoglycemia due to poor PO intake despite being taken off his insulin and being started on glucotrol.  Hospital Course:   Acute hypoxic respiratory failure due to acute on chronic systolic heart failure, EF 30-35% possible atypical pneumonia and amiodarone pulmonary toxicity. He received 7-days of XRT 2 months ago with carboplatin and paclitaxel, however, although XRT may have effected lung, the chemotherapeutic agents are not typical causes of pneumonitis. He was diuresed with Lasix 40 mg IV twice a day and his weight decreased from 86.6 kg to 81.6 kg on the date of discharge. Despite diuresis, he continued to have a persistent high oxygen requirement of 4-5 L. A CT of the chest demonstrated changes consistent with either atypical pneumonia or amiodarone toxicity. He was started on doxycycline for atypical pneumonia on 12/3 and had a small improvement in his breathing over the subsequent 2 days. Despite his improvement on doxycycline, I suspect that he likely has amiodarone toxicity. His ESR was 125. He was not started on steroids secondary to his acute on chronic systolic heart failure. He required 3 L of oxygen at the time of discharge.  Atrial fibrillation s/p successful DCCV on 11/09/2015. He was continued on Apixaban due to [place] score= 5, however his amiodarone was discontinued secondary to pulmonary toxicity. Cardiology recommended Tikosyn should his tachycardia recur.   Abdominal pain likely secondary to constipation and resolved with bowel regimen  Hypoglycemia due to malignancy associated weight loss. His hemoglobin A1c was 5.6 he met with the nutritionist and was encouraged to use supplements. His cortisol level was 22 wnl. TSH was 0.588 wnl.  Hypothyroidism, stable, continue levothyroxine 21mg daily and  repeat TSH in late December.   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 Completed treatment 09/2015 with Taxol/carboplatin and XRT and CT scan  demonstrated improvement in area at GE junction during this hospitalization.   HLD, stable, continue atorvastatin 7m daily  Gout stable, continue allopurinol 3036mdaily  Hypokalemia due to lasix. Continued daily potassium  Hyponatremia due to heart failure, stable.  Generalized weakness. PT/OT recommending SNF, however, patient declined and requested to go home. He will go home with 24 hour supervision and home health services.   Consultants:   Cardiology  Diabetic educator  Procedures:  DCCV on 11/28  CT chest on 12/3  Antibiotics:  none        11/20/2015 [facility]st [facility] office visit/ Reyan Helle   Chief Complaint  Patient presents with  . Pulmonary Consult    Former patient of Dr. SoHalford ChessmanPt c/o SOB "all my life" worse recently since started on Amiodarone approx 2 months ago. Med was stopped on 11/14/15.   He states he is fine at rest, but any exertion makes him feel SOB- such as just walking from room to room at home. He also c/o chest tightness and non prod cough.   d/c on 3lpm ok room to room and no worse since d/c   And min  assoc cough / still some dysphagia  Was d/c on pred but tapered off. On amio since 10/10/15 but d/c 11/26/15  rec Please schedule a follow up office visit in 4 weeks, sooner if needed with cxr on return     12/22/2015  f/u ov/Mingo Siegert re: amio assoc ILD / hypoxemia > pt stopped 02 on his own / no resp rx at all at this point  Chief Complaint  Patient presents with  . Follow-up    Pt states that his breathing is much improved. He has had minimal cough and chest tightness. Cough is prod with minimal white sputum.    no longer using any 02 / Not limited by breathing from desired activities    No obvious day to day or daytime variability or assoc excess/ purulent sputum or mucus plugs   or cp or chest tightness, subjective wheeze or overt sinus or hb symptoms. No unusual exp hx or h/o childhood pna/ asthma or knowledge of premature  birth.  Sleeping ok without nocturnal  or early am exacerbation  of respiratory  c/o's or need for noct saba. Also denies any obvious fluctuation of symptoms with weather or environmental changes or other aggravating or alleviating factors except as outlined above   Current Medications, Allergies, Complete Past Medical History, Past Surgical History, Family History, and Social History were reviewed in [employer].  ROS  The following are not active complaints unless bolded sore throat, dysphagia, dental problems, itching, sneezing,  nasal congestion or excess/ purulent secretions, ear ache,   fever, chills, sweats, unintended wt loss, classically pleuritic or exertional cp, hemoptysis,  orthopnea pnd or leg swelling, presyncope, palpitations, abdominal pain, anorexia, nausea, vomiting, diarrhea  or change in bowel or bladder habits, change in stools or urine, dysuria,hematuria,  rash, arthralgias, visual complaints, headache, numbness, weakness or ataxia or problems with walking or coordination,  change in mood/affect or memory.                        Objective:   Physical Exam  Pleasant amb wm no longer  w/c bound  / did not bring in 02  12/22/2015      192   11/20/15 185 lb (83.915 kg)  11/16/15 179 lb 13.9 oz (81.589 kg)  10/30/15 205 lb 1.6 oz (93.033 kg)    Vital signs reviewed   HEENT: nl   turbinates, and oropharynx. Nl external ear canals without cough reflex - edentulous    NECK :  without JVD/Nodes/TM/ nl carotid upstrokes bilaterally   LUNGS: no acc muscle use,    a few insp crackles both bases/ no exp wheeze    CV:  RRR  no s3 or murmur or increase in P2, no edema   ABD:  soft and nontender with nl inspiratory excursion in the supine position. No bruits or organomegaly, bowel sounds nl  MS:    ext warm without deformities, calf tenderness, cyanosis or clubbing No obvious joint restrictions   SKIN: warm and dry without lesions     NEURO:  alert, approp, nl sensorium with  no motor deficits        I personally reviewed images and agree with radiology impression as follows:  CT Chest   11/14/15 1. Interstitial and ground-glass airspace opacities in the right more than left lungs. Often this is from asymmetric/resolving edema or atypical infection, but in this case there is beaded appearance of right lower lobe bronchi suggesting a fibrotic process and inflammatory pneumonia. Question drug toxicity (especially from the patient's amiodarone use). 2. Treated esophageal cancer with stable lower esophageal thickening. Periesophageal nodal size is decreased from September 2016.    CXR PA and Lateral:   12/22/2015 :    I personally reviewed images and agree with radiology impression as follows:    Persistent mild interstitial prominence of both lungs. Stable cardiomegaly without pulmonary edema.     Labs ordered 12/22/2015  Lab Results  Component Value Date   WBC 5.9 12/22/2015   HGB 10.9* 12/22/2015   HCT 33.3* 12/22/2015   MCV 86.3 12/22/2015   PLT 152.0 12/22/2015      Lab Results  Component Value Date   ESRSEDRATE 85* 12/22/2015   ESRSEDRATE 125* 11/16/2015       Assessment & Plan:

## 2015-12-22 NOTE — Patient Instructions (Addendum)
Please remember to go to the lab and x-ray department downstairs for your tests - we will call you with the results when they are available.  Please schedule a follow up office visit in 6 weeks, call sooner if needed with pfts on return    

## 2015-12-23 NOTE — Assessment & Plan Note (Addendum)
Amiodarone rx 10/10/15 - 11/16/15 with ESR up to 125 on 11/16/15 and down to 85 12/22/2015 s rx with steroids     Appears to be gradually resolving s intervention > needs final f/u ov at 6 weeks for baseline pfts then   I had an extended discussion with the patient reviewing all relevant studies completed to date and  lasting 15 to 20 minutes of a 25 minute visit    Each maintenance medication was reviewed in detail including most importantly the difference between maintenance and prns and under what circumstances the prns are to be triggered using an action plan format that is not reflected in the computer generated alphabetically organized AVS.    Please see instructions for details which were reviewed in writing and the patient given a copy highlighting the part that I personally wrote and discussed at today's ov.

## 2015-12-23 NOTE — Assessment & Plan Note (Signed)
HC03 [id number]   11/16/15 - placed on 3lpm at d/c 11/16/15  - 12/22/2015  Walked RA x 3 laps @ 185 ft each stopped due to End of study, nl pace, no sob or desat  > d/c all 02

## 2015-12-29 DIAGNOSIS — Z961 Presence of intraocular lens: Secondary | ICD-10-CM | POA: Diagnosis not present

## 2015-12-29 DIAGNOSIS — H01001 Unspecified blepharitis right upper eyelid: Secondary | ICD-10-CM | POA: Diagnosis not present

## 2015-12-29 DIAGNOSIS — H40053 Ocular hypertension, bilateral: Secondary | ICD-10-CM | POA: Diagnosis not present

## 2015-12-29 DIAGNOSIS — H5213 Myopia, bilateral: Secondary | ICD-10-CM | POA: Diagnosis not present

## 2015-12-29 DIAGNOSIS — E119 Type 2 diabetes mellitus without complications: Secondary | ICD-10-CM | POA: Diagnosis not present

## 2015-12-29 DIAGNOSIS — H2511 Age-related nuclear cataract, right eye: Secondary | ICD-10-CM | POA: Diagnosis not present

## 2015-12-29 DIAGNOSIS — H5010 Unspecified exotropia: Secondary | ICD-10-CM | POA: Diagnosis not present

## 2015-12-29 DIAGNOSIS — H43813 Vitreous degeneration, bilateral: Secondary | ICD-10-CM | POA: Diagnosis not present

## 2015-12-29 DIAGNOSIS — H25011 Cortical age-related cataract, right eye: Secondary | ICD-10-CM | POA: Diagnosis not present

## 2016-01-05 ENCOUNTER — Telehealth: Payer: Self-pay | Admitting: *Deleted

## 2016-01-05 NOTE — Telephone Encounter (Signed)
I spoke to patient for 61-month phone call for [email or web address] .Patient was rehospitalized for hypoglycemia. I thanked patient for participating in the study.

## 2016-01-13 ENCOUNTER — Encounter: Payer: Self-pay | Admitting: *Deleted

## 2016-01-21 ENCOUNTER — Ambulatory Visit (HOSPITAL_BASED_OUTPATIENT_CLINIC_OR_DEPARTMENT_OTHER): Payer: PPO | Admitting: Oncology

## 2016-01-21 ENCOUNTER — Telehealth: Payer: Self-pay | Admitting: Oncology

## 2016-01-21 VITALS — BP 124/70 | HR 90 | Resp 18 | Ht 72.0 in | Wt 203.3 lb

## 2016-01-21 DIAGNOSIS — C160 Malignant neoplasm of cardia: Secondary | ICD-10-CM | POA: Diagnosis not present

## 2016-01-21 DIAGNOSIS — M79605 Pain in left leg: Secondary | ICD-10-CM | POA: Diagnosis not present

## 2016-01-21 DIAGNOSIS — C155 Malignant neoplasm of lower third of esophagus: Secondary | ICD-10-CM

## 2016-01-21 DIAGNOSIS — M79604 Pain in right leg: Secondary | ICD-10-CM

## 2016-01-21 DIAGNOSIS — E119 Type 2 diabetes mellitus without complications: Secondary | ICD-10-CM

## 2016-01-21 DIAGNOSIS — I10 Essential (primary) hypertension: Secondary | ICD-10-CM

## 2016-01-21 NOTE — Telephone Encounter (Signed)
per pof to sch pt appt-pt stated will see on MY CHART

## 2016-01-21 NOTE — Progress Notes (Signed)
  [facility] OFFICE PROGRESS NOTE   Diagnosis: Gastroesophageal carcinoma  INTERVAL HISTORY:   Mr. Gegenheimer returns as scheduled. He lost his teeth in an accident. He has difficulty chewing, but no significant dysphagia. He complains of bilateral leg pain. He reports recent bilateral lower extremity vascular studies were normal. He was admitted in November with amiodarone pulmonary toxicity.  Objective:  Vital signs in last 24 hours:  Blood pressure 124/70, pulse 90, resp. rate 18, height 6' (1.829 m), weight 203 lb 4.8 oz (92.216 kg), SpO2 97 %.    HEENT: Neck without mass Lymphatics: No cervical, supraclavicular, or axillary nodes Resp: Inspiratory rales at the lower posterior chest bilaterally Cardio: Regular rate and rhythm, distant heart sounds GI: No hepatomegaly, no mass Vascular: No leg edema Neuro: The leg strength is intact bilaterally    Lab Results:   Medications: I have reviewed the patient's current medications.  Assessment/Plan: 1. Adenocarcinoma the gastroesophageal junction, status post an endoscopic biopsy of a distal esophagus mass and gastric cardia ulcer on 07/23/2015  Staging CT scans consistent with locally metastatic adenopathy  PET scan 08/07/2015 with hypermetabolic mass at the distal esophagus/GE junction and a hypermetabolic paraesophageal, gastrohepatic, and portacaval lymph nodes  Initiation of radiation 08/12/2015 with completion 09/24/2015; cycle 1 Taxol/carboplatin 08/13/2015; Final cycle given 09/22/2015.  2. History of coronary artery disease 3. Congestive heart failure 4. History of ventricular tachycardia and supraventricular tachycardia 5. Peripheral arterial disease 6. Implantable defibrillator 7. Diabetes 8. Gout 9. Hospitalization from 09/10/15 to 09/15/15 due to Acute Hypoxic Respiratory Failure and Sepsis as well as new onset of Atrial Fibrillation, 10. Hospitalization 09/30/2015 through 10/02/2015 with probable  UTI/bronchitis. 11. Hospitalization 10/05/2015 with rapid atrial fibrillation 12. Hospitalization 11/07/2015 with respiratory failure felt to be secondary to amiodarone toxicity   Disposition:  Mr. Salah is in clinical remission from gastroesophageal carcinoma. I suspect the leg pain is unrelated to the cancer diagnosis. He will follow-up with Dr. Vista Lawman. Mr. Masoud will return for an office and lab visit in 3 months.    Betsy Coder, MD  01/21/2016  3:13 PM

## 2016-01-22 DIAGNOSIS — G8929 Other chronic pain: Secondary | ICD-10-CM | POA: Diagnosis not present

## 2016-01-22 DIAGNOSIS — M545 Low back pain: Secondary | ICD-10-CM | POA: Diagnosis not present

## 2016-02-02 ENCOUNTER — Ambulatory Visit (INDEPENDENT_AMBULATORY_CARE_PROVIDER_SITE_OTHER): Payer: PPO | Admitting: Cardiovascular Disease

## 2016-02-02 ENCOUNTER — Encounter: Payer: Self-pay | Admitting: Cardiovascular Disease

## 2016-02-02 VITALS — BP 138/68 | HR 76 | Ht 72.0 in | Wt 200.0 lb

## 2016-02-02 DIAGNOSIS — I739 Peripheral vascular disease, unspecified: Secondary | ICD-10-CM | POA: Diagnosis not present

## 2016-02-02 NOTE — Progress Notes (Signed)
Primary care physician: Dr. Vista Lawman Cardiologist: Dr. Martinique and Dr. Caryl Comes.  HPI  This is a 70 year old male is here today for a followup visit regarding peripheral arterial disease. He has known history of coronary artery disease status post CABG and ischemic cardiomyopathy.  He was seen in early 2014 for severe right calf claudication. I performed lower extremity angiography in 01/2013 which showed moderate left common iliac artery stenosis. On the right side, there was mild to moderate diffuse SFA disease and severe proximal popliteal artery stenosis with three-vessel runoff below the knee. I placed a self-expanding stent to the proximal popliteal artery without complications. He had resolution of claudication at that time.  Most recent duplex in January 2017 showed patent stent with borderline ABI on the right side at 0.93 and normal on the left. The patient was diagnosed with esophageal cancer in September of last year and underwent chemotherapy and radiation therapy. He lost 86 pounds. He is currently in remission. He was hospitalized in November with hypoglycemia and amiodarone-induced lung toxicity.    Allergies  Allergen Reactions  . Adhesive [Tape] Other (See Comments)    Pulls skin off / please use paper tape  . Levaquin [Levofloxacin In D5w] Nausea And Vomiting  . Morphine And Related Nausea And Vomiting     Current Outpatient Prescriptions on File Prior to Visit  Medication Sig Dispense Refill  . allopurinol (ZYLOPRIM) 300 MG tablet Take 300 mg by mouth daily.      [name] apixaban (ELIQUIS) 5 MG TABS tablet Take 1 tablet (5 mg total) by mouth 2 (two) times daily. 60 tablet 11  . atorvastatin (LIPITOR) 40 MG tablet Take 1 tablet (40 mg total) by mouth daily. 90 tablet 3  . carvedilol (COREG) 6.25 MG tablet take 1 tablet by mouth twice a day with food 60 tablet 3  . digoxin (LANOXIN) 0.125 MG tablet Take 1 tablet (0.125 mg total) by mouth daily. 30 tablet 6  . furosemide (LASIX)  40 MG tablet Take 1 tablet (40 mg total) by mouth daily. 30 tablet 0  . levothyroxine (SYNTHROID, LEVOTHROID) 75 MCG tablet Take 75 mcg by mouth daily before breakfast.   0  . LORazepam (ATIVAN) 0.5 MG tablet Take 1 tablet (0.5 mg total) by mouth every 8 (eight) hours. (Patient taking differently: Take 0.5 mg by mouth every 8 (eight) hours as needed. ) 30 tablet 0  . nitroGLYCERIN (NITROSTAT) 0.4 MG SL tablet Place 1 tablet (0.4 mg total) under the tongue every 5 (five) minutes x 3 doses as needed for chest pain. 25 tablet 3  . potassium chloride SA (K-DUR,KLOR-CON) 20 MEQ tablet Take 1 tablet (20 mEq total) by mouth daily. 30 tablet 11  . ranitidine (ZANTAC) 300 MG tablet Take 1 tablet (300 mg total) by mouth daily as needed for heartburn. (Patient taking differently: Take 300 mg by mouth every morning. ) 30 tablet 7   No current facility-administered medications on file prior to visit.     Past Medical History  Diagnosis Date  . Hyperlipidemia   . Hypertension   . Coronary heart disease     a. s/p CABG x 3 (VG->PDA, VG->PL, LIMA->LAD);  b. 2013 Abnl Myoview;  c. 2013 Cath: 3/3 patent grafts, occluded LCX which correlated w/ ischemia on MV->not amenable to PCI->Med Rx.  . Obesity   . CHF (congestive heart failure) ([place])   . VT (ventricular tachycardia) ([place])   . SVT (supraventricular tachycardia) ([place])   . PAD (peripheral artery  disease) ([place])     Angiography in February of 2014: Moderate left iliac artery stenosis, mild to moderate diffuse right SFA disease. Severe proximal right popliteal artery stenosis with three-vessel runoff below the knee. Status post self-expanding stent placement to the proximal popliteal artery  . Implantable cardioverter-defibrillator Mdt   . Asthma     "small touch" (07/29/2013)  . History of pneumonia     "3-4 times; last time ~ 2003" (07/29/2013)  . OSA on CPAP   . GERD (gastroesophageal reflux disease)   . H/O hiatal hernia   . Gout   . Anxiety   .  Allergy   . Stomach cancer ([place]) 07/23/2015    invasive adenocarcinoma ,esophagus ,distal   . Type II diabetes mellitus ([place])     on insulin  . Sleep apnea   . Hyperlipidemia   . PAD (peripheral artery disease) ([place])   . CHF (congestive heart failure) ([place])   . A-fib ([place])     a. 11/09/2015: successful DCCV  . CKD (chronic kidney disease), stage III   . S/P radiation therapy 08/12/15-09/24/15    Ge  junction 54 Gy     Past Surgical History  Procedure Laterality Date  . Tonsillectomy    . Cardiac catheterization    . Wrist fracture surgery Right   . Cataract extraction w/ intraocular lens implant Left   . Cardiac defibrillator placement  1997; ~ 2003  . Coronary artery bypass graft  1997; 2007    "X3" (07/29/2013)  . Nerve, tendon and artery repair Left 09/20/2013    Procedure: IRRIGATION AND DEBRIDEMENT,Exploration and Repair of Ulnar/Digital  Artery Nerve of Left Index finger;  Surgeon: Roseanne Kaufman, MD;  Location: [facility];  Service: Orthopedics;  Laterality: Left;  . Abdominal aortagram N/A 01/30/2013    Procedure: ABDOMINAL Maxcine Ham;  Surgeon: Wellington Hampshire, MD;  Location: [facility] CATH LAB;  Service: Cardiovascular;  Laterality: N/A;  . Cardiac catheterization N/A 07/22/2015    Procedure: Left Heart Cath and Coronary Angiography;  Surgeon: Peter M Martinique, MD;  Location: [facility] CV LAB;  Service: Cardiovascular;  Laterality: N/A;  . Esophagogastroduodenoscopy N/A 07/23/2015    Procedure: ESOPHAGOGASTRODUODENOSCOPY (EGD);  Surgeon: Wonda Horner, MD;  Location: [facility] ENDOSCOPY;  Service: Endoscopy;  Laterality: N/A;  . Tee without cardioversion N/A 10/06/2015    Procedure: TRANSESOPHAGEAL ECHOCARDIOGRAM (TEE);  Surgeon: Pixie Casino, MD;  [facility];  Service: Cardiovascular;  Laterality: N/A;  . Cardioversion N/A 11/09/2015    Procedure: CARDIOVERSION;  Surgeon: Pixie Casino, MD;  Location: [facility] ENDOSCOPY;  Service: Cardiovascular;  Laterality: N/A;     Family  History  Problem Relation Age of Onset  . Heart disease Brother   . Cancer Brother   . Breast cancer Mother      Social History   Social History  . Marital Status: Single    Spouse Name: N/A  . Number of Children: 0  . Years of Education: N/A   Occupational History  . retired Surveyor, mining   .     Social History Main Topics  . Smoking status: Former Smoker -- 2.00 packs/day for 30 years    Types: Cigarettes    Quit date: 12/13/1995  . Smokeless tobacco: Never Used  . Alcohol Use: No     Comment: 07/29/2013 "not touched any alcohol since 1997"  . Drug Use: No  . Sexual Activity: Not Currently   Other Topics Concern  . Not on file   Social History  Narrative   Single, never married   Retired Administrator   Close friend, [name] and her children are his main support system   Independent in ADLs.       PHYSICAL EXAM   BP 138/68 mmHg  Pulse 76  Ht 6' (1.829 m)  Wt 200 lb (90.719 kg)  BMI 27.12 kg/m2 Constitutional: He is oriented to person, place, and time. He appears well-developed and well-nourished. No distress.  HENT: No nasal discharge.  Head: Normocephalic and atraumatic.  Eyes: Pupils are equal and round. Right eye exhibits no discharge. Left eye exhibits no discharge.  Neck: Normal range of motion. Neck supple. No JVD present. No thyromegaly present.  Cardiovascular: Normal rate, regular rhythm, normal heart sounds and. Exam reveals no gallop and no friction rub. No murmur heard.  Pulmonary/Chest: Effort normal and breath sounds normal. No stridor. No respiratory distress. He has no wheezes. He has no rales. He exhibits no tenderness.  Abdominal: Soft. Bowel sounds are normal. He exhibits no distension. There is no tenderness. There is no rebound and no guarding.  Musculoskeletal: Normal range of motion. He exhibits no edema and no tenderness.  Neurological: He is alert and oriented to person, place, and time. Coordination normal.  Skin: Skin is  warm and dry. No rash noted. He is not diaphoretic. No erythema. No pallor.  Psychiatric: He has a normal mood and affect. His behavior is normal. Judgment and thought content normal.  Vascular: Femoral pulses are mildly diminished bilaterally.  DP: palpable on both side. PT : Palpable on the left side      ASSESSMENT AND PLAN

## 2016-02-02 NOTE — Patient Instructions (Signed)
Medication Instructions: Continue same medications.   Labwork: None.   Procedures/Testing: None.   Follow-Up: 1 year with Dr. Arida.   Any Additional Special Instructions Will Be Listed Below (If Applicable).     If you need a refill on your cardiac medications before your next appointment, please call your pharmacy.   

## 2016-02-07 DIAGNOSIS — I739 Peripheral vascular disease, unspecified: Secondary | ICD-10-CM | POA: Insufficient documentation

## 2016-02-07 NOTE — Assessment & Plan Note (Signed)
He is overall stable with patent stent in the right popliteal artery in stable ABI. He has palpable pulses. Continue medical therapy and repeat Doppler in 1 year.

## 2016-02-10 ENCOUNTER — Encounter: Payer: Self-pay | Admitting: Internal Medicine

## 2016-02-10 ENCOUNTER — Ambulatory Visit (INDEPENDENT_AMBULATORY_CARE_PROVIDER_SITE_OTHER): Payer: PPO | Admitting: Internal Medicine

## 2016-02-10 ENCOUNTER — Encounter: Payer: Self-pay | Admitting: *Deleted

## 2016-02-10 ENCOUNTER — Other Ambulatory Visit (INDEPENDENT_AMBULATORY_CARE_PROVIDER_SITE_OTHER): Payer: PPO

## 2016-02-10 VITALS — BP 110/64 | HR 90 | Ht 72.0 in | Wt 204.0 lb

## 2016-02-10 DIAGNOSIS — D61818 Other pancytopenia: Secondary | ICD-10-CM | POA: Diagnosis not present

## 2016-02-10 DIAGNOSIS — T462X5A Adverse effect of other antidysrhythmic drugs, initial encounter: Secondary | ICD-10-CM

## 2016-02-10 DIAGNOSIS — J984 Other disorders of lung: Secondary | ICD-10-CM

## 2016-02-10 LAB — PULMONARY FUNCTION TEST
DL/VA % pred: 63 %
DL/VA: 2.99 ml/min/mmHg/L
DLCO COR: 12.84 ml/min/mmHg
DLCO UNC % PRED: 29 %
DLCO cor % pred: 36 %
DLCO unc: 10.45 ml/min/mmHg
FEF 25-75 PRE: 1.88 L/s
FEF 25-75 Post: 1.98 L/sec
FEF2575-%Change-Post: 5 %
FEF2575-%PRED-PRE: 67 %
FEF2575-%Pred-Post: 70 %
FEV1-%Change-Post: 1 %
FEV1-%PRED-PRE: 59 %
FEV1-%Pred-Post: 60 %
FEV1-POST: 2.18 L
FEV1-Pre: 2.15 L
FEV1FVC-%Change-Post: 0 %
FEV1FVC-%Pred-Pre: 104 %
FEV6-%CHANGE-POST: 0 %
FEV6-%PRED-PRE: 59 %
FEV6-%Pred-Post: 60 %
FEV6-POST: 2.79 L
FEV6-PRE: 2.77 L
FEV6FVC-%Change-Post: 0 %
FEV6FVC-%PRED-POST: 105 %
FEV6FVC-%Pred-Pre: 104 %
FVC-%Change-Post: 0 %
FVC-%PRED-POST: 57 %
FVC-%Pred-Pre: 57 %
FVC-POST: 2.79 L
FVC-Pre: 2.78 L
POST FEV6/FVC RATIO: 100 %
PRE FEV1/FVC RATIO: 77 %
Post FEV1/FVC ratio: 78 %
Pre FEV6/FVC Ratio: 100 %
RV % pred: 70 %
RV: 1.77 L
TLC % PRED: 61 %
TLC: 4.58 L

## 2016-02-10 LAB — CBC WITH DIFFERENTIAL/PLATELET
BASOS ABS: 0 10*3/uL (ref 0.0–0.1)
Basophils Relative: 0.3 % (ref 0.0–3.0)
EOS ABS: 0.2 10*3/uL (ref 0.0–0.7)
Eosinophils Relative: 3.7 % (ref 0.0–5.0)
HEMATOCRIT: 31.4 % — AB (ref 39.0–52.0)
Hemoglobin: 10.6 g/dL — ABNORMAL LOW (ref 13.0–17.0)
LYMPHS PCT: 15 % (ref 12.0–46.0)
Lymphs Abs: 0.8 10*3/uL (ref 0.7–4.0)
MCHC: 33.6 g/dL (ref 30.0–36.0)
MCV: 88.5 fl (ref 78.0–100.0)
MONO ABS: 0.6 10*3/uL (ref 0.1–1.0)
Monocytes Relative: 11.6 % (ref 3.0–12.0)
NEUTROS ABS: 3.6 10*3/uL (ref 1.4–7.7)
NEUTROS PCT: 69.4 % (ref 43.0–77.0)
PLATELETS: 119 10*3/uL — AB (ref 150.0–400.0)
RBC: 3.55 Mil/uL — ABNORMAL LOW (ref 4.22–5.81)
RDW: 17.3 % — ABNORMAL HIGH (ref 11.5–15.5)
WBC: 5.2 10*3/uL (ref 4.0–10.5)

## 2016-02-10 LAB — SEDIMENTATION RATE: SED RATE: 54 mm/h — AB (ref 0–22)

## 2016-02-10 NOTE — Progress Notes (Signed)
PFT done today. 

## 2016-02-10 NOTE — Progress Notes (Signed)
Subjective:    Patient ID: Walter French, male    DOB: [birth year],    MRN: [id number]    Brief patient profile:  38 yowm quit smoking in 1997 p cabg did better p rehab x a few years and then gradually decline fxn level due to sob ? chf  then much worse since  dx es ca  chemo / RT but no 02 until admit hosp with acute worsening sob with resp failure/ ? Amiodarone toxicity    Admit date: 11/07/2015 Discharge date: 11/16/2015  Recommendations for Outpatient Follow-up:  1. Discharge to home with home health services, PT/OT/RN 2. F/u in one week in the heart failure clinic. Repeat BMP at next visit 3. F/u with pulmonology ASAP for new patient appointment regarding amiodarone toxicity 4. 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[place]) Active Problems:  Hyperlipidemia  Coronary artery disease-status post CABG  Automatic implantable cardioverter-defibrillator in situ  Systolic and diastolic CHF, chronic (HCC)  Gastroesophageal cancer ([place])  PAF- s/p DCCV this admission  Anemia of chronic disease  Hypothyroidism  Acute on chronic combined systolic (congestive) and diastolic (congestive) heart failure (HCC)  Chronic anticoagulation - Eliquis, CHADS2VASC=5  Hypotension  Cardiomyopathy, ischemic-30-35%  Malnutrition of moderate degree  Constipation  Adenocarcinoma of esophagus (HCC)   Wt Readings from Last 3 Encounters:  11/16/15 81.589 kg (179 lb 13.9 oz)  10/30/15 93.033 kg (205 lb 1.6 oz)  10/28/15 93.441 kg (206 lb)    History of present illness:   70 y/o male admitted 10/05/15 with Afib RVR CHad2Vasc2 score=5, Failed DCCV and intolerant BB and therefore on amiodarone and digoxin, CAd s/p CABG x3, chronic systolic heart failure with EF 30-35% 07/2015, H/o VT s/p [employer] ICD, adenoCa Ge-Junction 07/2015 s/p XRT and Taxol/carbioplain completing Rx 09/2015. He  was admitted due to hypoglycemia due to poor PO intake despite being taken off his insulin and being started on glucotrol.  Hospital Course:   Acute hypoxic respiratory failure due to acute on chronic systolic heart failure, EF 30-35% possible atypical pneumonia and amiodarone pulmonary toxicity. He received 7-days of XRT 2 months ago with carboplatin and paclitaxel, however, although XRT may have effected lung, the chemotherapeutic agents are not typical causes of pneumonitis. He was diuresed with Lasix 40 mg IV twice a day and his weight decreased from 86.6 kg to 81.6 kg on the date of discharge. Despite diuresis, he continued to have a persistent high oxygen requirement of 4-5 L. A CT of the chest demonstrated changes consistent with either atypical pneumonia or amiodarone toxicity. He was started on doxycycline for atypical pneumonia on 12/3 and had a small improvement in his breathing over the subsequent 2 days. Despite his improvement on doxycycline, I suspect that he likely has amiodarone toxicity. His ESR was 125. He was not started on steroids secondary to his acute on chronic systolic heart failure. He required 3 L of oxygen at the time of discharge.  Atrial fibrillation s/p successful DCCV on 11/09/2015. He was continued on Apixaban due to [place] score= 5, however his amiodarone was discontinued secondary to pulmonary toxicity. Cardiology recommended Tikosyn should his tachycardia recur.   Abdominal pain likely secondary to constipation and resolved with bowel regimen  Hypoglycemia due to malignancy associated weight loss. His hemoglobin A1c was 5.6 he met with the nutritionist and was encouraged to use supplements. His cortisol level was 22 wnl. TSH was 0.588 wnl.  Hypothyroidism, stable, continue  levothyroxine 80mg daily and repeat TSH in late December.   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 Completed treatment 09/2015 with Taxol/carboplatin and  XRT and CT scan demonstrated improvement in area at GE junction during this hospitalization.   HLD, stable, continue atorvastatin '40mg'$  daily  Gout stable, continue allopurinol '300mg'$  daily  Hypokalemia due to lasix. Continued daily potassium  Hyponatremia due to heart failure, stable.  Generalized weakness. PT/OT recommending SNF, however, patient declined and requested to go home. He will go home with 24 hour supervision and home health services.   Consultants:   Cardiology  Diabetic educator  Procedures:  DCCV on 11/28  CT chest on 12/3  Antibiotics:  none        11[place]20/2015 [facility] [place]   Chief Complaint  Patient presents with  . Pulmonary Consult    Former patient of Dr. SHalford Chessman Pt c/o SOB "all my life" worse recently since started on Amiodarone approx 2 months ago. Med was stopped on 11/14/15.   He states he is fine at rest, but any exertion makes him feel SOB- such as just walking from room to room at home. He also c/o chest tightness and non prod cough.   d/c on 3lpm ok room to room and no worse since d/c   And min  assoc cough / still some dysphagia  Was d/c on pred but tapered off. On amio since 10/10/15 but d/c 11/26/15  rec Please schedule a follow up office visit in 4 weeks, sooner if needed with cxr on return     12/22/2015  f/u ov/Wert re: amio assoc ILD / hypoxemia > pt stopped 02 on his own / no resp rx at all at this point  Chief Complaint  Patient presents with  . Follow-up    Pt states that his breathing is much improved. He has had minimal cough and chest tightness. Cough is prod with minimal white sputum.    no longer using any 02 / Not limited by breathing from desired activities   rec No change rx   02/10/2016  f/u ov/Wert re: pf/ ? All amio related off since 11/26/15  Chief Complaint  Patient presents with  . Follow-up    PFT done today. He states his breathing is unchanged. He is no longer coughing and denies  any chest tightness.   denies  limited by breathing from desired activities  - " I've always breathed like this "  No obvious day to day or daytime variability or assoc excess/ purulent sputum or mucus plugs   or cp or chest tightness, subjective wheeze or overt sinus or hb symptoms. No unusual exp hx or h/o childhood pna/ asthma or knowledge of premature birth.  Sleeping ok without nocturnal  or early am exacerbation  of respiratory  c/o's or need for noct saba. Also denies any obvious fluctuation of symptoms with weather or environmental changes or other aggravating or alleviating factors except as outlined above   Current Medications, Allergies, Complete Past Medical History, Past Surgical History, Family History, and Social History were reviewed in [employer].  ROS  The following are not active complaints unless bolded sore throat, dysphagia, dental problems, itching, sneezing,  nasal congestion or excess/ purulent secretions, ear ache,   fever, chills, sweats, unintended wt loss, classically pleuritic or exertional cp, hemoptysis,  orthopnea pnd or leg swelling, presyncope, palpitations, abdominal pain, anorexia, nausea, vomiting, diarrhea  or change in bowel or bladder  habits, change in stools or urine, dysuria,hematuria,  rash, arthralgias, visual complaints, headache, numbness, weakness or ataxia or problems with walking or coordination,  change in mood/affect or memory.             Objective:   Physical Exam  Pleasant amb wm  nad   02/10/2016          204   12/22/2015      192   11/20/15 185 lb (83.915 kg)  11/16/15 179 lb 13.9 oz (81.589 kg)  10/30/15 205 lb 1.6 oz (93.033 kg)    Vital signs reviewed   HEENT: nl   turbinates, and oropharynx. Nl external ear canals without cough reflex - edentulous / pos strabismus    NECK :  without JVD/Nodes/TM/ nl carotid upstrokes bilaterally   LUNGS: no acc muscle use,    a few insp crackles both bases/ no  exp wheeze    CV:  RRR  no s3 or murmur or increase in P2, no edema   ABD:  soft and nontender with nl inspiratory excursion in the supine position. No bruits or organomegaly, bowel sounds nl  MS:    ext warm without deformities, calf tenderness, cyanosis or clubbing No obvious joint restrictions   SKIN: warm and dry without lesions    NEURO:  alert, approp, nl sensorium with  no motor deficits        I personally reviewed images and agree with radiology impression as follows:  CT Chest   11/14/15 1. Interstitial and ground-glass airspace opacities in the right more than left lungs. Often this is from asymmetric/resolving edema or atypical infection, but in this case there is beaded appearance of right lower lobe bronchi suggesting a fibrotic process and inflammatory pneumonia. Question drug toxicity (especially from the patient's amiodarone use). 2. Treated esophageal cancer with stable lower esophageal thickening. Periesophageal nodal size is decreased from September 2016.    CXR PA and Lateral:   12/22/2015 :    I personally reviewed images and agree with radiology impression as follows:   Persistent mild interstitial prominence of both lungs. Stable cardiomegaly without pulmonary edema.   Labs 02/10/2016  Lab Results  Component Value Date   WBC 5.2 02/10/2016   HGB 10.6* 02/10/2016   HCT 31.4* 02/10/2016   MCV 88.5 02/10/2016   PLT 119.0* 02/10/2016    Lab Results  Component Value Date   ESRSEDRATE 54* 02/10/2016   ESRSEDRATE 85* 12/22/2015   ESRSEDRATE 125* 11/16/2015       Assessment & Plan:

## 2016-02-10 NOTE — Patient Instructions (Addendum)
Please remember to go to the lab  department downstairs for your tests - we will call you with the results when they are available.  Please schedule a follow up visit in 3 months but call sooner if needed with cxr on return  - add repeat cbc also if not done in meantime

## 2016-02-11 ENCOUNTER — Encounter: Payer: Self-pay | Admitting: Internal Medicine

## 2016-02-11 DIAGNOSIS — D61818 Other pancytopenia: Secondary | ICD-10-CM | POA: Insufficient documentation

## 2016-02-11 NOTE — Assessment & Plan Note (Signed)
  Lab Results  Component Value Date   HGB 10.6* 02/10/2016   HGB 10.9* 12/22/2015   HGB 10.9* 11/16/2015   HGB 9.4* 10/05/2015   HGB 10.0* 09/21/2015   HGB 12.6* 09/09/2015     Lab Results  Component Value Date   PLT 119.0* 02/10/2016   PLT 152.0 12/22/2015   PLT 168 11/16/2015   PLT 95* 10/05/2015   PLT 95* 09/21/2015   PLT 68* 09/09/2015     WBC's ok but it does appear the hgb and plt trending back down > f/u Dr Benay Spice prn

## 2016-02-11 NOTE — Assessment & Plan Note (Addendum)
Amiodarone rx 10/10/15 - 11/16/15 with ESR up to 125 on 11/16/15 and down to 85 12/22/2015 s rx with steroids  - 12/22/2015  Walked RA x 3 laps @ 185 ft each stopped due to End of study, nl pace, no sob or desat  > d/c all 02  - PFT's  02/10/2016  VC 2.81 57%  s any sign airflow obst with DLCO  29/36  % corrects to 63  % for alv volume    ESR's trending down nicely s further prednisone rx and he has no symptoms despite impressive reductions in lung vol and dlco  I had an extended discussion with the patient reviewing all relevant studies completed to date and  lasting 15 to 20 minutes of a 25 minute visit on the following ongoing concerns:   Discussed in detail all the  indications, usual  risks and alternatives  relative to the benefits with patient who agrees to proceed with conservative f/u as outlined    Each maintenance medication was reviewed in detail including most importantly the difference between maintenance and as needed and under what circumstances the prns are to be used.  Please see instructions for details which were reviewed in writing and the patient given a copy.

## 2016-02-17 ENCOUNTER — Encounter (HOSPITAL_COMMUNITY): Payer: Self-pay | Admitting: Family Medicine

## 2016-02-17 ENCOUNTER — Inpatient Hospital Stay (HOSPITAL_COMMUNITY)
Admission: EM | Admit: 2016-02-17 | Discharge: 2016-02-19 | DRG: 374 | Disposition: A | Discharge status: Home / Self Care | Payer: PPO | Attending: Internal Medicine | Admitting: Internal Medicine

## 2016-02-17 DIAGNOSIS — Z923 Personal history of irradiation: Secondary | ICD-10-CM | POA: Diagnosis not present

## 2016-02-17 DIAGNOSIS — M109 Gout, unspecified: Secondary | ICD-10-CM | POA: Diagnosis not present

## 2016-02-17 DIAGNOSIS — I130 Hypertensive heart and chronic kidney disease with heart failure and stage 1 through stage 4 chronic kidney disease, or unspecified chronic kidney disease: Secondary | ICD-10-CM | POA: Diagnosis not present

## 2016-02-17 DIAGNOSIS — R195 Other fecal abnormalities: Secondary | ICD-10-CM | POA: Diagnosis not present

## 2016-02-17 DIAGNOSIS — I255 Ischemic cardiomyopathy: Secondary | ICD-10-CM | POA: Diagnosis present

## 2016-02-17 DIAGNOSIS — Z885 Allergy status to narcotic agent status: Secondary | ICD-10-CM

## 2016-02-17 DIAGNOSIS — Z95810 Presence of automatic (implantable) cardiac defibrillator: Secondary | ICD-10-CM

## 2016-02-17 DIAGNOSIS — Z881 Allergy status to other antibiotic agents status: Secondary | ICD-10-CM | POA: Diagnosis not present

## 2016-02-17 DIAGNOSIS — K922 Gastrointestinal hemorrhage, unspecified: Secondary | ICD-10-CM | POA: Diagnosis not present

## 2016-02-17 DIAGNOSIS — E1151 Type 2 diabetes mellitus with diabetic peripheral angiopathy without gangrene: Secondary | ICD-10-CM

## 2016-02-17 DIAGNOSIS — F419 Anxiety disorder, unspecified: Secondary | ICD-10-CM | POA: Diagnosis present

## 2016-02-17 DIAGNOSIS — E1122 Type 2 diabetes mellitus with diabetic chronic kidney disease: Secondary | ICD-10-CM | POA: Diagnosis present

## 2016-02-17 DIAGNOSIS — N183 Chronic kidney disease, stage 3 (moderate): Secondary | ICD-10-CM | POA: Diagnosis not present

## 2016-02-17 DIAGNOSIS — K2971 Gastritis, unspecified, with bleeding: Secondary | ICD-10-CM | POA: Diagnosis present

## 2016-02-17 DIAGNOSIS — D62 Acute posthemorrhagic anemia: Secondary | ICD-10-CM | POA: Diagnosis present

## 2016-02-17 DIAGNOSIS — I70201 Unspecified atherosclerosis of native arteries of extremities, right leg: Secondary | ICD-10-CM | POA: Diagnosis not present

## 2016-02-17 DIAGNOSIS — C159 Malignant neoplasm of esophagus, unspecified: Secondary | ICD-10-CM | POA: Diagnosis present

## 2016-02-17 DIAGNOSIS — E039 Hypothyroidism, unspecified: Secondary | ICD-10-CM

## 2016-02-17 DIAGNOSIS — D696 Thrombocytopenia, unspecified: Secondary | ICD-10-CM | POA: Diagnosis not present

## 2016-02-17 DIAGNOSIS — Z9221 Personal history of antineoplastic chemotherapy: Secondary | ICD-10-CM

## 2016-02-17 DIAGNOSIS — K208 Other esophagitis without bleeding: Secondary | ICD-10-CM | POA: Insufficient documentation

## 2016-02-17 DIAGNOSIS — E118 Type 2 diabetes mellitus with unspecified complications: Secondary | ICD-10-CM | POA: Diagnosis not present

## 2016-02-17 DIAGNOSIS — Z79899 Other long term (current) drug therapy: Secondary | ICD-10-CM | POA: Diagnosis not present

## 2016-02-17 DIAGNOSIS — Z91048 Other nonmedicinal substance allergy status: Secondary | ICD-10-CM | POA: Diagnosis not present

## 2016-02-17 DIAGNOSIS — Z8501 Personal history of malignant neoplasm of esophagus: Secondary | ICD-10-CM

## 2016-02-17 DIAGNOSIS — G4733 Obstructive sleep apnea (adult) (pediatric): Secondary | ICD-10-CM | POA: Diagnosis present

## 2016-02-17 DIAGNOSIS — K2970 Gastritis, unspecified, without bleeding: Secondary | ICD-10-CM | POA: Diagnosis not present

## 2016-02-17 DIAGNOSIS — E785 Hyperlipidemia, unspecified: Secondary | ICD-10-CM | POA: Diagnosis not present

## 2016-02-17 DIAGNOSIS — Z7901 Long term (current) use of anticoagulants: Secondary | ICD-10-CM | POA: Diagnosis not present

## 2016-02-17 DIAGNOSIS — I2510 Atherosclerotic heart disease of native coronary artery without angina pectoris: Secondary | ICD-10-CM | POA: Diagnosis not present

## 2016-02-17 DIAGNOSIS — K2960 Other gastritis without bleeding: Secondary | ICD-10-CM | POA: Insufficient documentation

## 2016-02-17 DIAGNOSIS — Z8709 Personal history of other diseases of the respiratory system: Secondary | ICD-10-CM

## 2016-02-17 DIAGNOSIS — I480 Paroxysmal atrial fibrillation: Secondary | ICD-10-CM | POA: Diagnosis present

## 2016-02-17 DIAGNOSIS — I5042 Chronic combined systolic (congestive) and diastolic (congestive) heart failure: Secondary | ICD-10-CM | POA: Diagnosis present

## 2016-02-17 DIAGNOSIS — G8929 Other chronic pain: Secondary | ICD-10-CM | POA: Diagnosis not present

## 2016-02-17 DIAGNOSIS — Z87891 Personal history of nicotine dependence: Secondary | ICD-10-CM | POA: Diagnosis not present

## 2016-02-17 DIAGNOSIS — Z951 Presence of aortocoronary bypass graft: Secondary | ICD-10-CM

## 2016-02-17 DIAGNOSIS — C160 Malignant neoplasm of cardia: Principal | ICD-10-CM | POA: Diagnosis present

## 2016-02-17 DIAGNOSIS — D500 Iron deficiency anemia secondary to blood loss (chronic): Secondary | ICD-10-CM

## 2016-02-17 DIAGNOSIS — Y842 Radiological procedure and radiotherapy as the cause of abnormal reaction of the patient, or of later complication, without mention of misadventure at the time of the procedure: Secondary | ICD-10-CM | POA: Diagnosis present

## 2016-02-17 DIAGNOSIS — Z794 Long term (current) use of insulin: Secondary | ICD-10-CM | POA: Diagnosis not present

## 2016-02-17 DIAGNOSIS — E1129 Type 2 diabetes mellitus with other diabetic kidney complication: Secondary | ICD-10-CM | POA: Diagnosis present

## 2016-02-17 DIAGNOSIS — M545 Low back pain: Secondary | ICD-10-CM | POA: Diagnosis not present

## 2016-02-17 DIAGNOSIS — R300 Dysuria: Secondary | ICD-10-CM | POA: Diagnosis not present

## 2016-02-17 DIAGNOSIS — Z8249 Family history of ischemic heart disease and other diseases of the circulatory system: Secondary | ICD-10-CM

## 2016-02-17 DIAGNOSIS — I4891 Unspecified atrial fibrillation: Secondary | ICD-10-CM

## 2016-02-17 DIAGNOSIS — K921 Melena: Secondary | ICD-10-CM | POA: Diagnosis not present

## 2016-02-17 LAB — COMPREHENSIVE METABOLIC PANEL
ALT: 8 U/L — ABNORMAL LOW (ref 17–63)
ANION GAP: 8 (ref 5–15)
AST: 22 U/L (ref 15–41)
Albumin: 3 g/dL — ABNORMAL LOW (ref 3.5–5.0)
Alkaline Phosphatase: 164 U/L — ABNORMAL HIGH (ref 38–126)
BUN: 15 mg/dL (ref 6–20)
CHLORIDE: 110 mmol/L (ref 101–111)
CO2: 23 mmol/L (ref 22–32)
Calcium: 9.1 mg/dL (ref 8.9–10.3)
Creatinine, Ser: 1.09 mg/dL (ref 0.61–1.24)
Glucose, Bld: 113 mg/dL — ABNORMAL HIGH (ref 65–99)
POTASSIUM: 4 mmol/L (ref 3.5–5.1)
SODIUM: 141 mmol/L (ref 135–145)
Total Bilirubin: 0.5 mg/dL (ref 0.3–1.2)
Total Protein: 5.7 g/dL — ABNORMAL LOW (ref 6.5–8.1)

## 2016-02-17 LAB — PROTIME-INR
INR: 1.34 (ref 0.00–1.49)
Prothrombin Time: 16.7 seconds — ABNORMAL HIGH (ref 11.6–15.2)

## 2016-02-17 LAB — POC OCCULT BLOOD, ED: FECAL OCCULT BLD: POSITIVE — AB

## 2016-02-17 LAB — CBC
HEMATOCRIT: 26.8 % — AB (ref 39.0–52.0)
HEMOGLOBIN: 8.5 g/dL — AB (ref 13.0–17.0)
MCH: 29.1 pg (ref 26.0–34.0)
MCHC: 31.7 g/dL (ref 30.0–36.0)
MCV: 91.8 fL (ref 78.0–100.0)
Platelets: 99 10*3/uL — ABNORMAL LOW (ref 150–400)
RBC: 2.92 MIL/uL — AB (ref 4.22–5.81)
RDW: 16.8 % — ABNORMAL HIGH (ref 11.5–15.5)
WBC: 4.6 10*3/uL (ref 4.0–10.5)

## 2016-02-17 MED ORDER — PANTOPRAZOLE SODIUM 40 MG IV SOLR
40.0000 mg | Freq: Once | INTRAVENOUS | Status: AC
  Administered 2016-02-18: 40 mg via INTRAVENOUS
  Filled 2016-02-17: qty 40

## 2016-02-17 NOTE — ED Provider Notes (Signed)
CSN: [id number]     Arrival date & time 02/17/16  1706 History   First MD Initiated Contact with Patient 02/17/16 2122     Chief Complaint  Patient presents with  . Rectal Bleeding    Patient is a 70 y.o. male presenting with hematochezia. The history is provided by the patient and a relative.  Rectal Bleeding Quality:  Black and tarry Amount:  Moderate Duration:  5 weeks Timing:  Intermittent Progression:  Worsening Chronicity:  New Relieved by:  None tried Worsened by:  Nothing tried Associated symptoms: no abdominal pain, no fever and no hematemesis   Associated symptoms comment:  Weakness  Risk factors: anticoagulant use    Pt reports he has had intermittent dark stool for weeks Seen by PCP who did labs that showed anemia He is also on eliquis for a-fib  Pt reports weakness but no other complaints Past Medical History  Diagnosis Date  . Hyperlipidemia   . Hypertension   . Coronary heart disease     a. s/p CABG x 3 (VG->PDA, VG->PL, LIMA->LAD);  b. 2013 Abnl Myoview;  c. 2013 Cath: 3/3 patent grafts, occluded LCX which correlated w/ ischemia on MV->not amenable to PCI->Med Rx.  . Obesity   . CHF (congestive heart failure) ([place])   . VT (ventricular tachycardia) ([place])   . SVT (supraventricular tachycardia) ([place])   . PAD (peripheral artery disease) ([place])     Angiography in February of 2014: Moderate left iliac artery stenosis, mild to moderate diffuse right SFA disease. Severe proximal right popliteal artery stenosis with three-vessel runoff below the knee. Status post self-expanding stent placement to the proximal popliteal artery  . Implantable cardioverter-defibrillator Mdt   . Asthma     "small touch" (07/29/2013)  . History of pneumonia     "3-4 times; last time ~ 2003" (07/29/2013)  . OSA on CPAP   . GERD (gastroesophageal reflux disease)   . H/O hiatal hernia   . Gout   . Anxiety   . Allergy   . Stomach cancer ([place]) 07/23/2015    invasive adenocarcinoma  ,esophagus ,distal   . Type II diabetes mellitus ([place])     on insulin  . Sleep apnea   . Hyperlipidemia   . PAD (peripheral artery disease) ([place])   . CHF (congestive heart failure) ([place])   . A-fib ([place])     a. 11/09/2015: successful DCCV  . CKD (chronic kidney disease), stage III   . S/P radiation therapy 08/12/15-09/24/15    Ge  junction 54 Gy   Past Surgical History  Procedure Laterality Date  . Tonsillectomy    . Cardiac catheterization    . Wrist fracture surgery Right   . Cataract extraction w/ intraocular lens implant Left   . Cardiac defibrillator placement  1997; ~ 2003  . Coronary artery bypass graft  1997; 2007    "X3" (07/29/2013)  . Nerve, tendon and artery repair Left 09/20/2013    Procedure: IRRIGATION AND DEBRIDEMENT,Exploration and Repair of Ulnar/Digital  Artery Nerve of Left Index finger;  Surgeon: Roseanne Kaufman, MD;  Location: [place];  Service: Orthopedics;  Laterality: Left;  . Abdominal aortagram N/A 01/30/2013    Procedure: ABDOMINAL Maxcine Ham;  Surgeon: Wellington Hampshire, MD;  Location[facility] C[facility] LAB;  Service: Cardiovascular;  Laterality: N/A;  . Cardiac catheterization N/A 07/22/2015    Procedure: Left Heart Cath and Coronary Angiography;  Surgeon: Peter M Martinique, MD;  Location: [place] CV LAB;  Service: Cardiovascular;  Laterality: N/A;  .  Esophagogastroduodenoscopy N/A 07/23/2015    Procedure: ESOPHAGOGASTRODUODENOSCOPY (EGD);  Surgeon: Wonda Horner, MD;  Location[facility] ENDOSCOPY;  Service: Endoscopy;  Laterality: N/A;  . Tee without cardioversion N/A 10/06/2015    Procedure: TRANSESOPHAGEAL ECHOCARDIOGRAM (TEE);  Surgeon: Pixie Casino, MD;  Location: [facility] ENDOSCOPY;  Service: Cardiovascular;  Laterality: N/A;  . Cardioversion N/A 11/09/2015    Procedure: CARDIOVERSION;  Surgeon: Pixie Casino, MD;  Location: [facility] ENDOSCOPY;  Service: Cardiovascular;  Laterality: N/A;   Family History  Problem Relation Age of Onset  . Heart disease Brother   . Cancer  Brother   . Breast cancer Mother    Social History  Substance Use Topics  . Smoking status: Former Smoker -- 2.00 packs/day for 30 years    Types: Cigarettes    Quit date: 12/13/1995  . Smokeless tobacco: Never Used  . Alcohol Use: No     Comment: 07/29/2013 "not touched any alcohol since 1997"    Review of Systems  Constitutional: Positive for fatigue. Negative for fever.  Cardiovascular: Negative for chest pain.  Gastrointestinal: Positive for hematochezia. Negative for abdominal pain and hematemesis.  Neurological: Negative for syncope.  All other systems reviewed and are negative.     Allergies  Adhesive; Levaquin; and Morphine and related  Home Medications   Prior to Admission medications   Medication Sig Start Date End Date Taking? Authorizing Provider  allopurinol (ZYLOPRIM) 300 MG tablet Take 300 mg by mouth daily.      Historical Provider, MD  apixaban (ELIQUIS) 5 MG TABS tablet Take 1 tablet (5 mg total) by mouth 2 (two) times daily. 11/04/15   Larey Dresser, MD  atorvastatin (LIPITOR) 40 MG tablet Take 1 tablet (40 mg total) by mouth daily. 03/11/15   Deboraha Sprang, MD  carvedilol (COREG) 6.25 MG tablet take 1 tablet by mouth twice a day with food 12/15/15   Peter M Martinique, MD  digoxin (LANOXIN) 0.125 MG tablet Take 1 tablet (0.125 mg total) by mouth daily. 10/20/15   Peter M Martinique, MD  furosemide (LASIX) 40 MG tablet Take 1 tablet (40 mg total) by mouth daily. 11/16/15   Janece Canterbury, MD  HYDROcodone-acetaminophen (NORCO/VICODIN) 5-325 MG tablet Take 1 tablet by mouth every 6 (six) hours as needed. 01/22/16 02/21/16  Historical Provider, MD  levothyroxine (SYNTHROID, LEVOTHROID) 75 MCG tablet Take 75 mcg by mouth daily before breakfast.  12/06/14   Historical Provider, MD  LORazepam (ATIVAN) 0.5 MG tablet Take 1 tablet (0.5 mg total) by mouth every 8 (eight) hours. Patient taking differently: Take 0.5 mg by mouth every 8 (eight) hours as needed.  11/16/15   Janece Canterbury, MD  nitroGLYCERIN (NITROSTAT) 0.4 MG SL tablet Place 1 tablet (0.4 mg total) under the tongue every 5 (five) minutes x 3 doses as needed for chest pain. 10/03/14   Peter M Martinique, MD  potassium chloride SA (K-DUR,KLOR-CON) 20 MEQ tablet Take 1 tablet (20 mEq total) by mouth daily. 10/10/15   Brett Canales, PA-C  ranitidine (ZANTAC) 300 MG tablet Take 300 mg by mouth daily.    Historical Provider, MD   BP 115/55 mmHg  Pulse 84  Temp(Src) 98.1 F (36.7 C) (Oral)  Resp 27  SpO2 100% Physical Exam CONSTITUTIONAL: Well developed/well nourished HEAD: Normocephalic/atraumatic EYES: EOMI ENMT: Mucous membranes moist NECK: supple no meningeal signs SPINE/BACK:entire spine nontender CV: S1/S2 noted, no murmurs/rubs/gallops noted LUNGS: Lungs are clear to auscultation bilaterally, no apparent distress ABDOMEN: soft, nontender, no rebound or  guarding, bowel sounds noted throughout abdomen Rectal - stool color black, chaperone present, hemoccult + NEURO: Pt is awake/alert/appropriate, moves all extremitiesx4.  No facial droop.   EXTREMITIES: pulses normal/equal, full ROM SKIN: warm, color normal PSYCH: no abnormalities of mood noted, alert and oriented to situation  ED Course  Procedures  10:22 PM Pt with h/o esophageal/gastric CA, has been on chemotherapy but no operative management Suspect this may be source of his bleed He is currently stable Will need admission 11:03 PM Internal medicine resident Will admit Pt stable at this time Will defer blood products for now Labs Review Labs Reviewed  COMPREHENSIVE METABOLIC PANEL - Abnormal; Notable for the following:    Glucose, Bld 113 (*)    Total Protein 5.7 (*)    Albumin 3.0 (*)    ALT 8 (*)    Alkaline Phosphatase 164 (*)    All other components within normal limits  CBC - Abnormal; Notable for the following:    RBC 2.92 (*)    Hemoglobin 8.5 (*)    HCT 26.8 (*)    RDW 16.8 (*)    Platelets 99 (*)    All other  components within normal limits  PROTIME-INR - Abnormal; Notable for the following:    Prothrombin Time 16.7 (*)    All other components within normal limits  POC OCCULT BLOOD, ED - Abnormal; Notable for the following:    Fecal Occult Bld POSITIVE (*)    All other components within normal limits  POC OCCULT BLOOD, ED  TYPE AND SCREEN   I have personally reviewed and evaluated these  lab results as part of my medical decision-making.   Medications  pantoprazole (PROTONIX) injection 40 mg (not administered)    MDM   Final diagnoses:  Gastrointestinal hemorrhage, unspecified gastritis, unspecified gastrointestinal hemorrhage type  Anemia due to blood loss    Nursing notes including past medical history and social history reviewed and considered in documentation Labs/vital reviewed myself and considered during evaluation     Ripley Fraise, MD 02/17/16 2304

## 2016-02-17 NOTE — H&P (Signed)
Date: 02/17/2016               Patient Name:  Walter French MRN: [id number]  DOB: [birth year] Age / Sex: 70 y.o., male   PCP: Karlene Einstein, MD         Medical Service: Internal Medicine Teaching Service         Attending Physician: Dr. Ripley Fraise, MD    First Contact: Dr. Lindon Romp Pager: [phone]  Second Contact: Dr. 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 Fib on Eliquis, CAD s/p CABG, PAD, DM type II, OSA, Gout, HTN, HLD, Obesity presenting today with melena. States that he has been having dark black tarry stool for the past 2 months. He reports he saw his PCP today and was told his Hgb was 7.0 and to come to the ED for evaluation. He denies any abdominal pain, nausea/vomiting, hematochezia, hematemesis, chest pain or fatigue. Reports shortness of breath is unchanged from baseline. Denies any NSAID use. Patient is on Eliquis for A. Fib. Patient was diagnosed with adenocarcinoma of the gastroesophageal junction in 07/2015. He underwent radiation therapy as well as taxol/carboplatin chemotherapy and completed treatment in October.   Meds: Current Facility-Administered Medications  Medication Dose Route Frequency Provider Last Rate Last Dose  . pantoprazole (PROTONIX) injection 40 mg  40 mg Intravenous Once Ripley Fraise, MD       Current Outpatient Prescriptions  Medication Sig Dispense Refill  . allopurinol (ZYLOPRIM) 300 MG tablet Take 300 mg by mouth daily.      [name] apixaban (ELIQUIS) 5 MG TABS tablet Take 1 tablet (5 mg total) by mouth 2 (two) times daily. 60 tablet 11  . atorvastatin (LIPITOR) 40 MG tablet Take 1 tablet (40 mg total) by mouth daily. 90 tablet 3  .  carvedilol (COREG) 6.25 MG tablet take 1 tablet by mouth twice a day with food 60 tablet 3  . digoxin (LANOXIN) 0.125 MG tablet Take 1 tablet (0.125 mg total) by mouth daily. 30 tablet 6  . furosemide (LASIX) 40 MG tablet Take 1 tablet (40 mg total) by mouth daily. 30 tablet 0  . levothyroxine (SYNTHROID, LEVOTHROID) 75 MCG tablet Take 75 mcg by mouth daily before breakfast.   0  . potassium chloride SA (K-DUR,KLOR-CON) 20 MEQ tablet Take 1 tablet (20 mEq total) by mouth daily. 30 tablet 11  . ranitidine (ZANTAC) 300 MG tablet Take 300 mg by mouth daily.    [name] HYDROcodone-acetaminophen (NORCO/VICODIN) 5-325 MG tablet Take 1 tablet by mouth every 6 (six) hours as needed.    [name] LORazepam (ATIVAN) 0.5 MG tablet Take 1 tablet (0.5 mg total) by mouth every 8 (eight) hours. (Patient taking differently: Take 0.5 mg by mouth every 8 (eight) hours as needed. ) 30 tablet 0  . nitroGLYCERIN (NITROSTAT) 0.4 MG SL tablet Place 1 tablet (0.4 mg total) under the tongue every 5 (five) minutes x 3 doses as needed for chest pain. 25 tablet 3    Allergies: Allergies as of 02/17/2016 - Review Complete 02/17/2016  Allergen Reaction Noted  . Adhesive [tape] Other (See Comments) 09/20/2013  . Levaquin [levofloxacin in d5w] Nausea And Vomiting 09/14/2015  .  Morphine and related Nausea And Vomiting 06/09/2011   Past Medical History  Diagnosis Date  . Hyperlipidemia   . Hypertension   . Coronary heart disease     a. s/p CABG x 3 (VG->PDA, VG->PL, LIMA->LAD);  b. 2013 Abnl Myoview;  c. 2013 Cath: 3/3 patent grafts, occluded LCX which correlated w/ ischemia on MV->not amenable to PCI->Med Rx.  . Obesity   . CHF (congestive heart failure) ([place])   . VT (ventricular tachycardia) ([place])   . SVT (supraventricular tachycardia) ([place])   . PAD (peripheral artery disease) ([place])     Angiography in February of 2014: Moderate left iliac artery stenosis, mild to moderate diffuse right SFA disease. Severe proximal right popliteal  artery stenosis with three-vessel runoff below the knee. Status post self-expanding stent placement to the proximal popliteal artery  . Implantable cardioverter-defibrillator Mdt   . Asthma     "small touch" (07/29/2013)  . History of pneumonia     "3-4 times; last time ~ 2003" (07/29/2013)  . OSA on CPAP   . GERD (gastroesophageal reflux disease)   . H/O hiatal hernia   . Gout   . Anxiety   . Allergy   . Stomach cancer ([place]) 07/23/2015    invasive adenocarcinoma ,esophagus ,distal   . Type II diabetes mellitus ([place])     on insulin  . Sleep apnea   . Hyperlipidemia   . PAD (peripheral artery disease) ([place])   . CHF (congestive heart failure) ([place])   . A-fib ([place])     a. 11/09/2015: successful DCCV  . CKD (chronic kidney disease), stage III   . S/P radiation therapy 08/12/15-09/24/15    Ge  junction 54 Gy   Past Surgical History  Procedure Laterality Date  . Tonsillectomy    . Cardiac catheterization    . Wrist fracture surgery Right   . Cataract extraction w/ intraocular lens implant Left   . Cardiac defibrillator placement  1997; ~ 2003  . Coronary artery bypass graft  1997; 2007    "X3" (07/29/2013)  . Nerve, tendon and artery repair Left 09/20/2013    Procedure: IRRIGATION AND DEBRIDEMENT,Exploration and Repair of Ulnar/Digital  Artery Nerve of Left Index finger;  Surgeon: Roseanne Kaufman, MD;  Location: [facility];  Service: Orthopedics;  Laterality: Left;  . Abdominal aortagram N/A 01/30/2013    Procedure: ABDOMINAL Maxcine Ham;  Surgeon: Wellington Hampshire, MD;  Location: [facility] CATH LAB;  Service: Cardiovascular;  Laterality: N/A;  . Cardiac catheterization N/A 07/22/2015    Procedure: Left Heart Cath and Coronary Angiography;  Surgeon: Peter M Martinique, MD;  Location: [place] CV LAB;  Service: Cardiovascular;  Laterality: N/A;  . Esophagogastroduodenoscopy N/A 07/23/2015    Procedure: ESOPHAGOGASTRODUODENOSCOPY (EGD);  Surgeon: Wonda Horner, MD;  Location[facility] ENDOSCOPY;  Service:  Endoscopy;  Laterality: N/A;  . Tee without cardioversion N/A 10/06/2015    Procedure: TRANSESOPHAGEAL ECHOCARDIOGRAM (TEE);  Surgeon: Pixie Casino, MD;  Location: [facility] ENDOSCOPY;  Service: Cardiovascular;  Laterality: N/A;  . Cardioversion N/A 11/09/2015    Procedure: CARDIOVERSION;  Surgeon: Pixie Casino, MD;  Location: [facility] ENDOSCOPY;  Service: Cardiovascular;  Laterality: N/A;   Family History  Problem Relation Age of Onset  . Heart disease Brother   . Cancer Brother   . Breast cancer Mother    Social History   Social History  . Marital Status: Single    Spouse Name: N/A  . Number of Children: 0  . Years of Education: N/A  Occupational History  . retired Surveyor, mining   .     Social History Main Topics  . Smoking status: Former Smoker -- 2.00 packs/day for 30 years    Types: Cigarettes    Quit date: 12/13/1995  . Smokeless tobacco: Never Used  . Alcohol Use: No     Comment: 07/29/2013 "not touched any alcohol since 1997"  . Drug Use: No  . Sexual Activity: Not Currently   Other Topics Concern  . Not on file   Social History Narrative   Single, never married   Retired Administrator   Close friend, [name] and her children are his main support system   Independent in ADLs.    Review of Systems: Pertinent items noted in HPI and remainder of comprehensive ROS otherwise negative.  Physical Exam: Blood pressure 108/56, pulse 88, temperature 98.1 F (36.7 C), temperature source Oral, resp. rate 31, SpO2 100 %. Physical Exam  General: alert, well-developed, and cooperative to examination.  Head: normocephalic and atraumatic.  Eyes: vision grossly intact, pupils equal, pupils round, pupils reactive to light, no injection and anicteric.  Mouth: pharynx pink and moist, no erythema, and no exudates.  Neck: supple, full ROM, no thyromegaly, no JVD, and no carotid bruits.  Lungs: normal respiratory effort, no accessory muscle use, normal breath sounds, no  crackles, and no wheezes. Heart: normal rate, regular rhythm, no murmur, no gallop, and no rub.  Abdomen: soft, non-tender, normal bowel sounds, no distention, no guarding, no rebound tenderness Msk: no joint swelling, no joint warmth, and no redness over joints.  Pulses: 2+ DP/PT pulses bilaterally Extremities: 1+ pitting edema to knees bilaterally Neurologic: alert & oriented X3, cranial nerves II-XII intact, no focal deficits Skin: turgor normal and no rashes.  Psych: normal mood and affect  Lab results: Basic Metabolic Panel:  Recent Labs  02/17/16 1742  NA 141  K 4.0  CL 110  CO2 23  GLUCOSE 113*  BUN 15  CREATININE 1.09  CALCIUM 9.1   Liver Function Tests:  Recent Labs  02/17/16 1742  AST 22  ALT 8*  ALKPHOS 164*  BILITOT 0.5  PROT 5.7*  ALBUMIN 3.0*   CBC:  Recent Labs  02/17/16 1742  WBC 4.6  HGB 8.5*  HCT 26.8*  MCV 91.8  PLT 99*   Coagulation:  Recent Labs  02/17/16 1742  LABPROT 16.7*  INR 1.34   Imaging results:  No results found.  Assessment & Plan by Problem:  Upper GI Bleed: Patient with 2 month history of melena. Found to have Hgb of 7.0 by PCP today and sent for evaluation. Hgb was 8.5. Patient reports being asymptomatic with no increased shortness of breath from baseline, chest pain, dizziness, weakness or fatigue. No hematochezia. No abdominal pain or recent NSAID use. Patient is on Eliquis for PAF with last dose this morning. FOBT was positive in the ED. Patient with a history of esophageal adenocarcinoma treated with radiation and chemotherapy 07/2015-09/2015. Hgb has been stable 10-11 since his admission in November with hypoglycemia and amiodarone pulmonary toxicity. Last Hgb was 10.6 on 3/1. Most source of bleeding is from his esophageal adenocarcinoma. He is hemodynamically stable and asymptomatic in the ED. Will plan for GI consult in the morning for possible endoscopy. Has been seen by [facility] in the past.  -Type and  Screen -Transfuse Hgb <8 -Telemetry -NPO at midnight -Protonix 40 mg IV -CBC q8hr -Holding Eliquis, last dose 3/8 am -Hold Lasix -Consult GI in the am  Hx of Esophageal Adenocarcinoma s/p Radiation and Chemotherapy: [id number]. Patient had biopsy of a distal esophagus mass and gastric cardia ulcer in 07/2015 and found to have adenocarcinoma. PET staging showed hypermetabolic mass at the distal esophagus/GE junction and hypermetabolic paraesophageal, gastrohepatic and portacaval lymph nodes. He underwent radiation and chemotherapy with taxol/carboplatin with last cycle completing in 09/2015.  -Consider speaking with Oncology in the am  Thrombocytopenia: Platelets 99 on admission. Trending down from 1 month ago when they were 152. Patient with upper GI bleed currently. Will continue to monitor.  -CBC q8hr  DM Type II: Patient was admitted in 10/2015 with hypoglycemia down to the 20s despite recently being taken off insulin and transitioned to glipizide. He was stopped of all diabetic medications that time with his hypoglycemia attributed to his malignancy associated weight loss (86 lbs). HgbA1c was 5.6 at that time. His TSH and am cortisol were both wnl at that time. -monitor CBGs  History of Amiodarone Pulmonary Toxicity: Patient admitted in 10/2015 and found to have acute hypoxic respiratory failure. He has followed with Dr. Melvyn Novas. ESR up to 125 on 11/16/15 and down to 85 on 12/22/15 with treatment with steroids. PFTs done at 3/1 office visit with VC 2.81 57%without any sign of airflow obst with DLCO 29/36% corrects to 63% for alv volume.  Systolic and Diastolic CHF with ICD placement: Last ECHO 07/2015 with EF 30-35%, moderate LVH and grade 2 diastolic dysfunction. On lasix 40 mg daily and coreg 6.25 mg bid.   CAD s/p CABG: Stable. No chest pain or change in baseline shortness of breath. s/p CABG x 3 (VG->PDA, VG->PL, LIMA->LAD); Underwent left heart catheterization on 07/2015 which showed severe 3  vessel obstructive disease with patent grafts. Did have 50% stenosis of LIMA. He is currently on lasix 40 mg daily, Lipitor 40 mg daily, coreg 6.25 mg bid.  -Holding lasix per above -Continue Lipitor and coreg  PAD: Patient with proximal popliteal artery in 2014. ABIs in 12/2015 showed patent stent with borderline ABI on the right side at 0.93 and normal on the left.   Atrial Fibrillation s/p DCCV in 10/2015: Patient in NSR today. Patient currently takes Apixaban, Digoxin and Coreg as an outpatient. Previously on Amiodarone but discontinued secondary to pulmonary toxicity. Last dose of Apixaban was this morning 3/8. CHADS-VASc score is 5. Cardiology recommended Tikosyn should his tachycardia recur.  -Holding Apixaban for GI bleed -Continue Coreg and Digoxin  Hypothyroidism: TSH 0.588 10/2015. On Levothyroxine 75 mcg daily. -Continue home dose levothyroxine   Gout: Takes allopurinol 300 mg daily. -Continue home meds  HLD: Atorvastatin 40 mg daily -Continue home meds  DVT PPx: SCDs  CODE: FULL  Dispo: Disposition is deferred at this time, awaiting improvement of current medical problems. Anticipated discharge in approximately 2-3 day(s).   The patient does have a current PCP Karlene Einstein, MD) and does need an [facility] hospital follow-up appointment after discharge.  The patient does not have transportation limitations that hinder transportation to clinic appointments.  Signed: Maryellen Pile, MD 02/17/2016, 10:52 PM

## 2016-02-17 NOTE — ED Notes (Signed)
Pt here for rectal bleeding x 5 weeks. sts dark black stool. Sent here by his doctor for deceased hgb.

## 2016-02-18 ENCOUNTER — Encounter (HOSPITAL_COMMUNITY): Payer: Self-pay | Admitting: *Deleted

## 2016-02-18 ENCOUNTER — Encounter (HOSPITAL_COMMUNITY)
Admission: EM | Disposition: A | Discharge status: Home / Self Care | Payer: Self-pay | Source: Home / Self Care | Attending: Internal Medicine

## 2016-02-18 DIAGNOSIS — M545 Low back pain: Secondary | ICD-10-CM | POA: Diagnosis not present

## 2016-02-18 DIAGNOSIS — N183 Chronic kidney disease, stage 3 (moderate): Secondary | ICD-10-CM | POA: Diagnosis not present

## 2016-02-18 DIAGNOSIS — D500 Iron deficiency anemia secondary to blood loss (chronic): Secondary | ICD-10-CM | POA: Diagnosis not present

## 2016-02-18 DIAGNOSIS — E039 Hypothyroidism, unspecified: Secondary | ICD-10-CM | POA: Diagnosis not present

## 2016-02-18 DIAGNOSIS — E1151 Type 2 diabetes mellitus with diabetic peripheral angiopathy without gangrene: Secondary | ICD-10-CM | POA: Diagnosis not present

## 2016-02-18 DIAGNOSIS — K922 Gastrointestinal hemorrhage, unspecified: Secondary | ICD-10-CM | POA: Diagnosis not present

## 2016-02-18 DIAGNOSIS — E785 Hyperlipidemia, unspecified: Secondary | ICD-10-CM | POA: Diagnosis not present

## 2016-02-18 DIAGNOSIS — Z923 Personal history of irradiation: Secondary | ICD-10-CM | POA: Diagnosis not present

## 2016-02-18 DIAGNOSIS — K2970 Gastritis, unspecified, without bleeding: Secondary | ICD-10-CM | POA: Diagnosis not present

## 2016-02-18 DIAGNOSIS — E118 Type 2 diabetes mellitus with unspecified complications: Secondary | ICD-10-CM | POA: Diagnosis not present

## 2016-02-18 DIAGNOSIS — I5042 Chronic combined systolic (congestive) and diastolic (congestive) heart failure: Secondary | ICD-10-CM | POA: Diagnosis not present

## 2016-02-18 DIAGNOSIS — Z87891 Personal history of nicotine dependence: Secondary | ICD-10-CM | POA: Diagnosis not present

## 2016-02-18 DIAGNOSIS — Z885 Allergy status to narcotic agent status: Secondary | ICD-10-CM | POA: Diagnosis not present

## 2016-02-18 DIAGNOSIS — K208 Other esophagitis: Secondary | ICD-10-CM | POA: Diagnosis not present

## 2016-02-18 DIAGNOSIS — Z7901 Long term (current) use of anticoagulants: Secondary | ICD-10-CM | POA: Diagnosis not present

## 2016-02-18 DIAGNOSIS — C160 Malignant neoplasm of cardia: Secondary | ICD-10-CM | POA: Diagnosis not present

## 2016-02-18 DIAGNOSIS — Z951 Presence of aortocoronary bypass graft: Secondary | ICD-10-CM | POA: Diagnosis not present

## 2016-02-18 DIAGNOSIS — Z881 Allergy status to other antibiotic agents status: Secondary | ICD-10-CM | POA: Diagnosis not present

## 2016-02-18 DIAGNOSIS — I70201 Unspecified atherosclerosis of native arteries of extremities, right leg: Secondary | ICD-10-CM | POA: Diagnosis not present

## 2016-02-18 DIAGNOSIS — E119 Type 2 diabetes mellitus without complications: Secondary | ICD-10-CM

## 2016-02-18 DIAGNOSIS — R195 Other fecal abnormalities: Secondary | ICD-10-CM | POA: Diagnosis not present

## 2016-02-18 DIAGNOSIS — Z8249 Family history of ischemic heart disease and other diseases of the circulatory system: Secondary | ICD-10-CM | POA: Diagnosis not present

## 2016-02-18 DIAGNOSIS — Z95810 Presence of automatic (implantable) cardiac defibrillator: Secondary | ICD-10-CM | POA: Diagnosis not present

## 2016-02-18 DIAGNOSIS — K2971 Gastritis, unspecified, with bleeding: Secondary | ICD-10-CM | POA: Diagnosis not present

## 2016-02-18 DIAGNOSIS — Z955 Presence of coronary angioplasty implant and graft: Secondary | ICD-10-CM

## 2016-02-18 DIAGNOSIS — M109 Gout, unspecified: Secondary | ICD-10-CM | POA: Diagnosis not present

## 2016-02-18 DIAGNOSIS — D696 Thrombocytopenia, unspecified: Secondary | ICD-10-CM | POA: Diagnosis not present

## 2016-02-18 DIAGNOSIS — Y842 Radiological procedure and radiotherapy as the cause of abnormal reaction of the patient, or of later complication, without mention of misadventure at the time of the procedure: Secondary | ICD-10-CM | POA: Diagnosis not present

## 2016-02-18 DIAGNOSIS — Z8501 Personal history of malignant neoplasm of esophagus: Secondary | ICD-10-CM | POA: Diagnosis not present

## 2016-02-18 DIAGNOSIS — F419 Anxiety disorder, unspecified: Secondary | ICD-10-CM | POA: Diagnosis not present

## 2016-02-18 DIAGNOSIS — I130 Hypertensive heart and chronic kidney disease with heart failure and stage 1 through stage 4 chronic kidney disease, or unspecified chronic kidney disease: Secondary | ICD-10-CM | POA: Diagnosis not present

## 2016-02-18 DIAGNOSIS — R300 Dysuria: Secondary | ICD-10-CM | POA: Diagnosis not present

## 2016-02-18 DIAGNOSIS — E1122 Type 2 diabetes mellitus with diabetic chronic kidney disease: Secondary | ICD-10-CM | POA: Diagnosis not present

## 2016-02-18 DIAGNOSIS — Z91048 Other nonmedicinal substance allergy status: Secondary | ICD-10-CM | POA: Diagnosis not present

## 2016-02-18 DIAGNOSIS — I2510 Atherosclerotic heart disease of native coronary artery without angina pectoris: Secondary | ICD-10-CM | POA: Diagnosis not present

## 2016-02-18 DIAGNOSIS — D62 Acute posthemorrhagic anemia: Secondary | ICD-10-CM | POA: Diagnosis not present

## 2016-02-18 DIAGNOSIS — I255 Ischemic cardiomyopathy: Secondary | ICD-10-CM | POA: Diagnosis not present

## 2016-02-18 DIAGNOSIS — K921 Melena: Secondary | ICD-10-CM | POA: Diagnosis not present

## 2016-02-18 DIAGNOSIS — I480 Paroxysmal atrial fibrillation: Secondary | ICD-10-CM | POA: Diagnosis not present

## 2016-02-18 DIAGNOSIS — Z9221 Personal history of antineoplastic chemotherapy: Secondary | ICD-10-CM | POA: Diagnosis not present

## 2016-02-18 DIAGNOSIS — G8929 Other chronic pain: Secondary | ICD-10-CM | POA: Diagnosis not present

## 2016-02-18 DIAGNOSIS — Z794 Long term (current) use of insulin: Secondary | ICD-10-CM | POA: Diagnosis not present

## 2016-02-18 DIAGNOSIS — Z79899 Other long term (current) drug therapy: Secondary | ICD-10-CM | POA: Diagnosis not present

## 2016-02-18 DIAGNOSIS — G4733 Obstructive sleep apnea (adult) (pediatric): Secondary | ICD-10-CM | POA: Diagnosis not present

## 2016-02-18 HISTORY — PX: ESOPHAGOGASTRODUODENOSCOPY: SHX5428

## 2016-02-18 LAB — CBC
HEMATOCRIT: 20.7 % — AB (ref 39.0–52.0)
HEMATOCRIT: 27.2 % — AB (ref 39.0–52.0)
HEMOGLOBIN: 6.8 g/dL — AB (ref 13.0–17.0)
HEMOGLOBIN: 8.8 g/dL — AB (ref 13.0–17.0)
MCH: 30 pg (ref 26.0–34.0)
MCH: 28.4 pg (ref 26.0–34.0)
MCHC: 32.9 g/dL (ref 30.0–36.0)
MCHC: 32.4 g/dL (ref 30.0–36.0)
MCV: 91.2 fL (ref 78.0–100.0)
MCV: 87.7 fL (ref 78.0–100.0)
Platelets: 85 10*3/uL — ABNORMAL LOW (ref 150–400)
Platelets: 91 10*3/uL — ABNORMAL LOW (ref 150–400)
RBC: 2.27 MIL/uL — ABNORMAL LOW (ref 4.22–5.81)
RBC: 3.1 MIL/uL — ABNORMAL LOW (ref 4.22–5.81)
RDW: 16.8 % — ABNORMAL HIGH (ref 11.5–15.5)
RDW: 17.7 % — ABNORMAL HIGH (ref 11.5–15.5)
WBC: 2.8 10*3/uL — ABNORMAL LOW (ref 4.0–10.5)
WBC: 3.1 10*3/uL — AB (ref 4.0–10.5)

## 2016-02-18 LAB — PREPARE RBC (CROSSMATCH)

## 2016-02-18 SURGERY — EGD (ESOPHAGOGASTRODUODENOSCOPY)
Anesthesia: Moderate Sedation

## 2016-02-18 MED ORDER — BUTAMBEN-TETRACAINE-BENZOCAINE 2-2-14 % EX AERO
INHALATION_SPRAY | CUTANEOUS | Status: DC | PRN
  Administered 2016-02-18: 2 via TOPICAL

## 2016-02-18 MED ORDER — DIPHENHYDRAMINE HCL 50 MG/ML IJ SOLN
INTRAMUSCULAR | Status: AC
  Filled 2016-02-18: qty 1

## 2016-02-18 MED ORDER — SUCRALFATE 1 GM/10ML PO SUSP
1.0000 g | Freq: Three times a day (TID) | ORAL | Status: DC
  Administered 2016-02-18 – 2016-02-19 (×3): 1 g via ORAL
  Filled 2016-02-18 (×3): qty 10

## 2016-02-18 MED ORDER — DIGOXIN 125 MCG PO TABS
0.1250 mg | ORAL_TABLET | Freq: Every day | ORAL | Status: DC
  Administered 2016-02-18 – 2016-02-19 (×2): 0.125 mg via ORAL
  Filled 2016-02-18 (×3): qty 1

## 2016-02-18 MED ORDER — PANTOPRAZOLE SODIUM 40 MG IV SOLR
40.0000 mg | Freq: Two times a day (BID) | INTRAVENOUS | Status: DC
  Administered 2016-02-18 – 2016-02-19 (×2): 40 mg via INTRAVENOUS
  Filled 2016-02-18 (×2): qty 40

## 2016-02-18 MED ORDER — CARVEDILOL 6.25 MG PO TABS
6.2500 mg | ORAL_TABLET | Freq: Two times a day (BID) | ORAL | Status: DC
  Administered 2016-02-18 – 2016-02-19 (×2): 6.25 mg via ORAL
  Filled 2016-02-18 (×3): qty 1

## 2016-02-18 MED ORDER — SODIUM CHLORIDE 0.9% FLUSH: 3.0000 mL | INTRAVENOUS | Status: DC | PRN

## 2016-02-18 MED ORDER — SODIUM CHLORIDE 0.9 % IV SOLN: INTRAVENOUS | Status: DC

## 2016-02-18 MED ORDER — FENTANYL CITRATE (PF) 100 MCG/2ML IJ SOLN
INTRAMUSCULAR | Status: AC
  Filled 2016-02-18: qty 2

## 2016-02-18 MED ORDER — LEVOTHYROXINE SODIUM 75 MCG PO TABS
75.0000 ug | ORAL_TABLET | Freq: Every day | ORAL | Status: DC
Start: 2016-02-18 — End: 2016-02-19
  Administered 2016-02-18 – 2016-02-19 (×2): 75 ug via ORAL
  Filled 2016-02-18 (×3): qty 1

## 2016-02-18 MED ORDER — MIDAZOLAM HCL 5 MG/ML IJ SOLN
INTRAMUSCULAR | Status: AC
  Filled 2016-02-18: qty 2

## 2016-02-18 MED ORDER — NITROGLYCERIN 0.4 MG SL SUBL: 0.4000 mg | SUBLINGUAL_TABLET | SUBLINGUAL | Status: DC | PRN

## 2016-02-18 MED ORDER — SODIUM CHLORIDE 0.9 % IV SOLN
Freq: Once | INTRAVENOUS | Status: AC
  Administered 2016-02-18: 10 mL/h via INTRAVENOUS

## 2016-02-18 MED ORDER — PANTOPRAZOLE SODIUM 40 MG IV SOLR: 40.0000 mg | INTRAVENOUS | Status: DC

## 2016-02-18 MED ORDER — ATORVASTATIN CALCIUM 40 MG PO TABS
40.0000 mg | ORAL_TABLET | Freq: Every day | ORAL | Status: DC
  Administered 2016-02-18 – 2016-02-19 (×2): 40 mg via ORAL
  Filled 2016-02-18 (×2): qty 1

## 2016-02-18 MED ORDER — FUROSEMIDE 10 MG/ML IJ SOLN: 20.0000 mg | Freq: Once | INTRAMUSCULAR | Status: DC

## 2016-02-18 MED ORDER — SODIUM CHLORIDE 0.9% FLUSH
3.0000 mL | Freq: Two times a day (BID) | INTRAVENOUS | Status: DC
  Administered 2016-02-18 – 2016-02-19 (×2): 3 mL via INTRAVENOUS

## 2016-02-18 MED ORDER — ALLOPURINOL 300 MG PO TABS
300.0000 mg | ORAL_TABLET | Freq: Every day | ORAL | Status: DC
  Administered 2016-02-18 – 2016-02-19 (×2): 300 mg via ORAL
  Filled 2016-02-18 (×2): qty 1

## 2016-02-18 MED ORDER — INSULIN ASPART 100 UNIT/ML ~~LOC~~ SOLN: 0.0000 [IU] | Freq: Three times a day (TID) | SUBCUTANEOUS | Status: DC

## 2016-02-18 MED ORDER — MIDAZOLAM HCL 10 MG/2ML IJ SOLN
INTRAMUSCULAR | Status: DC | PRN
  Administered 2016-02-18 (×3): 1 mg via INTRAVENOUS

## 2016-02-18 NOTE — Op Note (Signed)
[facility] Patient Name: Walter French Procedure Date : 02/18/2016 MRN: [id number] Attending MD: Ronald Lobo , MD Date of Birth: [birth year] CSN: [id number] Age: 71 Admit Type: Inpatient Procedure:                Upper GI endoscopy Indications:              Acute post hemorrhagic anemia (4 gm drop in hgb                            over the past week), Melena, h/o distal                            esophageal/gastric cardia cancer completed                            chemoradiation therapy 10/2015 Providers:                Ronald Lobo, MD, Carolynn Comment, RN, William Dalton, Technician Referring MD:              Medicines:                Midazolam 3 mg IV Complications:            No immediate complications. Estimated Blood Loss:     Estimated blood loss: none. Procedure:                Pre-Anesthesia Assessment:                           - Prior to the procedure, a History and Physical                            was performed, and patient medications and                            allergies were reviewed. [name] tolerance of                            previous anesthesia was also reviewed. The risks                            and benefits of the procedure and the sedation                            options and risks were discussed with the patient.                            All questions were answered, and informed consent                            was obtained. Prior Anticoagulants: The patient has  taken Eliquis (apixaban), last dose was 1 day prior                            to procedure. ASA Grade Assessment: III - A patient                            with severe systemic disease. After reviewing the                            risks and benefits, the patient was deemed in                            satisfactory condition to undergo the procedure.                           After obtaining informed  consent, the pediatric                            endoscope was passed under direct vision.                            Throughout the procedure, the patient's blood                            pressure, pulse, and oxygen saturations were                            monitored continuously. The [id number]                            scope was introduced through the mouth under direct                            vision, and advanced to the second part of                            duodenum. The larynx was not well seen. The upper                            GI endoscopy was accomplished without difficulty.                            The patient tolerated the procedure well.                           DUE TO AN EQUIPMENT MALFUNCTION, WE WERE UNABLE TO                            OBTAIN PHOTOGRAPHS DURING THIS PROCEDURE. Scope In: Scope Out: Findings:      Moderately severe distal esophagitis, consistent with radiation       esophagitis, but with no bleeding was found.      There is no endoscopic evidence of residual mass in the distal esophagus.  One non-bleeding superficial gastric ulcer with a clean ulcer base       (Forrest Class III) was found in the cardia, corresponding to the site       of the previous tumor (appearance of ulcer grossly similar to 07/2015       egd). The lesion was 17 mm in largest dimension.      However, there is no endoscopic evidence of mass in the cardia.      Patchy moderately erythematous mucosa with bleeding was found in the       entire examined stomach. There were small spots and clumps of blood       which were easily irrigated away,exposing non-eroded erythematous mucosa.      Patchy mildly erythematous mucosa without active bleeding was found in       the second portion of the duodenum. Impression:               - Moderately severe radiation esophagitis.                           - Non-bleeding gastric ulcer with a clean ulcer                             base (Forrest Class III), corresponding to previous                            tumor site. No evidence that this was the source of                            the bleeding.                           - Erythematous mucosa in the stomach with focal                            mucosal hemorrhage.                           - Erythematous duodenopathy.                           - No specimens collected.                           - It appears that the source of the patient's                            bleeding is his hemorrhagic gastritis, the bleeding                            from which would presumably have been magnified had                            he still been on his anticoagulation. It does not                            appear that the ulceration at his previous  tumor                            site is the source of his bleeding. Moderate Sedation:      Moderate (conscious) sedation was administered by the endoscopy nurse       and supervised by the endoscopist. The following parameters were       monitored: oxygen saturation, heart rate, blood pressure, and response       to care. Total physician intraservice time was 10 minutes. Sedation was       administered very cautiously because of EF of 30%, h/o sleep apnea. Recommendation:           - Use sucralfate suspension 1 gram PO QID                            indefinitely.                           - Continue PPI therapy                           - Advance diet                           - Consider resumption of anticoagulation in a few                            days, recognizing that there will be a moderately                            high risk of rebleeding, depending on the efficacy                            of the Protonix and sucralfate in healing has                            gastritis.                           - Resume regular diet.                           - Continue present medications. Procedure Code(s):         --- Professional ---                           [phone], Esophagogastroduodenoscopy, flexible,                            transoral; diagnostic, including collection of                            specimen(s) by brushing or washing, when performed                            (separate procedure)  [id number], Moderate sedation services provided by the                            same physician or other qualified health care                            professional performing the diagnostic or                            therapeutic service that the sedation supports,                            requiring the presence of an independent trained                            observer to assist in the monitoring of the                            patient's level of consciousness and physiological                            status; initial 15 minutes of intraservice time,                            patient age 74 years or older Diagnosis Code(s):        --- Professional ---                           K20.8, Other esophagitis                           K31.89, Other diseases of stomach and duodenum                           D62, Acute posthemorrhagic anemia                           K92.1, Melena (includes Hematochezia) CPT copyright 2016 American [employer] Association. All rights reserved. The codes documented in this report are preliminary and upon coder review may  be revised to meet current compliance requirements. Ronald Lobo, MD  Ronald Lobo, MD 02/18/2016 4:50:41 PM This report has been signed electronically. Number of Addenda: 0

## 2016-02-18 NOTE — ED Notes (Signed)
Attempted Report x1.   

## 2016-02-18 NOTE — Progress Notes (Signed)
Endoscopy well tolerated.  Please see dictated procedure report for details.  The problem seems to be hemorrhagic gastritis, perhaps as a consequence of his previous radiation therapy, presumably with exacerbation of bleeding while anticoagulated.   Interestingly, his esophageal/gastric cardia cancer seems to have pretty much resolved, although there is some nonbleeding residual mucosal ulceration at the site of the previous tumor. I did not take biopsies because of the recent bleeding issue.  My hope is that intensive antipeptic therapy with high-dose PPI and sucralfate will heal the gastritis; therefore, I think that if ongoing anticoagulation is felt to be strongly indicated in this patient, a repeat trial of some form of anticoagulation would be reasonable, watching for evidence of rebleeding after doing so.  I would probably leave the patient on PPI plus sucralfate indefinitely.  I will sign off. However, do not hesitate to call us back if further input from [place] would be helpful.  Walter French, M.D. Pager [phone] If no answer or after 5 PM call [phone]

## 2016-02-18 NOTE — Progress Notes (Signed)
Subjective: Doing well this morning. He denies chest pain. He reports his dyspnea is at baseline. He denies fatigue, lethargy, dizziness or lightheadedness. No hemoptysis.   Objective: Vital signs in last 24 hours: Filed Vitals:   02/18/16 0715 02/18/16 0730 02/18/16 0745 02/18/16 0823  BP: 104/48 120/69 111/65 131/67  Pulse: 79 81 77 87  Temp:   97.9 F (36.6 C) 97.5 F (36.4 C)  TempSrc:   Oral Oral  Resp: 22 17 24 18   SpO2: 96% 97% 98% 98%   Weight change:   Intake/Output Summary (Last 24 hours) at 02/18/16 1020 Last data filed at 02/18/16 0518  Gross per 24 hour  Intake     10 ml  Output    300 ml  Net   -290 ml   General Apperance: NAD HEENT: Normocephalic, atraumatic, anicteric sclera Neck: Supple, trachea midline Lungs: Clear to auscultation bilaterally. No wheezes, rhonchi or rales. Breathing comfortably Heart: Regular rate and rhythm, no murmur/rub/gallop Abdomen: Soft, nontender, nondistended, no rebound/guarding Extremities: Warm and well perfused, 1+ BLE edema Skin: No rashes or lesions Neurologic: Alert and interactive. No gross deficits.  Lab Results: Basic Metabolic Panel:  Recent Labs Lab 02/17/16 1742  NA 141  K 4.0  CL 110  CO2 23  GLUCOSE 113*  BUN 15  CREATININE 1.09  CALCIUM 9.1   Liver Function Tests:  Recent Labs Lab 02/17/16 1742  AST 22  ALT 8*  ALKPHOS 164*  BILITOT 0.5  PROT 5.7*  ALBUMIN 3.0*   CBC:  Recent Labs Lab 02/17/16 1742 02/18/16 0214  WBC 4.6 2.8*  HGB 8.5* 6.8*  HCT 26.8* 20.7*  MCV 91.8 91.2  PLT 99* 85*   Coagulation:  Recent Labs Lab 02/17/16 1742  LABPROT 16.7*  INR 1.34    Studies/Results: No results found.   Medications: I have reviewed the patient's current medications. Scheduled Meds: . allopurinol  300 mg Oral Daily  . atorvastatin  40 mg Oral Daily  . carvedilol  6.25 mg Oral BID WC  . digoxin  0.125 mg Oral Daily  . furosemide  20 mg Intravenous Once  . levothyroxine  75  mcg Oral QAC breakfast  . pantoprazole (PROTONIX) IV  40 mg Intravenous Q24H  . 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 Fib on Eliquis, CAD s/p CABG, PAD, DM type II, OSA, Gout, HTN, HLD, obesity presenting today with melena.  Upper GI Bleed: Last Hgb was 10.6 on 3/1. Remains hemodynamically stable. Hgb on admission 8.5. Received 2u pRBC last night.  -Transfuse Hgb <8 given preexisting heart disease and CHF -NPO at midnight -Consult GI today -IV Protonix 40 mg BID -CBC q12hr -Holding Eliquis, last dose 3/8 am -Hold Lasix  Hx of Esophageal Adenocarcinoma s/p Radiation and Chemotherapy: TXN2M0.  -Continue to monitor for now  Thrombocytopenia: Platelets 99 on admission. Previously 119 on 02/10/2016. Patient with upper GI bleed currently. Will continue to monitor.  -CBC q12hr  DM Type II: diet controlled -Continue to monitor  Systolic and Diastolic CHF with ICD placement: Last ECHO 07/2015 with EF 30-35%, moderate LVH and grade 2 diastolic dysfunction.  -Hold home lasix 40 mg daily  -Continue coreg 6.25 mg bid.   CAD s/p CABG: Stable. No chest pain or change in baseline shortness of breath. s/p CABG x 3 (VG->PDA, VG->PL, LIMA->LAD);  Underwent left heart catheterization on 07/2015 which showed severe 3 vessel obstructive disease with patent grafts. Did have 50% stenosis of LIMA. He is currently on lasix 40 mg daily, Lipitor 40 mg daily, coreg 6.25 mg bid.  -Holding lasix per above -Continue Lipitor and coreg  Atrial Fibrillation s/p DCCV in 10/2015: CHADS-VASc score is 5.  -Holding Apixaban for GI bleed -Continue Coreg and Digoxin  Hypothyroidism: TSH 0.588 10/2015. -Continue home dose levothyroxine   Gout: Continue home allopurinol 300 mg daily. HLD: Continue  home atorvastatin 40 mg daily.  VTE ppx: SCDs Code: Full  Dispo: Disposition is deferred at this time, awaiting improvement of current medical problems.  Anticipated discharge in approximately 1-2 day(s).   The patient does have a current PCP Karlene Einstein, MD) and does not need an [facility] hospital follow-up appointment after discharge.  The patient does not have transportation limitations that hinder transportation to clinic appointments.  .Services Needed at time of discharge: Y = Yes, Blank = No PT:   OT:   RN:   Equipment:   Other:     LOS: 0 days   Milagros Loll, MD 02/18/2016, 10:20 AM

## 2016-02-18 NOTE — Progress Notes (Signed)
RN called to receive report.

## 2016-02-18 NOTE — Progress Notes (Signed)
Subjective: [name] did well overnight with no acute complaints. He denies chest pain or SOB this morning.  Objective: Vital signs in last 24 hours: Filed Vitals:   02/18/16 0715 02/18/16 0730 02/18/16 0745 02/18/16 0823  BP: 104/48 120/69 111/65 131/67  Pulse: 79 81 77 87  Temp:   97.9 F (36.6 C) 97.5 F (36.4 C)  TempSrc:   Oral Oral  Resp: 22 17 24 18   SpO2: 96% 97% 98% 98%   Weight change:   Intake/Output Summary (Last 24 hours) at 02/18/16 1035 Last data filed at 02/18/16 0518  Gross per 24 hour  Intake     10 ml  Output    300 ml  Net   -290 ml   BP 131/67 mmHg  Pulse 87  Temp(Src) 97.5 F (36.4 C) (Oral)  Resp 18  SpO2 98% General appearance: alert, cooperative, appears stated age and no distress Head: Normocephalic, without obvious abnormality, atraumatic Eyes: Clear conjunctiva with moist mucous membranes Lungs: Clear to auscultation bilaterally with normal respiratory effort. No wheezes or crackles. Heart: regular rate and rhythm, S1, S2 normal, no murmur, click, rub or gallop Abdomen: Soft, nontender, nondistended, no tenderness to percussion Extremities: 1+ pitting edema to knees bilaterally Lab Results: Basic Metabolic Panel:  Recent Labs  02/17/16 1742  NA 141  K 4.0  CL 110  CO2 23  GLUCOSE 113*  BUN 15  CREATININE 1.09  CALCIUM 9.1   Liver Function Tests:  Recent Labs  02/17/16 1742  AST 22  ALT 8*  ALKPHOS 164*  BILITOT 0.5  PROT 5.7*  ALBUMIN 3.0*    CBC:  Recent Labs  02/17/16 1742 02/18/16 0214  WBC 4.6 2.8*  HGB 8.5* 6.8*  HCT 26.8* 20.7*  MCV 91.8 91.2  PLT 99* 85*   Coagulation:  Recent Labs  02/17/16 1742  LABPROT 16.7*  INR 1.34    Medications: I have reviewed the patient's current medications. Scheduled Meds: . allopurinol  300 mg Oral Daily  . atorvastatin  40 mg Oral Daily  . carvedilol  6.25 mg Oral BID WC  . digoxin  0.125 mg Oral Daily  . furosemide  20 mg Intravenous Once  . levothyroxine   75 mcg Oral QAC breakfast  . pantoprazole (PROTONIX) IV  40 mg Intravenous Q12H  . sodium chloride flush  3 mL Intravenous Q12H   Continuous Infusions:  PRN Meds:.nitroGLYCERIN, sodium chloride flush Assessment/Plan: Principal Problem:   Upper GI bleed Active Problems:   Coronary artery disease-status post CABG   Automatic implantable cardioverter-defibrillator in situ   Systolic and diastolic CHF, chronic (HCC)   PAF-    Thrombocytopenia (HCC)   Type 2 diabetes mellitus with renal manifestations (HCC)   Hypothyroidism   Gout   Chronic anticoagulation - Eliquis, CHADS2VASC=5   Cardiomyopathy, ischemic-30-35%   Adenocarcinoma of esophagus (HCC)  Upper GI Bleed He has a 2 month history of melena. Hgb 7.0 at PCP yesterday, 8.5 overnight, and 6.8 on recheck overnight. Transfused one pRBC overnight and will receive one more this AM. Patient on Eliquis for PAF. Hx of esophageal adenocarcinoma treated with radiation and chemotherapy 07/2015-09/2015. Currently in remission. Source of bleeding is most likely esophogeal adenocarcinoma. His esophogeal mucosa could also be irritated secondary to radiation therapy. Currently hemodynamically stable and asymptomatic. Will plan for GI consult this AM. He has previously been [facility] at [facility] by Dr. Penelope Coop. Type and screen  -F/u CBC after second transfusion of pRBC -consult GI -continue telemetry -Type and  screen -NPO -Protonix 40mg  IV -CBC q12hr -holding eliquis, last dose 3/8 am -hold lasix  Hx of esophageal adenocarcinoma TXN2M0. Underwent radiation and chemotherapy with taxol/carboplatin with last cycle on 09/2015.  Thrombocytopenia. Platelets 85 on admission down from 151 1 month ago. Will continue to monitor -CBC q12hr  Type II DM Was recently admitted on 10/2015 with hypoglycemia in the 20s despite recently being taken off insulin and transitioned to glipizide. All diabetic medications were stopped and hypoglycemia was attributed to  malignancy associated weight loss. HgbA1c was 5.6 at that time.  -Monitor blood glucose  Hx of amiodarone pulmonary toxicity Admitted on 10/2015 with acute hypoxic respiratory failure. Followed by Dr. Melvyn Novas. PFTs done at 3/1 office visit with VC 2.81 57% without sign of airflow obstruction with DLCO 29/36% corrects to 63% for alv volume.  Systolic and Diastolic CHF with ICD placement Last echo 07/2015 with 30-35% EF, moderate LVH and grade 2 diastolic dysfunction. On Lasix 40mg  daily and coreg 6.25 mg bid -lasix held  -continue coreg 6.25 mg bid  CAD s/p CABG Stable.  -continue lipitor 40mg  daily -continue coreg 6.25 mg bid  PAD ABIs in 12/2015 showed patent stent with borderline ABI on the right side at 0.93 and normal on the left  Afib s/p DCCV in 10/2015 NSR yesterday on admission. Currently takes apixiban, digoxin, and coreg as an outpatient. Amiodarone d/c'ed previously due to pulmonary toxicity. Last apixiban was 3/8 AM and is currently held. CHADS-Vasc score is 5. Cardiology recommended Tikosyn if tachycardia recurs. -Apixiban held -continue coreg 6.25 mg bid and digoxin 0.125 mg daily  Hypothyroidism: TSH 0.588 10/2015. On levothyroxine. -continue home levothyroxine 75 mcg daily  Gout: Takes allopurinol -Continue allopurinol 300 mg daily  HLD:  -continue atorvastatin 40 mg daily  DVT PPx: SCDs  Code: Full  Dispo: Disposition is deferred. Awaiting workup and improvement in medical condition. Anticipated discharge 2-3 days.     This is a Careers information officer Note.  The care of the patient was discussed with Dr. Randell Patient and the assessment and plan formulated with their assistance.  Please see their attached note for official documentation of the daily encounter.   LOS: 0 days   Walter French, Med Student 02/18/2016, 10:35 AM

## 2016-02-18 NOTE — Care Management Note (Signed)
Case Management Note  Patient Details  Name: Walter French MRN: [id number] Date of Birth: March 06, 1946  Subjective/Objective:                    Action/Plan:  Initial UR completed  Expected Discharge Date:                  Expected Discharge Plan:  Home/Self Care  In-House Referral:     Discharge planning Services     Post Acute Care Choice:    Choice offered to:     DME Arranged:    DME Agency:     HH Arranged:    Superior Agency:     Status of Service:  In process, will continue to follow  Medicare Important Message Given:    Date Medicare IM Given:    Medicare IM give by:    Date Additional Medicare IM Given:    Additional Medicare Important Message give by:     If discussed at [place] of Stay Meetings, dates discussed:    Additional Comments:  Marilu Favre, RN 02/18/2016, 10:35 AM

## 2016-02-18 NOTE — Consult Note (Signed)
Referring Provider:   Medicine teaching service (Dr. Oval Linsey, attending) Primary Care Physician:  Karlene Einstein, MD Primary Gastroenterologist:  Dr. Penelope Coop  Reason for Consultation:  Melena and anemia  HPI: Walter French is a 70 y.o. male with a roughly 3 g drop in hemoglobin over the past 10 days, associated with multiple melenic stools, most recently yesterday evening, but without any upper tract symptoms or any symptoms of hemodynamic instability or anemia such as presyncope or weakness or shortness of breath.   Of note, he is on Eloquis for atrial fibrillation (last dose approximately 28 hours ago), and he is several months status post completion of chemoradiation therapy (Dr. Julieanne Manson) for esophageal cancer. He is reportedly in remission, and does not have any symptoms of dysphagia or recent weight loss (he states he lost about 80 pounds around the time of his initial diagnosis and treatment). At the time of his endoscopy back in August of last year, the patient did not have any evident peptic disease, just malignant ulceration in the cardia of his stomach from the cancer extending into that area.  The patient states he had a colonoscopy several years ago performed in [place]; apparently no significant findings uncovered at that time.  [name] hemoglobin was 10.6 just 8 days ago; on presentation to the emergency room, it was 8.5 and, today it is 6.8. He received one unit of blood earlier today and is now receiving a second unit..     Past Medical History  Diagnosis Date  . Hyperlipidemia   . Hypertension   . Coronary heart disease     a. s/p CABG x 3 (VG->PDA, VG->PL, LIMA->LAD);  b. 2013 Abnl Myoview;  c. 2013 Cath: 3/3 patent grafts, occluded LCX which correlated w/ ischemia on MV->not amenable to PCI->Med Rx.  . Obesity   . CHF (congestive heart failure) ([place])   . VT (ventricular tachycardia) ([place])   . SVT (supraventricular tachycardia) ([place])   . PAD  (peripheral artery disease) ([place])     Angiography in February of 2014: Moderate left iliac artery stenosis, mild to moderate diffuse right SFA disease. Severe proximal right popliteal artery stenosis with three-vessel runoff below the knee. Status post self-expanding stent placement to the proximal popliteal artery  . Implantable cardioverter-defibrillator Mdt   . Asthma     "small touch" (07/29/2013)  . History of pneumonia     "3-4 times; last time ~ 2003" (07/29/2013)  . OSA on CPAP   . GERD (gastroesophageal reflux disease)   . H/O hiatal hernia   . Gout   . Anxiety   . Allergy   . Stomach cancer ([place]) 07/23/2015    invasive adenocarcinoma ,esophagus ,distal   . Type II diabetes mellitus ([place])     on insulin  . Sleep apnea   . Hyperlipidemia   . PAD (peripheral artery disease) ([place])   . CHF (congestive heart failure) ([place])   . A-fib ([place])     a. 11/09/2015: successful DCCV  . CKD (chronic kidney disease), stage III   . S/P radiation therapy 08/12/15-09/24/15    Ge  junction 54 Gy    Past Surgical History  Procedure Laterality Date  . Tonsillectomy    . Cardiac catheterization    . Wrist fracture surgery Right   . Cataract extraction w/ intraocular lens implant Left   . Cardiac defibrillator placement  1997; ~ 2003  . Coronary artery bypass graft  1997; 2007    "X3" (07/29/2013)  .  Nerve, tendon and artery repair Left 09/20/2013    Procedure: IRRIGATION AND DEBRIDEMENT,Exploration and Repair of Ulnar/Digital  Artery Nerve of Left Index finger;  Surgeon: Roseanne Kaufman, MD;  Location: [facility];  Service: Orthopedics;  Laterality: Left;  . Abdominal aortagram N/A 01/30/2013    Procedure: ABDOMINAL Maxcine Ham;  Surgeon: Wellington Hampshire, MD;  Location: [facility] CATH LAB;  Service: Cardiovascular;  Laterality: N/A;  . Cardiac catheterization N/A 07/22/2015    Procedure: Left Heart Cath and Coronary Angiography;  Surgeon: Peter M Martinique, MD;  Location: [place] CV LAB;  Service:  Cardiovascular;  Laterality: N/A;  . Esophagogastroduodenoscopy N/A 07/23/2015    Procedure: ESOPHAGOGASTRODUODENOSCOPY (EGD);  Surgeon: Wonda Horner, MD;  Location[facility] ENDOSCOPY;  Service: Endoscopy;  Laterality: N/A;  . Tee without cardioversion N/A 10/06/2015    Procedure: TRANSESOPHAGEAL ECHOCARDIOGRAM (TEE);  Surgeon: Pixie Casino, MD;  Location: [facility] ENDOSCOPY;  Service: Cardiovascular;  Laterality: N/A;  . Cardioversion N/A 11/09/2015    Procedure: CARDIOVERSION;  Surgeon: Pixie Casino, MD;  Location: [facility] ENDOSCOPY;  Service: Cardiovascular;  Laterality: N/A;    Prior to Admission medications   Medication Sig Start Date End Date Taking? Authorizing Provider  allopurinol (ZYLOPRIM) 300 MG tablet Take 300 mg by mouth daily.     Yes Historical Provider, MD  apixaban (ELIQUIS) 5 MG TABS tablet Take 1 tablet (5 mg total) by mouth 2 (two) times daily. 11/04/15  Yes Larey Dresser, MD  atorvastatin (LIPITOR) 40 MG tablet Take 1 tablet (40 mg total) by mouth daily. 03/11/15  Yes Deboraha Sprang, MD  carvedilol (COREG) 6.25 MG tablet take 1 tablet by mouth twice a day with food 12/15/15  Yes Peter M Martinique, MD  digoxin (LANOXIN) 0.125 MG tablet Take 1 tablet (0.125 mg total) by mouth daily. 10/20/15  Yes Peter M Martinique, MD  furosemide (LASIX) 40 MG tablet Take 1 tablet (40 mg total) by mouth daily. 11/16/15  Yes Janece Canterbury, MD  levothyroxine (SYNTHROID, LEVOTHROID) 75 MCG tablet Take 75 mcg by mouth daily before breakfast.  12/06/14  Yes Historical Provider, MD  potassium chloride SA (K-DUR,KLOR-CON) 20 MEQ tablet Take 1 tablet (20 mEq total) by mouth daily. 10/10/15  Yes Brett Canales, PA-C  ranitidine (ZANTAC) 300 MG tablet Take 300 mg by mouth daily.   Yes Historical Provider, MD  LORazepam (ATIVAN) 0.5 MG tablet Take 1 tablet (0.5 mg total) by mouth every 8 (eight) hours. Patient taking differently: Take 0.5 mg by mouth every 8 (eight) hours as needed.  11/16/15   Janece Canterbury, MD   nitroGLYCERIN (NITROSTAT) 0.4 MG SL tablet Place 1 tablet (0.4 mg total) under the tongue every 5 (five) minutes x 3 doses as needed for chest pain. 10/03/14   Peter M Martinique, MD    Current Facility-Administered Medications  Medication Dose Route Frequency Provider Last Rate Last Dose  . allopurinol (ZYLOPRIM) tablet 300 mg  300 mg Oral Daily Francesca Oman, DO   300 mg at 02/18/16 1231  . atorvastatin (LIPITOR) tablet 40 mg  40 mg Oral Daily Francesca Oman, DO   40 mg at 02/18/16 1231  . carvedilol (COREG) tablet 6.25 mg  6.25 mg Oral BID WC Francesca Oman, DO   6.25 mg at 02/18/16 [id number]  . digoxin (LANOXIN) tablet 0.125 mg  0.125 mg Oral Daily Francesca Oman, DO   0.125 mg at 02/18/16 1231  . furosemide (LASIX) injection 20 mg  20 mg Intravenous Once Cristie Hem  Ronnie Derby, DO   Stopped at 02/18/16 1232  . levothyroxine (SYNTHROID, LEVOTHROID) tablet 75 mcg  75 mcg Oral QAC breakfast Francesca Oman, DO   75 mcg at 02/18/16 [id number]  . nitroGLYCERIN (NITROSTAT) SL tablet 0.4 mg  0.4 mg Sublingual Q5 Min x 3 PRN Francesca Oman, DO      . pantoprazole (PROTONIX) injection 40 mg  40 mg Intravenous Q12H Milagros Loll, MD      . sodium chloride flush (NS) 0.9 % injection 3 mL  3 mL Intravenous Q12H Francesca Oman, DO   0 mL at 02/18/16 1232  . sodium chloride flush (NS) 0.9 % injection 3 mL  3 mL Intravenous PRN Francesca Oman, DO        Allergies as of 02/17/2016 - Review Complete 02/17/2016  Allergen Reaction Noted  . Adhesive [tape] Other (See Comments) 09/20/2013  . Levaquin [levofloxacin in d5w] Nausea And Vomiting 09/14/2015  . Morphine and related Nausea And Vomiting 06/09/2011    Family History  Problem Relation Age of Onset  . Heart disease Brother   . Cancer Brother   . Breast cancer Mother     Social History   Social History  . Marital Status: Single    Spouse Name: N/A  . Number of Children: 0  . Years of Education: N/A   Occupational History  . retired Surveyor, mining   .      Social History Main Topics  . Smoking status: Former Smoker -- 2.00 packs/day for 30 years    Types: Cigarettes    Quit date: 12/13/1995  . Smokeless tobacco: Never Used  . Alcohol Use: No     Comment: 07/29/2013 "not touched any alcohol since 1997"  . Drug Use: No  . Sexual Activity: Not Currently   Other Topics Concern  . Not on file   Social History Narrative   Single, never married   Retired Administrator   Close friend, [name] and her children are his main support system   Independent in ADLs.    Review of Systems: The patient states he feels well overall. No weakness, no dizziness, no shortness of breath, no chest pain, no dysphagia or reflux symptoms, no dyspepsia.  Physical Exam: Vital signs in last 24 hours: Temp:  [97.5 F (36.4 C)-98.4 F (36.9 C)] 98.4 F (36.9 C) (03/09 1300) Pulse Rate:  [65-103] 73 (03/09 1300) Resp:  [16-32] 18 (03/09 1300) BP: (98-142)/(46-84) 122/61 mmHg (03/09 1300) SpO2:  [66 %-100 %] 100 % (03/09 1300)   General:   Alert,  Well-developed, well-nourished, pleasant and cooperative in NAD Head:  Normocephalic and atraumatic. Eyes:  Sclera clear, no icterus.   Conjunctiva slightly pale. Mouth:   No ulcerations or lesions.  Oropharynx pink & moist. Neck:   No masses or thyromegaly. Lungs:  Few scattered inspiratory crackles. Heart:   Regular rate and rhythm at present; no murmurs, clicks, rubs,  or gallops. Abdomen:  Soft, nontender, slightly adipose, and nondistended. No masses, hepatosplenomegaly or ventral hernias noted. Msk:   Symmetrical without gross deformities. Pulses:  Normal radial pulse is noted. Extremities:   Without clubbing, cyanosis, or edema. Neurologic:  Alert and coherent;  grossly normal neurologically. Skin:  Intact without significant lesions or rashes. Cervical Nodes:  No significant cervical adenopathy. Psych:   Alert and cooperative. Normal mood and affect.  Intake/Output from previous day: 03/08 0701 -  03/09 0700 In: 10 [I.V.:10] Out: 300 [Urine:300] Intake/Output this shift:  Lab Results:  Recent Labs  02/17/16 1742 02/18/16 0214  WBC 4.6 2.8*  HGB 8.5* 6.8*  HCT 26.8* 20.7*  PLT 99* 85*   BMET  Recent Labs  02/17/16 1742  NA 141  K 4.0  CL 110  CO2 23  GLUCOSE 113*  BUN 15  CREATININE 1.09  CALCIUM 9.1   LFT  Recent Labs  02/17/16 1742  PROT 5.7*  ALBUMIN 3.0*  AST 22  ALT 8*  ALKPHOS 164*  BILITOT 0.5   PT/INR  Recent Labs  02/17/16 1742  LABPROT 16.7*  INR 1.34    Studies/Results: No results found.  Impression:  1. Melenic stool 2. Posthemorrhagic anemia, acute 3. History of chemoradiation for esophageal cancer, currently in remission 4. Up-to-date on colon cancer screening, per patient   It appears that this patient had an upper tract bleed based on character of stool and risk factors; the normal BUN on presentation to the emergency room, and the absence of further BMs since last night would suggest that he has probably stopped bleeding as of this time, despite an overnight drop in hemoglobin which is presumably the result of equilibration.  Plan: Endoscopic evaluation this afternoon. The purpose and risks of the procedure were reviewed with the patient who is agreeable to proceed. Further management will depend on the endoscopic findings.   LOS: 0 days   Cairo Agostinelli V  02/18/2016, 1:08 PM   Pager [phone] If no answer or after 5 PM call [phone]

## 2016-02-19 DIAGNOSIS — K208 Other esophagitis without bleeding: Secondary | ICD-10-CM | POA: Insufficient documentation

## 2016-02-19 DIAGNOSIS — K2960 Other gastritis without bleeding: Secondary | ICD-10-CM | POA: Insufficient documentation

## 2016-02-19 LAB — CBC
HEMATOCRIT: 29.5 % — AB (ref 39.0–52.0)
Hemoglobin: 9.2 g/dL — ABNORMAL LOW (ref 13.0–17.0)
MCH: 27.9 pg (ref 26.0–34.0)
MCHC: 31.2 g/dL (ref 30.0–36.0)
MCV: 89.4 fL (ref 78.0–100.0)
Platelets: 99 10*3/uL — ABNORMAL LOW (ref 150–400)
RBC: 3.3 MIL/uL — ABNORMAL LOW (ref 4.22–5.81)
RDW: 18.3 % — AB (ref 11.5–15.5)
WBC: 3.3 10*3/uL — AB (ref 4.0–10.5)

## 2016-02-19 LAB — TYPE AND SCREEN
ABO/RH(D): O POS
ANTIBODY SCREEN: NEGATIVE
UNIT DIVISION: 0
Unit division: 0

## 2016-02-19 LAB — BASIC METABOLIC PANEL
Anion gap: 7 (ref 5–15)
BUN: 11 mg/dL (ref 6–20)
CHLORIDE: 111 mmol/L (ref 101–111)
CO2: 23 mmol/L (ref 22–32)
CREATININE: 0.97 mg/dL (ref 0.61–1.24)
Calcium: 8.8 mg/dL — ABNORMAL LOW (ref 8.9–10.3)
GFR calc Af Amer: >60 mL/min (ref >60)
GFR calc non Af Amer: >60 mL/min (ref >60)
Glucose, Bld: 171 mg/dL — ABNORMAL HIGH (ref 65–99)
POTASSIUM: 3.9 mmol/L (ref 3.5–5.1)
SODIUM: 141 mmol/L (ref 135–145)

## 2016-02-19 LAB — GLUCOSE, CAPILLARY: Glucose-Capillary: 172 mg/dL — ABNORMAL HIGH (ref 65–99)

## 2016-02-19 MED ORDER — PANTOPRAZOLE SODIUM 40 MG PO TBEC: 40.0000 mg | DELAYED_RELEASE_TABLET | Freq: Every day | ORAL | Status: DC

## 2016-02-19 MED ORDER — SUCRALFATE 1 G PO TABS
1.0000 g | ORAL_TABLET | Freq: Four times a day (QID) | ORAL | Status: DC
  Administered 2016-02-19: 1 g via ORAL
  Filled 2016-02-19: qty 1

## 2016-02-19 MED ORDER — SUCRALFATE 1 G PO TABS: 1.0000 g | ORAL_TABLET | Freq: Four times a day (QID) | ORAL | Status: DC

## 2016-02-19 NOTE — Discharge Summary (Signed)
Name: Walter French MRN: [id number] DOB: 02/15/1946 70 y.o. PCP: Karlene Einstein, MD  Date of Admission: 02/17/2016  9:19 PM Date of Discharge: 02/22/2016 Attending Physician: Oval Linsey, MD  Discharge Diagnosis: 1. Upper GI Bleed secondary to severe radiation esophagitis and gastritis 2. Hx of Esophageal Adenocarcinoma s/p Radiation and Chemotherapy 3. Thrombocytopenia 4. DM Type II 5. History of Amiodarone Pulmonary Toxicity 6. Systolic and Diastolic CHF with ICD 7. CAD 8. PAD 9. Atrial Fibrillation 10. Hypothyroidism 11. Gout 12. HLD  Discharge Medications:   Medication List    STOP taking these medications        ranitidine 300 MG tablet  Commonly known as:  ZANTAC      TAKE these medications        allopurinol 300 MG tablet  Commonly known as:  ZYLOPRIM  Take 300 mg by mouth daily.     apixaban 5 MG Tabs tablet  Commonly known as:  ELIQUIS  Take 1 tablet (5 mg total) by mouth 2 (two) times daily.     atorvastatin 40 MG tablet  Commonly known as:  LIPITOR  Take 1 tablet (40 mg total) by mouth daily.     carvedilol 6.25 MG tablet  Commonly known as:  COREG  take 1 tablet by mouth twice a day with food     digoxin 0.125 MG tablet  Commonly known as:  LANOXIN  Take 1 tablet (0.125 mg total) by mouth daily.     furosemide 40 MG tablet  Commonly known as:  LASIX  Take 1 tablet (40 mg total) by mouth daily.     levothyroxine 75 MCG tablet  Commonly known as:  SYNTHROID, LEVOTHROID  Take 75 mcg by mouth daily before breakfast.     LORazepam 0.5 MG tablet  Commonly known as:  ATIVAN  Take 1 tablet (0.5 mg total) by mouth every 8 (eight) hours.     nitroGLYCERIN 0.4 MG SL tablet  Commonly known as:  NITROSTAT  Place 1 tablet (0.4 mg total) under the tongue every 5 (five) minutes x 3 doses as needed for chest pain.     pantoprazole 40 MG tablet  Commonly known as:  PROTONIX  Take 1 tablet (40 mg total) by mouth daily.     potassium chloride SA  20 MEQ tablet  Commonly known as:  K-DUR,KLOR-CON  Take 1 tablet (20 mEq total) by mouth daily.     sucralfate 1 g tablet  Commonly known as:  CARAFATE  Take 1 tablet (1 g total) by mouth 4 (four) times daily.        Disposition and follow-up:   Walter French was discharged from [facility] in Stable condition.  At the hospital follow up visit please address:  1.  Upper GI Bleed secondary to severe radiation esophagitis and gastritis: On Protonix PO 40 mg daily and Sucralfate QID indefinitely. Holding Eliquis, last dose 3/8 am. Restart at follow up with PCP on Tuesday. Will need to be followed closely with CBC  2.  Labs / imaging needed at time of follow-up: CBC  3.  Pending labs/ test needing follow-up: None  Follow-up Appointments:     Follow-up Information    Go to Karlene Einstein, MD.   Specialty:  Family Medicine   Why:  Hospital follow up at 1:30 pm with Ms. Walke (Dr. Vista Lawman' PA)   Contact information:   [address] [place] [address] [phone]  Discharge Instructions: Discharge Instructions    Call MD for:  difficulty breathing, headache or visual disturbances    Complete by:  As directed      Call MD for:  persistant dizziness or light-headedness    Complete by:  As directed      Call MD for:  persistant nausea and vomiting    Complete by:  As directed      Call MD for:  severe uncontrolled pain    Complete by:  As directed      Call MD for:  temperature >100.4    Complete by:  As directed      Diet - low sodium heart healthy    Complete by:  As directed      Increase activity slowly    Complete by:  As directed            Consultations: Treatment Team:  Ronald Lobo, MD  Procedures Performed:  Upper endoscopy 02/18/2016: - Moderately severe radiation esophagitis. - Non-bleeding gastric ulcer with a clean ulcer base (Forrest Class III), corresponding to previous tumor site. No evidence that this was the  source of the bleeding. - Erythematous mucosa in the stomach with focal mucosal hemorrhage. - Erythematous duodenopathy. - No specimens collected. - It appears that the source of the patient's bleeding is his hemorrhagic gastritis, the bleeding from which would presumably have been magnified had he still been on his anticoagulation. It does not appear that the ulceration at his previous tumor site is the source of his bleeding.   Admission HPI: 70 year old man with history of gastroesophageal adenocarcinoma s/p radiation and chemotherapy, systolic CHF EF [id number], A. Fib on Eliquis, CAD s/p CABG, PAD, DM type II, OSA, Gout, HTN, HLD, Obesity presenting today with melena. States that he has been having dark black tarry stool for the past 2 months. He reports he saw his PCP today and was told his Hgb was 7.0 and to come to the ED for evaluation. He denies any abdominal pain, nausea/vomiting, hematochezia, hematemesis, chest pain or fatigue. Reports shortness of breath is unchanged from baseline. Denies any NSAID use. Patient is on Eliquis for A. Fib. Patient was diagnosed with adenocarcinoma of the gastroesophageal junction in 07/2015. He underwent radiation therapy as well as taxol/carboplatin chemotherapy and completed treatment in October.   Hospital Course by problem list:   Upper GI Bleed secondary to severe radiation esophagitis and gastritis: FOBT was positive in the ED. He was hemodynamically stable and asymptomatic in the ED. He was admitted to telemetry. His Eliquis was held. Hgb on admission 8.5. Received 2u pRBC 3/9. He underwent EGD and was found to have severe radiation esophagitis, nonbleeding gastric ulcer, focal mucosal hemorrhage of the stomach. Post procedure course was unremarkable. He remained hemodynamically stable.  Hgb on discharge up to 9.2 from 8.8. He was continued on Protonix PO 40 mg daily and Sucralfate QID indefinitely. Holding Eliquis, last dose 3/8 am. Restart at follow up with  PCP on Tuesday. Will need to be followed closely with CBC  Discharge Vitals:   BP 123/66 mmHg  Pulse 93  Temp(Src) 98.4 F (36.9 C) (Oral)  Resp 16  Wt 189 lb 9.5 oz (86 kg)  SpO2 94%  Discharge Labs:  Basic Metabolic Panel:  Last Labs      Recent Labs Lab 02/17/16 1742 02/19/16 0556  NA 141 141  K 4.0 3.9  CL 110 111  CO2 23 23  GLUCOSE 113* 171*  BUN 15 11  CREATININE 1.09 0.97  CALCIUM 9.1 8.8*     Liver Function Tests:  Last Labs      Recent Labs Lab 02/17/16 1742  AST 22  ALT 8*  ALKPHOS 164*  BILITOT 0.5  PROT 5.7*  ALBUMIN 3.0*     CBC:  Last Labs      Recent Labs Lab 02/18/16 1951 02/19/16 0556  WBC 3.1* 3.3*  HGB 8.8* 9.2*  HCT 27.2* 29.5*  MCV 87.7 89.4  PLT 91* 99*     Coagulation:  Last Labs      Recent Labs Lab 02/17/16 1742  LABPROT 16.7*  INR 1.34          Signed: Milagros Loll, MD 02/22/2016, 3:26 PM    Services Ordered on Discharge: None Equipment Ordered on Discharge: None

## 2016-02-19 NOTE — Progress Notes (Signed)
Internal Medicine Attending  Date: 02/19/2016  Patient name: Walter French Medical record number: [id number] Date of birth: 1946/05/03 Age: 70 y.o. Gender: male  I saw and evaluated the patient. I reviewed the resident's note by Dr. Randell Patient and I agree with the resident's findings and plans as documented in her progress note.  [name] was found to have radiation esophagitis and gastritis as the likely cause for his upper GI bleeding in the setting of Eliquis. His hemoglobin has remained stable overnight and he continues to feel well. We are holding the anticoagulation until he is seen by his primary care provider on Tuesday. He was educated on what signs and symptoms to look for that may suggest he has started to rebleed. He was asked to call his primary care provider immediately should he discover any of those signs or symptoms. He is stable for discharge home today.

## 2016-02-19 NOTE — Progress Notes (Signed)
Subjective: Walter French did well overnight with no acute complaints. He tolerated his EGD well. He denies any further episodes of melena overnight. He also denies chest pain, SOB. Objective: Vital signs in last 24 hours: Filed Vitals:   02/18/16 1718 02/18/16 2013 02/19/16 0500 02/19/16 0535  BP: 127/66 114/50  123/66  Pulse: 78 79  93  Temp: 98.2 F (36.8 C) 97.9 F (36.6 C)  98.4 F (36.9 C)  TempSrc: Oral Oral  Oral  Resp: 20 19  16   Weight:   86 kg (189 lb 9.5 oz)   SpO2: 97% 96%  94%   Weight change:   Intake/Output Summary (Last 24 hours) at 02/19/16 1212 Last data filed at 02/19/16 0646  Gross per 24 hour  Intake    480 ml  Output      0 ml  Net    480 ml   BP 123/66 mmHg  Pulse 93  Temp(Src) 98.4 F (36.9 C) (Oral)  Resp 16  Wt 86 kg (189 lb 9.5 oz)  SpO2 94% General appearance: alert, cooperative, appears stated age and no distress Head: Normocephalic, without obvious abnormality, atraumatic Eyes: Clear conjunctiva with moist mucous membranes Lungs: Clear to auscultation bilaterally with normal respiratory effort. No wheezes or crackles. Heart: regular rate and rhythm, S1, S2 normal, no murmur, click, rub or gallop Abdomen: Soft, nontender, nondistended, no tenderness to percussion Extremities: 1+ pitting edema to knees bilaterally Lab Results: Basic Metabolic Panel:  Recent Labs  02/17/16 1742 02/19/16 0556  NA 141 141  K 4.0 3.9  CL 110 111  CO2 23 23  GLUCOSE 113* 171*  BUN 15 11  CREATININE 1.09 0.97  CALCIUM 9.1 8.8*   Liver Function Tests:  Recent Labs  02/17/16 1742  AST 22  ALT 8*  ALKPHOS 164*  BILITOT 0.5  PROT 5.7*  ALBUMIN 3.0*   CBC:  Recent Labs  02/18/16 1951 02/19/16 0556  WBC 3.1* 3.3*  HGB 8.8* 9.2*  HCT 27.2* 29.5*  MCV 87.7 89.4  PLT 91* 99*   CBG:  Recent Labs  02/19/16 0612  GLUCAP 172*   Coagulation:  Recent Labs  02/17/16 1742  LABPROT 16.7*  INR 1.34    Medications: I have reviewed the  patient's current medications. Scheduled Meds: . allopurinol  300 mg Oral Daily  . atorvastatin  40 mg Oral Daily  . carvedilol  6.25 mg Oral BID WC  . digoxin  0.125 mg Oral Daily  . furosemide  20 mg Intravenous Once  . levothyroxine  75 mcg Oral QAC breakfast  . pantoprazole  40 mg Oral Daily  . sodium chloride flush  3 mL Intravenous Q12H  . sucralfate  1 g Oral 4 times per day   Continuous Infusions:  PRN Meds:.nitroGLYCERIN, sodium chloride flush Assessment/Plan: Principal Problem:   Upper GI bleed Active Problems:   Coronary artery disease-status post CABG   Automatic implantable cardioverter-defibrillator in situ   Systolic and diastolic CHF, chronic (HCC)   PAF-    Thrombocytopenia (HCC)   Type 2 diabetes mellitus with renal manifestations (HCC)   Hypothyroidism   Gout   Chronic anticoagulation - Eliquis, CHADS2VASC=5   Cardiomyopathy, ischemic-30-35%   Adenocarcinoma of esophagus (HCC)   Anemia due to blood loss  Upper GI Bleed He has a 2 month history of melena. Hgb 7.0 at PCP two days ago, was 8.5 on admission, dropped to 6.8 on recheck overnight on first night of admission. Eliquis that he's on for PAF  was d/c'ed. Transfused one pRBC overnight first night of admission and one more pRBC yesterday afternoon. Post transfusion cbc showed hgb improvement to 8.8. Subsequently improved to 9.2. Hx of esophageal adenocarcinoma treated with radiation and chemotherapy 07/2015-09/2015. GI consulted and performed EGD, which showed source of bleeding is most likely hemorrhagic gastritis possibly secondary to his radiation therapy. Esophageal adenocarcinoma had pretty much resolved. GI recommended high-dose PPI and sucralfate to heal gastritis overtime with restart of anticoagulation, watching for evidence of rebleeding -Protonix po 40 mg daily -sucralfate po 1g qid -continue telemetry -Type and screen -holding eliquis, last dose 3/8 am  Hx of esophageal adenocarcinoma [id number].  Underwent radiation and chemotherapy with taxol/carboplatin with last cycle on 09/2015.  Thrombocytopenia. Platelets 85 on admission down from 151 1 month ago. 99 today.   Type II DM Was recently admitted on 10/2015 with hypoglycemia in the 20s despite recently being taken off insulin and transitioned to glipizide. All diabetic medications were stopped and hypoglycemia was attributed to malignancy associated weight loss. HgbA1c was 5.6 at that time.  -Monitor blood glucose  Hx of amiodarone pulmonary toxicity Admitted on 10/2015 with acute hypoxic respiratory failure. Followed by Dr. Melvyn Novas. PFTs done at 3/1 office visit with VC 2.81 57% without sign of airflow obstruction with DLCO 29/36% corrects to 63% for alv volume.  Systolic and Diastolic CHF with ICD placement Last echo 07/2015 with 30-35% EF, moderate LVH and grade 2 diastolic dysfunction. On Lasix 40mg  daily and coreg 6.25 mg bid -Restart Lasix 40 mg daily -continue coreg 6.25 mg bid  CAD s/p CABG Stable.  -continue lipitor 40mg  daily -continue coreg 6.25 mg bid  PAD ABIs in 12/2015 showed patent stent with borderline ABI on the right side at 0.93 and normal on the left  Afib s/p DCCV in 10/2015 NSR yesterday on admission. Currently takes apixiban, digoxin, and coreg as an outpatient. Amiodarone d/c'ed previously due to pulmonary toxicity. Last apixiban was 3/8 AM and is currently held. CHADS-Vasc score is 5. Cardiology recommended Tikosyn if tachycardia recurs. -Apixiban held, will defer restart of this medication to his PCP -continue coreg 6.25 mg bid and digoxin 0.125 mg daily  Hypothyroidism: TSH 0.588 10/2015. On levothyroxine. -continue home levothyroxine 75 mcg daily  Gout: Takes allopurinol -Continue allopurinol 300 mg daily  HLD:  -continue atorvastatin 40 mg daily  DVT PPx: SCDs  Code: Full  Dispo: Patient will be discharged to home today.    This is a Careers information officer Note.  The care of the patient was  discussed with Dr. Randell Patient and the assessment and plan formulated with their assistance.  Please see their attached note for official documentation of the daily encounter.   LOS: 1 day   Domingo Madeira, Med Student 02/19/2016, 12:12 PM

## 2016-02-19 NOTE — Discharge Instructions (Signed)
Please take pantoprazole (Protonix) 1 tablet daily and sucralfate (carafate) 1 tablet four times a day indefinitely unless otherwise instructed by your doctors. Hold off on your blood thinner (Eliquis/apixaban) until you see your primary care doctor.   Gastrointestinal Bleeding Gastrointestinal bleeding is bleeding somewhere along the path that food travels through the body (digestive tract). This path is anywhere between the mouth and the opening of the butt (anus). You may have blood in your throw up (vomit) or in your poop (stools). If there is a lot of bleeding, you may need to stay in the hospital.  GET HELP RIGHT AWAY IF:   Your bleeding gets worse.  You feel dizzy, weak, or you pass out (faint).  You have bad cramps in your back or belly (abdomen).  You have large blood clumps (clots) in your poop or black, tarry poop.  Your problems are getting worse. MAKE SURE YOU:   Understand these instructions.  Will watch your condition.  Will get help right away if you are not doing well or get worse.   This information is not intended to replace advice given to you by your health care provider. Make sure you discuss any questions you have with your health care provider.   Document Released: 09/06/2008 Document Revised: 11/14/2012 Document Reviewed: 05/18/2015 Elsevier Interactive Patient Education [employer].

## 2016-02-19 NOTE — Progress Notes (Signed)
Subjective: Doing well this morning. He reports he is at his baseline overall. He had a bowel movement this morning that he describes as dark brown but less dark than his bowel movements prior to hospitalization.   Objective: Vital signs in last 24 hours: Filed Vitals:   02/18/16 1718 02/18/16 2013 02/19/16 0500 02/19/16 0535  BP: 127/66 114/50  123/66  Pulse: 78 79  93  Temp: 98.2 F (36.8 C) 97.9 F (36.6 C)  98.4 F (36.9 C)  TempSrc: Oral Oral  Oral  Resp: 20 19  16   Weight:   189 lb 9.5 oz (86 kg)   SpO2: 97% 96%  94%   Weight change:   Intake/Output Summary (Last 24 hours) at 02/19/16 1035 Last data filed at 02/19/16 0646  Gross per 24 hour  Intake    480 ml  Output      0 ml  Net    480 ml   General Apperance: NAD HEENT: Normocephalic, atraumatic, anicteric sclera Neck: Supple, trachea midline Lungs: Clear to auscultation bilaterally. No wheezes, rhonchi or rales. Breathing comfortably Heart: Regular rate and rhythm, no murmur/rub/gallop Abdomen: Soft, nontender, nondistended, no rebound/guarding Extremities: Warm and well perfused, 1+ BLE edema Skin: No rashes or lesions Neurologic: Alert and interactive. No gross deficits.  Lab Results: Basic Metabolic Panel:  Recent Labs Lab 02/17/16 1742 02/19/16 0556  NA 141 141  K 4.0 3.9  CL 110 111  CO2 23 23  GLUCOSE 113* 171*  BUN 15 11  CREATININE 1.09 0.97  CALCIUM 9.1 8.8*   Liver Function Tests:  Recent Labs Lab 02/17/16 1742  AST 22  ALT 8*  ALKPHOS 164*  BILITOT 0.5  PROT 5.7*  ALBUMIN 3.0*   CBC:  Recent Labs Lab 02/18/16 1951 02/19/16 0556  WBC 3.1* 3.3*  HGB 8.8* 9.2*  HCT 27.2* 29.5*  MCV 87.7 89.4  PLT 91* 99*   Coagulation:  Recent Labs Lab 02/17/16 1742  LABPROT 16.7*  INR 1.34    Studies/Results: No results found.   Medications: I have reviewed the patient's current medications. Scheduled Meds: . allopurinol  300 mg Oral Daily  . atorvastatin  40 mg Oral  Daily  . carvedilol  6.25 mg Oral BID WC  . digoxin  0.125 mg Oral Daily  . furosemide  20 mg Intravenous Once  . levothyroxine  75 mcg Oral QAC breakfast  . pantoprazole (PROTONIX) IV  40 mg Intravenous Q12H  . sodium chloride flush  3 mL Intravenous Q12H  . sucralfate  1 g Oral TID WC & HS   Continuous Infusions:  PRN Meds:.nitroGLYCERIN, sodium chloride flush   Assessment/Plan: 70 year old man with history of gastroesophageal adenocarcinoma s/p radiation and chemotherapy, systolic CHF (EF 99991111), A. Fib on Eliquis, CAD s/p CABG, PAD, DM type II, OSA, Gout, HTN, HLD, obesity presenting today with melena.  Upper GI Bleed: Last Hgb was 10.6 on 3/1. Remains hemodynamically stable. Hgb on admission 8.5. Received 2u pRBC 3/9. Hgb today up to 9.2 from 8.8. Underwent EGD and found to have gastritis and esophagitis. -Protonix PO 40 mg daily and Sucralfate QID indefinitely -Holding Eliquis, last dose 3/8 am. Restart at follow up with PCP on Tuesday. -Will need to be followed closely with CBC  Hx of Esophageal Adenocarcinoma s/p Radiation and Chemotherapy: TXN2M0.  -Continue to monitor for now  Thrombocytopenia: Platelets 99 on admission. Previously 119 on 02/10/2016. Patient with upper GI bleed currently. Will continue to monitor.   DM Type II: diet  controlled -Continue to monitor  Systolic and Diastolic CHF with ICD placement: Last ECHO 07/2015 with EF 30-35%, moderate LVH and grade 2 diastolic dysfunction.  -Restart home lasix 40 mg daily  -Continue coreg 6.25 mg bid.   CAD s/p CABG: Stable. No chest pain or change in baseline shortness of breath. s/p CABG x 3 (VG->PDA, VG->PL, LIMA->LAD); Underwent left heart catheterization on 07/2015 which showed severe 3 vessel obstructive disease with patent grafts. Did have 50% stenosis of LIMA. He is currently on lasix 40 mg daily, Lipitor 40 mg daily, coreg 6.25 mg bid.  -Lasix per above -Continue Lipitor and coreg  Atrial Fibrillation s/p DCCV  in 10/2015: CHADS-VASc score is 5.  -Holding Apixaban for GI bleed -Continue Coreg and Digoxin  Hypothyroidism: TSH 0.588 10/2015. -Continue home dose levothyroxine   Gout: Continue home allopurinol 300 mg daily. HLD: Continue home atorvastatin 40 mg daily.  VTE ppx: SCDs Code: Full  Dispo: Likely home today  The patient does have a current PCP Karlene Einstein, MD) and does not need an [facility] hospital follow-up appointment after discharge.  The patient does not have transportation limitations that hinder transportation to clinic appointments.  .Services Needed at time of discharge: Y = Yes, Blank = No PT:   OT:   RN:   Equipment:   Other:     LOS: 1 day   Milagros Loll, MD 02/19/2016, 10:35 AM

## 2016-02-25 ENCOUNTER — Telehealth: Payer: Self-pay | Admitting: Internal Medicine

## 2016-02-25 ENCOUNTER — Ambulatory Visit (INDEPENDENT_AMBULATORY_CARE_PROVIDER_SITE_OTHER): Payer: PPO | Admitting: *Deleted

## 2016-02-25 DIAGNOSIS — Z95810 Presence of automatic (implantable) cardiac defibrillator: Secondary | ICD-10-CM

## 2016-02-25 DIAGNOSIS — I255 Ischemic cardiomyopathy: Secondary | ICD-10-CM

## 2016-02-25 NOTE — Telephone Encounter (Signed)
Spoke w/ pt and instructed him that he has to keep his monitor on until the bullseye target lights up. Pt verbalized understanding and stated that he turned his monitor off before the transmission was completed he is going to try again tonight.

## 2016-02-25 NOTE — Telephone Encounter (Signed)
New Message:    Pt wants to know if his transmission went through last night?

## 2016-03-02 ENCOUNTER — Encounter: Payer: Self-pay | Admitting: Cardiology

## 2016-03-02 ENCOUNTER — Ambulatory Visit (INDEPENDENT_AMBULATORY_CARE_PROVIDER_SITE_OTHER): Payer: PPO | Admitting: Cardiology

## 2016-03-02 VITALS — BP 128/62 | HR 86 | Ht 72.0 in | Wt 210.8 lb

## 2016-03-02 DIAGNOSIS — K922 Gastrointestinal hemorrhage, unspecified: Secondary | ICD-10-CM | POA: Diagnosis not present

## 2016-03-02 DIAGNOSIS — I509 Heart failure, unspecified: Secondary | ICD-10-CM | POA: Diagnosis not present

## 2016-03-02 DIAGNOSIS — K208 Other esophagitis without bleeding: Secondary | ICD-10-CM

## 2016-03-02 DIAGNOSIS — I2510 Atherosclerotic heart disease of native coronary artery without angina pectoris: Secondary | ICD-10-CM

## 2016-03-02 DIAGNOSIS — I4891 Unspecified atrial fibrillation: Secondary | ICD-10-CM | POA: Insufficient documentation

## 2016-03-02 DIAGNOSIS — I5042 Chronic combined systolic (congestive) and diastolic (congestive) heart failure: Secondary | ICD-10-CM | POA: Diagnosis not present

## 2016-03-02 DIAGNOSIS — I255 Ischemic cardiomyopathy: Secondary | ICD-10-CM

## 2016-03-02 DIAGNOSIS — I480 Paroxysmal atrial fibrillation: Secondary | ICD-10-CM | POA: Diagnosis not present

## 2016-03-02 DIAGNOSIS — Z95810 Presence of automatic (implantable) cardiac defibrillator: Secondary | ICD-10-CM | POA: Diagnosis not present

## 2016-03-02 DIAGNOSIS — I2589 Other forms of chronic ischemic heart disease: Secondary | ICD-10-CM | POA: Diagnosis not present

## 2016-03-02 LAB — BASIC METABOLIC PANEL
BUN: 10 mg/dL (ref 7–25)
CALCIUM: 8.8 mg/dL (ref 8.6–10.3)
CO2: 24 mmol/L (ref 20–31)
Chloride: 107 mmol/L (ref 98–110)
Creat: 0.94 mg/dL (ref 0.70–1.25)
GLUCOSE: 114 mg/dL — AB (ref 65–99)
Potassium: 4.3 mmol/L (ref 3.5–5.3)
SODIUM: 140 mmol/L (ref 135–146)

## 2016-03-02 LAB — CBC WITH DIFFERENTIAL/PLATELET
BASOS PCT: 0 % (ref 0–1)
Basophils Absolute: 0 10*3/uL (ref 0.0–0.1)
EOS ABS: 0.2 10*3/uL (ref 0.0–0.7)
Eosinophils Relative: 4 % (ref 0–5)
HCT: 29.9 % — ABNORMAL LOW (ref 39.0–52.0)
Hemoglobin: 9.4 g/dL — ABNORMAL LOW (ref 13.0–17.0)
Lymphocytes Relative: 14 % (ref 12–46)
Lymphs Abs: 0.6 10*3/uL — ABNORMAL LOW (ref 0.7–4.0)
MCH: 28.4 pg (ref 26.0–34.0)
MCHC: 31.4 g/dL (ref 30.0–36.0)
MCV: 90.3 fL (ref 78.0–100.0)
MONO ABS: 0.6 10*3/uL (ref 0.1–1.0)
MONOS PCT: 13 % — AB (ref 3–12)
MPV: 10.9 fL (ref 8.6–12.4)
NEUTROS PCT: 69 % (ref 43–77)
Neutro Abs: 3.1 10*3/uL (ref 1.7–7.7)
PLATELETS: 125 10*3/uL — AB (ref 150–400)
RBC: 3.31 MIL/uL — AB (ref 4.22–5.81)
RDW: 17.1 % — ABNORMAL HIGH (ref 11.5–15.5)
WBC: 4.5 10*3/uL (ref 4.0–10.5)

## 2016-03-02 NOTE — Progress Notes (Signed)
Margorie French Date of Birth: 10-18-1946 Medical Record [id number]  History of Present Illness: [name] is seen  for follow up of CAD, atrial fibrillation, and CHF. He has known CAD. He presented in 1997 with acute MI and refractory VT/VF with severe 3 vessel CAD. He underwent emergent CABG and had an ICD implanted. In 2008 he presented with recurrent angina and graft failure and had repeat CABG by Dr. Prescott Gum with LIMA to the LAD, and sequential SVG to the PDA and PLOM. He subsequently had  PCI of the anastomosis of the LIMA to the LAD. This was a difficult POBA procedure with modest success. In August 2016 he had his last cardiac cath due to chest pain and abnormal Myoview. This showed all grafts were patent and the LIMA anastomosis appeared improved. EF was 30-35% by Echo.   In the summer of 2016 he was diagnosed with adenoCA of the GE junction. He was treated with radiation and chemotherapy. He was admitted in September 2016 with sepsis and new onset Afib with RVR. He was started on anticoagulation with Eliquis and treated with rate control.   He was readmitted with persistent afib and worsening CHF approximately 3 weeks later. He had DCCV but did not hold NSR. He was loaded with amiodarone. In late November he was admitted with respiratory failure and amiodarone pulmonary toxicity. He did have successful DCCV on 11/09/15 but amiodarone had to be discontinued due to toxicity.   More recently he was hospitalized from 3/8-3/13/17 with an upper GI bleed. He noted dark black stools. CBC showed new anemia. He was transfused 2 units PRBCs. Dr. Cristina Gong did EGD that showed radiation induced esophagitis and gastritis. He was treated with PPI and sulcrafate. His esophageal cancer was no longer evident. His Eliquis was held.   Since DC he denies any problems. His SOB is at baseline. He denies any chest pain or palpitations. No edema. Stools are still dark and have not returned to normal. He does note  nocturia 5-6 x /night.   His other issues include HLD, gout, HTN, VT with ICD in place, OSA, DM and obesity. He has had angiography and stent to the right popliteal artery in Feb. 2014 by Dr. Fletcher Anon. Recent evaluation in February was stable.  He reports that his claudication now is really fairly mild. He does report an 40 lb weight loss since his cancer diagnosis.    Current Outpatient Prescriptions  Medication Sig Dispense Refill  . allopurinol (ZYLOPRIM) 300 MG tablet Take 300 mg by mouth daily.      [name] atorvastatin (LIPITOR) 40 MG tablet Take 1 tablet (40 mg total) by mouth daily. 90 tablet 3  . carvedilol (COREG) 6.25 MG tablet take 1 tablet by mouth twice a day with food 60 tablet 3  . digoxin (LANOXIN) 0.125 MG tablet Take 1 tablet (0.125 mg total) by mouth daily. 30 tablet 6  . furosemide (LASIX) 40 MG tablet Take 1 tablet (40 mg total) by mouth daily. 30 tablet 0  . levothyroxine (SYNTHROID, LEVOTHROID) 75 MCG tablet Take 75 mcg by mouth daily before breakfast.   0  . LORazepam (ATIVAN) 0.5 MG tablet Take 1 tablet (0.5 mg total) by mouth every 8 (eight) hours. (Patient taking differently: Take 0.5 mg by mouth every 8 (eight) hours as needed. ) 30 tablet 0  . nitroGLYCERIN (NITROSTAT) 0.4 MG SL tablet Place 1 tablet (0.4 mg total) under the tongue every 5 (five) minutes x 3 doses as needed  for chest pain. 25 tablet 3  . pantoprazole (PROTONIX) 40 MG tablet Take 1 tablet (40 mg total) by mouth daily. 30 tablet 0  . potassium chloride SA (K-DUR,KLOR-CON) 20 MEQ tablet Take 1 tablet (20 mEq total) by mouth daily. 30 tablet 11  . sucralfate (CARAFATE) 1 g tablet Take 1 tablet (1 g total) by mouth 4 (four) times daily. 120 tablet 0  . HYDROcodone-acetaminophen (NORCO/VICODIN) 5-325 MG tablet Take 1 tablet by mouth 4 (four) times daily as needed.  0   No current facility-administered medications for this visit.    Allergies  Allergen Reactions  . Adhesive [Tape] Other (See Comments)    Pulls  skin off / please use paper tape  . Levaquin [Levofloxacin In D5w] Nausea And Vomiting  . Morphine And Related Nausea And Vomiting    Past Medical History  Diagnosis Date  . Hyperlipidemia   . Hypertension   . Coronary heart disease     a. s/p CABG x 3 (VG->PDA, VG->PL, LIMA->LAD);  b. 2013 Abnl Myoview;  c. 2013 Cath: 3/3 patent grafts, occluded LCX which correlated w/ ischemia on MV->not amenable to PCI->Med Rx.  . Obesity   . CHF (congestive heart failure) ([place])   . VT (ventricular tachycardia) ([place])   . SVT (supraventricular tachycardia) ([place])   . PAD (peripheral artery disease) ([place])     Angiography in February of 2014: Moderate left iliac artery stenosis, mild to moderate diffuse right SFA disease. Severe proximal right popliteal artery stenosis with three-vessel runoff below the knee. Status post self-expanding stent placement to the proximal popliteal artery  . Implantable cardioverter-defibrillator Mdt   . Asthma     "small touch" (07/29/2013)  . History of pneumonia     "3-4 times; last time ~ 2003" (07/29/2013)  . OSA on CPAP   . GERD (gastroesophageal reflux disease)   . H/O hiatal hernia   . Gout   . Anxiety   . Allergy   . Stomach cancer ([place]) 07/23/2015    invasive adenocarcinoma ,esophagus ,distal   . Type II diabetes mellitus ([place])     on insulin  . Sleep apnea   . Hyperlipidemia   . PAD (peripheral artery disease) ([place])   . CHF (congestive heart failure) ([place])   . A-fib ([place])     a. 11/09/2015: successful DCCV  . CKD (chronic kidney disease), stage III   . S/P radiation therapy 08/12/15-09/24/15    Ge  junction 54 Gy    Past Surgical History  Procedure Laterality Date  . Tonsillectomy    . Cardiac catheterization    . Wrist fracture surgery Right   . Cataract extraction w/ intraocular lens implant Left   . Cardiac defibrillator placement  1997; ~ 2003  . Coronary artery bypass graft  1997; 2007    "X3" (07/29/2013)  . Nerve, tendon and artery repair  Left 09/20/2013    Procedure: IRRIGATION AND DEBRIDEMENT,Exploration and Repair of Ulnar/Digital  Artery Nerve of Left Index finger;  Surgeon: Roseanne Kaufman, MD;  Location: [facility];  Service: Orthopedics;  Laterality: Left;  . Abdominal aortagram N/A 01/30/2013    Procedure: ABDOMINAL Maxcine Ham;  Surgeon: Wellington Hampshire, MD;  Location: [facility] CATH LAB;  Service: Cardiovascular;  Laterality: N/A;  . Cardiac catheterization N/A 07/22/2015    Procedure: Left Heart Cath and Coronary Angiography;  Surgeon: Peter M Martinique, MD;  Location: [facility] CV LAB;  Service: Cardiovascular;  Laterality: N/A;  . Esophagogastroduodenoscopy N/A 07/23/2015    Procedure:  ESOPHAGOGASTRODUODENOSCOPY (EGD);  Surgeon: Wonda Horner, MD;  Location: [facility] ENDOSCOPY;  Service: Endoscopy;  Laterality: N/A;  . Tee without cardioversion N/A 10/06/2015    Procedure: TRANSESOPHAGEAL ECHOCARDIOGRAM (TEE);  Surgeon: Pixie Casino, MD;  Location: [facility] ENDOSCOPY;  Service: Cardiovascular;  Laterality: N/A;  . Cardioversion N/A 11/09/2015    Procedure: CARDIOVERSION;  Surgeon: Pixie Casino, MD;  Location: [facility] ENDOSCOPY;  Service: Cardiovascular;  Laterality: N/A;  . Esophagogastroduodenoscopy N/A 02/18/2016    Procedure: ESOPHAGOGASTRODUODENOSCOPY (EGD);  Surgeon: Ronald Lobo, MD;  Location: [facility] ENDOSCOPY;  Service: Endoscopy;  Laterality: N/A;    History  Smoking status  . Former Smoker -- 2.00 packs/day for 30 years  . Types: Cigarettes  . Quit date: 12/13/1995  Smokeless tobacco  . Never Used    History  Alcohol Use No    Comment: 07/29/2013 "not touched any alcohol since 1997"    Family History  Problem Relation Age of Onset  . Heart disease Brother   . Cancer Brother   . Breast cancer Mother     Review of Systems: The review of systems is per the HPI.  All other systems were reviewed and are negative.  Physical Exam: BP 128/62 mmHg  Pulse 86  Ht 6' (1.829 m)  Wt 95.618 kg (210 lb 12.8 oz)  BMI 28.58  kg/m2 Patient is very pleasant and in no acute distress. Skin is warm and dry. Color is normal.  HEENT is unremarkable. Normocephalic/atraumatic. PERRL. Sclera are nonicteric. Neck is supple. No masses. No JVD. Lungs are clear. Cardiac exam shows a regular rate and rhythm. No gallop. No murmur.  Abdomen is soft. Extremities are without edema. Feet are warm and dry. Gait and ROM are intact. No gross neurologic deficits noted.  LABORATORY DATA: Lab Results  Component Value Date   WBC 3.3* 02/19/2016   HGB 9.2* 02/19/2016   HCT 29.5* 02/19/2016   PLT 99* 02/19/2016   GLUCOSE 171* 02/19/2016   CHOL 138 07/30/2013   TRIG 321* 07/30/2013   HDL 25* 07/30/2013   LDLCALC 49 07/30/2013   ALT 8* 02/17/2016   AST 22 02/17/2016   NA 141 02/19/2016   K 3.9 02/19/2016   CL 111 02/19/2016   CREATININE 0.97 02/19/2016   BUN 11 02/19/2016   CO2 23 02/19/2016   TSH 0.588 11/09/2015   INR 1.34 02/17/2016   HGBA1C 5.6 11/12/2015    Assessment / Plan: 1. CAD - will continue with medical management. Last cath in August 2016 showed patent grafts and he is asymptomatic.    2. Atrial fibrillation. S/p DCCV in November 2016. Afib started in setting of sepsis and led to worsening CHF. Intolerant of amiodarone due to pulmonary toxicity. Fortunately maintaining NSR. Recent UGI bleed on Eliquis. Anticoagulation on hold for now. [place] Vasc score of 5. Will need to determine if we can resume anticoagulation. Will check CBC today. If back to normal could consider resuming anticoagulation with close follow up of CBC. Alternative would be to consider for a Watchman device but even this would require anticoagulation for a period of time. If Afib recurs would consider Tikosyn.  3. Chronic systolic CHF.  Ischemic CM EF 30-35%. Well compensated on current therapy. Continue current lasix and Coreg dose. Check chemistries. If renal function is good consider resuming an ACEi. This was stopped previously due to worsening renal  function during prior hospitalization with sepsis.   4. PAD - s/p  popliteal stent. Symptoms improved.  5. VT with underlying ICD -  followed by Dr. Caryl Comes.   6. DM - reports good control.  7. UGI bleed secondary to radiation induced esophagitis/gastritis. On lifelong PPI and sucralfate per GI. Check CBC today. GI felt if no further bleeding could consider cautious resumption of anticoagulation.

## 2016-03-02 NOTE — Patient Instructions (Signed)
Stay off Eliquis for now.   We will check lab work today   I will see you in 3 months.

## 2016-03-03 ENCOUNTER — Other Ambulatory Visit: Payer: Self-pay

## 2016-03-03 DIAGNOSIS — I5042 Chronic combined systolic (congestive) and diastolic (congestive) heart failure: Secondary | ICD-10-CM

## 2016-03-03 DIAGNOSIS — D638 Anemia in other chronic diseases classified elsewhere: Secondary | ICD-10-CM

## 2016-03-03 MED ORDER — FUROSEMIDE 40 MG PO TABS: 40.0000 mg | ORAL_TABLET | Freq: Every day | ORAL | Status: DC

## 2016-03-03 MED ORDER — LISINOPRIL 5 MG PO TABS: 5.0000 mg | ORAL_TABLET | Freq: Every day | ORAL | Status: DC

## 2016-03-03 MED ORDER — FERROUS SULFATE 325 (65 FE) MG PO TBEC: DELAYED_RELEASE_TABLET | ORAL | Status: DC

## 2016-03-07 NOTE — Progress Notes (Signed)
Remote ICD transmission.   

## 2016-03-10 ENCOUNTER — Observation Stay (HOSPITAL_COMMUNITY)
Admission: EM | Admit: 2016-03-10 | Discharge: 2016-03-11 | Disposition: A | Discharge status: Home / Self Care | Payer: PPO | Attending: Oncology | Admitting: Oncology

## 2016-03-10 ENCOUNTER — Encounter (HOSPITAL_COMMUNITY): Payer: Self-pay | Admitting: *Deleted

## 2016-03-10 DIAGNOSIS — Y842 Radiological procedure and radiotherapy as the cause of abnormal reaction of the patient, or of later complication, without mention of misadventure at the time of the procedure: Secondary | ICD-10-CM | POA: Insufficient documentation

## 2016-03-10 DIAGNOSIS — C160 Malignant neoplasm of cardia: Secondary | ICD-10-CM | POA: Diagnosis not present

## 2016-03-10 DIAGNOSIS — I5042 Chronic combined systolic (congestive) and diastolic (congestive) heart failure: Secondary | ICD-10-CM | POA: Diagnosis not present

## 2016-03-10 DIAGNOSIS — D612 Aplastic anemia due to other external agents: Secondary | ICD-10-CM | POA: Insufficient documentation

## 2016-03-10 DIAGNOSIS — K922 Gastrointestinal hemorrhage, unspecified: Secondary | ICD-10-CM | POA: Diagnosis present

## 2016-03-10 DIAGNOSIS — M109 Gout, unspecified: Secondary | ICD-10-CM | POA: Diagnosis not present

## 2016-03-10 DIAGNOSIS — D649 Anemia, unspecified: Secondary | ICD-10-CM | POA: Diagnosis not present

## 2016-03-10 DIAGNOSIS — I4819 Other persistent atrial fibrillation: Secondary | ICD-10-CM | POA: Diagnosis present

## 2016-03-10 DIAGNOSIS — R531 Weakness: Secondary | ICD-10-CM | POA: Diagnosis not present

## 2016-03-10 DIAGNOSIS — I110 Hypertensive heart disease with heart failure: Secondary | ICD-10-CM | POA: Insufficient documentation

## 2016-03-10 DIAGNOSIS — T451X5S Adverse effect of antineoplastic and immunosuppressive drugs, sequela: Secondary | ICD-10-CM

## 2016-03-10 DIAGNOSIS — Z87891 Personal history of nicotine dependence: Secondary | ICD-10-CM | POA: Diagnosis not present

## 2016-03-10 DIAGNOSIS — Z85028 Personal history of other malignant neoplasm of stomach: Secondary | ICD-10-CM

## 2016-03-10 DIAGNOSIS — D61810 Antineoplastic chemotherapy induced pancytopenia: Secondary | ICD-10-CM

## 2016-03-10 DIAGNOSIS — K208 Other esophagitis without bleeding: Secondary | ICD-10-CM | POA: Diagnosis present

## 2016-03-10 DIAGNOSIS — Z951 Presence of aortocoronary bypass graft: Secondary | ICD-10-CM

## 2016-03-10 DIAGNOSIS — E119 Type 2 diabetes mellitus without complications: Secondary | ICD-10-CM | POA: Insufficient documentation

## 2016-03-10 DIAGNOSIS — E785 Hyperlipidemia, unspecified: Secondary | ICD-10-CM | POA: Insufficient documentation

## 2016-03-10 DIAGNOSIS — Z955 Presence of coronary angioplasty implant and graft: Secondary | ICD-10-CM | POA: Diagnosis not present

## 2016-03-10 DIAGNOSIS — F1021 Alcohol dependence, in remission: Secondary | ICD-10-CM

## 2016-03-10 DIAGNOSIS — Z8501 Personal history of malignant neoplasm of esophagus: Secondary | ICD-10-CM | POA: Insufficient documentation

## 2016-03-10 DIAGNOSIS — Z794 Long term (current) use of insulin: Secondary | ICD-10-CM | POA: Diagnosis not present

## 2016-03-10 DIAGNOSIS — Z923 Personal history of irradiation: Secondary | ICD-10-CM | POA: Insufficient documentation

## 2016-03-10 DIAGNOSIS — K2960 Other gastritis without bleeding: Secondary | ICD-10-CM

## 2016-03-10 DIAGNOSIS — Z9221 Personal history of antineoplastic chemotherapy: Secondary | ICD-10-CM | POA: Insufficient documentation

## 2016-03-10 DIAGNOSIS — I2510 Atherosclerotic heart disease of native coronary artery without angina pectoris: Secondary | ICD-10-CM | POA: Insufficient documentation

## 2016-03-10 DIAGNOSIS — Z95810 Presence of automatic (implantable) cardiac defibrillator: Secondary | ICD-10-CM

## 2016-03-10 DIAGNOSIS — I481 Persistent atrial fibrillation: Secondary | ICD-10-CM | POA: Diagnosis not present

## 2016-03-10 DIAGNOSIS — D696 Thrombocytopenia, unspecified: Secondary | ICD-10-CM | POA: Diagnosis not present

## 2016-03-10 DIAGNOSIS — Z7901 Long term (current) use of anticoagulants: Secondary | ICD-10-CM | POA: Diagnosis not present

## 2016-03-10 DIAGNOSIS — I255 Ischemic cardiomyopathy: Secondary | ICD-10-CM | POA: Insufficient documentation

## 2016-03-10 DIAGNOSIS — I4891 Unspecified atrial fibrillation: Secondary | ICD-10-CM

## 2016-03-10 DIAGNOSIS — R0989 Other specified symptoms and signs involving the circulatory and respiratory systems: Secondary | ICD-10-CM

## 2016-03-10 DIAGNOSIS — D619 Aplastic anemia, unspecified: Secondary | ICD-10-CM | POA: Insufficient documentation

## 2016-03-10 DIAGNOSIS — E039 Hypothyroidism, unspecified: Secondary | ICD-10-CM | POA: Diagnosis not present

## 2016-03-10 DIAGNOSIS — E1151 Type 2 diabetes mellitus with diabetic peripheral angiopathy without gangrene: Secondary | ICD-10-CM

## 2016-03-10 DIAGNOSIS — T66XXXA Radiation sickness, unspecified, initial encounter: Secondary | ICD-10-CM | POA: Insufficient documentation

## 2016-03-10 DIAGNOSIS — E1129 Type 2 diabetes mellitus with other diabetic kidney complication: Secondary | ICD-10-CM | POA: Diagnosis present

## 2016-03-10 HISTORY — DX: Encounter for other specified aftercare: Z51.89

## 2016-03-10 LAB — CBC
HCT: 24.1 % — ABNORMAL LOW (ref 39.0–52.0)
Hemoglobin: 7.5 g/dL — ABNORMAL LOW (ref 13.0–17.0)
MCH: 28.7 pg (ref 26.0–34.0)
MCHC: 31.1 g/dL (ref 30.0–36.0)
MCV: 92.3 fL (ref 78.0–100.0)
PLATELETS: 149 10*3/uL — AB (ref 150–400)
RBC: 2.61 MIL/uL — ABNORMAL LOW (ref 4.22–5.81)
RDW: 17 % — AB (ref 11.5–15.5)
WBC: 5.2 10*3/uL (ref 4.0–10.5)

## 2016-03-10 LAB — COMPREHENSIVE METABOLIC PANEL
ALT: 8 U/L — AB (ref 17–63)
AST: 23 U/L (ref 15–41)
Albumin: 3.2 g/dL — ABNORMAL LOW (ref 3.5–5.0)
Alkaline Phosphatase: 155 U/L — ABNORMAL HIGH (ref 38–126)
Anion gap: 6 (ref 5–15)
BILIRUBIN TOTAL: 0.9 mg/dL (ref 0.3–1.2)
BUN: 14 mg/dL (ref 6–20)
CO2: 25 mmol/L (ref 22–32)
CREATININE: 1.08 mg/dL (ref 0.61–1.24)
Calcium: 8.9 mg/dL (ref 8.9–10.3)
Chloride: 109 mmol/L (ref 101–111)
Glucose, Bld: 104 mg/dL — ABNORMAL HIGH (ref 65–99)
POTASSIUM: 4 mmol/L (ref 3.5–5.1)
Sodium: 140 mmol/L (ref 135–145)
TOTAL PROTEIN: 6 g/dL — AB (ref 6.5–8.1)

## 2016-03-10 LAB — POC OCCULT BLOOD, ED: FECAL OCCULT BLD: POSITIVE — AB

## 2016-03-10 LAB — GLUCOSE, CAPILLARY: GLUCOSE-CAPILLARY: 86 mg/dL (ref 65–99)

## 2016-03-10 LAB — IRON AND TIBC
Iron: 58 ug/dL (ref 45–182)
SATURATION RATIOS: 16 % — AB (ref 17.9–39.5)
TIBC: 370 ug/dL (ref 250–450)
UIBC: 312 ug/dL

## 2016-03-10 LAB — PREPARE RBC (CROSSMATCH)

## 2016-03-10 LAB — FERRITIN: Ferritin: 35 ng/mL (ref 24–336)

## 2016-03-10 MED ORDER — SODIUM CHLORIDE 0.9 % IV SOLN: Freq: Once | INTRAVENOUS | Status: DC

## 2016-03-10 MED ORDER — HYDROCODONE-ACETAMINOPHEN 5-325 MG PO TABS: 1.0000 | ORAL_TABLET | Freq: Four times a day (QID) | ORAL | Status: DC | PRN

## 2016-03-10 MED ORDER — CARVEDILOL 6.25 MG PO TABS
6.2500 mg | ORAL_TABLET | Freq: Two times a day (BID) | ORAL | Status: DC
  Administered 2016-03-11: 6.25 mg via ORAL
  Filled 2016-03-10: qty 1

## 2016-03-10 MED ORDER — SUCRALFATE 1 G PO TABS
1.0000 g | ORAL_TABLET | Freq: Four times a day (QID) | ORAL | Status: DC
  Administered 2016-03-11 (×2): 1 g via ORAL
  Filled 2016-03-10 (×2): qty 1

## 2016-03-10 MED ORDER — ALLOPURINOL 300 MG PO TABS
300.0000 mg | ORAL_TABLET | Freq: Every day | ORAL | Status: DC
  Administered 2016-03-11: 300 mg via ORAL
  Filled 2016-03-10: qty 3

## 2016-03-10 MED ORDER — PANTOPRAZOLE SODIUM 40 MG PO TBEC: 40.0000 mg | DELAYED_RELEASE_TABLET | Freq: Every day | ORAL | Status: DC

## 2016-03-10 MED ORDER — DIGOXIN 125 MCG PO TABS
0.1250 mg | ORAL_TABLET | Freq: Every day | ORAL | Status: DC
  Administered 2016-03-11: 0.125 mg via ORAL
  Filled 2016-03-10: qty 1

## 2016-03-10 MED ORDER — ATORVASTATIN CALCIUM 40 MG PO TABS
40.0000 mg | ORAL_TABLET | Freq: Every day | ORAL | Status: DC
  Administered 2016-03-11: 40 mg via ORAL
  Filled 2016-03-10: qty 1

## 2016-03-10 MED ORDER — FERROUS SULFATE 325 (65 FE) MG PO TABS
325.0000 mg | ORAL_TABLET | Freq: Every day | ORAL | Status: DC
  Administered 2016-03-11: 325 mg via ORAL
  Filled 2016-03-10: qty 1

## 2016-03-10 MED ORDER — LEVOTHYROXINE SODIUM 75 MCG PO TABS
75.0000 ug | ORAL_TABLET | Freq: Every day | ORAL | Status: DC
  Administered 2016-03-11: 75 ug via ORAL
  Filled 2016-03-10: qty 1

## 2016-03-10 MED ORDER — FUROSEMIDE 40 MG PO TABS
40.0000 mg | ORAL_TABLET | Freq: Every day | ORAL | Status: DC
  Filled 2016-03-10: qty 1

## 2016-03-10 MED ORDER — LISINOPRIL 5 MG PO TABS
5.0000 mg | ORAL_TABLET | Freq: Every day | ORAL | Status: DC
  Administered 2016-03-11: 5 mg via ORAL
  Filled 2016-03-10: qty 1

## 2016-03-10 NOTE — H&P (Signed)
Date: 03/10/2016               Patient Name:  Walter French MRN: [id number]  DOB: [birth year] Age / Sex: 70 y.o., male   PCP: Karlene Einstein, MD         Medical Service: Internal Medicine Teaching Service         Attending Physician: Dr. Annia Belt, MD    First Contact: Dr. Zada Finders Pager: [phone]  Second Contact: Dr. Charlott Rakes Pager: [phone]       After Hours (After 5p/  First Contact Pager: [phone]  weekends / holidays): Second Contact Pager: [phone]   Chief Complaint: Weakness  History of Present Illness: Walter French is a 70 year old male with a history of TXN2M0 esophageal adenocarcinoma status post radiation therapy and chemotherapy as he was not felt to be a surgical candidate secondary to coronary artery disease status post bypass graft, chronic systolic heart failure with EF 30%, atrial fibrillation, peripheral artery disease, hypertension, hyperlipidemia, OSA, type 2 diabetes resolved with 86 pound weight loss associated with his esophageal cancer presents today with complaints of weakness. Patient was admitted earlier this month with symptomatic anemia from an upper GI bleed. He underwent an EGD at that time and was found to have severe radiation esophagitis, non-bleeding gastric ulcer, focal mucosal hemorrhage of the stomach. His esophageal cancer was no longer evident. He was started on Protonix 40 mg daily and Sucralfate qid at that time and Eliquis was stopped. He reports that he did not sleep well last night and was feeling weak and tired today so he called his PCP. He was instructed to come to the ED to have his hemoglobin checked. He reports only mild generalized weakness. Denies any chest pain, palpations, lightheadedness, dizziness, change in shortness of breath from his baseline, abdominal pain, nausea, vomiting, hematemesis, hematochezia. Does note some dark stools but reports being on iron supplement. No change in stool consistency.   Meds: Current  Facility-Administered Medications  Medication Dose Route Frequency Provider Last Rate Last Dose  . 0.9 %  sodium chloride infusion   Intravenous Once Juluis Mire, MD       Current Outpatient Prescriptions  Medication Sig Dispense Refill  . allopurinol (ZYLOPRIM) 300 MG tablet Take 300 mg by mouth daily.      [name] atorvastatin (LIPITOR) 40 MG tablet Take 1 tablet (40 mg total) by mouth daily. 90 tablet 3  . carvedilol (COREG) 6.25 MG tablet take 1 tablet by mouth twice a day with food 60 tablet 3  . digoxin (LANOXIN) 0.125 MG tablet Take 1 tablet (0.125 mg total) by mouth daily. 30 tablet 6  . ferrous sulfate 325 (65 FE) MG EC tablet Take 325 mg daily 30 tablet 6  . furosemide (LASIX) 40 MG tablet Take 1 tablet (40 mg total) by mouth daily. 30 tablet 6  . HYDROcodone-acetaminophen (NORCO/VICODIN) 5-325 MG tablet Take 1 tablet by mouth every 6 (six) hours as needed for moderate pain.   0  . levothyroxine (SYNTHROID, LEVOTHROID) 75 MCG tablet Take 75 mcg by mouth daily before breakfast.   0  . lisinopril (PRINIVIL,ZESTRIL) 5 MG tablet Take 1 tablet (5 mg total) by mouth daily. 30 tablet 6  . LORazepam (ATIVAN) 0.5 MG tablet Take 1 tablet (0.5 mg total) by mouth every 8 (eight) hours. (Patient taking differently: Take 0.5 mg by mouth every 8 (eight) hours as needed. ) 30 tablet 0  . nitroGLYCERIN (NITROSTAT) 0.4 MG SL  tablet Place 1 tablet (0.4 mg total) under the tongue every 5 (five) minutes x 3 doses as needed for chest pain. 25 tablet 3  . pantoprazole (PROTONIX) 40 MG tablet Take 1 tablet (40 mg total) by mouth daily. 30 tablet 0  . potassium chloride SA (K-DUR,KLOR-CON) 20 MEQ tablet Take 1 tablet (20 mEq total) by mouth daily. 30 tablet 11  . sucralfate (CARAFATE) 1 g tablet Take 1 tablet (1 g total) by mouth 4 (four) times daily. 120 tablet 0    Allergies: Allergies as of 03/10/2016 - Review Complete 03/10/2016  Allergen Reaction Noted  . Adhesive [tape] Other (See Comments) 09/20/2013    . Levaquin [levofloxacin in d5w] Nausea And Vomiting 09/14/2015  . Morphine and related Nausea And Vomiting 06/09/2011   Past Medical History  Diagnosis Date  . Hyperlipidemia   . Hypertension   . Coronary heart disease     a. s/p CABG x 3 (VG->PDA, VG->PL, LIMA->LAD);  b. 2013 Abnl Myoview;  c. 2013 Cath: 3/3 patent grafts, occluded LCX which correlated w/ ischemia on MV->not amenable to PCI->Med Rx.  . Obesity   . CHF (congestive heart failure) ([place])   . VT (ventricular tachycardia) ([place])   . SVT (supraventricular tachycardia) ([place])   . PAD (peripheral artery disease) ([place])     Angiography in February of 2014: Moderate left iliac artery stenosis, mild to moderate diffuse right SFA disease. Severe proximal right popliteal artery stenosis with three-vessel runoff below the knee. Status post self-expanding stent placement to the proximal popliteal artery  . Implantable cardioverter-defibrillator Mdt   . Asthma     "small touch" (07/29/2013)  . History of pneumonia     "3-4 times; last time ~ 2003" (07/29/2013)  . OSA on CPAP   . GERD (gastroesophageal reflux disease)   . H/O hiatal hernia   . Gout   . Anxiety   . Allergy   . Stomach cancer ([place]) 07/23/2015    invasive adenocarcinoma ,esophagus ,distal   . Type II diabetes mellitus ([place])     on insulin  . Sleep apnea   . Hyperlipidemia   . PAD (peripheral artery disease) ([place])   . CHF (congestive heart failure) ([place])   . A-fib ([place])     a. 11/09/2015: successful DCCV  . S/P radiation therapy 08/12/15-09/24/15    Ge  junction 54 Gy  . Blood transfusion without reported diagnosis    Past Surgical History  Procedure Laterality Date  . Tonsillectomy    . Cardiac catheterization    . Wrist fracture surgery Right   . Cataract extraction w/ intraocular lens implant Left   . Cardiac defibrillator placement  1997; ~ 2003  . Coronary artery bypass graft  1997; 2007    "X3" (07/29/2013)  . Nerve, tendon and artery repair Left  09/20/2013    Procedure: IRRIGATION AND DEBRIDEMENT,Exploration and Repair of Ulnar/Digital  Artery Nerve of Left Index finger;  Surgeon: Roseanne Kaufman, MD;  Location: [facility];  Service: Orthopedics;  Laterality: Left;  . Abdominal aortagram N/A 01/30/2013    Procedure: ABDOMINAL Maxcine Ham;  Surgeon: Wellington Hampshire, MD;  Location[facility] CATH LAB;  Service: Cardiovascular;  Laterality: N/A;  . Cardiac catheterization N/A 07/22/2015    Procedure: Left Heart Cath and Coronary Angiography;  Surgeon: Peter M Martinique, MD;  Location: [facility] CV LAB;  Service: Cardiovascular;  Laterality: N/A;  . Esophagogastroduodenoscopy N/A 07/23/2015    Procedure: ESOPHAGOGASTRODUODENOSCOPY (EGD);  Surgeon: Wonda Horner, MD;  Location: [facility]  ENDOSCOPY;  Service: Endoscopy;  Laterality: N/A;  . Tee without cardioversion N/A 10/06/2015    Procedure: TRANSESOPHAGEAL ECHOCARDIOGRAM (TEE);  Surgeon: Pixie Casino, MD;  Location: [facility] ENDOSCOPY;  Service: Cardiovascular;  Laterality: N/A;  . Cardioversion N/A 11/09/2015    Procedure: CARDIOVERSION;  Surgeon: Pixie Casino, MD;  Location: [facility] ENDOSCOPY;  Service: Cardiovascular;  Laterality: N/A;  . Esophagogastroduodenoscopy N/A 02/18/2016    Procedure: ESOPHAGOGASTRODUODENOSCOPY (EGD);  Surgeon: Ronald Lobo, MD;  Location: [facility] ENDOSCOPY;  Service: Endoscopy;  Laterality: N/A;   Family History  Problem Relation Age of Onset  . Heart disease Brother   . Cancer Brother   . Breast cancer Mother    Social History   Social History  . Marital Status: Single    Spouse Name: N/A  . Number of Children: 0  . Years of Education: N/A   Occupational History  . retired Surveyor, mining   .     Social History Main Topics  . Smoking status: Former Smoker -- 2.00 packs/day for 30 years    Types: Cigarettes    Quit date: 12/13/1995  . Smokeless tobacco: Never Used  . Alcohol Use: No     Comment: 07/29/2013 "not touched any alcohol since 1997"  . Drug Use: No  . Sexual  Activity: Not Currently   Other Topics Concern  . Not on file   Social History Narrative   Single, never married   Retired Administrator   Close friend, [name] and her children are his main support system   Independent in ADLs.    Review of Systems: Pertinent items noted in HPI and remainder of comprehensive ROS otherwise negative.  Physical Exam: Blood pressure 115/67, pulse 87, temperature 97.8 F (36.6 C), temperature source Oral, resp. rate 19, SpO2 100 %. General: alert, well-developed, and cooperative to examination.  Head: normocephalic and atraumatic.  Eyes: vision grossly intact, pupils equal, pupils round, pupils reactive to light, no injection and anicteric.  Mouth: pharynx pink and moist, no erythema, and no exudates.  Neck: supple, full ROM, no thyromegaly, no JVD, and no carotid bruits.  Lungs: normal respiratory effort, no accessory muscle use, normal breath sounds, bibasilar crackles, and no wheezes. Heart: normal rate, regular rhythm, no murmur, no gallop, and no rub.  Abdomen: soft, non-tender, normal bowel sounds, no distention, no guarding, no rebound tenderness Msk: no joint swelling, no joint warmth, and no redness over joints.  Pulses: could not palpate DT/PT pulses bilaterally Extremities: No cyanosis, clubbing, edema Neurologic: alert & oriented X3, cranial nerves II-XII intact, no focal deficits Skin: turgor normal and no rashes.  Psych: normal mood and affect   Lab results: Basic Metabolic Panel:  Recent Labs  03/10/16 1632  NA 140  K 4.0  CL 109  CO2 25  GLUCOSE 104*  BUN 14  CREATININE 1.08  CALCIUM 8.9   Liver Function Tests:  Recent Labs  03/10/16 1632  AST 23  ALT 8*  ALKPHOS 155*  BILITOT 0.9  PROT 6.0*  ALBUMIN 3.2*   CBC:  Recent Labs  03/10/16 1632  WBC 5.2  HGB 7.5*  HCT 24.1*  MCV 92.3  PLT 149*    Recent Labs  03/10/16 1835  FERRITIN 35  TIBC 370  IRON 58   Imaging results:  No results  found.  Other results: EKG: normal sinus rhythm, slight PR prolongation, no significant changes from previous  Assessment & Plan by Problem:  Upper GI Bleed secondary to severe radiation esophagitis  and gastritis: Patient with recent EGD on 3.9 that showed severe radiation esophagitis, non-bleeding gastric ulcer, focal mucosal hemorrhage of the stomach. His esophageal cancer was no longer evident. He was started on Protonix daily, Sucralfate qid and stopped his Eliquis. He reports some mild generalized weakness today. Hgb is 7.5 today, down from 9.4 8 days ago. Denies any chest pain, palpations, abdominal pain, hematochezia. Shortness of breath unchanged from baseline. Does has dark stools but reports taking iron pills. Hemodynamically stable. FOBT + in the ED. Giving 1 unit due to symptoms and monitoring CBC.  -Transfuse 1 unit PRBC -Telemetry -Post-transfusion CBC -Increase Protonix to bid -Continue Sulcrafate qid  Hx of Esophageal Adenocarcinoma s/p Radiation and Chemotherapy: TXN2M0. Patient had biopsy of a distal esophagus mass and gastric cardia ulcer in 07/2015 and found to have adenocarcinoma. PET staging showed hypermetabolic mass at the distal esophagus/GE junction and hypermetabolic paraesophageal, gastrohepatic and portacaval lymph nodes. He underwent radiation and chemotherapy with taxol/carboplatin with last cycle completing in 09/2015 as he was not felt to be a surgical candidate. Recent EGD done 3/9 showed no signs of recurrent cancer.  Atrial Fibrillation s/p DCCV in 10/2015: Patient in NSR today. Patient currently takes Digoxin and Coreg as an outpatient. Status post DCCV in November 2016. Previously on Amiodarone but discontinued secondary to pulmonary toxicity. Recent UGI bleed on Eliquis, anticoagulation on hold currently. CHADS-VASc score is 5. Cardiology recommended Tikosyn should his afib to recur.  -Continue Coreg and Digoxin  Thrombocytopenia: Platelets 149 on admission.  Improved from 99 at previous admission earlier this month. Will continue to monitor.  -CBC post transfusion  DM Type II: Patient was admitted in 10/2015 with hypoglycemia down to the 20s despite recently being taken off insulin and transitioned to glipizide. He was stopped of all diabetic medications that time with his hypoglycemia attributed to his malignancy associated weight loss (86 lbs). HgbA1c was 5.6 at that time. His TSH and am cortisol were both wnl at that time. -monitor CBGs  History of Amiodarone Pulmonary Toxicity: Patient admitted in 10/2015 and found to have acute hypoxic respiratory failure. He has followed with Dr. Melvyn Novas. ESR up to 125 on 11/16/15 and down to 85 on 12/22/15 with treatment with steroids. PFTs done at 3/1 office visit with VC 2.81 57%without any sign of airflow obst with DLCO 29/36% corrects to 63% for alv volume.  Systolic and Diastolic CHF with ICD placement: Ischemic cardiomyopathy with last ECHO 07/2015 with EF 30-35%, moderate LVH and grade 2 diastolic dysfunction. On lasix 40 mg daily and coreg 6.25 mg bid.   CAD s/p CABG: Stable. No chest pain or change in baseline shortness of breath. s/p CABG x 3 (VG->PDA, VG->PL, LIMA->LAD); Underwent left heart catheterization on 07/2015 which showed severe 3 vessel obstructive disease with patent grafts. Did have 50% stenosis of LIMA. He is currently on lasix 40 mg daily, Lipitor 40 mg daily, coreg 6.25 mg bid. Recently restarted on Lisinopril 5 mg daily at cardiology visit 8 days ago. He had previously been stopped due to worsening kidney function. Cr stable 0.94->1.08. -Continue home medications  PAD: Patient with proximal popliteal artery in 2014. ABIs in 12/2015 showed patent stent with borderline ABI on the right side at 0.93 and normal on the left. Symptoms improved.  Hypothyroidism: TSH 0.588 10/2015. On Levothyroxine 75 mcg daily. -Continue home dose levothyroxine   Gout: Takes allopurinol 300 mg daily. -Continue  home meds  HLD: Atorvastatin 40 mg daily -Continue home meds  DVT PPx: SCDs  CODE: FULL  Dispo: Disposition is deferred at this time, awaiting improvement of current medical problems. Anticipated discharge in approximately 1-2 day(s).   The patient does have a current PCP Karlene Einstein, MD) and does need an [facility] hospital follow-up appointment after discharge.  The patient does not have transportation limitations that hinder transportation to clinic appointments.  Signed: Maryellen Pile, MD 03/10/2016, 8:30 PM

## 2016-03-10 NOTE — ED Notes (Signed)
Pt is here to have hemoglobin drawn after having the same symptoms and states he is not sob than he normally is.

## 2016-03-10 NOTE — ED Notes (Signed)
Pt refused 2nd IV 

## 2016-03-10 NOTE — ED Notes (Signed)
Attempted to undress pt and place pt on monitor pt refused pt states all he wants is his hemoglobin checked and he wants to go home RN aware

## 2016-03-10 NOTE — ED Notes (Signed)
Admitting physicians at bedside at this time. Patient to be transported to inpatient room as soon as evaluation complete.

## 2016-03-10 NOTE — ED Provider Notes (Signed)
CSN: [id number]     Arrival date & time 03/10/16  1512 History   First MD Initiated Contact with Patient 03/10/16 1719     Chief Complaint  Patient presents with  . Abnormal Lab     (Consider location/radiation/quality/duration/timing/severity/associated sxs/prior Treatment) HPI  70 year old male with a history of CAD, CHF, and recent upper GI bleed presents with recurrent weakness that felt like when he was anemic earlier this month. Call his PCP who told him the common to the ER to get labs evaluated. Patient states he has been having black stools but this has been unchanged since he was started on iron after his recent admission. Patient denies abdominal pain. Patient states he's always short of breath and not worse than typical. No chest pain. Patient received a blood transfusion last time and this feels similar but not as severe.  Past Medical History  Diagnosis Date  . Hyperlipidemia   . Hypertension   . Coronary heart disease     a. s/p CABG x 3 (VG->PDA, VG->PL, LIMA->LAD);  b. 2013 Abnl Myoview;  c. 2013 Cath: 3/3 patent grafts, occluded LCX which correlated w/ ischemia on MV->not amenable to PCI->Med Rx.  . Obesity   . CHF (congestive heart failure) ([place])   . VT (ventricular tachycardia) ([place])   . SVT (supraventricular tachycardia) ([place])   . PAD (peripheral artery disease) ([place])     Angiography in February of 2014: Moderate left iliac artery stenosis, mild to moderate diffuse right SFA disease. Severe proximal right popliteal artery stenosis with three-vessel runoff below the knee. Status post self-expanding stent placement to the proximal popliteal artery  . Implantable cardioverter-defibrillator Mdt   . Asthma     "small touch" (07/29/2013)  . History of pneumonia     "3-4 times; last time ~ 2003" (07/29/2013)  . OSA on CPAP   . GERD (gastroesophageal reflux disease)   . H/O hiatal hernia   . Gout   . Anxiety   . Allergy   . Stomach cancer ([place]) 07/23/2015    invasive  adenocarcinoma ,esophagus ,distal   . Type II diabetes mellitus ([place])     on insulin  . Sleep apnea   . Hyperlipidemia   . PAD (peripheral artery disease) ([place])   . CHF (congestive heart failure) ([place])   . A-fib ([place])     a. 11/09/2015: successful DCCV  . CKD (chronic kidney disease), stage III   . S/P radiation therapy 08/12/15-09/24/15    Ge  junction 54 Gy  . Blood transfusion without reported diagnosis    Past Surgical History  Procedure Laterality Date  . Tonsillectomy    . Cardiac catheterization    . Wrist fracture surgery Right   . Cataract extraction w/ intraocular lens implant Left   . Cardiac defibrillator placement  1997; ~ 2003  . Coronary artery bypass graft  1997; 2007    "X3" (07/29/2013)  . Nerve, tendon and artery repair Left 09/20/2013    Procedure: IRRIGATION AND DEBRIDEMENT,Exploration and Repair of Ulnar/Digital  Artery Nerve of Left Index finger;  Surgeon: Roseanne Kaufman, MD;  Location: [facility];  Service: Orthopedics;  Laterality: Left;  . Abdominal aortagram N/A 01/30/2013    Procedure: ABDOMINAL Maxcine Ham;  Surgeon: Wellington Hampshire, MD;  Location: [facility] CATH LAB;  Service: Cardiovascular;  Laterality: N/A;  . Cardiac catheterization N/A 07/22/2015    Procedure: Left Heart Cath and Coronary Angiography;  Surgeon: Peter M Martinique, MD;  Location: [facility] CV LAB;  Service: Cardiovascular;  Laterality: N/A;  . Esophagogastroduodenoscopy N/A 07/23/2015    Procedure: ESOPHAGOGASTRODUODENOSCOPY (EGD);  Surgeon: Wonda Horner, MD;  Location: [facility] ENDOSCOPY;  Service: Endoscopy;  Laterality: N/A;  . Tee without cardioversion N/A 10/06/2015    Procedure: TRANSESOPHAGEAL ECHOCARDIOGRAM (TEE);  Surgeon: Pixie Casino, MD;  Location: [facility] ENDOSCOPY;  Service: Cardiovascular;  Laterality: N/A;  . Cardioversion N/A 11/09/2015    Procedure: CARDIOVERSION;  Surgeon: Pixie Casino, MD;  Location: [facility] ENDOSCOPY;  Service: Cardiovascular;  Laterality: N/A;  .  Esophagogastroduodenoscopy N/A 02/18/2016    Procedure: ESOPHAGOGASTRODUODENOSCOPY (EGD);  Surgeon: Ronald Lobo, MD;  Location: [facility] ENDOSCOPY;  Service: Endoscopy;  Laterality: N/A;   Family History  Problem Relation Age of Onset  . Heart disease Brother   . Cancer Brother   . Breast cancer Mother    Social History  Substance Use Topics  . Smoking status: Former Smoker -- 2.00 packs/day for 30 years    Types: Cigarettes    Quit date: 12/13/1995  . Smokeless tobacco: Never Used  . Alcohol Use: No     Comment: 07/29/2013 "not touched any alcohol since 1997"    Review of Systems  Constitutional: Positive for fatigue.  Respiratory: Negative for shortness of breath.   Cardiovascular: Negative for chest pain.  Gastrointestinal: Negative for nausea, vomiting and abdominal pain.  Neurological: Positive for weakness.  All other systems reviewed and are negative.     Allergies  Adhesive; Levaquin; and Morphine and related  Home Medications   Prior to Admission medications   Medication Sig Start Date End Date Taking? Authorizing Provider  allopurinol (ZYLOPRIM) 300 MG tablet Take 300 mg by mouth daily.      Historical Provider, MD  atorvastatin (LIPITOR) 40 MG tablet Take 1 tablet (40 mg total) by mouth daily. 03/11/15   Deboraha Sprang, MD  carvedilol (COREG) 6.25 MG tablet take 1 tablet by mouth twice a day with food 12/15/15   Peter M Martinique, MD  digoxin (LANOXIN) 0.125 MG tablet Take 1 tablet (0.125 mg total) by mouth daily. 10/20/15   Peter M Martinique, MD  ferrous sulfate 325 (65 FE) MG EC tablet Take 325 mg daily 03/03/16   Peter M Martinique, MD  furosemide (LASIX) 40 MG tablet Take 1 tablet (40 mg total) by mouth daily. 03/03/16   Peter M Martinique, MD  HYDROcodone-acetaminophen (NORCO/VICODIN) 5-325 MG tablet Take 1 tablet by mouth 4 (four) times daily as needed. 02/19/16   Historical Provider, MD  levothyroxine (SYNTHROID, LEVOTHROID) 75 MCG tablet Take 75 mcg by mouth daily before  breakfast.  12/06/14   Historical Provider, MD  lisinopril (PRINIVIL,ZESTRIL) 5 MG tablet Take 1 tablet (5 mg total) by mouth daily. 03/03/16   Peter M Martinique, MD  LORazepam (ATIVAN) 0.5 MG tablet Take 1 tablet (0.5 mg total) by mouth every 8 (eight) hours. Patient taking differently: Take 0.5 mg by mouth every 8 (eight) hours as needed.  11/16/15   Janece Canterbury, MD  nitroGLYCERIN (NITROSTAT) 0.4 MG SL tablet Place 1 tablet (0.4 mg total) under the tongue every 5 (five) minutes x 3 doses as needed for chest pain. 10/03/14   Peter M Martinique, MD  pantoprazole (PROTONIX) 40 MG tablet Take 1 tablet (40 mg total) by mouth daily. 02/19/16   Milagros Loll, MD  potassium chloride SA (K-DUR,KLOR-CON) 20 MEQ tablet Take 1 tablet (20 mEq total) by mouth daily. 10/10/15   Brett Canales, PA-C  sucralfate (CARAFATE) 1 g tablet Take 1  tablet (1 g total) by mouth 4 (four) times daily. 02/19/16   Milagros Loll, MD   BP 125/65 mmHg  Pulse 93  Temp(Src) 97.4 F (36.3 C) (Oral)  Resp 24  SpO2 99% Physical Exam  Constitutional: He is oriented to person, place, and time. He appears well-developed and well-nourished.  HENT:  Head: Normocephalic and atraumatic.  Right Ear: External ear normal.  Left Ear: External ear normal.  Nose: Nose normal.  Eyes: Right eye exhibits no discharge. Left eye exhibits no discharge.  Neck: Neck supple.  Cardiovascular: Normal rate, regular rhythm, normal heart sounds and intact distal pulses.   Pulmonary/Chest: Effort normal and breath sounds normal.  Abdominal: Soft. There is no tenderness.  Genitourinary: Rectal exam shows no external hemorrhoid, no internal hemorrhoid and no tenderness.  Black stool on rectal exam  Musculoskeletal: He exhibits no edema.  Neurological: He is alert and oriented to person, place, and time.  Skin: Skin is warm and dry. There is pallor.  Nursing note and vitals reviewed.   ED Course  Procedures (including critical care time) Labs  Review Labs Reviewed  COMPREHENSIVE METABOLIC PANEL - Abnormal; Notable for the following:    Glucose, Bld 104 (*)    Total Protein 6.0 (*)    Albumin 3.2 (*)    ALT 8 (*)    Alkaline Phosphatase 155 (*)    All other components within normal limits  CBC - Abnormal; Notable for the following:    RBC 2.61 (*)    Hemoglobin 7.5 (*)    HCT 24.1 (*)    RDW 17.0 (*)    Platelets 149 (*)    All other components within normal limits  POC OCCULT BLOOD, ED - Abnormal; Notable for the following:    Fecal Occult Bld POSITIVE (*)    All other components within normal limits  TYPE AND SCREEN    Imaging Review No results found. I have personally reviewed and evaluated these images and lab results as part of my medical decision-making.   EKG Interpretation   Date/Time:  Thursday March 10 2016 15:32:23 EDT Ventricular Rate:  85 PR Interval:  208 QRS Duration: 94 QT Interval:  394 QTC Calculation: 468 R Axis:   51 Text Interpretation:  Normal sinus rhythm Possible Anterior infarct , age  undetermined Abnormal ECG no significant change since Nov 2016 Confirmed  by Regenia Skeeter  MD, [place] ([id number]) on 03/10/2016 5:23:13 PM      MDM   Final diagnoses:  Symptomatic anemia    Patient with acute on chronic anemia. Given that his hemoglobin was 2 points higher one week ago I am concerned for a recurrent GI bleed. His stool is black and Hemoccult positive but this could be all because he is on iron. Discussed with the internal medicine teaching service, he will be admitted to their service with serial hemoglobins and close monitoring.    Sherwood Gambler, MD 03/11/16 [phone]

## 2016-03-11 ENCOUNTER — Observation Stay (HOSPITAL_COMMUNITY): Payer: PPO

## 2016-03-11 DIAGNOSIS — D619 Aplastic anemia, unspecified: Secondary | ICD-10-CM | POA: Insufficient documentation

## 2016-03-11 DIAGNOSIS — K208 Other esophagitis without bleeding: Secondary | ICD-10-CM | POA: Insufficient documentation

## 2016-03-11 DIAGNOSIS — Z85028 Personal history of other malignant neoplasm of stomach: Secondary | ICD-10-CM | POA: Diagnosis not present

## 2016-03-11 DIAGNOSIS — R918 Other nonspecific abnormal finding of lung field: Secondary | ICD-10-CM | POA: Diagnosis not present

## 2016-03-11 DIAGNOSIS — D649 Anemia, unspecified: Secondary | ICD-10-CM | POA: Insufficient documentation

## 2016-03-11 DIAGNOSIS — Z923 Personal history of irradiation: Secondary | ICD-10-CM | POA: Diagnosis not present

## 2016-03-11 DIAGNOSIS — Z8501 Personal history of malignant neoplasm of esophagus: Secondary | ICD-10-CM | POA: Diagnosis not present

## 2016-03-11 DIAGNOSIS — T451X5S Adverse effect of antineoplastic and immunosuppressive drugs, sequela: Secondary | ICD-10-CM | POA: Diagnosis not present

## 2016-03-11 DIAGNOSIS — D61810 Antineoplastic chemotherapy induced pancytopenia: Secondary | ICD-10-CM | POA: Diagnosis not present

## 2016-03-11 LAB — HCV COMMENT:

## 2016-03-11 LAB — HEPATITIS C ANTIBODY (REFLEX): HCV Ab: <0.1 s/co ratio (ref 0.0–0.9)

## 2016-03-11 LAB — CBC
HCT: 23.5 % — ABNORMAL LOW (ref 39.0–52.0)
Hemoglobin: 7.6 g/dL — ABNORMAL LOW (ref 13.0–17.0)
MCH: 29.3 pg (ref 26.0–34.0)
MCHC: 32.3 g/dL (ref 30.0–36.0)
MCV: 90.7 fL (ref 78.0–100.0)
Platelets: 110 10*3/uL — ABNORMAL LOW (ref 150–400)
RBC: 2.59 MIL/uL — ABNORMAL LOW (ref 4.22–5.81)
RDW: 17.2 % — ABNORMAL HIGH (ref 11.5–15.5)
WBC: 3.3 10*3/uL — AB (ref 4.0–10.5)

## 2016-03-11 LAB — HIV ANTIBODY (ROUTINE TESTING W REFLEX): HIV SCREEN 4TH GENERATION: NONREACTIVE

## 2016-03-11 LAB — HEMOGLOBIN AND HEMATOCRIT, BLOOD
HCT: 28.5 % — ABNORMAL LOW (ref 39.0–52.0)
Hemoglobin: 8.9 g/dL — ABNORMAL LOW (ref 13.0–17.0)

## 2016-03-11 LAB — GLUCOSE, CAPILLARY: Glucose-Capillary: 122 mg/dL — ABNORMAL HIGH (ref 65–99)

## 2016-03-11 LAB — PREPARE RBC (CROSSMATCH)

## 2016-03-11 MED ORDER — PANTOPRAZOLE SODIUM 40 MG PO TBEC
40.0000 mg | DELAYED_RELEASE_TABLET | Freq: Two times a day (BID) | ORAL | Status: DC
  Administered 2016-03-11: 40 mg via ORAL
  Filled 2016-03-11: qty 1

## 2016-03-11 MED ORDER — PANTOPRAZOLE SODIUM 40 MG PO TBEC: 40.0000 mg | DELAYED_RELEASE_TABLET | Freq: Two times a day (BID) | ORAL | Status: DC

## 2016-03-11 MED ORDER — SODIUM CHLORIDE 0.9 % IV SOLN
Freq: Once | INTRAVENOUS | Status: AC
  Administered 2016-03-11: 10:00:00 via INTRAVENOUS

## 2016-03-11 NOTE — Progress Notes (Signed)
Patient given discharge instructions and all questions answered.  Patient discharged via wheelchair with all belongings.   

## 2016-03-11 NOTE — Discharge Summary (Signed)
Name: Walter French MRN: [id number] DOB: 25-Feb-1946 70 y.o. PCP: Walter Einstein, MD  Date of Admission: 03/10/2016  5:12 PM Date of Discharge: 03/11/2016 Attending Physician: No att. providers found  Discharge Diagnosis: 1. Symptomatic anemia secondary to radiation esophagitis Principal Problem:   Radiation-induced esophagitis Active Problems:   Hyperlipidemia   Coronary artery disease-status post CABG   Automatic implantable cardioverter-defibrillator in situ   Systolic and diastolic CHF, chronic (HCC)   Type 2 diabetes mellitus with renal manifestations (HCC)   Hypothyroidism   Persistent atrial fibrillation (HCC)   Chronic anticoagulation - Eliquis, CHADS2VASC=5   Cardiomyopathy, ischemic-30-35%   Upper GI bleeding   Symptomatic anemia   Radiation esophagitis   Anemia due to bone marrow failure (Walter French)  Discharge Medications:   Medication List    TAKE these medications        allopurinol 300 MG tablet  Commonly known as:  ZYLOPRIM  Take 300 mg by mouth daily.     atorvastatin 40 MG tablet  Commonly known as:  LIPITOR  Take 1 tablet (40 mg total) by mouth daily.     carvedilol 6.25 MG tablet  Commonly known as:  COREG  take 1 tablet by mouth twice a day with food     digoxin 0.125 MG tablet  Commonly known as:  LANOXIN  Take 1 tablet (0.125 mg total) by mouth daily.     ferrous sulfate 325 (65 FE) MG EC tablet  Take 325 mg daily     furosemide 40 MG tablet  Commonly known as:  LASIX  Take 1 tablet (40 mg total) by mouth daily.     HYDROcodone-acetaminophen 5-325 MG tablet  Commonly known as:  NORCO/VICODIN  Take 1 tablet by mouth every 6 (six) hours as needed for moderate pain.     levothyroxine 75 MCG tablet  Commonly known as:  SYNTHROID, LEVOTHROID  Take 75 mcg by mouth daily before breakfast.     lisinopril 5 MG tablet  Commonly known as:  PRINIVIL,ZESTRIL  Take 1 tablet (5 mg total) by mouth daily.     LORazepam 0.5 MG tablet  Commonly  known as:  ATIVAN  Take 1 tablet (0.5 mg total) by mouth every 8 (eight) hours.     nitroGLYCERIN 0.4 MG SL tablet  Commonly known as:  NITROSTAT  Place 1 tablet (0.4 mg total) under the tongue every 5 (five) minutes x 3 doses as needed for chest pain.     pantoprazole 40 MG tablet  Commonly known as:  PROTONIX  Take 1 tablet (40 mg total) by mouth 2 (two) times daily.     potassium chloride SA 20 MEQ tablet  Commonly known as:  K-DUR,KLOR-CON  Take 1 tablet (20 mEq total) by mouth daily.     sucralfate 1 g tablet  Commonly known as:  CARAFATE  Take 1 tablet (1 g total) by mouth 4 (four) times daily.        Disposition and follow-up:   Walter French was discharged from [facility] in Stable condition.  At the hospital follow up visit please address:  1.  Symptomatic anemia, signs of bleeding, transfusions at cancer center as needed  2.  Labs / imaging needed at time of follow-up: CBC  3.  Pending labs/ test needing follow-up: none  Follow-up Appointments: Follow-up Information    Follow up with Walter Einstein, MD.   Specialty:  Family Medicine   Why:  As needed   Contact  information:   [address] [place] [address] [phone]       Follow up with Walter Martinique, MD.   Specialty:  Cardiology   Why:  As needed   Contact information:   [place] [address] [id number]       Discharge Instructions:   Consultations:    Procedures Performed:  Dg Chest 2 View  03/11/2016  CLINICAL DATA:  70 year old male with abnormal pulmonary auscultation at both lung bases. Initial encounter. Previous amiodarone use. Personal history of esophageal cancer. EXAM: CHEST  2 VIEW COMPARISON:  12/22/2015 chest radiographs, chest CT 11/14/2015, and earlier FINDINGS: Increased left lung base opacity since January, most resembles a new small pleural effusion superimposed on the previously demonstrated lower lobe lung process.  Possible trace right pleural effusion. Otherwise stable lung parenchyma. Stable cardiac size and mediastinal contours. Sequelae of CABG. Left chest cardiac AICD. No pneumothorax. No pulmonary edema. Stable visualized osseous structures. Calcified aortic atherosclerosis. IMPRESSION: Increased left greater than right lung base opacity suspected due to new small pleural effusions superimposed on the lower lobe process described by CT on 11/14/2015 (please see that report). No pulmonary edema or other new cardiopulmonary abnormality. Electronically Signed   By: Walter French M.D.   On: 03/11/2016 08:27    2D Echo:   Cardiac Cath:   Admission HPI: Walter French is a 70 year old male with a history of TXN2M0 esophageal adenocarcinoma status post radiation therapy and chemotherapy as he was not felt to be a surgical candidate secondary to coronary artery disease status post bypass graft, chronic systolic heart failure with EF 30%, atrial fibrillation, peripheral artery disease, hypertension, hyperlipidemia, OSA, type 2 diabetes resolved with 86 pound weight loss associated with his esophageal cancer presents today with complaints of weakness. Patient was admitted earlier this month with symptomatic anemia from an upper GI bleed. He underwent an EGD at that time and was found to have severe radiation esophagitis, non-bleeding gastric ulcer, focal mucosal hemorrhage of the stomach. His esophageal cancer was no longer evident. He was started on Protonix 40 mg daily and Sucralfate qid at that time and Eliquis was stopped. He reports that he did not sleep well last night and was feeling weak and tired today so he called his PCP. He was instructed to come to the ED to have his hemoglobin checked. He reports only mild generalized weakness. Denies any chest pain, palpations, lightheadedness, dizziness, change in shortness of breath from his baseline, abdominal pain, nausea, vomiting, hematemesis, hematochezia. Does note some dark  stools but reports being on iron supplement. No change in stool consistency.   Hospital Course by problem list: Principal Problem:   Radiation-induced esophagitis Active Problems:   Hyperlipidemia   Coronary artery disease-status post CABG   Automatic implantable cardioverter-defibrillator in situ   Systolic and diastolic CHF, chronic (HCC)   Type 2 diabetes mellitus with renal manifestations (HCC)   Hypothyroidism   Persistent atrial fibrillation (HCC)   Chronic anticoagulation - Eliquis, CHADS2VASC=5   Cardiomyopathy, ischemic-30-35%   Upper GI bleeding   Symptomatic anemia   Radiation esophagitis   Anemia due to bone marrow failure (HCC)   Anemia secondary to severe radiation esophagitis and gastritis: History of esophageal adenocarcinoma s/p radiation and chemotherapy (completed in October 2016). Recent EGD on 02/18/16 showed severe radiation esophagitis, non-bleeding gastric ulcer, focal mucosal hemorrhage of the stomach. His esophageal cancer was no longer evident. He was started on daily Protonox and Sucralfate. Eliquis  has been held since discharge on 02/22/16. He was feeling slightly weaker than normal yesterday and came to the ED to check his Hgb. He was noted to have a Hgb of 7.5, down from 9.4 on 03/02/16. He denied any obvious bleeding other than dark stools on iron supplement. He was transfused one unit PRBCs and resultant Hgb was 7.6. He was transfused another unit with post-transfusion Hgb of 8.9, similar to his prior Hgb this month. Home Protonix was increased to twice daily. He was discharged at his normal state of health. Patient advised to call cancer center for new symptoms/hemoglobin checks and transfusions  Discharge Vitals:   BP 104/59 mmHg  Pulse 79  Temp(Src) 98.5 F (36.9 C) (Oral)  Resp 18  Wt 197 lb 3.2 oz (89.449 kg)  SpO2 96%  Discharge Labs:  Results for orders placed or performed during the hospital encounter of 03/10/16 (from the past 24 hour(s))    Prepare RBC     Status: None   Collection Time: 03/10/16  6:22 PM  Result Value Ref Range   Order Confirmation ORDER PROCESSED BY BLOOD BANK   Iron and TIBC     Status: Abnormal   Collection Time: 03/10/16  6:35 PM  Result Value Ref Range   Iron 58 45 - 182 ug/dL   TIBC 370 250 - 450 ug/dL   Saturation Ratios 16 (L) 17.9 - 39.5 %   UIBC 312 ug/dL  Ferritin     Status: None   Collection Time: 03/10/16  6:35 PM  Result Value Ref Range   Ferritin 35 24 - 336 ng/mL  HIV antibody     Status: None   Collection Time: 03/10/16  6:35 PM  Result Value Ref Range   HIV Screen 4th Generation wRfx Non Reactive Non Reactive  Hepatitis c antibody (reflex)     Status: None   Collection Time: 03/10/16  6:35 PM  Result Value Ref Range   HCV Ab <0.1 0.0 - 0.9 s/co ratio  HCV Comment:     Status: None   Collection Time: 03/10/16  6:35 PM  Result Value Ref Range   Comment: Comment   Glucose, capillary     Status: None   Collection Time: 03/10/16  8:55 PM  Result Value Ref Range   Glucose-Capillary 86 65 - 99 mg/dL   Comment 1 Notify RN    Comment 2 Document in Chart   CBC     Status: Abnormal   Collection Time: 03/11/16  5:03 AM  Result Value Ref Range   WBC 3.3 (L) 4.0 - 10.5 K/uL   RBC 2.59 (L) 4.22 - 5.81 MIL/uL   Hemoglobin 7.6 (L) 13.0 - 17.0 g/dL   HCT 23.5 (L) 39.0 - 52.0 %   MCV 90.7 78.0 - 100.0 fL   MCH 29.3 26.0 - 34.0 pg   MCHC 32.3 30.0 - 36.0 g/dL   RDW 17.2 (H) 11.5 - 15.5 %   Platelets 110 (L) 150 - 400 K/uL  Prepare RBC     Status: None   Collection Time: 03/11/16  6:55 AM  Result Value Ref Range   Order Confirmation ORDER PROCESSED BY BLOOD BANK   Glucose, capillary     Status: Abnormal   Collection Time: 03/11/16  9:18 AM  Result Value Ref Range   Glucose-Capillary 122 (H) 65 - 99 mg/dL  Hemoglobin and hematocrit, blood     Status: Abnormal   Collection Time: 03/11/16  2:12 PM  Result Value Ref  Range   Hemoglobin 8.9 (L) 13.0 - 17.0 g/dL   HCT 28.5 (L) 39.0 -  52.0 %    Signed: Zada Finders, MD 03/11/2016, 5:56 PM    Services Ordered on Discharge: none Equipment Ordered on Discharge: none

## 2016-03-11 NOTE — Progress Notes (Signed)
Pt. refused CPAP, stated, "have one at home, don't need one here", notified to make aware if wanting one to use.

## 2016-03-11 NOTE — Progress Notes (Signed)
   Subjective: Patient feels well today. He says he feels "like a hundred bucks." He is eager to go home today.   Objective: Vital signs in last 24 hours: Filed Vitals:   03/11/16 0855 03/11/16 0915 03/11/16 0945 03/11/16 1228  BP: 100/60 107/56 105/54 104/59  Pulse: 81 90 77 79  Temp: 98.1 F (36.7 C) 98.4 F (36.9 C) 98.5 F (36.9 C) 98.5 F (36.9 C)  TempSrc: Oral Oral Oral Oral  Resp: 18 20 20 18   Weight:      SpO2: 92% 97% 97% 96%   Weight change:   Intake/Output Summary (Last 24 hours) at 03/11/16 1548 Last data filed at 03/11/16 1356  Gross per 24 hour  Intake 1467.5 ml  Output    200 ml  Net 1267.5 ml   General: resting in bed, pleasant, no acute distress Cardiac: RRR, no rubs, murmurs or gallops Pulm: bibasilar crackles, no wheezing Abd: soft, nontender, nondistended, BS present Ext: warm and well perfused, no pedal edema    Assessment/Plan: Principal Problem:   Radiation-induced esophagitis Active Problems:   Hyperlipidemia   Coronary artery disease-status post CABG   Automatic implantable cardioverter-defibrillator in situ   Systolic and diastolic CHF, chronic (HCC)   Type 2 diabetes mellitus with renal manifestations (HCC)   Hypothyroidism   Persistent atrial fibrillation (HCC)   Chronic anticoagulation - Eliquis, CHADS2VASC=5   Cardiomyopathy, ischemic-30-35%   Upper GI bleeding   Symptomatic anemia   Radiation esophagitis   Anemia due to bone marrow failure (HCC)  Anemia secondary to severe radiation esophagitis and gastritis: History of esophageal adenocarcinoma s/p radiation and chemotherapy (completed in October 2016). Recent EGD on 02/18/16 showed severe radiation esophagitis, non-bleeding gastric ulcer, focal mucosal hemorrhage of the stomach. His esophageal cancer was no longer evident. He was started on daily Protonox and Sucralfate. Eliquis has been held since discharge on 02/22/16. He was feeling slightly weaker than normal yesterday and came  to the ED to check his Hgb. He was noted to have a Hgb of 7.5, down from 9.4 on 03/02/16. He denied any obvious bleeding other than dark stools on iron supplement. He was transfused one unit PRBCs and resultant Hgb was 7.6. He was transfused another unit with post-transfusion Hgb of 8.9, similar to his prior Hgb this month. -Continue Protonix 40 mg BID and Sucralfate daily -Patient advised to call cancer center for new symptoms/hemoglobin checks and transfusions    Dispo:  Anticipated discharge today.     Zada Finders, MD 03/11/2016, 3:48 PM

## 2016-03-11 NOTE — Discharge Instructions (Signed)
If you have mild weakness or similar symptoms as you were having when you came to the Emergency Department, please call the [facility] to have your blood checked and for possible blood transfusion as needed.  If your symptoms are worse than you were having, please seek Emergency Care as soon as possible.  Continue your Sucralfate. Increase your Protonix to twice a day. Please monitor for any bleeding.

## 2016-03-11 NOTE — Care Management Obs Status (Signed)
[place] NOTIFICATION   Patient Details  Name: Walter French MRN: [id number] Date of Birth: 1946/10/16   Medicare Observation Status Notification Given:  Yes    Royston Bake, RN 03/11/2016, 11:29 AM

## 2016-03-12 LAB — TYPE AND SCREEN
ABO/RH(D): O POS
ANTIBODY SCREEN: NEGATIVE
UNIT DIVISION: 0
Unit division: 0

## 2016-03-14 ENCOUNTER — Other Ambulatory Visit: Payer: Self-pay | Admitting: *Deleted

## 2016-03-14 DIAGNOSIS — C160 Malignant neoplasm of cardia: Secondary | ICD-10-CM

## 2016-03-15 ENCOUNTER — Ambulatory Visit: Payer: PPO | Admitting: Cardiovascular Disease

## 2016-03-15 ENCOUNTER — Other Ambulatory Visit: Payer: Self-pay | Admitting: *Deleted

## 2016-03-15 ENCOUNTER — Encounter: Payer: Self-pay | Admitting: Cardiology

## 2016-03-15 LAB — CUP PACEART REMOTE DEVICE CHECK
Date Time Interrogation Session: 20170316225917
HIGH POWER IMPEDANCE MEASURED VALUE: 43 Ohm
HighPow Impedance: 380 Ohm
Implantable Lead Implant Date: 20031205
Implantable Lead Model: 6932
Lead Channel Pacing Threshold Amplitude: 1 V
Lead Channel Sensing Intrinsic Amplitude: 22.625 mV
MDC IDC LEAD LOCATION: 753860
MDC IDC MSMT BATTERY VOLTAGE: 3.01 V
MDC IDC MSMT LEADCHNL RV IMPEDANCE VALUE: 380 Ohm
MDC IDC MSMT LEADCHNL RV PACING THRESHOLD PULSEWIDTH: 0.4 ms
MDC IDC MSMT LEADCHNL RV SENSING INTR AMPL: 22.625 mV
MDC IDC SET LEADCHNL RV PACING AMPLITUDE: 2.5 V
MDC IDC SET LEADCHNL RV PACING PULSEWIDTH: 0.4 ms
MDC IDC SET LEADCHNL RV SENSING SENSITIVITY: 0.3 mV
MDC IDC STAT BRADY RV PERCENT PACED: 0.01 %

## 2016-03-16 ENCOUNTER — Other Ambulatory Visit: Payer: Self-pay | Admitting: *Deleted

## 2016-03-16 ENCOUNTER — Encounter: Payer: Self-pay | Admitting: Internal Medicine

## 2016-03-16 ENCOUNTER — Telehealth: Payer: Self-pay | Admitting: Oncology

## 2016-03-16 NOTE — Telephone Encounter (Signed)
per pof to sch pt appt-cld & spoke to pt and gave pt time * date of appt for lab @8 

## 2016-03-17 DIAGNOSIS — I10 Essential (primary) hypertension: Secondary | ICD-10-CM | POA: Diagnosis not present

## 2016-03-17 DIAGNOSIS — E118 Type 2 diabetes mellitus with unspecified complications: Secondary | ICD-10-CM | POA: Diagnosis not present

## 2016-03-17 DIAGNOSIS — E7800 Pure hypercholesterolemia, unspecified: Secondary | ICD-10-CM | POA: Diagnosis not present

## 2016-03-18 ENCOUNTER — Ambulatory Visit (HOSPITAL_COMMUNITY)
Admission: RE | Admit: 2016-03-18 | Discharge: 2016-03-18 | Disposition: A | Discharge status: Home / Self Care | Payer: PPO | Source: Ambulatory Visit | Attending: Family Medicine | Admitting: Family Medicine

## 2016-03-18 ENCOUNTER — Telehealth: Payer: Self-pay | Admitting: Nurse Practitioner

## 2016-03-18 ENCOUNTER — Other Ambulatory Visit (HOSPITAL_BASED_OUTPATIENT_CLINIC_OR_DEPARTMENT_OTHER): Payer: PPO

## 2016-03-18 ENCOUNTER — Other Ambulatory Visit: Payer: Self-pay | Admitting: *Deleted

## 2016-03-18 ENCOUNTER — Ambulatory Visit (HOSPITAL_BASED_OUTPATIENT_CLINIC_OR_DEPARTMENT_OTHER): Payer: PPO | Admitting: Nurse Practitioner

## 2016-03-18 ENCOUNTER — Ambulatory Visit (HOSPITAL_COMMUNITY)
Admission: RE | Admit: 2016-03-18 | Discharge: 2016-03-18 | Disposition: A | Discharge status: Home / Self Care | Payer: PPO | Source: Ambulatory Visit | Attending: Oncology | Admitting: Oncology

## 2016-03-18 ENCOUNTER — Telehealth: Payer: Self-pay | Admitting: *Deleted

## 2016-03-18 ENCOUNTER — Encounter: Payer: Self-pay | Admitting: Cardiology

## 2016-03-18 VITALS — BP 115/57 | HR 73 | Temp 97.9°F | Resp 18

## 2016-03-18 VITALS — BP 125/54 | HR 88 | Temp 97.6°F | Resp 18 | Ht 72.0 in | Wt 203.4 lb

## 2016-03-18 DIAGNOSIS — K208 Other esophagitis without bleeding: Secondary | ICD-10-CM

## 2016-03-18 DIAGNOSIS — D649 Anemia, unspecified: Secondary | ICD-10-CM | POA: Insufficient documentation

## 2016-03-18 DIAGNOSIS — K922 Gastrointestinal hemorrhage, unspecified: Secondary | ICD-10-CM

## 2016-03-18 DIAGNOSIS — I5042 Chronic combined systolic (congestive) and diastolic (congestive) heart failure: Secondary | ICD-10-CM | POA: Diagnosis not present

## 2016-03-18 DIAGNOSIS — C155 Malignant neoplasm of lower third of esophagus: Secondary | ICD-10-CM

## 2016-03-18 DIAGNOSIS — C160 Malignant neoplasm of cardia: Secondary | ICD-10-CM | POA: Diagnosis not present

## 2016-03-18 DIAGNOSIS — E119 Type 2 diabetes mellitus without complications: Secondary | ICD-10-CM

## 2016-03-18 DIAGNOSIS — D638 Anemia in other chronic diseases classified elsewhere: Secondary | ICD-10-CM | POA: Diagnosis not present

## 2016-03-18 DIAGNOSIS — K2960 Other gastritis without bleeding: Secondary | ICD-10-CM

## 2016-03-18 DIAGNOSIS — D696 Thrombocytopenia, unspecified: Secondary | ICD-10-CM | POA: Diagnosis not present

## 2016-03-18 DIAGNOSIS — T66XXXA Radiation sickness, unspecified, initial encounter: Secondary | ICD-10-CM

## 2016-03-18 LAB — CHCC SMEAR

## 2016-03-18 LAB — CBC WITH DIFFERENTIAL/PLATELET
BASO%: 0.4 % (ref 0.0–2.0)
BASOS ABS: 0 10*3/uL (ref 0.0–0.1)
EOS%: 3.7 % (ref 0.0–7.0)
Eosinophils Absolute: 0.1 10*3/uL (ref 0.0–0.5)
HEMATOCRIT: 24 % — AB (ref 38.4–49.9)
HGB: 7.6 g/dL — ABNORMAL LOW (ref 13.0–17.1)
LYMPH#: 0.5 10*3/uL — AB (ref 0.9–3.3)
LYMPH%: 12.6 % — ABNORMAL LOW (ref 14.0–49.0)
MCH: 29.2 pg (ref 27.2–33.4)
MCHC: 31.4 g/dL — AB (ref 32.0–36.0)
MCV: 93 fL (ref 79.3–98.0)
MONO#: 0.4 10*3/uL (ref 0.1–0.9)
MONO%: 9.9 % (ref 0.0–14.0)
NEUT#: 2.9 10*3/uL (ref 1.5–6.5)
NEUT%: 73.4 % (ref 39.0–75.0)
PLATELETS: 120 10*3/uL — AB (ref 140–400)
RBC: 2.59 10*6/uL — ABNORMAL LOW (ref 4.20–5.82)
RDW: 19 % — ABNORMAL HIGH (ref 11.0–14.6)
WBC: 4 10*3/uL (ref 4.0–10.3)

## 2016-03-18 LAB — RETICULOCYTES (CHCC)
IMMATURE RETIC FRACT: 11.1 % — AB (ref 3.00–10.60)
RBC: 2.57 10*6/uL — AB (ref 4.20–5.82)
RETIC %: 6.56 % — AB (ref 0.80–1.80)
RETIC CT ABS: 168.59 10*3/uL — AB (ref 34.80–93.90)

## 2016-03-18 LAB — PREPARE RBC (CROSSMATCH)

## 2016-03-18 MED ORDER — FUROSEMIDE 10 MG/ML IJ SOLN
20.0000 mg | Freq: Once | INTRAMUSCULAR | Status: AC
  Administered 2016-03-18: 20 mg via INTRAVENOUS
  Filled 2016-03-18: qty 2

## 2016-03-18 MED ORDER — SODIUM CHLORIDE 0.9 % IV SOLN
250.0000 mL | Freq: Once | INTRAVENOUS | Status: AC
  Administered 2016-03-18: 250 mL via INTRAVENOUS

## 2016-03-18 MED ORDER — SODIUM CHLORIDE 0.9% FLUSH: 10.0000 mL | INTRAVENOUS | Status: DC | PRN

## 2016-03-18 NOTE — Procedures (Signed)
[facility]  Procedure Note  Walter French [id number] DOB: Feb 20, 1946 DOA: 03/18/2016   PCP:Dr. Benay Spice  Associated Diagnosis: symptomatic anemia  Procedure Note: 2 units of PRBC's transfused per order.  Lasix in between units per order   Condition During Procedure: patient stable without any complaints   Condition at Discharge: patient ambulatory at discharge.   Roberto Scales, RN  [facility]

## 2016-03-18 NOTE — Discharge Instructions (Signed)

## 2016-03-18 NOTE — Telephone Encounter (Signed)
per pof to sch pt appt-gave pt copy of avs °

## 2016-03-18 NOTE — Telephone Encounter (Signed)
Spoke with pt in lobby. Dr. Benay Spice recommends blood transfusion today. He agrees, pt will be worked in at [facility] infusion center.

## 2016-03-18 NOTE — Progress Notes (Addendum)
Walter French   Diagnosis:  Gastroesophageal carcinoma  INTERVAL HISTORY:   Walter French returns prior to scheduled follow-up. He was hospitalized 02/17/2016 through 02/22/2016 with an upper GI bleed. Upper endoscopy on 02/18/2016 showed moderately severe radiation esophagitis; nonbleeding gastric ulcer with a clean ulcer base corresponding to previous tumor site. There was no evidence this was the source of bleeding. Erythematous mucosa in the stomach with focal mucosal hemorrhage. Erythematous duodenopathy. Source of bleeding felt to be hemorrhagic gastritis. He received red cell transfusion support.  He reports stools are black. He attributes this to oral iron. No bloody stools. No hematemesis. He is more short of breath than usual. No chest pain. He denies dysphagia. No abdominal or back pain. He is taking Protonix twice a day. He is no longer on a blood thinner.   Objective:  Vital signs in last 24 hours:  Blood pressure 125/54, pulse 88, temperature 97.6 F (36.4 C), temperature source Oral, resp. rate 18, height 6' (1.829 m), weight 203 lb 6.4 oz (92.262 kg), SpO2 100 %.    HEENT: No thrush or ulcers. Lymphatics: No palpable cervical, supraclavicular or axillary lymph nodes. Resp: Faint fine rales at both lung bases. Cardio: Regular rate and rhythm. GI: Abdomen soft and nontender. No hepatomegaly. Vascular: No leg edema.   Lab Results:  Lab Results  Component Value Date   WBC 4.0 03/18/2016   HGB 7.6* 03/18/2016   HCT 24.0* 03/18/2016   MCV 93.0 03/18/2016   PLT 120* 03/18/2016   NEUTROABS 2.9 03/18/2016   Peripheral blood smear reviewed by Dr. Lyndel Pleasure adequate; white blood cells unremarkable; red blood cells with a few ovalocytes, a few teardrops, polychromasia not increased  Imaging:  No results found.  Medications: I have reviewed the patient's current medications.  Assessment/Plan: 1. Adenocarcinoma the  gastroesophageal junction, status post an endoscopic biopsy of a distal esophagus mass and gastric cardia ulcer on 07/23/2015  Staging CT scans consistent with locally metastatic adenopathy  PET scan 08/07/2015 with hypermetabolic mass at the distal esophagus/GE junction and a hypermetabolic paraesophageal, gastrohepatic, and portacaval lymph nodes  Initiation of radiation 08/12/2015 with completion 09/24/2015; cycle 1 Taxol/carboplatin 08/13/2015; Final cycle given 09/22/2015.  2. History of coronary artery disease 3. Congestive heart failure 4. History of ventricular tachycardia and supraventricular tachycardia 5. Peripheral arterial disease 6. Implantable defibrillator 7. Diabetes 8. Gout 9. Hospitalization from 09/10/15 to 09/15/15 due to Acute Hypoxic Respiratory Failure and Sepsis as well as new onset of Atrial Fibrillation, 10. Hospitalization 09/30/2015 through 10/02/2015 with probable UTI/bronchitis. 11. Hospitalization 10/05/2015 with rapid atrial fibrillation 12. 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 Hospitalization with GI bleed 02/17/2016 through 02/22/2016 status post upper endoscopy 02/18/2016 with the source of bleeding felt to be hemorrhagic gastritis. There was no evidence of residual mass in the distal esophagus. He continues twice daily Protonix.   Disposition: Walter French remains in clinical remission from gastroesophageal carcinoma. He was hospitalized last month with a GI bleed. Upper endoscopy consistent with hemorrhagic gastritis. There was no evidence of residual mass in the distal esophagus.  He has progressive anemia. We are arranging for a blood transfusion today. We will add a reticulocyte count and smear to today's labs. He will increase oral iron to twice daily. He will return for a follow-up CBC in one week. We will see him in follow-up on 03/29/2016. He will contact the office in the interim with any  problems.  Patient  seen with Dr. Benay Spice. 25 minutes were spent face-to-face at today's visit with the majority of that time involving counseling/coordination of care.    Ned Card ANP/GNP-BC   03/18/2016  9:35 AM  This was a shared visit with Ned Card. I reviewed the peripheral blood smear. The anemia is most likely secondary to GI bleeding. He is no longer maintained on anticoagulation therapy. We will arrange for a red cell transfusion today. He will return for a CBC in one week and an office visit in 2 weeks. 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  Julieanne Manson, M.D.

## 2016-03-18 NOTE — Telephone Encounter (Signed)
per pof to sch pt for today-pt here and aware

## 2016-03-19 LAB — BASIC METABOLIC PANEL
BUN: 14 mg/dL (ref 7–25)
CHLORIDE: 103 mmol/L (ref 98–110)
CO2: 22 mmol/L (ref 20–31)
Calcium: 8.3 mg/dL — ABNORMAL LOW (ref 8.6–10.3)
Creat: 0.86 mg/dL (ref 0.70–1.25)
Glucose, Bld: 129 mg/dL — ABNORMAL HIGH (ref 65–99)
POTASSIUM: 3.5 mmol/L (ref 3.5–5.3)
SODIUM: 135 mmol/L (ref 135–146)

## 2016-03-21 LAB — TYPE AND SCREEN
ABO/RH(D): O POS
Antibody Screen: NEGATIVE
Unit division: 0
Unit division: 0

## 2016-03-25 ENCOUNTER — Ambulatory Visit (HOSPITAL_COMMUNITY)
Admission: RE | Admit: 2016-03-25 | Discharge: 2016-03-25 | Disposition: A | Discharge status: Home / Self Care | Payer: PPO | Source: Ambulatory Visit | Attending: Oncology | Admitting: Oncology

## 2016-03-25 ENCOUNTER — Other Ambulatory Visit: Payer: Self-pay | Admitting: *Deleted

## 2016-03-25 ENCOUNTER — Other Ambulatory Visit (HOSPITAL_BASED_OUTPATIENT_CLINIC_OR_DEPARTMENT_OTHER): Payer: PPO

## 2016-03-25 ENCOUNTER — Ambulatory Visit (HOSPITAL_BASED_OUTPATIENT_CLINIC_OR_DEPARTMENT_OTHER): Payer: PPO

## 2016-03-25 VITALS — BP 115/54 | HR 81 | Temp 96.9°F | Resp 17

## 2016-03-25 DIAGNOSIS — K2960 Other gastritis without bleeding: Secondary | ICD-10-CM

## 2016-03-25 DIAGNOSIS — C160 Malignant neoplasm of cardia: Secondary | ICD-10-CM

## 2016-03-25 DIAGNOSIS — D649 Anemia, unspecified: Secondary | ICD-10-CM | POA: Diagnosis not present

## 2016-03-25 DIAGNOSIS — K208 Other esophagitis without bleeding: Secondary | ICD-10-CM

## 2016-03-25 DIAGNOSIS — K922 Gastrointestinal hemorrhage, unspecified: Secondary | ICD-10-CM

## 2016-03-25 LAB — CBC WITH DIFFERENTIAL/PLATELET
BASO%: 0.8 % (ref 0.0–2.0)
BASOS ABS: 0 10*3/uL (ref 0.0–0.1)
EOS%: 3.5 % (ref 0.0–7.0)
Eosinophils Absolute: 0.1 10*3/uL (ref 0.0–0.5)
HEMATOCRIT: 23.6 % — AB (ref 38.4–49.9)
HEMOGLOBIN: 7.5 g/dL — AB (ref 13.0–17.1)
LYMPH#: 0.6 10*3/uL — AB (ref 0.9–3.3)
LYMPH%: 14.8 % (ref 14.0–49.0)
MCH: 28.5 pg (ref 27.2–33.4)
MCHC: 31.6 g/dL — AB (ref 32.0–36.0)
MCV: 90.4 fL (ref 79.3–98.0)
MONO#: 0.5 10*3/uL (ref 0.1–0.9)
MONO%: 12.5 % (ref 0.0–14.0)
NEUT#: 2.6 10*3/uL (ref 1.5–6.5)
NEUT%: 68.4 % (ref 39.0–75.0)
PLATELETS: 107 10*3/uL — AB (ref 140–400)
RBC: 2.62 10*6/uL — ABNORMAL LOW (ref 4.20–5.82)
RDW: 18.1 % — ABNORMAL HIGH (ref 11.0–14.6)
WBC: 3.9 10*3/uL — ABNORMAL LOW (ref 4.0–10.3)

## 2016-03-25 LAB — PREPARE RBC (CROSSMATCH)

## 2016-03-25 MED ORDER — SODIUM CHLORIDE 0.9% FLUSH
10.0000 mL | INTRAVENOUS | Status: DC | PRN
  Filled 2016-03-25: qty 10

## 2016-03-25 MED ORDER — SODIUM CHLORIDE 0.9 % IV SOLN
250.0000 mL | Freq: Once | INTRAVENOUS | Status: AC
  Administered 2016-03-25: 250 mL via INTRAVENOUS

## 2016-03-25 MED ORDER — FUROSEMIDE 10 MG/ML IJ SOLN
20.0000 mg | Freq: Once | INTRAMUSCULAR | Status: AC
  Administered 2016-03-25: 20 mg via INTRAVENOUS

## 2016-03-25 NOTE — Patient Instructions (Signed)

## 2016-03-25 NOTE — Progress Notes (Signed)
Pt here with HGB-7.5.  Pt states that he is weak, SOB and continues to have black stools.  Dr. Benay Spice notified and order received to give pt 2 units of PRBC's today.

## 2016-03-28 ENCOUNTER — Other Ambulatory Visit: Payer: Self-pay | Admitting: Gastroenterology

## 2016-03-28 DIAGNOSIS — R1013 Epigastric pain: Secondary | ICD-10-CM

## 2016-03-28 DIAGNOSIS — K921 Melena: Secondary | ICD-10-CM

## 2016-03-28 DIAGNOSIS — D500 Iron deficiency anemia secondary to blood loss (chronic): Secondary | ICD-10-CM | POA: Diagnosis not present

## 2016-03-28 LAB — TYPE AND SCREEN
ABO/RH(D): O POS
Antibody Screen: NEGATIVE
UNIT DIVISION: 0
UNIT DIVISION: 0

## 2016-03-29 ENCOUNTER — Other Ambulatory Visit: Payer: Self-pay | Admitting: Nurse Practitioner

## 2016-03-29 ENCOUNTER — Telehealth: Payer: Self-pay | Admitting: Oncology

## 2016-03-29 ENCOUNTER — Other Ambulatory Visit: Payer: PPO

## 2016-03-29 ENCOUNTER — Other Ambulatory Visit: Payer: Self-pay | Admitting: *Deleted

## 2016-03-29 ENCOUNTER — Other Ambulatory Visit (HOSPITAL_BASED_OUTPATIENT_CLINIC_OR_DEPARTMENT_OTHER): Payer: PPO

## 2016-03-29 ENCOUNTER — Telehealth: Payer: Self-pay

## 2016-03-29 ENCOUNTER — Encounter: Payer: Self-pay | Admitting: Cardiology

## 2016-03-29 ENCOUNTER — Ambulatory Visit (HOSPITAL_BASED_OUTPATIENT_CLINIC_OR_DEPARTMENT_OTHER): Payer: PPO | Admitting: Nurse Practitioner

## 2016-03-29 VITALS — BP 110/51 | HR 103 | Temp 97.3°F | Resp 18 | Ht 72.0 in | Wt 205.7 lb

## 2016-03-29 DIAGNOSIS — E119 Type 2 diabetes mellitus without complications: Secondary | ICD-10-CM | POA: Diagnosis not present

## 2016-03-29 DIAGNOSIS — C160 Malignant neoplasm of cardia: Secondary | ICD-10-CM

## 2016-03-29 DIAGNOSIS — D649 Anemia, unspecified: Secondary | ICD-10-CM

## 2016-03-29 DIAGNOSIS — C155 Malignant neoplasm of lower third of esophagus: Secondary | ICD-10-CM

## 2016-03-29 DIAGNOSIS — R195 Other fecal abnormalities: Secondary | ICD-10-CM

## 2016-03-29 LAB — DRAW EXTRA CLOT TUBE

## 2016-03-29 LAB — CBC WITH DIFFERENTIAL/PLATELET
BASO%: 0.8 % (ref 0.0–2.0)
Basophils Absolute: 0 10*3/uL (ref 0.0–0.1)
EOS%: 2.8 % (ref 0.0–7.0)
Eosinophils Absolute: 0.1 10*3/uL (ref 0.0–0.5)
HEMATOCRIT: 24.8 % — AB (ref 38.4–49.9)
HEMOGLOBIN: 7.9 g/dL — AB (ref 13.0–17.1)
LYMPH#: 0.7 10*3/uL — AB (ref 0.9–3.3)
LYMPH%: 13.8 % — ABNORMAL LOW (ref 14.0–49.0)
MCH: 29.1 pg (ref 27.2–33.4)
MCHC: 31.8 g/dL — AB (ref 32.0–36.0)
MCV: 91.4 fL (ref 79.3–98.0)
MONO#: 0.5 10*3/uL (ref 0.1–0.9)
MONO%: 11.2 % (ref 0.0–14.0)
NEUT%: 71.4 % (ref 39.0–75.0)
NEUTROS ABS: 3.4 10*3/uL (ref 1.5–6.5)
PLATELETS: 132 10*3/uL — AB (ref 140–400)
RBC: 2.71 10*6/uL — ABNORMAL LOW (ref 4.20–5.82)
RDW: 17.5 % — AB (ref 11.0–14.6)
WBC: 4.7 10*3/uL (ref 4.0–10.3)

## 2016-03-29 LAB — CHCC SMEAR

## 2016-03-29 LAB — PREPARE RBC (CROSSMATCH)

## 2016-03-29 MED ORDER — LORAZEPAM 0.5 MG PO TABS: 0.5000 mg | ORAL_TABLET | Freq: Three times a day (TID) | ORAL | Status: DC | PRN

## 2016-03-29 NOTE — Telephone Encounter (Signed)
Called and informed patient of scheduled appt for blood transfusion tomorrow 06/29/16 at 1230. Pt verbalized understanding

## 2016-03-29 NOTE — Progress Notes (Signed)
Called At[facility] into [facility] on [address]

## 2016-03-29 NOTE — Progress Notes (Addendum)
  [facility] OFFICE PROGRESS NOTE   Diagnosis:  Gastroesophageal carcinoma, anemia  INTERVAL HISTORY:   Walter French returns as scheduled. He was transfused 2 units of blood on 03/18/2016 and 03/25/2016. He notes mild improvement in dyspnea following a blood transfusion. He continues to have black stools. He wonders if the black stools may in part be related to oral iron. He has occasional stomach cramps. No nausea or vomiting. He confirms he is no longer taking anticoagulation.  Objective:  Vital signs in last 24 hours:  Blood pressure 110/51, pulse 103, temperature 97.3 F (36.3 C), temperature source Oral, resp. rate 18, height 6' (1.829 m), weight 205 lb 11.2 oz (93.305 kg), SpO2 98 %.    HEENT: No thrush or ulcers. Resp: Lungs clear bilaterally. Cardio: Regular with occasional premature beat. GI: Soft and nontender. No hepatomegaly. Vascular: Trace bilateral pretibial edema.   Lab Results:  Lab Results  Component Value Date   WBC 4.7 03/29/2016   HGB 7.9* 03/29/2016   HCT 24.8* 03/29/2016   MCV 91.4 03/29/2016   PLT 132* 03/29/2016   NEUTROABS 3.4 03/29/2016    Imaging:  No results found.  Medications: I have reviewed the patient's current medications.  Assessment/Plan: 1. Adenocarcinoma the gastroesophageal junction, status post an endoscopic biopsy of a distal esophagus mass and gastric cardia ulcer on 07/23/2015  Staging CT scans consistent with locally metastatic adenopathy  PET scan 08/07/2015 with hypermetabolic mass at the distal esophagus/GE junction and a hypermetabolic paraesophageal, gastrohepatic, and portacaval lymph nodes  Initiation of radiation 08/12/2015 with completion 09/24/2015; cycle 1 Taxol/carboplatin 08/13/2015; Final cycle given 09/22/2015.  2. History of coronary artery disease 3. Congestive heart failure 4. History of ventricular tachycardia and supraventricular tachycardia 5. Peripheral arterial  disease 6. Implantable defibrillator 7. Diabetes 8. Gout 9. Hospitalization from 09/10/15 to 09/15/15 due to Acute Hypoxic Respiratory Failure and Sepsis as well as new onset of Atrial Fibrillation, 10. Hospitalization 09/30/2015 through 10/02/2015 with probable UTI/bronchitis. 11. Hospitalization 10/05/2015 with rapid atrial fibrillation 12. 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 Hospitalization with GI bleed 02/17/2016 through 02/22/2016 status post upper endoscopy 02/18/2016 with the source of bleeding felt to be hemorrhagic gastritis. There was no evidence of residual mass in the distal esophagus. He continues twice daily Protonix. 14. Anemia most likely due to gastrointestinal bleeding. He is followed by GI.   Disposition: Walter French has persistent anemia. The anemia is most likely related to gastrointestinal bleeding. He is following up with Dr. Cristina Gong and Dr. Watt Climes. He is scheduled for a CT scan of the abdomen/pelvis later this week.  We will arrange for a blood transfusion in the next one to 2 days.  We will see him in follow-up and obtain a repeat CBC in one week.  Patient seen with Dr. Benay Spice.    Ned Card ANP/GNP-BC   03/29/2016  1:48 PM  This was a shared visit with Ned Card. Walter French continues to have severe anemia, likely related to ongoing GI blood loss. He saw Dr. Watt Climes yesterday and is scheduled for a CT evaluation later this week. We will arrange for another blood transfusion. Walter French will return for an office visit next week.  Julieanne Manson, M.D.

## 2016-03-29 NOTE — Telephone Encounter (Signed)
Gave and printed appt sched and avs for pt for April °

## 2016-03-30 ENCOUNTER — Ambulatory Visit (HOSPITAL_BASED_OUTPATIENT_CLINIC_OR_DEPARTMENT_OTHER): Payer: PPO

## 2016-03-30 VITALS — BP 119/61 | HR 83 | Temp 97.9°F | Resp 16

## 2016-03-30 DIAGNOSIS — C160 Malignant neoplasm of cardia: Secondary | ICD-10-CM

## 2016-03-30 DIAGNOSIS — D649 Anemia, unspecified: Secondary | ICD-10-CM

## 2016-03-30 MED ORDER — FUROSEMIDE 10 MG/ML IJ SOLN
20.0000 mg | Freq: Once | INTRAMUSCULAR | Status: AC
  Administered 2016-03-30: 20 mg via INTRAMUSCULAR

## 2016-03-30 MED ORDER — FUROSEMIDE 10 MG/ML IJ SOLN
INTRAMUSCULAR | Status: AC
  Filled 2016-03-30: qty 2

## 2016-03-30 MED ORDER — SODIUM CHLORIDE 0.9 % IV SOLN
250.0000 mL | Freq: Once | INTRAVENOUS | Status: AC
  Administered 2016-03-30: 250 mL via INTRAVENOUS

## 2016-03-30 NOTE — Progress Notes (Signed)
Pt completed first unit of blood without problems.  VSs taken post unit of blood.  BP  108/51 .  Dr. Burr Medico notified.  Order received to recheck BP again;  OK to give Lasix 20 mg IV if BP above [id number] Systolic.  Explanations given to pt.

## 2016-03-30 NOTE — Patient Instructions (Signed)

## 2016-03-31 ENCOUNTER — Telehealth: Payer: Self-pay | Admitting: *Deleted

## 2016-03-31 LAB — TYPE AND SCREEN
ABO/RH(D): O POS
Antibody Screen: NEGATIVE
Unit division: 0
Unit division: 0

## 2016-03-31 NOTE — Telephone Encounter (Signed)
Per desk RN I have canceled appt for tomorrow. I have attempted to call the patient regarding appt, no answer.

## 2016-03-31 NOTE — Telephone Encounter (Signed)
I have called and spoke with the patient. He knows not to come tomorow

## 2016-04-01 ENCOUNTER — Ambulatory Visit
Admission: RE | Admit: 2016-04-01 | Discharge: 2016-04-01 | Disposition: A | Discharge status: Home / Self Care | Payer: PPO | Source: Ambulatory Visit | Attending: Gastroenterology | Admitting: Gastroenterology

## 2016-04-01 DIAGNOSIS — K921 Melena: Secondary | ICD-10-CM

## 2016-04-01 DIAGNOSIS — R1013 Epigastric pain: Secondary | ICD-10-CM

## 2016-04-01 DIAGNOSIS — D500 Iron deficiency anemia secondary to blood loss (chronic): Secondary | ICD-10-CM

## 2016-04-01 MED ORDER — IOPAMIDOL (ISOVUE-300) INJECTION 61%
125.0000 mL | Freq: Once | INTRAVENOUS | Status: AC | PRN
  Administered 2016-04-01: 125 mL via INTRAVENOUS

## 2016-04-04 ENCOUNTER — Telehealth: Payer: Self-pay | Admitting: Oncology

## 2016-04-04 ENCOUNTER — Encounter (HOSPITAL_COMMUNITY): Payer: Self-pay | Admitting: *Deleted

## 2016-04-04 ENCOUNTER — Other Ambulatory Visit: Payer: Self-pay | Admitting: *Deleted

## 2016-04-04 ENCOUNTER — Other Ambulatory Visit: Payer: Self-pay | Admitting: Gastroenterology

## 2016-04-04 NOTE — Telephone Encounter (Signed)
pt called to resched appt on 4/25 & 4/26

## 2016-04-04 NOTE — Progress Notes (Signed)
Pt verbalized understanding of all pre-op instructions. Dr. Linna Caprice, Anesthesia, made aware of pt history; pt to be evaluated upon arrival.

## 2016-04-05 ENCOUNTER — Ambulatory Visit (HOSPITAL_COMMUNITY)
Admission: RE | Admit: 2016-04-05 | Discharge: 2016-04-05 | Disposition: A | Discharge status: Home / Self Care | Payer: PPO | Source: Ambulatory Visit | Attending: Gastroenterology | Admitting: Gastroenterology

## 2016-04-05 ENCOUNTER — Encounter (HOSPITAL_COMMUNITY): Payer: Self-pay | Admitting: *Deleted

## 2016-04-05 ENCOUNTER — Ambulatory Visit: Payer: PPO | Admitting: Nurse Practitioner

## 2016-04-05 ENCOUNTER — Other Ambulatory Visit: Payer: PPO

## 2016-04-05 ENCOUNTER — Encounter (HOSPITAL_COMMUNITY)
Admission: RE | Disposition: A | Discharge status: Home / Self Care | Payer: Self-pay | Source: Ambulatory Visit | Attending: Gastroenterology

## 2016-04-05 DIAGNOSIS — J45909 Unspecified asthma, uncomplicated: Secondary | ICD-10-CM | POA: Insufficient documentation

## 2016-04-05 DIAGNOSIS — D500 Iron deficiency anemia secondary to blood loss (chronic): Secondary | ICD-10-CM | POA: Diagnosis not present

## 2016-04-05 DIAGNOSIS — Z87891 Personal history of nicotine dependence: Secondary | ICD-10-CM | POA: Insufficient documentation

## 2016-04-05 DIAGNOSIS — Z95810 Presence of automatic (implantable) cardiac defibrillator: Secondary | ICD-10-CM | POA: Insufficient documentation

## 2016-04-05 DIAGNOSIS — K921 Melena: Secondary | ICD-10-CM | POA: Insufficient documentation

## 2016-04-05 DIAGNOSIS — Z79899 Other long term (current) drug therapy: Secondary | ICD-10-CM | POA: Insufficient documentation

## 2016-04-05 DIAGNOSIS — K2210 Ulcer of esophagus without bleeding: Secondary | ICD-10-CM | POA: Diagnosis not present

## 2016-04-05 DIAGNOSIS — K259 Gastric ulcer, unspecified as acute or chronic, without hemorrhage or perforation: Secondary | ICD-10-CM | POA: Insufficient documentation

## 2016-04-05 DIAGNOSIS — G4730 Sleep apnea, unspecified: Secondary | ICD-10-CM | POA: Insufficient documentation

## 2016-04-05 DIAGNOSIS — K520 Gastroenteritis and colitis due to radiation: Secondary | ICD-10-CM | POA: Diagnosis not present

## 2016-04-05 DIAGNOSIS — I10 Essential (primary) hypertension: Secondary | ICD-10-CM | POA: Diagnosis not present

## 2016-04-05 DIAGNOSIS — Z923 Personal history of irradiation: Secondary | ICD-10-CM | POA: Insufficient documentation

## 2016-04-05 DIAGNOSIS — K319 Disease of stomach and duodenum, unspecified: Secondary | ICD-10-CM | POA: Insufficient documentation

## 2016-04-05 HISTORY — PX: HOT HEMOSTASIS: SHX5433

## 2016-04-05 HISTORY — PX: ESOPHAGOGASTRODUODENOSCOPY (EGD) WITH PROPOFOL: SHX5813

## 2016-04-05 LAB — CBC
HCT: 25.8 % — ABNORMAL LOW (ref 39.0–52.0)
Hemoglobin: 8.1 g/dL — ABNORMAL LOW (ref 13.0–17.0)
MCH: 28.9 pg (ref 26.0–34.0)
MCHC: 31.4 g/dL (ref 30.0–36.0)
MCV: 92.1 fL (ref 78.0–100.0)
Platelets: 105 10*3/uL — ABNORMAL LOW (ref 150–400)
RBC: 2.8 MIL/uL — AB (ref 4.22–5.81)
RDW: 16.2 % — ABNORMAL HIGH (ref 11.5–15.5)
WBC: 3.6 10*3/uL — ABNORMAL LOW (ref 4.0–10.5)

## 2016-04-05 SURGERY — ESOPHAGOGASTRODUODENOSCOPY (EGD) WITH PROPOFOL
Anesthesia: Moderate Sedation

## 2016-04-05 MED ORDER — BUTAMBEN-TETRACAINE-BENZOCAINE 2-2-14 % EX AERO
INHALATION_SPRAY | CUTANEOUS | Status: DC | PRN
  Administered 2016-04-05: 2 via TOPICAL

## 2016-04-05 MED ORDER — DIPHENHYDRAMINE HCL 50 MG/ML IJ SOLN
INTRAMUSCULAR | Status: AC
  Filled 2016-04-05: qty 1

## 2016-04-05 MED ORDER — MIDAZOLAM HCL 5 MG/ML IJ SOLN
INTRAMUSCULAR | Status: AC
  Filled 2016-04-05: qty 2

## 2016-04-05 MED ORDER — SODIUM CHLORIDE 0.9 % IV SOLN
INTRAVENOUS | Status: DC
  Administered 2016-04-05: 13:00:00 via INTRAVENOUS

## 2016-04-05 MED ORDER — FENTANYL CITRATE (PF) 100 MCG/2ML IJ SOLN
INTRAMUSCULAR | Status: AC
  Filled 2016-04-05: qty 4

## 2016-04-05 MED ORDER — FENTANYL CITRATE (PF) 100 MCG/2ML IJ SOLN
INTRAMUSCULAR | Status: DC | PRN
  Administered 2016-04-05 (×4): 25 ug via INTRAVENOUS

## 2016-04-05 MED ORDER — MIDAZOLAM HCL 5 MG/5ML IJ SOLN
INTRAMUSCULAR | Status: DC | PRN
  Administered 2016-04-05 (×4): 2 mg via INTRAVENOUS

## 2016-04-05 NOTE — Progress Notes (Signed)
Walter French 1:18 PM  Subjective: Patient without any specific complaints and no new problems since we saw him in the office  Objective: Vital signs stable afebrile exam please see preassessment evaluation hemoglobin 8.1  Assessment: Subacute GI bleeding probably due to radiation damage  Plan: Okay to proceed with repeat endoscopy and possible APC  [facility]  Pager [phone] After 5PM or if no answer call [phone]

## 2016-04-05 NOTE — Discharge Instructions (Signed)
Call if question or problem otherwise follow-up as needed or as per Dr. Wendi Maya, Care After Refer to this sheet in the next few weeks. These instructions provide you with information about caring for yourself after your procedure. Your health care provider may also give you more specific instructions. Your treatment has been planned according to current medical practices, but problems sometimes occur. Call your health care provider if you have any problems or questions after your procedure. WHAT TO EXPECT AFTER THE PROCEDURE After your procedure, it is typical to feel:  Soreness in your throat.  Pain with swallowing.  Sick to your stomach (nauseous).  Bloated.  Dizzy.  Fatigued. HOME CARE INSTRUCTIONS  Do not eat or drink anything until the numbing medicine (local anesthetic) has worn off and your gag reflex has returned. You will know that the local anesthetic has worn off when you can swallow comfortably.  Do not drive or operate machinery until directed by your health care provider.  Take medicines only as directed by your health care provider. SEEK MEDICAL CARE IF:   You cannot stop coughing.  You are not urinating at all or less than usual. SEEK IMMEDIATE MEDICAL CARE IF:  You have difficulty swallowing.  You cannot eat or drink.  You have worsening throat or chest pain.  You have dizziness or lightheadedness or you faint.  You have nausea or vomiting.  You have chills.  You have a fever.  You have severe abdominal pain.  You have black, tarry, or bloody stools.   This information is not intended to replace advice given to you by your health care provider. Make sure you discuss any questions you have with your health care provider.   Document Released: 11/14/2012 Document Revised: 12/19/2014 Document Reviewed: 11/14/2012 Elsevier Interactive Patient Education [employer].

## 2016-04-05 NOTE — Op Note (Signed)
[facility] Patient Name: Walter French Procedure Date : 04/05/2016 MRN: [id number] Attending MD: Clarene Essex , MD Date of Birth: Nov 05, 1946 CSN: [id number] Age: 70 Admit Type: Outpatient Procedure:                Upper GI endoscopy Indications:              Iron deficiency anemia secondary to chronic blood                            loss Providers:                Clarene Essex, MD, Dortha Schwalbe, RN, Elspeth Cho, Technician Referring MD:              Medicines:                Fentanyl 100 micrograms IV, Midazolam 8 mg IV 2                            Hurricaine sprays Complications:            No immediate complications. Estimated Blood Loss:     Estimated blood loss: none. Procedure:                Pre-Anesthesia Assessment:                           - Prior to the procedure, a History and Physical                            was performed, and patient medications and                            allergies were reviewed. [name] tolerance of                            previous anesthesia was also reviewed. The risks                            and benefits of the procedure and the sedation                            options and risks were discussed with the patient.                            All questions were answered, and informed consent                            was obtained. Prior Anticoagulants: The patient has                            taken no previous anticoagulant or antiplatelet                            agents. ASA  Grade Assessment: III - A patient with                            severe systemic disease. After reviewing the risks                            and benefits, the patient was deemed in                            satisfactory condition to undergo the procedure.                           After obtaining informed consent, the endoscope was                            passed under direct vision. Throughout the                        procedure, the patient's blood pressure, pulse, and                            oxygen saturations were monitored continuously. The                            [id number]) scope was introduced through the                            mouth, and advanced to the second part of duodenum.                            The upper GI endoscopy was accomplished without                            difficulty. The patient tolerated the procedure                            well. Scope In: Scope Out: Findings:      Two cratered esophageal and cardia ulcers with no bleeding and no       stigmata of recent bleeding were found.      Localized hemorrhagic mucosa with bleeding and stigmata of recent       bleeding was found in the cardia, in the gastric fundus, on the greater       curvature of the stomach and on the lesser curvature of the stomach.       Fulguration to ablate the lesion by argon beam at 1 liter/minute and 20       watts was successful. this was diffuse in the proximal stomach      The duodenal bulb, first portion of the duodenum and second portion of       the duodenum were normal.      The exam was otherwise without abnormality. Impression:               - Non-bleeding esophageal in proximal gastric  ulcers.                           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                           - Normal duodenal bulb, first portion of the                            duodenum and second portion of the duodenum.                           - The examination was otherwise normal.                           - No specimens collected. Moderate Sedation:      Moderate (conscious) sedation was administered by the endoscopy nurse       and supervised by the endoscopist. The following parameters were       monitored: oxygen saturation, heart rate, blood pressure, respiratory       rate, EKG, adequacy of pulmonary ventilation, and response to  care. Recommendation:           - Patient has a contact number available for                            emergencies. The signs and symptoms of potential                            delayed complications were discussed with the                            patient. Return to normal activities tomorrow.                            Written discharge instructions were provided to the                            patient.                           - Soft diet today.                           - Continue present medications.                           - Return to GI clinic PRN.                           - Telephone GI clinic if symptomatic PRN.                           - Repeat upper endoscopy PRN to evaluate the                            response to therapy or retreat if we thought  it was                            helpful. Procedure Code(s):        --- Professional ---                           [id number], Esophagogastroduodenoscopy, flexible,                            transoral; with control of bleeding, any method Diagnosis Code(s):        --- Professional ---                           K22.10, Ulcer of esophagus without bleeding                           K92.2, Gastrointestinal hemorrhage, unspecified                           D50.0, Iron deficiency anemia secondary to blood                            loss (chronic) CPT copyright 2016 American Medical Association. All rights reserved. The codes documented in this report are preliminary and upon coder review may  be revised to meet current compliance requirements. Clarene Essex, MD Clarene Essex, MD 04/05/2016 3:10:39 PM This report has been signed electronically. Number of Addenda: 0

## 2016-04-06 ENCOUNTER — Telehealth: Payer: Self-pay | Admitting: Oncology

## 2016-04-06 ENCOUNTER — Telehealth: Payer: Self-pay | Admitting: *Deleted

## 2016-04-06 ENCOUNTER — Other Ambulatory Visit (HOSPITAL_BASED_OUTPATIENT_CLINIC_OR_DEPARTMENT_OTHER): Payer: PPO

## 2016-04-06 ENCOUNTER — Encounter (HOSPITAL_COMMUNITY): Payer: Self-pay | Admitting: Gastroenterology

## 2016-04-06 ENCOUNTER — Telehealth: Payer: Self-pay | Admitting: Nurse Practitioner

## 2016-04-06 ENCOUNTER — Ambulatory Visit (HOSPITAL_BASED_OUTPATIENT_CLINIC_OR_DEPARTMENT_OTHER): Payer: PPO | Admitting: Nurse Practitioner

## 2016-04-06 VITALS — BP 128/57 | HR 100 | Temp 98.4°F | Resp 21 | Ht 72.0 in | Wt 206.9 lb

## 2016-04-06 DIAGNOSIS — R195 Other fecal abnormalities: Secondary | ICD-10-CM

## 2016-04-06 DIAGNOSIS — D649 Anemia, unspecified: Secondary | ICD-10-CM

## 2016-04-06 DIAGNOSIS — C155 Malignant neoplasm of lower third of esophagus: Secondary | ICD-10-CM

## 2016-04-06 DIAGNOSIS — K769 Liver disease, unspecified: Secondary | ICD-10-CM | POA: Diagnosis not present

## 2016-04-06 DIAGNOSIS — C160 Malignant neoplasm of cardia: Secondary | ICD-10-CM

## 2016-04-06 DIAGNOSIS — E119 Type 2 diabetes mellitus without complications: Secondary | ICD-10-CM

## 2016-04-06 DIAGNOSIS — R590 Localized enlarged lymph nodes: Secondary | ICD-10-CM

## 2016-04-06 DIAGNOSIS — D500 Iron deficiency anemia secondary to blood loss (chronic): Secondary | ICD-10-CM

## 2016-04-06 LAB — CBC & DIFF AND RETIC
BASO%: 0.4 % (ref 0.0–2.0)
Basophils Absolute: 0 10*3/uL (ref 0.0–0.1)
EOS%: 2.6 % (ref 0.0–7.0)
Eosinophils Absolute: 0.1 10*3/uL (ref 0.0–0.5)
HCT: 25.3 % — ABNORMAL LOW (ref 38.4–49.9)
HGB: 8.1 g/dL — ABNORMAL LOW (ref 13.0–17.1)
Immature Retic Fract: 8.9 % (ref 3.00–10.60)
LYMPH%: 14.5 % (ref 14.0–49.0)
MCH: 29.5 pg (ref 27.2–33.4)
MCHC: 32 g/dL (ref 32.0–36.0)
MCV: 92 fL (ref 79.3–98.0)
MONO#: 0.6 10*3/uL (ref 0.1–0.9)
MONO%: 11.8 % (ref 0.0–14.0)
NEUT#: 3.5 10*3/uL (ref 1.5–6.5)
NEUT%: 70.7 % (ref 39.0–75.0)
Platelets: 119 10*3/uL — ABNORMAL LOW (ref 140–400)
RBC: 2.75 10*6/uL — ABNORMAL LOW (ref 4.20–5.82)
RDW: 16.4 % — ABNORMAL HIGH (ref 11.0–14.6)
Retic %: 5.67 % — ABNORMAL HIGH (ref 0.80–1.80)
Retic Ct Abs: 155.93 10*3/uL — ABNORMAL HIGH (ref 34.80–93.90)
WBC: 4.9 10*3/uL (ref 4.0–10.3)
lymph#: 0.7 10*3/uL — ABNORMAL LOW (ref 0.9–3.3)

## 2016-04-06 NOTE — Telephone Encounter (Signed)
per pof to sch pt appt-cld & spoke to pt and gave pt time & date of appt on 4/28 @11 :45

## 2016-04-06 NOTE — Progress Notes (Signed)
[place] OFFICE PROGRESS NOTE   Diagnosis:  Gastroesophageal carcinoma, anemia  INTERVAL HISTORY:   Walter French returns as scheduled. He felt better following the most recent blood transfusion. He continues to have black stools. No nausea or vomiting. He has occasional abdominal "cramps". Dyspnea is at baseline.  Objective:  Vital signs in last 24 hours:  Blood pressure 128/57, pulse 100, temperature 98.4 F (36.9 C), temperature source Oral, resp. rate 21, height 6' (1.829 m), weight 206 lb 14.4 oz (93.849 kg), SpO2 100 %.    HEENT: No thrush or ulcers. Resp: Distant breath sounds. No respiratory distress. Cardio: Regular rate and rhythm. GI: Abdomen soft and nontender. No hepatomegaly. No mass. Vascular: No leg edema.  Lab Results:  Lab Results  Component Value Date   WBC 4.9 04/06/2016   HGB 8.1* 04/06/2016   HCT 25.3* 04/06/2016   MCV 92.0 04/06/2016   PLT 119* 04/06/2016   NEUTROABS 3.5 04/06/2016    Imaging:  No results found.  Medications: I have reviewed the patient's current medications.  Assessment/Plan: 1. Adenocarcinoma the gastroesophageal junction, status post an endoscopic biopsy of a distal esophagus mass and gastric cardia ulcer on 07/23/2015  Staging CT scans consistent with locally metastatic adenopathy  PET scan 08/07/2015 with hypermetabolic mass at the distal esophagus/GE junction and a hypermetabolic paraesophageal, gastrohepatic, and portacaval lymph nodes  Initiation of radiation 08/12/2015 with completion 09/24/2015; cycle 1 Taxol/carboplatin 08/13/2015; Final cycle given 09/22/2015.  2. History of coronary artery disease 3. Congestive heart failure 4. History of ventricular tachycardia and supraventricular tachycardia 5. Peripheral arterial disease 6. Implantable defibrillator 7. Diabetes 8. Gout 9. Hospitalization from 09/10/15 to 09/15/15 due to Acute Hypoxic Respiratory Failure and Sepsis as well as new onset of  Atrial Fibrillation, 10. Hospitalization 09/30/2015 through 10/02/2015 with probable UTI/bronchitis. 11. Hospitalization 10/05/2015 with rapid atrial fibrillation 12. 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 Hospitalization with GI bleed 02/17/2016 through 02/22/2016 status post upper endoscopy 02/18/2016 with the source of bleeding felt to be hemorrhagic gastritis. There was no evidence of residual mass in the distal esophagus. He continues twice daily Protonix. 14. Anemia most likely due to gastrointestinal bleeding. He is followed by GI.   Upper endoscopy 04/05/2016 showed nonbleeding esophageal and proximal gastric ulcers. Localized hemorrhagic mucosa with bleeding and stigmata of recent bleeding found in the cardia, gastric fundus, greater curvature of the stomach and lesser curvature of the stomach. Fulguration was performed. 15. CT abdomen/pelvis 04/01/2016 with improved appearance of the esophageal tumor; small paraesophageal lymph nodes decreased in size; marked abnormal wall thickening of the antropyloric region of the stomach; new bilobed nodal mass measuring 25 x 12 mm in the retroperitoneum  between the aorta and the vena cava; small peripancreatic, celiac axis and hepatic duodenum lymph nodes slightly smaller or stable; new right hepatic lobe lesion.  Disposition: Walter French appears unchanged. Hemoglobin is stable as compared to yesterday. The upper endoscopy showed hemorrhagic gastropathy related to radiation. Fulguration was performed. He will return for a follow-up CBC on 04/08/2016, transfusion support as needed.  The recent CT scan of the abdomen/pelvis showed a new liver lesion and new lymph node mass in the retroperitoneum. We will review the CT scan with a radiologist. Walter French understands these findings could indicate recurrent cancer.  He will return for a follow-up visit on 04/25/2016. We will adjust that appointment  accordingly pending review of the CT scan. He will contact the office in the  interim with any problems.  I discussed the above with Dr. Benay French by telephone.  Walter French requested I review the above with his friend Walter French. I did so and answered her questions.  Ned Card ANP/GNP-BC   04/06/2016  2:14 PM

## 2016-04-06 NOTE — Telephone Encounter (Signed)
per pof to sch pt appt-sent MW emailto sch blood trans-will call pt after reply °

## 2016-04-06 NOTE — Telephone Encounter (Signed)
Per staff message and POF I have scheduled appts. Advised scheduler of appts and to move lab appt. JMW  

## 2016-04-08 ENCOUNTER — Other Ambulatory Visit: Payer: Self-pay

## 2016-04-08 ENCOUNTER — Telehealth: Payer: Self-pay | Admitting: Oncology

## 2016-04-08 ENCOUNTER — Other Ambulatory Visit: Payer: PPO

## 2016-04-08 ENCOUNTER — Other Ambulatory Visit (HOSPITAL_BASED_OUTPATIENT_CLINIC_OR_DEPARTMENT_OTHER): Payer: PPO

## 2016-04-08 DIAGNOSIS — C160 Malignant neoplasm of cardia: Secondary | ICD-10-CM

## 2016-04-08 DIAGNOSIS — D500 Iron deficiency anemia secondary to blood loss (chronic): Secondary | ICD-10-CM

## 2016-04-08 LAB — CBC WITH DIFFERENTIAL/PLATELET
BASO%: 0.7 % (ref 0.0–2.0)
BASOS ABS: 0 10*3/uL (ref 0.0–0.1)
EOS ABS: 0.1 10*3/uL (ref 0.0–0.5)
EOS%: 2.7 % (ref 0.0–7.0)
HCT: 25.3 % — ABNORMAL LOW (ref 38.4–49.9)
HEMOGLOBIN: 8 g/dL — AB (ref 13.0–17.1)
LYMPH%: 12.1 % — AB (ref 14.0–49.0)
MCH: 29.3 pg (ref 27.2–33.4)
MCHC: 31.8 g/dL — AB (ref 32.0–36.0)
MCV: 92.3 fL (ref 79.3–98.0)
MONO#: 0.4 10*3/uL (ref 0.1–0.9)
MONO%: 10.3 % (ref 0.0–14.0)
NEUT#: 2.8 10*3/uL (ref 1.5–6.5)
NEUT%: 74.2 % (ref 39.0–75.0)
Platelets: 126 10*3/uL — ABNORMAL LOW (ref 140–400)
RBC: 2.74 10*6/uL — ABNORMAL LOW (ref 4.20–5.82)
RDW: 16.4 % — AB (ref 11.0–14.6)
WBC: 3.8 10*3/uL — ABNORMAL LOW (ref 4.0–10.3)
lymph#: 0.5 10*3/uL — ABNORMAL LOW (ref 0.9–3.3)

## 2016-04-08 NOTE — Telephone Encounter (Signed)
per pof to sch pt appt-gave pt copy of avs °

## 2016-04-11 ENCOUNTER — Other Ambulatory Visit: Payer: Self-pay

## 2016-04-11 ENCOUNTER — Other Ambulatory Visit: Payer: Self-pay | Admitting: *Deleted

## 2016-04-11 ENCOUNTER — Ambulatory Visit (HOSPITAL_COMMUNITY)
Admission: RE | Admit: 2016-04-11 | Discharge: 2016-04-11 | Disposition: A | Discharge status: Home / Self Care | Payer: PPO | Source: Ambulatory Visit | Attending: Family Medicine | Admitting: Family Medicine

## 2016-04-11 ENCOUNTER — Ambulatory Visit (HOSPITAL_COMMUNITY)
Admission: RE | Admit: 2016-04-11 | Discharge: 2016-04-11 | Disposition: A | Discharge status: Home / Self Care | Payer: PPO | Source: Ambulatory Visit | Attending: Oncology | Admitting: Oncology

## 2016-04-11 ENCOUNTER — Other Ambulatory Visit (HOSPITAL_BASED_OUTPATIENT_CLINIC_OR_DEPARTMENT_OTHER): Payer: PPO

## 2016-04-11 VITALS — BP 121/58 | HR 85 | Temp 98.1°F | Resp 24

## 2016-04-11 DIAGNOSIS — D500 Iron deficiency anemia secondary to blood loss (chronic): Secondary | ICD-10-CM | POA: Diagnosis not present

## 2016-04-11 DIAGNOSIS — D649 Anemia, unspecified: Secondary | ICD-10-CM

## 2016-04-11 DIAGNOSIS — C160 Malignant neoplasm of cardia: Secondary | ICD-10-CM | POA: Diagnosis not present

## 2016-04-11 LAB — CBC WITH DIFFERENTIAL/PLATELET
BASO%: 0.5 % (ref 0.0–2.0)
Basophils Absolute: 0 10*3/uL (ref 0.0–0.1)
EOS%: 3.2 % (ref 0.0–7.0)
Eosinophils Absolute: 0.1 10*3/uL (ref 0.0–0.5)
HCT: 21.1 % — ABNORMAL LOW (ref 38.4–49.9)
HEMOGLOBIN: 6.6 g/dL — AB (ref 13.0–17.1)
LYMPH#: 0.6 10*3/uL — AB (ref 0.9–3.3)
LYMPH%: 13.8 % — ABNORMAL LOW (ref 14.0–49.0)
MCH: 28.7 pg (ref 27.2–33.4)
MCHC: 31.3 g/dL — ABNORMAL LOW (ref 32.0–36.0)
MCV: 91.7 fL (ref 79.3–98.0)
MONO#: 0.5 10*3/uL (ref 0.1–0.9)
MONO%: 12.2 % (ref 0.0–14.0)
NEUT%: 70.3 % (ref 39.0–75.0)
NEUTROS ABS: 3.1 10*3/uL (ref 1.5–6.5)
Platelets: 120 10*3/uL — ABNORMAL LOW (ref 140–400)
RBC: 2.3 10*6/uL — ABNORMAL LOW (ref 4.20–5.82)
RDW: 15.7 % — AB (ref 11.0–14.6)
WBC: 4.4 10*3/uL (ref 4.0–10.3)
nRBC: 0 % (ref 0–0)

## 2016-04-11 LAB — PREPARE RBC (CROSSMATCH)

## 2016-04-11 MED ORDER — SODIUM CHLORIDE 0.9 % IV SOLN
250.0000 mL | Freq: Once | INTRAVENOUS | Status: AC
  Administered 2016-04-11: 250 mL via INTRAVENOUS

## 2016-04-11 MED ORDER — FUROSEMIDE 10 MG/ML IJ SOLN
20.0000 mg | Freq: Once | INTRAMUSCULAR | Status: AC
  Administered 2016-04-11: 20 mg via INTRAVENOUS
  Filled 2016-04-11: qty 2

## 2016-04-11 MED ORDER — SODIUM CHLORIDE 0.9 % IV SOLN: 250.0000 mL | Freq: Once | INTRAVENOUS | Status: DC

## 2016-04-11 NOTE — Progress Notes (Signed)
Called in [place] on patient. Patient to get 2 units of RBC at sickle cell this morning, patient to go ahead and come over. Informed patient, orders placed in sign and held.

## 2016-04-11 NOTE — Discharge Instructions (Signed)

## 2016-04-11 NOTE — Progress Notes (Signed)
Dx: Diagnosis Association: Symptomatic anemia (285.9)  MD: Betsy Coder  Procedure: Pt recieved 2 units of PRBCs with 20 mg of Lasix given between the units.  Pt tolerated the procedure well.  Post procedure: Pt alert, oriented and ambulatory at discharge.

## 2016-04-12 LAB — TYPE AND SCREEN
ABO/RH(D): O POS
ANTIBODY SCREEN: NEGATIVE
UNIT DIVISION: 0
UNIT DIVISION: 0

## 2016-04-18 ENCOUNTER — Ambulatory Visit (HOSPITAL_COMMUNITY)
Admission: RE | Admit: 2016-04-18 | Discharge: 2016-04-18 | Disposition: A | Discharge status: Home / Self Care | Payer: PPO | Source: Ambulatory Visit | Attending: Family Medicine | Admitting: Family Medicine

## 2016-04-18 ENCOUNTER — Other Ambulatory Visit (HOSPITAL_BASED_OUTPATIENT_CLINIC_OR_DEPARTMENT_OTHER): Payer: PPO

## 2016-04-18 ENCOUNTER — Other Ambulatory Visit: Payer: Self-pay | Admitting: *Deleted

## 2016-04-18 VITALS — BP 121/51 | HR 78 | Temp 97.7°F | Resp 20

## 2016-04-18 DIAGNOSIS — D500 Iron deficiency anemia secondary to blood loss (chronic): Secondary | ICD-10-CM | POA: Diagnosis not present

## 2016-04-18 DIAGNOSIS — C160 Malignant neoplasm of cardia: Secondary | ICD-10-CM

## 2016-04-18 LAB — CBC WITH DIFFERENTIAL/PLATELET
BASO%: 0.7 % (ref 0.0–2.0)
BASOS ABS: 0 10*3/uL (ref 0.0–0.1)
EOS%: 3 % (ref 0.0–7.0)
Eosinophils Absolute: 0.1 10*3/uL (ref 0.0–0.5)
HEMATOCRIT: 24.7 % — AB (ref 38.4–49.9)
HEMOGLOBIN: 7.8 g/dL — AB (ref 13.0–17.1)
LYMPH#: 0.5 10*3/uL — AB (ref 0.9–3.3)
LYMPH%: 11.5 % — ABNORMAL LOW (ref 14.0–49.0)
MCH: 28.8 pg (ref 27.2–33.4)
MCHC: 31.3 g/dL — ABNORMAL LOW (ref 32.0–36.0)
MCV: 92 fL (ref 79.3–98.0)
MONO#: 0.4 10*3/uL (ref 0.1–0.9)
MONO%: 11.1 % (ref 0.0–14.0)
NEUT#: 2.9 10*3/uL (ref 1.5–6.5)
NEUT%: 73.7 % (ref 39.0–75.0)
PLATELETS: 142 10*3/uL (ref 140–400)
RBC: 2.69 10*6/uL — ABNORMAL LOW (ref 4.20–5.82)
RDW: 16.8 % — ABNORMAL HIGH (ref 11.0–14.6)
WBC: 4 10*3/uL (ref 4.0–10.3)

## 2016-04-18 LAB — PREPARE RBC (CROSSMATCH)

## 2016-04-18 MED ORDER — SODIUM CHLORIDE 0.9% FLUSH: 3.0000 mL | INTRAVENOUS | Status: DC | PRN

## 2016-04-18 MED ORDER — SODIUM CHLORIDE 0.9 % IV SOLN: 250.0000 mL | Freq: Once | INTRAVENOUS | Status: DC

## 2016-04-18 MED ORDER — HEPARIN SOD (PORK) LOCK FLUSH 100 UNIT/ML IV SOLN: 250.0000 [IU] | INTRAVENOUS | Status: DC | PRN

## 2016-04-18 MED ORDER — SODIUM CHLORIDE 0.9 % IV SOLN
250.0000 mL | Freq: Once | INTRAVENOUS | Status: AC
  Administered 2016-04-18: 250 mL via INTRAVENOUS

## 2016-04-18 MED ORDER — SODIUM CHLORIDE 0.9% FLUSH: 10.0000 mL | INTRAVENOUS | Status: DC | PRN

## 2016-04-18 MED ORDER — HEPARIN SOD (PORK) LOCK FLUSH 100 UNIT/ML IV SOLN: 500.0000 [IU] | Freq: Every day | INTRAVENOUS | Status: DC | PRN

## 2016-04-18 MED ORDER — FUROSEMIDE 10 MG/ML IJ SOLN
20.0000 mg | Freq: Once | INTRAMUSCULAR | Status: AC
  Administered 2016-04-18: 20 mg via INTRAVENOUS
  Filled 2016-04-18: qty 2

## 2016-04-18 NOTE — Discharge Instructions (Signed)
Blood Transfusion, Care After °Refer to this sheet in the next few weeks. These instructions provide you with information about caring for yourself after your procedure. Your health care provider may also give you more specific instructions. Your treatment has been planned according to current medical practices, but problems sometimes occur. Call your health care provider if you have any problems or questions after your procedure. °WHAT TO EXPECT AFTER THE PROCEDURE °After your procedure, it is common to have: °· Bruising and soreness at the IV site. °· Chills or fever. °· Headache. °HOME CARE INSTRUCTIONS °· Take medicines only as directed by your health care provider. Ask your health care provider if you can take an over-the-counter pain reliever in case you have a fever or headache a day or two after your transfusion. °· Return to your normal activities as directed by your health care provider. °SEEK MEDICAL CARE IF:  °· You develop redness or irritation at your IV site. °· You have persistent fever, chills, or headache. °· Your urine is darker than normal. °· Your urine turns pink, red, or brown.   °· The white part of your eye turns yellow (jaundice).   °· You feel weak after doing your normal activities.   °SEEK IMMEDIATE MEDICAL CARE IF:  °· You have trouble breathing. °· You have fever and chills along with: °¨ Anxiety. °¨ Chest or back pain. °¨ Flushed skin. °¨ Clammy skin. °¨ A rapid heartbeat. °¨ Nausea. °  °This information is not intended to replace advice given to you by your health care provider. Make sure you discuss any questions you have with your health care provider. °  °Document Released: 12/19/2014 Document Reviewed: 12/19/2014 °Elsevier Interactive Patient Education ©2016 Elsevier Inc. ° °

## 2016-04-18 NOTE — Progress Notes (Signed)
Dx: Diagnosis Association: Symptomatic anemia (285.9)  MD: Betsy Coder  Procedure: Pt recieved 2 units of PRBCs with 20 mg of Lasix given between the units.  Pt tolerated the procedure well.  Post procedure: Pt alert, oriented and ambulatory at discharge.

## 2016-04-18 NOTE — Progress Notes (Signed)
Pt.'s HGB-7.8 today.  Spoke with pt in lobby and he states that he is "weak, tired, having increased SOB and continues to have black stools".  Arvid Right NP and Dr. Benay Spice aware and order received for pt to receive 2 units of PRBC's today.    Pt will receive 2 units of PRBC's at [facility] today at 10:00AM.  Pt in lobby and aware of MD orders.  Pt has no questions at this time.

## 2016-04-19 LAB — TYPE AND SCREEN
ABO/RH(D): O POS
Antibody Screen: NEGATIVE
UNIT DIVISION: 0
UNIT DIVISION: 0

## 2016-04-19 LAB — CEA: CEA: 0.6 ng/mL (ref 0.0–4.7)

## 2016-04-19 LAB — CEA (PARALLEL TESTING): CEA: <0.5 ng/mL

## 2016-04-22 ENCOUNTER — Other Ambulatory Visit: Payer: Self-pay | Admitting: Gastroenterology

## 2016-04-22 DIAGNOSIS — D500 Iron deficiency anemia secondary to blood loss (chronic): Secondary | ICD-10-CM | POA: Diagnosis not present

## 2016-04-22 DIAGNOSIS — K921 Melena: Secondary | ICD-10-CM | POA: Diagnosis not present

## 2016-04-25 ENCOUNTER — Ambulatory Visit (HOSPITAL_BASED_OUTPATIENT_CLINIC_OR_DEPARTMENT_OTHER): Payer: PPO | Admitting: Oncology

## 2016-04-25 ENCOUNTER — Other Ambulatory Visit (HOSPITAL_BASED_OUTPATIENT_CLINIC_OR_DEPARTMENT_OTHER): Payer: PPO

## 2016-04-25 ENCOUNTER — Telehealth: Payer: Self-pay | Admitting: Oncology

## 2016-04-25 VITALS — BP 134/89 | HR 96 | Temp 97.7°F | Resp 18 | Ht 72.0 in | Wt 203.6 lb

## 2016-04-25 DIAGNOSIS — D500 Iron deficiency anemia secondary to blood loss (chronic): Secondary | ICD-10-CM | POA: Diagnosis not present

## 2016-04-25 DIAGNOSIS — K769 Liver disease, unspecified: Secondary | ICD-10-CM | POA: Diagnosis not present

## 2016-04-25 DIAGNOSIS — C160 Malignant neoplasm of cardia: Secondary | ICD-10-CM | POA: Diagnosis not present

## 2016-04-25 DIAGNOSIS — C155 Malignant neoplasm of lower third of esophagus: Secondary | ICD-10-CM | POA: Diagnosis not present

## 2016-04-25 DIAGNOSIS — E119 Type 2 diabetes mellitus without complications: Secondary | ICD-10-CM | POA: Diagnosis not present

## 2016-04-25 DIAGNOSIS — D649 Anemia, unspecified: Secondary | ICD-10-CM

## 2016-04-25 DIAGNOSIS — Z8719 Personal history of other diseases of the digestive system: Secondary | ICD-10-CM

## 2016-04-25 LAB — CBC WITH DIFFERENTIAL/PLATELET
BASO%: 0.3 % (ref 0.0–2.0)
BASOS ABS: 0 10*3/uL (ref 0.0–0.1)
EOS ABS: 0.2 10*3/uL (ref 0.0–0.5)
EOS%: 4.2 % (ref 0.0–7.0)
HEMATOCRIT: 26.3 % — AB (ref 38.4–49.9)
HGB: 8.2 g/dL — ABNORMAL LOW (ref 13.0–17.1)
LYMPH#: 0.6 10*3/uL — AB (ref 0.9–3.3)
LYMPH%: 15.8 % (ref 14.0–49.0)
MCH: 28.3 pg (ref 27.2–33.4)
MCHC: 31.2 g/dL — AB (ref 32.0–36.0)
MCV: 90.7 fL (ref 79.3–98.0)
MONO#: 0.4 10*3/uL (ref 0.1–0.9)
MONO%: 10 % (ref 0.0–14.0)
NEUT#: 2.6 10*3/uL (ref 1.5–6.5)
NEUT%: 69.7 % (ref 39.0–75.0)
Platelets: 123 10*3/uL — ABNORMAL LOW (ref 140–400)
RBC: 2.9 10*6/uL — ABNORMAL LOW (ref 4.20–5.82)
RDW: 16.7 % — ABNORMAL HIGH (ref 11.0–14.6)
WBC: 3.8 10*3/uL — ABNORMAL LOW (ref 4.0–10.3)

## 2016-04-25 LAB — PREPARE RBC (CROSSMATCH)

## 2016-04-25 MED ORDER — PANTOPRAZOLE SODIUM 40 MG PO TBEC: 40.0000 mg | DELAYED_RELEASE_TABLET | Freq: Two times a day (BID) | ORAL | Status: DC

## 2016-04-25 NOTE — Progress Notes (Signed)
[place] OFFICE PROGRESS NOTE   Diagnosis: Gastroesophageal carcinoma, anemia  INTERVAL HISTORY:   Walter French returns as scheduled. He was last transfused with packed red blood cells on 04/18/2016. He noted improvement in his dyspnea and energy following the red cell transfusion. The stool is now brown. He is scheduled to undergo a repeat endoscopy on 04/28/2016.  Objective:  Vital signs in last 24 hours:  Blood pressure 134/89, pulse 96, temperature 97.7 F (36.5 C), temperature source Oral, resp. rate 18, height 6' (1.829 m), weight 203 lb 9.6 oz (92.352 kg), SpO2 100 %.    HEENT: Neck without mass Lymphatics: No cervical, supraclavicular, or axillary nodes Resp: Coarse rhonchi at the lower posterior chest on the left greater than right, no respiratory distress Cardio: Regular rate and rhythm GI: No hepatomegaly, nontender Vascular: No leg edema   Lab Results:  Lab Results  Component Value Date   WBC 3.8* 04/25/2016   HGB 8.2* 04/25/2016   HCT 26.3* 04/25/2016   MCV 90.7 04/25/2016   PLT 123* 04/25/2016   NEUTROABS 2.6 04/25/2016      Lab Results  Component Value Date   CEA1 0.6 04/18/2016    Imaging: CT abdomen/pelvis 04/01/2016-images reviewed with Walter French   Medications: I have reviewed the patient's current medications.  Assessment/Plan: 1. Adenocarcinoma the gastroesophageal junction, status post an endoscopic biopsy of a distal esophagus mass and gastric cardia ulcer on 07/23/2015  Staging CT scans consistent with locally metastatic adenopathy  PET scan 08/07/2015 with hypermetabolic mass at the distal esophagus/GE junction and a hypermetabolic paraesophageal, gastrohepatic, and portacaval lymph nodes  Initiation of radiation 08/12/2015 with completion 09/24/2015; cycle 1 Taxol/carboplatin 08/13/2015; Final cycle given 09/22/2015.  CT and a/pelvis 04/01/2016 with improvement of the esophageal tumor, decreased paraesophageal lymph  nodes, abnormal wall thickening of the gastric antrum, new bilobed mass between the aorta and vena cava, ill-defined low-attenuation segment 5 hepatic lesion 2. History of coronary artery disease 3. Congestive heart failure 4. History of ventricular tachycardia and supraventricular tachycardia 5. Peripheral arterial disease 6. Implantable defibrillator 7. Diabetes 8. Gout 9. Hospitalization from 09/10/15 to 09/15/15 due to Acute Hypoxic Respiratory Failure and Sepsis as well as new onset of Atrial Fibrillation, 10. Hospitalization 09/30/2015 through 10/02/2015 with probable UTI/bronchitis. 11. Hospitalization 10/05/2015 with rapid atrial fibrillation 12. 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 Hospitalization with GI bleed 02/17/2016 through 02/22/2016 status post upper endoscopy 02/18/2016 with the source of bleeding felt to be hemorrhagic gastritis. There was no evidence of residual mass in the distal esophagus. He continues twice daily Protonix. 14. Anemia most likely due to gastrointestinal bleeding. He is followed by GI.   Upper endoscopy 04/05/2016 showed nonbleeding esophageal and proximal gastric ulcers. Localized hemorrhagic mucosa with bleeding and stigmata of recent bleeding found in the cardia, gastric fundus, greater curvature of the stomach and lesser curvature of the stomach. Fulguration was performed.    Disposition:  Walter French has persistent anemia, though the hemoglobin is better after the transfusion last week and he reports the stool is now brown. We will arrange for 2 more units of packed red blood cells to be given on 04/26/2016. He will undergo an endoscopy later this week. Walter French will return for a lab visit on 05/04/2016 and an office visit on 05/12/2016.  The CT on 04/01/2016 reveals an indeterminate liver lesion and an enlarged aortocaval node. He understands these findings could represent progression of  the gastroesophageal carcinoma.  I do not recommend a biopsy or further evaluation at present. He appears to be asymptomatic if these lesions are related to gastroesophageal carcinoma.  Betsy Coder, MD  04/25/2016  4:44 PM

## 2016-04-25 NOTE — Telephone Encounter (Signed)
Gave and printed appt sched and avs for pt for May and June  °

## 2016-04-26 ENCOUNTER — Ambulatory Visit (HOSPITAL_BASED_OUTPATIENT_CLINIC_OR_DEPARTMENT_OTHER): Payer: PPO

## 2016-04-26 VITALS — BP 124/68 | HR 81 | Temp 97.6°F | Resp 18

## 2016-04-26 DIAGNOSIS — D500 Iron deficiency anemia secondary to blood loss (chronic): Secondary | ICD-10-CM | POA: Diagnosis not present

## 2016-04-26 MED ORDER — FUROSEMIDE 10 MG/ML IJ SOLN
INTRAMUSCULAR | Status: AC
  Filled 2016-04-26: qty 2

## 2016-04-26 MED ORDER — SODIUM CHLORIDE 0.9 % IV SOLN
250.0000 mL | Freq: Once | INTRAVENOUS | Status: AC
  Administered 2016-04-26: 250 mL via INTRAVENOUS

## 2016-04-26 MED ORDER — FUROSEMIDE 10 MG/ML IJ SOLN
20.0000 mg | Freq: Once | INTRAMUSCULAR | Status: AC
  Administered 2016-04-26: 20 mg via INTRAVENOUS

## 2016-04-26 NOTE — Patient Instructions (Signed)

## 2016-04-27 ENCOUNTER — Encounter (HOSPITAL_COMMUNITY): Payer: Self-pay | Admitting: *Deleted

## 2016-04-27 LAB — TYPE AND SCREEN
ABO/RH(D): O POS
ANTIBODY SCREEN: NEGATIVE
UNIT DIVISION: 0
Unit division: 0

## 2016-04-27 NOTE — Progress Notes (Signed)
Pt has an extensive cardiac history and cardiologist is Dr. Martinique. Last office visit with Dr. Martinique was 03/02/16. Pt denies any recent chest pain. He states he always has sob, said " I've had it my whole life". Pt states he's no longer diabetic.  EKG - 03/10/16- in EPIC Echo - 07/23/15 - in EPIC Cath - 07/22/15 - in EP[facility] Stress - 07/21/15 - in EPIC

## 2016-04-28 ENCOUNTER — Ambulatory Visit (HOSPITAL_COMMUNITY): Payer: PPO | Admitting: Certified Registered"

## 2016-04-28 ENCOUNTER — Encounter (HOSPITAL_COMMUNITY): Payer: Self-pay | Admitting: *Deleted

## 2016-04-28 ENCOUNTER — Ambulatory Visit (HOSPITAL_COMMUNITY)
Admission: RE | Admit: 2016-04-28 | Discharge: 2016-04-28 | Disposition: A | Discharge status: Home / Self Care | Payer: PPO | Source: Ambulatory Visit | Attending: Gastroenterology | Admitting: Gastroenterology

## 2016-04-28 ENCOUNTER — Encounter (HOSPITAL_COMMUNITY)
Admission: RE | Disposition: A | Discharge status: Home / Self Care | Payer: Self-pay | Source: Ambulatory Visit | Attending: Gastroenterology

## 2016-04-28 DIAGNOSIS — Z79891 Long term (current) use of opiate analgesic: Secondary | ICD-10-CM | POA: Insufficient documentation

## 2016-04-28 DIAGNOSIS — I509 Heart failure, unspecified: Secondary | ICD-10-CM | POA: Diagnosis not present

## 2016-04-28 DIAGNOSIS — E039 Hypothyroidism, unspecified: Secondary | ICD-10-CM | POA: Insufficient documentation

## 2016-04-28 DIAGNOSIS — K259 Gastric ulcer, unspecified as acute or chronic, without hemorrhage or perforation: Secondary | ICD-10-CM | POA: Diagnosis not present

## 2016-04-28 DIAGNOSIS — Z923 Personal history of irradiation: Secondary | ICD-10-CM | POA: Insufficient documentation

## 2016-04-28 DIAGNOSIS — K31819 Angiodysplasia of stomach and duodenum without bleeding: Secondary | ICD-10-CM | POA: Insufficient documentation

## 2016-04-28 DIAGNOSIS — K2210 Ulcer of esophagus without bleeding: Secondary | ICD-10-CM | POA: Insufficient documentation

## 2016-04-28 DIAGNOSIS — K219 Gastro-esophageal reflux disease without esophagitis: Secondary | ICD-10-CM | POA: Diagnosis not present

## 2016-04-28 DIAGNOSIS — I739 Peripheral vascular disease, unspecified: Secondary | ICD-10-CM | POA: Diagnosis not present

## 2016-04-28 DIAGNOSIS — G4730 Sleep apnea, unspecified: Secondary | ICD-10-CM | POA: Diagnosis not present

## 2016-04-28 DIAGNOSIS — I2510 Atherosclerotic heart disease of native coronary artery without angina pectoris: Secondary | ICD-10-CM | POA: Diagnosis not present

## 2016-04-28 DIAGNOSIS — Z95810 Presence of automatic (implantable) cardiac defibrillator: Secondary | ICD-10-CM | POA: Diagnosis not present

## 2016-04-28 DIAGNOSIS — D649 Anemia, unspecified: Secondary | ICD-10-CM | POA: Diagnosis not present

## 2016-04-28 DIAGNOSIS — I10 Essential (primary) hypertension: Secondary | ICD-10-CM | POA: Diagnosis not present

## 2016-04-28 DIAGNOSIS — D500 Iron deficiency anemia secondary to blood loss (chronic): Secondary | ICD-10-CM | POA: Diagnosis not present

## 2016-04-28 DIAGNOSIS — E1151 Type 2 diabetes mellitus with diabetic peripheral angiopathy without gangrene: Secondary | ICD-10-CM | POA: Insufficient documentation

## 2016-04-28 DIAGNOSIS — K9289 Other specified diseases of the digestive system: Secondary | ICD-10-CM | POA: Diagnosis not present

## 2016-04-28 DIAGNOSIS — I130 Hypertensive heart and chronic kidney disease with heart failure and stage 1 through stage 4 chronic kidney disease, or unspecified chronic kidney disease: Secondary | ICD-10-CM | POA: Diagnosis not present

## 2016-04-28 DIAGNOSIS — R195 Other fecal abnormalities: Secondary | ICD-10-CM | POA: Insufficient documentation

## 2016-04-28 DIAGNOSIS — Z79899 Other long term (current) drug therapy: Secondary | ICD-10-CM | POA: Diagnosis not present

## 2016-04-28 DIAGNOSIS — K449 Diaphragmatic hernia without obstruction or gangrene: Secondary | ICD-10-CM | POA: Diagnosis not present

## 2016-04-28 DIAGNOSIS — Z951 Presence of aortocoronary bypass graft: Secondary | ICD-10-CM | POA: Insufficient documentation

## 2016-04-28 DIAGNOSIS — Z87891 Personal history of nicotine dependence: Secondary | ICD-10-CM | POA: Insufficient documentation

## 2016-04-28 DIAGNOSIS — I781 Nevus, non-neoplastic: Secondary | ICD-10-CM | POA: Diagnosis not present

## 2016-04-28 HISTORY — PX: HOT HEMOSTASIS: SHX5433

## 2016-04-28 HISTORY — PX: ESOPHAGOGASTRODUODENOSCOPY (EGD) WITH PROPOFOL: SHX5813

## 2016-04-28 HISTORY — DX: Anemia, unspecified: D64.9

## 2016-04-28 HISTORY — DX: Personal history of other medical treatment: Z92.89

## 2016-04-28 HISTORY — DX: Reserved for inherently not codable concepts without codable children: IMO0001

## 2016-04-28 SURGERY — ESOPHAGOGASTRODUODENOSCOPY (EGD) WITH PROPOFOL
Anesthesia: Monitor Anesthesia Care

## 2016-04-28 MED ORDER — PROPOFOL 10 MG/ML IV BOLUS
INTRAVENOUS | Status: DC | PRN
  Administered 2016-04-28: 20 mg via INTRAVENOUS

## 2016-04-28 MED ORDER — FENTANYL CITRATE (PF) 250 MCG/5ML IJ SOLN
INTRAMUSCULAR | Status: DC | PRN
  Administered 2016-04-28: 50 ug via INTRAVENOUS

## 2016-04-28 MED ORDER — PROPOFOL 500 MG/50ML IV EMUL
INTRAVENOUS | Status: DC | PRN
  Administered 2016-04-28: 100 ug/kg/min via INTRAVENOUS

## 2016-04-28 MED ORDER — LIDOCAINE HCL (CARDIAC) 20 MG/ML IV SOLN
INTRAVENOUS | Status: DC | PRN
  Administered 2016-04-28: 40 mg via INTRATRACHEAL

## 2016-04-28 MED ORDER — LACTATED RINGERS IV SOLN
INTRAVENOUS | Status: DC | PRN
  Administered 2016-04-28: 12:00:00 via INTRAVENOUS

## 2016-04-28 MED ORDER — SODIUM CHLORIDE 0.9 % IV SOLN
INTRAVENOUS | Status: DC
Start: 2016-04-28 — End: 2016-04-28

## 2016-04-28 MED ORDER — BUTAMBEN-TETRACAINE-BENZOCAINE 2-2-14 % EX AERO
INHALATION_SPRAY | CUTANEOUS | Status: DC | PRN
  Administered 2016-04-28: 1 via TOPICAL

## 2016-04-28 NOTE — Anesthesia Preprocedure Evaluation (Addendum)
Anesthesia Evaluation  Patient identified by MRN, date of birth, ID band Patient awake    Reviewed: Allergy & Precautions, NPO status , Patient's Chart, lab work & pertinent test results  Airway Mallampati: I  TM Distance: >3 FB Neck ROM: Full    Dental  (+) Edentulous Upper, Dental Advisory Given, Edentulous Lower   Pulmonary shortness of breath, asthma , sleep apnea , former smoker,     + decreased breath sounds      Cardiovascular hypertension, Pt. on home beta blockers + CAD, + CABG, + Peripheral Vascular Disease and +CHF  + Cardiac Defibrillator  Rhythm:Regular     Neuro/Psych PSYCHIATRIC DISORDERS Anxiety negative neurological ROS     GI/Hepatic hiatal hernia, GERD  Medicated,  Endo/Other  diabetes, Type 2Hypothyroidism   Renal/GU      Musculoskeletal   Abdominal   Peds  Hematology  (+) anemia ,   Anesthesia Other Findings EF 30-35%  Reproductive/Obstetrics                          Anesthesia Physical Anesthesia Plan  ASA: III  Anesthesia Plan: MAC   Post-op Pain Management:    Induction: Intravenous  Airway Management Planned: Natural Airway, Nasal Cannula and Simple Face Mask  Additional Equipment: None  Intra-op Plan:   Post-operative Plan: Extubation in OR  Informed Consent: I have reviewed the patients History and Physical, chart, labs and discussed the procedure including the risks, benefits and alternatives for the proposed anesthesia with the patient or authorized representative who has indicated his/her understanding and acceptance.   Dental advisory given  Plan Discussed with: CRNA and Surgeon  Anesthesia Plan Comments:         Anesthesia Quick Evaluation                                   Anesthesia Evaluation  Patient identified by MRN, date of birth, ID band Patient awake    Reviewed: Allergy & Precautions, NPO status , Patient's Chart, lab work  & pertinent test results  Airway Mallampati: I       Dental  (+) Edentulous Upper, Edentulous Lower   Pulmonary asthma , sleep apnea , former smoker,     + wheezing      Cardiovascular hypertension, + CAD, + CABG, + Peripheral Vascular Disease and +CHF  + dysrhythmias Atrial Fibrillation, Supra Ventricular Tachycardia and Ventricular Tachycardia + Cardiac Defibrillator  Rhythm:Irregular     Neuro/Psych PSYCHIATRIC DISORDERS Anxiety negative neurological ROS     GI/Hepatic hiatal hernia, GERD  Medicated,  Endo/Other  diabetesHypothyroidism   Renal/GU CRFRenal disease  negative genitourinary   Musculoskeletal   Abdominal   Peds negative pediatric ROS (+)  Hematology   Anesthesia Other Findings   Reproductive/Obstetrics                             Lab Results  Component Value Date   WBC 3.8* 04/25/2016   HGB 8.2* 04/25/2016   HCT 26.3* 04/25/2016   MCV 90.7 04/25/2016   PLT 123* 04/25/2016   Lab Results  Component Value Date   CREATININE 0.86 03/18/2016   BUN 14 03/18/2016   NA 135 03/18/2016   K 3.5 03/18/2016   CL 103 03/18/2016   CO2 22 03/18/2016   Lab Results  Component Value Date   INR 1.34  02/17/2016   INR 1.54* 10/01/2015   INR 1.06 09/10/2015     Anesthesia Physical Anesthesia Plan  ASA: III  Anesthesia Plan: MAC   Post-op Pain Management:    Induction: Intravenous  Airway Management Planned: Mask  Additional Equipment:   Intra-op Plan:   Post-operative Plan:   Informed Consent: I have reviewed the patients History and Physical, chart, labs and discussed the procedure including the risks, benefits and alternatives for the proposed anesthesia with the patient or authorized representative who has indicated his/her understanding and acceptance.     Plan Discussed with: CRNA  Anesthesia Plan Comments:         Anesthesia Quick Evaluation

## 2016-04-28 NOTE — Op Note (Signed)
[facility] Patient Name: Walter French Procedure Date : 04/28/2016 MRN: [id number] Attending MD: Clarene Essex , MD Date of Birth: 1945-12-23 CSN: [id number] Age: 70 Admit Type: Outpatient Procedure:                Upper GI endoscopy Indications:              Iron deficiency anemia secondary to chronic blood                            loss, Heme positive stool Providers:                Clarene Essex, MD, Cleda Daub, RN, Cherylynn Ridges,                            Technician, Sampson Si, CRNA Referring MD:              Medicines:                Propofol total dose 280 mg IV, Cetacaine spray, 40                            mg IV lidocaine Complications:            No immediate complications. Estimated Blood Loss:     Estimated blood loss: none. Procedure:                Pre-Anesthesia Assessment:                           - Prior to the procedure, a History and Physical                            was performed, and patient medications and                            allergies were reviewed. [name] tolerance of                            previous anesthesia was also reviewed. The risks                            and benefits of the procedure and the sedation                            options and risks were discussed with the patient.                            All questions were answered, and informed consent                            was obtained. Prior Anticoagulants: The patient has                            taken no previous anticoagulant or antiplatelet  agents. ASA Grade Assessment: III - A patient with                            severe systemic disease. After reviewing the risks                            and benefits, the patient was deemed in                            satisfactory condition to undergo the procedure.                           After obtaining informed consent, the endoscope was                            passed  under direct vision. Throughout the                            procedure, the patient's blood pressure, pulse, and                            oxygen saturations were monitored continuously. The                            [id number]) scope was introduced through the                            mouth, and advanced to the second part of duodenum.                            The upper GI endoscopy was accomplished without                            difficulty. The patient tolerated the procedure                            well. Scope In: Scope Out: Findings:      A small hiatal hernia was present.      One distal linear esophageal ulcer with no bleeding and no stigmata of       recent bleeding was found.      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 Coagulation for bleeding prevention       using argon beam at 1 liter/minute and 20 watts was successful.      One non-bleeding cratered gastric ulcer with no stigmata of bleeding was       found in the cardia.      The duodenal bulb, first portion of the duodenum and second portion of       the duodenum were normal.      The exam was otherwise without abnormality. Impression:               -  Small hiatal hernia.                           - Non-bleeding esophageal ulcer.                           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 Treated with argon beam coagulation.                           - Non-bleeding gastric ulcer with no stigmata of                            bleeding.                           - Normal duodenal bulb, first portion of the                            duodenum and second  portion of the duodenum.                           - The examination was otherwise normal.                           - No specimens collected. Moderate Sedation:      moderate sedation-none Recommendation:           - Patient has a contact number available for                            emergencies. The signs and symptoms of potential                            delayed complications were discussed with the                            patient. Return to normal activities tomorrow.                            Written discharge instructions were provided to the                            patient.                           - Soft diet today.                           - Continue present medications.                           -  Return to GI clinic PRN.                           - Telephone GI clinic if symptomatic PRN.                           - Consider referral to [facility] if problem continues Procedure Code(s):        --- Professional ---                           [phone], Esophagogastroduodenoscopy, flexible,                            transoral; with control of bleeding, any method Diagnosis Code(s):        --- Professional ---                           K44.9, Diaphragmatic hernia without obstruction or                            gangrene                           K22.10, Ulcer of esophagus without bleeding                           K31.811, Angiodysplasia of stomach and duodenum                            with bleeding                           K25.9, Gastric ulcer, unspecified as acute or                            chronic, without hemorrhage or perforation                           D50.0, Iron deficiency anemia secondary to blood                            loss (chronic)                           R19.5, Other fecal abnormalities CPT copyright 2016 American Medical Association. All rights reserved. The codes documented in this report are preliminary and upon coder review may  be revised  to meet current compliance requirements. Clarene Essex, MD 04/28/2016 1:00:28 PM This report has been signed electronically. Number of Addenda: 0

## 2016-04-28 NOTE — Progress Notes (Signed)
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

## 2016-04-28 NOTE — Discharge Instructions (Signed)
Call if question or problem from a GI standpoint otherwise follow-up as needed or call if having recurrent bleeding to set up a procedure at [facility] for radiofrequency ablation of the oozing radiation damageEsophagogastroduodenoscopy, Care After Refer to this sheet in the next few weeks. These instructions provide you with information about caring for yourself after your procedure. Your health care provider may also give you more specific instructions. Your treatment has been planned according to current medical practices, but problems sometimes occur. Call your health care provider if you have any problems or questions after your procedure. WHAT TO EXPECT AFTER THE PROCEDURE After your procedure, it is typical to feel:  Soreness in your throat.  Pain with swallowing.  Sick to your stomach (nauseous).  Bloated.  Dizzy.  Fatigued. HOME CARE INSTRUCTIONS  Do not eat or drink anything until the numbing medicine (local anesthetic) has worn off and your gag reflex has returned. You will know that the local anesthetic has worn off when you can swallow comfortably.  Do not drive or operate machinery until directed by your health care provider.  Take medicines only as directed by your health care provider. SEEK MEDICAL CARE IF:   You cannot stop coughing.  You are not urinating at all or less than usual. SEEK IMMEDIATE MEDICAL CARE IF:  You have difficulty swallowing.  You cannot eat or drink.  You have worsening throat or chest pain.  You have dizziness or lightheadedness or you faint.  You have nausea or vomiting.  You have chills.  You have a fever.  You have severe abdominal pain.  You have black, tarry, or bloody stools.   This information is not intended to replace advice given to you by your health care provider. Make sure you discuss any questions you have with your health care provider.   Document Released: 11/14/2012 Document Revised: 12/19/2014 Document Reviewed:  11/14/2012 Elsevier Interactive Patient Education [employer].

## 2016-04-28 NOTE — Transfer of Care (Signed)
Immediate Anesthesia Transfer of Care Note  Patient: Walter French  Procedure(s) Performed: Procedure(s): ESOPHAGOGASTRODUODENOSCOPY (EGD) WITH PROPOFOL (N/A) HOT HEMOSTASIS (ARGON PLASMA COAGULATION/BICAP) (N/A)  Patient Location: Endoscopy Unit  Anesthesia Type:General  Level of Consciousness: awake, alert  and oriented  Airway & Oxygen Therapy: Patient Spontanous Breathing and Patient connected to nasal cannula oxygen  Post-op Assessment: Report given to RN  Post vital signs: Reviewed and stable  Last Vitals:  Filed Vitals:   04/28/16 1257 04/28/16 1301  BP:    Pulse: 92   Temp:  36.7 C  Resp: 28     Last Pain: There were no vitals filed for this visit.       Complications: No apparent anesthesia complications

## 2016-04-30 NOTE — Anesthesia Postprocedure Evaluation (Signed)
Anesthesia Post Note  Patient: [name]  Procedure(s) Performed: Procedure(s) (LRB): ESOPHAGOGASTRODUODENOSCOPY (EGD) WITH PROPOFOL (N/A) HOT HEMOSTASIS (ARGON PLASMA COAGULATION/BICAP) (N/A)  Patient location during evaluation: Endoscopy Anesthesia Type: MAC Level of consciousness: awake Pain management: pain level controlled Vital Signs Assessment: post-procedure vital signs reviewed and stable Respiratory status: spontaneous breathing Cardiovascular status: stable Postop Assessment: no signs of nausea or vomiting Anesthetic complications: no    Last Vitals:  Filed Vitals:   04/28/16 1310 04/28/16 1320  BP: 171/82 136/77  Pulse: 86 84  Temp:    Resp: 22 19    Last Pain: There were no vitals filed for this visit.               Matilda Fleig

## 2016-05-04 ENCOUNTER — Encounter: Payer: Self-pay | Admitting: *Deleted

## 2016-05-04 ENCOUNTER — Telehealth: Payer: Self-pay | Admitting: *Deleted

## 2016-05-04 ENCOUNTER — Other Ambulatory Visit (HOSPITAL_BASED_OUTPATIENT_CLINIC_OR_DEPARTMENT_OTHER): Payer: PPO

## 2016-05-04 ENCOUNTER — Telehealth: Payer: Self-pay | Admitting: Oncology

## 2016-05-04 ENCOUNTER — Ambulatory Visit
Admission: RE | Admit: 2016-05-04 | Discharge: 2016-05-04 | Disposition: A | Discharge status: Home / Self Care | Payer: PPO | Source: Ambulatory Visit | Attending: Radiation Oncology | Admitting: Radiation Oncology

## 2016-05-04 ENCOUNTER — Other Ambulatory Visit: Payer: Self-pay | Admitting: Nurse Practitioner

## 2016-05-04 ENCOUNTER — Encounter: Payer: Self-pay | Admitting: Radiation Oncology

## 2016-05-04 ENCOUNTER — Other Ambulatory Visit: Payer: PPO

## 2016-05-04 VITALS — BP 115/62 | HR 87 | Temp 97.6°F | Resp 16 | Ht 72.0 in | Wt 199.6 lb

## 2016-05-04 DIAGNOSIS — I2510 Atherosclerotic heart disease of native coronary artery without angina pectoris: Secondary | ICD-10-CM | POA: Insufficient documentation

## 2016-05-04 DIAGNOSIS — Z87891 Personal history of nicotine dependence: Secondary | ICD-10-CM | POA: Insufficient documentation

## 2016-05-04 DIAGNOSIS — D638 Anemia in other chronic diseases classified elsewhere: Secondary | ICD-10-CM

## 2016-05-04 DIAGNOSIS — E785 Hyperlipidemia, unspecified: Secondary | ICD-10-CM | POA: Diagnosis not present

## 2016-05-04 DIAGNOSIS — I509 Heart failure, unspecified: Secondary | ICD-10-CM | POA: Insufficient documentation

## 2016-05-04 DIAGNOSIS — K257 Chronic gastric ulcer without hemorrhage or perforation: Secondary | ICD-10-CM

## 2016-05-04 DIAGNOSIS — E118 Type 2 diabetes mellitus with unspecified complications: Secondary | ICD-10-CM | POA: Insufficient documentation

## 2016-05-04 DIAGNOSIS — I110 Hypertensive heart disease with heart failure: Secondary | ICD-10-CM | POA: Diagnosis not present

## 2016-05-04 DIAGNOSIS — D649 Anemia, unspecified: Secondary | ICD-10-CM | POA: Insufficient documentation

## 2016-05-04 DIAGNOSIS — D500 Iron deficiency anemia secondary to blood loss (chronic): Secondary | ICD-10-CM

## 2016-05-04 DIAGNOSIS — J45909 Unspecified asthma, uncomplicated: Secondary | ICD-10-CM | POA: Insufficient documentation

## 2016-05-04 DIAGNOSIS — M109 Gout, unspecified: Secondary | ICD-10-CM | POA: Diagnosis not present

## 2016-05-04 DIAGNOSIS — K219 Gastro-esophageal reflux disease without esophagitis: Secondary | ICD-10-CM | POA: Insufficient documentation

## 2016-05-04 DIAGNOSIS — G4733 Obstructive sleep apnea (adult) (pediatric): Secondary | ICD-10-CM | POA: Insufficient documentation

## 2016-05-04 DIAGNOSIS — C160 Malignant neoplasm of cardia: Secondary | ICD-10-CM | POA: Diagnosis not present

## 2016-05-04 DIAGNOSIS — Z951 Presence of aortocoronary bypass graft: Secondary | ICD-10-CM | POA: Diagnosis not present

## 2016-05-04 DIAGNOSIS — Z95810 Presence of automatic (implantable) cardiac defibrillator: Secondary | ICD-10-CM | POA: Diagnosis not present

## 2016-05-04 DIAGNOSIS — E669 Obesity, unspecified: Secondary | ICD-10-CM | POA: Insufficient documentation

## 2016-05-04 DIAGNOSIS — I739 Peripheral vascular disease, unspecified: Secondary | ICD-10-CM | POA: Diagnosis not present

## 2016-05-04 DIAGNOSIS — F419 Anxiety disorder, unspecified: Secondary | ICD-10-CM | POA: Diagnosis not present

## 2016-05-04 DIAGNOSIS — I471 Supraventricular tachycardia: Secondary | ICD-10-CM | POA: Diagnosis not present

## 2016-05-04 DIAGNOSIS — K259 Gastric ulcer, unspecified as acute or chronic, without hemorrhage or perforation: Secondary | ICD-10-CM | POA: Insufficient documentation

## 2016-05-04 DIAGNOSIS — K921 Melena: Secondary | ICD-10-CM | POA: Diagnosis not present

## 2016-05-04 DIAGNOSIS — D62 Acute posthemorrhagic anemia: Secondary | ICD-10-CM | POA: Diagnosis not present

## 2016-05-04 LAB — CBC WITH DIFFERENTIAL/PLATELET
BASO%: 0.3 % (ref 0.0–2.0)
BASOS ABS: 0 10*3/uL (ref 0.0–0.1)
EOS%: 2.8 % (ref 0.0–7.0)
Eosinophils Absolute: 0.1 10*3/uL (ref 0.0–0.5)
HEMATOCRIT: 30.6 % — AB (ref 38.4–49.9)
HEMOGLOBIN: 9.6 g/dL — AB (ref 13.0–17.1)
LYMPH#: 0.6 10*3/uL — AB (ref 0.9–3.3)
LYMPH%: 16.2 % (ref 14.0–49.0)
MCH: 28.2 pg (ref 27.2–33.4)
MCHC: 31.4 g/dL — ABNORMAL LOW (ref 32.0–36.0)
MCV: 90 fL (ref 79.3–98.0)
MONO#: 0.4 10*3/uL (ref 0.1–0.9)
MONO%: 10.6 % (ref 0.0–14.0)
NEUT%: 70.1 % (ref 39.0–75.0)
NEUTROS ABS: 2.8 10*3/uL (ref 1.5–6.5)
Platelets: 120 10*3/uL — ABNORMAL LOW (ref 140–400)
RBC: 3.4 10*6/uL — ABNORMAL LOW (ref 4.20–5.82)
RDW: 15.4 % — AB (ref 11.0–14.6)
WBC: 4 10*3/uL (ref 4.0–10.3)

## 2016-05-04 NOTE — Telephone Encounter (Signed)
CALLED PATIENT TO INFORM OF FU ON 11-09-16 @ 10:30 AM WITH ALISON PERKINS, SPOKE WITH PATIENT AND HE IS AWARE OF THIS APPT. AND GOOD WITH THIS APPT.

## 2016-05-04 NOTE — Progress Notes (Signed)
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 The patient then received a 9 gray boost using a 6 field technique. [name] final dose was 54 gray which was given with concurrent chemotherapy.  Narrative:  The patient returns today for routine follow-up.  Of note he has had chronic recurrence of gastric ulcers, and has been under the care of Dr. Benay Spice and Dr. Watt Climes with close follow-up. He received 1 unit of packed red blood cells as a result of the hemoglobin of 8.2 on 04/25/2016. His transfusion was on 04/27/2016 he has plans to be seen in follow-up with Ned Card next week. On 04/28/2016, he did undergo endoscopy which revealed a small hiatal hernia, a nonbleeding esophageal ulcer, and multiple angiodysplastic lesions consistent with telangiectasias in the stomach treated with argon beam coagulation. A nonbleeding gastric ulcer with no stigmata of bleeding was also noted, otherwise the duodenal bulb and remaining tissue was normal.                        On review of systems, the patient reports that he is doing well overall. He has shortness of breath at baseline and states this is stable for him. He denies any chest pain, cough, fevers, chills, night sweats, unintended weight changes. He continues to note melena and denies any hematochezia. He denies any  bladder disturbances, and denies abdominal pain, nausea or  vomiting. He denies any new musculoskeletal or joint aches or pains. A complete review of systems is obtained and is otherwise negative.   Past Medical History:  Past Medical History  Diagnosis Date  . Hyperlipidemia   . Hypertension   . Coronary heart disease     a. s/p CABG x 3 (VG->PDA, VG->PL, LIMA->LAD);  b. 2013 Abnl Myoview;  c. 2013 Cath: 3/3 patent grafts, occluded LCX which correlated w/ ischemia on MV->not amenable to PCI->Med Rx.  . Obesity   . CHF (congestive heart failure) ([place])   . VT (ventricular tachycardia) ([place])   . SVT (supraventricular tachycardia) ([place])   . PAD (peripheral artery disease) ([place])     Angiography in February of 2014: Moderate left iliac artery stenosis, mild to moderate diffuse right SFA disease. Severe proximal right popliteal artery stenosis with three-vessel runoff below the knee. Status post self-expanding stent placement to the proximal popliteal artery  . Implantable cardioverter-defibrillator Mdt   . Asthma     "small touch" (07/29/2013)  . History of pneumonia     "3-4 times; last time ~ 2003" (07/29/2013)  . GERD (gastroesophageal reflux disease)   . H/O hiatal hernia   . Gout   . Anxiety   . Allergy   . Stomach cancer ([place]) 07/23/2015    invasive adenocarcinoma ,esophagus ,distal   . Hyperlipidemia   . PAD (peripheral artery disease) ([place])   . CHF (congestive heart failure) ([place])   . A-fib ([place])  a. 11/09/2015: successful DCCV  . S/P radiation therapy 08/12/15-09/24/15    Ge  junction 54 Gy  . Blood transfusion without reported diagnosis   . Type II diabetes mellitus ([facility])     no longer on insulin  . Shortness of breath dyspnea     "has been sob all my life"  . OSA on CPAP     not  using  . Sleep apnea   . Anemia   . History of blood transfusion     Past Surgical History: Past Surgical History  Procedure Laterality Date  . Tonsillectomy    . Cardiac catheterization    . Wrist fracture surgery Right   . Cataract  extraction w/ intraocular lens implant Left   . Cardiac defibrillator placement  1997; ~ 2003  . Coronary artery bypass graft  1997; 2007    "X3" (07/29/2013)  . Nerve, tendon and artery repair Left 09/20/2013    Procedure: IRRIGATION AND DEBRIDEMENT,Exploration and Repair of Ulnar/Digital  Artery Nerve of Left Index finger;  Surgeon: Roseanne Kaufman, MD;  Location: [facility];  Service: Orthopedics;  Laterality: Left;  . Abdominal aortagram N/A 01/30/2013    Procedure: ABDOMINAL Maxcine Ham;  Surgeon: Wellington Hampshire, MD;  Location: [facility] CATH LAB;  Service: Cardiovascular;  Laterality: N/A;  . Cardiac catheterization N/A 07/22/2015    Procedure: Left Heart Cath and Coronary Angiography;  Surgeon: Peter M Martinique, MD;  Location: [facility] CV LAB;  Service: Cardiovascular;  Laterality: N/A;  . Esophagogastroduodenoscopy N/A 07/23/2015    Procedure: ESOPHAGOGASTRODUODENOSCOPY (EGD);  Surgeon: Wonda Horner, MD;  Location: [facility] ENDOSCOPY;  Service: Endoscopy;  Laterality: N/A;  . Tee without cardioversion N/A 10/06/2015    Procedure: TRANSESOPHAGEAL ECHOCARDIOGRAM (TEE);  Surgeon: Pixie Casino, MD;  Location[facility] ENDOSCOPY;  Service: Cardiovascular;  Laterality: N/A;  . Cardioversion N/A 11/09/2015    Procedure: CARDIOVERSION;  Surgeon: Pixie Casino, MD;  Location: [facility] ENDOSCOPY;  Service: Cardiovascular;  Laterality: N/A;  . Esophagogastroduodenoscopy N/A 02/18/2016    Procedure: ESOPHAGOGASTRODUODENOSCOPY (EGD);  Surgeon: Ronald Lobo, MD;  Location: [facility] ENDOSCOPY;  Service: Endoscopy;  Laterality: N/A;  . Esophagogastroduodenoscopy (egd) with propofol N/A 04/05/2016    Procedure: ESOPHAGOGASTRODUODENOSCOPY (EGD) WITH PROPOFOL;  Surgeon: Clarene Essex, MD;  Location: [facility] ENDOSCOPY;  Service: Endoscopy;  Laterality: N/A;  . Hot hemostasis N/A 04/05/2016    Procedure: HOT HEMOSTASIS (ARGON PLASMA COAGULATION/BICAP);  Surgeon: Clarene Essex, MD;  Location: [facility] ENDOSCOPY;  Service: Endoscopy;  Laterality: N/A;  .  Esophagogastroduodenoscopy (egd) with propofol N/A 04/28/2016    Procedure: ESOPHAGOGASTRODUODENOSCOPY (EGD) WITH PROPOFOL;  Surgeon: Clarene Essex, MD;  Location: [facility] ENDOSCOPY;  Service: Endoscopy;  Laterality: N/A;  . Hot hemostasis N/A 04/28/2016    Procedure: HOT HEMOSTASIS (ARGON PLASMA COAGULATION/BICAP);  Surgeon: Clarene Essex, MD;  Location: [facility] ENDOSCOPY;  Service: Endoscopy;  Laterality: N/A;    Social History:  Social History   Social History  . Marital Status: Single    Spouse Name: N/A  . Number of Children: 0  . Years of Education: N/A   Occupational History  . retired Surveyor, mining   .     Social History Main Topics  . Smoking status: Former Smoker -- 2.00 packs/day for 30 years    Types: Cigarettes    Quit date: 12/13/1995  . Smokeless tobacco: Never Used  . Alcohol Use: No     Comment: 07/29/2013 "not touched any alcohol since 1997"  . Drug Use: No  . Sexual Activity: Not Currently  Other Topics Concern  . Not on file   Social History Narrative   Single, never married   Retired Administrator   Close friend, [name] and her children are his main support system   Independent in ADLs.  The patient resides in [place].  Family History: Family History  Problem Relation Age of Onset  . Heart disease Brother   . Cancer Brother   . Breast cancer Mother     ALLERGIES:  is allergic to adhesive; levaquin; and morphine and related.  Meds: Current Outpatient Prescriptions  Medication Sig Dispense Refill  . allopurinol (ZYLOPRIM) 300 MG tablet Take 300 mg by mouth daily.      [name] atorvastatin (LIPITOR) 40 MG tablet Take 1 tablet (40 mg total) by mouth daily. 90 tablet 3  . carvedilol (COREG) 6.25 MG tablet take 1 tablet by mouth twice a day with food 60 tablet 3  . digoxin (LANOXIN) 0.125 MG tablet Take 1 tablet (0.125 mg total) by mouth daily. 30 tablet 6  . ferrous sulfate 325 (65 FE) MG EC tablet Take 325 mg daily (Patient taking differently: Take 325  mg by mouth daily with breakfast. Take 325 mg daily) 30 tablet 6  . furosemide (LASIX) 40 MG tablet Take 1 tablet (40 mg total) by mouth daily. 30 tablet 6  . HYDROcodone-acetaminophen (NORCO/VICODIN) 5-325 MG tablet Take 1 tablet by mouth every 6 (six) hours as needed for moderate pain.   0  . levothyroxine (SYNTHROID, LEVOTHROID) 75 MCG tablet Take 75 mcg by mouth daily before breakfast.   0  . lisinopril (PRINIVIL,ZESTRIL) 5 MG tablet Take 1 tablet (5 mg total) by mouth daily. 30 tablet 6  . nitroGLYCERIN (NITROSTAT) 0.4 MG SL tablet Place 1 tablet (0.4 mg total) under the tongue every 5 (five) minutes x 3 doses as needed for chest pain. 25 tablet 3  . pantoprazole (PROTONIX) 40 MG tablet Take 1 tablet (40 mg total) by mouth 2 (two) times daily. 60 tablet 5  . potassium chloride SA (K-DUR,KLOR-CON) 20 MEQ tablet Take 1 tablet (20 mEq total) by mouth daily. 30 tablet 11  . RaNITidine HCl (ZANTAC PO) Take by mouth daily.     No current facility-administered medications for this encounter.    Physical Findings:  height is 6' (1.829 m) and weight is 199 lb 9.6 oz (90.538 kg). His temperature is 97.6 F (36.4 C). His blood pressure is 115/62 and his pulse is 87. His respiration is 16.   In general this is a well appearing Caucasian male in no acute distress. He is alert and oriented x4 and appropriate throughout the examination. HEENT reveals that the patient is normocephalic, atraumatic. EOMs are intact. PERRLA. Skin is intact without any evidence of gross lesions. Cardiovascular exam reveals a regular rate and rhythm, no clicks rubs or murmurs are auscultated. Chest is clear to auscultation bilaterally. Lymphatic assessment is performed and does not reveal any adenopathy in the cervical, supraclavicular, axillary, or inguinal chains. Abdomen has active bowel sounds in all quadrants and is intact. The abdomen is soft, non tender, non distended. Lower extremities are negative for pretibial pitting  edema, deep calf tenderness, cyanosis or clubbing.   Lab Findings: Lab Results  Component Value Date   WBC 4.0 05/04/2016   HGB 9.6* 05/04/2016   HCT 30.6* 05/04/2016   MCV 90.0 05/04/2016   PLT 120* 05/04/2016     Radiographic Findings: No results found.  Impression/Plan: 1. At least stage II A adenocarcinoma of  the GE junction with signet ring features. The patient was without disease on his most recent endoscopy. We will plan to see him back in 6 months time and stagger his follow ups with Dr. Benay Spice. 2. 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 [name] hgb is stable since his 2 units of packed RBCs. He will be seen next week by medical oncology and I will place an order for him to have a repeat CBC with diff prior to his visit with Ned Card, NP.  The above documentation reflects my direct findings during this shared patient visit. Please see the separate note by Dr. Lisbeth Renshaw on this date for the remainder of the patient's plan of care.    Carola Rhine, PAC

## 2016-05-04 NOTE — Progress Notes (Signed)
Pt here for lab appt, received message via walk-in-policy from patient wanting to talk to Dr. Benay Spice "about a biopsy."  Spoke with patient in the lobby and he is concerned about a "spot on his liver and an enlarged lymph node" that Dr. Benay Spice informed him of at his last appointment.  Per Dr. Benay Spice, pt does not need biopsy at this time.  Pt informed of MD instructions and is appreciative of MD response.  Pt.'s HGB is 9.6 today and pt is without complaints at this time.  No need for blood transfusion at this time.  Pt verbalizes an understanding to return on 05/12/16 for visit with L. Marcello Moores NP.  Pt instructed to call [facility] if any further questions or concerns arise.

## 2016-05-04 NOTE — Telephone Encounter (Signed)
Added lab per pof

## 2016-05-04 NOTE — Progress Notes (Addendum)
Mr. Fish returns for assessment S/P XRT to the GE junction which completed on 09/24/15.  He denies any pain on swallowing but notes, "if I eat real fast", it feel like food is stuck in his esophagus. [name] He states he lost 68 lb during his treatment and no longer takes diabetic medication.  He states he is eating less than his norm.  He admits to tiring easily, but does not nap during the day.  BP 115/62 mmHg  Pulse 87  Temp(Src) 97.6 F (36.4 C)  Resp 16  Ht 6' (1.829 m)  Wt 199 lb 9.6 oz (90.538 kg)  BMI 27.06 kg/m2   Wt Readings from Last 3 Encounters:  05/04/16 199 lb 9.6 oz (90.538 kg)  04/25/16 203 lb 9.6 oz (92.352 kg)  04/06/16 206 lb 14.4 oz (93.849 kg)

## 2016-05-12 ENCOUNTER — Ambulatory Visit (INDEPENDENT_AMBULATORY_CARE_PROVIDER_SITE_OTHER)
Admission: RE | Admit: 2016-05-12 | Discharge: 2016-05-12 | Disposition: A | Discharge status: Home / Self Care | Payer: PPO | Source: Ambulatory Visit | Attending: Internal Medicine | Admitting: Internal Medicine

## 2016-05-12 ENCOUNTER — Encounter: Payer: Self-pay | Admitting: Internal Medicine

## 2016-05-12 ENCOUNTER — Ambulatory Visit: Payer: PPO | Attending: Radiation Oncology

## 2016-05-12 ENCOUNTER — Ambulatory Visit (INDEPENDENT_AMBULATORY_CARE_PROVIDER_SITE_OTHER): Payer: PPO | Admitting: Internal Medicine

## 2016-05-12 ENCOUNTER — Inpatient Hospital Stay: Admission: RE | Admit: 2016-05-12 | Payer: PPO | Source: Ambulatory Visit

## 2016-05-12 ENCOUNTER — Ambulatory Visit: Payer: PPO

## 2016-05-12 ENCOUNTER — Ambulatory Visit (HOSPITAL_BASED_OUTPATIENT_CLINIC_OR_DEPARTMENT_OTHER): Payer: PPO | Admitting: Nurse Practitioner

## 2016-05-12 ENCOUNTER — Telehealth: Payer: Self-pay | Admitting: Nurse Practitioner

## 2016-05-12 ENCOUNTER — Other Ambulatory Visit (HOSPITAL_BASED_OUTPATIENT_CLINIC_OR_DEPARTMENT_OTHER): Payer: PPO

## 2016-05-12 ENCOUNTER — Ambulatory Visit: Payer: PPO | Admitting: Nurse Practitioner

## 2016-05-12 VITALS — BP 123/51 | HR 84 | Temp 97.7°F | Resp 16 | Ht 72.0 in | Wt 202.3 lb

## 2016-05-12 VITALS — BP 120/60 | HR 86 | Ht 72.0 in | Wt 200.0 lb

## 2016-05-12 DIAGNOSIS — E119 Type 2 diabetes mellitus without complications: Secondary | ICD-10-CM

## 2016-05-12 DIAGNOSIS — R599 Enlarged lymph nodes, unspecified: Secondary | ICD-10-CM

## 2016-05-12 DIAGNOSIS — T462X5A Adverse effect of other antidysrhythmic drugs, initial encounter: Secondary | ICD-10-CM

## 2016-05-12 DIAGNOSIS — J984 Other disorders of lung: Secondary | ICD-10-CM | POA: Diagnosis not present

## 2016-05-12 DIAGNOSIS — C160 Malignant neoplasm of cardia: Secondary | ICD-10-CM

## 2016-05-12 DIAGNOSIS — K769 Liver disease, unspecified: Secondary | ICD-10-CM | POA: Diagnosis not present

## 2016-05-12 DIAGNOSIS — D638 Anemia in other chronic diseases classified elsewhere: Secondary | ICD-10-CM

## 2016-05-12 DIAGNOSIS — R0781 Pleurodynia: Secondary | ICD-10-CM

## 2016-05-12 DIAGNOSIS — J90 Pleural effusion, not elsewhere classified: Secondary | ICD-10-CM | POA: Diagnosis not present

## 2016-05-12 DIAGNOSIS — D649 Anemia, unspecified: Secondary | ICD-10-CM | POA: Diagnosis not present

## 2016-05-12 LAB — CBC WITH DIFFERENTIAL/PLATELET
BASO%: 0.9 % (ref 0.0–2.0)
BASOS ABS: 0 10*3/uL (ref 0.0–0.1)
EOS ABS: 0.1 10*3/uL (ref 0.0–0.5)
EOS%: 2.4 % (ref 0.0–7.0)
HCT: 29.5 % — ABNORMAL LOW (ref 38.4–49.9)
HGB: 9.3 g/dL — ABNORMAL LOW (ref 13.0–17.1)
LYMPH%: 16.5 % (ref 14.0–49.0)
MCH: 27.7 pg (ref 27.2–33.4)
MCHC: 31.4 g/dL — ABNORMAL LOW (ref 32.0–36.0)
MCV: 88.2 fL (ref 79.3–98.0)
MONO#: 0.5 10*3/uL (ref 0.1–0.9)
MONO%: 9.6 % (ref 0.0–14.0)
NEUT#: 3.3 10*3/uL (ref 1.5–6.5)
NEUT%: 70.6 % (ref 39.0–75.0)
PLATELETS: 125 10*3/uL — AB (ref 140–400)
RBC: 3.34 10*6/uL — AB (ref 4.20–5.82)
RDW: 16.5 % — ABNORMAL HIGH (ref 11.0–14.6)
WBC: 4.7 10*3/uL (ref 4.0–10.3)
lymph#: 0.8 10*3/uL — ABNORMAL LOW (ref 0.9–3.3)

## 2016-05-12 NOTE — Telephone Encounter (Signed)
per pof to sch pt appt-gave pt copy of avs °

## 2016-05-12 NOTE — Progress Notes (Signed)
[facility] OFFICE PROGRESS NOTE   Diagnosis:  Gastroesophageal carcinoma, anemia  INTERVAL HISTORY:   Walter French returns as scheduled. No black stools. No change in baseline dyspnea. Main complaint today is a 2 to three-month history of pain at both "rib cage bases".  Objective:  Vital signs in last 24 hours:  Blood pressure 123/51, pulse 84, temperature 97.7 F (36.5 C), temperature source Oral, resp. rate 16, height 6' (1.829 m), weight 202 lb 4.8 oz (91.763 kg), SpO2 99 %.    HEENT: No thrush or ulcers. Lymphatics: No palpable cervical or supra clavicular lymph nodes. Resp: Faint rales at both lung bases. No respiratory distress. Cardio: Regular rate and rhythm. GI: Abdomen soft and nontender. No hepatomegaly. Vascular: No leg edema. Musculoskeletal: Tender right lower anterolateral chest/ribs.   Lab Results:  Lab Results  Component Value Date   WBC 4.7 05/12/2016   HGB 9.3* 05/12/2016   HCT 29.5* 05/12/2016   MCV 88.2 05/12/2016   PLT 125* 05/12/2016   NEUTROABS 3.3 05/12/2016    Imaging:  No results found.  Medications: I have reviewed the patient's current medications.  Assessment/Plan: 1. Adenocarcinoma the gastroesophageal junction, status post an endoscopic biopsy of a distal esophagus mass and gastric cardia ulcer on 07/23/2015  Staging CT scans consistent with locally metastatic adenopathy  PET scan 08/07/2015 with hypermetabolic mass at the distal esophagus/GE junction and a hypermetabolic paraesophageal, gastrohepatic, and portacaval lymph nodes  Initiation of radiation 08/12/2015 with completion 09/24/2015; cycle 1 Taxol/carboplatin 08/13/2015; Final cycle given 09/22/2015.  CT and a/pelvis 04/01/2016 with improvement of the esophageal tumor, decreased paraesophageal lymph nodes, abnormal wall thickening of the gastric antrum, new bilobed mass between the aorta and vena cava, ill-defined low-attenuation segment 5 hepatic  lesion 2. History of coronary artery disease 3. Congestive heart failure 4. History of ventricular tachycardia and supraventricular tachycardia 5. Peripheral arterial disease 6. Implantable defibrillator 7. Diabetes 8. Gout 9. Hospitalization from 09/10/15 to 09/15/15 due to Acute Hypoxic Respiratory Failure and Sepsis as well as new onset of Atrial Fibrillation, 10. Hospitalization 09/30/2015 through 10/02/2015 with probable UTI/bronchitis. 11. Hospitalization 10/05/2015 with rapid atrial fibrillation 12. 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 Hospitalization with GI bleed 02/17/2016 through 02/22/2016 status post upper endoscopy 02/18/2016 with the source of bleeding felt to be hemorrhagic gastritis. There was no evidence of residual mass in the distal esophagus. He continues twice daily Protonix. 14. Anemia most likely due to gastrointestinal bleeding. He is followed by GI.   Upper endoscopy 04/05/2016 showed nonbleeding esophageal and proximal gastric ulcers. Localized hemorrhagic mucosa with bleeding and stigmata of recent bleeding found in the cardia, gastric fundus, greater curvature of the stomach and lesser curvature of the stomach. Fulguration was performed.  Upper endoscopy 04/28/2016 showed a small hiatal hernia; one distal linear esophageal ulcer with no bleeding and no stigmata of recent bleeding; multiple stigmata of recent bleeding angiodysplastic lesions or telangiectasias from radiation found in the cardia, gastric fundus, greater curvature of the stomach, lesser curvature of the stomach and at the incisura; one nonbleeding cratered gastric ulcer found in the cardia.   Disposition: Mr. Provencher appears unchanged. Hemoglobin is stable. We will repeat a CBC in 2 weeks and 4 weeks.  With regard to the indeterminate liver lesion and enlarged aortocaval node identified on CT 04/01/2016, the plan is to obtain repeat imaging at  a three-month interval.  He will return for a follow-up visit in 4 weeks. He will  contact the office in the interim with any problems. We specifically discussed signs/symptoms suggestive of progressive anemia.  Plan reviewed with Dr. Benay Spice.    Ned Card ANP/GNP-BC   05/12/2016  12:11 PM

## 2016-05-12 NOTE — Progress Notes (Signed)
Subjective:    Patient ID: Walter French, male    DOB: [birth year],    MRN: [id number]    Brief patient profile:  38 yowm quit smoking in 1997 p cabg did better p rehab x a few years and then gradually decline fxn level due to sob ? chf  then much worse since  dx es ca  chemo / RT but no 02 until admit hosp with acute worsening sob with resp failure/ ? Amiodarone toxicity    Admit date: 11/07/2015 Discharge date: 11/16/2015  Recommendations for Outpatient Follow-up:  1. Discharge to home with home health services, PT/OT/RN 2. F/u in one week in the heart failure clinic. Repeat BMP at next visit 3. F/u with pulmonology ASAP for new patient appointment regarding amiodarone toxicity 4. 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[place]) Active Problems:  Hyperlipidemia  Coronary artery disease-status post CABG  Automatic implantable cardioverter-defibrillator in situ  Systolic and diastolic CHF, chronic (HCC)  Gastroesophageal cancer ([place])  PAF- s/p DCCV this admission  Anemia of chronic disease  Hypothyroidism  Acute on chronic combined systolic (congestive) and diastolic (congestive) heart failure (HCC)  Chronic anticoagulation - Eliquis, CHADS2VASC=5  Hypotension  Cardiomyopathy, ischemic-30-35%  Malnutrition of moderate degree  Constipation  Adenocarcinoma of esophagus (HCC)   Wt Readings from Last 3 Encounters:  11/16/15 81.589 kg (179 lb 13.9 oz)  10/30/15 93.033 kg (205 lb 1.6 oz)  10/28/15 93.441 kg (206 lb)    History of present illness:   70 y/o male admitted 10/05/15 with Afib RVR CHad2Vasc2 score=5, Failed DCCV and intolerant BB and therefore on amiodarone and digoxin, CAd s/p CABG x3, chronic systolic heart failure with EF 30-35% 07/2015, H/o VT s/p [employer] ICD, adenoCa Ge-Junction 07/2015 s/p XRT and Taxol/carbioplain completing Rx 09/2015. He  was admitted due to hypoglycemia due to poor PO intake despite being taken off his insulin and being started on glucotrol.  Hospital Course:   Acute hypoxic respiratory failure due to acute on chronic systolic heart failure, EF 30-35% possible atypical pneumonia and amiodarone pulmonary toxicity. He received 7-days of XRT 2 months ago with carboplatin and paclitaxel, however, although XRT may have effected lung, the chemotherapeutic agents are not typical causes of pneumonitis. He was diuresed with Lasix 40 mg IV twice a day and his weight decreased from 86.6 kg to 81.6 kg on the date of discharge. Despite diuresis, he continued to have a persistent high oxygen requirement of 4-5 L. A CT of the chest demonstrated changes consistent with either atypical pneumonia or amiodarone toxicity. He was started on doxycycline for atypical pneumonia on 12/3 and had a small improvement in his breathing over the subsequent 2 days. Despite his improvement on doxycycline, I suspect that he likely has amiodarone toxicity. His ESR was 125. He was not started on steroids secondary to his acute on chronic systolic heart failure. He required 3 L of oxygen at the time of discharge.  Atrial fibrillation s/p successful DCCV on 11/09/2015. He was continued on Apixaban due to [place] score= 5, however his amiodarone was discontinued secondary to pulmonary toxicity. Cardiology recommended Tikosyn should his tachycardia recur.   Abdominal pain likely secondary to constipation and resolved with bowel regimen  Hypoglycemia due to malignancy associated weight loss. His hemoglobin A1c was 5.6 he met with the nutritionist and was encouraged to use supplements. His cortisol level was 22 wnl. TSH was 0.588 wnl.  Hypothyroidism, stable, continue  levothyroxine 44mg daily and repeat TSH in late December.   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 Completed treatment 09/2015 with Taxol/carboplatin and  XRT and CT scan demonstrated improvement in area at GE junction during this hospitalization.   HLD, stable, continue atorvastatin 488mdaily  Gout stable, continue allopurinol 30079maily  Hypokalemia due to lasix. Continued daily potassium  Hyponatremia due to heart failure, stable.  Generalized weakness. PT/OT recommending SNF, however, patient declined and requested to go home. He will go home with 24 hour supervision and home health services.   Consultants:   Cardiology  Diabetic educator  Procedures:  DCCV on 11/28  CT chest on 12/3  Antibiotics:  none        11/20/2015 [facility]st [facility] office visit/ Wert   Chief Complaint  Patient presents with  . Pulmonary Consult    Former patient of Dr. SooHalford Chessmant c/o SOB "all my life" worse recently since started on Amiodarone approx 2 months ago. Med was stopped on 11/14/15.   He states he is fine at rest, but any exertion makes him feel SOB- such as just walking from room to room at home. He also c/o chest tightness and non prod cough.   d/c on 3lpm ok room to room and no worse since d/c   And min  assoc cough / still some dysphagia  Was d/c on pred but tapered off. On amio since 10/10/15 but d/c 11/26/15  rec Please schedule a follow up office visit in 4 weeks, sooner if needed with cxr on return     12/22/2015  f/u ov/Wert re: amio assoc ILD / hypoxemia > pt stopped 02 on his own / no resp rx at all at this point  Chief Complaint  Patient presents with  . Follow-up    Pt states that his breathing is much improved. He has had minimal cough and chest tightness. Cough is prod with minimal white sputum.    no longer using any 02 / Not limited by breathing from desired activities   rec No change rx   02/10/2016  f/u ov/Wert re: pf/ ? All amio related off since 11/26/15  Chief Complaint  Patient presents with  . Follow-up    PFT done today. He states his breathing is unchanged. He is no longer coughing and denies  any chest tightness.   denies  limited by breathing from desired activities  - " I've always breathed like this " rec No change rx   05/12/2016  f/u ov/Wert re: f/u ? Amiodarone toxicity- off x 6 m Doe = MMRC1 = can walk nl pace, flat grade, can't hurry or go uphills or steps s sob       No obvious day to day or daytime variability or assoc excess/ purulent sputum or mucus plugs   or cp or chest tightness, subjective wheeze or overt sinus or hb symptoms. No unusual exp hx or h/o childhood pna/ asthma or knowledge of premature birth.  Sleeping ok without nocturnal  or early am exacerbation  of respiratory  c/o's or need for noct saba. Also denies any obvious fluctuation of symptoms with weather or environmental changes or other aggravating or alleviating factors except as outlined above   Current Medications, Allergies, Complete Past Medical History, Past Surgical History, Family History, and Social History were reviewed in [employer].  ROS  The following are not active complaints unless bolded sore throat, dysphagia, dental problems, itching, sneezing,  nasal congestion or excess/ purulent secretions, ear ache,   fever, chills, sweats, unintended wt loss, classically pleuritic or exertional cp, hemoptysis,  orthopnea pnd or leg swelling, presyncope, palpitations, abdominal pain, anorexia, nausea, vomiting, diarrhea  or change in bowel or bladder habits, change in stools or urine, dysuria,hematuria,  rash, arthralgias, visual complaints, headache, numbness, weakness or ataxia or problems with walking or coordination,  change in mood/affect or memory.             Objective:   Physical Exam  Pleasant amb wm  nad   02/10/2016          204  > 05/12/2016   201    12/22/2015      192   11/20/15 185 lb (83.915 kg)  11/16/15 179 lb 13.9 oz (81.589 kg)  10/30/15 205 lb 1.6 oz (93.033 kg)    Vital signs reviewed   HEENT: nl   turbinates, and oropharynx. Nl external ear  canals without cough reflex - edentulous / pos R esotropic strabismus    NECK :  without JVD/Nodes/TM/ nl carotid upstrokes bilaterally   LUNGS: no acc muscle use,   Clear to A and P    CV:  RRR  no s3 or murmur or increase in P2, no edema   ABD:  soft and nontender with nl inspiratory excursion in the supine position. No bruits or organomegaly, bowel sounds nl  MS:    ext warm without deformities, calf tenderness, cyanosis or clubbing No obvious joint restrictions   SKIN: warm and dry without lesions    NEURO:  alert, approp, nl sensorium with  no motor deficits        I personally reviewed images and agree with radiology impression as follows:  CT Chest   11/14/15 1. Interstitial and ground-glass airspace opacities in the right more than left lungs. Often this is from asymmetric/resolving edema or atypical infection, but in this case there is beaded appearance of right lower lobe bronchi suggesting a fibrotic process and inflammatory pneumonia. Question drug toxicity (especially from the patient's amiodarone use). 2. Treated esophageal cancer with stable lower esophageal thickening. Periesophageal nodal size is decreased from September 2016.    CXR PA and Lateral:   05/12/2016 :    I personally reviewed images and agree with radiology impression as follows:    1. Prior CABG. Cardiac pacer stable position. Stable cardiomegaly.  2. Left base atelectasis. Bilateral pleural effusions, left side greater than right. Similar findings noted on prior exam.     Lab Results  Component Value Date   ESRSEDRATE 54* 02/10/2016   ESRSEDRATE 85* 12/22/2015   ESRSEDRATE 125* 11/16/2015       Assessment & Plan:

## 2016-05-12 NOTE — Addendum Note (Signed)
Encounter addended by: Benn Moulder, RN on: 05/12/2016 11:12 AM<BR>     Documentation filed: Charges VN

## 2016-05-12 NOTE — Patient Instructions (Addendum)
Please remember to go to the   x-ray department downstairs for your tests - we will call you with the results when they are available.   If you are satisfied with your treatment plan,  let your doctor know and he/she can either refill your medications or you can return here when your prescription runs out.     If in any way you are not 100% satisfied,  please tell us.  If 100% better, tell your friends!  Pulmonary follow up is as needed        

## 2016-05-13 NOTE — Progress Notes (Signed)
Quick Note:  Spoke with pt and notified of results per Dr. Wert. Pt verbalized understanding and denied any questions.  ______ 

## 2016-05-16 NOTE — Assessment & Plan Note (Signed)
Amiodarone rx 10/10/15 - 11/16/15 with ESR up to 125 on 11/16/15 and down to 85 12/22/2015 s rx with steroids  - 12/22/2015  Walked RA x 3 laps @ 185 ft each stopped due to End of study, nl pace, no sob or desat  > d/c all 02  - PFT's  02/10/2016  VC 2.81 57% s any sign airflow obst with DLCO  29/36  % corrects to 63  % for alv volume    Clearly improved off amio correlating with resolution also of 02 dep respiratory failure and no further pulmonary f/u is needed

## 2016-05-26 ENCOUNTER — Other Ambulatory Visit (HOSPITAL_BASED_OUTPATIENT_CLINIC_OR_DEPARTMENT_OTHER): Payer: PPO

## 2016-05-26 ENCOUNTER — Telehealth: Payer: Self-pay | Admitting: *Deleted

## 2016-05-26 DIAGNOSIS — C160 Malignant neoplasm of cardia: Secondary | ICD-10-CM

## 2016-05-26 LAB — CBC WITH DIFFERENTIAL/PLATELET
BASO%: 0.8 % (ref 0.0–2.0)
Basophils Absolute: 0 10*3/uL (ref 0.0–0.1)
EOS ABS: 0.1 10*3/uL (ref 0.0–0.5)
EOS%: 2.8 % (ref 0.0–7.0)
HEMATOCRIT: 27.8 % — AB (ref 38.4–49.9)
HEMOGLOBIN: 8.6 g/dL — AB (ref 13.0–17.1)
LYMPH%: 15.7 % (ref 14.0–49.0)
MCH: 27.5 pg (ref 27.2–33.4)
MCHC: 31.1 g/dL — ABNORMAL LOW (ref 32.0–36.0)
MCV: 88.5 fL (ref 79.3–98.0)
MONO#: 0.4 10*3/uL (ref 0.1–0.9)
MONO%: 11.3 % (ref 0.0–14.0)
NEUT%: 69.4 % (ref 39.0–75.0)
NEUTROS ABS: 2.7 10*3/uL (ref 1.5–6.5)
Platelets: 116 10*3/uL — ABNORMAL LOW (ref 140–400)
RBC: 3.14 10*6/uL — ABNORMAL LOW (ref 4.20–5.82)
RDW: 16.9 % — ABNORMAL HIGH (ref 11.0–14.6)
WBC: 3.9 10*3/uL — ABNORMAL LOW (ref 4.0–10.3)
lymph#: 0.6 10*3/uL — ABNORMAL LOW (ref 0.9–3.3)

## 2016-05-26 NOTE — Telephone Encounter (Signed)
Late entry for 0840: Spoke with pt in lobby, he reports no change in dyspnea. Denies need for blood transfusion. Instructed him to call office for weakness, increased shortness of breath or chest pain. He voiced understanding. Will repeat lab as scheduled, 6/29.

## 2016-06-01 ENCOUNTER — Other Ambulatory Visit: Payer: Self-pay | Admitting: Internal Medicine

## 2016-06-06 ENCOUNTER — Encounter: Payer: Self-pay | Admitting: Cardiology

## 2016-06-06 ENCOUNTER — Ambulatory Visit (INDEPENDENT_AMBULATORY_CARE_PROVIDER_SITE_OTHER): Payer: PPO | Admitting: Cardiology

## 2016-06-06 VITALS — BP 108/66 | HR 96 | Ht 72.0 in | Wt 202.4 lb

## 2016-06-06 DIAGNOSIS — D500 Iron deficiency anemia secondary to blood loss (chronic): Secondary | ICD-10-CM

## 2016-06-06 DIAGNOSIS — E785 Hyperlipidemia, unspecified: Secondary | ICD-10-CM

## 2016-06-06 DIAGNOSIS — I2510 Atherosclerotic heart disease of native coronary artery without angina pectoris: Secondary | ICD-10-CM | POA: Diagnosis not present

## 2016-06-06 DIAGNOSIS — C159 Malignant neoplasm of esophagus, unspecified: Secondary | ICD-10-CM

## 2016-06-06 DIAGNOSIS — Z95810 Presence of automatic (implantable) cardiac defibrillator: Secondary | ICD-10-CM

## 2016-06-06 DIAGNOSIS — I480 Paroxysmal atrial fibrillation: Secondary | ICD-10-CM

## 2016-06-06 DIAGNOSIS — I5042 Chronic combined systolic (congestive) and diastolic (congestive) heart failure: Secondary | ICD-10-CM

## 2016-06-06 NOTE — Patient Instructions (Signed)
Continue your current therapy  I will see you in 4 months  

## 2016-06-06 NOTE — Progress Notes (Signed)
[name] Date of Birth: 1946/01/20 Medical Record [id number]  History of Present Illness: Mr. Eberle is seen  for follow up of CAD, atrial fibrillation, and CHF. He has known CAD. He presented in 1997 with acute MI and refractory VT/VF with severe 3 vessel CAD. He underwent emergent CABG and had an ICD implanted. In 2008 he presented with recurrent angina and graft failure and had repeat CABG by Dr. Prescott Gum with LIMA to the LAD, and sequential SVG to the PDA and PLOM. He subsequently had  PCI of the anastomosis of the LIMA to the LAD. This was a difficult POBA procedure with modest success. In August 2016 he had his last cardiac cath due to chest pain and abnormal Myoview. This showed all grafts were patent and the LIMA anastomosis appeared improved. EF was 30-35% by Echo.   In the summer of 2016 he was diagnosed with adenoCA of the GE junction. He was treated with radiation and chemotherapy. He was admitted in September 2016 with sepsis and new onset Afib with RVR. He was started on anticoagulation with Eliquis and treated with rate control.   He was readmitted with persistent afib and worsening CHF approximately 3 weeks later. He had DCCV but did not hold NSR. He was loaded with amiodarone. In late November he was admitted with respiratory failure and amiodarone pulmonary toxicity. He did have successful DCCV on 11/09/15 but amiodarone had to be discontinued due to toxicity.   Since March he has had recurrent upper GI bleeds.  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 He was treated with PPI and sulcrafate. His esophageal cancer was no longer evident. His Eliquis was stopped. He has required a total of 18 units of PRBCs.   His other issues include HLD, gout, HTN, VT with ICD in place, OSA, DM and obesity. He has had angiography and stent to the right popliteal artery in Feb. 2014 by Dr. Fletcher Anon. Last evaluation  in February 2016 was stable.  He reports that his  claudication now is really fairly mild.   He currently denies chest pain or edema. He has chronic SOB that has not changed. He has diffuse leg pains when he first gets up that improves when he gets moving.    Current Outpatient Prescriptions  Medication Sig Dispense Refill  . allopurinol (ZYLOPRIM) 300 MG tablet Take 300 mg by mouth daily.      [name] atorvastatin (LIPITOR) 40 MG tablet take 1 tablet by mouth once daily 90 tablet 2  . carvedilol (COREG) 6.25 MG tablet take 1 tablet by mouth twice a day with food 60 tablet 3  . digoxin (LANOXIN) 0.125 MG tablet Take 1 tablet (0.125 mg total) by mouth daily. 30 tablet 6  . ferrous sulfate 325 (65 FE) MG EC tablet Take 325 mg daily (Patient taking differently: Take 325 mg by mouth daily with breakfast. Take 325 mg daily) 30 tablet 6  . furosemide (LASIX) 40 MG tablet Take 1 tablet (40 mg total) by mouth daily. 30 tablet 6  . HYDROcodone-acetaminophen (NORCO/VICODIN) 5-325 MG tablet Take 1 tablet by mouth every 6 (six) hours as needed for moderate pain.   0  . levothyroxine (SYNTHROID, LEVOTHROID) 75 MCG tablet Take 75 mcg by mouth daily before breakfast.   0  . lisinopril (PRINIVIL,ZESTRIL) 5 MG tablet Take 1 tablet (5 mg total) by mouth daily. 30 tablet 6  . nitroGLYCERIN (NITROSTAT) 0.4 MG SL tablet Place 1 tablet (0.4 mg total) under the  tongue every 5 (five) minutes x 3 doses as needed for chest pain. 25 tablet 3  . potassium chloride SA (K-DUR,KLOR-CON) 20 MEQ tablet Take 1 tablet (20 mEq total) by mouth daily. 30 tablet 11  . RaNITidine HCl (ZANTAC PO) Take by mouth daily.     No current facility-administered medications for this visit.    Allergies  Allergen Reactions  . Adhesive [Tape] Other (See Comments)    Pulls skin off / please use paper tape  . Levaquin [Levofloxacin In D5w] Nausea And Vomiting  . Morphine And Related Nausea And Vomiting    Past Medical History  Diagnosis Date  . Hyperlipidemia   . Hypertension   . Coronary  heart disease     a. s/p CABG x 3 (VG->PDA, VG->PL, LIMA->LAD);  b. 2013 Abnl Myoview;  c. 2013 Cath: 3/3 patent grafts, occluded LCX which correlated w/ ischemia on MV->not amenable to PCI->Med Rx.  . Obesity   . CHF (congestive heart failure) ([place])   . VT (ventricular tachycardia) ([place])   . SVT (supraventricular tachycardia) ([place])   . PAD (peripheral artery disease) ([place])     Angiography in February of 2014: Moderate left iliac artery stenosis, mild to moderate diffuse right SFA disease. Severe proximal right popliteal artery stenosis with three-vessel runoff below the knee. Status post self-expanding stent placement to the proximal popliteal artery  . Implantable cardioverter-defibrillator Mdt   . Asthma     "small touch" (07/29/2013)  . History of pneumonia     "3-4 times; last time ~ 2003" (07/29/2013)  . GERD (gastroesophageal reflux disease)   . H/O hiatal hernia   . Gout   . Anxiety   . Allergy   . Stomach cancer ([place]) 07/23/2015    invasive adenocarcinoma ,esophagus ,distal   . Hyperlipidemia   . PAD (peripheral artery disease) ([place])   . CHF (congestive heart failure) ([place])   . A-fib ([place])     a. 11/09/2015: successful DCCV  . S/P radiation therapy 08/12/15-09/24/15    Ge  junction 54 Gy  . Blood transfusion without reported diagnosis   . Type II diabetes mellitus (HCC)     no longer on insulin  . Shortness of breath dyspnea     "has been sob all my life"  . OSA on CPAP     not  using  . Sleep apnea   . Anemia   . History of blood transfusion     Past Surgical History  Procedure Laterality Date  . Tonsillectomy    . Cardiac catheterization    . Wrist fracture surgery Right   . Cataract extraction w/ intraocular lens implant Left   . Cardiac defibrillator placement  1997; ~ 2003  . Coronary artery bypass graft  1997; 2007    "X3" (07/29/2013)  . Nerve, tendon and artery repair Left 09/20/2013    Procedure: IRRIGATION AND DEBRIDEMENT,Exploration and Repair of  Ulnar/Digital  Artery Nerve of Left Index finger;  Surgeon: Roseanne Kaufman, MD;  Location: [facility];  Service: Orthopedics;  Laterality: Left;  . Abdominal aortagram N/A 01/30/2013    Procedure: ABDOMINAL Maxcine Ham;  Surgeon: Wellington Hampshire, MD;  Location[facility] CATH LAB;  Service: Cardiovascular;  Laterality: N/A;  . Cardiac catheterization N/A 07/22/2015    Procedure: Left Heart Cath and Coronary Angiography;  Surgeon: Peter M Martinique, MD;  Location: [place] CV LAB;  Service: Cardiovascular;  Laterality: N/A;  . Esophagogastroduodenoscopy N/A 07/23/2015    Procedure: ESOPHAGOGASTRODUODENOSCOPY (EGD);  Surgeon: Jilda Roche  Hinda Lenis, MD;  Location: [place];  Service: Endoscopy;  Laterality: N/A;  . Tee without cardioversion N/A 10/06/2015    Procedure: TRANSESOPHAGEAL ECHOCARDIOGRAM (TEE);  Surgeon: Pixie Casino, MD;  Location: [facility] ENDOSCOPY;  Service: Cardiovascular;  Laterality: N/A;  . Cardioversion N/A 11/09/2015    Procedure: CARDIOVERSION;  Surgeon: Pixie Casino, MD;  Location: [facility] ENDOSCOPY;  Service: Cardiovascular;  Laterality: N/A;  . Esophagogastroduodenoscopy N/A 02/18/2016    Procedure: ESOPHAGOGASTRODUODENOSCOPY (EGD);  Surgeon: Ronald Lobo, MD;  Location: [facility] ENDOSCOPY;  Service: Endoscopy;  Laterality: N/A;  . Esophagogastroduodenoscopy (egd) with propofol N/A 04/05/2016    Procedure: ESOPHAGOGASTRODUODENOSCOPY (EGD) WITH PROPOFOL;  Surgeon: Clarene Essex, MD;  Location: [facility] ENDOSCOPY;  Service: Endoscopy;  Laterality: N/A;  . Hot hemostasis N/A 04/05/2016    Procedure: HOT HEMOSTASIS (ARGON PLASMA COAGULATION/BICAP);  Surgeon: Clarene Essex, MD;  Location: [facility] ENDOSCOPY;  Service: Endoscopy;  Laterality: N/A;  . Esophagogastroduodenoscopy (egd) with propofol N/A 04/28/2016    Procedure: ESOPHAGOGASTRODUODENOSCOPY (EGD) WITH PROPOFOL;  Surgeon: Clarene Essex, MD;  Location: [facility] ENDOSCOPY;  Service: Endoscopy;  Laterality: N/A;  . Hot hemostasis N/A 04/28/2016    Procedure: HOT HEMOSTASIS (ARGON  PLASMA COAGULATION/BICAP);  Surgeon: Clarene Essex, MD;  Location[facility] ENDOSCOPY;  Service: Endoscopy;  Laterality: N/A;    History  Smoking status  . Former Smoker -- 2.00 packs/day for 30 years  . Types: Cigarettes  . Quit date: 12/13/1995  Smokeless tobacco  . Never Used    History  Alcohol Use No    Comment: 07/29/2013 "not touched any alcohol since 1997"    Family History  Problem Relation Age of Onset  . Heart disease Brother   . Cancer Brother   . Breast cancer Mother     Review of Systems: The review of systems is per the HPI.  All other systems were reviewed and are negative.  Physical Exam: BP 108/66 mmHg  Pulse 96  Ht 6' (1.829 m)  Wt 202 lb 6.4 oz (91.808 kg)  BMI 27.44 kg/m2 Patient is very pleasant and in no acute distress. Skin is warm and dry. Color is normal.  HEENT is unremarkable. Normocephalic/atraumatic. PERRL. Sclera are nonicteric. Neck is supple. No masses. No JVD. Lungs are clear. Cardiac exam shows a regular rate and rhythm. No gallop. No murmur.  Abdomen is soft. Extremities are without edema. Feet are warm and dry. Gait and ROM are intact. No gross neurologic deficits noted.  LABORATORY DATA: Lab Results  Component Value Date   WBC 3.9* 05/26/2016   HGB 8.6* 05/26/2016   HCT 27.8* 05/26/2016   PLT 116* 05/26/2016   GLUCOSE 129* 03/18/2016   CHOL 138 07/30/2013   TRIG 321* 07/30/2013   HDL 25* 07/30/2013   LDLCALC 49 07/30/2013   ALT 8* 03/10/2016   AST 23 03/10/2016   NA 135 03/18/2016   K 3.5 03/18/2016   CL 103 03/18/2016   CREATININE 0.86 03/18/2016   BUN 14 03/18/2016   CO2 22 03/18/2016   TSH 0.588 11/09/2015   INR 1.34 02/17/2016   HGBA1C 5.6 11/12/2015    Assessment / Plan: 1. CAD - will continue with medical management. Last cath in August 2016 showed patent grafts and he is asymptomatic.    2. Atrial fibrillation. S/p DCCV in November 2016. Afib started in setting of sepsis and led to worsening CHF. Intolerant of  amiodarone due to pulmonary toxicity. Fortunately maintaining NSR. Recurrent  UGI bleeds preclude anticoagulation.   3. Chronic systolic CHF.  Ischemic  CM EF 30-35%. Well compensated on current therapy. Continue current lasix. Lisinopril, and Coreg dose.   4. PAD - s/p  popliteal stent. Symptoms improved.  5. VT with underlying ICD - followed by Dr. Caryl Comes.   6. DM - reports good control.  7. Recurrent UGI bleed secondary to radiation induced esophagitis/gastritis. On lifelong PPI and sucralfate per GI. Not a candidate for anticoagulation or anti platelet therapy.  Follow up in 4 months.

## 2016-06-07 DIAGNOSIS — I4891 Unspecified atrial fibrillation: Secondary | ICD-10-CM | POA: Diagnosis not present

## 2016-06-07 DIAGNOSIS — E7800 Pure hypercholesterolemia, unspecified: Secondary | ICD-10-CM | POA: Diagnosis not present

## 2016-06-07 DIAGNOSIS — C160 Malignant neoplasm of cardia: Secondary | ICD-10-CM | POA: Diagnosis not present

## 2016-06-07 DIAGNOSIS — E034 Atrophy of thyroid (acquired): Secondary | ICD-10-CM | POA: Diagnosis not present

## 2016-06-07 DIAGNOSIS — I10 Essential (primary) hypertension: Secondary | ICD-10-CM | POA: Diagnosis not present

## 2016-06-07 DIAGNOSIS — E119 Type 2 diabetes mellitus without complications: Secondary | ICD-10-CM | POA: Diagnosis not present

## 2016-06-07 DIAGNOSIS — Z0000 Encounter for general adult medical examination without abnormal findings: Secondary | ICD-10-CM | POA: Diagnosis not present

## 2016-06-07 DIAGNOSIS — I2510 Atherosclerotic heart disease of native coronary artery without angina pectoris: Secondary | ICD-10-CM | POA: Diagnosis not present

## 2016-06-07 DIAGNOSIS — I5042 Chronic combined systolic (congestive) and diastolic (congestive) heart failure: Secondary | ICD-10-CM | POA: Diagnosis not present

## 2016-06-07 DIAGNOSIS — E039 Hypothyroidism, unspecified: Secondary | ICD-10-CM | POA: Diagnosis not present

## 2016-06-09 ENCOUNTER — Telehealth: Payer: Self-pay | Admitting: Oncology

## 2016-06-09 ENCOUNTER — Other Ambulatory Visit (HOSPITAL_BASED_OUTPATIENT_CLINIC_OR_DEPARTMENT_OTHER): Payer: PPO

## 2016-06-09 ENCOUNTER — Telehealth: Payer: Self-pay | Admitting: *Deleted

## 2016-06-09 ENCOUNTER — Ambulatory Visit (HOSPITAL_BASED_OUTPATIENT_CLINIC_OR_DEPARTMENT_OTHER): Payer: PPO | Admitting: Oncology

## 2016-06-09 VITALS — BP 141/67 | HR 76 | Temp 97.6°F | Resp 18 | Ht 72.0 in | Wt 203.4 lb

## 2016-06-09 DIAGNOSIS — R300 Dysuria: Secondary | ICD-10-CM

## 2016-06-09 DIAGNOSIS — C160 Malignant neoplasm of cardia: Secondary | ICD-10-CM

## 2016-06-09 DIAGNOSIS — D638 Anemia in other chronic diseases classified elsewhere: Secondary | ICD-10-CM

## 2016-06-09 DIAGNOSIS — D649 Anemia, unspecified: Secondary | ICD-10-CM

## 2016-06-09 DIAGNOSIS — D500 Iron deficiency anemia secondary to blood loss (chronic): Secondary | ICD-10-CM

## 2016-06-09 DIAGNOSIS — D696 Thrombocytopenia, unspecified: Secondary | ICD-10-CM | POA: Diagnosis not present

## 2016-06-09 DIAGNOSIS — R351 Nocturia: Secondary | ICD-10-CM | POA: Diagnosis not present

## 2016-06-09 LAB — URINALYSIS, MICROSCOPIC - CHCC
BILIRUBIN (URINE): NEGATIVE
BLOOD: NEGATIVE
GLUCOSE UR CHCC: NEGATIVE mg/dL
KETONES: NEGATIVE mg/dL
Leukocyte Esterase: NEGATIVE
Nitrite: NEGATIVE
PH: 6 (ref 4.6–8.0)
PROTEIN: NEGATIVE mg/dL
RBC / HPF: NEGATIVE (ref 0–2)
SPECIFIC GRAVITY, URINE: 1.025 (ref 1.003–1.035)
Urobilinogen, UR: 0.2 mg/dL (ref 0.2–1)

## 2016-06-09 LAB — CBC WITH DIFFERENTIAL/PLATELET
BASO%: 0.6 % (ref 0.0–2.0)
BASOS ABS: 0 10*3/uL (ref 0.0–0.1)
EOS ABS: 0.1 10*3/uL (ref 0.0–0.5)
EOS%: 2.7 % (ref 0.0–7.0)
HCT: 30.8 % — ABNORMAL LOW (ref 38.4–49.9)
HGB: 9.3 g/dL — ABNORMAL LOW (ref 13.0–17.1)
LYMPH%: 17.6 % (ref 14.0–49.0)
MCH: 27 pg — AB (ref 27.2–33.4)
MCHC: 30.2 g/dL — AB (ref 32.0–36.0)
MCV: 89.5 fL (ref 79.3–98.0)
MONO#: 0.5 10*3/uL (ref 0.1–0.9)
MONO%: 13.7 % (ref 0.0–14.0)
NEUT%: 65.4 % (ref 39.0–75.0)
NEUTROS ABS: 2.2 10*3/uL (ref 1.5–6.5)
PLATELETS: 111 10*3/uL — AB (ref 140–400)
RBC: 3.44 10*6/uL — AB (ref 4.20–5.82)
RDW: 15.3 % — ABNORMAL HIGH (ref 11.0–14.6)
WBC: 3.4 10*3/uL — AB (ref 4.0–10.3)
lymph#: 0.6 10*3/uL — ABNORMAL LOW (ref 0.9–3.3)

## 2016-06-09 NOTE — Telephone Encounter (Signed)
Gave and printed appt sched and avs for pt for July  °

## 2016-06-09 NOTE — Telephone Encounter (Signed)
Per Dr. Benay Spice, patient notified that urinalysis is negative and to f/u with his primary MD to evaluate nocturia.  Patient verbalizes an understanding of information and is appreciative of call.

## 2016-06-09 NOTE — Progress Notes (Signed)
Walter French OFFICE PROGRESS NOTE   Diagnosis: Anemia, esophagus cancer  INTERVAL HISTORY:   Walter French returns as scheduled. He feels well. No bleeding. He complains of nocturia and burning with urination.  Objective:  Vital signs in last 24 hours:  Blood pressure 141/67, pulse 76, temperature 97.6 F (36.4 C), temperature source Oral, resp. rate 18, height 6' (1.829 m), weight 203 lb 6.4 oz (92.262 kg), SpO2 100 %.    Lymphatics: No axillary or inguinal nodes Resp: Inspiratory rales at the left posterior base, no respiratory distress Cardio: Regular rate and rhythm GI: No hepatomegaly, nontender, no mass Vascular: Trace pretibial edema at the right greater than left lower leg   Lab Results:  Lab Results  Component Value Date   WBC 3.4* 06/09/2016   HGB 9.3* 06/09/2016   HCT 30.8* 06/09/2016   MCV 89.5 06/09/2016   PLT 111* 06/09/2016   NEUTROABS 2.2 06/09/2016     Lab Results  Component Value Date   CEA1 0.6 04/18/2016    Medications: I have reviewed the patient's current medications.  Assessment/Plan: 1. Adenocarcinoma the gastroesophageal junction, status post an endoscopic biopsy of a distal esophagus mass and gastric cardia ulcer on 07/23/2015  Staging CT scans consistent with locally metastatic adenopathy  PET scan 08/07/2015 with hypermetabolic mass at the distal esophagus/GE junction and a hypermetabolic paraesophageal, gastrohepatic, and portacaval lymph nodes  Initiation of radiation 08/12/2015 with completion 09/24/2015; cycle 1 Taxol/carboplatin 08/13/2015; Final cycle given 09/22/2015.  CT and a/pelvis 04/01/2016 with improvement of the esophageal tumor, decreased paraesophageal lymph nodes, abnormal wall thickening of the gastric antrum, new bilobed mass between the aorta and vena cava, ill-defined low-attenuation segment 5 hepatic lesion 2. History of coronary artery disease 3. Congestive heart failure 4. History of ventricular  tachycardia and supraventricular tachycardia 5. Peripheral arterial disease 6. Implantable defibrillator 7. Diabetes 8. Gout 9. Hospitalization from 09/10/15 to 09/15/15 due to Acute Hypoxic Respiratory Failure and Sepsis as well as new onset of Atrial Fibrillation, 10. Hospitalization 09/30/2015 through 10/02/2015 with probable UTI/bronchitis. 11. Hospitalization 10/05/2015 with rapid atrial fibrillation 12. 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 Hospitalization with GI bleed 02/17/2016 through 02/22/2016 status post upper endoscopy 02/18/2016 with the source of bleeding felt to be hemorrhagic gastritis. There was no evidence of residual mass in the distal esophagus. He continues twice daily Protonix. 14. Anemia most likely due to gastrointestinal bleeding. Improved.  Upper endoscopy 04/05/2016 showed nonbleeding esophageal and proximal gastric ulcers. Localized hemorrhagic mucosa with bleeding and stigmata of recent bleeding found in the cardia, gastric fundus, greater curvature of the stomach and lesser curvature of the stomach. Fulguration was performed.  Upper endoscopy 04/28/2016 showed a small hiatal hernia; one distal linear esophageal ulcer with no bleeding and no stigmata of recent bleeding; multiple stigmata of recent bleeding angiodysplastic lesions or telangiectasias from radiation found in the cardia, gastric fundus, greater curvature of the stomach, lesser curvature of the stomach and at the incisura; one nonbleeding cratered gastric ulcer found in the cardia. 15. Thrombocytopenia   Disposition:  The hemoglobin has stabilized over the past month. He was last transfused with red blood cells on 04/26/2016. He has chronic thrombocytopenia. He may have underlying myelodysplasia, chronic liver disease, or ITP. We will consider a bone marrow biopsy if the platelets fall further.  Walter French will return for an office visit in  one month.  We will plan a repeat CT of the abdomen within the next  2-3 months.  We checked a urinalysis and urine culture today. I recommended he follow-up with his primary physician for evaluation of the nocturia.  Betsy Coder, MD  06/09/2016  8:32 AM

## 2016-06-10 LAB — URINE CULTURE: ORGANISM ID, BACTERIA: NO GROWTH

## 2016-06-23 ENCOUNTER — Encounter: Payer: Self-pay | Admitting: Internal Medicine

## 2016-06-23 ENCOUNTER — Ambulatory Visit (INDEPENDENT_AMBULATORY_CARE_PROVIDER_SITE_OTHER): Payer: PPO | Admitting: Internal Medicine

## 2016-06-23 VITALS — BP 106/60 | HR 81 | Ht 72.0 in | Wt 203.0 lb

## 2016-06-23 DIAGNOSIS — Z95810 Presence of automatic (implantable) cardiac defibrillator: Secondary | ICD-10-CM | POA: Diagnosis not present

## 2016-06-23 DIAGNOSIS — I255 Ischemic cardiomyopathy: Secondary | ICD-10-CM | POA: Diagnosis not present

## 2016-06-23 DIAGNOSIS — I5022 Chronic systolic (congestive) heart failure: Secondary | ICD-10-CM

## 2016-06-23 DIAGNOSIS — I4729 Other ventricular tachycardia: Secondary | ICD-10-CM

## 2016-06-23 DIAGNOSIS — I472 Ventricular tachycardia: Secondary | ICD-10-CM | POA: Diagnosis not present

## 2016-06-23 NOTE — Progress Notes (Signed)
To be moving he     Patient Care Team: Karlene Einstein, MD as PCP - General (Family Medicine)   HPI  Walter French is a 70 y.o. 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 He is status post ICD implantation originally implanted in 1997 and changed out subsequently  He underwent generator replacement aug 2012.   He underwent redo CABG in 2007 and then subsequent angioplasty of the IMA anastomosis to the LAD in 2008. Ejection fraction at that time was 50%.   Most recent Myoview 8/13 demonstrated ejection fraction of 32% with old apical scars and inferolateral ischemia  LHC  8/13  Severe three-vessel obstructive coronary disease.  [name] Patent saphenous vein graft to the PDA.,vein graft to the posterior lateral branch. Patent LIMA graft to the LAD. There is a moderately severe stenosis at the anastomotic site.   In the summer of 2016 he was diagnosed with adenoCA of the GE junction. He was treated with radiation and chemotherapy. He was admitted in September 2016 with sepsis and new onset Afib with RVR. He was started on anticoagulation with Eliquis and treated with rate control.   He was readmitted with persistent afib and worsening CHF approximately 3 weeks later. He had DCCV but did not hold NSR. He was loaded with amiodarone. In late November he was admitted with respiratory failure and amiodarone pulmonary toxicity. He did have successful DCCV on 11/09/15 but amiodarone had to be discontinued due to toxicity.   Since March he has had recurrent upper GI bleeds. 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 He was treated with PPI and sulcrafate. His esophageal cancer was no longer evident. His Eliquis was stopped. He has required a total of 18 units of PRBCs.    With that in the past he has done amazingly well. He has no  complaints right now of shortness of breath or chest pain; there has been no swelling  He does note that his ICD may have been in the fall. No alerts are evident at this time      Past Medical History  Diagnosis Date  . Hyperlipidemia   . Hypertension   . Coronary heart disease     a. s/p CABG x 3 (VG->PDA, VG->PL, LIMA->LAD);  b. 2013 Abnl Myoview;  c. 2013 Cath: 3/3 patent grafts, occluded LCX which correlated w/ ischemia on MV->not amenable to PCI->Med Rx.  . Obesity   . CHF (congestive heart failure) ([place])   . VT (ventricular tachycardia) ([place])   . SVT (supraventricular tachycardia) ([place])   . PAD (peripheral artery disease) ([place])     Angiography in February of 2014: Moderate left iliac artery stenosis, mild to moderate diffuse right SFA disease. Severe proximal right popliteal artery stenosis with three-vessel runoff below the knee. Status post self-expanding stent placement to the proximal popliteal artery  . Implantable cardioverter-defibrillator Mdt   . Asthma     "small touch" (07/29/2013)  . History of pneumonia     "3-4 times; last time ~ 2003" (07/29/2013)  . GERD (gastroesophageal reflux disease)   . H/O hiatal hernia   . Gout   . Anxiety   . Allergy   . Stomach cancer ([place]) 07/23/2015    invasive adenocarcinoma ,esophagus ,distal   . Hyperlipidemia   . PAD (peripheral artery disease) ([place])   . CHF (congestive heart failure) ([place])   . A-fib ([place])  a. 11/09/2015: successful DCCV  . S/P radiation therapy 08/12/15-09/24/15    Ge  junction 54 Gy  . Blood transfusion without reported diagnosis   . Type II diabetes mellitus ([facility])     no longer on insulin  . Shortness of breath dyspnea     "has been sob all my life"  . OSA on CPAP     not  using  . Sleep apnea   . Anemia   . History of blood transfusion     Past Surgical History  Procedure Laterality Date  . Tonsillectomy    . Cardiac catheterization    . Wrist fracture surgery Right   . Cataract extraction  w/ intraocular lens implant Left   . Cardiac defibrillator placement  1997; ~ 2003  . Coronary artery bypass graft  1997; 2007    "X3" (07/29/2013)  . Nerve, tendon and artery repair Left 09/20/2013    Procedure: IRRIGATION AND DEBRIDEMENT,Exploration and Repair of Ulnar/Digital  Artery Nerve of Left Index finger;  Surgeon: Roseanne Kaufman, MD;  Location: [place];  Service: Orthopedics;  Laterality: Left;  . Abdominal aortagram N/A 01/30/2013    Procedure: ABDOMINAL Maxcine Ham;  Surgeon: Wellington Hampshire, MD;  Location: [facility] CATH LAB;  Service: Cardiovascular;  Laterality: N/A;  . Cardiac catheterization N/A 07/22/2015    Procedure: Left Heart Cath and Coronary Angiography;  Surgeon: Peter M Martinique, MD;  Location: [place] CV LAB;  Service: Cardiovascular;  Laterality: N/A;  . Esophagogastroduodenoscopy N/A 07/23/2015    Procedure: ESOPHAGOGASTRODUODENOSCOPY (EGD);  Surgeon: Wonda Horner, MD;  Location: [facility] ENDOSCOPY;  Service: Endoscopy;  Laterality: N/A;  . Tee without cardioversion N/A 10/06/2015    Procedure: TRANSESOPHAGEAL ECHOCARDIOGRAM (TEE);  Surgeon: Pixie Casino, MD;  Location: [facility] ENDOSCOPY;  Service: Cardiovascular;  Laterality: N/A;  . Cardioversion N/A 11/09/2015    Procedure: CARDIOVERSION;  Surgeon: Pixie Casino, MD;  Location: [facility] ENDOSCOPY;  Service: Cardiovascular;  Laterality: N/A;  . Esophagogastroduodenoscopy N/A 02/18/2016    Procedure: ESOPHAGOGASTRODUODENOSCOPY (EGD);  Surgeon: Ronald Lobo, MD;  Location: [facility] ENDOSCOPY;  Service: Endoscopy;  Laterality: N/A;  . Esophagogastroduodenoscopy (egd) with propofol N/A 04/05/2016    Procedure: ESOPHAGOGASTRODUODENOSCOPY (EGD) WITH PROPOFOL;  Surgeon: Clarene Essex, MD;  Location: [facility] ENDOSCOPY;  Service: Endoscopy;  Laterality: N/A;  . Hot hemostasis N/A 04/05/2016    Procedure: HOT HEMOSTASIS (ARGON PLASMA COAGULATION/BICAP);  Surgeon: Clarene Essex, MD;  Location: [facility] ENDOSCOPY;  Service: Endoscopy;  Laterality: N/A;  .  Esophagogastroduodenoscopy (egd) with propofol N/A 04/28/2016    Procedure: ESOPHAGOGASTRODUODENOSCOPY (EGD) WITH PROPOFOL;  Surgeon: Clarene Essex, MD;  Location: [facility] ENDOSCOPY;  Service: Endoscopy;  Laterality: N/A;  . Hot hemostasis N/A 04/28/2016    Procedure: HOT HEMOSTASIS (ARGON PLASMA COAGULATION/BICAP);  Surgeon: Clarene Essex, MD;  Location: [facility] ENDOSCOPY;  Service: Endoscopy;  Laterality: N/A;    Current Outpatient Prescriptions  Medication Sig Dispense Refill  . allopurinol (ZYLOPRIM) 300 MG tablet Take 300 mg by mouth daily.      [name] atorvastatin (LIPITOR) 40 MG tablet take 1 tablet by mouth once daily 90 tablet 2  . carvedilol (COREG) 6.25 MG tablet take 1 tablet by mouth twice a day with food 60 tablet 3  . digoxin (LANOXIN) 0.125 MG tablet Take 1 tablet (0.125 mg total) by mouth daily. 30 tablet 6  . ferrous sulfate 325 (65 FE) MG EC tablet Take 325 mg by mouth daily with breakfast.    . furosemide (LASIX) 40 MG tablet Take 1 tablet (40 mg  total) by mouth daily. 30 tablet 6  . HYDROcodone-acetaminophen (NORCO/VICODIN) 5-325 MG tablet Take 1 tablet by mouth every 6 (six) hours as needed for moderate pain.   0  . levothyroxine (SYNTHROID, LEVOTHROID) 75 MCG tablet Take 75 mcg by mouth daily before breakfast.   0  . lisinopril (PRINIVIL,ZESTRIL) 5 MG tablet Take 1 tablet (5 mg total) by mouth daily. 30 tablet 6  . nitroGLYCERIN (NITROSTAT) 0.4 MG SL tablet Place 1 tablet (0.4 mg total) under the tongue every 5 (five) minutes x 3 doses as needed for chest pain. 25 tablet 3  . potassium chloride SA (K-DUR,KLOR-CON) 20 MEQ tablet Take 1 tablet (20 mEq total) by mouth daily. 30 tablet 11  . RaNITidine HCl (ZANTAC PO) Take 1 tablet by mouth daily.      No current facility-administered medications for this visit.    Allergies  Allergen Reactions  . Adhesive [Tape] Other (See Comments)    Pulls skin off / please use paper tape  . Levaquin [Levofloxacin In D5w] Nausea And Vomiting  . Morphine  And Related Nausea And Vomiting    Review of Systems negative except from HPI and PMH  Physical Exam BP 106/60 mmHg  Pulse 81  Ht 6' (1.829 m)  Wt 203 lb (92.08 kg)  BMI 27.53 kg/m2  SpO2 95% Well developed and well nourished in no acute distress HENT normal E scleral and icterus clear Neck Supple JVP flat; carotids brisk and full Clear to ausculation  Regular rate and rhythm, no murmurs gallops or rub Soft with active bowel sounds No clubbing cyanosis no  Edema Alert and oriented, grossly normal motor and sensory function Skin Warm and Dry    Assessment and  Plan  Ischemic cardiomyopathy with prior CABG  Ventricular tachycardia-polymorphic  Congestive heart failure-chronic-systolic  Hypertension   implantable defibrillator-[name] device was interrogated.  The information was reviewed. No changes were made in the programming.     The patient is stable without recurrent ventricular tachycardia  He is euvolemic.  Blood pressures are reasonably controlled.  No symptoms of ischemia.

## 2016-06-23 NOTE — Patient Instructions (Addendum)
Medication Instructions:  - Your physician recommends that you continue on your current medications as directed. Please refer to the Current Medication list given to you today.  Labwork: - none  Testing/Procedures: - none  Follow-Up: - Remote monitoring is used to monitor your Pacemaker of ICD from home. This monitoring reduces the number of office visits required to check your device to one time per year. It allows [place] to keep an eye on the functioning of your device to ensure it is working properly. You are scheduled for a device check from home on 09/22/2016. You may send your transmission at any time that day. If you have a wireless device, the transmission will be sent automatically. After your physician reviews your transmission, you will receive a postcard with your next transmission date.  - Your physician wants you to follow-up in: 1 year with Dr. Caryl Comes.  You will receive a reminder letter in the mail two months in advance. If you don't receive a letter, please call our office to schedule the follow-up appointment.   Any Other Special Instructions Will Be Listed Below (If Applicable).   If you need a refill on your cardiac medications before your next appointment, please call your pharmacy.  Thank you for choosing [facility]!!

## 2016-06-24 LAB — CUP PACEART INCLINIC DEVICE CHECK
Battery Voltage: 2.96 V
Brady Statistic RV Percent Paced: 0.01 %
HIGH POWER IMPEDANCE MEASURED VALUE: 342 Ohm
HighPow Impedance: 46 Ohm
Lead Channel Impedance Value: 399 Ohm
Lead Channel Pacing Threshold Amplitude: 1 V
Lead Channel Sensing Intrinsic Amplitude: 23 mV
Lead Channel Setting Pacing Amplitude: 2.5 V
Lead Channel Setting Pacing Pulse Width: 0.4 ms
MDC IDC LEAD IMPLANT DT: 20031205
MDC IDC LEAD LOCATION: 753860
MDC IDC MSMT LEADCHNL RV PACING THRESHOLD PULSEWIDTH: 0.4 ms
MDC IDC MSMT LEADCHNL RV SENSING INTR AMPL: 28.125 mV
MDC IDC SESS DTM: 20170713183755
MDC IDC SET LEADCHNL RV SENSING SENSITIVITY: 0.3 mV

## 2016-07-07 ENCOUNTER — Telehealth: Payer: Self-pay | Admitting: Oncology

## 2016-07-07 ENCOUNTER — Ambulatory Visit (HOSPITAL_BASED_OUTPATIENT_CLINIC_OR_DEPARTMENT_OTHER): Payer: PPO | Admitting: Nurse Practitioner

## 2016-07-07 ENCOUNTER — Other Ambulatory Visit (HOSPITAL_BASED_OUTPATIENT_CLINIC_OR_DEPARTMENT_OTHER): Payer: PPO

## 2016-07-07 VITALS — BP 136/77 | HR 75 | Temp 98.0°F | Resp 18 | Ht 72.0 in | Wt 205.7 lb

## 2016-07-07 DIAGNOSIS — R351 Nocturia: Secondary | ICD-10-CM

## 2016-07-07 DIAGNOSIS — C160 Malignant neoplasm of cardia: Secondary | ICD-10-CM | POA: Diagnosis not present

## 2016-07-07 DIAGNOSIS — D649 Anemia, unspecified: Secondary | ICD-10-CM | POA: Diagnosis not present

## 2016-07-07 DIAGNOSIS — D696 Thrombocytopenia, unspecified: Secondary | ICD-10-CM | POA: Diagnosis not present

## 2016-07-07 DIAGNOSIS — K769 Liver disease, unspecified: Secondary | ICD-10-CM

## 2016-07-07 DIAGNOSIS — D500 Iron deficiency anemia secondary to blood loss (chronic): Secondary | ICD-10-CM

## 2016-07-07 DIAGNOSIS — E119 Type 2 diabetes mellitus without complications: Secondary | ICD-10-CM

## 2016-07-07 DIAGNOSIS — C159 Malignant neoplasm of esophagus, unspecified: Secondary | ICD-10-CM

## 2016-07-07 LAB — CBC WITH DIFFERENTIAL/PLATELET
BASO%: 0.2 % (ref 0.0–2.0)
BASOS ABS: 0 10*3/uL (ref 0.0–0.1)
EOS ABS: 0.1 10*3/uL (ref 0.0–0.5)
EOS%: 3.1 % (ref 0.0–7.0)
HCT: 35 % — ABNORMAL LOW (ref 38.4–49.9)
HEMOGLOBIN: 11.1 g/dL — AB (ref 13.0–17.1)
LYMPH%: 20.8 % (ref 14.0–49.0)
MCH: 27.9 pg (ref 27.2–33.4)
MCHC: 31.7 g/dL — ABNORMAL LOW (ref 32.0–36.0)
MCV: 87.9 fL (ref 79.3–98.0)
MONO#: 0.5 10*3/uL (ref 0.1–0.9)
MONO%: 11.2 % (ref 0.0–14.0)
NEUT%: 64.7 % (ref 39.0–75.0)
NEUTROS ABS: 2.7 10*3/uL (ref 1.5–6.5)
Platelets: 104 10*3/uL — ABNORMAL LOW (ref 140–400)
RBC: 3.98 10*6/uL — AB (ref 4.20–5.82)
RDW: 15.6 % — AB (ref 11.0–14.6)
WBC: 4.2 10*3/uL (ref 4.0–10.3)
lymph#: 0.9 10*3/uL (ref 0.9–3.3)

## 2016-07-07 NOTE — Telephone Encounter (Signed)
per pof to sch pt appt-gave pt copy of avs °

## 2016-07-07 NOTE — Progress Notes (Signed)
[place] OFFICE PROGRESS NOTE   Diagnosis:  Anemia, esophagus cancer  INTERVAL HISTORY:   Walter French returns as scheduled. He overall feels well. No bleeding. Energy level remains poor. Stable chronic dyspnea. No cough. No fever. No chest pain. He continues to have nocturia. No abdominal pain.  Objective:  Vital signs in last 24 hours:  Blood pressure 136/77, pulse 75, temperature 98 F (36.7 C), temperature source Oral, resp. rate 18, height 6' (1.829 m), weight 205 lb 11.2 oz (93.3 kg), SpO2 100 %.    HEENT: No thrush or ulcers. Lymphatics: No palpable cervical or supra clavicular lymph nodes. Resp: Faint inspiratory rales at the left posterior lung base. No respiratory distress. Cardio: Regular rate and rhythm. GI: Abdomen soft. Nontender. No hepatomegaly. No mass. Vascular: No leg edema.   Lab Results:  Lab Results  Component Value Date   WBC 4.2 07/07/2016   HGB 11.1 (L) 07/07/2016   HCT 35.0 (L) 07/07/2016   MCV 87.9 07/07/2016   PLT 104 (L) 07/07/2016   NEUTROABS 2.7 07/07/2016    Imaging:  No results found.  Medications: I have reviewed the patient's current medications.  Assessment/Plan: 1. Adenocarcinoma the gastroesophageal junction, status post an endoscopic biopsy of a distal esophagus mass and gastric cardia ulcer on 07/23/2015 ? Staging CT scans consistent with locally metastatic adenopathy ? PET scan 08/07/2015 with hypermetabolic mass at the distal esophagus/GE junction and a hypermetabolic paraesophageal, gastrohepatic, and portacaval lymph nodes ? Initiation of radiation 08/12/2015 with completion 09/24/2015; cycle 1 Taxol/carboplatin 08/13/2015; Final cycle given 09/22/2015. ? CT and a/pelvis 04/01/2016 with improvement of the esophageal tumor, decreased paraesophageal lymph nodes, abnormal wall thickening of the gastric antrum, new bilobed mass between the aorta and vena cava, ill-defined low-attenuation segment 5 hepatic  lesion 2. History of coronary artery disease 3. Congestive heart failure 4. History of ventricular tachycardia and supraventricular tachycardia 5. Peripheral arterial disease 6. Implantable defibrillator 7. Diabetes 8. Gout 9. Hospitalization from 09/10/15 to 09/15/15 due to Acute Hypoxic Respiratory Failure and Sepsis as well as new onset of Atrial Fibrillation, 10. Hospitalization 09/30/2015 through 10/02/2015 with probable UTI/bronchitis. 11. Hospitalization 10/05/2015 with rapid atrial fibrillation 12. 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 Hospitalization with GI bleed 02/17/2016 through 02/22/2016 status post upper endoscopy 02/18/2016 with the source of bleeding felt to be hemorrhagic gastritis. There was no evidence of residual mass in the distal esophagus. He continues twice daily Protonix. 14. Anemia most likely due to gastrointestinal bleeding. Improved.  Upper endoscopy 04/05/2016 showed nonbleeding esophageal and proximal gastric ulcers. Localized hemorrhagic mucosa with bleeding and stigmata of recent bleeding found in the cardia, gastric fundus, greater curvature of the stomach and lesser curvature of the stomach. Fulguration was performed.  Upper endoscopy 04/28/2016 showed a small hiatal hernia; one distal linear esophageal ulcer with no bleeding and no stigmata of recent bleeding; multiple stigmata of recent bleeding angiodysplastic lesions or telangiectasias from radiation found in the cardia, gastric fundus, greater curvature of the stomach, lesser curvature of the stomach and at the incisura; one nonbleeding cratered gastric ulcer found in the cardia. 15. Thrombocytopenia   Disposition: [name] appears stable. The hemoglobin is improved. He has chronic thrombocytopenia. The platelet count is mildly further decreased today. The plan is for a bone marrow biopsy if the platelets fall further.  He will have a repeat  CT scan of the abdomen/pelvis in approximately one month to follow-up the liver lesion and the new  mass between the aorta and vena cava on the CT scan from 04/01/2016.  He will return for a follow-up visit on 08/11/2016 to review the results of the CT scan. He will contact the office in the interim with any problems.  Plan reviewed with Dr. Benay Spice.    Ned Card ANP/GNP-BC   07/07/2016  8:57 AM

## 2016-07-09 ENCOUNTER — Other Ambulatory Visit: Payer: Self-pay | Admitting: Cardiology

## 2016-07-11 DIAGNOSIS — D126 Benign neoplasm of colon, unspecified: Secondary | ICD-10-CM | POA: Diagnosis not present

## 2016-07-11 DIAGNOSIS — D123 Benign neoplasm of transverse colon: Secondary | ICD-10-CM | POA: Diagnosis not present

## 2016-07-11 DIAGNOSIS — D120 Benign neoplasm of cecum: Secondary | ICD-10-CM | POA: Diagnosis not present

## 2016-07-11 DIAGNOSIS — D125 Benign neoplasm of sigmoid colon: Secondary | ICD-10-CM | POA: Diagnosis not present

## 2016-07-11 DIAGNOSIS — D122 Benign neoplasm of ascending colon: Secondary | ICD-10-CM | POA: Diagnosis not present

## 2016-07-11 DIAGNOSIS — Z86010 Personal history of colonic polyps: Secondary | ICD-10-CM | POA: Diagnosis not present

## 2016-07-11 DIAGNOSIS — Z0000 Encounter for general adult medical examination without abnormal findings: Secondary | ICD-10-CM | POA: Diagnosis not present

## 2016-07-11 DIAGNOSIS — K5730 Diverticulosis of large intestine without perforation or abscess without bleeding: Secondary | ICD-10-CM | POA: Diagnosis not present

## 2016-07-11 DIAGNOSIS — K621 Rectal polyp: Secondary | ICD-10-CM | POA: Diagnosis not present

## 2016-07-19 ENCOUNTER — Telehealth: Payer: Self-pay | Admitting: Oncology

## 2016-07-19 ENCOUNTER — Other Ambulatory Visit: Payer: Self-pay | Admitting: *Deleted

## 2016-07-19 ENCOUNTER — Telehealth: Payer: Self-pay | Admitting: *Deleted

## 2016-07-19 DIAGNOSIS — E119 Type 2 diabetes mellitus without complications: Secondary | ICD-10-CM | POA: Diagnosis not present

## 2016-07-19 DIAGNOSIS — E7800 Pure hypercholesterolemia, unspecified: Secondary | ICD-10-CM | POA: Diagnosis not present

## 2016-07-19 DIAGNOSIS — I10 Essential (primary) hypertension: Secondary | ICD-10-CM | POA: Diagnosis not present

## 2016-07-19 NOTE — Telephone Encounter (Signed)
spoke w/ pt confirmed added lab to 8/10

## 2016-07-19 NOTE — Telephone Encounter (Signed)
Call received from pt asking if he could get an earlier appt then 08/11/16 d/t new pain to back.  Pt states that pain is from his waist line to his shoulder and that it is an "irritating, burning and itching type pain".  Pt denies any open areas to his skin on his back.  He states that this is the same pain he experienced when he was first diagnosed with cancer.  Dr. Benay Spice notified of pt concerns and pt to be seen on Thursday, 07/21/16 at 1:45PM per Dr. Benay Spice.  Pt notified of time and appreciative of call.

## 2016-07-19 NOTE — Telephone Encounter (Signed)
Added apt per pof pt aware ° °

## 2016-07-20 ENCOUNTER — Other Ambulatory Visit: Payer: Self-pay | Admitting: *Deleted

## 2016-07-20 DIAGNOSIS — C159 Malignant neoplasm of esophagus, unspecified: Secondary | ICD-10-CM

## 2016-07-20 DIAGNOSIS — C160 Malignant neoplasm of cardia: Secondary | ICD-10-CM

## 2016-07-20 IMAGING — CT CT ABD-PEL WO/W CM
3 of 9 series · 15 of 46 positions shown, 17 images · IV contrast (Omni 300)
Comparison: Chest CT 07/20/2015. No prior abdominal pelvic imaging.

CLINICAL DATA: Esophageal mass diagnosed on endoscopy. Evaluate for
metastasis. Diabetes. Hypertension. Hiatal hernia.

EXAM:
CT ABDOMEN AND PELVIS WITHOUT AND WITH CONTRAST
TECHNIQUE: Multidetector CT imaging of the abdomen and pelvis was performed
following the standard protocol before and following the bolus
administration of intravenous contrast.
CONTRAST:  100mL OMNIPAQUE IOHEXOL 300 MG/ML  SOLN

[Series 2: a/p without 5.0 i30f w/o · axial · non-contrast · 0.81mm/px · z∈[+1171,+1611]mm · 9 of 111 slices shown, 11 images]
[im 12/111  soft-tissue]
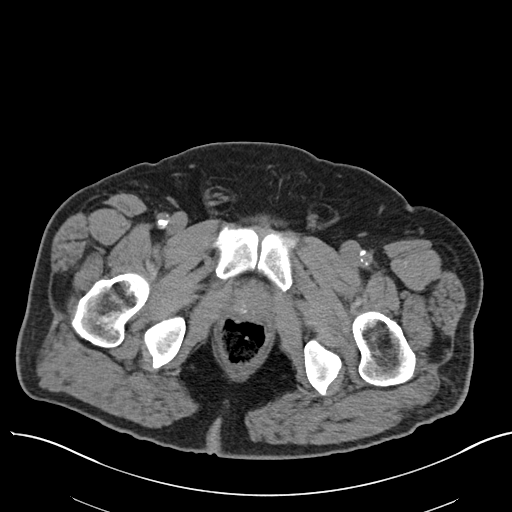
[im 12/111  bone]
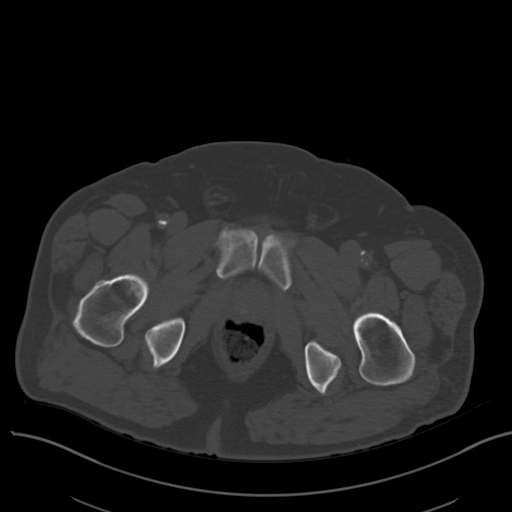
[im 23/111  soft-tissue]
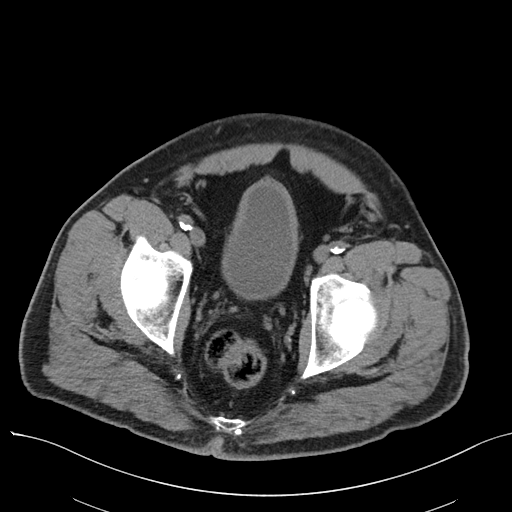
[im 34/111  soft-tissue]
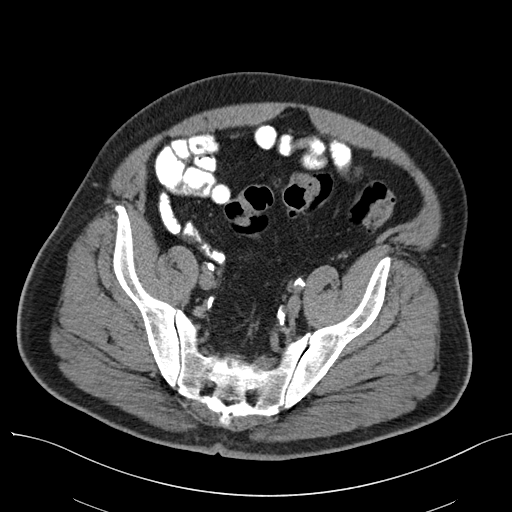
[im 45/111  soft-tissue]
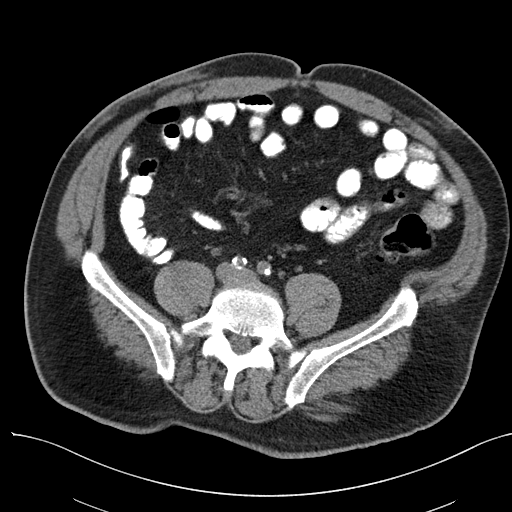
[im 56/111  soft-tissue]
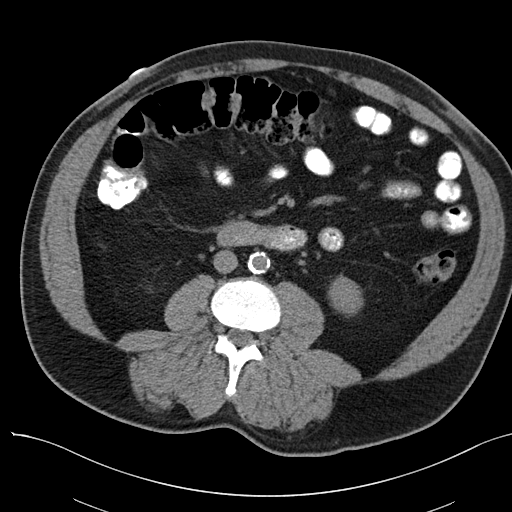
[im 67/111  soft-tissue]
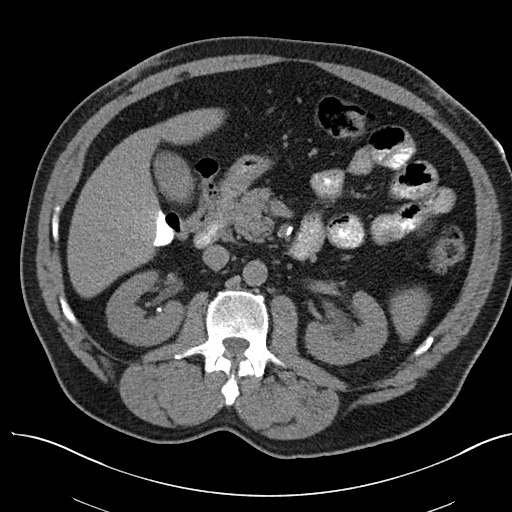
[im 78/111  soft-tissue]
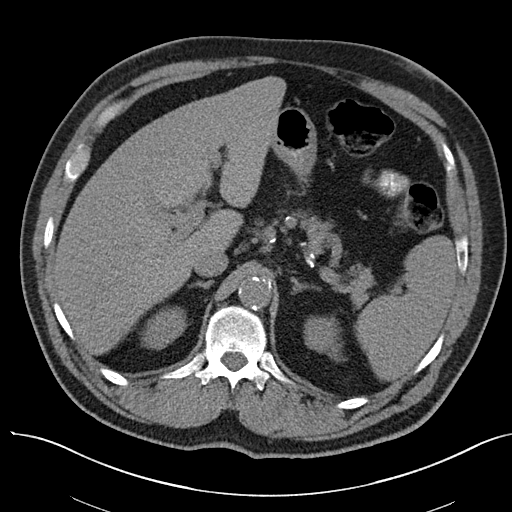
[im 89/111  soft-tissue]
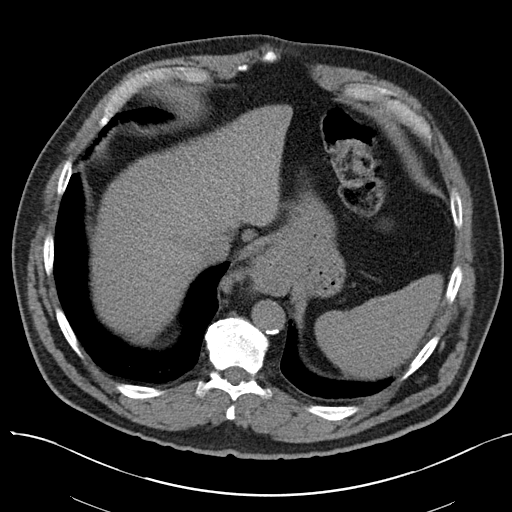
[im 100/111  soft-tissue]
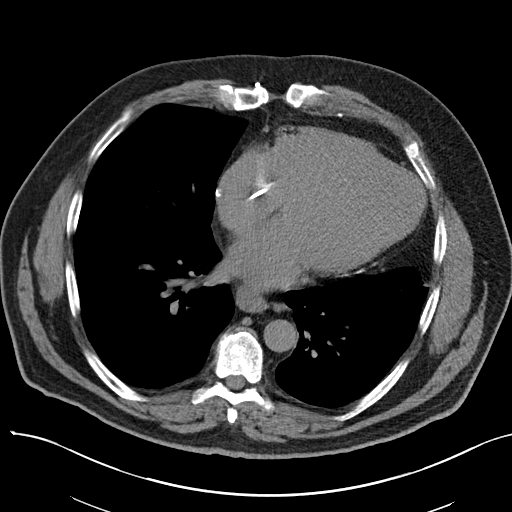
[im 100/111  bone]
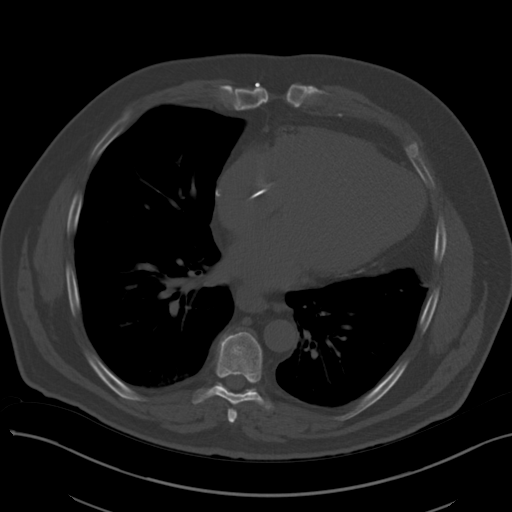

[Series 5: coronals w/o · coronal · non-contrast · 0.79mm/px · 3 of 181 slices shown]
[im 46/181  soft-tissue]
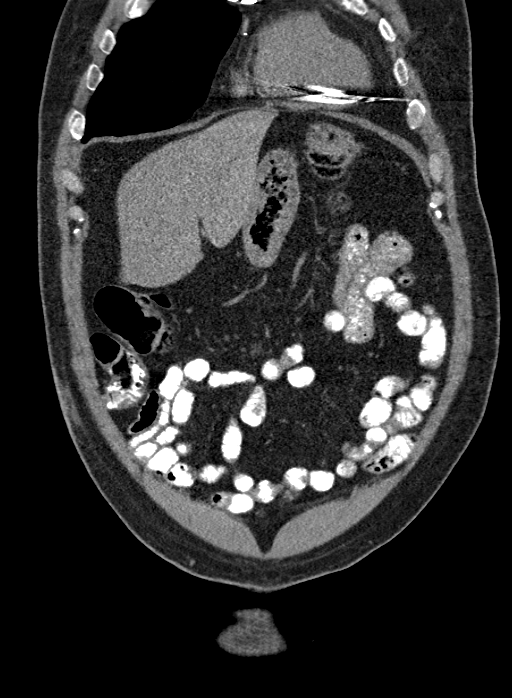
[im 91/181  soft-tissue]
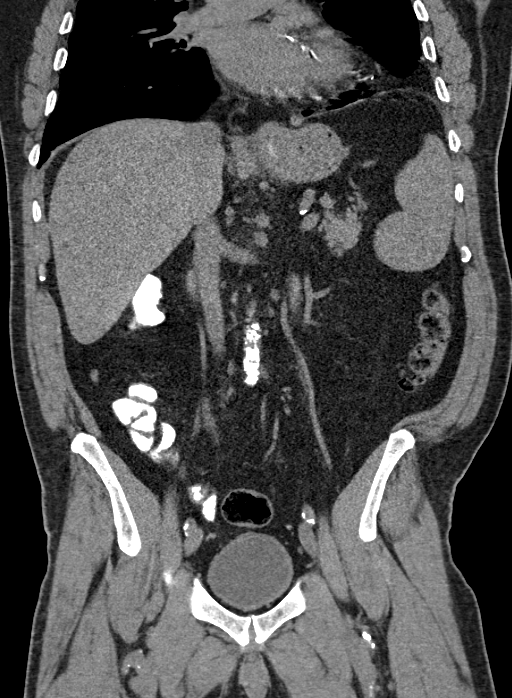
[im 136/181  soft-tissue]
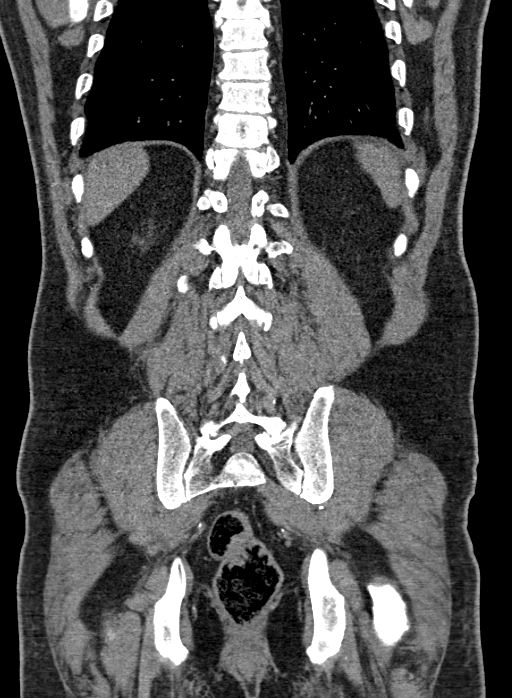

[Series 7: a/p with 5.0 i30f with · axial · 0.81mm/px · z∈[+1166,+1276]mm · 3 of 112 slices shown]
[im 12/112  soft-tissue]
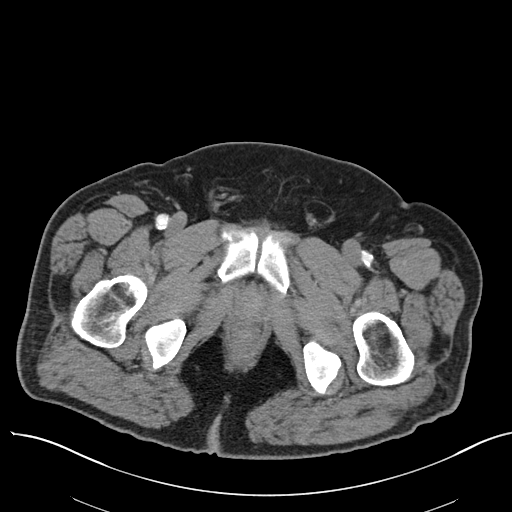
[im 23/112  soft-tissue]
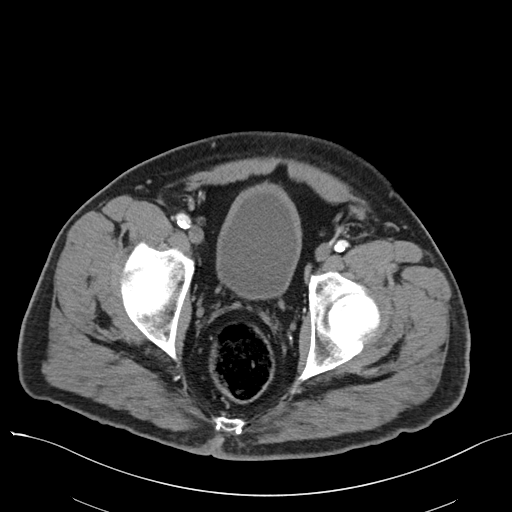
[im 34/112  soft-tissue]
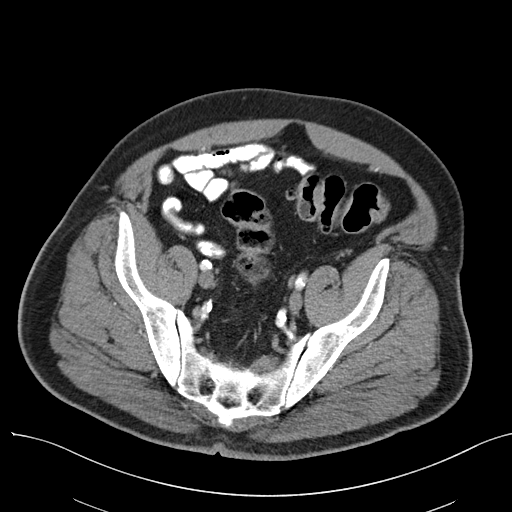

[15 of 46 positions shown; findings below may reference images not displayed]

FINDINGS: Lower chest: Clear lung bases. Mild cardiomegaly with prior median
sternotomy. No pericardial or pleural effusion.

9 mm node within the azygoesophageal recess on image 5 is upper
normal in size. Small hiatal hernia. Distal esophageal wall
thickening, as described previously. Example image 18 of series 7.
Adjacent periesophageal adenopathy, occluding a 13 mm node on image
19 which likely demonstrates central necrosis.

Hepatobiliary: Mild hepatic steatosis, without focal liver lesion.
Normal gallbladder, without biliary ductal dilatation.

Pancreas: Normal, without mass or ductal dilatation.

Spleen: Mild splenomegaly, 14.5 cm craniocaudal.

Adrenals/Urinary Tract: Normal adrenal glands. Primarily renal
vascular calcifications. Cannot exclude punctate collecting system
calculi. No hydronephrosis. No hydroureter or ureteric calculi. No
bladder calculi. Left renal cysts and bilateral too small to
characterize lesions. Left renal sinus cyst. No bladder mass.

Stomach/Bowel: Soft tissue thickening which likely extends to the
gastric cardia, including on image 25 of series 7. More distal
stomach is unremarkable. Scattered colonic diverticula. Normal
terminal ileum and appendix. Normal small bowel.

Vascular/Lymphatic: Advanced aortic and branch vessel
atherosclerosis. Mild portacaval adenopathy at 1.8 cm on image 38 of
series [DATE] cm gastrohepatic ligament node on image 26 is
moderately enlarged. Smaller nodes also identified along the lesser
curvature of the stomach on images 28 and 29. No pelvic adenopathy.

Reproductive: Normal prostate.

Other: No significant free fluid. Fat containing left inguinal
hernia is small. No ascites. No evidence of omental or peritoneal
disease. Subcutaneous edema about the anterior abdominal wall
bilaterally is nonspecific but may relate to injection sites.

Musculoskeletal: No acute osseous abnormality.
IMPRESSION: 1. Distal esophageal primary, with possible extension into the
gastric cardia. Adjacent periesophageal and gastrohepatic ligament
adenopathy, consistent with nodal metastasis. An enlarged portacaval
node could represent a metastasis or be reactive (related to mild
steatosis).
2. No evidence of distant metastasis within the abdomen or pelvis.
3. Mild hepatic steatosis.
4. Renal vascular calcifications. Cannot exclude concurrent punctate
collecting system calculi.
5. Mild splenomegaly.

## 2016-07-21 ENCOUNTER — Ambulatory Visit (HOSPITAL_BASED_OUTPATIENT_CLINIC_OR_DEPARTMENT_OTHER): Payer: PPO | Admitting: Nurse Practitioner

## 2016-07-21 ENCOUNTER — Other Ambulatory Visit (HOSPITAL_BASED_OUTPATIENT_CLINIC_OR_DEPARTMENT_OTHER): Payer: PPO

## 2016-07-21 VITALS — BP 131/66 | HR 76 | Temp 98.0°F | Resp 18 | Ht 72.0 in | Wt 203.8 lb

## 2016-07-21 DIAGNOSIS — C159 Malignant neoplasm of esophagus, unspecified: Secondary | ICD-10-CM

## 2016-07-21 DIAGNOSIS — E119 Type 2 diabetes mellitus without complications: Secondary | ICD-10-CM

## 2016-07-21 DIAGNOSIS — K769 Liver disease, unspecified: Secondary | ICD-10-CM

## 2016-07-21 DIAGNOSIS — G8929 Other chronic pain: Secondary | ICD-10-CM

## 2016-07-21 DIAGNOSIS — D649 Anemia, unspecified: Secondary | ICD-10-CM

## 2016-07-21 DIAGNOSIS — C160 Malignant neoplasm of cardia: Secondary | ICD-10-CM

## 2016-07-21 DIAGNOSIS — M545 Low back pain: Secondary | ICD-10-CM | POA: Diagnosis not present

## 2016-07-21 DIAGNOSIS — R0609 Other forms of dyspnea: Secondary | ICD-10-CM

## 2016-07-21 DIAGNOSIS — D500 Iron deficiency anemia secondary to blood loss (chronic): Secondary | ICD-10-CM

## 2016-07-21 LAB — CBC WITH DIFFERENTIAL/PLATELET
BASO%: 0.4 % (ref 0.0–2.0)
Basophils Absolute: 0 10*3/uL (ref 0.0–0.1)
EOS%: 2.8 % (ref 0.0–7.0)
Eosinophils Absolute: 0.1 10*3/uL (ref 0.0–0.5)
HCT: 35.4 % — ABNORMAL LOW (ref 38.4–49.9)
HGB: 11.1 g/dL — ABNORMAL LOW (ref 13.0–17.1)
LYMPH%: 21.1 % (ref 14.0–49.0)
MCH: 27.2 pg (ref 27.2–33.4)
MCHC: 31.4 g/dL — ABNORMAL LOW (ref 32.0–36.0)
MCV: 86.8 fL (ref 79.3–98.0)
MONO#: 0.5 10*3/uL (ref 0.1–0.9)
MONO%: 11 % (ref 0.0–14.0)
NEUT%: 64.7 % (ref 39.0–75.0)
NEUTROS ABS: 3 10*3/uL (ref 1.5–6.5)
Platelets: 109 10*3/uL — ABNORMAL LOW (ref 140–400)
RBC: 4.08 10*6/uL — AB (ref 4.20–5.82)
RDW: 15.4 % — ABNORMAL HIGH (ref 11.0–14.6)
WBC: 4.6 10*3/uL (ref 4.0–10.3)
lymph#: 1 10*3/uL (ref 0.9–3.3)

## 2016-07-21 NOTE — Progress Notes (Addendum)
[facility] OFFICE PROGRESS NOTE   Diagnosis:  Anemia, esophagus cancer  INTERVAL HISTORY:   Walter French returns prior to scheduled follow-up for evaluation of back pain. He reports a 2 week history of a burning, itching, at times numb, sensation below the right shoulder blade. No rash. He has stable chronic low back pain. No extremity weakness or numbness. Stable chronic dyspnea.  Objective:  Vital signs in last 24 hours:  Blood pressure 131/66, pulse 76, temperature 98 F (36.7 C), temperature source Oral, resp. rate 18, height 6' (1.829 m), weight 203 lb 12.8 oz (92.4 kg), SpO2 100 %.    HEENT: No thrush or ulcers. Lymphatics: No palpable cervical or supraclavicular lymph nodes. Resp: Lungs clear bilaterally. Cardio: Regular rate and rhythm. GI: Abdomen soft and nontender. No hepatomegaly. Vascular: No leg edema. Neuro: Alert and oriented. Motor strength 5 over 5. Knee DTRs 2+, symmetric.  Skin: No rash at the right mid back region. Musculoskeletal: Nontender inferior to the right scapula.    Lab Results:  Lab Results  Component Value Date   WBC 4.6 07/21/2016   HGB 11.1 (L) 07/21/2016   HCT 35.4 (L) 07/21/2016   MCV 86.8 07/21/2016   PLT 109 (L) 07/21/2016   NEUTROABS 3.0 07/21/2016    Imaging:  No results found.  Medications: I have reviewed the patient's current medications.  Assessment/Plan: 1. Adenocarcinoma the gastroesophageal junction, status post an endoscopic biopsy of a distal esophagus mass and gastric cardia ulcer on 07/23/2015 ? Staging CT scans consistent withlocally metastatic adenopathy ? PET scan 08/07/2015 with hypermetabolic mass at the distal esophagus/GE junction and a hypermetabolic paraesophageal, gastrohepatic, and portacaval lymph nodes ? Initiation of radiation 08/12/2015 with completion 09/24/2015; cycle 1 Taxol/carboplatin 08/13/2015; Final cycle given 09/22/2015. ? CT and a/pelvis 04/01/2016 with improvement of the  esophageal tumor, decreased paraesophageal lymph nodes, abnormal wall thickening of the gastric antrum, new bilobed mass between the aorta and vena cava, ill-defined low-attenuation segment 5 hepatic lesion 2. History of coronary artery disease 3. Congestive heart failure 4. History of ventricular tachycardia and supraventricular tachycardia 5. Peripheral arterial disease 6. Implantable defibrillator 7. Diabetes 8. Gout 9. Hospitalization from 09/10/15 to 09/15/15 due to Acute Hypoxic Respiratory Failure and Sepsis as well as new onset of Atrial Fibrillation, 10. Hospitalization 09/30/2015 through 10/02/2015 with probable UTI/bronchitis. 11. Hospitalization 10/05/2015 with rapid atrial fibrillation 12. 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 Hospitalization with GI bleed 02/17/2016 through 02/22/2016 status post upper endoscopy 02/18/2016 with the source of bleeding felt to be hemorrhagic gastritis. There was no evidence of residual mass in the distal esophagus. He continues twice daily Protonix. 14. Anemia most likely due to gastrointestinal bleeding. Improved.  Upper endoscopy 04/05/2016 showed nonbleeding esophageal and proximal gastric ulcers. Localized hemorrhagic mucosa with bleeding and stigmata of recent bleeding found in the cardia, gastric fundus, greater curvature of the stomach and lesser curvature of the stomach. Fulguration was performed.  Upper endoscopy 04/28/2016 showed a small hiatal hernia; one distal linear esophageal ulcer with no bleeding and no stigmata of recent bleeding; multiple stigmata of recent bleeding angiodysplastic lesions or telangiectasias from radiation found in the cardia, gastric fundus, greater curvature of the stomach, lesser curvature of the stomach and at the incisura; one nonbleeding cratered gastric ulcer found in the cardia. 15. Thrombocytopenia   Disposition: Walter French appears stable. The  etiology of the discomfort inferior to the right scapula is unclear. No rash to suggest herpes zoster.  He is scheduled for restaging CT scans of the abdomen/pelvis in the near future. We will add a chest CT on the same day. He will follow up as scheduled on 08/11/2016 to review the results. He understands to contact the office if current symptoms worsen or he develops extremity weakness/numbness, bowel/bladder dysfunction or other concerning symptoms.  Patient seen with Dr. Benay Spice.    Ned Card ANP/GNP-BC   07/21/2016  1:54 PM This was a shared visit with Ned Card. The etiology of his mild back discomfort is unclear. We have a low suspicion for progressive esophagus cancer. He will undergo restaging CTs prior to an office visit on 08/11/2016.  Julieanne Manson, M.D.

## 2016-08-08 ENCOUNTER — Other Ambulatory Visit (HOSPITAL_BASED_OUTPATIENT_CLINIC_OR_DEPARTMENT_OTHER): Payer: PPO

## 2016-08-08 ENCOUNTER — Ambulatory Visit (HOSPITAL_COMMUNITY)
Admission: RE | Admit: 2016-08-08 | Discharge: 2016-08-08 | Disposition: A | Discharge status: Home / Self Care | Payer: PPO | Source: Ambulatory Visit | Attending: Nurse Practitioner | Admitting: Nurse Practitioner

## 2016-08-08 DIAGNOSIS — R590 Localized enlarged lymph nodes: Secondary | ICD-10-CM | POA: Diagnosis not present

## 2016-08-08 DIAGNOSIS — D500 Iron deficiency anemia secondary to blood loss (chronic): Secondary | ICD-10-CM

## 2016-08-08 DIAGNOSIS — C160 Malignant neoplasm of cardia: Secondary | ICD-10-CM | POA: Diagnosis not present

## 2016-08-08 DIAGNOSIS — I517 Cardiomegaly: Secondary | ICD-10-CM | POA: Diagnosis not present

## 2016-08-08 DIAGNOSIS — C159 Malignant neoplasm of esophagus, unspecified: Secondary | ICD-10-CM | POA: Diagnosis not present

## 2016-08-08 DIAGNOSIS — J90 Pleural effusion, not elsewhere classified: Secondary | ICD-10-CM | POA: Diagnosis not present

## 2016-08-08 DIAGNOSIS — K769 Liver disease, unspecified: Secondary | ICD-10-CM | POA: Insufficient documentation

## 2016-08-08 DIAGNOSIS — R161 Splenomegaly, not elsewhere classified: Secondary | ICD-10-CM | POA: Insufficient documentation

## 2016-08-08 DIAGNOSIS — K8020 Calculus of gallbladder without cholecystitis without obstruction: Secondary | ICD-10-CM | POA: Insufficient documentation

## 2016-08-08 DIAGNOSIS — I708 Atherosclerosis of other arteries: Secondary | ICD-10-CM | POA: Insufficient documentation

## 2016-08-08 DIAGNOSIS — R609 Edema, unspecified: Secondary | ICD-10-CM | POA: Diagnosis not present

## 2016-08-08 DIAGNOSIS — K7689 Other specified diseases of liver: Secondary | ICD-10-CM | POA: Diagnosis not present

## 2016-08-08 LAB — BASIC METABOLIC PANEL
ANION GAP: 8 meq/L (ref 3–11)
BUN: 11.1 mg/dL (ref 7.0–26.0)
CALCIUM: 9.5 mg/dL (ref 8.4–10.4)
CO2: 26 mEq/L (ref 22–29)
Chloride: 107 mEq/L (ref 98–109)
Creatinine: 1 mg/dL (ref 0.7–1.3)
EGFR: 78 mL/min/{1.73_m2} — AB (ref >90)
GLUCOSE: 117 mg/dL (ref 70–140)
Potassium: 4.3 mEq/L (ref 3.5–5.1)
SODIUM: 141 meq/L (ref 136–145)

## 2016-08-08 LAB — CBC WITH DIFFERENTIAL/PLATELET
BASO%: 0.6 % (ref 0.0–2.0)
BASOS ABS: 0 10*3/uL (ref 0.0–0.1)
EOS ABS: 0.2 10*3/uL (ref 0.0–0.5)
EOS%: 3.1 % (ref 0.0–7.0)
HCT: 40 % (ref 38.4–49.9)
HGB: 12.7 g/dL — ABNORMAL LOW (ref 13.0–17.1)
LYMPH%: 17 % (ref 14.0–49.0)
MCH: 27.2 pg (ref 27.2–33.4)
MCHC: 31.7 g/dL — ABNORMAL LOW (ref 32.0–36.0)
MCV: 85.8 fL (ref 79.3–98.0)
MONO#: 0.6 10*3/uL (ref 0.1–0.9)
MONO%: 11.2 % (ref 0.0–14.0)
NEUT%: 68.1 % (ref 39.0–75.0)
NEUTROS ABS: 3.6 10*3/uL (ref 1.5–6.5)
PLATELETS: 100 10*3/uL — AB (ref 140–400)
RBC: 4.66 10*6/uL (ref 4.20–5.82)
RDW: 17.6 % — ABNORMAL HIGH (ref 11.0–14.6)
WBC: 5.3 10*3/uL (ref 4.0–10.3)
lymph#: 0.9 10*3/uL (ref 0.9–3.3)

## 2016-08-08 MED ORDER — IOPAMIDOL (ISOVUE-300) INJECTION 61%
100.0000 mL | Freq: Once | INTRAVENOUS | Status: AC | PRN
  Administered 2016-08-08: 100 mL via INTRAVENOUS

## 2016-08-11 ENCOUNTER — Ambulatory Visit (HOSPITAL_BASED_OUTPATIENT_CLINIC_OR_DEPARTMENT_OTHER): Payer: PPO | Admitting: Oncology

## 2016-08-11 ENCOUNTER — Telehealth: Payer: Self-pay | Admitting: Oncology

## 2016-08-11 ENCOUNTER — Other Ambulatory Visit: Payer: PPO

## 2016-08-11 VITALS — BP 128/58 | HR 74 | Temp 97.7°F | Resp 18 | Ht 72.0 in | Wt 206.4 lb

## 2016-08-11 DIAGNOSIS — C160 Malignant neoplasm of cardia: Secondary | ICD-10-CM

## 2016-08-11 DIAGNOSIS — K769 Liver disease, unspecified: Secondary | ICD-10-CM

## 2016-08-11 NOTE — Telephone Encounter (Signed)
GAVE PATIENT AVS REPORT AND APPOINTMENTS FOR September. [facility] WILL CALL RE BX - PATIENT AWARE.

## 2016-08-11 NOTE — Progress Notes (Signed)
[facility] OFFICE PROGRESS NOTE   Diagnosis: Gastroesophageal cancer  INTERVAL HISTORY:   Walter French returns as scheduled. He has dysphagia with some foods, but in general he has no difficulty with swallowing. Stable exertional dyspnea. No bleeding.  Objective:  Vital signs in last 24 hours:  Blood pressure (!) 128/58, pulse 74, temperature 97.7 F (36.5 C), temperature source Oral, resp. rate 18, height 6' (1.829 m), weight 206 lb 6.4 oz (93.6 kg), SpO2 97 %.    HEENT: Neck without mass Lymphatics: No cervical, supraclavicular, axillary, or inguinal nodes Resp: Lungs with scattered end inspiratory rales at the posterior bases Cardio: Regular rate and rhythm GI: No hepatosplenomegaly, nontender, no mass Vascular: No leg edema   Lab Results:  Lab Results  Component Value Date   WBC 5.3 08/08/2016   HGB 12.7 (L) 08/08/2016   HCT 40.0 08/08/2016   MCV 85.8 08/08/2016   PLT 100 (L) 08/08/2016   NEUTROABS 3.6 08/08/2016     Imaging:  Ct Chest W Contrast  Result Date: 08/08/2016 CLINICAL DATA:  Restaging esophageal/stomach cancer diagnosed in 2016. Radiation and chemotherapy completed in 2017. Left mid back pain. EXAM: CT CHEST, ABDOMEN, AND PELVIS WITH CONTRAST TECHNIQUE: Multidetector CT imaging of the chest, abdomen and pelvis was performed following the standard protocol during bolus administration of intravenous contrast. CONTRAST:  182mL ISOVUE-300 IOPAMIDOL (ISOVUE-300) INJECTION 61% COMPARISON:  Multiple exams, including 04/01/2016 and 11/14/2015 FINDINGS: CT CHEST FINDINGS Cardiovascular: Coronary, aortic arch, and branch vessel atherosclerotic vascular disease. Prior CABG. Mild cardiomegaly. AICD noted. Mediastinum/Nodes: Small paratracheal lymph nodes are not pathologically enlarged by size criteria. There is wall thickening of the distal esophagus circumferentially. A distal paraesophageal node measures 9 mm in short axis on image 49/2, previously 7 mm  on 04/01/2016. A left periesophageal lymph node measures 8 mm in short axis on image 39/2, formerly the same. Additional smaller periesophageal lymph nodes are present. Lungs/Pleura: Small left and trace right pleural effusions, the right pleural effusion is reduced in size from previous on the left is about the same. There is pleural enhancement on the left and potentially some loculated fluid anteriorly on the right, both of which are compatible with an exudative etiology. There is atelectatic lung along both pleural effusions, and some of the airspace opacity in the paramediastinal lower regions may well be due to radiation port, especially on the right. Mild right apical pleuroparenchymal scarring. Mild findings of emphysema. Musculoskeletal: Chronic atrophy of the medial portion of the left pectoralis major muscle. Prior median sternotomy. No compelling findings of osseous metastatic disease in the chest. Remote T8 compression fracture, not changed. CT ABDOMEN PELVIS FINDINGS Hepatobiliary: The very ill-defined hypodense lesion in the right hepatic lobe measures approximately 3.8 by 2.7 cm on image 58/2, by my measurements this was previously 2.0 by 1.7 cm no other definite hepatic mass is identified. Contracted gallbladder with wall thickening and a 4 mm gallstone. Mild pericholecystic stranding. The appearance of the gallbladder is not appreciably changed from 04/01/2016. Periportal edema extending into the porta hepatis. Pancreas: Chronic fullness in the head of the pancreas, this is not been hypermetabolic previously and accordingly is likely incidental/ normal variant. Spleen: Splenic size 13.8 by 6.3 by 15.6 cm (volume = 710 cm^3). This is compatible with moderate splenomegaly. Adrenals/Urinary Tract: Adrenal glands normal. Hypodense bilateral renal lesions are compatible with cysts were large enough to measure, and not appreciably changed from prior. Vascular calcifications in the renal hila.  Stomach/Bowel: Generalized mild gastric wall  thickening, possible focal thickening along the gastric body for example on image 49/2, not changed. Descending and sigmoid colon diverticulosis. Vascular/Lymphatic: Lymph node interposed between the right hemidiaphragm in the caudate lobe measures 9 mm in short axis on image 52/2, previously 11 mm. Peripancreatic node measures 9 mm in short axis on image 57/2, formerly 10 mm. Portacaval node 1.3 cm in short axis on image 62/2, formerly 0.9 cm. Aortocaval node 1.2 cm in short axis on image 70/2, formerly the same. Dense arterial atherosclerotic calcification. Reproductive: Borderline prominent prostate gland with central zone calcifications. Other: Trace ascites in the right paracolic gutter, similar to prior. Trace pelvic ascites as on image 108/2, less than before. Musculoskeletal: Unremarkable IMPRESSION: 1. The right hepatic lobe ill-defined lesion has enlarged, currently 3.8 by 2.7 cm and previously 2.0 by 1.7 cm. 2. Generally stable remaining lymph nodes, with the largest of the residual pathologic lymph nodes measuring 1.2 cm in the aortocaval region. 3. Moderate splenomegaly. 4. Periportal edema, with low-grade infiltrative edema in the porta hepatis and a small amount of ascites which is reduced from prior. 5. Small left and trace right exudative pleural effusions, the right pleural effusion is reduced in size from prior. 6. Continued wall thickening in the distal esophagus with adjacent small paraesophageal lymph nodes. Generalized wall thickening in the stomach. 7. Persistent wall thickening and stranding associated with the gallbladder with a small gallstone, chronic cholecystitis is not excluded. 8. Extensive arterial atherosclerosis in the chest, abdomen, and pelvis. Mild cardiomegaly. Electronically Signed   By: Van Clines M.D.   On: 08/08/2016 11:51   Ct Abdomen Pelvis W Contrast  Result Date: 08/08/2016 CLINICAL DATA:  Restaging  esophageal/stomach cancer diagnosed in 2016. Radiation and chemotherapy completed in 2017. Left mid back pain. EXAM: CT CHEST, ABDOMEN, AND PELVIS WITH CONTRAST TECHNIQUE: Multidetector CT imaging of the chest, abdomen and pelvis was performed following the standard protocol during bolus administration of intravenous contrast. CONTRAST:  170mL ISOVUE-300 IOPAMIDOL (ISOVUE-300) INJECTION 61% COMPARISON:  Multiple exams, including 04/01/2016 and 11/14/2015 FINDINGS: CT CHEST FINDINGS Cardiovascular: Coronary, aortic arch, and branch vessel atherosclerotic vascular disease. Prior CABG. Mild cardiomegaly. AICD noted. Mediastinum/Nodes: Small paratracheal lymph nodes are not pathologically enlarged by size criteria. There is wall thickening of the distal esophagus circumferentially. A distal paraesophageal node measures 9 mm in short axis on image 49/2, previously 7 mm on 04/01/2016. A left periesophageal lymph node measures 8 mm in short axis on image 39/2, formerly the same. Additional smaller periesophageal lymph nodes are present. Lungs/Pleura: Small left and trace right pleural effusions, the right pleural effusion is reduced in size from previous on the left is about the same. There is pleural enhancement on the left and potentially some loculated fluid anteriorly on the right, both of which are compatible with an exudative etiology. There is atelectatic lung along both pleural effusions, and some of the airspace opacity in the paramediastinal lower regions may well be due to radiation port, especially on the right. Mild right apical pleuroparenchymal scarring. Mild findings of emphysema. Musculoskeletal: Chronic atrophy of the medial portion of the left pectoralis major muscle. Prior median sternotomy. No compelling findings of osseous metastatic disease in the chest. Remote T8 compression fracture, not changed. CT ABDOMEN PELVIS FINDINGS Hepatobiliary: The very ill-defined hypodense lesion in the right hepatic  lobe measures approximately 3.8 by 2.7 cm on image 58/2, by my measurements this was previously 2.0 by 1.7 cm no other definite hepatic mass is identified. Contracted gallbladder with wall thickening  and a 4 mm gallstone. Mild pericholecystic stranding. The appearance of the gallbladder is not appreciably changed from 04/01/2016. Periportal edema extending into the porta hepatis. Pancreas: Chronic fullness in the head of the pancreas, this is not been hypermetabolic previously and accordingly is likely incidental/ normal variant. Spleen: Splenic size 13.8 by 6.3 by 15.6 cm (volume = 710 cm^3). This is compatible with moderate splenomegaly. Adrenals/Urinary Tract: Adrenal glands normal. Hypodense bilateral renal lesions are compatible with cysts were large enough to measure, and not appreciably changed from prior. Vascular calcifications in the renal hila. Stomach/Bowel: Generalized mild gastric wall thickening, possible focal thickening along the gastric body for example on image 49/2, not changed. Descending and sigmoid colon diverticulosis. Vascular/Lymphatic: Lymph node interposed between the right hemidiaphragm in the caudate lobe measures 9 mm in short axis on image 52/2, previously 11 mm. Peripancreatic node measures 9 mm in short axis on image 57/2, formerly 10 mm. Portacaval node 1.3 cm in short axis on image 62/2, formerly 0.9 cm. Aortocaval node 1.2 cm in short axis on image 70/2, formerly the same. Dense arterial atherosclerotic calcification. Reproductive: Borderline prominent prostate gland with central zone calcifications. Other: Trace ascites in the right paracolic gutter, similar to prior. Trace pelvic ascites as on image 108/2, less than before. Musculoskeletal: Unremarkable IMPRESSION: 1. The right hepatic lobe ill-defined lesion has enlarged, currently 3.8 by 2.7 cm and previously 2.0 by 1.7 cm. 2. Generally stable remaining lymph nodes, with the largest of the residual pathologic lymph nodes  measuring 1.2 cm in the aortocaval region. 3. Moderate splenomegaly. 4. Periportal edema, with low-grade infiltrative edema in the porta hepatis and a small amount of ascites which is reduced from prior. 5. Small left and trace right exudative pleural effusions, the right pleural effusion is reduced in size from prior. 6. Continued wall thickening in the distal esophagus with adjacent small paraesophageal lymph nodes. Generalized wall thickening in the stomach. 7. Persistent wall thickening and stranding associated with the gallbladder with a small gallstone, chronic cholecystitis is not excluded. 8. Extensive arterial atherosclerosis in the chest, abdomen, and pelvis. Mild cardiomegaly. Electronically Signed   By: Van Clines M.D.   On: 08/08/2016 11:51    Medications: I have reviewed the patient's current medications.  Assessment/Plan: 1. Adenocarcinoma the gastroesophageal junction, status post an endoscopic biopsy of a distal esophagus mass and gastric cardia ulcer on 07/23/2015 ? Staging CT scans consistent withlocally metastatic adenopathy ? PET scan 08/07/2015 with hypermetabolic mass at the distal esophagus/GE junction and a hypermetabolic paraesophageal, gastrohepatic, and portacaval lymph nodes ? Initiation of radiation 08/12/2015 with completion 09/24/2015; cycle 1 Taxol/carboplatin 08/13/2015; Final cycle given 09/22/2015. ? CT and a/pelvis 04/01/2016 with improvement of the esophageal tumor, decreased paraesophageal lymph nodes, abnormal wall thickening of the gastric antrum, new bilobed mass between the aorta and vena cava, ill-defined low-attenuation segment 5 hepatic lesion ? CTs 08/08/2016-stable abdominal lymph nodes, enlarging right liver lesion, persistent thickening at the distal esophagus 2. History of coronary artery disease 3. Congestive heart failure 4. History of ventricular tachycardia and supraventricular tachycardia 5. Peripheral arterial disease 6. Implantable  defibrillator 7. Diabetes 8. Gout 9. Hospitalization from 09/10/15 to 09/15/15 due to Acute Hypoxic Respiratory Failure and Sepsis as well as new onset of Atrial Fibrillation, 10. Hospitalization 09/30/2015 through 10/02/2015 with probable UTI/bronchitis. 11. Hospitalization 10/05/2015 with rapid atrial fibrillation 12. 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 Hospitalization with GI bleed 02/17/2016 through 02/22/2016 status post upper endoscopy 02/18/2016  with the source of bleeding felt to be hemorrhagic gastritis. There was no evidence of residual mass in the distal esophagus. He continues twice daily Protonix. 14. Anemia most likely due to gastrointestinal bleeding. Improved.  Upper endoscopy 04/05/2016 showed nonbleeding esophageal and proximal gastric ulcers. Localized hemorrhagic mucosa with bleeding and stigmata of recent bleeding found in the cardia, gastric fundus, greater curvature of the stomach and lesser curvature of the stomach. Fulguration was performed.  Upper endoscopy 04/28/2016 showed a small hiatal hernia; one distal linear esophageal ulcer with no bleeding and no stigmata of recent bleeding; multiple stigmata of recent bleeding angiodysplastic lesions or telangiectasias from radiation found in the cardia, gastric fundus, greater curvature of the stomach, lesser curvature of the stomach and at the incisura; one nonbleeding cratered gastric ulcer found in the cardia. 15. Thrombocytopenia    Disposition:  Walter French appears unchanged. It appears the GI bleeding has resolved.  I reviewed the CT images with him. The right liver lesion has enlarged. No other evidence of disease progression. 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 We decided to proceed with a biopsy to confirm the diagnosis. We will submit Foundation 1 testing on the esophagus tumor. He will return for an office visit  in 2-3 weeks to discuss treatment options.    Betsy Coder, MD  08/11/2016  11:06 AM

## 2016-08-23 ENCOUNTER — Other Ambulatory Visit: Payer: Self-pay | Admitting: Cardiology

## 2016-08-24 ENCOUNTER — Other Ambulatory Visit: Payer: Self-pay | Admitting: Radiology

## 2016-08-25 ENCOUNTER — Ambulatory Visit (HOSPITAL_COMMUNITY)
Admission: RE | Admit: 2016-08-25 | Discharge: 2016-08-25 | Disposition: A | Discharge status: Home / Self Care | Payer: PPO | Source: Ambulatory Visit | Attending: Oncology | Admitting: Oncology

## 2016-08-25 ENCOUNTER — Encounter (HOSPITAL_COMMUNITY): Payer: Self-pay

## 2016-08-25 DIAGNOSIS — I110 Hypertensive heart disease with heart failure: Secondary | ICD-10-CM | POA: Insufficient documentation

## 2016-08-25 DIAGNOSIS — C160 Malignant neoplasm of cardia: Secondary | ICD-10-CM | POA: Diagnosis not present

## 2016-08-25 DIAGNOSIS — G4733 Obstructive sleep apnea (adult) (pediatric): Secondary | ICD-10-CM | POA: Diagnosis not present

## 2016-08-25 DIAGNOSIS — C787 Secondary malignant neoplasm of liver and intrahepatic bile duct: Secondary | ICD-10-CM | POA: Insufficient documentation

## 2016-08-25 DIAGNOSIS — R160 Hepatomegaly, not elsewhere classified: Secondary | ICD-10-CM | POA: Insufficient documentation

## 2016-08-25 DIAGNOSIS — F419 Anxiety disorder, unspecified: Secondary | ICD-10-CM | POA: Insufficient documentation

## 2016-08-25 DIAGNOSIS — E785 Hyperlipidemia, unspecified: Secondary | ICD-10-CM | POA: Insufficient documentation

## 2016-08-25 DIAGNOSIS — C159 Malignant neoplasm of esophagus, unspecified: Secondary | ICD-10-CM | POA: Diagnosis not present

## 2016-08-25 DIAGNOSIS — F17210 Nicotine dependence, cigarettes, uncomplicated: Secondary | ICD-10-CM | POA: Insufficient documentation

## 2016-08-25 DIAGNOSIS — I509 Heart failure, unspecified: Secondary | ICD-10-CM | POA: Diagnosis not present

## 2016-08-25 DIAGNOSIS — K219 Gastro-esophageal reflux disease without esophagitis: Secondary | ICD-10-CM | POA: Diagnosis not present

## 2016-08-25 DIAGNOSIS — E119 Type 2 diabetes mellitus without complications: Secondary | ICD-10-CM | POA: Diagnosis not present

## 2016-08-25 DIAGNOSIS — I2510 Atherosclerotic heart disease of native coronary artery without angina pectoris: Secondary | ICD-10-CM | POA: Diagnosis not present

## 2016-08-25 LAB — PROTIME-INR
INR: 0.98
Prothrombin Time: 13 seconds (ref 11.4–15.2)

## 2016-08-25 LAB — CBC WITH DIFFERENTIAL/PLATELET
BASOS ABS: 0 10*3/uL (ref 0.0–0.1)
BASOS PCT: 0 %
EOS ABS: 0.2 10*3/uL (ref 0.0–0.7)
Eosinophils Relative: 3 %
HCT: 36.1 % — ABNORMAL LOW (ref 39.0–52.0)
Hemoglobin: 11.5 g/dL — ABNORMAL LOW (ref 13.0–17.0)
LYMPHS ABS: 0.9 10*3/uL (ref 0.7–4.0)
Lymphocytes Relative: 17 %
MCH: 27.6 pg (ref 26.0–34.0)
MCHC: 31.9 g/dL (ref 30.0–36.0)
MCV: 86.6 fL (ref 78.0–100.0)
MONO ABS: 0.7 10*3/uL (ref 0.1–1.0)
Monocytes Relative: 13 %
NEUTROS ABS: 3.2 10*3/uL (ref 1.7–7.7)
Neutrophils Relative %: 67 %
PLATELETS: 108 10*3/uL — AB (ref 150–400)
RBC: 4.17 MIL/uL — ABNORMAL LOW (ref 4.22–5.81)
RDW: 16.1 % — AB (ref 11.5–15.5)
WBC: 5 10*3/uL (ref 4.0–10.5)

## 2016-08-25 LAB — COMPREHENSIVE METABOLIC PANEL
ALBUMIN: 3.6 g/dL (ref 3.5–5.0)
ALT: 11 U/L — ABNORMAL LOW (ref 17–63)
ANION GAP: 5 (ref 5–15)
AST: 22 U/L (ref 15–41)
Alkaline Phosphatase: 186 U/L — ABNORMAL HIGH (ref 38–126)
BILIRUBIN TOTAL: 0.9 mg/dL (ref 0.3–1.2)
BUN: 14 mg/dL (ref 6–20)
CHLORIDE: 106 mmol/L (ref 101–111)
CO2: 28 mmol/L (ref 22–32)
Calcium: 9.1 mg/dL (ref 8.9–10.3)
Creatinine, Ser: 1.01 mg/dL (ref 0.61–1.24)
GFR calc Af Amer: >60 mL/min (ref >60)
GFR calc non Af Amer: >60 mL/min (ref >60)
GLUCOSE: 115 mg/dL — AB (ref 65–99)
POTASSIUM: 4.2 mmol/L (ref 3.5–5.1)
SODIUM: 139 mmol/L (ref 135–145)
TOTAL PROTEIN: 7 g/dL (ref 6.5–8.1)

## 2016-08-25 LAB — GLUCOSE, CAPILLARY: GLUCOSE-CAPILLARY: 110 mg/dL — AB (ref 65–99)

## 2016-08-25 MED ORDER — NALOXONE HCL 0.4 MG/ML IJ SOLN
INTRAMUSCULAR | Status: AC
  Filled 2016-08-25: qty 1

## 2016-08-25 MED ORDER — MIDAZOLAM HCL 2 MG/2ML IJ SOLN
INTRAMUSCULAR | Status: AC
  Filled 2016-08-25: qty 2

## 2016-08-25 MED ORDER — FENTANYL CITRATE (PF) 100 MCG/2ML IJ SOLN
INTRAMUSCULAR | Status: AC | PRN
  Administered 2016-08-25: 25 ug via INTRAVENOUS
  Administered 2016-08-25: 50 ug via INTRAVENOUS

## 2016-08-25 MED ORDER — FLUMAZENIL 0.5 MG/5ML IV SOLN
INTRAVENOUS | Status: DC
Start: 2016-08-25 — End: 2016-08-25
  Filled 2016-08-25: qty 5

## 2016-08-25 MED ORDER — FENTANYL CITRATE (PF) 100 MCG/2ML IJ SOLN
INTRAMUSCULAR | Status: AC
  Filled 2016-08-25: qty 2

## 2016-08-25 MED ORDER — SODIUM CHLORIDE 0.9 % IV SOLN
INTRAVENOUS | Status: DC
  Administered 2016-08-25: 12:00:00 via INTRAVENOUS

## 2016-08-25 MED ORDER — MIDAZOLAM HCL 2 MG/2ML IJ SOLN
INTRAMUSCULAR | Status: AC | PRN
  Administered 2016-08-25: 0.5 mg via INTRAVENOUS
  Administered 2016-08-25: 1 mg via INTRAVENOUS

## 2016-08-25 NOTE — Procedures (Signed)
Liver mass  S/p Rt liver mass [place] BX  No comp Path pending EBL 0 Full report in PACS

## 2016-08-25 NOTE — H&P (Signed)
Chief Complaint: liver lesion  Referring Physician:Dr. Izola Price. Walter French  Supervising Physician: Daryll Brod  Patient Status:  Out-pt  HPI: Walter French is an 70 y.o. male with a history of gastroesophageal cancer.  He recently had a CT scan that revealed a lesion in his liver that has increased in size and appears to be a met.  He has requested that a biopsy be obtained to confirm metastatic disease.  He admits to chronic SOB all of his life, but no other complaints at this time.  Past Medical History:  Past Medical History:  Diagnosis Date  . A-fib ([place])    a. 11/09/2015: successful DCCV  . Allergy   . Anemia   . Anxiety   . Asthma    "small touch" (07/29/2013)  . Blood transfusion without reported diagnosis   . CHF (congestive heart failure) ([place])   . CHF (congestive heart failure) ([place])   . Coronary heart disease    a. s/p CABG x 3 (VG->PDA, VG->PL, LIMA->LAD);  b. 2013 Abnl Myoview;  c. 2013 Cath: 3/3 patent grafts, occluded LCX which correlated w/ ischemia on MV->not amenable to PCI->Med Rx.  . GERD (gastroesophageal reflux disease)   . Gout   . H/O hiatal hernia   . History of blood transfusion   . History of pneumonia    "3-4 times; last time ~ 2003" (07/29/2013)  . Hyperlipidemia   . Hyperlipidemia   . Hypertension   . Implantable cardioverter-defibrillator Mdt   . Obesity   . OSA on CPAP    not  using  . PAD (peripheral artery disease) ([place])    Angiography in February of 2014: Moderate left iliac artery stenosis, mild to moderate diffuse right SFA disease. Severe proximal right popliteal artery stenosis with three-vessel runoff below the knee. Status post self-expanding stent placement to the proximal popliteal artery  . PAD (peripheral artery disease) ([place])   . S/P radiation therapy 08/12/15-09/24/15   Ge  junction 54 Gy  . Shortness of breath dyspnea    "has been sob all my life"  . Sleep apnea   . Stomach cancer ([place]) 07/23/2015   invasive  adenocarcinoma ,esophagus ,distal   . SVT (supraventricular tachycardia) ([place])   . Type II diabetes mellitus ([facility])    no longer on insulin  . VT (ventricular tachycardia) ([place])     Past Surgical History:  Past Surgical History:  Procedure Laterality Date  . ABDOMINAL AORTAGRAM N/A 01/30/2013   Procedure: ABDOMINAL Maxcine Ham;  Surgeon: Wellington Hampshire, MD;  Location: [facility] CATH LAB;  Service: Cardiovascular;  Laterality: N/A;  . CARDIAC CATHETERIZATION    . CARDIAC CATHETERIZATION N/A 07/22/2015   Procedure: Left Heart Cath and Coronary Angiography;  Surgeon: Peter M Martinique, MD;  Location: [facility] CV LAB;  Service: Cardiovascular;  Laterality: N/A;  . CARDIAC [place]; ~ 2003  . CARDIOVERSION N/A 11/09/2015   Procedure: CARDIOVERSION;  Surgeon: Pixie Casino, MD;  Location: [facility] ENDOSCOPY;  Service: Cardiovascular;  Laterality: N/A;  . CATARACT EXTRACTION W/ INTRAOCULAR LENS IMPLANT Left   . CORONARY ARTERY BYPASS GRAFT  1997; 2007   "X3" (07/29/2013)  . ESOPHAGOGASTRODUODENOSCOPY N/A 07/23/2015   Procedure: ESOPHAGOGASTRODUODENOSCOPY (EGD);  Surgeon: Wonda Horner, MD;  Location[facility] ENDOSCOPY;  Service: Endoscopy;  Laterality: N/A;  . ESOPHAGOGASTRODUODENOSCOPY N/A 02/18/2016   Procedure: ESOPHAGOGASTRODUODENOSCOPY (EGD);  Surgeon: Ronald Lobo, MD;  Location: [facility] ENDOSCOPY;  Service: Endoscopy;  Laterality: N/A;  . ESOPHAGOGASTRODUODENOSCOPY (EGD) WITH PROPOFOL N/A  04/05/2016   Procedure: ESOPHAGOGASTRODUODENOSCOPY (EGD) WITH PROPOFOL;  Surgeon: Clarene Essex, MD;  Location: [facility] ENDOSCOPY;  Service: Endoscopy;  Laterality: N/A;  . ESOPHAGOGASTRODUODENOSCOPY (EGD) WITH PROPOFOL N/A 04/28/2016   Procedure: ESOPHAGOGASTRODUODENOSCOPY (EGD) WITH PROPOFOL;  Surgeon: Clarene Essex, MD;  Location: [facility] ENDOSCOPY;  Service: Endoscopy;  Laterality: N/A;  . HOT HEMOSTASIS N/A 04/05/2016   Procedure: HOT HEMOSTASIS (ARGON PLASMA COAGULATION/BICAP);  Surgeon: Clarene Essex, MD;  Location: [facility]  ENDOSCOPY;  Service: Endoscopy;  Laterality: N/A;  . HOT HEMOSTASIS N/A 04/28/2016   Procedure: HOT HEMOSTASIS (ARGON PLASMA COAGULATION/BICAP);  Surgeon: Clarene Essex, MD;  Location: [facility] ENDOSCOPY;  Service: Endoscopy;  Laterality: N/A;  . NERVE, TENDON AND ARTERY REPAIR Left 09/20/2013   Procedure: IRRIGATION AND DEBRIDEMENT,Exploration and Repair of Ulnar/Digital  Artery Nerve of Left Index finger;  Surgeon: Roseanne Kaufman, MD;  Location: [facility];  Service: Orthopedics;  Laterality: Left;  . TEE WITHOUT CARDIOVERSION N/A 10/06/2015   Procedure: TRANSESOPHAGEAL ECHOCARDIOGRAM (TEE);  Surgeon: Pixie Casino, MD;  Location: [facility] ENDOSCOPY;  Service: Cardiovascular;  Laterality: N/A;  . TONSILLECTOMY    . WRIST FRACTURE SURGERY Right     Family History:  Family History  Problem Relation Age of Onset  . Heart disease Brother   . Cancer Brother   . Breast cancer Mother     Social History:  reports that he quit smoking about 20 years ago. His smoking use included Cigarettes. He has a 60.00 pack-year smoking history. He has never used smokeless tobacco. He reports that he does not drink alcohol or use drugs.  Allergies:  Allergies  Allergen Reactions  . Adhesive [Tape] Other (See Comments)    Pulls skin off / please use paper tape  . Levaquin [Levofloxacin In D5w] Nausea And Vomiting  . Morphine And Related Nausea And Vomiting    Medications: Medications reviewed in Epic  Please HPI for pertinent positives, otherwise complete 10 system ROS negative.  Mallampati Score: MD Evaluation Airway: WNL Heart: WNL Abdomen: WNL Chest/ Lungs: WNL ASA  Classification: 3 Mallampati/Airway Score: Two  Physical Exam: BP (!) 153/79 (BP Location: Right Arm)   Pulse 80   Temp 97.4 F (36.3 C) (Oral)   Resp 18   SpO2 98%  There is no height or weight on file to calculate BMI. General: pleasant, WD, WN white male who is laying in bed in NAD HEENT: head is normocephalic, atraumatic.  Sclera are  noninjected. Disconjugate gaze. Ears and nose without any masses or lesions.  Mouth is pink and moist Heart: regular, rate, and rhythm.  Normal s1,s2. No obvious murmurs, gallops, or rubs noted.  Palpable radial and pedal pulses bilaterally Lungs: CTAB, no wheezes, rhonchi, or rales noted.  Respiratory effort nonlabored Abd: soft, NT, ND, +BS, no masses, hernias, or organomegaly Psych: A&Ox3 with an appropriate affect.   Labs: Results for orders placed or performed during the hospital encounter of 08/25/16 (from the past 48 hour(s))  CBC with Differential/Platelet     Status: Abnormal   Collection Time: 08/25/16 11:25 AM  Result Value Ref Range   WBC 5.0 4.0 - 10.5 K/uL   RBC 4.17 (L) 4.22 - 5.81 MIL/uL   Hemoglobin 11.5 (L) 13.0 - 17.0 g/dL   HCT 36.1 (L) 39.0 - 52.0 %   MCV 86.6 78.0 - 100.0 fL   MCH 27.6 26.0 - 34.0 pg   MCHC 31.9 30.0 - 36.0 g/dL   RDW 16.1 (H) 11.5 - 15.5 %   Platelets 108 (L) 150 -  400 K/uL    Comment: SPECIMEN CHECKED FOR CLOTS PLATELET COUNT CONFIRMED BY SMEAR    Neutrophils Relative % 67 %   Lymphocytes Relative 17 %   Monocytes Relative 13 %   Eosinophils Relative 3 %   Basophils Relative 0 %   Neutro Abs 3.2 1.7 - 7.7 K/uL   Lymphs Abs 0.9 0.7 - 4.0 K/uL   Monocytes Absolute 0.7 0.1 - 1.0 K/uL   Eosinophils Absolute 0.2 0.0 - 0.7 K/uL   Basophils Absolute 0.0 0.0 - 0.1 K/uL  Comprehensive metabolic panel     Status: Abnormal   Collection Time: 08/25/16 11:25 AM  Result Value Ref Range   Sodium 139 135 - 145 mmol/L   Potassium 4.2 3.5 - 5.1 mmol/L   Chloride 106 101 - 111 mmol/L   CO2 28 22 - 32 mmol/L   Glucose, Bld 115 (H) 65 - 99 mg/dL   BUN 14 6 - 20 mg/dL   Creatinine, Ser 1.01 0.61 - 1.24 mg/dL   Calcium 9.1 8.9 - 10.3 mg/dL   Total Protein 7.0 6.5 - 8.1 g/dL   Albumin 3.6 3.5 - 5.0 g/dL   AST 22 15 - 41 U/L   ALT 11 (L) 17 - 63 U/L   Alkaline Phosphatase 186 (H) 38 - 126 U/L   Total Bilirubin 0.9 0.3 - 1.2 mg/dL   GFR calc non Af  Amer >60 >60 mL/min   GFR calc Af Amer >60 >60 mL/min    Comment: (NOTE) The eGFR has been calculated using the CKD EPI equation. This calculation has not been validated in all clinical situations. eGFR's persistently <60 mL/min signify possible Chronic Kidney Disease.    Anion gap 5 5 - 15  Protime-INR     Status: None   Collection Time: 08/25/16 11:25 AM  Result Value Ref Range   Prothrombin Time 13.0 11.4 - 15.2 seconds   INR 0.98     Imaging: No results found.  Assessment/Plan 1. Gastroesophageal cancer, liver lesion -we will plan to proceed with an US guided liver lesion bx today -labs and vitals have been reviewed -Risks and Benefits discussed with the patient including, but not limited to bleeding, infection, damage to adjacent structures or low yield requiring additional tests. All of the patient's questions were answered, patient is agreeable to proceed. Consent signed and in chart.  Thank you for this interesting consult.  I greatly enjoyed meeting Walter French and look forward to participating in their care.  A copy of this report was sent to the requesting provider on this date.  Electronically Signed: Henreitta Cea 08/25/2016, 12:34 PM   I spent a total of  30 Minutes   in face to face in clinical consultation, greater than 50% of which was counseling/coordinating care for liver lesion, hx of gastroesophageal cancer

## 2016-08-25 NOTE — Discharge Instructions (Signed)
Moderate Conscious Sedation, Adult, Care After Refer to this sheet in the next few weeks. These instructions provide you with information on caring for yourself after your procedure. Your health care provider may also give you more specific instructions. Your treatment has been planned according to current medical practices, but problems sometimes occur. Call your health care provider if you have any problems or questions after your procedure. WHAT TO EXPECT AFTER THE PROCEDURE  After your procedure:  You may feel sleepy, clumsy, and have poor balance for several hours.  Vomiting may occur if you eat too soon after the procedure. HOME CARE INSTRUCTIONS  Do not participate in any activities where you could become injured for at least 24 hours. Do not:  Drive.  Swim.  Ride a bicycle.  Operate heavy machinery.  Cook.  Use power tools.  Climb ladders.  Work from a high place.  Do not make important decisions or sign legal documents until you are improved.  If you vomit, drink water, juice, or soup when you can drink without vomiting. Make sure you have little or no nausea before eating solid foods.  Only take over-the-counter or prescription medicines for pain, discomfort, or fever as directed by your health care provider.  Make sure you and your family fully understand everything about the medicines given to you, including what side effects may occur.  You should not drink alcohol, take sleeping pills, or take medicines that cause drowsiness for at least 24 hours.  If you smoke, do not smoke without supervision.  If you are feeling better, you may resume normal activities 24 hours after you were sedated.  Keep all appointments with your health care provider. SEEK MEDICAL CARE IF:  Your skin is pale or bluish in color.  You continue to feel nauseous or vomit.  Your pain is getting worse and is not helped by medicine.  You have bleeding or swelling.  You are still  sleepy or feeling clumsy after 24 hours. SEEK IMMEDIATE MEDICAL CARE IF:  You develop a rash.  You have difficulty breathing.  You develop any type of allergic problem.  You have a fever. MAKE SURE YOU:  Understand these instructions.  Will watch your condition.  Will get help right away if you are not doing well or get worse.   This information is not intended to replace advice given to you by your health care provider. Make sure you discuss any questions you have with your health care provider.   Document Released: 09/18/2013 Document Revised: 12/19/2014 Document Reviewed: 09/18/2013 Elsevier Interactive Patient Education 2016 Else[employer] Inc[employer]  Liver Biopsy The liver is a large organ in the upper right-hand side of your abdomen. A liver biopsy is a procedure in which a tissue sample is taken from the liver and examined under a microscope. The procedure is done to confirm a suspected problem. There are three types of liver biopsies:  Percutaneous. In this type, an incision is made in your abdomen. The sample is removed through the incision with a needle.  Laparoscopic. In this type, several incisions are made in the abdomen. A tiny camera is passed through one of the incisions to help guide the health care provider. The sample is removed through the other incision or incisions.  Transjugular. In this type, an incision is made in the neck. A tube is passed through the incision to the liver. The sample is removed through the tube with a needle. LET [facility] CARE PROVIDER KNOW ABOUT:  Any  allergies you have.  All medicines you are taking, including vitamins, herbs, eye drops, creams, and over-the-counter medicines.  Previous problems you or members of your family have had with the use of anesthetics.  Any blood disorders you have.  Previous surgeries you have had.  Medical conditions you have.  Possibility of pregnancy, if this applies. RISKS AND  COMPLICATIONS Generally, this is a safe procedure. However, problems can occur and include:  Bleeding.  Infection.  Bruising.  Collapsed lung.  Leak of digestive juices (bile) from the liver or gallbladder.  Problems with heart rhythm.  Pain at the biopsy site or in the right shoulder.  Low blood pressure (hypotension).  Injury to nearby organs or tissues. BEFORE THE PROCEDURE  Your health care provider may do some blood or urine tests. These will help your health care provider learn how well your kidneys and liver are working and how well your blood clots.  Ask your health care provider if you will be able to go home the day of the procedure. Arrange for someone to take you home and stay with you for at least 24 hours.  Do not eat or drink anything after midnight on the night before the procedure or as directed by your health care provider.  Ask your health care provider about:  Changing or stopping your regular medicines. This is especially important if you are taking diabetes medicines or blood thinners.  Taking medicines such as aspirin and ibuprofen. These medicines can thin your blood. Do not take these medicines before your procedure if your health care provider asks you not to. PROCEDURE Regardless of the type of biopsy that will be done, you will have an IV line placed. Through this line, you will receive fluids and medicine to relax you. If you will be having a laparoscopic biopsy, you may also receive medicine through this line to make you sleep during the procedure (general anesthetic). Percutaneous Liver Biopsy  You will positioned on your back, with your right hand over your head.  A health care provider will locate your liver by tapping and pressing on the right side of your abdomen or with the help of an ultrasound machine or CT scan.  An area at the bottom of your last right rib will be numbed.  An incision will be made in the numbed area.  The biopsy  needle will be inserted into the incision.  Several samples of liver tissue will be taken with the biopsy needle. You will be asked to hold your breath as each sample is taken. Laparoscopic Liver Biopsy  You will be positioned on your back.  Several small incisions will be made in your abdomen.  Your doctor will pass a tiny camera through one incision. The camera will allow the liver to be viewed on a TV monitor in the operating room.  Tools will be passed through the other incision or incisions. These tools will be used to remove samples of liver tissue. Transjugular Liver Biopsy  You will be positioned on your back on an X-ray table, with your head turned to your left.  An area on your neck just over your jugular vein will be numbed.  An incision will be made in the numbed area.  A tiny tube will be inserted through the incision. It will be pushed through the jugular vein to a blood vessel in the liver called the hepatic vein.  Dye will be inserted through the tube, and X-rays will be taken. The dye  will make the blood vessels in the liver light up on the X-rays.  The biopsy needle will be pushed through the tube until it reaches the liver.  Samples of liver tissue will be taken with the biopsy needle.  The needle and the tube will be removed. After the samples are obtained, the incision or incisions will be closed. AFTER THE PROCEDURE  You will be taken to a recovery area.  You may have to lie on your right side for 1-2 hours. This will prevent bleeding from the biopsy site.  Your progress will be watched. Your blood pressure, pulse, and the biopsy site will be checked often.  You may have some pain or feel sick. If this happens, tell your health care provider.  As you begin to feel better, you will be offered ice and beverages.  You may be allowed to go home when the medicines have worn off and you can walk, drink, eat, and use the bathroom.   This information is not  intended to replace advice given to you by your health care provider. Make sure you discuss any questions you have with your health care provider.   Document Released: 02/18/2004 Document Revised: 12/19/2014 Document Reviewed: 01/24/2014 Elsevier Interactive Patient Education 2016 Else[employer] Inc[employer]  Liver Biopsy, Care After Refer to this sheet in the next few weeks. These instructions provide you with information on caring for yourself after your procedure. Your health care provider may also give you more specific instructions. Your treatment has been planned according to current medical practices, but problems sometimes occur. Call your health care provider if you have any problems or questions after your procedure. WHAT TO EXPECT AFTER THE PROCEDURE After your procedure, it is typical to have the following:  A small amount of discomfort in the area where the biopsy was done and in the right shoulder or shoulder blade.  A small amount of bruising around the area where the biopsy was done and on the skin over the liver.  Sleepiness and fatigue for the rest of the day. HOME CARE INSTRUCTIONS   Rest at home for 1-2 days or as directed by your health care provider.  Have a friend or family member stay with you for at least 24 hours.  Because of the medicines used during the procedure, you should not do the following things in the first 24 hours:  Drive.  Use machinery.  Be responsible for the care of other people.  Sign legal documents.  Take a bath or shower.  There are many different ways to close and cover an incision, including stitches, skin glue, and adhesive strips. Follow your health care provider's instructions on:  Incision care.  Bandage (dressing) changes and removal.  Incision closure removal.  Do not drink alcohol in the first week.  Do not lift more than 5 pounds or play contact sports for 2 weeks after this test.  Take medicines only as directed by your health  care provider. Do not take medicine containing aspirin or non-steroidal anti-inflammatory medicines such as ibuprofen for 1 week after this test.  It is your responsibility to get your test results. SEEK MEDICAL CARE IF:   You have increased bleeding from an incision that results in more than a small spot of blood.  You have redness, swelling, or increasing pain in any incisions.  You notice a discharge or a bad smell coming from any of your incisions.  You have a fever or chills. SEEK IMMEDIATE MEDICAL CARE IF:  You develop swelling, bloating, or pain in your abdomen.  You become dizzy or faint.  You develop a rash.  You are nauseous or vomit.  You have difficulty breathing, feel short of breath, or feel faint.  You develop chest pain.  You have problems with your speech or vision.  You have trouble balancing or moving your arms or legs.   This information is not intended to replace advice given to you by your health care provider. Make sure you discuss any questions you have with your health care provider.   Document Released: 06/17/2005 Document Revised: 12/19/2014 Document Reviewed: 01/24/2014 Elsevier Interactive Patient Education [employer].

## 2016-08-29 ENCOUNTER — Ambulatory Visit (HOSPITAL_BASED_OUTPATIENT_CLINIC_OR_DEPARTMENT_OTHER): Payer: PPO | Admitting: Nurse Practitioner

## 2016-08-29 ENCOUNTER — Telehealth: Payer: Self-pay | Admitting: Oncology

## 2016-08-29 VITALS — BP 153/71 | HR 101 | Temp 97.6°F | Resp 18 | Ht 72.0 in | Wt 207.0 lb

## 2016-08-29 DIAGNOSIS — D649 Anemia, unspecified: Secondary | ICD-10-CM

## 2016-08-29 DIAGNOSIS — C160 Malignant neoplasm of cardia: Secondary | ICD-10-CM

## 2016-08-29 DIAGNOSIS — M545 Low back pain: Secondary | ICD-10-CM

## 2016-08-29 DIAGNOSIS — G8929 Other chronic pain: Secondary | ICD-10-CM

## 2016-08-29 DIAGNOSIS — E119 Type 2 diabetes mellitus without complications: Secondary | ICD-10-CM

## 2016-08-29 DIAGNOSIS — C787 Secondary malignant neoplasm of liver and intrahepatic bile duct: Secondary | ICD-10-CM

## 2016-08-29 DIAGNOSIS — D696 Thrombocytopenia, unspecified: Secondary | ICD-10-CM | POA: Diagnosis not present

## 2016-08-29 DIAGNOSIS — R0609 Other forms of dyspnea: Secondary | ICD-10-CM

## 2016-08-29 DIAGNOSIS — R1310 Dysphagia, unspecified: Secondary | ICD-10-CM

## 2016-08-29 MED ORDER — LORAZEPAM 0.5 MG PO TABS: 0.5000 mg | ORAL_TABLET | Freq: Every evening | ORAL | 0 refills | Status: DC | PRN

## 2016-08-29 NOTE — Progress Notes (Addendum)
[place] OFFICE PROGRESS NOTE   Diagnosis:  Gastroesophageal cancer  INTERVAL HISTORY:   Walter French returns as scheduled. He continues to have mild dysphagia. Stable exertional dyspnea. No bleeding. Stable chronic back pain. He has intermittent pain at the right lateral abdomen. He thinks this is position related.  Objective:  Vital signs in last 24 hours:  Blood pressure (!) 153/71, pulse (!) 101, temperature 97.6 F (36.4 C), temperature source Oral, resp. rate 18, height 6' (1.829 m), weight 207 lb (93.9 kg), SpO2 98 %.    No exam performed.  Lab Results:  Lab Results  Component Value Date   WBC 5.0 08/25/2016   HGB 11.5 (L) 08/25/2016   HCT 36.1 (L) 08/25/2016   MCV 86.6 08/25/2016   PLT 108 (L) 08/25/2016   NEUTROABS 3.2 08/25/2016    Imaging:  No results found.  Medications: I have reviewed the patient's current medications.  Assessment/Plan: 1. Adenocarcinoma the gastroesophageal junction, status post an endoscopic biopsy of a distal esophagus mass and gastric cardia ulcer on 07/23/2015 ? Staging CT scans consistent withlocally metastatic adenopathy ? PET scan 08/07/2015 with hypermetabolic mass at the distal esophagus/GE junction and a hypermetabolic paraesophageal, gastrohepatic, and portacaval lymph nodes ? Initiation of radiation 08/12/2015 with completion 09/24/2015; cycle 1 Taxol/carboplatin 08/13/2015; Final cycle given 09/22/2015. ? CT and a/pelvis 04/01/2016 with improvement of the esophageal tumor, decreased paraesophageal lymph nodes, abnormal wall thickening of the gastric antrum, new bilobed mass between the aorta and vena cava, ill-defined low-attenuation segment 5 hepatic lesion ? CTs 08/08/2016-stable abdominal lymph nodes, enlarging right liver lesion, persistent thickening at the distal esophagus ? 08/25/2016 biopsy of liver lesion with pathology showing metastatic adenocarcinoma consistent with esophageal primary 2. History  of coronary artery disease 3. Congestive heart failure 4. History of ventricular tachycardia and supraventricular tachycardia 5. Peripheral arterial disease 6. Implantable defibrillator 7. Diabetes 8. Gout 9. Hospitalization from 09/10/15 to 09/15/15 due to Acute Hypoxic Respiratory Failure and Sepsis as well as new onset of Atrial Fibrillation, 10. Hospitalization 09/30/2015 through 10/02/2015 with probable UTI/bronchitis. 11. Hospitalization 10/05/2015 with rapid atrial fibrillation 12. 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 Hospitalization with GI bleed 02/17/2016 through 02/22/2016 status post upper endoscopy 02/18/2016 with the source of bleeding felt to be hemorrhagic gastritis. There was no evidence of residual mass in the distal esophagus. He continues twice daily Protonix. 14. Anemia most likely due to gastrointestinal bleeding. Improved.  Upper endoscopy 04/05/2016 showed nonbleeding esophageal and proximal gastric ulcers. Localized hemorrhagic mucosa with bleeding and stigmata of recent bleeding found in the cardia, gastric fundus, greater curvature of the stomach and lesser curvature of the stomach. Fulguration was performed.  Upper endoscopy 04/28/2016 showed a small hiatal hernia; one distal linear esophageal ulcer with no bleeding and no stigmata of recent bleeding; multiple stigmata of recent bleeding angiodysplastic lesions or telangiectasias from radiation found in the cardia, gastric fundus, greater curvature of the stomach, lesser curvature of the stomach and at the incisura; one nonbleeding cratered gastric ulcer found in the cardia. 15. Thrombocytopenia   Disposition: Walter French appears unchanged. The recent liver biopsy showed metastatic adenocarcinoma consistent with an esophageal primary. Dr. Benay Spice reviewed the pathology report as well as the CT images from 08/08/2016 with Walter French and multiple friends.  Foundation 1 testing has been sent and is pending.  At present he appears to be asymptomatic with regard to the cancer. Dr. Benay Spice recommends continued observation. We discussed treatment on  the FOLFOX regimen at some point in the future. Walter French is interested in initiating treatment sooner rather than later. We referred him for Port-A-Cath placement. He will return for a follow-up visit in approximately 2 weeks to review outstanding data (Foundation 1 testing) and to have further discussion regarding timing of initiation of treatment. He is in agreement with this plan. He will contact the office in the interim with any problems.  Patient seen with Dr. Benay Spice. 25 minutes were spent face-to-face at today's visit with the majority of that time involved in counseling/coordination of care.    Ned Card ANP/GNP-BC   08/29/2016  3:38 PM This was a shared visit with Ned Card. The liver biopsy confirmed metastatic esophagus cancer. We reviewed the CT images and discussed treatment options with Walter French and multiple friends. He appears asymptomatic from esophagus cancer. We are waiting on the results from [facility] 1 testing.Walter French would like to begin systemic therapy since. He will be referred for placement of a Port-A-Cath.  Julieanne Manson, M.D.

## 2016-08-29 NOTE — Telephone Encounter (Signed)
Port appt scheduled. Avs report and appointment schedule given to patient, per 08/29/16 los.

## 2016-08-30 ENCOUNTER — Encounter: Payer: Self-pay | Admitting: *Deleted

## 2016-08-30 NOTE — Progress Notes (Signed)
Oncology Nurse Navigator Documentation  Oncology Nurse Navigator Flowsheets 08/30/2016  Navigator Location [facility]  Navigator Encounter Type Letter/Fax/Email  Patient Visit Type -  Treatment Phase -  Barriers/Navigation Needs -  Education -  Interventions Coordination of Care--path testing on liver biopsy  Referrals -  Education Method -  Support Groups/Services -  Time Spent with Patient -  Email request to [facility] Pathology for Foundation One Testing on following case: Patient: ADD, JEZEWSKI A Collected: 08/25/2016 Client: [facility] Accession: [id number] Received: 08/25/2016 Gasper Sells, MD DOB: 1946/10/12 Age: 70 Gender: M Reported: 08/26/2016 [place] Patient Ph: [phone] MRN #: [id number] [place], [place] [address]  REPORT

## 2016-08-31 ENCOUNTER — Other Ambulatory Visit: Payer: Self-pay | Admitting: Radiology

## 2016-09-01 ENCOUNTER — Other Ambulatory Visit: Payer: Self-pay | Admitting: Physician Assistant

## 2016-09-02 ENCOUNTER — Encounter (HOSPITAL_COMMUNITY): Payer: Self-pay

## 2016-09-02 ENCOUNTER — Ambulatory Visit (HOSPITAL_COMMUNITY)
Admission: RE | Admit: 2016-09-02 | Discharge: 2016-09-02 | Disposition: A | Discharge status: Home / Self Care | Payer: PPO | Source: Ambulatory Visit | Attending: Nurse Practitioner | Admitting: Nurse Practitioner

## 2016-09-02 ENCOUNTER — Other Ambulatory Visit: Payer: Self-pay | Admitting: Nurse Practitioner

## 2016-09-02 ENCOUNTER — Ambulatory Visit (HOSPITAL_COMMUNITY)
Admission: RE | Admit: 2016-09-02 | Discharge: 2016-09-02 | Disposition: A | Discharge status: Home / Self Care | Payer: PPO | Source: Ambulatory Visit | Attending: Oncology | Admitting: Oncology

## 2016-09-02 DIAGNOSIS — G8929 Other chronic pain: Secondary | ICD-10-CM | POA: Insufficient documentation

## 2016-09-02 DIAGNOSIS — Z9221 Personal history of antineoplastic chemotherapy: Secondary | ICD-10-CM | POA: Diagnosis not present

## 2016-09-02 DIAGNOSIS — J45909 Unspecified asthma, uncomplicated: Secondary | ICD-10-CM | POA: Insufficient documentation

## 2016-09-02 DIAGNOSIS — M109 Gout, unspecified: Secondary | ICD-10-CM | POA: Insufficient documentation

## 2016-09-02 DIAGNOSIS — C160 Malignant neoplasm of cardia: Secondary | ICD-10-CM

## 2016-09-02 DIAGNOSIS — D649 Anemia, unspecified: Secondary | ICD-10-CM | POA: Insufficient documentation

## 2016-09-02 DIAGNOSIS — Z923 Personal history of irradiation: Secondary | ICD-10-CM | POA: Diagnosis not present

## 2016-09-02 DIAGNOSIS — E1151 Type 2 diabetes mellitus with diabetic peripheral angiopathy without gangrene: Secondary | ICD-10-CM | POA: Diagnosis not present

## 2016-09-02 DIAGNOSIS — F419 Anxiety disorder, unspecified: Secondary | ICD-10-CM | POA: Insufficient documentation

## 2016-09-02 DIAGNOSIS — I509 Heart failure, unspecified: Secondary | ICD-10-CM | POA: Insufficient documentation

## 2016-09-02 DIAGNOSIS — E785 Hyperlipidemia, unspecified: Secondary | ICD-10-CM | POA: Diagnosis not present

## 2016-09-02 DIAGNOSIS — I110 Hypertensive heart disease with heart failure: Secondary | ICD-10-CM | POA: Insufficient documentation

## 2016-09-02 DIAGNOSIS — C787 Secondary malignant neoplasm of liver and intrahepatic bile duct: Secondary | ICD-10-CM | POA: Diagnosis not present

## 2016-09-02 DIAGNOSIS — G4733 Obstructive sleep apnea (adult) (pediatric): Secondary | ICD-10-CM | POA: Diagnosis not present

## 2016-09-02 DIAGNOSIS — Z85028 Personal history of other malignant neoplasm of stomach: Secondary | ICD-10-CM | POA: Insufficient documentation

## 2016-09-02 DIAGNOSIS — Z5111 Encounter for antineoplastic chemotherapy: Secondary | ICD-10-CM | POA: Diagnosis not present

## 2016-09-02 DIAGNOSIS — M549 Dorsalgia, unspecified: Secondary | ICD-10-CM | POA: Insufficient documentation

## 2016-09-02 DIAGNOSIS — I2510 Atherosclerotic heart disease of native coronary artery without angina pectoris: Secondary | ICD-10-CM | POA: Insufficient documentation

## 2016-09-02 DIAGNOSIS — Z951 Presence of aortocoronary bypass graft: Secondary | ICD-10-CM | POA: Diagnosis not present

## 2016-09-02 DIAGNOSIS — E669 Obesity, unspecified: Secondary | ICD-10-CM | POA: Insufficient documentation

## 2016-09-02 DIAGNOSIS — I4891 Unspecified atrial fibrillation: Secondary | ICD-10-CM | POA: Insufficient documentation

## 2016-09-02 DIAGNOSIS — K219 Gastro-esophageal reflux disease without esophagitis: Secondary | ICD-10-CM | POA: Diagnosis not present

## 2016-09-02 DIAGNOSIS — I471 Supraventricular tachycardia: Secondary | ICD-10-CM | POA: Diagnosis not present

## 2016-09-02 HISTORY — PX: IR GENERIC HISTORICAL: IMG1180011

## 2016-09-02 LAB — BASIC METABOLIC PANEL
Anion gap: 6 (ref 5–15)
BUN: 14 mg/dL (ref 6–20)
CHLORIDE: 106 mmol/L (ref 101–111)
CO2: 27 mmol/L (ref 22–32)
CREATININE: 1.02 mg/dL (ref 0.61–1.24)
Calcium: 9.1 mg/dL (ref 8.9–10.3)
GFR calc non Af Amer: >60 mL/min (ref >60)
Glucose, Bld: 111 mg/dL — ABNORMAL HIGH (ref 65–99)
Potassium: 3.9 mmol/L (ref 3.5–5.1)
SODIUM: 139 mmol/L (ref 135–145)

## 2016-09-02 LAB — CBC WITH DIFFERENTIAL/PLATELET
BASOS PCT: 0 %
Basophils Absolute: 0 10*3/uL (ref 0.0–0.1)
EOS ABS: 0.2 10*3/uL (ref 0.0–0.7)
Eosinophils Relative: 3 %
HCT: 37.1 % — ABNORMAL LOW (ref 39.0–52.0)
HEMOGLOBIN: 12.2 g/dL — AB (ref 13.0–17.0)
LYMPHS ABS: 1 10*3/uL (ref 0.7–4.0)
Lymphocytes Relative: 16 %
MCH: 27.7 pg (ref 26.0–34.0)
MCHC: 32.9 g/dL (ref 30.0–36.0)
MCV: 84.1 fL (ref 78.0–100.0)
Monocytes Absolute: 0.7 10*3/uL (ref 0.1–1.0)
Monocytes Relative: 11 %
NEUTROS PCT: 70 %
Neutro Abs: 4.3 10*3/uL (ref 1.7–7.7)
Platelets: 123 10*3/uL — ABNORMAL LOW (ref 150–400)
RBC: 4.41 MIL/uL (ref 4.22–5.81)
RDW: 15.8 % — ABNORMAL HIGH (ref 11.5–15.5)
WBC: 6.2 10*3/uL (ref 4.0–10.5)

## 2016-09-02 LAB — PROTIME-INR
INR: 1.06
PROTHROMBIN TIME: 13.9 s (ref 11.4–15.2)

## 2016-09-02 LAB — APTT: APTT: 29 s (ref 24–36)

## 2016-09-02 MED ORDER — LIDOCAINE-EPINEPHRINE (PF) 2 %-1:200000 IJ SOLN
INTRAMUSCULAR | Status: AC
  Filled 2016-09-02: qty 20

## 2016-09-02 MED ORDER — LIDOCAINE-EPINEPHRINE (PF) 2 %-1:200000 IJ SOLN
INTRAMUSCULAR | Status: AC | PRN
  Administered 2016-09-02: 20 mL

## 2016-09-02 MED ORDER — CEFAZOLIN SODIUM-DEXTROSE 2-4 GM/100ML-% IV SOLN
2.0000 g | INTRAVENOUS | Status: AC
  Administered 2016-09-02: 2 g via INTRAVENOUS
  Filled 2016-09-02: qty 100

## 2016-09-02 MED ORDER — MIDAZOLAM HCL 2 MG/2ML IJ SOLN
INTRAMUSCULAR | Status: AC | PRN
  Administered 2016-09-02: 0.5 mg via INTRAVENOUS
  Administered 2016-09-02: 1 mg via INTRAVENOUS
  Administered 2016-09-02: 0.5 mg via INTRAVENOUS
  Administered 2016-09-02: 1 mg via INTRAVENOUS

## 2016-09-02 MED ORDER — FLUMAZENIL 0.5 MG/5ML IV SOLN
INTRAVENOUS | Status: AC
  Filled 2016-09-02: qty 5

## 2016-09-02 MED ORDER — SODIUM CHLORIDE 0.9 % IV SOLN
INTRAVENOUS | Status: DC
  Administered 2016-09-02: 13:00:00 via INTRAVENOUS

## 2016-09-02 MED ORDER — HEPARIN SOD (PORK) LOCK FLUSH 100 UNIT/ML IV SOLN
INTRAVENOUS | Status: AC
  Filled 2016-09-02: qty 5

## 2016-09-02 MED ORDER — MIDAZOLAM HCL 2 MG/2ML IJ SOLN
INTRAMUSCULAR | Status: AC
  Filled 2016-09-02: qty 4

## 2016-09-02 MED ORDER — FENTANYL CITRATE (PF) 100 MCG/2ML IJ SOLN
INTRAMUSCULAR | Status: AC | PRN
  Administered 2016-09-02 (×2): 50 ug via INTRAVENOUS

## 2016-09-02 MED ORDER — FENTANYL CITRATE (PF) 100 MCG/2ML IJ SOLN
INTRAMUSCULAR | Status: AC
  Filled 2016-09-02: qty 4

## 2016-09-02 MED ORDER — NALOXONE HCL 0.4 MG/ML IJ SOLN
INTRAMUSCULAR | Status: AC
  Filled 2016-09-02: qty 1

## 2016-09-02 NOTE — Procedures (Signed)
R IJ Port cathter placement with US and fluoroscopy No complication No blood loss. See complete dictation in [facility] PACS.  

## 2016-09-02 NOTE — Discharge Instructions (Signed)
Implanted Port Insertion, Care After °Refer to this sheet in the next few weeks. These instructions provide you with information on caring for yourself after your procedure. Your health care provider may also give you more specific instructions. Your treatment has been planned according to current medical practices, but problems sometimes occur. Call your health care provider if you have any problems or questions after your procedure. °WHAT TO EXPECT AFTER THE PROCEDURE °After your procedure, it is typical to have the following:  °· Discomfort at the port insertion site. Ice packs to the area will help. °· Bruising on the skin over the port. This will subside in 3-4 days. °HOME CARE INSTRUCTIONS °· After your port is placed, you will get a manufacturer's information card. The card has information about your port. Keep this card with you at all times.   °· Know what kind of port you have. There are many types of ports available.   °· Wear a medical alert bracelet in case of an emergency. This can help alert health care workers that you have a port.   °· The port can stay in for as long as your health care provider believes it is necessary.   °· A home health care nurse may give medicines and take care of the port.   °· You or a family member can get special training and directions for giving medicine and taking care of the port at home.   °SEEK MEDICAL CARE IF:  °· Your port does not flush or you are unable to get a blood return.   °· You have a fever or chills. °SEEK IMMEDIATE MEDICAL CARE IF: °· You have new fluid or pus coming from your incision.   °· You notice a bad smell coming from your incision site.   °· You have swelling, pain, or more redness at the incision or port site.   °· You have chest pain or shortness of breath. °  °This information is not intended to replace advice given to you by your health care provider. Make sure you discuss any questions you have with your health care provider. °  °Document  Released: 09/18/2013 Document Revised: 12/03/2013 Document Reviewed: 09/18/2013 °Elsevier Interactive Patient Education ©2016 Elsevier Inc[employer] °Implanted Port Home Guide °An implanted port is a type of central line that is placed under the skin. Central lines are used to provide IV access when treatment or nutrition needs to be given through a person's veins. Implanted ports are used for long-term IV access. An implanted port may be placed because:  °· You need IV medicine that would be irritating to the small veins in your hands or arms.   °· You need long-term IV medicines, such as antibiotics.   °· You need IV nutrition for a long period.   °· You need frequent blood draws for lab tests.   °· You need dialysis.   °Implanted ports are usually placed in the chest area, but they can also be placed in the upper arm, the abdomen, or the leg. An implanted port has two main parts:  °· Reservoir. The reservoir is round and will appear as a small, raised area under your skin. The reservoir is the part where a needle is inserted to give medicines or draw blood.   °· Catheter. The catheter is a thin, flexible tube that extends from the reservoir. The catheter is placed into a large vein. Medicine that is inserted into the reservoir goes into the catheter and then into the vein.   °HOW WILL I CARE FOR MY INCISION SITE? °Do not get the   incision site wet. Bathe or shower as directed by your health care provider.  °HOW IS MY PORT ACCESSED? °Special steps must be taken to access the port:  °· Before the port is accessed, a numbing cream can be placed on the skin. This helps numb the skin over the port site.   °· Your health care provider uses a sterile technique to access the port. °· Your health care provider must put on a mask and sterile gloves. °· The skin over your port is cleaned carefully with an antiseptic and allowed to dry. °· The port is gently pinched between sterile gloves, and a needle is inserted into the  port. °· Only "non-coring" port needles should be used to access the port. Once the port is accessed, a blood return should be checked. This helps ensure that the port is in the vein and is not clogged.   °· If your port needs to remain accessed for a constant infusion, a clear (transparent) bandage will be placed over the needle site. The bandage and needle will need to be changed every week, or as directed by your health care provider.   °· Keep the bandage covering the needle clean and dry. Do not get it wet. Follow your health care provider's instructions on how to take a shower or bath while the port is accessed.   °· If your port does not need to stay accessed, no bandage is needed over the port.   °WHAT IS FLUSHING? °Flushing helps keep the port from getting clogged. Follow your health care provider's instructions on how and when to flush the port. Ports are usually flushed with saline solution or a medicine called heparin. The need for flushing will depend on how the port is used.  °· If the port is used for intermittent medicines or blood draws, the port will need to be flushed:   °· After medicines have been given.   °· After blood has been drawn.   °· As part of routine maintenance.   °· If a constant infusion is running, the port may not need to be flushed.   °HOW LONG WILL MY PORT STAY IMPLANTED? °The port can stay in for as long as your health care provider thinks it is needed. When it is time for the port to come out, surgery will be done to remove it. The procedure is similar to the one performed when the port was put in.  °WHEN SHOULD I SEEK IMMEDIATE MEDICAL CARE? °When you have an implanted port, you should seek immediate medical care if:  °· You notice a bad smell coming from the incision site.   °· You have swelling, redness, or drainage at the incision site.   °· You have more swelling or pain at the port site or the surrounding area.   °· You have a fever that is not controlled with  medicine. °  °This information is not intended to replace advice given to you by your health care provider. Make sure you discuss any questions you have with your health care provider. °  °Document Released: 11/28/2005 Document Revised: 09/18/2013 Document Reviewed: 08/05/2013 °Elsevier Interactive Patient Education ©2016 Elsevier Inc. ° ° °Moderate Conscious Sedation, Adult, Care After °Refer to this sheet in the next few weeks. These instructions provide you with information on caring for yourself after your procedure. Your health care provider may also give you more specific instructions. Your treatment has been planned according to current medical practices, but problems sometimes occur. Call your health care provider if you have any problems or questions   after your procedure. °WHAT TO EXPECT AFTER THE PROCEDURE  °After your procedure: °· You may feel sleepy, clumsy, and have poor balance for several hours. °· Vomiting may occur if you eat too soon after the procedure. °HOME CARE INSTRUCTIONS °· Do not participate in any activities where you could become injured for at least 24 hours. Do not: °¨ Drive. °¨ Swim. °¨ Ride a bicycle. °¨ Operate heavy machinery. °¨ Cook. °¨ Use power tools. °¨ Climb ladders. °¨ Work from a high place. °· Do not make important decisions or sign legal documents until you are improved. °· If you vomit, drink water, juice, or soup when you can drink without vomiting. Make sure you have little or no nausea before eating solid foods. °· Only take over-the-counter or prescription medicines for pain, discomfort, or fever as directed by your health care provider. °· Make sure you and your family fully understand everything about the medicines given to you, including what side effects may occur. °· You should not drink alcohol, take sleeping pills, or take medicines that cause drowsiness for at least 24 hours. °· If you smoke, do not smoke without supervision. °· If you are feeling better, you  may resume normal activities 24 hours after you were sedated. °· Keep all appointments with your health care provider. °SEEK MEDICAL CARE IF: °· Your skin is pale or bluish in color. °· You continue to feel nauseous or vomit. °· Your pain is getting worse and is not helped by medicine. °· You have bleeding or swelling. °· You are still sleepy or feeling clumsy after 24 hours. °SEEK IMMEDIATE MEDICAL CARE IF: °· You develop a rash. °· You have difficulty breathing. °· You develop any type of allergic problem. °· You have a fever. °MAKE SURE YOU: °· Understand these instructions. °· Will watch your condition. °· Will get help right away if you are not doing well or get worse. °  °This information is not intended to replace advice given to you by your health care provider. Make sure you discuss any questions you have with your health care provider. °  °Document Released: 09/18/2013 Document Revised: 12/19/2014 Document Reviewed: 09/18/2013 °Elsevier Interactive Patient Education ©2016 Elsevier Inc. ° °

## 2016-09-02 NOTE — Progress Notes (Signed)
Referring Physician(s): Sherrill,B  Supervising Physician: Arne Cleveland  Patient Status:  Outpatient  Chief Complaint:  "I'm getting a port put in"  Subjective:  Pt familiar to IR service from prior left thoracenteses in 2008 as well as right hepatic mass biopsy on 08/25/16 with pathology yielding metastatic esophageal cancer. He has a history of adenocarcinoma of the GE junction diagnosed in 2016, status post chemoradiation. He presents again today for Port-A-Cath placement for additional planned chemotherapy. He currently denies fever, headache, chest pain, nausea, vomiting or abnormal bleeding. He does have chronic dyspnea/back pain, mild dysphagia, occasional cough and some mild epigastric discomfort.  Past Medical History:  Diagnosis Date  . A-fib ([place])    a. 11/09/2015: successful DCCV  . Allergy   . Anemia   . Anxiety   . Asthma    "small touch" (07/29/2013)  . Blood transfusion without reported diagnosis   . CHF (congestive heart failure) ([place])   . CHF (congestive heart failure) ([place])   . Coronary heart disease    a. s/p CABG x 3 (VG->PDA, VG->PL, LIMA->LAD);  b. 2013 Abnl Myoview;  c. 2013 Cath: 3/3 patent grafts, occluded LCX which correlated w/ ischemia on MV->not amenable to PCI->Med Rx.  . GERD (gastroesophageal reflux disease)   . Gout   . H/O hiatal hernia   . History of blood transfusion   . History of pneumonia    "3-4 times; last time ~ 2003" (07/29/2013)  . Hyperlipidemia   . Hyperlipidemia   . Hypertension   . Implantable cardioverter-defibrillator Mdt   . Obesity   . OSA on CPAP    not  using  . PAD (peripheral artery disease) ([place])    Angiography in February of 2014: Moderate left iliac artery stenosis, mild to moderate diffuse right SFA disease. Severe proximal right popliteal artery stenosis with three-vessel runoff below the knee. Status post self-expanding stent placement to the proximal popliteal artery  . PAD (peripheral artery disease)  ([place])   . S/P radiation therapy 08/12/15-09/24/15   Ge  junction 54 Gy  . Shortness of breath dyspnea    "has been sob all my life"  . Sleep apnea   . Stomach cancer ([place]) 07/23/2015   invasive adenocarcinoma ,esophagus ,distal   . SVT (supraventricular tachycardia) ([place])   . Type II diabetes mellitus ([facility])    no longer on insulin  . VT (ventricular tachycardia) ([facility])     Allergies: Adhesive [tape]; Levaquin [levofloxacin in d5w]; and Morphine and related  Medications: Prior to Admission medications   Medication Sig Start Date End Date Taking? Authorizing Provider  allopurinol (ZYLOPRIM) 300 MG tablet Take 300 mg by mouth daily.     Yes Historical Provider, MD  atorvastatin (LIPITOR) 40 MG tablet take 1 tablet by mouth once daily 06/01/16  Yes Peter M Martinique, MD  carvedilol (COREG) 6.25 MG tablet take 1 tablet by mouth twice a day with food 12/15/15  Yes Peter M Martinique, MD  DIGITEK 125 MCG tablet take 1 tablet by mouth once daily 07/12/16  Yes Peter M Martinique, MD  ferrous sulfate 325 (65 FE) MG EC tablet Take 325 mg by mouth daily with breakfast.   Yes Historical Provider, MD  furosemide (LASIX) 40 MG tablet Take 1 tablet (40 mg total) by mouth daily. 03/03/16  Yes Peter M Martinique, MD  HYDROcodone-acetaminophen (NORCO/VICODIN) 5-325 MG tablet Take 1 tablet by mouth every 6 (six) hours as needed for moderate pain.  02/19/16  Yes Historical Provider, MD  levothyroxine (SYNTHROID, LEVOTHROID) 75 MCG tablet Take 75 mcg by mouth daily before breakfast.  12/06/14  Yes Historical Provider, MD  lisinopril (PRINIVIL,ZESTRIL) 5 MG tablet Take 1 tablet (5 mg total) by mouth daily. 03/03/16  Yes Peter M Martinique, MD  potassium chloride SA (K-DUR,KLOR-CON) 20 MEQ tablet Take 1 tablet (20 mEq total) by mouth daily. 10/10/15  Yes Brett Canales, PA-C  ranitidine (ZANTAC) 300 MG tablet take 1 tablet by mouth once daily if needed for HEARTBURN 08/25/16  Yes Peter M Martinique, MD  RaNITidine HCl (ZANTAC PO) Take 1 tablet  by mouth daily.    Yes Historical Provider, MD  LORazepam (ATIVAN) 0.5 MG tablet Take 1 tablet (0.5 mg total) by mouth at bedtime as needed for anxiety. 08/29/16   Owens Shark, NP  nitroGLYCERIN (NITROSTAT) 0.4 MG SL tablet Place 1 tablet (0.4 mg total) under the tongue every 5 (five) minutes x 3 doses as needed for chest pain. 10/03/14   Peter M Martinique, MD     Vital Signs: BP 140/82 (BP Location: Right Arm)   Pulse 86   Temp 97.9 F (36.6 C) (Oral)   Resp 18   SpO2 97%   Physical Exam patient awake, alert. Disconjugate gaze; Chest with slightly diminished breath sounds at bases. Left chest wall ICD; Heart with regular rate and rhythm. Abdomen obese, soft, positive bowel sounds, mild epigastric tenderness. Lower extremities with no significant edema  Imaging: No results found.  Labs:  CBC:  Recent Labs  07/07/16 0752 07/21/16 1249 08/08/16 0902 08/25/16 1125  WBC 4.2 4.6 5.3 5.0  HGB 11.1* 11.1* 12.7* 11.5*  HCT 35.0* 35.4* 40.0 36.1*  PLT 104* 109* 100* 108*    COAGS:  Recent Labs  09/10/15 2125 10/01/15 0245 02/17/16 1742 08/25/16 1125  INR 1.06 1.54* 1.34 0.98  APTT 27 38*  --   --     BMP:  Recent Labs  02/17/16 1742 02/19/16 0556 03/02/16 1115 03/10/16 1632 03/18/16 0747 08/08/16 0902 08/25/16 1125  NA 141 141 140 140 135 141 139  K 4.0 3.9 4.3 4.0 3.5 4.3 4.2  CL 110 111 107 109 103  --  106  CO2 23 23 24 25 22 26 28   GLUCOSE 113* 171* 114* 104* 129* 117 115*  BUN 15 11 10 14 14  11.1 14  CALCIUM 9.1 8.8* 8.8 8.9 8.3* 9.5 9.1  CREATININE 1.09 0.97 0.94 1.08 0.86 1.0 1.01  GFRNONAA >60 >60  --  >60  --   --  >60  GFRAA >60 >60  --  >60  --   --  >60    LIVER FUNCTION TESTS:  Recent Labs  11/12/15 0344 02/17/16 1742 03/10/16 1632 08/25/16 1125  BILITOT 1.1 0.5 0.9 0.9  AST 49* 22 23 22   ALT 21 8* 8* 11*  ALKPHOS 427* 164* 155* 186*  PROT 7.3 5.7* 6.0* 7.0  ALBUMIN 2.1* 3.0* 3.2* 3.6    Assessment and Plan: Patient with history  of adenocarcinoma of the gastroesophageal junction diagnosed 2016, status post chemoradiation. Now with biopsy-proven metastatic disease to liver . Plan today is for Port-A-Cath placement for additional chemotherapy.Risks and benefits discussed with the patient including, but not limited to bleeding, infection, pneumothorax, or fibrin sheath development and need for additional procedures.All of the patient's questions were answered, patient is agreeable to proceed.Consent signed and in chart.     Electronically Signed: D. Rowe Robert 09/02/2016, 12:52 PM   I spent a total of 25 minutes  at the the patient's bedside AND on the patient's hospital floor or unit, greater than 50% of which was counseling/coordinating care for port a cath placement    Patient ID: Walter French, male   DOB: December 19, 1945, 70 y.o.   MRN: [id number]

## 2016-09-09 ENCOUNTER — Encounter: Payer: Self-pay | Admitting: *Deleted

## 2016-09-09 NOTE — Progress Notes (Signed)
Foundation One results received and placed on MD desk. ?

## 2016-09-12 ENCOUNTER — Other Ambulatory Visit: Payer: Self-pay | Admitting: Cardiology

## 2016-09-12 NOTE — Telephone Encounter (Signed)
REFILL 

## 2016-09-13 ENCOUNTER — Encounter: Payer: Self-pay | Admitting: *Deleted

## 2016-09-13 ENCOUNTER — Ambulatory Visit (HOSPITAL_BASED_OUTPATIENT_CLINIC_OR_DEPARTMENT_OTHER): Payer: PPO | Admitting: Oncology

## 2016-09-13 ENCOUNTER — Telehealth: Payer: Self-pay | Admitting: Oncology

## 2016-09-13 VITALS — BP 140/66 | HR 77 | Temp 97.6°F | Resp 17 | Ht 72.0 in | Wt 207.2 lb

## 2016-09-13 DIAGNOSIS — C160 Malignant neoplasm of cardia: Secondary | ICD-10-CM | POA: Diagnosis not present

## 2016-09-13 DIAGNOSIS — E119 Type 2 diabetes mellitus without complications: Secondary | ICD-10-CM

## 2016-09-13 DIAGNOSIS — C787 Secondary malignant neoplasm of liver and intrahepatic bile duct: Secondary | ICD-10-CM | POA: Diagnosis not present

## 2016-09-13 NOTE — Telephone Encounter (Signed)
Gv pt appt for 10/13 @ 1.30.

## 2016-09-13 NOTE — Progress Notes (Signed)
[facility] OFFICE PROGRESS NOTE   Diagnosis: Gastroesophageal carcinoma  INTERVAL HISTORY:   Walter French returns as scheduled. He underwent Port-A-Cath placement 09/02/2016. He reports intermittent dysphagia. No new complaint. He is working. He returns today with several friends to discuss treatment options.  Objective:  Vital signs in last 24 hours:  Blood pressure 140/66, pulse 77, temperature 97.6 F (36.4 C), temperature source Oral, resp. rate 17, height 6' (1.829 m), weight 207 lb 3.2 oz (94 kg), SpO2 99 %.    HEENT: Neck without mass Lymphatics: No cervical or supraclavicular nodes Resp: Inspiratory rales at the left posterior base, no respiratory distress Cardio: Regular rate and rhythm GI: No hepatomegaly, nontender Vascular: No leg edema   Portacath/PICC-without erythema  Lab Results:  Lab Results  Component Value Date   WBC 6.2 09/02/2016   HGB 12.2 (L) 09/02/2016   HCT 37.1 (L) 09/02/2016   MCV 84.1 09/02/2016   PLT 123 (L) 09/02/2016   NEUTROABS 4.3 09/02/2016     Medications: I have reviewed the patient's current medications.  Assessment/Plan: 1. Adenocarcinoma the gastroesophageal junction, status post an endoscopic biopsy of a distal esophagus mass and gastric cardia ulcer on 07/23/2015 ? Staging CT scans consistent withlocally metastatic adenopathy ? PET scan 08/07/2015 with hypermetabolic mass at the distal esophagus/GE junction and a hypermetabolic paraesophageal, gastrohepatic, and portacaval lymph nodes ? Initiation of radiation 08/12/2015 with completion 09/24/2015; cycle 1 Taxol/carboplatin 08/13/2015; Final cycle given 09/22/2015. ? CT and a/pelvis 04/01/2016 with improvement of the esophageal tumor, decreased paraesophageal lymph nodes, abnormal wall thickening of the gastric antrum, new bilobed mass between the aorta and vena cava, ill-defined low-attenuation segment 5 hepatic lesion ? CTs 08/08/2016-stable abdominal lymph  nodes, enlarging right liver lesion, persistent thickening at the distal esophagus ? 08/25/2016 biopsy of liver lesion with pathology showing metastatic adenocarcinoma consistent with esophageal primary ? Foundation 1 testing on the liver biopsy found the tumor to be microsatellite stable with an intermediate tumor mutation burden, no alteration in Her-2 2. History of coronary artery disease 3. Congestive heart failure 4. History of ventricular tachycardia and supraventricular tachycardia 5. Peripheral arterial disease 6. Implantable defibrillator 7. Diabetes 8. Gout 9. Hospitalization from 09/10/15 to 09/15/15 due to Acute Hypoxic Respiratory Failure and Sepsis as well as new onset of Atrial Fibrillation, 10. Hospitalization 09/30/2015 through 10/02/2015 with probable UTI/bronchitis. 11. Hospitalization 10/05/2015 with rapid atrial fibrillation 12. 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 Hospitalization with GI bleed 02/17/2016 through 02/22/2016 status post upper endoscopy 02/18/2016 with the source of bleeding felt to be hemorrhagic gastritis. There was no evidence of residual mass in the distal esophagus. He continues twice daily Protonix. 14. Anemia most likely due to gastrointestinal bleeding. Improved.  Upper endoscopy 04/05/2016 showed nonbleeding esophageal and proximal gastric ulcers. Localized hemorrhagic mucosa with bleeding and stigmata of recent bleeding found in the cardia, gastric fundus, greater curvature of the stomach and lesser curvature of the stomach. Fulguration was performed.  Upper endoscopy 04/28/2016 showed a small hiatal hernia; one distal linear esophageal ulcer with no bleeding and no stigmata of recent bleeding; multiple stigmata of recent bleeding angiodysplastic lesions or telangiectasias from radiation found in the cardia, gastric fundus, greater curvature of the stomach, lesser curvature of the stomach and  at the incisura; one nonbleeding cratered gastric ulcer found in the cardia. 15. Thrombocytopenia    Disposition:  Walter French appears unchanged. We discussed the Foundation 1 results and treatment options. We submitted tumor  for PDL 1 testing. This is pending. The plan is to consider treatment with a PD1 inhibitor if there is significant PDL 1 expression. If the PDL 1 testing returned negative I recommend FOLFOX.  We reviewed the potential toxicities associated with the FOLFOX regimen including the chance for nausea/vomiting, mucositis, diarrhea, alopecia, and hematologic toxicity. We discussed the various types of neuropathy associated with oxaliplatin.  Walter French is undecided on whether to proceed with systemic therapy now. We will wait on the PDL 1 testing. He will return for an office visit and further discussion on 09/23/2016.  Betsy Coder, MD  09/13/2016  9:46 AM

## 2016-09-13 NOTE — Telephone Encounter (Signed)
Gv pt appt for 10/13.

## 2016-09-13 NOTE — Progress Notes (Signed)
Oncology Nurse Navigator Documentation  Oncology Nurse Navigator Flowsheets 09/13/2016  Navigator Location [facility]  Navigator Encounter Type Follow-up Appt  Patient Visit Type MedOnc;Follow-up  Treatment Phase Pre-Tx/Tx Discussion  Barriers/Navigation Needs Education/coordination of care  Education Understanding Cancer/ Treatment Options;Preparing for Upcoming Treatment  Interventions Education Method--provided handouts on FOLFOX regimen and explained if PD-L testing is positive, he may be able to have Keytruda (immunotherapy).  Referrals -  Education Method Verbal;Written;Teach-back  Support Groups/Services -  Acuity Level 2  Time Spent with Patient 43  Sent email to [facility] Pathology requesting PD-L testing on his liver biopsy case of 2017 or his esophagus/stomach case in 2016 (have pathologist decide which is best)

## 2016-09-20 ENCOUNTER — Encounter: Payer: Self-pay | Admitting: *Deleted

## 2016-09-20 NOTE — Progress Notes (Signed)
Spoke with Maudie Mercury in pathology: PDL-1 request was sent to [facility] since they still had his tissue block.

## 2016-09-22 ENCOUNTER — Encounter: Payer: Self-pay | Admitting: *Deleted

## 2016-09-22 ENCOUNTER — Encounter: Payer: PPO | Admitting: *Deleted

## 2016-09-22 ENCOUNTER — Telehealth: Payer: Self-pay | Admitting: Cardiology

## 2016-09-22 ENCOUNTER — Encounter (HOSPITAL_COMMUNITY): Payer: Self-pay

## 2016-09-22 NOTE — Progress Notes (Signed)
Oncology Nurse Navigator Documentation  Oncology Nurse Navigator Flowsheets 09/22/2016  Navigator Location [facility]  Navigator Encounter Type Letter/Fax/Email  Patient Visit Type -  Treatment Phase -  Barriers/Navigation Needs Coordination of Care--PD-1 testing on liver or esophagus biopsy  Education -  Interventions Coordination of Care--email to [facility] path for Dr. Saralyn Pilar to assess if there is adequate tissue for testing on case # [id number] or [phone].  Referrals -  Education Method -  Support Groups/Services -  Acuity -  Time Spent with Patient [facility] report of 10/09 reports inadequate tissue for IHC testing. MD has requested PD-1 testing if tissue is adequate.

## 2016-09-22 NOTE — Telephone Encounter (Signed)
Spoke with pt and reminded pt of remote transmission that is due today. Pt verbalized understanding.   

## 2016-09-23 ENCOUNTER — Ambulatory Visit: Payer: PPO | Admitting: Oncology

## 2016-09-23 ENCOUNTER — Telehealth: Payer: Self-pay | Admitting: *Deleted

## 2016-09-23 ENCOUNTER — Encounter: Payer: Self-pay | Admitting: Cardiology

## 2016-09-23 NOTE — Telephone Encounter (Signed)
Message to schedulers to contact pt with appt for 10/20 @ 2:30.

## 2016-09-23 NOTE — Telephone Encounter (Signed)
Spoke with Dr. Saralyn Pilar, pathologist. He will send the older case, [phone] for PDL-1 testing. Called pt to inform him that PDL-1 test results are not back. We will reschedule appt to discuss result. He voiced understanding. Denies any new issues.

## 2016-09-26 ENCOUNTER — Ambulatory Visit: Payer: PPO | Admitting: Oncology

## 2016-09-26 ENCOUNTER — Other Ambulatory Visit (HOSPITAL_COMMUNITY)
Admission: RE | Admit: 2016-09-26 | Discharge: 2016-09-26 | Disposition: A | Discharge status: Home / Self Care | Payer: PPO | Source: Ambulatory Visit | Attending: Oncology | Admitting: Oncology

## 2016-09-26 DIAGNOSIS — C160 Malignant neoplasm of cardia: Secondary | ICD-10-CM | POA: Insufficient documentation

## 2016-09-27 NOTE — Progress Notes (Signed)
Walter French Date of Birth: 03-31-1946 Medical Record [id number]  History of Present Illness: Walter French is seen  for follow up of CAD, atrial fibrillation, and CHF. He has known CAD. He presented in 1997 with acute MI and refractory VT/VF with severe 3 vessel CAD. He underwent emergent CABG and had an ICD implanted. In 2008 he presented with recurrent angina and graft failure and had repeat CABG by Dr. Prescott Gum with LIMA to the LAD, and sequential SVG to the PDA and PLOM. He subsequently had  PCI of the anastomosis of the LIMA to the LAD. This was a difficult POBA procedure with modest success. In August 2016 he had his last cardiac cath due to chest pain and abnormal Myoview. This showed all grafts were patent and the LIMA anastomosis appeared improved. EF was 30-35% by Echo.   In the summer of 2016 he was diagnosed with adenoCA of the GE junction. He was treated with radiation and chemotherapy. He was admitted in September 2016 with sepsis and new onset Afib with RVR. He was started on anticoagulation with Eliquis and treated with rate control.   He was readmitted with persistent afib and worsening CHF approximately 3 weeks later. He had DCCV but did not hold NSR. He was loaded with amiodarone. In late November 2016 he was admitted with respiratory failure and amiodarone pulmonary toxicity. He did have successful DCCV on 11/09/15 but amiodarone had to be discontinued due to toxicity.   He developed recurrent GI bleeds earlier this year.  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 He was treated with PPI and sulcrafate. His Eliquis was stopped. He has required a total of 18 units of PRBCs.   His other issues include HLD, gout, HTN, VT with ICD in place, OSA, DM and obesity. He has had angiography and stent to the right popliteal artery in Feb. 2014 by Dr. Fletcher Anon. Last evaluation  in February 2016 was stable.  He reports that his claudication now is really fairly mild.   On  follow up today he is mainly concerned with his cancer. He has a new hepatic mass and biopsy confirmed adenoCA. He is being considered for another round of chemo. Currently he is feeling well. Appetite is good and his weight is stable. He has no chest pain or SOB. No palpitations. He is concerned about the number of medications he is taking. Doesn't know if he can go thru chemo again since he got so sick before.    Current Outpatient Prescriptions  Medication Sig Dispense Refill  . allopurinol (ZYLOPRIM) 300 MG tablet Take 300 mg by mouth daily.      . carvedilol (COREG) 6.25 MG tablet take 1 tablet by mouth twice a day with food 60 tablet 9  . DIGITEK 125 MCG tablet take 1 tablet by mouth once daily 30 tablet 2  . ferrous sulfate 325 (65 FE) MG EC tablet Take 325 mg by mouth daily with breakfast.    . furosemide (LASIX) 40 MG tablet Take 1 tablet (40 mg total) by mouth daily. 30 tablet 6  . HYDROcodone-acetaminophen (NORCO/VICODIN) 5-325 MG tablet Take 1 tablet by mouth every 6 (six) hours as needed for moderate pain.   0  . levothyroxine (SYNTHROID, LEVOTHROID) 75 MCG tablet Take 75 mcg by mouth daily before breakfast.   0  . lisinopril (PRINIVIL,ZESTRIL) 5 MG tablet Take 1 tablet (5 mg total) by mouth daily. 30 tablet 6  . LORazepam (ATIVAN) 0.5 MG tablet Take  1 tablet (0.5 mg total) by mouth at bedtime as needed for anxiety. 30 tablet 0  . nitroGLYCERIN (NITROSTAT) 0.4 MG SL tablet Place 1 tablet (0.4 mg total) under the tongue every 5 (five) minutes x 3 doses as needed for chest pain. 25 tablet 3  . potassium chloride SA (K-DUR,KLOR-CON) 20 MEQ tablet Take 1 tablet (20 mEq total) by mouth daily. 30 tablet 11  . ranitidine (ZANTAC) 300 MG tablet take 1 tablet by mouth once daily if needed for HEARTBURN 30 tablet 6   No current facility-administered medications for this visit.     Allergies  Allergen Reactions  . Adhesive [Tape] Other (See Comments)    Pulls skin off / please use paper  tape  . Levaquin [Levofloxacin In D5w] Nausea And Vomiting  . Morphine And Related Nausea And Vomiting    Past Medical History:  Diagnosis Date  . A-fib ([place])    a. 11/09/2015: successful DCCV  . Allergy   . Anemia   . Anxiety   . Asthma    "small touch" (07/29/2013)  . Blood transfusion without reported diagnosis   . CHF (congestive heart failure) ([place])   . CHF (congestive heart failure) ([place])   . Coronary heart disease    a. s/p CABG x 3 (VG->PDA, VG->PL, LIMA->LAD);  b. 2013 Abnl Myoview;  c. 2013 Cath: 3/3 patent grafts, occluded LCX which correlated w/ ischemia on MV->not amenable to PCI->Med Rx.  . GERD (gastroesophageal reflux disease)   . Gout   . H/O hiatal hernia   . History of blood transfusion   . History of pneumonia    "3-4 times; last time ~ 2003" (07/29/2013)  . Hyperlipidemia   . Hyperlipidemia   . Hypertension   . Implantable cardioverter-defibrillator Mdt   . Obesity   . OSA on CPAP    not  using  . PAD (peripheral artery disease) ([place])    Angiography in February of 2014: Moderate left iliac artery stenosis, mild to moderate diffuse right SFA disease. Severe proximal right popliteal artery stenosis with three-vessel runoff below the knee. Status post self-expanding stent placement to the proximal popliteal artery  . PAD (peripheral artery disease) ([place])   . S/P radiation therapy 08/12/15-09/24/15   Ge  junction 54 Gy  . Shortness of breath dyspnea    "has been sob all my life"  . Sleep apnea   . Stomach cancer ([place]) 07/23/2015   invasive adenocarcinoma ,esophagus ,distal   . SVT (supraventricular tachycardia) ([place])   . Type II diabetes mellitus ([facility])    no longer on insulin  . VT (ventricular tachycardia) ([place])     Past Surgical History:  Procedure Laterality Date  . ABDOMINAL AORTAGRAM N/A 01/30/2013   Procedure: ABDOMINAL Maxcine Ham;  Surgeon: Wellington Hampshire, MD;  Location: [facility] CATH LAB;  Service: Cardiovascular;  Laterality: N/A;  . CARDIAC  CATHETERIZATION    . CARDIAC CATHETERIZATION N/A 07/22/2015   Procedure: Left Heart Cath and Coronary Angiography;  Surgeon: Ailen Strauch M Martinique, MD;  Location: [facility] CV LAB;  Service: Cardiovascular;  Laterality: N/A;  . CARDIAC [place]; ~ 2003  . CARDIOVERSION N/A 11/09/2015   Procedure: CARDIOVERSION;  Surgeon: Pixie Casino, MD;  Location: [facility] ENDOSCOPY;  Service: Cardiovascular;  Laterality: N/A;  . CATARACT EXTRACTION W/ INTRAOCULAR LENS IMPLANT Left   . CORONARY ARTERY BYPASS GRAFT  1997; 2007   "X3" (07/29/2013)  . ESOPHAGOGASTRODUODENOSCOPY N/A 07/23/2015   Procedure: ESOPHAGOGASTRODUODENOSCOPY (EGD);  Surgeon: Kenney Houseman  Penelope Coop, MD;  Location: [facility];  Service: Endoscopy;  Laterality: N/A;  . ESOPHAGOGASTRODUODENOSCOPY N/A 02/18/2016   Procedure: ESOPHAGOGASTRODUODENOSCOPY (EGD);  Surgeon: Ronald Lobo, MD;  [facility] ENDOSCOPY;  Service: Endoscopy;  Laterality: N/A;  . ESOPHAGOGASTRODUODENOSCOPY (EGD) WITH PROPOFOL N/A 04/05/2016   Procedure: ESOPHAGOGASTRODUODENOSCOPY (EGD) WITH PROPOFOL;  Surgeon: Clarene Essex, MD;  Location: [facility] ENDOSCOPY;  Service: Endoscopy;  Laterality: N/A;  . ESOPHAGOGASTRODUODENOSCOPY (EGD) WITH PROPOFOL N/A 04/28/2016   Procedure: ESOPHAGOGASTRODUODENOSCOPY (EGD) WITH PROPOFOL;  Surgeon: Clarene Essex, MD;  Location[facility] ENDOSCOPY;  Service: Endoscopy;  Laterality: N/A;  . HOT HEMOSTASIS N/A 04/05/2016   Procedure: HOT HEMOSTASIS (ARGON PLASMA COAGULATION/BICAP);  Surgeon: Clarene Essex, MD;  Location: [facility] ENDOSCOPY;  Service: Endoscopy;  Laterality: N/A;  . HOT HEMOSTASIS N/A 04/28/2016   Procedure: HOT HEMOSTASIS (ARGON PLASMA COAGULATION/BICAP);  Surgeon: Clarene Essex, MD;  Location: [facility] ENDOSCOPY;  Service: Endoscopy;  Laterality: N/A;  . IR GENERIC HISTORICAL  09/02/2016   IR FLUORO GUIDE PORT INSERTION RIGHT 09/02/2016 Arne Cleveland, MD [facility]INTER[facility] RAD  . IR GENERIC HISTORICAL  09/02/2016   IR US GUIDE VASC ACCESS RIGHT 09/02/2016 Arne Cleveland,  MD [facility]  . NERVE, TENDON AND ARTERY REPAIR Left 09/20/2013   Procedure: IRRIGATION AND DEBRIDEMENT,Exploration and Repair of Ulnar/Digital  Artery Nerve of Left Index finger;  Surgeon: Roseanne Kaufman, MD;  Location: [facility];  Service: Orthopedics;  Laterality: Left;  . TEE WITHOUT CARDIOVERSION N/A 10/06/2015   Procedure: TRANSESOPHAGEAL ECHOCARDIOGRAM (TEE);  Surgeon: Pixie Casino, MD;  Location: [facility] ENDOSCOPY;  Service: Cardiovascular;  Laterality: N/A;  . TONSILLECTOMY    . WRIST FRACTURE SURGERY Right     History  Smoking Status  . Former Smoker  . Packs/day: 2.00  . Years: 30.00  . Types: Cigarettes  . Quit date: 12/13/1995  Smokeless Tobacco  . Never Used    History  Alcohol Use No    Comment: 07/29/2013 "not touched any alcohol since 1997"    Family History  Problem Relation Age of Onset  . Heart disease Brother   . Cancer Brother   . Breast cancer Mother     Review of Systems: The review of systems is per the HPI.  All other systems were reviewed and are negative.  Physical Exam: BP (!) 140/56 (BP Location: Right Arm, Patient Position: Sitting, Cuff Size: Normal)   Pulse 87   Ht 6' (1.829 m)   Wt 207 lb 6.4 oz (94.1 kg)   SpO2 98%   BMI 28.13 kg/m  Patient is very pleasant and in no acute distress. Skin is warm and dry. Color is normal.  HEENT is unremarkable. Normocephalic/atraumatic. PERRL. Sclera are nonicteric. Neck is supple. No masses. No JVD. Lungs are clear. Cardiac exam shows a regular rate and rhythm. No gallop. No murmur.  Abdomen is soft. Extremities are without edema. Feet are warm and dry. Gait and ROM are intact. No gross neurologic deficits noted.  LABORATORY DATA: Lab Results  Component Value Date   WBC 6.2 09/02/2016   HGB 12.2 (L) 09/02/2016   HCT 37.1 (L) 09/02/2016   PLT 123 (L) 09/02/2016   GLUCOSE 111 (H) 09/02/2016   CHOL 138 07/30/2013   TRIG 321 (H) 07/30/2013   HDL 25 (L) 07/30/2013   LDLCALC 49 07/30/2013   ALT 11 (L)  08/25/2016   AST 22 08/25/2016   NA 139 09/02/2016   K 3.9 09/02/2016   CL 106 09/02/2016   CREATININE 1.02 09/02/2016   BUN 14 09/02/2016  CO2 27 09/02/2016   TSH 0.588 11/09/2015   INR 1.06 09/02/2016   HGBA1C 5.6 11/12/2015   Labs reviewed from 06/07/16: cholesterol 109, triglycerides 62, HDL 35, LDL 70. Last A1c in August 5.8%.   Assessment / Plan: 1. CAD - will continue with medical management. Last cath in August 2016 showed patent grafts and he is asymptomatic.    2. Atrial fibrillation. S/p DCCV in November 2016. Afib started in setting of sepsis and led to worsening CHF. Intolerant of amiodarone due to pulmonary toxicity. Fortunately maintaining NSR. Recurrent UGI bleeds preclude anticoagulation.   3. Chronic systolic CHF.  Ischemic CM EF 30-35%. Well compensated on current therapy. Continue current lasix. Lisinopril, and Coreg dose. On Digoxin  4. PAD - s/p  popliteal stent. Symptoms improved.  5. VT with underlying ICD - followed by Dr. Caryl Comes.   6. DM - reports good control.  7. Recurrent UGI bleed secondary to radiation induced esophagitis/gastritis. On lifelong PPI and sucralfate per GI. Not a candidate for anticoagulation or anti platelet therapy.  8. 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 We discussed this at length. He wanted my opinion on what to do. I told him my knowledge is limited in this area but my understanding is that his cancer is not curable at this stage. The goal of chemotherapy is to extend his life and keep cancer from progressing as quickly. Will defer to Dr. Benay Spice what the expectation of gain from chemotherapy is. I do think it is likely that he will die from his cancer now. We discussed trying to limit his medication to just the essentials now and for this reason will stop lipitor for now. I think his other therapy is essential for continued CHF management.   I will follow up in 3 months.   Follow up in 4 months.

## 2016-09-28 ENCOUNTER — Ambulatory Visit (INDEPENDENT_AMBULATORY_CARE_PROVIDER_SITE_OTHER): Payer: PPO | Admitting: Cardiology

## 2016-09-28 ENCOUNTER — Encounter: Payer: Self-pay | Admitting: Cardiology

## 2016-09-28 VITALS — BP 140/56 | HR 87 | Ht 72.0 in | Wt 207.4 lb

## 2016-09-28 DIAGNOSIS — C160 Malignant neoplasm of cardia: Secondary | ICD-10-CM

## 2016-09-28 DIAGNOSIS — I480 Paroxysmal atrial fibrillation: Secondary | ICD-10-CM

## 2016-09-28 DIAGNOSIS — I2510 Atherosclerotic heart disease of native coronary artery without angina pectoris: Secondary | ICD-10-CM | POA: Diagnosis not present

## 2016-09-28 DIAGNOSIS — I255 Ischemic cardiomyopathy: Secondary | ICD-10-CM | POA: Diagnosis not present

## 2016-09-28 DIAGNOSIS — I5042 Chronic combined systolic (congestive) and diastolic (congestive) heart failure: Secondary | ICD-10-CM

## 2016-09-28 NOTE — Patient Instructions (Addendum)
Stop taking lipitor (atorvastatin)  Continue your other medication.  I will see you in 3 months.

## 2016-09-30 ENCOUNTER — Other Ambulatory Visit: Payer: Self-pay | Admitting: *Deleted

## 2016-09-30 ENCOUNTER — Ambulatory Visit (HOSPITAL_BASED_OUTPATIENT_CLINIC_OR_DEPARTMENT_OTHER): Payer: PPO | Admitting: Oncology

## 2016-09-30 VITALS — BP 150/70 | HR 107 | Temp 99.2°F | Resp 18 | Ht 72.0 in | Wt 209.8 lb

## 2016-09-30 DIAGNOSIS — M545 Low back pain: Secondary | ICD-10-CM | POA: Diagnosis not present

## 2016-09-30 DIAGNOSIS — C160 Malignant neoplasm of cardia: Secondary | ICD-10-CM

## 2016-09-30 DIAGNOSIS — C787 Secondary malignant neoplasm of liver and intrahepatic bile duct: Secondary | ICD-10-CM

## 2016-09-30 DIAGNOSIS — E119 Type 2 diabetes mellitus without complications: Secondary | ICD-10-CM

## 2016-09-30 DIAGNOSIS — D649 Anemia, unspecified: Secondary | ICD-10-CM | POA: Diagnosis not present

## 2016-09-30 MED ORDER — HYDROCODONE-ACETAMINOPHEN 5-325 MG PO TABS: 1.0000 | ORAL_TABLET | Freq: Four times a day (QID) | ORAL | 0 refills | Status: DC | PRN

## 2016-09-30 MED ORDER — NITROGLYCERIN 0.4 MG SL SUBL: 0.4000 mg | SUBLINGUAL_TABLET | SUBLINGUAL | 3 refills | Status: AC | PRN

## 2016-09-30 MED ORDER — LORAZEPAM 0.5 MG PO TABS: 0.5000 mg | ORAL_TABLET | Freq: Every evening | ORAL | 0 refills | Status: DC | PRN

## 2016-09-30 NOTE — Progress Notes (Signed)
Walter French   Diagnosis: Esophagus cancer  INTERVAL HISTORY:   Walter French returns as scheduled. He reports a poor appetite, but he is eating. He is working. He has intermittent pain in the right back near the flank. He discussed treatment options with Dr. Martinique.  Objective:  Vital signs in last 24 hours:  Blood pressure (!) 150/70, pulse (!) 107, temperature 99.2 F (37.3 C), temperature source Oral, resp. rate 18, height 6' (1.829 m), weight 209 lb 12.8 oz (95.2 kg), SpO2 92 %.    HEENT: Neck without mass Lymphatics: No cervical or supraclavicular nodes Resp: Lungs clear bilaterally Cardio: Regular rate and rhythm GI: No hepatosplenomegaly, nontender Vascular: No leg edema Musculoskeletal: No spine tenderness    Portacath/PICC-without erythema  Medications: I have reviewed the patient's current medications.  Assessment/Plan: 1. Adenocarcinoma the gastroesophageal junction, status post an endoscopic biopsy of a distal esophagus mass and gastric cardia ulcer on 07/23/2015 ? Staging CT scans consistent withlocally metastatic adenopathy ? PET scan 08/07/2015 with hypermetabolic mass at the distal esophagus/GE junction and a hypermetabolic paraesophageal, gastrohepatic, and portacaval lymph nodes ? Initiation of radiation 08/12/2015 with completion 09/24/2015; cycle 1 Taxol/carboplatin 08/13/2015; Final cycle given 09/22/2015. ? CT and a/pelvis 04/01/2016 with improvement of the esophageal tumor, decreased paraesophageal lymph nodes, abnormal wall thickening of the gastric antrum, new bilobed mass between the aorta and vena cava, ill-defined low-attenuation segment 5 hepatic lesion ? CTs 08/08/2016-stable abdominal lymph nodes, enlarging right liver lesion, persistent thickening at the distal esophagus ? 08/25/2016 biopsy of liver lesion with pathology showing metastatic adenocarcinoma consistent with esophageal primary ? Foundation 1 testing  on the liver biopsy found the tumor to be microsatellite stable with an intermediate tumor mutation burden, no alteration in Her-2 2. History of coronary artery disease 3. Congestive heart failure 4. History of ventricular tachycardia and supraventricular tachycardia 5. Peripheral arterial disease 6. Implantable defibrillator 7. Diabetes 8. Gout 9. Hospitalization from 09/10/15 to 09/15/15 due to Acute Hypoxic Respiratory Failure and Sepsis as well as new onset of Atrial Fibrillation, 10. Hospitalization 09/30/2015 through 10/02/2015 with probable UTI/bronchitis. 11. Hospitalization 10/05/2015 with rapid atrial fibrillation 12. 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 Hospitalization with GI bleed 02/17/2016 through 02/22/2016 status post upper endoscopy 02/18/2016 with the source of bleeding felt to be hemorrhagic gastritis. There was no evidence of residual mass in the distal esophagus. He continues twice daily Protonix. 14. Anemia most likely due to gastrointestinal bleeding. Improved.  Upper endoscopy 04/05/2016 showed nonbleeding esophageal and proximal gastric ulcers. Localized hemorrhagic mucosa with bleeding and stigmata of recent bleeding found in the cardia, gastric fundus, greater curvature of the stomach and lesser curvature of the stomach. Fulguration was performed.  Upper endoscopy 04/28/2016 showed a small hiatal hernia; one distal linear esophageal ulcer with no bleeding and no stigmata of recent bleeding; multiple stigmata of recent bleeding angiodysplastic lesions or telangiectasias from radiation found in the cardia, gastric fundus, greater curvature of the stomach, lesser curvature of the stomach and at the incisura; one nonbleeding cratered gastric ulcer found in the cardia. 15. Thrombocytopenia    Disposition:  Walter French appears unchanged. I discussed treatment options at length with Walter French and multiple  friends. He understands no therapy will be curative. I estimated a survival of "months "in the absence of systemic therapy. He understands his lifespan could be prolonged if the tumor response to chemotherapy, but this is not guaranteed. He may be a  candidate for immunotherapy if the tumor progresses beyond chemotherapy. We submitted the tumor for PD1 testing and this is pending.  I reviewed the potential toxicities associated with the FOLFOX chemotherapy regimen. Walter French is reluctant to beginning chemotherapy.  He did not feel well during the chemotherapy/radiation last year. He may decide to begin chemotherapy within the next months.  We decided to follow him with observation for now. He will return for a Port-A-Cath flush on 10/17/2016 and an office visit on 10/28/2016.  Betsy Coder, MD  09/30/2016  3:10 PM

## 2016-10-05 ENCOUNTER — Encounter (HOSPITAL_COMMUNITY): Payer: Self-pay

## 2016-10-06 NOTE — Progress Notes (Signed)
Old EMR Document.

## 2016-10-10 ENCOUNTER — Other Ambulatory Visit: Payer: Self-pay | Admitting: *Deleted

## 2016-10-10 MED ORDER — POTASSIUM CHLORIDE CRYS ER 20 MEQ PO TBCR
20.0000 meq | EXTENDED_RELEASE_TABLET | Freq: Every day | ORAL | 5 refills | Status: DC
Start: 2016-10-10 — End: 2016-12-30

## 2016-10-17 ENCOUNTER — Ambulatory Visit (HOSPITAL_BASED_OUTPATIENT_CLINIC_OR_DEPARTMENT_OTHER): Payer: PPO

## 2016-10-17 VITALS — BP 142/70 | HR 88 | Temp 98.0°F | Resp 18

## 2016-10-17 DIAGNOSIS — Z452 Encounter for adjustment and management of vascular access device: Secondary | ICD-10-CM | POA: Diagnosis not present

## 2016-10-17 DIAGNOSIS — C787 Secondary malignant neoplasm of liver and intrahepatic bile duct: Secondary | ICD-10-CM | POA: Diagnosis not present

## 2016-10-17 DIAGNOSIS — Z95828 Presence of other vascular implants and grafts: Secondary | ICD-10-CM | POA: Insufficient documentation

## 2016-10-17 DIAGNOSIS — C160 Malignant neoplasm of cardia: Secondary | ICD-10-CM | POA: Diagnosis not present

## 2016-10-17 MED ORDER — SODIUM CHLORIDE 0.9% FLUSH
10.0000 mL | INTRAVENOUS | Status: DC | PRN
  Administered 2016-10-17: 10 mL via INTRAVENOUS
  Filled 2016-10-17: qty 10

## 2016-10-17 MED ORDER — HEPARIN SOD (PORK) LOCK FLUSH 100 UNIT/ML IV SOLN
500.0000 [IU] | Freq: Once | INTRAVENOUS | Status: AC | PRN
  Administered 2016-10-17: 500 [IU] via INTRAVENOUS
  Filled 2016-10-17: qty 5

## 2016-10-28 ENCOUNTER — Ambulatory Visit (HOSPITAL_BASED_OUTPATIENT_CLINIC_OR_DEPARTMENT_OTHER): Payer: PPO | Admitting: Nurse Practitioner

## 2016-10-28 ENCOUNTER — Telehealth: Payer: Self-pay | Admitting: Oncology

## 2016-10-28 VITALS — BP 137/85 | HR 94 | Temp 97.6°F | Resp 17 | Ht 72.0 in | Wt 211.6 lb

## 2016-10-28 DIAGNOSIS — I10 Essential (primary) hypertension: Secondary | ICD-10-CM

## 2016-10-28 DIAGNOSIS — C160 Malignant neoplasm of cardia: Secondary | ICD-10-CM | POA: Diagnosis not present

## 2016-10-28 DIAGNOSIS — C787 Secondary malignant neoplasm of liver and intrahepatic bile duct: Secondary | ICD-10-CM | POA: Diagnosis not present

## 2016-10-28 DIAGNOSIS — M545 Low back pain: Secondary | ICD-10-CM

## 2016-10-28 DIAGNOSIS — E119 Type 2 diabetes mellitus without complications: Secondary | ICD-10-CM

## 2016-10-28 DIAGNOSIS — C159 Malignant neoplasm of esophagus, unspecified: Secondary | ICD-10-CM

## 2016-10-28 DIAGNOSIS — D649 Anemia, unspecified: Secondary | ICD-10-CM

## 2016-10-28 NOTE — Telephone Encounter (Signed)
Appointments scheduled per 11/17 LOS. Patient given AVS report and calendars with future scheduled appointments. °

## 2016-10-28 NOTE — Progress Notes (Addendum)
[facility] OFFICE PROGRESS NOTE   Diagnosis: Esophagus cancer   INTERVAL HISTORY:   Walter French returns as scheduled. He reports that he feels "good". He has periodic dysphagia. Bowels are moving. Intermittent mild back pain.  Objective:  Vital signs in last 24 hours:  Blood pressure 137/85, pulse 94, temperature 97.6 F (36.4 C), temperature source Oral, resp. rate 17, height 6' (1.829 m), weight 211 lb 9.6 oz (96 kg), SpO2 94 %.    HEENT: No thrush or ulcers. Resp: Lungs clear bilaterally. Cardio: Regular rate and rhythm. GI: No organomegaly. Vascular: No leg edema.  Port-A-Cath without erythema.  Lab Results:  Lab Results  Component Value Date   WBC 6.2 09/02/2016   HGB 12.2 (L) 09/02/2016   HCT 37.1 (L) 09/02/2016   MCV 84.1 09/02/2016   PLT 123 (L) 09/02/2016   NEUTROABS 4.3 09/02/2016    Imaging:  No results found.  Medications: I have reviewed the patient's current medications.  Assessment/Plan: 1. Adenocarcinoma the gastroesophageal junction, status post an endoscopic biopsy of a distal esophagus mass and gastric cardia ulcer on 07/23/2015 ? Staging CT scans consistent withlocally metastatic adenopathy ? PET scan 08/07/2015 with hypermetabolic mass at the distal esophagus/GE junction and a hypermetabolic paraesophageal, gastrohepatic, and portacaval lymph nodes ? Initiation of radiation 08/12/2015 with completion 09/24/2015; cycle 1 Taxol/carboplatin 08/13/2015; Final cycle given 09/22/2015. ? CT and a/pelvis 04/01/2016 with improvement of the esophageal tumor, decreased paraesophageal lymph nodes, abnormal wall thickening of the gastric antrum, new bilobed mass between the aorta and vena cava, ill-defined low-attenuation segment 5 hepatic lesion ? CTs 08/08/2016-stable abdominal lymph nodes, enlarging right liver lesion, persistent thickening at the distal esophagus ? 08/25/2016 biopsy of liver lesion with pathology showing metastatic  adenocarcinoma consistent with esophageal primary ? Foundation 1 testing on the liver biopsy found the tumor to be microsatellite stable with an intermediate tumor mutation burden, no alteration in Her-2 ? PDL1 0% 2. History of coronary artery disease 3. Congestive heart failure 4. History of ventricular tachycardia and supraventricular tachycardia 5. Peripheral arterial disease 6. Implantable defibrillator 7. Diabetes 8. Gout 9. Hospitalization from 09/10/15 to 09/15/15 due to Acute Hypoxic Respiratory Failure and Sepsis as well as new onset of Atrial Fibrillation, 10. Hospitalization 09/30/2015 through 10/02/2015 with probable UTI/bronchitis. 11. Hospitalization 10/05/2015 with rapid atrial fibrillation 12. 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 Hospitalization with GI bleed 02/17/2016 through 02/22/2016 status post upper endoscopy 02/18/2016 with the source of bleeding felt to be hemorrhagic gastritis. There was no evidence of residual mass in the distal esophagus. He continues twice daily Protonix. 14. Anemia most likely due to gastrointestinal bleeding. Improved.  Upper endoscopy 04/05/2016 showed nonbleeding esophageal and proximal gastric ulcers. Localized hemorrhagic mucosa with bleeding and stigmata of recent bleeding found in the cardia, gastric fundus, greater curvature of the stomach and lesser curvature of the stomach. Fulguration was performed.  Upper endoscopy 04/28/2016 showed a small hiatal hernia; one distal linear esophageal ulcer with no bleeding and no stigmata of recent bleeding; multiple stigmata of recent bleeding angiodysplastic lesions or telangiectasias from radiation found in the cardia, gastric fundus, greater curvature of the stomach, lesser curvature of the stomach and at the incisura; one nonbleeding cratered gastric ulcer found in the cardia. 15. Thrombocytopenia   Disposition: Walter French appears  unchanged. He has decided against proceeding with chemotherapy. He is concerned the chemotherapy will cause significant side effects and impact his quality of life. We are assured  him at today's visit that we would help to manage issues that arise related to the cancer whether or not he takes chemotherapy. At present he feels well and seems to be fairly asymptomatic with regard to the cancer.  He will return for a follow-up visit and Port-A-Cath flush in 6 weeks.  Patient seen with Dr. Benay French.    Walter French ANP/GNP-BC   10/28/2016  11:42 AM This was a shared visit with Walter French. We discussed treatment options with Walter French. He indicated that he does not wish to be treated with chemotherapy. He will return for an office visit in 6 weeks.  Walter French, M.D.

## 2016-10-29 DIAGNOSIS — J3489 Other specified disorders of nose and nasal sinuses: Secondary | ICD-10-CM | POA: Diagnosis not present

## 2016-10-29 DIAGNOSIS — R05 Cough: Secondary | ICD-10-CM | POA: Diagnosis not present

## 2016-11-08 NOTE — Progress Notes (Addendum)
[name]. [name] is here for a six month follow up appointment for cancer of the gastroesophageal junction.  Pain:None Skin:Mild tanning to lower chest, Port-a-cath right chest skin normal Swallowing issues/ swallowing pain:Having periodic mild dysphagia He decided against proceeding with chemotherapy he is concerned that  the chemotherapy will cause significant side effects and impact his quality of life. Reports having a head cold for two weeks  Saw his PCP does not know the name of the antibiotic he is taking. Wt Readings from Last 3 Encounters:  11/09/16 207 lb 6.4 oz (94.1 kg)  10/28/16 211 lb 9.6 oz (96 kg)  09/30/16 209 lb 12.8 oz (95.2 kg)  BP 140/78   Pulse 83   Temp 97.7 F (36.5 C) (Oral)   Resp 18   Ht (!) 6" (0.152 m)   Wt 207 lb 6.4 oz (94.1 kg)   SpO2 100%   BMI 4050.50 kg/m

## 2016-11-09 ENCOUNTER — Ambulatory Visit
Admission: RE | Admit: 2016-11-09 | Discharge: 2016-11-09 | Disposition: A | Discharge status: Home / Self Care | Payer: PPO | Source: Ambulatory Visit | Attending: Radiation Oncology | Admitting: Radiation Oncology

## 2016-11-09 ENCOUNTER — Encounter: Payer: Self-pay | Admitting: Radiation Oncology

## 2016-11-09 ENCOUNTER — Other Ambulatory Visit: Payer: Self-pay | Admitting: Radiation Oncology

## 2016-11-09 VITALS — BP 140/78 | HR 83 | Temp 97.7°F | Resp 18 | Ht <= 58 in | Wt 207.4 lb

## 2016-11-09 DIAGNOSIS — I10 Essential (primary) hypertension: Secondary | ICD-10-CM | POA: Insufficient documentation

## 2016-11-09 DIAGNOSIS — K259 Gastric ulcer, unspecified as acute or chronic, without hemorrhage or perforation: Secondary | ICD-10-CM | POA: Diagnosis not present

## 2016-11-09 DIAGNOSIS — Z803 Family history of malignant neoplasm of breast: Secondary | ICD-10-CM | POA: Insufficient documentation

## 2016-11-09 DIAGNOSIS — M109 Gout, unspecified: Secondary | ICD-10-CM | POA: Diagnosis not present

## 2016-11-09 DIAGNOSIS — Z951 Presence of aortocoronary bypass graft: Secondary | ICD-10-CM | POA: Diagnosis not present

## 2016-11-09 DIAGNOSIS — Z923 Personal history of irradiation: Secondary | ICD-10-CM | POA: Insufficient documentation

## 2016-11-09 DIAGNOSIS — Z79899 Other long term (current) drug therapy: Secondary | ICD-10-CM | POA: Insufficient documentation

## 2016-11-09 DIAGNOSIS — D500 Iron deficiency anemia secondary to blood loss (chronic): Secondary | ICD-10-CM | POA: Diagnosis not present

## 2016-11-09 DIAGNOSIS — G4733 Obstructive sleep apnea (adult) (pediatric): Secondary | ICD-10-CM | POA: Insufficient documentation

## 2016-11-09 DIAGNOSIS — I2510 Atherosclerotic heart disease of native coronary artery without angina pectoris: Secondary | ICD-10-CM | POA: Insufficient documentation

## 2016-11-09 DIAGNOSIS — E785 Hyperlipidemia, unspecified: Secondary | ICD-10-CM | POA: Insufficient documentation

## 2016-11-09 DIAGNOSIS — Z87891 Personal history of nicotine dependence: Secondary | ICD-10-CM | POA: Diagnosis not present

## 2016-11-09 DIAGNOSIS — K219 Gastro-esophageal reflux disease without esophagitis: Secondary | ICD-10-CM | POA: Insufficient documentation

## 2016-11-09 DIAGNOSIS — E119 Type 2 diabetes mellitus without complications: Secondary | ICD-10-CM | POA: Diagnosis not present

## 2016-11-09 DIAGNOSIS — Z881 Allergy status to other antibiotic agents status: Secondary | ICD-10-CM | POA: Insufficient documentation

## 2016-11-09 DIAGNOSIS — Z885 Allergy status to narcotic agent status: Secondary | ICD-10-CM | POA: Insufficient documentation

## 2016-11-09 DIAGNOSIS — C160 Malignant neoplasm of cardia: Secondary | ICD-10-CM | POA: Diagnosis not present

## 2016-11-09 DIAGNOSIS — Z8249 Family history of ischemic heart disease and other diseases of the circulatory system: Secondary | ICD-10-CM | POA: Diagnosis not present

## 2016-11-09 DIAGNOSIS — Z95810 Presence of automatic (implantable) cardiac defibrillator: Secondary | ICD-10-CM | POA: Insufficient documentation

## 2016-11-09 DIAGNOSIS — D649 Anemia, unspecified: Secondary | ICD-10-CM | POA: Diagnosis not present

## 2016-11-09 DIAGNOSIS — C159 Malignant neoplasm of esophagus, unspecified: Secondary | ICD-10-CM

## 2016-11-09 NOTE — Progress Notes (Addendum)
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 The patient then received a 9 gray boost using a 6 field technique. [name] final dose was 54 gray which was given with concurrent chemotherapy.  Narrative:  The patient returns today for routine follow-up.  Of note he has had chronic recurrence of gastric ulcers, and has been under the care of Dr. Benay Spice and Dr. Watt Climes with close follow-up.since the patient's last visit, he has been found to have recurrent disease and has refused systemic chemotherapy. His disease is present in the liver and was confirmed by biopsy in September 2017. He comes today for routine follow up.                        On review of systems, the patient reports that he is doing well overall. He has shortness of breath at baseline and states this is stable for him. He denies any chest pain, cough, fevers, chills, night sweats, unintended weight changes. He denies any bowel or bladder disturbances, and denies abdominal pain, nausea or vomiting. He denies any new musculoskeletal or joint aches or pains. A complete review of systems is obtained and is otherwise negative.   Past Medical History:  Past Medical History:  Diagnosis Date  . A-fib ([place])    a. 11/09/2015: successful DCCV  . Allergy   . Anemia   . Anxiety   . Asthma    "small touch" (07/29/2013)  . Blood transfusion without reported  diagnosis   . CHF (congestive heart failure) ([place])   . CHF (congestive heart failure) ([place])   . Coronary heart disease    a. s/p CABG x 3 (VG->PDA, VG->PL, LIMA->LAD);  b. 2013 Abnl Myoview;  c. 2013 Cath: 3/3 patent grafts, occluded LCX which correlated w/ ischemia on MV->not amenable to PCI->Med Rx.  . GERD (gastroesophageal reflux disease)   . Gout   . H/O hiatal hernia   . History of blood transfusion   . History of pneumonia    "3-4 times; last time ~ 2003" (07/29/2013)  . Hyperlipidemia   . Hyperlipidemia   . Hypertension   . Implantable cardioverter-defibrillator Mdt   . Obesity   . OSA on CPAP    not  using  . PAD (peripheral artery disease) ([place])    Angiography in February of 2014: Moderate left iliac artery stenosis, mild to moderate diffuse right SFA disease. Severe proximal right popliteal artery stenosis with three-vessel runoff below the knee. Status post self-expanding stent placement to the proximal popliteal artery  . PAD (peripheral artery disease) ([place])   . S/P radiation therapy 08/12/15-09/24/15   Ge  junction 54 Gy  . Shortness of breath dyspnea    "has been sob all my life"  . Sleep apnea   . Stomach cancer ([place]) 07/23/2015   invasive adenocarcinoma ,esophagus ,distal   . SVT (supraventricular tachycardia) ([place])   . Type II diabetes  mellitus ([place])    no longer on insulin  . VT (ventricular tachycardia) ([place])     Past Surgical History: Past Surgical History:  Procedure Laterality Date  . ABDOMINAL AORTAGRAM N/A 01/30/2013   Procedure: ABDOMINAL Maxcine Ham;  Surgeon: Wellington Hampshire, MD;  Location: [facility] CATH LAB;  Service: Cardiovascular;  Laterality: N/A;  . CARDIAC CATHETERIZATION    . CARDIAC CATHETERIZATION N/A 07/22/2015   Procedure: Left Heart Cath and Coronary Angiography;  Surgeon: Peter M Martinique, MD;  Location: [facility] CV LAB;  Service: Cardiovascular;  Laterality: N/A;  . CARDIAC [place]; ~ 2003  . CARDIOVERSION N/A  11/09/2015   Procedure: CARDIOVERSION;  Surgeon: Pixie Casino, MD;  Location: [facility] ENDOSCOPY;  Service: Cardiovascular;  Laterality: N/A;  . CATARACT EXTRACTION W/ INTRAOCULAR LENS IMPLANT Left   . CORONARY ARTERY BYPASS GRAFT  1997; 2007   "X3" (07/29/2013)  . ESOPHAGOGASTRODUODENOSCOPY N/A 07/23/2015   Procedure: ESOPHAGOGASTRODUODENOSCOPY (EGD);  Surgeon: Wonda Horner, MD;  Location: [facility] ENDOSCOPY;  Service: Endoscopy;  Laterality: N/A;  . ESOPHAGOGASTRODUODENOSCOPY N/A 02/18/2016   Procedure: ESOPHAGOGASTRODUODENOSCOPY (EGD);  Surgeon: Ronald Lobo, MD;  Location: [facility] ENDOSCOPY;  Service: Endoscopy;  Laterality: N/A;  . ESOPHAGOGASTRODUODENOSCOPY (EGD) WITH PROPOFOL N/A 04/05/2016   Procedure: ESOPHAGOGASTRODUODENOSCOPY (EGD) WITH PROPOFOL;  Surgeon: Clarene Essex, MD;  Location: [facility] ENDOSCOPY;  Service: Endoscopy;  Laterality: N/A;  . ESOPHAGOGASTRODUODENOSCOPY (EGD) WITH PROPOFOL N/A 04/28/2016   Procedure: ESOPHAGOGASTRODUODENOSCOPY (EGD) WITH PROPOFOL;  Surgeon: Clarene Essex, MD;  Location: [facility] ENDOSCOPY;  Service: Endoscopy;  Laterality: N/A;  . HOT HEMOSTASIS N/A 04/05/2016   Procedure: HOT HEMOSTASIS (ARGON PLASMA COAGULATION/BICAP);  Surgeon: Clarene Essex, MD;  Location: [facility] ENDOSCOPY;  Service: Endoscopy;  Laterality: N/A;  . HOT HEMOSTASIS N/A 04/28/2016   Procedure: HOT HEMOSTASIS (ARGON PLASMA COAGULATION/BICAP);  Surgeon: Clarene Essex, MD;  Location: [facility] ENDOSCOPY;  Service: Endoscopy;  Laterality: N/A;  . IR GENERIC HISTORICAL  09/02/2016   IR FLUORO GUIDE PORT INSERTION RIGHT 09/02/2016 Arne Cleveland, MD [facility]-INTERV RAD  . IR GENERIC HISTORICAL  09/02/2016   IR US GUIDE VASC ACCESS RIGHT 09/02/2016 Arne Cleveland, MD [facility]INTER[facility]  . NERVE, TENDON AND ARTERY REPAIR Left 09/20/2013   Procedure: IRRIGATION AND DEBRIDEMENT,Exploration and Repair of Ulnar/Digital  Artery Nerve of Left Index finger;  Surgeon: Roseanne Kaufman, MD;  Location: [facility];  Service: Orthopedics;  Laterality: Left;  . TEE WITHOUT  CARDIOVERSION N/A 10/06/2015   Procedure: TRANSESOPHAGEAL ECHOCARDIOGRAM (TEE);  Surgeon: Pixie Casino, MD;  Location: [facility] ENDOSCOPY;  Service: Cardiovascular;  Laterality: N/A;  . TONSILLECTOMY    . WRIST FRACTURE SURGERY Right     Social History:  Social History   Social History  . Marital status: Single    Spouse name: N/A  . Number of children: 0  . Years of education: N/A   Occupational History  . retired Surveyor, mining   .  Retired   Social History Main Topics  . Smoking status: Former Smoker    Packs/day: 2.00    Years: 30.00    Types: Cigarettes    Quit date: 12/13/1995  . Smokeless tobacco: Never Used  . Alcohol use No     Comment: 07/29/2013 "not touched any alcohol since 1997"  . Drug use: No  . Sexual activity: Not Currently   Other Topics Concern  . Not on file   Social History Narrative   Single, never married   Retired Administrator   Close friend, [name] and her children are his  main support system   Independent in ADLs.  The patient resides in [place].  Family History: Family History  Problem Relation Age of Onset  . Heart disease Brother   . Cancer Brother   . Breast cancer Mother     ALLERGIES:  is allergic to adhesive [tape]; levaquin [levofloxacin in d5w]; and morphine and related.  Meds: Current Outpatient Prescriptions  Medication Sig Dispense Refill  . allopurinol (ZYLOPRIM) 300 MG tablet Take 300 mg by mouth daily.      . carvedilol (COREG) 6.25 MG tablet take 1 tablet by mouth twice a day with food 60 tablet 9  . DIGITEK 125 MCG tablet take 1 tablet by mouth once daily 30 tablet 2  . ferrous sulfate 325 (65 FE) MG EC tablet Take 325 mg by mouth daily with breakfast.    . furosemide (LASIX) 40 MG tablet Take 1 tablet (40 mg total) by mouth daily. 30 tablet 6  . HYDROcodone-acetaminophen (NORCO/VICODIN) 5-325 MG tablet Take 1 tablet by mouth every 6 (six) hours as needed for moderate pain. 30 tablet 0  . levothyroxine  (SYNTHROID, LEVOTHROID) 75 MCG tablet Take 75 mcg by mouth daily before breakfast.   0  . lisinopril (PRINIVIL,ZESTRIL) 5 MG tablet Take 1 tablet (5 mg total) by mouth daily. 30 tablet 6  . LORazepam (ATIVAN) 0.5 MG tablet Take 1 tablet (0.5 mg total) by mouth at bedtime as needed for anxiety. 30 tablet 0  . potassium chloride SA (K-DUR,KLOR-CON) 20 MEQ tablet Take 1 tablet (20 mEq total) by mouth daily. 30 tablet 5  . ranitidine (ZANTAC) 300 MG tablet take 1 tablet by mouth once daily if needed for HEARTBURN 30 tablet 6  . nitroGLYCERIN (NITROSTAT) 0.4 MG SL tablet Place 1 tablet (0.4 mg total) under the tongue every 5 (five) minutes x 3 doses as needed for chest pain. (Patient not taking: Reported on 11/09/2016) 25 tablet 3   No current facility-administered medications for this encounter.     Physical Findings:  height is 6" (0.152 m) (abnormal) and weight is 207 lb 6.4 oz (94.1 kg). His oral temperature is 97.7 F (36.5 C). His blood pressure is 140/78 and his pulse is 83. His respiration is 18 and oxygen saturation is 100%.   In general this is a well appearing Caucasian male in no acute distress. He is alert and oriented x4 and appropriate throughout the examination. HEENT reveals that the patient is normocephalic, atraumatic. EOMs are intact. PERRLA. Skin is intact without any evidence of gross lesions. Cardiovascular exam reveals a regular rate and rhythm, no clicks rubs or murmurs are auscultated. Chest is clear to auscultation bilaterally. Lymphatic assessment is performed and does not reveal any adenopathy in the cervical, supraclavicular, axillary, or inguinal chains. Abdomen has active bowel sounds in all quadrants and is intact. The abdomen is soft, non tender, non distended. Lower extremities are negative for pretibial pitting edema, deep calf tenderness, cyanosis or clubbing.   Lab Findings: Lab Results  Component Value Date   WBC 6.2 09/02/2016   HGB 12.2 (L) 09/02/2016   HCT  37.1 (L) 09/02/2016   MCV 84.1 09/02/2016   PLT 123 (L) 09/02/2016     Radiographic Findings: No results found.  Impression/Plan: 1. At least stage IIA adenocarcinoma of the GE junction with signet ring features with recurrence. The patient is not planning to proceed with systemic therapy, and is going to follow up with Dr. Benay Spice in about 6 weeks. We did discuss the options  to meet with palliative care to outline his goals of care. We would be happy to see him back as needed, as he is not interested in keeping multiple appointments at this time. He will call if he has questions or concerns regarding options for radiotherapy. 2. 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 His hgb is stable currently, and management of this will be deferred to Dr. Benay Spice.   In a visit lasting 25 minutes, greater than 50% of the time was spent face to face discussing palliative care options as he is not interested in pursuing additional treatments, and coordinating the patient's care.      Carola Rhine, PAC

## 2016-11-16 ENCOUNTER — Ambulatory Visit (HOSPITAL_BASED_OUTPATIENT_CLINIC_OR_DEPARTMENT_OTHER): Payer: PPO

## 2016-11-16 ENCOUNTER — Telehealth: Payer: Self-pay | Admitting: Medical Oncology

## 2016-11-16 ENCOUNTER — Telehealth: Payer: Self-pay | Admitting: Oncology

## 2016-11-16 ENCOUNTER — Encounter: Payer: Self-pay | Admitting: Nurse Practitioner

## 2016-11-16 ENCOUNTER — Ambulatory Visit (HOSPITAL_BASED_OUTPATIENT_CLINIC_OR_DEPARTMENT_OTHER): Payer: PPO | Admitting: Nurse Practitioner

## 2016-11-16 ENCOUNTER — Other Ambulatory Visit: Payer: Self-pay | Admitting: *Deleted

## 2016-11-16 VITALS — BP 109/54 | HR 88 | Temp 97.7°F | Resp 18 | Ht 72.0 in | Wt 206.5 lb

## 2016-11-16 DIAGNOSIS — R17 Unspecified jaundice: Secondary | ICD-10-CM

## 2016-11-16 DIAGNOSIS — R1011 Right upper quadrant pain: Secondary | ICD-10-CM

## 2016-11-16 DIAGNOSIS — C787 Secondary malignant neoplasm of liver and intrahepatic bile duct: Secondary | ICD-10-CM | POA: Diagnosis not present

## 2016-11-16 DIAGNOSIS — Z66 Do not resuscitate: Secondary | ICD-10-CM

## 2016-11-16 DIAGNOSIS — C159 Malignant neoplasm of esophagus, unspecified: Secondary | ICD-10-CM

## 2016-11-16 DIAGNOSIS — C160 Malignant neoplasm of cardia: Secondary | ICD-10-CM

## 2016-11-16 DIAGNOSIS — R748 Abnormal levels of other serum enzymes: Secondary | ICD-10-CM

## 2016-11-16 LAB — COMPREHENSIVE METABOLIC PANEL
ALBUMIN: 3.1 g/dL — AB (ref 3.5–5.0)
ALK PHOS: 357 U/L — AB (ref 40–150)
ALT: 26 U/L (ref 0–55)
AST: 46 U/L — ABNORMAL HIGH (ref 5–34)
Anion Gap: 7 mEq/L (ref 3–11)
BILIRUBIN TOTAL: 1.22 mg/dL — AB (ref 0.20–1.20)
BUN: 14.8 mg/dL (ref 7.0–26.0)
CO2: 26 meq/L (ref 22–29)
CREATININE: 1.1 mg/dL (ref 0.7–1.3)
Calcium: 9.2 mg/dL (ref 8.4–10.4)
Chloride: 104 mEq/L (ref 98–109)
EGFR: 71 mL/min/{1.73_m2} — AB (ref >90)
GLUCOSE: 122 mg/dL (ref 70–140)
Potassium: 4.9 mEq/L (ref 3.5–5.1)
SODIUM: 138 meq/L (ref 136–145)
TOTAL PROTEIN: 7.1 g/dL (ref 6.4–8.3)

## 2016-11-16 LAB — CBC WITH DIFFERENTIAL/PLATELET
BASO%: 0.7 % (ref 0.0–2.0)
Basophils Absolute: 0 10*3/uL (ref 0.0–0.1)
EOS ABS: 0.2 10*3/uL (ref 0.0–0.5)
EOS%: 2.5 % (ref 0.0–7.0)
HEMATOCRIT: 38.6 % (ref 38.4–49.9)
HEMOGLOBIN: 12.4 g/dL — AB (ref 13.0–17.1)
LYMPH#: 0.9 10*3/uL (ref 0.9–3.3)
LYMPH%: 11.8 % — ABNORMAL LOW (ref 14.0–49.0)
MCH: 28.2 pg (ref 27.2–33.4)
MCHC: 32.2 g/dL (ref 32.0–36.0)
MCV: 87.6 fL (ref 79.3–98.0)
MONO#: 0.9 10*3/uL (ref 0.1–0.9)
MONO%: 12.5 % (ref 0.0–14.0)
NEUT%: 72.5 % (ref 39.0–75.0)
NEUTROS ABS: 5.4 10*3/uL (ref 1.5–6.5)
PLATELETS: 120 10*3/uL — AB (ref 140–400)
RBC: 4.41 10*6/uL (ref 4.20–5.82)
RDW: 16 % — AB (ref 11.0–14.6)
WBC: 7.4 10*3/uL (ref 4.0–10.3)

## 2016-11-16 MED ORDER — OXYCODONE HCL 5 MG PO TABS: ORAL_TABLET | ORAL | 0 refills | Status: DC

## 2016-11-16 NOTE — Assessment & Plan Note (Signed)
Patient is status post both chemotherapy and radiation treatments in the past; is currently undergoing observation only.  He presented to the [facility] today with acute onset right upper quadrant pain.  He states the pain is so bad that he dropped his knees at one point.  He states the pain was also so bad at one point that he actually vomited.  He denies any actual nausea, however.  He also denies any UTI symptoms whatsoever.  He denies any recent fevers or chills.  Patient states that he usually requires only occasional hydrocodone for his pain control; but has taken 3 Vicodin at a time; with his pain scale still at a 7 out of 10.  Exam today reveals bowel sounds positive in all 4 quads.  Patient has trace tenderness to the right upper quadrant with palpation.  Patient appears nontoxic.  Labs obtained today revealed bilirubin has increased to 1.22 and alkaline phosphatase has increased from 186 up to 357.  Vital signs were stable and patient was afebrile today.  Reviewed all findings with Dr. Benay Spice; he recommended a restaging CT to be obtained as soon as possible for further evaluation.  Patient will be scheduled for a CT with contrast of the chest/abdomen/pelvis for tomorrow morning at 7:30.  He was given the contrast to drink prior to his CT scan tomorrow morning.  He was also instructed to remain nothing by mouth other than the contrast in the morning for 4 hours prior to the CT scan.  Patient will return to the cancer surgery his scan results after his CT scan in the morning.  Patient was given oxycodone and instructed to take 1-2 of the tablets every 6 hours as needed for pain.  He was advised to hold any further hydrocodone.  In the meantime-patient was advised to call/return or go directly to the emergency department for any worsening symptoms whatsoever.

## 2016-11-16 NOTE — Telephone Encounter (Signed)
Pt left a message that  he is having "unbearable pain on my right side" . I called him back and he said 'it started an hour ago , I was just sitting at desk and this sharp pain comes and goes. "  He stopped the pain med from Dr Clovia Cuff. He cannot get the rx from Dr Benay Spice that is waiting for him at pharmacy until next week. He has not taken any pain med today so I told him to go home and take his pain med. And I will call him back.

## 2016-11-16 NOTE — Telephone Encounter (Signed)
Per Dr Benay Spice pt can come in and be seen -I told pt to come in at 130 p for labs and 2pm for [facility]. Pt said He called pharmacy and he can get his pain med today from Dr Benay Spice .

## 2016-11-16 NOTE — Telephone Encounter (Signed)
Patient was given 2 bottles of contrast, per CT scheduled. Copy of December schedule was given to patient, per request. No 11/16/16 los, per [employer]. (symptom mgt) 11/16/16

## 2016-11-16 NOTE — Progress Notes (Addendum)
SYMPTOM MANAGEMENT CLINIC    Chief Complaint: Abdomen pain  HPI:  Walter French 70 y.o. male diagnosed with gastroesophageal cancer.  Patient is status post chemotherapy and radiation treatments in the past and is currently undergoing observation only-per patient's choice.     Gastroesophageal cancer (Walter French)   07/30/2015 Initial Diagnosis    Gastroesophageal cancer (Walter French)      10/23/2015 Tumor Marker    CEA=1.0 Walter French)      09/09/2016 Miscellaneous    [facility] results received       Review of Systems  Constitutional: Positive for malaise/fatigue.  Gastrointestinal: Positive for abdominal pain and vomiting.  All other systems reviewed and are negative.   Past Medical History:  Diagnosis Date  . A-fib ([place])    a. 11/09/2015: successful DCCV  . Allergy   . Anemia   . Anxiety   . Asthma    "small touch" (07/29/2013)  . Blood transfusion without reported diagnosis   . CHF (congestive heart failure) ([place])   . CHF (congestive heart failure) ([place])   . Coronary heart disease    a. s/p CABG x 3 (VG->PDA, VG->PL, LIMA->LAD);  b. 2013 Abnl Myoview;  c. 2013 Cath: 3/3 patent grafts, occluded LCX which correlated w/ ischemia on MV->not amenable to PCI->Med Rx.  . GERD (gastroesophageal reflux disease)   . Gout   . H/O hiatal hernia   . History of blood transfusion   . History of pneumonia    "3-4 times; last time ~ 2003" (07/29/2013)  . Hyperlipidemia   . Hyperlipidemia   . Hypertension   . Implantable cardioverter-defibrillator Mdt   . Obesity   . OSA on CPAP    not  using  . PAD (peripheral artery disease) (Walter French)    Angiography in February of 2014: Moderate left iliac artery stenosis, mild to moderate diffuse right SFA disease. Severe proximal right popliteal artery stenosis with three-vessel runoff below the knee. Status post self-expanding stent placement to the proximal popliteal artery  . PAD (peripheral artery disease) ([place])   . S/P radiation therapy  08/12/15-09/24/15   Ge  junction 54 Gy  . Shortness of breath dyspnea    "has been sob all my life"  . Sleep apnea   . Stomach cancer ([place]) 07/23/2015   invasive adenocarcinoma ,esophagus ,distal   . SVT (supraventricular tachycardia) ([place])   . Type II diabetes mellitus (HCC)    no longer on insulin  . VT (ventricular tachycardia) ([place])     Past Surgical History:  Procedure Laterality Date  . ABDOMINAL AORTAGRAM N/A 01/30/2013   Procedure: ABDOMINAL Maxcine Ham;  Surgeon: Wellington Hampshire, MD;  Location: [place] [facility] CATH LAB;  Service: Cardiovascular;  Laterality: N/A;  . CARDIAC CATHETERIZATION    . CARDIAC CATHETERIZATION N/A 07/22/2015   Procedure: Left Heart Cath and Coronary Angiography;  Surgeon: Peter M Martinique, MD;  Location: [place] CV LAB;  Service: Cardiovascular;  Laterality: N/A;  . CARDIAC [place]; ~ 2003  . CARDIOVERSION N/A 11/09/2015   Procedure: CARDIOVERSION;  Surgeon: Pixie Casino, MD;  Location: [facility] ENDOSCOPY;  Service: Cardiovascular;  Laterality: N/A;  . CATARACT EXTRACTION W/ INTRAOCULAR LENS IMPLANT Left   . CORONARY ARTERY BYPASS GRAFT  1997; 2007   "X3" (07/29/2013)  . ESOPHAGOGASTRODUODENOSCOPY N/A 07/23/2015   Procedure: ESOPHAGOGASTRODUODENOSCOPY (EGD);  Surgeon: Wonda Horner, MD;  Location: [facility] ENDOSCOPY;  Service: Endoscopy;  Laterality: N/A;  . ESOPHAGOGASTRODUODENOSCOPY N/A 02/18/2016   Procedure: ESOPHAGOGASTRODUODENOSCOPY (EGD);  Surgeon: Herbie Baltimore  Buccini, MD;  Location: [facility] ENDOSCOPY;  Service: Endoscopy;  Laterality: N/A;  . ESOPHAGOGASTRODUODENOSCOPY (EGD) WITH PROPOFOL N/A 04/05/2016   Procedure: ESOPHAGOGASTRODUODENOSCOPY (EGD) WITH PROPOFOL;  Surgeon: Clarene Essex, MD;  Location: [facility] ENDOSCOPY;  Service: Endoscopy;  Laterality: N/A;  . ESOPHAGOGASTRODUODENOSCOPY (EGD) WITH PROPOFOL N/A 04/28/2016   Procedure: ESOPHAGOGASTRODUODENOSCOPY (EGD) WITH PROPOFOL;  Surgeon: Clarene Essex, MD;  Location: [facility] ENDOSCOPY;  Service: Endoscopy;  Laterality:  N/A;  . HOT HEMOSTASIS N/A 04/05/2016   Procedure: HOT HEMOSTASIS (ARGON PLASMA COAGULATION/BICAP);  Surgeon: Clarene Essex, MD;  Location: [facility] ENDOSCOPY;  Service: Endoscopy;  Laterality: N/A;  . HOT HEMOSTASIS N/A 04/28/2016   Procedure: HOT HEMOSTASIS (ARGON PLASMA COAGULATION/BICAP);  Surgeon: Clarene Essex, MD;  Location: [facility] ENDOSCOPY;  Service: Endoscopy;  Laterality: N/A;  . IR GENERIC HISTORICAL  09/02/2016   IR FLUORO GUIDE PORT INSERTION RIGHT 09/02/2016 Arne Cleveland, MD [facility]-INTERV RAD  . IR GENERIC HISTORICAL  09/02/2016   IR US GUIDE VASC ACCESS RIGHT 09/02/2016 Arne Cleveland, MD [facility]INTER[facility]  . NERVE, TENDON AND ARTERY REPAIR Left 09/20/2013   Procedure: IRRIGATION AND DEBRIDEMENT,Exploration and Repair of Ulnar/Digital  Artery Nerve of Left Index finger;  Surgeon: Roseanne Kaufman, MD;  Location: [place];  Service: Orthopedics;  Laterality: Left;  . TEE WITHOUT CARDIOVERSION N/A 10/06/2015   Procedure: TRANSESOPHAGEAL ECHOCARDIOGRAM (TEE);  Surgeon: Pixie Casino, MD;  Location: [facility] ENDOSCOPY;  Service: Cardiovascular;  Laterality: N/A;  . TONSILLECTOMY    . WRIST FRACTURE SURGERY Right     has Hyperlipidemia; Coronary artery disease-status post CABG; Automatic implantable cardioverter-defibrillator in situ; Systolic and diastolic CHF, chronic ([place]); Near syncope; Gastroesophageal cancer ([place]); PAF- ; Anemia of chronic disease; Type 2 diabetes mellitus with renal manifestations ([place]); Hypothyroidism; Gout; Weakness generalized; Persistent atrial fibrillation ([place]); Atrial flutter with rapid ventricular response ([place]); Cardiomyopathy, ischemic-30-35%; Malnutrition of moderate degree; Constipation; Adenocarcinoma of esophagus ([place]); Amiodarone pulmonary toxicity (HCC)/ resolved; Chronic respiratory failure with hypoxia and hypercapnia ([place]); Peripheral arterial disease ([place]); Anemia due to blood loss; Atrial fibrillation ([place]); Anemia due to bone marrow failure ([place]); and Port catheter in place on  his problem list.    is allergic to adhesive [tape]; levaquin [levofloxacin in d5w]; and morphine and related.    Medication List       Accurate as of 11/16/16  4:21 PM. Always use your most recent med list.          allopurinol 300 MG tablet Commonly known as:  ZYLOPRIM Take 300 mg by mouth daily.   carvedilol 6.25 MG tablet Commonly known as:  COREG take 1 tablet by mouth twice a day with food   DIGITEK 0.125 MG tablet Generic drug:  digoxin take 1 tablet by mouth once daily   ferrous sulfate 325 (65 FE) MG EC tablet Take 325 mg by mouth daily with breakfast.   furosemide 40 MG tablet Commonly known as:  LASIX Take 1 tablet (40 mg total) by mouth daily.   levothyroxine 75 MCG tablet Commonly known as:  SYNTHROID, LEVOTHROID Take 75 mcg by mouth daily before breakfast.   lisinopril 5 MG tablet Commonly known as:  PRINIVIL,ZESTRIL Take 1 tablet (5 mg total) by mouth daily.   LORazepam 0.5 MG tablet Commonly known as:  ATIVAN Take 1 tablet (0.5 mg total) by mouth at bedtime as needed for anxiety.   nitroGLYCERIN 0.4 MG SL tablet Commonly known as:  NITROSTAT Place 1 tablet (0.4 mg total) under the tongue every 5 (five) minutes x 3 doses as needed for  chest pain.   oxyCODONE 5 MG immediate release tablet Commonly known as:  Oxy IR/ROXICODONE 1-2 tabs PO Q 6 hours PRN.   potassium chloride SA 20 MEQ tablet Commonly known as:  K-DUR,KLOR-CON Take 1 tablet (20 mEq total) by mouth daily.   ranitidine 300 MG tablet Commonly known as:  ZANTAC take 1 tablet by mouth once daily if needed for HEARTBURN        PHYSICAL EXAMINATION  Oncology Vitals 11/16/2016 11/09/2016  Height 183 cm 15 cm  Weight 93.668 kg 94.076 kg  Weight (lbs) 206 lbs 8 oz 207 lbs 6 oz  BMI (kg/m2) 28.01 kg/m2 4050.5 kg/m2  Temp 97.7 97.7  Pulse 88 83  Resp 18 18  SpO2 97 100  BSA (m2) 2.18 m2 0.63 m2   BP Readings from Last 2 Encounters:  11/16/16 (!) 109/54  11/09/16 140/78     Physical Exam  Constitutional: He is oriented to person, place, and time and well-developed, well-nourished, and in no distress.  HENT:  Head: Normocephalic and atraumatic.  Mouth/Throat: Oropharynx is clear and moist.  Eyes: Conjunctivae and EOM are normal. Pupils are equal, round, and reactive to light. Right eye exhibits no discharge. Left eye exhibits no discharge. No scleral icterus.  Neck: Normal range of motion. Neck supple. No JVD present. No tracheal deviation present. No thyromegaly present.  Cardiovascular: Normal rate, regular rhythm, normal heart sounds and intact distal pulses.   Pulmonary/Chest: Effort normal and breath sounds normal. No respiratory distress. He has no wheezes. He has no rales. He exhibits no tenderness.  Abdominal: Soft. Bowel sounds are normal. He exhibits no distension and no mass. There is tenderness. There is no rebound and no guarding.  Musculoskeletal: Normal range of motion. He exhibits no edema, tenderness or deformity.  Lymphadenopathy:    He has no cervical adenopathy.  Neurological: He is alert and oriented to person, place, and time. Gait normal.  Skin: Skin is warm and dry. No rash noted. No erythema. No pallor.  Psychiatric: Affect normal.  Nursing note and vitals reviewed.   LABORATORY DATA:. Appointment on 11/16/2016  Component Date Value Ref Range Status  . WBC 11/16/2016 7.4  4.0 - 10.3 10e3/uL Final  . NEUT# 11/16/2016 5.4  1.5 - 6.5 10e3/uL Final  . HGB 11/16/2016 12.4* 13.0 - 17.1 g/dL Final  . HCT 11/16/2016 38.6  38.4 - 49.9 % Final  . Platelets 11/16/2016 120* 140 - 400 10e3/uL Final  . MCV 11/16/2016 87.6  79.3 - 98.0 fL Final  . MCH 11/16/2016 28.2  27.2 - 33.4 pg Final  . MCHC 11/16/2016 32.2  32.0 - 36.0 g/dL Final  . RBC 11/16/2016 4.41  4.20 - 5.82 10e6/uL Final  . RDW 11/16/2016 16.0* 11.0 - 14.6 % Final  . lymph# 11/16/2016 0.9  0.9 - 3.3 10e3/uL Final  . MONO# 11/16/2016 0.9  0.1 - 0.9 10e3/uL Final  .  Eosinophils Absolute 11/16/2016 0.2  0.0 - 0.5 10e3/uL Final  . Basophils Absolute 11/16/2016 0.0  0.0 - 0.1 10e3/uL Final  . NEUT% 11/16/2016 72.5  39.0 - 75.0 % Final  . LYMPH% 11/16/2016 11.8* 14.0 - 49.0 % Final  . MONO% 11/16/2016 12.5  0.0 - 14.0 % Final  . EOS% 11/16/2016 2.5  0.0 - 7.0 % Final  . BASO% 11/16/2016 0.7  0.0 - 2.0 % Final  . Sodium 11/16/2016 138  136 - 145 mEq/L Final  . Potassium 11/16/2016 4.9  3.5 - 5.1 mEq/L Final  .  Chloride 11/16/2016 104  98 - 109 mEq/L Final  . CO2 11/16/2016 26  22 - 29 mEq/L Final  . Glucose 11/16/2016 122  70 - 140 mg/dl Final  . BUN 11/16/2016 14.8  7.0 - 26.0 mg/dL Final  . Creatinine 11/16/2016 1.1  0.7 - 1.3 mg/dL Final  . Total Bilirubin 11/16/2016 1.22* 0.20 - 1.20 mg/dL Final  . Alkaline Phosphatase 11/16/2016 357* 40 - 150 U/L Final  . AST 11/16/2016 46* 5 - 34 U/L Final  . ALT 11/16/2016 26  0 - 55 U/L Final  . Total Protein 11/16/2016 7.1  6.4 - 8.3 g/dL Final  . Albumin 11/16/2016 3.1* 3.5 - 5.0 g/dL Final  . Calcium 11/16/2016 9.2  8.4 - 10.4 mg/dL Final  . Anion Gap 11/16/2016 7  3 - 11 mEq/L Final  . EGFR 11/16/2016 71* >90 ml/min/1.73 m2 Final    RADIOGRAPHIC STUDIES: No results found.  ASSESSMENT/PLAN:    Gastroesophageal cancer [facility]) Patient is status post both chemotherapy and radiation treatments in the past; is currently undergoing observation only.  He presented to the [facility] today with acute onset right upper quadrant pain.  He states the pain is so bad that he dropped his knees at one point.  He states the pain was also so bad at one point that he actually vomited.  He denies any actual nausea, however.  He also denies any UTI symptoms whatsoever.  He denies any recent fevers or chills.  Patient states that he usually requires only occasional hydrocodone for his pain control; but has taken 3 Vicodin at a time; with his pain scale still at a 7 out of 10.  Exam today reveals bowel sounds positive in all 4  quads.  Patient has trace tenderness to the right upper quadrant with palpation.  Patient appears nontoxic.  Labs obtained today revealed bilirubin has increased to 1.22 and alkaline phosphatase has increased from 186 up to 357.  Vital signs were stable and patient was afebrile today.  Reviewed all findings with Dr. Benay Spice; he recommended a restaging CT to be obtained as soon as possible for further evaluation.  Patient will be scheduled for a CT with contrast of the chest/abdomen/pelvis for tomorrow morning at 7:30.  He was given the contrast to drink prior to his CT scan tomorrow morning.  He was also instructed to remain nothing by mouth other than the contrast in the morning for 4 hours prior to the CT scan.  Patient will return to the cancer surgery his scan results after his CT scan in the morning.  Patient was given oxycodone and instructed to take 1-2 of the tablets every 6 hours as needed for pain.  He was advised to hold any further hydrocodone.  In the meantime-patient was advised to call/return or go directly to the emergency department for any worsening symptoms whatsoever.     Patient stated understanding of all instructions; and was in agreement with this plan of care. The patient knows to call the clinic with any problems, questions or concerns.   Total time spent with patient was 25 minutes;  with greater than 75 percent of that time spent in face to face counseling regarding patient's symptoms,  and coordination of care and follow up.  Disclaimer:This dictation was prepared with Dragon/digital dictation along with [employer]. Any transcriptional errors that result from this process are unintentional.  Drue Second, NP 11/16/2016

## 2016-11-17 ENCOUNTER — Ambulatory Visit (HOSPITAL_BASED_OUTPATIENT_CLINIC_OR_DEPARTMENT_OTHER): Payer: PPO | Admitting: Nurse Practitioner

## 2016-11-17 ENCOUNTER — Encounter (HOSPITAL_COMMUNITY): Payer: Self-pay

## 2016-11-17 ENCOUNTER — Ambulatory Visit (HOSPITAL_COMMUNITY)
Admission: RE | Admit: 2016-11-17 | Discharge: 2016-11-17 | Disposition: A | Discharge status: Home / Self Care | Payer: PPO | Source: Ambulatory Visit | Attending: Nurse Practitioner | Admitting: Nurse Practitioner

## 2016-11-17 ENCOUNTER — Telehealth: Payer: Self-pay | Admitting: Nurse Practitioner

## 2016-11-17 ENCOUNTER — Encounter: Payer: Self-pay | Admitting: Nurse Practitioner

## 2016-11-17 VITALS — BP 140/59 | HR 87 | Temp 97.8°F | Resp 18 | Ht 72.0 in | Wt 206.9 lb

## 2016-11-17 DIAGNOSIS — C772 Secondary and unspecified malignant neoplasm of intra-abdominal lymph nodes: Secondary | ICD-10-CM | POA: Diagnosis not present

## 2016-11-17 DIAGNOSIS — C160 Malignant neoplasm of cardia: Secondary | ICD-10-CM | POA: Diagnosis not present

## 2016-11-17 DIAGNOSIS — R1011 Right upper quadrant pain: Secondary | ICD-10-CM

## 2016-11-17 DIAGNOSIS — I700 Atherosclerosis of aorta: Secondary | ICD-10-CM | POA: Diagnosis not present

## 2016-11-17 DIAGNOSIS — Z66 Do not resuscitate: Secondary | ICD-10-CM

## 2016-11-17 DIAGNOSIS — C159 Malignant neoplasm of esophagus, unspecified: Secondary | ICD-10-CM | POA: Diagnosis not present

## 2016-11-17 DIAGNOSIS — C786 Secondary malignant neoplasm of retroperitoneum and peritoneum: Secondary | ICD-10-CM | POA: Diagnosis not present

## 2016-11-17 DIAGNOSIS — R17 Unspecified jaundice: Secondary | ICD-10-CM

## 2016-11-17 DIAGNOSIS — R591 Generalized enlarged lymph nodes: Secondary | ICD-10-CM | POA: Diagnosis not present

## 2016-11-17 DIAGNOSIS — C787 Secondary malignant neoplasm of liver and intrahepatic bile duct: Secondary | ICD-10-CM

## 2016-11-17 DIAGNOSIS — R748 Abnormal levels of other serum enzymes: Secondary | ICD-10-CM

## 2016-11-17 MED ORDER — SODIUM CHLORIDE 0.9 % IJ SOLN
INTRAMUSCULAR | Status: AC
  Filled 2016-11-17: qty 50

## 2016-11-17 MED ORDER — IOPAMIDOL (ISOVUE-300) INJECTION 61%
100.0000 mL | Freq: Once | INTRAVENOUS | Status: AC | PRN
  Administered 2016-11-17: 100 mL via INTRAVENOUS

## 2016-11-17 MED ORDER — IOPAMIDOL (ISOVUE-300) INJECTION 61%
INTRAVENOUS | Status: AC
  Filled 2016-11-17: qty 100

## 2016-11-17 NOTE — Assessment & Plan Note (Signed)
Patient is status post both chemotherapy and radiation treatments in the past; is currently undergoing observation only.  He presented to the [facility] today with acute onset right upper quadrant pain.  He states the pain is so bad that he dropped his knees at one point.  He states the pain was also so bad at one point that he actually vomited.  He denies any actual nausea, however.  He also denies any UTI symptoms whatsoever.  He denies any recent fevers or chills.  Patient states that he usually requires only occasional hydrocodone for his pain control; but has taken 3 Vicodin at a time; with his pain scale still at a 7 out of 10.  Exam today reveals bowel sounds positive in all 4 quads.  Patient has trace tenderness to the right upper quadrant with palpation.  Patient appears nontoxic.  Labs obtained today revealed bilirubin has increased to 1.22 and alkaline phosphatase has increased from 186 up to 357.  Vital signs were stable and patient was afebrile today.  Reviewed all findings with Dr. Benay Spice; he recommended a restaging CT to be obtained as soon as possible for further evaluation.  Patient will be scheduled for a CT with contrast of the chest/abdomen/pelvis for tomorrow morning at 7:30.  He was given the contrast to drink prior to his CT scan tomorrow morning.  He was also instructed to remain nothing by mouth other than the contrast in the morning for 4 hours prior to the CT scan.  Patient will return to the cancer surgery his scan results after his CT scan in the morning.  Patient was given oxycodone and instructed to take 1-2 of the tablets every 6 hours as needed for pain.  He was advised to hold any further hydrocodone.  In the meantime-patient was advised to call/return or go directly to the emergency department for any worsening symptoms whatsoever. _____________________________________________  Update: Patient is return to the [facility] today after undergoing a restaging  CT with contrast of the chest/abdomen/pelvis earlier this morning.  He states that the pain to the right upper quadrant has once again returned; and he had to take the oxycodone as directed earlier this morning.  He denies any nausea, vomiting, diarrhea, or constipation issues.  At this point.  He also denies any recent fevers or chills.  Scan results from today revealed:   IMPRESSION: 1. Interval progression of disease. There has been enlargement and posterior mediastinal and left supraclavicular lymph nodes. There has also been interval enlargement of retroperitoneal metastatic adenopathy. Metastasis within the right lobe of liver has increased in size in the interval. 2. Aortic atherosclerosis.   Dr. Benay Spice in to review all scan results with the patient and his family members.  Unfortunately, patient does have progression of his disease.  Patient remains adamant that he does not want any chemotherapy treatments whatsoever.  His main concern is managing any pain.  Dr. Benay Spice advised the patient to consider a hospice referral.  Patient was in agreement with the initiation of hospice; and also was in agreement regarding a DO NOT RESUSCITATE order as well.  Patient was given oxycodone for pain management, just yesterday.  He advised patient and his family members that the hospice nurse with manage patient's pain medications in the future; but that the cancer centers always available to assist in any way we can.  Also reviewed the bowel regimen needed when patient is taking chronic pain medication.  Patient was given printed instructions regarding bowel regimen  as well.  Patient is scheduled to return for flush and a follow-up visit with Dr. Benay Spice on 12/09/2016.  He knows to call in the interim with any new worries or concerns whatsoever.

## 2016-11-17 NOTE — Telephone Encounter (Signed)
Called [employer] and spoke to [place].  Advised her that patient has agreed to a hospice referral; and is also agreed to a DO NOT RESUSCITATE order as well.  Confirm that Dr. Lupita Leash will be the attending physician for the hospice referral.  She stated that all paperwork will be faxed to Dr. Benay Spice for his signature.

## 2016-11-17 NOTE — Progress Notes (Signed)
SYMPTOM MANAGEMENT CLINIC    Chief Complaint: Abdomen pain  HPI:  Walter French 70 y.o. male diagnosed with gastroesophageal cancer.  Patient is status post chemotherapy and radiation treatments in the past and is currently undergoing observation only-per patient's choice.     Gastroesophageal cancer ([place])   07/30/2015 Initial Diagnosis    Gastroesophageal cancer ([place])      10/23/2015 Tumor Marker    CEA=1.0 [name])      09/09/2016 Miscellaneous    [facility] results received       Review of Systems  Constitutional: Positive for malaise/fatigue.  Gastrointestinal: Positive for abdominal pain.  All other systems reviewed and are negative.   Past Medical History:  Diagnosis Date  . A-fib ([place])    a. 11/09/2015: successful DCCV  . Allergy   . Anemia   . Anxiety   . Asthma    "small touch" (07/29/2013)  . Blood transfusion without reported diagnosis   . CHF (congestive heart failure) ([place])   . CHF (congestive heart failure) ([place])   . Coronary heart disease    a. s/p CABG x 3 (VG->PDA, VG->PL, LIMA->LAD);  b. 2013 Abnl Myoview;  c. 2013 Cath: 3/3 patent grafts, occluded LCX which correlated w/ ischemia on MV->not amenable to PCI->Med Rx.  . GERD (gastroesophageal reflux disease)   . Gout   . H/O hiatal hernia   . History of blood transfusion   . History of pneumonia    "3-4 times; last time ~ 2003" (07/29/2013)  . Hyperlipidemia   . Hyperlipidemia   . Hypertension   . Implantable cardioverter-defibrillator Mdt   . Obesity   . OSA on CPAP    not  using  . PAD (peripheral artery disease) ([place])    Angiography in February of 2014: Moderate left iliac artery stenosis, mild to moderate diffuse right SFA disease. Severe proximal right popliteal artery stenosis with three-vessel runoff below the knee. Status post self-expanding stent placement to the proximal popliteal artery  . PAD (peripheral artery disease) ([place])   . S/P radiation therapy 08/12/15-09/24/15   Ge  junction 54 Gy  . Shortness of breath dyspnea    "has been sob all my life"  . Sleep apnea   . Stomach cancer ([place]) 07/23/2015   invasive adenocarcinoma ,esophagus ,distal   . SVT (supraventricular tachycardia) ([place])   . Type II diabetes mellitus (HCC)    no longer on insulin  . VT (ventricular tachycardia) ([place])     Past Surgical History:  Procedure Laterality Date  . ABDOMINAL AORTAGRAM N/A 01/30/2013   Procedure: ABDOMINAL Maxcine Ham;  Surgeon: Wellington Hampshire, MD;  Location: [place] CATH LAB;  Service: Cardiovascular;  Laterality: N/A;  . CARDIAC CATHETERIZATION    . CARDIAC CATHETERIZATION N/A 07/22/2015   Procedure: Left Heart Cath and Coronary Angiography;  Surgeon: Peter M Martinique, MD;  Location: [place] [facility] CV LAB;  Service: Cardiovascular;  Laterality: N/A;  . CARDIAC [place]; ~ 2003  . CARDIOVERSION N/A 11/09/2015   Procedure: CARDIOVERSION;  Surgeon: Pixie Casino, MD;  Location: [facility] ENDOSCOPY;  Service: Cardiovascular;  Laterality: N/A;  . CATARACT EXTRACTION W/ INTRAOCULAR LENS IMPLANT Left   . CORONARY ARTERY BYPASS GRAFT  1997; 2007   "X3" (07/29/2013)  . ESOPHAGOGASTRODUODENOSCOPY N/A 07/23/2015   Procedure: ESOPHAGOGASTRODUODENOSCOPY (EGD);  Surgeon: Wonda Horner, MD;  Location: [facility] ENDOSCOPY;  Service: Endoscopy;  Laterality: N/A;  . ESOPHAGOGASTRODUODENOSCOPY N/A 02/18/2016   Procedure: ESOPHAGOGASTRODUODENOSCOPY (EGD);  Surgeon: Ronald Lobo, MD;  Location: [facility] ENDOSCOPY;  Service: Endoscopy;  Laterality: N/A;  . ESOPHAGOGASTRODUODENOSCOPY (EGD) WITH PROPOFOL N/A 04/05/2016   Procedure: ESOPHAGOGASTRODUODENOSCOPY (EGD) WITH PROPOFOL;  Surgeon: Clarene Essex, MD;  Location: [facility] ENDOSCOPY;  Service: Endoscopy;  Laterality: N/A;  . ESOPHAGOGASTRODUODENOSCOPY (EGD) WITH PROPOFOL N/A 04/28/2016   Procedure: ESOPHAGOGASTRODUODENOSCOPY (EGD) WITH PROPOFOL;  Surgeon: Clarene Essex, MD;  Location: [facility] ENDOSCOPY;  Service: Endoscopy;  Laterality: N/A;  . HOT  HEMOSTASIS N/A 04/05/2016   Procedure: HOT HEMOSTASIS (ARGON PLASMA COAGULATION/BICAP);  Surgeon: Clarene Essex, MD;  Location: [facility] ENDOSCOPY;  Service: Endoscopy;  Laterality: N/A;  . HOT HEMOSTASIS N/A 04/28/2016   Procedure: HOT HEMOSTASIS (ARGON PLASMA COAGULATION/BICAP);  Surgeon: Clarene Essex, MD;  Location: [facility] ENDOSCOPY;  Service: Endoscopy;  Laterality: N/A;  . IR GENERIC HISTORICAL  09/02/2016   IR FLUORO GUIDE PORT INSERTION RIGHT 09/02/2016 Arne Cleveland, MD [facility]-INTERV RAD  . IR GENERIC HISTORICAL  09/02/2016   IR US GUIDE VASC ACCESS RIGHT 09/02/2016 Arne Cleveland, MD [facility]-INTERV RAD  . NERVE, TENDON AND ARTERY REPAIR Left 09/20/2013   Procedure: IRRIGATION AND DEBRIDEMENT,Exploration and Repair of Ulnar/Digital  Artery Nerve of Left Index finger;  Surgeon: Roseanne Kaufman, MD;  Location: [place];  Service: Orthopedics;  Laterality: Left;  . TEE WITHOUT CARDIOVERSION N/A 10/06/2015   Procedure: TRANSESOPHAGEAL ECHOCARDIOGRAM (TEE);  Surgeon: Pixie Casino, MD;  Location: [facility] ENDOSCOPY;  Service: Cardiovascular;  Laterality: N/A;  . TONSILLECTOMY    . WRIST FRACTURE SURGERY Right     has Hyperlipidemia; Coronary artery disease-status post CABG; Automatic implantable cardioverter-defibrillator in situ; Systolic and diastolic CHF, chronic ([place]); Near syncope; Gastroesophageal cancer ([place]); PAF- ; Anemia of chronic disease; Type 2 diabetes mellitus with renal manifestations ([place]); Hypothyroidism; Gout; Weakness generalized; Persistent atrial fibrillation ([place]); Atrial flutter with rapid ventricular response ([place]); Cardiomyopathy, ischemic-30-35%; Malnutrition of moderate degree; Constipation; Adenocarcinoma of esophagus ([place]); Amiodarone pulmonary toxicity (HCC)/ resolved; Chronic respiratory failure with hypoxia and hypercapnia ([place]); Peripheral arterial disease ([place]); Anemia due to blood loss; Atrial fibrillation ([place]); Anemia due to bone marrow failure ([place]); and Port catheter in place on his problem  list.    is allergic to adhesive [tape]; levaquin [levofloxacin in d5w]; and morphine and related.    Medication List       Accurate as of 11/17/16 10:29 AM. Always use your most recent med list.          allopurinol 300 MG tablet Commonly known as:  ZYLOPRIM Take 300 mg by mouth daily.   carvedilol 6.25 MG tablet Commonly known as:  COREG take 1 tablet by mouth twice a day with food   DIGITEK 0.125 MG tablet Generic drug:  digoxin take 1 tablet by mouth once daily   ferrous sulfate 325 (65 FE) MG EC tablet Take 325 mg by mouth daily with breakfast.   furosemide 40 MG tablet Commonly known as:  LASIX Take 1 tablet (40 mg total) by mouth daily.   levothyroxine 75 MCG tablet Commonly known as:  SYNTHROID, LEVOTHROID Take 75 mcg by mouth daily before breakfast.   lisinopril 5 MG tablet Commonly known as:  PRINIVIL,ZESTRIL Take 1 tablet (5 mg total) by mouth daily.   LORazepam 0.5 MG tablet Commonly known as:  ATIVAN Take 1 tablet (0.5 mg total) by mouth at bedtime as needed for anxiety.   nitroGLYCERIN 0.4 MG SL tablet Commonly known as:  NITROSTAT Place 1 tablet (0.4 mg total) under the tongue every 5 (five) minutes x 3 doses as needed for chest pain.  oxyCODONE 5 MG immediate release tablet Commonly known as:  Oxy IR/ROXICODONE 1-2 tabs PO Q 6 hours PRN.   potassium chloride SA 20 MEQ tablet Commonly known as:  K-DUR,KLOR-CON Take 1 tablet (20 mEq total) by mouth daily.   ranitidine 300 MG tablet Commonly known as:  ZANTAC take 1 tablet by mouth once daily if needed for HEARTBURN        PHYSICAL EXAMINATION  Oncology Vitals 11/17/2016 11/16/2016  Height 183 cm 183 cm  Weight 93.849 kg 93.668 kg  Weight (lbs) 206 lbs 14 oz 206 lbs 8 oz  BMI (kg/m2) 28.06 kg/m2 28.01 kg/m2  Temp 97.8 97.7  Pulse 87 88  Resp 18 18  SpO2 97 97  BSA (m2) 2.18 m2 2.18 m2   BP Readings from Last 2 Encounters:  11/17/16 (!) 140/59  11/16/16 (!) 109/54    Physical  Exam  Constitutional: He is oriented to person, place, and time and well-developed, well-nourished, and in no distress.  HENT:  Head: Normocephalic and atraumatic.  Mouth/Throat: Oropharynx is clear and moist.  Eyes: Conjunctivae and EOM are normal. Pupils are equal, round, and reactive to light. Right eye exhibits no discharge. Left eye exhibits no discharge. No scleral icterus.  Neck: Normal range of motion. Neck supple. No JVD present. No tracheal deviation present. No thyromegaly present.  Cardiovascular: Normal rate, regular rhythm, normal heart sounds and intact distal pulses.   Pulmonary/Chest: Effort normal and breath sounds normal. No respiratory distress. He has no wheezes. He has no rales. He exhibits no tenderness.  Abdominal: Soft. Bowel sounds are normal. He exhibits no distension and no mass. There is tenderness. There is no rebound and no guarding.  Musculoskeletal: Normal range of motion. He exhibits no edema, tenderness or deformity.  Lymphadenopathy:    He has no cervical adenopathy.  Neurological: He is alert and oriented to person, place, and time. Gait normal.  Skin: Skin is warm and dry. No rash noted. No erythema. No pallor.  Psychiatric: Affect normal.  Nursing note and vitals reviewed.   LABORATORY DATA:. Appointment on 11/16/2016  Component Date Value Ref Range Status  . WBC 11/16/2016 7.4  4.0 - 10.3 10e3/uL Final  . NEUT# 11/16/2016 5.4  1.5 - 6.5 10e3/uL Final  . HGB 11/16/2016 12.4* 13.0 - 17.1 g/dL Final  . HCT 11/16/2016 38.6  38.4 - 49.9 % Final  . Platelets 11/16/2016 120* 140 - 400 10e3/uL Final  . MCV 11/16/2016 87.6  79.3 - 98.0 fL Final  . MCH 11/16/2016 28.2  27.2 - 33.4 pg Final  . MCHC 11/16/2016 32.2  32.0 - 36.0 g/dL Final  . RBC 11/16/2016 4.41  4.20 - 5.82 10e6/uL Final  . RDW 11/16/2016 16.0* 11.0 - 14.6 % Final  . lymph# 11/16/2016 0.9  0.9 - 3.3 10e3/uL Final  . MONO# 11/16/2016 0.9  0.1 - 0.9 10e3/uL Final  . Eosinophils Absolute  11/16/2016 0.2  0.0 - 0.5 10e3/uL Final  . Basophils Absolute 11/16/2016 0.0  0.0 - 0.1 10e3/uL Final  . NEUT% 11/16/2016 72.5  39.0 - 75.0 % Final  . LYMPH% 11/16/2016 11.8* 14.0 - 49.0 % Final  . MONO% 11/16/2016 12.5  0.0 - 14.0 % Final  . EOS% 11/16/2016 2.5  0.0 - 7.0 % Final  . BASO% 11/16/2016 0.7  0.0 - 2.0 % Final  . Sodium 11/16/2016 138  136 - 145 mEq/L Final  . Potassium 11/16/2016 4.9  3.5 - 5.1 mEq/L Final  . Chloride 11/16/2016 104  98 - 109 mEq/L Final  . CO2 11/16/2016 26  22 - 29 mEq/L Final  . Glucose 11/16/2016 122  70 - 140 mg/dl Final  . BUN 11/16/2016 14.8  7.0 - 26.0 mg/dL Final  . Creatinine 11/16/2016 1.1  0.7 - 1.3 mg/dL Final  . Total Bilirubin 11/16/2016 1.22* 0.20 - 1.20 mg/dL Final  . Alkaline Phosphatase 11/16/2016 357* 40 - 150 U/L Final  . AST 11/16/2016 46* 5 - 34 U/L Final  . ALT 11/16/2016 26  0 - 55 U/L Final  . Total Protein 11/16/2016 7.1  6.4 - 8.3 g/dL Final  . Albumin 11/16/2016 3.1* 3.5 - 5.0 g/dL Final  . Calcium 11/16/2016 9.2  8.4 - 10.4 mg/dL Final  . Anion Gap 11/16/2016 7  3 - 11 mEq/L Final  . EGFR 11/16/2016 71* >90 ml/min/1.73 m2 Final    RADIOGRAPHIC STUDIES: Ct Chest W Contrast  Result Date: 11/17/2016 CLINICAL DATA:  Restaging esophageal carcinoma. EXAM: CT CHEST, ABDOMEN, AND PELVIS WITH CONTRAST TECHNIQUE: Multidetector CT imaging of the chest, abdomen and pelvis was performed following the standard protocol during bolus administration of intravenous contrast. CONTRAST:  129m ISOVUE-300 IOPAMIDOL (ISOVUE-300) INJECTION 61% COMPARISON:  08/08/2016 FINDINGS: CT CHEST FINDINGS Cardiovascular: Normal heart size. Aortic atherosclerosis. Previous median sternotomy and CABG procedure. Left chest wall ICD with lead in the right ventricle. Mediastinum/Nodes: The trachea appears patent and is midline. Left supraclavicular lymph node measures 9 mm, image 4 of series 2. Previously 5 mm. Within the posterior mediastinum there are several  enlarging lymph nodes. Node between the descending aorta and left atrium measures 2 cm, image 30 of series 2. On the previous exam this measured 1.1 cm. On the contralateral side there is a node between the esophagus and left atrium which measures 1.5 cm, image 30 of series 2. On the previous exam this measured 1 cm. In the left supraclavicular region there is a 9 mm lymph node, image number 4 of series 2. Previously 5 mm. Lungs/Pleura: Small left pleural effusion is identified. There is mild pleural thickening overlying the posterior medial right lower lobe. Mild changes of centrilobular emphysema. Diffuse bronchial wall thickening identified. Musculoskeletal: There is mild spondylosis identified within the thoracic spine. No aggressive lytic or sclerotic bone lesions. CT ABDOMEN PELVIS FINDINGS Hepatobiliary: Within the posterior right lobe of liver there is a mass which measures 6.0 x 4.8 cm, image 56 of series 2. Previously 2.7 x 3.8 cm. Small gallstone identified. Pancreas: Unremarkable. No pancreatic ductal dilatation or surrounding inflammatory changes. Spleen: The spleen measures 15 cm in length, unchanged from previous exam. No focal splenic abnormality. Adrenals/Urinary Tract: The adrenal glands are normal. Bilateral renal cysts are again identified. There are calcifications within both kidneys which are favored to be vascular in etiology. No mass or hydronephrosis. The urinary bladder appears normal. Stomach/Bowel: Stomach is within normal limits. Appendix appears normal. No evidence of bowel wall thickening, distention, or inflammatory changes. Vascular/Lymphatic: Aortic atherosclerosis. No aneurysm. There are multiple large lymph nodes identified within the upper abdomen. The index portacaval node Measures 2.8 cm short axis, image 62 of series 2. Previously 1.3 cm. The index aortocaval node measures 1.4 cm, image 68 of series 2. Previously 1.2 cm. Periaortic lymph node Measures 1.5 cm, image 69 of  series 2. No pelvic or inguinal adenopathy. Reproductive: Prostate gland and seminal vesicles appear normal. Other: There is no ascites or focal fluid collections within the abdomen or pelvis. Musculoskeletal: Degenerative disc disease identified within the lumbar spine.  No aggressive lytic or sclerotic bone lesions. IMPRESSION: 1. Interval progression of disease. There has been enlargement and posterior mediastinal and left supraclavicular lymph nodes. There has also been interval enlargement of retroperitoneal metastatic adenopathy. Metastasis within the right lobe of liver has increased in size in the interval. 2. Aortic atherosclerosis. Electronically Signed   By: Kerby Moors M.D.   On: 11/17/2016 09:16   Ct Abdomen Pelvis W Contrast  Result Date: 11/17/2016 CLINICAL DATA:  Restaging esophageal carcinoma. EXAM: CT CHEST, ABDOMEN, AND PELVIS WITH CONTRAST TECHNIQUE: Multidetector CT imaging of the chest, abdomen and pelvis was performed following the standard protocol during bolus administration of intravenous contrast. CONTRAST:  149m ISOVUE-300 IOPAMIDOL (ISOVUE-300) INJECTION 61% COMPARISON:  08/08/2016 FINDINGS: CT CHEST FINDINGS Cardiovascular: Normal heart size. Aortic atherosclerosis. Previous median sternotomy and CABG procedure. Left chest wall ICD with lead in the right ventricle. Mediastinum/Nodes: The trachea appears patent and is midline. Left supraclavicular lymph node measures 9 mm, image 4 of series 2. Previously 5 mm. Within the posterior mediastinum there are several enlarging lymph nodes. Node between the descending aorta and left atrium measures 2 cm, image 30 of series 2. On the previous exam this measured 1.1 cm. On the contralateral side there is a node between the esophagus and left atrium which measures 1.5 cm, image 30 of series 2. On the previous exam this measured 1 cm. In the left supraclavicular region there is a 9 mm lymph node, image number 4 of series 2. Previously 5 mm.  Lungs/Pleura: Small left pleural effusion is identified. There is mild pleural thickening overlying the posterior medial right lower lobe. Mild changes of centrilobular emphysema. Diffuse bronchial wall thickening identified. Musculoskeletal: There is mild spondylosis identified within the thoracic spine. No aggressive lytic or sclerotic bone lesions. CT ABDOMEN PELVIS FINDINGS Hepatobiliary: Within the posterior right lobe of liver there is a mass which measures 6.0 x 4.8 cm, image 56 of series 2. Previously 2.7 x 3.8 cm. Small gallstone identified. Pancreas: Unremarkable. No pancreatic ductal dilatation or surrounding inflammatory changes. Spleen: The spleen measures 15 cm in length, unchanged from previous exam. No focal splenic abnormality. Adrenals/Urinary Tract: The adrenal glands are normal. Bilateral renal cysts are again identified. There are calcifications within both kidneys which are favored to be vascular in etiology. No mass or hydronephrosis. The urinary bladder appears normal. Stomach/Bowel: Stomach is within normal limits. Appendix appears normal. No evidence of bowel wall thickening, distention, or inflammatory changes. Vascular/Lymphatic: Aortic atherosclerosis. No aneurysm. There are multiple large lymph nodes identified within the upper abdomen. The index portacaval node Measures 2.8 cm short axis, image 62 of series 2. Previously 1.3 cm. The index aortocaval node measures 1.4 cm, image 68 of series 2. Previously 1.2 cm. Periaortic lymph node Measures 1.5 cm, image 69 of series 2. No pelvic or inguinal adenopathy. Reproductive: Prostate gland and seminal vesicles appear normal. Other: There is no ascites or focal fluid collections within the abdomen or pelvis. Musculoskeletal: Degenerative disc disease identified within the lumbar spine. No aggressive lytic or sclerotic bone lesions. IMPRESSION: 1. Interval progression of disease. There has been enlargement and posterior mediastinal and left  supraclavicular lymph nodes. There has also been interval enlargement of retroperitoneal metastatic adenopathy. Metastasis within the right lobe of liver has increased in size in the interval. 2. Aortic atherosclerosis. Electronically Signed   By: TKerby MoorsM.D.   On: 11/17/2016 09:16    ASSESSMENT/PLAN:    Gastroesophageal cancer ([facility] Patient is status post both chemotherapy  and radiation treatments in the past; is currently undergoing observation only.  He presented to the [facility] today with acute onset right upper quadrant pain.  He states the pain is so bad that he dropped his knees at one point.  He states the pain was also so bad at one point that he actually vomited.  He denies any actual nausea, however.  He also denies any UTI symptoms whatsoever.  He denies any recent fevers or chills.  Patient states that he usually requires only occasional hydrocodone for his pain control; but has taken 3 Vicodin at a time; with his pain scale still at a 7 out of 10.  Exam today reveals bowel sounds positive in all 4 quads.  Patient has trace tenderness to the right upper quadrant with palpation.  Patient appears nontoxic.  Labs obtained today revealed bilirubin has increased to 1.22 and alkaline phosphatase has increased from 186 up to 357.  Vital signs were stable and patient was afebrile today.  Reviewed all findings with Dr. Benay Spice; he recommended a restaging CT to be obtained as soon as possible for further evaluation.  Patient will be scheduled for a CT with contrast of the chest/abdomen/pelvis for tomorrow morning at 7:30.  He was given the contrast to drink prior to his CT scan tomorrow morning.  He was also instructed to remain nothing by mouth other than the contrast in the morning for 4 hours prior to the CT scan.  Patient will return to the cancer surgery his scan results after his CT scan in the morning.  Patient was given oxycodone and instructed to take 1-2 of the tablets  every 6 hours as needed for pain.  He was advised to hold any further hydrocodone.  In the meantime-patient was advised to call/return or go directly to the emergency department for any worsening symptoms whatsoever. _____________________________________________  Update: Patient is return to the [facility] today after undergoing a restaging CT with contrast of the chest/abdomen/pelvis earlier this morning.  He states that the pain to the right upper quadrant has once again returned; and he had to take the oxycodone as directed earlier this morning.  He denies any nausea, vomiting, diarrhea, or constipation issues.  At this point.  He also denies any recent fevers or chills.  Scan results from today revealed:   IMPRESSION: 1. Interval progression of disease. There has been enlargement and posterior mediastinal and left supraclavicular lymph nodes. There has also been interval enlargement of retroperitoneal metastatic adenopathy. Metastasis within the right lobe of liver has increased in size in the interval. 2. Aortic atherosclerosis.   Dr. Benay Spice in to review all scan results with the patient and his family members.  Unfortunately, patient does have progression of his disease.  Patient remains adamant that he does not want any chemotherapy treatments whatsoever.  His main concern is managing any pain.  Dr. Benay Spice advised the patient to consider a hospice referral.  Patient was in agreement with the initiation of hospice; and also was in agreement regarding a DO NOT RESUSCITATE order as well.  Patient was given oxycodone for pain management, just yesterday.  He advised patient and his family members that the hospice nurse with manage patient's pain medications in the future; but that the cancer centers always available to assist in any way we can.  Also reviewed the bowel regimen needed when patient is taking chronic pain medication.  Patient was given printed instructions regarding  bowel regimen as well.  Patient is  scheduled to return for flush and a follow-up visit with Dr. Benay Spice on 12/09/2016.  He knows to call in the interim with any new worries or concerns whatsoever.       Patient stated understanding of all instructions; and was in agreement with this plan of care. The patient knows to call the clinic with any problems, questions or concerns.   Total time spent with patient was 25 minutes;  with greater than 75 percent of that time spent in face to face counseling regarding patient's symptoms,  and coordination of care and follow up.  Disclaimer:This dictation was prepared with Dragon/digital dictation along with [employer]. Any transcriptional errors that result from this process are unintentional.  Drue Second, NP 11/17/2016  This was a shared visit with Drue Second. Mr. Ayesha Rumpf was interviewed and examined. I reviewed the CT images from earlier today. Mr. Ayesha Rumpf has progressive disease in the liver and lymph nodes. We discussed the CT findings with Mr. Ayesha Rumpf and his friends. He again indicated he does not wish to receive treatment for the cancer. The pain is likely related to the dominant liver mass. He agrees to a [facility] referral. We discussed CPR and ACLS issues. He will be placed on a no blue status.  He can discuss management of the defibrillator with Dr. Martinique.  Mr. Ayesha Rumpf will return for an office visit as scheduled on 12/09/2016.  Julieanne Manson, M.D.

## 2016-11-19 ENCOUNTER — Other Ambulatory Visit: Payer: Self-pay | Admitting: Oncology

## 2016-11-19 DIAGNOSIS — C160 Malignant neoplasm of cardia: Secondary | ICD-10-CM

## 2016-11-30 DIAGNOSIS — I10 Essential (primary) hypertension: Secondary | ICD-10-CM | POA: Diagnosis not present

## 2016-11-30 DIAGNOSIS — E118 Type 2 diabetes mellitus with unspecified complications: Secondary | ICD-10-CM | POA: Diagnosis not present

## 2016-11-30 DIAGNOSIS — E7800 Pure hypercholesterolemia, unspecified: Secondary | ICD-10-CM | POA: Diagnosis not present

## 2016-12-07 DIAGNOSIS — C160 Malignant neoplasm of cardia: Secondary | ICD-10-CM | POA: Diagnosis not present

## 2016-12-07 DIAGNOSIS — I2510 Atherosclerotic heart disease of native coronary artery without angina pectoris: Secondary | ICD-10-CM | POA: Diagnosis not present

## 2016-12-07 DIAGNOSIS — E119 Type 2 diabetes mellitus without complications: Secondary | ICD-10-CM | POA: Diagnosis not present

## 2016-12-07 DIAGNOSIS — E039 Hypothyroidism, unspecified: Secondary | ICD-10-CM | POA: Diagnosis not present

## 2016-12-07 DIAGNOSIS — I4891 Unspecified atrial fibrillation: Secondary | ICD-10-CM | POA: Diagnosis not present

## 2016-12-07 DIAGNOSIS — I10 Essential (primary) hypertension: Secondary | ICD-10-CM | POA: Diagnosis not present

## 2016-12-07 DIAGNOSIS — E7800 Pure hypercholesterolemia, unspecified: Secondary | ICD-10-CM | POA: Diagnosis not present

## 2016-12-08 ENCOUNTER — Telehealth: Payer: Self-pay | Admitting: *Deleted

## 2016-12-09 ENCOUNTER — Ambulatory Visit (HOSPITAL_BASED_OUTPATIENT_CLINIC_OR_DEPARTMENT_OTHER): Payer: PPO | Admitting: Oncology

## 2016-12-09 ENCOUNTER — Telehealth: Payer: Self-pay | Admitting: Oncology

## 2016-12-09 ENCOUNTER — Ambulatory Visit: Payer: PPO

## 2016-12-09 VITALS — BP 151/77 | HR 89 | Temp 97.7°F | Resp 17 | Ht 72.0 in | Wt 203.2 lb

## 2016-12-09 DIAGNOSIS — G893 Neoplasm related pain (acute) (chronic): Secondary | ICD-10-CM

## 2016-12-09 DIAGNOSIS — R1310 Dysphagia, unspecified: Secondary | ICD-10-CM

## 2016-12-09 DIAGNOSIS — C787 Secondary malignant neoplasm of liver and intrahepatic bile duct: Secondary | ICD-10-CM

## 2016-12-09 DIAGNOSIS — E119 Type 2 diabetes mellitus without complications: Secondary | ICD-10-CM

## 2016-12-09 DIAGNOSIS — I4891 Unspecified atrial fibrillation: Secondary | ICD-10-CM

## 2016-12-09 DIAGNOSIS — C786 Secondary malignant neoplasm of retroperitoneum and peritoneum: Secondary | ICD-10-CM | POA: Diagnosis not present

## 2016-12-09 DIAGNOSIS — Z95828 Presence of other vascular implants and grafts: Secondary | ICD-10-CM

## 2016-12-09 DIAGNOSIS — C160 Malignant neoplasm of cardia: Secondary | ICD-10-CM

## 2016-12-09 MED ORDER — HEPARIN SOD (PORK) LOCK FLUSH 100 UNIT/ML IV SOLN
500.0000 [IU] | Freq: Once | INTRAVENOUS | Status: AC | PRN
  Administered 2016-12-09: 500 [IU] via INTRAVENOUS
  Filled 2016-12-09: qty 5

## 2016-12-09 MED ORDER — SODIUM CHLORIDE 0.9% FLUSH
10.0000 mL | INTRAVENOUS | Status: DC | PRN
  Administered 2016-12-09: 10 mL via INTRAVENOUS
  Filled 2016-12-09: qty 10

## 2016-12-09 NOTE — Telephone Encounter (Signed)
Appointments scheduled per 12/29 LOS. Patient given AVS report and calendars with future scheduled appointments. °

## 2016-12-09 NOTE — Patient Instructions (Signed)

## 2016-12-09 NOTE — Progress Notes (Signed)
[place] OFFICE PROGRESS NOTE   Diagnosis: Gastroesophageal carcinoma  INTERVAL HISTORY:   [name] returns as scheduled. He has enrolled in the [facility] hospice program. His pain is under adequate control with methadone and oxycodone. He has developed liquid and solid dysphagia. No vomiting. The hospice nurse is visiting weekly.  Objective:  Vital signs in last 24 hours:  Blood pressure (!) 151/77, pulse 89, temperature 97.7 F (36.5 C), temperature source Oral, resp. rate 17, height 6' (1.829 m), weight 203 lb 3.2 oz (92.2 kg), SpO2 99 %.    Resp: Lungs clear bilaterally Cardio: Regular rhythm GI: No hepatomegaly, tender in the right upper quadrant Vascular: No leg edema   Portacath/PICC-without erythema  Medications: I have reviewed the patient's current medications.  Assessment/Plan: 1. Adenocarcinoma the gastroesophageal junction, status post an endoscopic biopsy of a distal esophagus mass and gastric cardia ulcer on 07/23/2015 ? Staging CT scans consistent withlocally metastatic adenopathy ? PET scan 08/07/2015 with hypermetabolic mass at the distal esophagus/GE junction and a hypermetabolic paraesophageal, gastrohepatic, and portacaval lymph nodes ? Initiation of radiation 08/12/2015 with completion 09/24/2015; cycle 1 Taxol/carboplatin 08/13/2015; Final cycle given 09/22/2015. ? CT and a/pelvis 04/01/2016 with improvement of the esophageal tumor, decreased paraesophageal lymph nodes, abnormal wall thickening of the gastric antrum, new bilobed mass between the aorta and vena cava, ill-defined low-attenuation segment 5 hepatic lesion ? CTs 08/08/2016-stable abdominal lymph nodes, enlarging right liver lesion, persistent thickening at the distal esophagus ? 08/25/2016 biopsy of liver lesion with pathology showing metastatic adenocarcinoma consistent with esophageal primary ? Foundation 1 testing on the liver biopsy found the tumor to be microsatellite  stable with an intermediate tumor mutation burden, no alteration in Her-2 ? PDL1 0% 2. History of coronary artery disease 3. Congestive heart failure 4. History of ventricular tachycardia and supraventricular tachycardia 5. Peripheral arterial disease 6. Implantable defibrillator 7. Diabetes 8. Gout 9. Hospitalization from 09/10/15 to 09/15/15 due to Acute Hypoxic Respiratory Failure and Sepsis as well as new onset of Atrial Fibrillation, 10. Hospitalization 09/30/2015 through 10/02/2015 with probable UTI/bronchitis. 11. Hospitalization 10/05/2015 with rapid atrial fibrillation 12. 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 Hospitalization with GI bleed 02/17/2016 through 02/22/2016 status post upper endoscopy 02/18/2016 with the source of bleeding felt to be hemorrhagic gastritis. There was no evidence of residual mass in the distal esophagus. He continues twice daily Protonix. 14. Anemia most likely due to gastrointestinal bleeding. Improved.  Upper endoscopy 04/05/2016 showed nonbleeding esophageal and proximal gastric ulcers. Localized hemorrhagic mucosa with bleeding and stigmata of recent bleeding found in the cardia, gastric fundus, greater curvature of the stomach and lesser curvature of the stomach. Fulguration was performed.  Upper endoscopy 04/28/2016 showed a small hiatal hernia; one distal linear esophageal ulcer with no bleeding and no stigmata of recent bleeding; multiple stigmata of recent bleeding angiodysplastic lesions or telangiectasias from radiation found in the cardia, gastric fundus, greater curvature of the stomach, lesser curvature of the stomach and at the incisura; one nonbleeding cratered gastric ulcer found in the cardia. 15. Thrombocytopenia 16. Pain secondary to metastatic gastroesophageal carcinoma involving the liver   Disposition:  Walter French has metastatic gastroesophageal carcinoma. He is enrolled in  the [facility] program. He will continue the current narcotic regimen for management of pain related to liver and lymph node metastases.  The dysphagia may be related to local progression of tumor or a radiation stricture. He will call us if the dysphagia progresses  and we will make a GI referral to consider an endoscopy to see if he may be a candidate for a palliative dilation.  The Port-A-Cath was flushed today. He will continue Port-A-Cath flushes with the home hospice RN. Walter French will return for an office visit in one month.   Betsy Coder, MD  12/09/2016  9:47 AM

## 2016-12-13 NOTE — Telephone Encounter (Signed)
Close encounter 

## 2016-12-20 ENCOUNTER — Other Ambulatory Visit: Payer: Self-pay | Admitting: Nurse Practitioner

## 2016-12-22 ENCOUNTER — Other Ambulatory Visit: Payer: Self-pay | Admitting: Oncology

## 2016-12-22 DIAGNOSIS — C160 Malignant neoplasm of cardia: Secondary | ICD-10-CM

## 2016-12-24 NOTE — Progress Notes (Deleted)
Walter French Date of Birth: 05/23/1946 Medical Record [id number]  History of Present Illness: Walter French is seen  for follow up of CAD, atrial fibrillation, and CHF. He has known CAD. He presented in 1997 with acute MI and refractory VT/VF with severe 3 vessel CAD. He underwent emergent CABG and had an ICD implanted. In 2008 he presented with recurrent angina and graft failure and had repeat CABG by Dr. Prescott Gum with LIMA to the LAD, and sequential SVG to the PDA and PLOM. He subsequently had  PCI of the anastomosis of the LIMA to the LAD. This was a difficult POBA procedure with modest success. In August 2016 he had his last cardiac cath due to chest pain and abnormal Myoview. This showed all grafts were patent and the LIMA anastomosis appeared improved. EF was 30-35% by Echo.   In the summer of 2016 he was diagnosed with adenoCA of the GE junction. He was treated with radiation and chemotherapy. He was admitted in September 2016 with sepsis and new onset Afib with RVR. He was started on anticoagulation with Eliquis and treated with rate control.   He was readmitted with persistent afib and worsening CHF approximately 3 weeks later. He had DCCV but did not hold NSR. He was loaded with amiodarone. In late November 2016 he was admitted with respiratory failure and amiodarone pulmonary toxicity. He did have successful DCCV on 11/09/15 but amiodarone had to be discontinued due to toxicity.   He developed recurrent GI bleeds in early 2017.  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 He was treated with PPI and sulcrafate. His Eliquis was stopped. He has required a total of 18 units of PRBCs.   His other issues include HLD, gout, HTN, VT with ICD in place, OSA, DM and obesity. He has had angiography and stent to the right popliteal artery in Feb. 2014 by Dr. Fletcher Anon. Last evaluation  in February 2016 was stable.  He reports that his claudication now is really fairly mild.   When  seen in October 2017 he had recurrent and metastatic cancer. He had a new hepatic mass and biopsy confirmed adenoCA. He decided against further aggressive chemo or RT. Now in Hospice. Has progressive dysphagia.    Current Outpatient Prescriptions  Medication Sig Dispense Refill  . allopurinol (ZYLOPRIM) 300 MG tablet Take 300 mg by mouth daily.      . carvedilol (COREG) 6.25 MG tablet take 1 tablet by mouth twice a day with food 60 tablet 9  . DIGITEK 125 MCG tablet take 1 tablet by mouth once daily 30 tablet 2  . ferrous sulfate 325 (65 FE) MG EC tablet Take 325 mg by mouth daily with breakfast.    . furosemide (LASIX) 40 MG tablet Take 1 tablet (40 mg total) by mouth daily. (Patient not taking: Reported on 12/09/2016) 30 tablet 6  . levothyroxine (SYNTHROID, LEVOTHROID) 75 MCG tablet Take 75 mcg by mouth daily before breakfast.   0  . lisinopril (PRINIVIL,ZESTRIL) 5 MG tablet Take 1 tablet (5 mg total) by mouth daily. 30 tablet 6  . LORazepam (ATIVAN) 0.5 MG tablet take 1 tablet by mouth at bedtime if needed for anxiety or sleep 30 tablet 0  . methadone (DOLOPHINE) 5 MG tablet Take 5 mg by mouth every 8 (eight) hours.    . nitroGLYCERIN (NITROSTAT) 0.4 MG SL tablet Place 1 tablet (0.4 mg total) under the tongue every 5 (five) minutes x 3 doses as needed for chest  pain. (Patient not taking: Reported on 12/09/2016) 25 tablet 3  . oxyCODONE (OXY IR/ROXICODONE) 5 MG immediate release tablet 1-2 tabs PO Q 6 hours PRN. (Patient taking differently: every 4 (four) hours as needed. 1-2 tabs PO Q 6 hours PRN.) 45 tablet 0  . potassium chloride SA (K-DUR,KLOR-CON) 20 MEQ tablet Take 1 tablet (20 mEq total) by mouth daily. (Patient not taking: Reported on 12/09/2016) 30 tablet 5  . prochlorperazine (COMPAZINE) 10 MG tablet Take 10 mg by mouth every 6 (six) hours as needed for nausea or vomiting.    . ranitidine (ZANTAC) 300 MG tablet take 1 tablet by mouth once daily if needed for HEARTBURN 30 tablet 6    No current facility-administered medications for this visit.     Allergies  Allergen Reactions  . Adhesive [Tape] Other (See Comments)    Pulls skin off / please use paper tape  . Levaquin [Levofloxacin In D5w] Nausea And Vomiting  . Morphine And Related Nausea And Vomiting    Past Medical History:  Diagnosis Date  . A-fib ([place])    a. 11/09/2015: successful DCCV  . Allergy   . Anemia   . Anxiety   . Asthma    "small touch" (07/29/2013)  . Blood transfusion without reported diagnosis   . CHF (congestive heart failure) ([place])   . CHF (congestive heart failure) ([place])   . Coronary heart disease    a. s/p CABG x 3 (VG->PDA, VG->PL, LIMA->LAD);  b. 2013 Abnl Myoview;  c. 2013 Cath: 3/3 patent grafts, occluded LCX which correlated w/ ischemia on MV->not amenable to PCI->Med Rx.  . GERD (gastroesophageal reflux disease)   . Gout   . H/O hiatal hernia   . History of blood transfusion   . History of pneumonia    "3-4 times; last time ~ 2003" (07/29/2013)  . Hyperlipidemia   . Hyperlipidemia   . Hypertension   . Implantable cardioverter-defibrillator Mdt   . Obesity   . OSA on CPAP    not  using  . PAD (peripheral artery disease) ([place])    Angiography in February of 2014: Moderate left iliac artery stenosis, mild to moderate diffuse right SFA disease. Severe proximal right popliteal artery stenosis with three-vessel runoff below the knee. Status post self-expanding stent placement to the proximal popliteal artery  . PAD (peripheral artery disease) ([place])   . S/P radiation therapy 08/12/15-09/24/15   Ge  junction 54 Gy  . Shortness of breath dyspnea    "has been sob all my life"  . Sleep apnea   . Stomach cancer ([place]) 07/23/2015   invasive adenocarcinoma ,esophagus ,distal   . SVT (supraventricular tachycardia) ([place])   . Type II diabetes mellitus ([facility])    no longer on insulin  . VT (ventricular tachycardia) ([place])     Past Surgical History:  Procedure Laterality Date  .  ABDOMINAL AORTAGRAM N/A 01/30/2013   Procedure: ABDOMINAL Maxcine Ham;  Surgeon: Wellington Hampshire, MD;  Location: [facility] CATH LAB;  Service: Cardiovascular;  Laterality: N/A;  . CARDIAC CATHETERIZATION    . CARDIAC CATHETERIZATION N/A 07/22/2015   Procedure: Left Heart Cath and Coronary Angiography;  Surgeon: Eboni Coval M Martinique, MD;  Location: [facility] CV LAB;  Service: Cardiovascular;  Laterality: N/A;  . CARDIAC [place]; ~ 2003  . CARDIOVERSION N/A 11/09/2015   Procedure: CARDIOVERSION;  Surgeon: Pixie Casino, MD;  Location: [facility] ENDOSCOPY;  Service: Cardiovascular;  Laterality: N/A;  . CATARACT EXTRACTION W/ INTRAOCULAR LENS IMPLANT Left   .  CORONARY ARTERY BYPASS GRAFT  1997; 2007   "X3" (07/29/2013)  . ESOPHAGOGASTRODUODENOSCOPY N/A 07/23/2015   Procedure: ESOPHAGOGASTRODUODENOSCOPY (EGD);  Surgeon: Wonda Horner, MD;  Location: [facility] ENDOSCOPY;  Service: Endoscopy;  Laterality: N/A;  . ESOPHAGOGASTRODUODENOSCOPY N/A 02/18/2016   Procedure: ESOPHAGOGASTRODUODENOSCOPY (EGD);  Surgeon: Ronald Lobo, MD;  Location: [facility] ENDOSCOPY;  Service: Endoscopy;  Laterality: N/A;  . ESOPHAGOGASTRODUODENOSCOPY (EGD) WITH PROPOFOL N/A 04/05/2016   Procedure: ESOPHAGOGASTRODUODENOSCOPY (EGD) WITH PROPOFOL;  Surgeon: Clarene Essex, MD;  Location[facility] ENDOSCOPY;  Service: Endoscopy;  Laterality: N/A;  . ESOPHAGOGASTRODUODENOSCOPY (EGD) WITH PROPOFOL N/A 04/28/2016   Procedure: ESOPHAGOGASTRODUODENOSCOPY (EGD) WITH PROPOFOL;  Surgeon: Clarene Essex, MD;  Location: [facility] ENDOSCOPY;  Service: Endoscopy;  Laterality: N/A;  . HOT HEMOSTASIS N/A 04/05/2016   Procedure: HOT HEMOSTASIS (ARGON PLASMA COAGULATION/BICAP);  Surgeon: Clarene Essex, MD;  Location: [facility] ENDOSCOPY;  Service: Endoscopy;  Laterality: N/A;  . HOT HEMOSTASIS N/A 04/28/2016   Procedure: HOT HEMOSTASIS (ARGON PLASMA COAGULATION/BICAP);  Surgeon: Clarene Essex, MD;  Location: [facility] ENDOSCOPY;  Service: Endoscopy;  Laterality: N/A;  . IR GENERIC HISTORICAL   09/02/2016   IR FLUORO GUIDE PORT INSERTION RIGHT 09/02/2016 Arne Cleveland, MD [facility]-INTERV RAD  . IR GENERIC HISTORICAL  09/02/2016   IR US GUIDE VASC ACCESS RIGHT 09/02/2016 Arne Cleveland, MD [facility]INTER[facility]  . NERVE, TENDON AND ARTERY REPAIR Left 09/20/2013   Procedure: IRRIGATION AND DEBRIDEMENT,Exploration and Repair of Ulnar/Digital  Artery Nerve of Left Index finger;  Surgeon: Roseanne Kaufman, MD;  Location: [facility];  Service: Orthopedics;  Laterality: Left;  . TEE WITHOUT CARDIOVERSION N/A 10/06/2015   Procedure: TRANSESOPHAGEAL ECHOCARDIOGRAM (TEE);  Surgeon: Pixie Casino, MD;  Location: [facility] ENDOSCOPY;  Service: Cardiovascular;  Laterality: N/A;  . TONSILLECTOMY    . WRIST FRACTURE SURGERY Right     History  Smoking Status  . Former Smoker  . Packs/day: 2.00  . Years: 30.00  . Types: Cigarettes  . Quit date: 12/13/1995  Smokeless Tobacco  . Never Used    History  Alcohol Use No    Comment: 07/29/2013 "not touched any alcohol since 1997"    Family History  Problem Relation Age of Onset  . Heart disease Brother   . Cancer Brother   . Breast cancer Mother     Review of Systems: The review of systems is per the HPI.  All other systems were reviewed and are negative.  Physical Exam: There were no vitals taken for this visit. Patient is very pleasant and in no acute distress. Skin is warm and dry. Color is normal.  HEENT is unremarkable. Normocephalic/atraumatic. PERRL. Sclera are nonicteric. Neck is supple. No masses. No JVD. Lungs are clear. Cardiac exam shows a regular rate and rhythm. No gallop. No murmur.  Abdomen is soft. Extremities are without edema. Feet are warm and dry. Gait and ROM are intact. No gross neurologic deficits noted.  LABORATORY DATA: Lab Results  Component Value Date   WBC 7.4 11/16/2016   HGB 12.4 (L) 11/16/2016   HCT 38.6 11/16/2016   PLT 120 (L) 11/16/2016   GLUCOSE 122 11/16/2016   CHOL 138 07/30/2013   TRIG 321 (H) 07/30/2013   HDL 25 (L)  07/30/2013   LDLCALC 49 07/30/2013   ALT 26 11/16/2016   AST 46 (H) 11/16/2016   NA 138 11/16/2016   K 4.9 11/16/2016   CL 106 09/02/2016   CREATININE 1.1 11/16/2016   BUN 14.8 11/16/2016   CO2 26 11/16/2016   TSH 0.588 11/09/2015   INR  1.06 09/02/2016   HGBA1C 5.6 11/12/2015   Labs reviewed from 11/30/16:  cholesterol 188, triglycerides 164, HDL 38, LDL 118. A1c 5.7%. BMET normal  Assessment / Plan: 1. CAD - will continue with medical management. Last cath in August 2016 showed patent grafts and he is asymptomatic.    2. Atrial fibrillation. S/p DCCV in November 2016. Afib started in setting of sepsis and led to worsening CHF. Intolerant of amiodarone due to pulmonary toxicity. Fortunately maintaining NSR. Recurrent UGI bleeds preclude anticoagulation.   3. Chronic systolic CHF.  Ischemic CM EF 30-35%. Well compensated on current therapy. Continue current lasix. Lisinopril, and Coreg dose. On Digoxin  4. PAD - s/p  popliteal stent. Symptoms improved.  5. VT with underlying ICD - followed by Dr. Caryl Comes.   6. DM - reports good control.  7. Recurrent UGI bleed secondary to radiation induced esophagitis/gastritis. On lifelong PPI and sucralfate per GI. Not a candidate for anticoagulation or anti platelet therapy.  8. 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 We discussed this at length. He wanted my opinion on what to do. I told him my knowledge is limited in this area but my understanding is that his cancer is not curable at this stage. The goal of chemotherapy is to extend his life and keep cancer from progressing as quickly. Will defer to Dr. Benay Spice what the expectation of gain from chemotherapy is. I do think it is likely that he will die from his cancer now. We discussed trying to limit his medication to just the essentials now and for this reason will stop lipitor for now. I think his other therapy is essential for continued CHF management.   I will follow up  in 3 months.   Follow up in 4 months.

## 2016-12-28 ENCOUNTER — Telehealth: Payer: Self-pay | Admitting: *Deleted

## 2016-12-28 NOTE — Telephone Encounter (Signed)
Pt describes pain with swallowing and food getting stuck last night. Today ,he ate a bowl of cheerios that went down. He said "the softer the food is the harder it is to get down" Per Dr Benay Spice I instructed pt to contact Dr Watt Climes and to go on a liquid diet.  I also left message with his hospice nurse with this same information.

## 2016-12-28 NOTE — Telephone Encounter (Signed)
Patient called stating that he is having more trouble swallowing. (ex: tried to eat mashed potatoes last night, but was having such a difficult time that he had to throw it up). RN Lorriane Shire notified.

## 2016-12-29 ENCOUNTER — Ambulatory Visit: Payer: PPO | Admitting: Cardiology

## 2016-12-30 ENCOUNTER — Ambulatory Visit (INDEPENDENT_AMBULATORY_CARE_PROVIDER_SITE_OTHER): Payer: PPO | Admitting: Cardiology

## 2016-12-30 ENCOUNTER — Encounter: Payer: Self-pay | Admitting: Cardiology

## 2016-12-30 VITALS — BP 136/80 | HR 88 | Ht 72.0 in | Wt 195.4 lb

## 2016-12-30 DIAGNOSIS — I2510 Atherosclerotic heart disease of native coronary artery without angina pectoris: Secondary | ICD-10-CM | POA: Diagnosis not present

## 2016-12-30 DIAGNOSIS — I5042 Chronic combined systolic (congestive) and diastolic (congestive) heart failure: Secondary | ICD-10-CM

## 2016-12-30 DIAGNOSIS — I255 Ischemic cardiomyopathy: Secondary | ICD-10-CM | POA: Diagnosis not present

## 2016-12-30 DIAGNOSIS — I4729 Other ventricular tachycardia: Secondary | ICD-10-CM

## 2016-12-30 DIAGNOSIS — I5022 Chronic systolic (congestive) heart failure: Secondary | ICD-10-CM

## 2016-12-30 DIAGNOSIS — I472 Ventricular tachycardia: Secondary | ICD-10-CM

## 2016-12-30 NOTE — Progress Notes (Signed)
Walter French Date of Birth: 1946/08/02 Medical Record [id number]  History of Present Illness: Walter French is seen  for follow up of CAD, atrial fibrillation, and CHF. He has known CAD. He presented in 1997 with acute MI and refractory VT/VF with severe 3 vessel CAD. He underwent emergent CABG and had an ICD implanted. In 2008 he presented with recurrent angina and graft failure and had repeat CABG by Dr. Prescott Gum with LIMA to the LAD, and sequential SVG to the PDA and PLOM. He subsequently had  PCI of the anastomosis of the LIMA to the LAD. This was a difficult POBA procedure with modest success. In August 2016 he had his last cardiac cath due to chest pain and abnormal Myoview. This showed all grafts were patent and the LIMA anastomosis appeared improved. EF was 30-35% by Echo.   In the summer of 2016 he was diagnosed with adenoCA of the GE junction. He was treated with radiation and chemotherapy. He was admitted in September 2016 with sepsis and new onset Afib with RVR. He was started on anticoagulation with Eliquis and treated with rate control.   He was readmitted with persistent afib and worsening CHF approximately 3 weeks later. He had DCCV but did not hold NSR. He was loaded with amiodarone. In late November 2016 he was admitted with respiratory failure and amiodarone pulmonary toxicity. He did have successful DCCV on 11/09/15 but amiodarone had to be discontinued due to toxicity.   He developed recurrent GI bleeds last year.  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 He was treated with PPI and sulcrafate. His Eliquis was stopped. He has required a total of 18 units of PRBCs.   His other issues include HLD, gout, HTN, VT with ICD in place, OSA, DM and obesity. He has had angiography and stent to the right popliteal artery in Feb. 2014 by Dr. Fletcher Anon. Last evaluation  in February 2016 was stable.   On follow up today he has progressive stage IV cancer. He is now on  Hospice. No plans for further chemotherapy or RT. Complains of poor appetite with daily N/V. Some diffuse abdominal pain. Wants to discuss ICD therapy.   Current Outpatient Prescriptions  Medication Sig Dispense Refill  . carvedilol (COREG) 6.25 MG tablet take 1 tablet by mouth twice a day with food 60 tablet 9  . DIGITEK 125 MCG tablet take 1 tablet by mouth once daily 30 tablet 2  . ferrous sulfate 325 (65 FE) MG EC tablet Take 325 mg by mouth daily with breakfast.    . levothyroxine (SYNTHROID, LEVOTHROID) 75 MCG tablet Take 75 mcg by mouth daily before breakfast.   0  . LORazepam (ATIVAN) 0.5 MG tablet take 1 tablet by mouth at bedtime if needed for anxiety or sleep 30 tablet 0  . methadone (DOLOPHINE) 10 MG tablet Take 10 mg by mouth 3 (three) times daily.  0  . nitroGLYCERIN (NITROSTAT) 0.4 MG SL tablet Place 1 tablet (0.4 mg total) under the tongue every 5 (five) minutes x 3 doses as needed for chest pain. 25 tablet 3  . oxyCODONE (OXY IR/ROXICODONE) 5 MG immediate release tablet 1-2 tabs PO Q 6 hours PRN. (Patient taking differently: every 4 (four) hours as needed. 1-2 tabs PO Q 6 hours PRN.) 45 tablet 0  . prochlorperazine (COMPAZINE) 10 MG tablet Take 10 mg by mouth every 6 (six) hours as needed for nausea or vomiting.    . ranitidine (ZANTAC) 300 MG tablet  take 1 tablet by mouth once daily if needed for HEARTBURN 30 tablet 6   No current facility-administered medications for this visit.     Allergies  Allergen Reactions  . Adhesive [Tape] Other (See Comments)    Pulls skin off / please use paper tape  . Levaquin [Levofloxacin In D5w] Nausea And Vomiting  . Morphine And Related Nausea And Vomiting    Past Medical History:  Diagnosis Date  . A-fib ([place])    a. 11/09/2015: successful DCCV  . Allergy   . Anemia   . Anxiety   . Asthma    "small touch" (07/29/2013)  . Blood transfusion without reported diagnosis   . CHF (congestive heart failure) ([place])   . CHF (congestive  heart failure) ([place])   . Coronary heart disease    a. s/p CABG x 3 (VG->PDA, VG->PL, LIMA->LAD);  b. 2013 Abnl Myoview;  c. 2013 Cath: 3/3 patent grafts, occluded LCX which correlated w/ ischemia on MV->not amenable to PCI->Med Rx.  . GERD (gastroesophageal reflux disease)   . Gout   . H/O hiatal hernia   . History of blood transfusion   . History of pneumonia    "3-4 times; last time ~ 2003" (07/29/2013)  . Hyperlipidemia   . Hyperlipidemia   . Hypertension   . Implantable cardioverter-defibrillator Mdt   . Obesity   . OSA on CPAP    not  using  . PAD (peripheral artery disease) ([place])    Angiography in February of 2014: Moderate left iliac artery stenosis, mild to moderate diffuse right SFA disease. Severe proximal right popliteal artery stenosis with three-vessel runoff below the knee. Status post self-expanding stent placement to the proximal popliteal artery  . PAD (peripheral artery disease) ([place])   . S/P radiation therapy 08/12/15-09/24/15   Ge  junction 54 Gy  . Shortness of breath dyspnea    "has been sob all my life"  . Sleep apnea   . Stomach cancer ([place]) 07/23/2015   invasive adenocarcinoma ,esophagus ,distal   . SVT (supraventricular tachycardia) ([place])   . Type II diabetes mellitus ([facility])    no longer on insulin  . VT (ventricular tachycardia) ([place])     Past Surgical History:  Procedure Laterality Date  . ABDOMINAL AORTAGRAM N/A 01/30/2013   Procedure: ABDOMINAL Maxcine Ham;  Surgeon: Wellington Hampshire, MD;  Location: [facility] CATH LAB;  Service: Cardiovascular;  Laterality: N/A;  . CARDIAC CATHETERIZATION    . CARDIAC CATHETERIZATION N/A 07/22/2015   Procedure: Left Heart Cath and Coronary Angiography;  Surgeon: Peter M Martinique, MD;  Location: [facility] CV LAB;  Service: Cardiovascular;  Laterality: N/A;  . CARDIAC [place]; ~ 2003  . CARDIOVERSION N/A 11/09/2015   Procedure: CARDIOVERSION;  Surgeon: Pixie Casino, MD;  Location: [facility] ENDOSCOPY;  Service:  Cardiovascular;  Laterality: N/A;  . CATARACT EXTRACTION W/ INTRAOCULAR LENS IMPLANT Left   . CORONARY ARTERY BYPASS GRAFT  1997; 2007   "X3" (07/29/2013)  . ESOPHAGOGASTRODUODENOSCOPY N/A 07/23/2015   Procedure: ESOPHAGOGASTRODUODENOSCOPY (EGD);  Surgeon: Wonda Horner, MD;  Location: [facility] ENDOSCOPY;  Service: Endoscopy;  Laterality: N/A;  . ESOPHAGOGASTRODUODENOSCOPY N/A 02/18/2016   Procedure: ESOPHAGOGASTRODUODENOSCOPY (EGD);  Surgeon: Ronald Lobo, MD;  Location: [facility] ENDOSCOPY;  Service: Endoscopy;  Laterality: N/A;  . ESOPHAGOGASTRODUODENOSCOPY (EGD) WITH PROPOFOL N/A 04/05/2016   Procedure: ESOPHAGOGASTRODUODENOSCOPY (EGD) WITH PROPOFOL;  Surgeon: Clarene Essex, MD;  Location[facility] ENDOSCOPY;  Service: Endoscopy;  Laterality: N/A;  . ESOPHAGOGASTRODUODENOSCOPY (EGD) WITH PROPOFOL N/A 04/28/2016  Procedure: ESOPHAGOGASTRODUODENOSCOPY (EGD) WITH PROPOFOL;  Surgeon: Clarene Essex, MD;  Location: [facility] ENDOSCOPY;  Service: Endoscopy;  Laterality: N/A;  . HOT HEMOSTASIS N/A 04/05/2016   Procedure: HOT HEMOSTASIS (ARGON PLASMA COAGULATION/BICAP);  Surgeon: Clarene Essex, MD;  Location: [facility] ENDOSCOPY;  Service: Endoscopy;  Laterality: N/A;  . HOT HEMOSTASIS N/A 04/28/2016   Procedure: HOT HEMOSTASIS (ARGON PLASMA COAGULATION/BICAP);  Surgeon: Clarene Essex, MD;  Location: [facility] ENDOSCOPY;  Service: Endoscopy;  Laterality: N/A;  . IR GENERIC HISTORICAL  09/02/2016   IR FLUORO GUIDE PORT INSERTION RIGHT 09/02/2016 Arne Cleveland, MD [facility]-INTER[facility] RAD  . IR GENERIC HISTORICAL  09/02/2016   IR US GUIDE VASC ACCESS RIGHT 09/02/2016 Arne Cleveland, MD [facility]  . NERVE, TENDON AND ARTERY REPAIR Left 09/20/2013   Procedure: IRRIGATION AND DEBRIDEMENT,Exploration and Repair of Ulnar/Digital  Artery Nerve of Left Index finger;  Surgeon: Roseanne Kaufman, MD;  Location: [facility];  Service: Orthopedics;  Laterality: Left;  . TEE WITHOUT CARDIOVERSION N/A 10/06/2015   Procedure: TRANSESOPHAGEAL ECHOCARDIOGRAM (TEE);  Surgeon: Pixie Casino, MD;  Location: [facility] ENDOSCOPY;  Service: Cardiovascular;  Laterality: N/A;  . TONSILLECTOMY    . WRIST FRACTURE SURGERY Right     History  Smoking Status  . Former Smoker  . Packs/day: 2.00  . Years: 30.00  . Types: Cigarettes  . Quit date: 12/13/1995  Smokeless Tobacco  . Never Used    History  Alcohol Use No    Comment: 07/29/2013 "not touched any alcohol since 1997"    Family History  Problem Relation Age of Onset  . Heart disease Brother   . Cancer Brother   . Breast cancer Mother     Review of Systems: The review of systems is per the HPI.  All other systems were reviewed and are negative.  Physical Exam: BP 136/80   Pulse 88   Ht 6' (1.829 m)   Wt 195 lb 6.4 oz (88.6 kg)   BMI 26.50 kg/m  Patient is very pleasant and in no acute distress. Skin is warm and dry. Color is normal.  HEENT is unremarkable. Normocephalic/atraumatic. PERRL. Sclera are nonicteric. Neck is supple. No masses. No JVD. Lungs are clear. Cardiac exam shows a regular rate and rhythm. No gallop. No murmur.  Abdomen is soft. Extremities are without edema. Feet are warm and dry. Gait and ROM are intact. No gross neurologic deficits noted.  LABORATORY DATA: Lab Results  Component Value Date   WBC 7.4 11/16/2016   HGB 12.4 (L) 11/16/2016   HCT 38.6 11/16/2016   PLT 120 (L) 11/16/2016   GLUCOSE 122 11/16/2016   CHOL 138 07/30/2013   TRIG 321 (H) 07/30/2013   HDL 25 (L) 07/30/2013   LDLCALC 49 07/30/2013   ALT 26 11/16/2016   AST 46 (H) 11/16/2016   NA 138 11/16/2016   K 4.9 11/16/2016   CL 106 09/02/2016   CREATININE 1.1 11/16/2016   BUN 14.8 11/16/2016   CO2 26 11/16/2016   TSH 0.588 11/09/2015   INR 1.06 09/02/2016   HGBA1C 5.6 11/12/2015   Labs reviewed from 06/07/16: cholesterol 109, triglycerides 62, HDL 35, LDL 70. Last A1c in August 5.8%.   Assessment / Plan: 1. CAD - will continue with medical management. Last cath in August 2016 showed patent grafts and he is asymptomatic.     2. Atrial fibrillation. S/p DCCV in November 2016. Afib started in setting of sepsis and led to worsening CHF. Intolerant of amiodarone due to pulmonary toxicity. Fortunately maintaining NSR.  Recurrent UGI bleeds preclude anticoagulation.   3. Chronic systolic CHF.  Ischemic CM EF 30-35%. Well compensated on current therapy. Continue current lasix. Lisinopril, and Coreg dose. On Digoxin  4. PAD - s/p  popliteal stent. Symptoms improved.  5. VT with underlying ICD - discussed in light of current stage IV CA with poor prognosis and now on Hospice. Decided to turn device therapies off and I performed this in office today.   6. DM  7. Recurrent UGI bleed secondary to radiation induced esophagitis/gastritis. On lifelong PPI and sucralfate per GI. Not a candidate for anticoagulation or anti platelet therapy.  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 Poor prognosis on Hospice now.  I will follow up prn

## 2017-01-02 ENCOUNTER — Telehealth: Payer: Self-pay | Admitting: Cardiology

## 2017-01-02 NOTE — Telephone Encounter (Signed)
Returned call to patient's friend Tamela Oddi she was calling to see if he needs follow up appointment with Dr.Jordan.Stated patient told her after appointment with Dr.Jordan he did not have a lot of time left to live.She knows patient has stage 4 cancer and Hospice is seeing patient once a week.Advised patient is to follow up with Dr.Jordan as needed.

## 2017-01-02 NOTE — Telephone Encounter (Signed)
New Message  Pts friend (DPR on file) [name] voiced she would like for nurse to give her a call regarding prior appt.  Please f/u

## 2017-01-04 ENCOUNTER — Ambulatory Visit
Admission: RE | Admit: 2017-01-04 | Discharge: 2017-01-04 | Disposition: A | Discharge status: Home / Self Care | Payer: PPO | Source: Ambulatory Visit | Attending: Gastroenterology | Admitting: Gastroenterology

## 2017-01-04 ENCOUNTER — Other Ambulatory Visit: Payer: Self-pay | Admitting: Gastroenterology

## 2017-01-04 DIAGNOSIS — R933 Abnormal findings on diagnostic imaging of other parts of digestive tract: Secondary | ICD-10-CM | POA: Diagnosis not present

## 2017-01-04 DIAGNOSIS — R1013 Epigastric pain: Secondary | ICD-10-CM | POA: Diagnosis not present

## 2017-01-04 DIAGNOSIS — R1311 Dysphagia, oral phase: Secondary | ICD-10-CM | POA: Diagnosis not present

## 2017-01-04 DIAGNOSIS — R1319 Other dysphagia: Secondary | ICD-10-CM

## 2017-01-04 DIAGNOSIS — D500 Iron deficiency anemia secondary to blood loss (chronic): Secondary | ICD-10-CM | POA: Diagnosis not present

## 2017-01-04 DIAGNOSIS — R1310 Dysphagia, unspecified: Secondary | ICD-10-CM

## 2017-01-04 DIAGNOSIS — C159 Malignant neoplasm of esophagus, unspecified: Secondary | ICD-10-CM | POA: Diagnosis not present

## 2017-01-06 ENCOUNTER — Ambulatory Visit (HOSPITAL_BASED_OUTPATIENT_CLINIC_OR_DEPARTMENT_OTHER): Payer: PPO | Admitting: Nurse Practitioner

## 2017-01-06 VITALS — BP 150/70 | HR 90 | Temp 98.2°F | Resp 18 | Ht 72.0 in | Wt 195.0 lb

## 2017-01-06 DIAGNOSIS — C786 Secondary malignant neoplasm of retroperitoneum and peritoneum: Secondary | ICD-10-CM

## 2017-01-06 DIAGNOSIS — G893 Neoplasm related pain (acute) (chronic): Secondary | ICD-10-CM

## 2017-01-06 DIAGNOSIS — E119 Type 2 diabetes mellitus without complications: Secondary | ICD-10-CM

## 2017-01-06 DIAGNOSIS — C787 Secondary malignant neoplasm of liver and intrahepatic bile duct: Secondary | ICD-10-CM

## 2017-01-06 DIAGNOSIS — C160 Malignant neoplasm of cardia: Secondary | ICD-10-CM | POA: Diagnosis not present

## 2017-01-06 DIAGNOSIS — R1310 Dysphagia, unspecified: Secondary | ICD-10-CM

## 2017-01-06 DIAGNOSIS — D649 Anemia, unspecified: Secondary | ICD-10-CM

## 2017-01-06 DIAGNOSIS — D696 Thrombocytopenia, unspecified: Secondary | ICD-10-CM

## 2017-01-06 DIAGNOSIS — K219 Gastro-esophageal reflux disease without esophagitis: Secondary | ICD-10-CM

## 2017-01-06 DIAGNOSIS — I4891 Unspecified atrial fibrillation: Secondary | ICD-10-CM

## 2017-01-06 DIAGNOSIS — C159 Malignant neoplasm of esophagus, unspecified: Secondary | ICD-10-CM

## 2017-01-06 NOTE — Progress Notes (Addendum)
[place] [facility] OFFICE PROGRESS NOTE   Diagnosis:  Gastroesophageal carcinoma  INTERVAL HISTORY:   Mr. Osten returns as scheduled. He is enrolled in the [facility] hospice program. He has intermittent pain at the upper abdomen. He takes methadone with oxycodone as needed. He continues to have dysphagia. He reports recent "x-rays".  Objective:  Vital signs in last 24 hours:  Blood pressure (!) 150/70, pulse 90, temperature 98.2 F (36.8 C), temperature source Oral, resp. rate 18, height 6' (1.829 m), weight 195 lb (88.5 kg), SpO2 98 %.    HEENT: No thrush or ulcers. Resp: Faint rales at the left lung base. Lungs otherwise clear. No respiratory distress. Cardio: Regular rate and rhythm. GI: Liver edge palpable right upper quadrant with associated mild tenderness. Vascular: No leg edema.  Port-A-Cath without erythema.  Lab Results:  Lab Results  Component Value Date   WBC 7.4 11/16/2016   HGB 12.4 (L) 11/16/2016   HCT 38.6 11/16/2016   MCV 87.6 11/16/2016   PLT 120 (L) 11/16/2016   NEUTROABS 5.4 11/16/2016    Imaging:  Dg Esophagus  Result Date: 01/04/2017 CLINICAL DATA:  History of esophageal carcinoma with prior radiation therapy, no surgery EXAM: ESOPHOGRAM/BARIUM SWALLOW TECHNIQUE: Single contrast examination was performed using  thin Alvester Chou. FLUOROSCOPY TIME:  Fluoroscopy Time:  54 seconds Radiation Exposure Index (if provided by the fluoroscopic device): 320 mGy Number of Acquired Spot Images: 0 COMPARISON:  CT chest of 11/17/2016 FINDINGS: The study was begun in the lateral projection to make sure no aspiration or penetration was present. The swallowing mechanism is unremarkable with no aspiration or penetration identified. There are moderate tertiary contractions within the somewhat ectatic thoracic esophagus. However no mucosal lesion is evident. No definite stricture is seen. There is significant gastroesophageal reflux present. IMPRESSION: 1. Tortuous  thoracic esophagus with moderate tertiary contractions. 2. No definite mucosal lesion or esophageal stricture is seen. 3. Significant gastroesophageal reflux. 4. No aspiration or penetration. Electronically Signed   By: Ivar Drape M.D.   On: 01/04/2017 13:13    Medications: I have reviewed the patient's current medications.  Assessment/Plan: 1. Adenocarcinoma the gastroesophageal junction, status post an endoscopic biopsy of a distal esophagus mass and gastric cardia ulcer on 07/23/2015 ? Staging CT scans consistent withlocally metastatic adenopathy ? PET scan 08/07/2015 with hypermetabolic mass at the distal esophagus/GE junction and a hypermetabolic paraesophageal, gastrohepatic, and portacaval lymph nodes ? Initiation of radiation 08/12/2015 with completion 09/24/2015; cycle 1 Taxol/carboplatin 08/13/2015; Final cycle given 09/22/2015. ? CT and a/pelvis 04/01/2016 with improvement of the esophageal tumor, decreased paraesophageal lymph nodes, abnormal wall thickening of the gastric antrum, new bilobed mass between the aorta and vena cava, ill-defined low-attenuation segment 5 hepatic lesion ? CTs 08/08/2016-stable abdominal lymph nodes, enlarging right liver lesion, persistent thickening at the distal esophagus ? 08/25/2016 biopsy of liver lesion with pathology showing metastatic adenocarcinoma consistent with esophageal primary ? Foundation 1 testing on the liver biopsy found the tumor to be microsatellite stable with an intermediate tumor mutation burden, no alteration in Her-2 ? PDL10% 2. History of coronary artery disease 3. Congestive heart failure 4. History of ventricular tachycardia and supraventricular tachycardia 5. Peripheral arterial disease 6. Implantable defibrillator 7. Diabetes 8. Gout 9. Hospitalization from 09/10/15 to 09/15/15 due to Acute Hypoxic Respiratory Failure and Sepsis as well as new onset of Atrial Fibrillation, 10. Hospitalization 09/30/2015 through  10/02/2015 with probable UTI/bronchitis. 11. Hospitalization 10/05/2015 with rapid atrial fibrillation 12. Hospitalization 11/07/2015 with respiratory failure felt to be  secondary to amiodarone toxicity 13. Hospitalization with GI bleed 02/17/2016 through 02/22/2016 status post upper endoscopy 02/18/2016 with the source of bleeding felt to be hemorrhagic gastritis. There was no evidence of residual mass in the distal esophagus. He continues twice daily Protonix. 14. Anemia most likely due to gastrointestinal bleeding. Improved.  Upper endoscopy 04/05/2016 showed nonbleeding esophageal and proximal gastric ulcers. Localized hemorrhagic mucosa with bleeding and stigmata of recent bleeding found in the cardia, gastric fundus, greater curvature of the stomach and lesser curvature of the stomach. Fulguration was performed.  Upper endoscopy 04/28/2016 showed a small hiatal hernia; one distal linear esophageal ulcer with no bleeding and no stigmata of recent bleeding; multiple stigmata of recent bleeding angiodysplastic lesions or telangiectasias from radiation found in the cardia, gastric fundus, greater curvature of the stomach, lesser curvature of the stomach and at the incisura; one nonbleeding cratered gastric ulcer found in the cardia. 15. Thrombocytopenia 16. 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 Dysphagia. Barium swallow 01/04/2017-no definite mucosal lesion or esophageal stricture seen. Significant gastroesophageal reflux.    Disposition: [name] overall appears unchanged. His performance status is slowly declining. The plan is to continue to follow on a supportive/comfort care approach. He is enrolled in the [facility] hospice program. He will follow-up with Dr. Watt Climes regarding the persistent dysphagia. We scheduled a return visit here in 4 weeks. He will contact the office in the interim with any problems.  Patient seen with Dr.  Benay Spice.    Ned Card ANP/GNP-BC   01/06/2017  10:32 AM This was a shared visit with Ned Card. The barium swallow does not show an esophagus stricture. He will follow-up with Dr. Watt Climes to discuss the dysphagia and management of reflux.  Julieanne Manson, M.D.

## 2017-01-18 ENCOUNTER — Telehealth: Payer: Self-pay | Admitting: Oncology

## 2017-01-18 NOTE — Telephone Encounter (Signed)
Called and confirmed with patient his appointment on 2/23 @ 9

## 2017-01-25 ENCOUNTER — Ambulatory Visit (INDEPENDENT_AMBULATORY_CARE_PROVIDER_SITE_OTHER): Payer: PPO | Admitting: *Deleted

## 2017-01-25 DIAGNOSIS — Z95810 Presence of automatic (implantable) cardiac defibrillator: Secondary | ICD-10-CM | POA: Diagnosis not present

## 2017-01-25 DIAGNOSIS — I255 Ischemic cardiomyopathy: Secondary | ICD-10-CM | POA: Diagnosis not present

## 2017-01-25 LAB — CUP PACEART INCLINIC DEVICE CHECK
Battery Voltage: 2.89 V
Brady Statistic RV Percent Paced: 0 %
Date Time Interrogation Session: 20180214112006
HighPow Impedance: 399 Ohm
HighPow Impedance: 52 Ohm
Implantable Lead Implant Date: 20031205
Implantable Lead Location: 753860
Implantable Lead Model: 6932
Lead Channel Impedance Value: 456 Ohm
Lead Channel Setting Pacing Amplitude: 2.5 V
Lead Channel Setting Pacing Pulse Width: 0.4 ms
Lead Channel Setting Sensing Sensitivity: 0.3 mV
MDC IDC MSMT LEADCHNL RV PACING THRESHOLD AMPLITUDE: 0.75 V
MDC IDC MSMT LEADCHNL RV PACING THRESHOLD PULSEWIDTH: 0.4 ms
MDC IDC MSMT LEADCHNL RV SENSING INTR AMPL: 31.5 mV
MDC IDC MSMT LEADCHNL RV SENSING INTR AMPL: 31.625 mV
MDC IDC PG IMPLANT DT: 20120827

## 2017-01-25 NOTE — Progress Notes (Signed)
ICD check in clinic for alert tone. Alert for Detection/Therapies OFF turned off. Threshold and sensing consistent with previous device measurements. Impedance trends stable over time. No evidence of any ventricular arrhythmias. Histogram distribution appropriate for patient and level of activity. Device programmed at appropriate safety margins. Device programmed to optimize intrinsic conduction. Battery voltage= 2.89V. ROV with Dr. Caryl Comes in July- TBD due to patient in [place] for CA.

## 2017-01-26 ENCOUNTER — Other Ambulatory Visit: Payer: Self-pay | Admitting: Oncology

## 2017-01-26 ENCOUNTER — Other Ambulatory Visit: Payer: Self-pay | Admitting: Cardiology

## 2017-01-26 DIAGNOSIS — C160 Malignant neoplasm of cardia: Secondary | ICD-10-CM

## 2017-01-26 NOTE — Telephone Encounter (Signed)
Rx(s) sent to pharmacy electronically. Lisinopril removed from med list during work up at last appt but MD note states to continue current dose of lisinopril.

## 2017-02-03 ENCOUNTER — Telehealth: Payer: Self-pay | Admitting: Oncology

## 2017-02-03 ENCOUNTER — Ambulatory Visit (HOSPITAL_BASED_OUTPATIENT_CLINIC_OR_DEPARTMENT_OTHER): Payer: PPO | Admitting: Oncology

## 2017-02-03 VITALS — BP 118/66 | HR 99 | Temp 97.7°F | Resp 18 | Ht 72.0 in | Wt 188.2 lb

## 2017-02-03 DIAGNOSIS — C787 Secondary malignant neoplasm of liver and intrahepatic bile duct: Secondary | ICD-10-CM | POA: Diagnosis not present

## 2017-02-03 DIAGNOSIS — C160 Malignant neoplasm of cardia: Secondary | ICD-10-CM | POA: Diagnosis not present

## 2017-02-03 DIAGNOSIS — G893 Neoplasm related pain (acute) (chronic): Secondary | ICD-10-CM | POA: Diagnosis not present

## 2017-02-03 DIAGNOSIS — D696 Thrombocytopenia, unspecified: Secondary | ICD-10-CM

## 2017-02-03 DIAGNOSIS — C786 Secondary malignant neoplasm of retroperitoneum and peritoneum: Secondary | ICD-10-CM | POA: Diagnosis not present

## 2017-02-03 DIAGNOSIS — R1310 Dysphagia, unspecified: Secondary | ICD-10-CM

## 2017-02-03 NOTE — Progress Notes (Signed)
[place] OFFICE PROGRESS NOTE   Diagnosis: Gastroesophageal carcinoma  INTERVAL HISTORY:   Walter French returns as scheduled. He complains of malaise. He also has a poor appetite, but is eating. No significant dysphagia. Abdominal pain is controlled with methadone and oxycodone. The hospice nurse is visiting weekly and flushed his Port-A-Cath.  Objective:  Vital signs in last 24 hours:  Blood pressure 118/66, pulse 99, temperature 97.7 F (36.5 C), temperature source Oral, resp. rate 18, height 6' (1.829 m), weight 188 lb 3.2 oz (85.4 kg), SpO2 100 %.    HEENT: Neck without mass Resp: Lungs clear bilaterally Cardio: Regular rate and rhythm GI: No hepatosplenomegaly, nontender Vascular: No leg edema    Portacath/PICC-without erythema     Medications: I have reviewed the patient's current medications.  Assessment/Plan: 1. Adenocarcinoma the gastroesophageal junction, status post an endoscopic biopsy of a distal esophagus mass and gastric cardia ulcer on 07/23/2015 ? Staging CT scans consistent withlocally metastatic adenopathy ? PET scan 08/07/2015 with hypermetabolic mass at the distal esophagus/GE junction and a hypermetabolic paraesophageal, gastrohepatic, and portacaval lymph nodes ? Initiation of radiation 08/12/2015 with completion 09/24/2015; cycle 1 Taxol/carboplatin 08/13/2015; Final cycle given 09/22/2015. ? CT and a/pelvis 04/01/2016 with improvement of the esophageal tumor, decreased paraesophageal lymph nodes, abnormal wall thickening of the gastric antrum, new bilobed mass between the aorta and vena cava, ill-defined low-attenuation segment 5 hepatic lesion ? CTs 08/08/2016-stable abdominal lymph nodes, enlarging right liver lesion, persistent thickening at the distal esophagus ? 08/25/2016 biopsy of liver lesion with pathology showing metastatic adenocarcinoma consistent with esophageal primary ? Foundation 1 testing on the liver biopsy found the  tumor to be microsatellite stable with an intermediate tumor mutation burden, no alteration in Her-2 ? PDL10% 2. History of coronary artery disease 3. Congestive heart failure 4. History of ventricular tachycardia and supraventricular tachycardia 5. Peripheral arterial disease 6. Implantable defibrillator 7. Diabetes 8. Gout 9. Hospitalization from 09/10/15 to 09/15/15 due to Acute Hypoxic Respiratory Failure and Sepsis as well as new onset of Atrial Fibrillation, 10. Hospitalization 09/30/2015 through 10/02/2015 with probable UTI/bronchitis. 11. Hospitalization 10/05/2015 with rapid atrial fibrillation 12. 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 Hospitalization with GI bleed 02/17/2016 through 02/22/2016 status post upper endoscopy 02/18/2016 with the source of bleeding felt to be hemorrhagic gastritis. There was no evidence of residual mass in the distal esophagus. He continues twice daily Protonix. 14. Anemia most likely due to gastrointestinal bleeding. Improved.  Upper endoscopy 04/05/2016 showed nonbleeding esophageal and proximal gastric ulcers. Localized hemorrhagic mucosa with bleeding and stigmata of recent bleeding found in the cardia, gastric fundus, greater curvature of the stomach and lesser curvature of the stomach. Fulguration was performed.  Upper endoscopy 04/28/2016 showed a small hiatal hernia; one distal linear esophageal ulcer with no bleeding and no stigmata of recent bleeding; multiple stigmata of recent bleeding angiodysplastic lesions or telangiectasias from radiation found in the cardia, gastric fundus, greater curvature of the stomach, lesser curvature of the stomach and at the incisura; one nonbleeding cratered gastric ulcer found in the cardia. 15. Thrombocytopenia 16. 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 Dysphagia. Barium swallow 01/04/2017-no definite mucosal  lesion or esophageal stricture seen. Significant gastroesophageal reflux.   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 His performance status is declining. He will continue the current narcotic regimen. He is followed by the home Hospice team. His Port-A-Cath will be flushed by the hospice R  in. Mr. Dorko will return for an office visit in 6 weeks. We are available to see him sooner as needed.  15 minutes were spent with the patient today. The majority of the time was used for counseling and coordination of care.  Betsy Coder, MD  02/03/2017  8:51 AM

## 2017-02-03 NOTE — Telephone Encounter (Signed)
Appointments scheduled per 2/23 LOS. Patient given AVS report and calendars with future scheduled appointments. °

## 2017-02-07 ENCOUNTER — Ambulatory Visit: Payer: PPO | Admitting: Cardiovascular Disease

## 2017-02-08 ENCOUNTER — Telehealth: Payer: Self-pay | Admitting: *Deleted

## 2017-02-08 NOTE — Telephone Encounter (Signed)
Call received from [place] with [facility] stating that patient has had "significant nausea and vomiting that is not controlled with Haldol or Compazine" and would like an order for another antiemetic for patient.  Call placed back to patient to inform him per Dr. Benay Spice to take Ativan 0.5 mg SL every 6 hrs as needed for nausea.  Patient states that he has approximately 20 tablets of Ativan at home and will try those, alternating with Compazine.  Patient instructed to call [place] back if nausea does not improve with use of Ativan.

## 2017-02-19 ENCOUNTER — Emergency Department (HOSPITAL_COMMUNITY)
Admission: EM | Admit: 2017-02-19 | Discharge: 2017-02-19 | Disposition: A | Discharge status: Home / Self Care | Payer: PPO | Attending: Emergency Medicine | Admitting: Emergency Medicine

## 2017-02-19 ENCOUNTER — Encounter (HOSPITAL_COMMUNITY): Payer: Self-pay | Admitting: *Deleted

## 2017-02-19 DIAGNOSIS — Z951 Presence of aortocoronary bypass graft: Secondary | ICD-10-CM | POA: Insufficient documentation

## 2017-02-19 DIAGNOSIS — E119 Type 2 diabetes mellitus without complications: Secondary | ICD-10-CM | POA: Insufficient documentation

## 2017-02-19 DIAGNOSIS — Z87891 Personal history of nicotine dependence: Secondary | ICD-10-CM | POA: Diagnosis not present

## 2017-02-19 DIAGNOSIS — R112 Nausea with vomiting, unspecified: Secondary | ICD-10-CM | POA: Diagnosis not present

## 2017-02-19 DIAGNOSIS — J45909 Unspecified asthma, uncomplicated: Secondary | ICD-10-CM | POA: Insufficient documentation

## 2017-02-19 DIAGNOSIS — I5042 Chronic combined systolic (congestive) and diastolic (congestive) heart failure: Secondary | ICD-10-CM | POA: Insufficient documentation

## 2017-02-19 DIAGNOSIS — R1110 Vomiting, unspecified: Secondary | ICD-10-CM

## 2017-02-19 DIAGNOSIS — I110 Hypertensive heart disease with heart failure: Secondary | ICD-10-CM | POA: Diagnosis not present

## 2017-02-19 DIAGNOSIS — C160 Malignant neoplasm of cardia: Secondary | ICD-10-CM

## 2017-02-19 DIAGNOSIS — E039 Hypothyroidism, unspecified: Secondary | ICD-10-CM | POA: Insufficient documentation

## 2017-02-19 DIAGNOSIS — Z8501 Personal history of malignant neoplasm of esophagus: Secondary | ICD-10-CM | POA: Insufficient documentation

## 2017-02-19 MED ORDER — METOCLOPRAMIDE HCL 10 MG PO TABS: 10.0000 mg | ORAL_TABLET | Freq: Four times a day (QID) | ORAL | 0 refills | Status: AC | PRN

## 2017-02-19 MED ORDER — METOCLOPRAMIDE HCL 5 MG/ML IJ SOLN
10.0000 mg | Freq: Once | INTRAMUSCULAR | Status: AC
Start: 2017-02-19 — End: 2017-02-19
  Administered 2017-02-19: 10 mg via INTRAVENOUS
  Filled 2017-02-19: qty 2

## 2017-02-19 MED ORDER — OXYCODONE HCL 10 MG PO TABS: 10.0000 mg | ORAL_TABLET | ORAL | 0 refills | Status: AC

## 2017-02-19 MED ORDER — SODIUM CHLORIDE 0.9 % IV BOLUS (SEPSIS)
2000.0000 mL | Freq: Once | INTRAVENOUS | Status: AC
  Administered 2017-02-19: 2000 mL via INTRAVENOUS

## 2017-02-19 MED ORDER — HEPARIN SOD (PORK) LOCK FLUSH 100 UNIT/ML IV SOLN
500.0000 [IU] | Freq: Once | INTRAVENOUS | Status: AC
  Administered 2017-02-19: 500 [IU]
  Filled 2017-02-19: qty 5

## 2017-02-19 MED ORDER — HYDROMORPHONE HCL 1 MG/ML IJ SOLN
1.0000 mg | Freq: Once | INTRAMUSCULAR | Status: AC
  Administered 2017-02-19: 1 mg via INTRAVENOUS
  Filled 2017-02-19: qty 1

## 2017-02-19 MED ORDER — MORPHINE SULFATE (PF) 4 MG/ML IV SOLN
6.0000 mg | Freq: Once | INTRAVENOUS | Status: DC
  Filled 2017-02-19: qty 2

## 2017-02-19 NOTE — ED Notes (Signed)
Bed: WA19 Expected date:  Expected time:  Means of arrival:  Comments: 

## 2017-02-19 NOTE — ED Provider Notes (Signed)
[facility] DEPT Provider Note   CSN: [id number] Arrival date & time: 02/19/17  1440     History   Chief Complaint Chief Complaint  Patient presents with  . Emesis  . Abdominal Pain    HPI SHIHAB STATES is a 71 y.o. male.   Abdominal Pain   This is a chronic problem. The current episode started more than 1 week ago. The problem occurs constantly. The problem has been gradually worsening. The pain is located in the generalized abdominal region. The quality of the pain is pressure-like and sharp. The pain is severe. Associated symptoms include nausea and vomiting. Pertinent negatives include anorexia and fever. Nothing aggravates the symptoms. Nothing relieves the symptoms. Past workup does not include CT scan. Past medical history comments: metastatic cancer.    Past Medical History:  Diagnosis Date  . A-fib ([place])    a. 11/09/2015: successful DCCV  . Allergy   . Anemia   . Anxiety   . Asthma    "small touch" (07/29/2013)  . Blood transfusion without reported diagnosis   . CHF (congestive heart failure) ([place])   . CHF (congestive heart failure) ([place])   . Coronary heart disease    a. s/p CABG x 3 (VG->PDA, VG->PL, LIMA->LAD);  b. 2013 Abnl Myoview;  c. 2013 Cath: 3/3 patent grafts, occluded LCX which correlated w/ ischemia on MV->not amenable to PCI->Med Rx.  . GERD (gastroesophageal reflux disease)   . Gout   . H/O hiatal hernia   . History of blood transfusion   . History of pneumonia    "3-4 times; last time ~ 2003" (07/29/2013)  . Hyperlipidemia   . Hyperlipidemia   . Hypertension   . Implantable cardioverter-defibrillator Mdt   . Obesity   . OSA on CPAP    not  using  . PAD (peripheral artery disease) ([place])    Angiography in February of 2014: Moderate left iliac artery stenosis, mild to moderate diffuse right SFA disease. Severe proximal right popliteal artery stenosis with three-vessel runoff below the knee. Status post self-expanding stent placement to the  proximal popliteal artery  . PAD (peripheral artery disease) ([place])   . S/P radiation therapy 08/12/15-09/24/15   Ge  junction 54 Gy  . Shortness of breath dyspnea    "has been sob all my life"  . Sleep apnea   . Stomach cancer ([address]) 07/23/2015   invasive adenocarcinoma ,esophagus ,distal   . SVT (supraventricular tachycardia) ([place])   . Type II diabetes mellitus (HCC)    no longer on insulin  . VT (ventricular tachycardia) [facility])     Patient Active Problem List   Diagnosis Date Noted  . Port catheter in place 10/17/2016  . Anemia due to bone marrow failure ([place])   . Atrial fibrillation ([place]) 03/02/2016  . Anemia due to blood loss   . Peripheral arterial disease ([place]) 02/07/2016  . Chronic respiratory failure with hypoxia and hypercapnia ([place]) 11/22/2015  . Amiodarone pulmonary toxicity (HCC)/ resolved 11/16/2015  . 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 Cardiomyopathy, ischemic-30-35% 11/12/2015  . Malnutrition of moderate degree 11/12/2015  . Constipation 11/12/2015  . Atrial flutter with rapid ventricular response ([place]) 10/22/2015  . Weakness generalized 10/01/2015  . Persistent atrial fibrillation ([place])   . PAF-  09/10/2015  . Anemia of chronic disease 09/10/2015  . Type 2 diabetes mellitus with renal manifestations ([place]) 09/10/2015  . Hypothyroidism 09/10/2015  . Gout 09/10/2015  . 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 Near syncope 06/21/2013  .  Systolic and diastolic CHF, chronic ([place]) 07/01/2011  . Coronary artery disease-status post CABG 06/17/2011  . Automatic implantable cardioverter-defibrillator in situ 06/17/2011  . Hyperlipidemia 12/19/2007    Past Surgical History:  Procedure Laterality Date  . ABDOMINAL AORTAGRAM N/A 01/30/2013   Procedure: ABDOMINAL Maxcine Ham;  Surgeon: Wellington Hampshire, MD;  Location: [facility] CATH LAB;  Service: Cardiovascular;  Laterality: N/A;  . CARDIAC CATHETERIZATION    . CARDIAC CATHETERIZATION N/A 07/22/2015   Procedure: Left Heart  Cath and Coronary Angiography;  Surgeon: Peter M Martinique, MD;  Location: [facility] CV LAB;  Service: Cardiovascular;  Laterality: N/A;  . CARDIAC [place]; ~ 2003  . CARDIOVERSION N/A 11/09/2015   Procedure: CARDIOVERSION;  Surgeon: Pixie Casino, MD;  Location: [facility] ENDOSCOPY;  Service: Cardiovascular;  Laterality: N/A;  . CATARACT EXTRACTION W/ INTRAOCULAR LENS IMPLANT Left   . CORONARY ARTERY BYPASS GRAFT  1997; 2007   "X3" (07/29/2013)  . ESOPHAGOGASTRODUODENOSCOPY N/A 07/23/2015   Procedure: ESOPHAGOGASTRODUODENOSCOPY (EGD);  Surgeon: Wonda Horner, MD;  Location: [facility] ENDOSCOPY;  Service: Endoscopy;  Laterality: N/A;  . ESOPHAGOGASTRODUODENOSCOPY N/A 02/18/2016   Procedure: ESOPHAGOGASTRODUODENOSCOPY (EGD);  Surgeon: Ronald Lobo, MD;  Location: [facility] ENDOSCOPY;  Service: Endoscopy;  Laterality: N/A;  . ESOPHAGOGASTRODUODENOSCOPY (EGD) WITH PROPOFOL N/A 04/05/2016   Procedure: ESOPHAGOGASTRODUODENOSCOPY (EGD) WITH PROPOFOL;  Surgeon: Clarene Essex, MD;  Location: [facility] ENDOSCOPY;  Service: Endoscopy;  Laterality: N/A;  . ESOPHAGOGASTRODUODENOSCOPY (EGD) WITH PROPOFOL N/A 04/28/2016   Procedure: ESOPHAGOGASTRODUODENOSCOPY (EGD) WITH PROPOFOL;  Surgeon: Clarene Essex, MD;  Location: [facility] ENDOSCOPY;  Service: Endoscopy;  Laterality: N/A;  . HOT HEMOSTASIS N/A 04/05/2016   Procedure: HOT HEMOSTASIS (ARGON PLASMA COAGULATION/BICAP);  Surgeon: Clarene Essex, MD;  Location: [facility] ENDOSCOPY;  Service: Endoscopy;  Laterality: N/A;  . HOT HEMOSTASIS N/A 04/28/2016   Procedure: HOT HEMOSTASIS (ARGON PLASMA COAGULATION/BICAP);  Surgeon: Clarene Essex, MD;  Location: [facility] ENDOSCOPY;  Service: Endoscopy;  Laterality: N/A;  . IR GENERIC HISTORICAL  09/02/2016   IR FLUORO GUIDE PORT INSERTION RIGHT 09/02/2016 Arne Cleveland, MD [facility]-INTERV RAD  . IR GENERIC HISTORICAL  09/02/2016   IR US GUIDE VASC ACCESS RIGHT 09/02/2016 Arne Cleveland, MD [facility]INTER[facility] RAD  . NERVE, TENDON AND ARTERY REPAIR Left 09/20/2013   Procedure:  IRRIGATION AND DEBRIDEMENT,Exploration and Repair of Ulnar/Digital  Artery Nerve of Left Index finger;  Surgeon: Roseanne Kaufman, MD;  Location: [facility];  Service: Orthopedics;  Laterality: Left;  . TEE WITHOUT CARDIOVERSION N/A 10/06/2015   Procedure: TRANSESOPHAGEAL ECHOCARDIOGRAM (TEE);  Surgeon: Pixie Casino, MD;  Location: [facility] ENDOSCOPY;  Service: Cardiovascular;  Laterality: N/A;  . TONSILLECTOMY    . WRIST FRACTURE SURGERY Right        Home Medications    Prior to Admission medications   Medication Sig Start Date End Date Taking? Authorizing Provider  antiseptic oral rinse (BIOTENE) LIQD 15 mLs by Mouth Rinse route 5 (five) times daily. 02/16/17  Yes Historical Provider, MD  carvedilol (COREG) 6.25 MG tablet take 1 tablet by mouth twice a day with food 09/12/16  Yes Peter M Martinique, MD  [facility] 125 MCG tablet take 1 tablet by mouth once daily 07/12/16  Yes Peter M Martinique, MD  ferrous sulfate 325 (65 FE) MG EC tablet take 1 tablet by mouth once daily 01/26/17  Yes Peter M Martinique, MD  haloperidol (HALDOL) 5 MG tablet Take 5 mg by mouth daily. 12/28/16  Yes Historical Provider, MD  levothyroxine (SYNTHROID, LEVOTHROID) 75 MCG tablet Take 75 mcg by mouth daily  before breakfast.  12/06/14  Yes Historical Provider, MD  lisinopril (PRINIVIL,ZESTRIL) 5 MG tablet take 1 tablet by mouth once daily 01/26/17  Yes Peter M Martinique, MD  LORazepam (ATIVAN) 0.5 MG tablet take 1 tablet by mouth at bedtime if needed for anxiety or sleep 01/27/17  Yes Ladell Pier, MD  methadone (DOLOPHINE) 10 MG tablet Take 15 mg by mouth every 12 (twelve) hours.  12/14/16  Yes Historical Provider, MD  ondansetron (ZOFRAN-ODT) 4 MG disintegrating tablet Take 4 mg by mouth every 6 (six) hours. 01/24/17  Yes Historical Provider, MD  OSCIMIN 0.125 MG tablet Take 0.125 mg by mouth every 4 (four) hours as needed. 02/15/17  Yes Historical Provider, MD  pantoprazole (PROTONIX) 40 MG tablet Take 40 mg by mouth daily. 01/26/17  Yes Historical  Provider, MD  prochlorperazine (COMPAZINE) 10 MG tablet Take 10 mg by mouth every 6 (six) hours as needed for nausea or vomiting.   Yes Historical Provider, MD  RA SENNA PLUS 8.6-50 MG tablet Take 1 tablet by mouth 2 (two) times daily. 01/11/17  Yes Historical Provider, MD  ranitidine (ZANTAC) 300 MG tablet take 1 tablet by mouth once daily if needed for HEARTBURN 08/25/16  Yes Peter M Martinique, MD  metoCLOPramide (REGLAN) 10 MG tablet Take 1 tablet (10 mg total) by mouth every 6 (six) hours as needed for nausea (nausea/headache). 02/19/17   Merrily Pew, MD  nitroGLYCERIN (NITROSTAT) 0.4 MG SL tablet Place 1 tablet (0.4 mg total) under the tongue every 5 (five) minutes x 3 doses as needed for chest pain. 09/30/16   Ladell Pier, MD  Oxycodone HCl 10 MG TABS Take 1-2 tablets (10-20 mg total) by mouth every 4 (four) hours. 02/19/17   Merrily Pew, MD    Family History Family History  Problem Relation Age of Onset  . Heart disease Brother   . Cancer Brother   . Breast cancer Mother     Social History Social History  Substance Use Topics  . Smoking status: Former Smoker    Packs/day: 2.00    Years: 30.00    Types: Cigarettes    Quit date: 12/13/1995  . Smokeless tobacco: Never Used  . Alcohol use No     Comment: 07/29/2013 "not touched any alcohol since 1997"     Allergies   Adhesive [tape]; Levaquin [levofloxacin in d5w]; and Morphine and related   Review of Systems Review of Systems  Constitutional: Negative for fever.  Gastrointestinal: Positive for abdominal pain, nausea and vomiting. Negative for anorexia.  All other systems reviewed and are negative.    Physical Exam Updated Vital Signs BP 145/83 (BP Location: Left Arm)   Pulse 100   Temp 97.3 F (36.3 C) (Oral)   Resp 21   Ht 6' (1.829 m)   Wt 180 lb (81.6 kg)   SpO2 99%   BMI 24.41 kg/m   Physical Exam  Constitutional: He is oriented to person, place, and time. He appears well-developed and well-nourished.    HENT:  Head: Normocephalic and atraumatic.  Eyes: Pupils are equal, round, and reactive to light. Scleral icterus is present.  Right eye strabismus  Neck: Normal range of motion.  Cardiovascular: Normal rate.   Pulmonary/Chest: Effort normal. No respiratory distress.  Abdominal: Soft. He exhibits distension. There is tenderness.  Musculoskeletal: Normal range of motion.  Neurological: He is alert and oriented to person, place, and time. He displays normal reflexes. No cranial nerve deficit. Coordination normal.  Skin: Skin is warm  and dry.  Nursing note and vitals reviewed.    ED Treatments / Results  Labs (all labs ordered are listed, but only abnormal results are displayed) Labs Reviewed - No data to display  EKG  EKG Interpretation None       Radiology No results found.  Procedures Procedures (including critical care time)  Medications Ordered in ED Medications  sodium chloride 0.9 % bolus 2,000 mL (0 mLs Intravenous Stopped 02/19/17 1755)  metoCLOPramide (REGLAN) injection 10 mg (10 mg Intravenous Given 02/19/17 1613)  HYDROmorphone (DILAUDID) injection 1 mg (1 mg Intravenous Given 02/19/17 1620)  HYDROmorphone (DILAUDID) injection 1 mg (1 mg Intravenous Given 02/19/17 1745)  heparin lock flush 100 unit/mL (500 Units Intracatheter Given 02/19/17 1745)     Initial Impression / Assessment and Plan / ED Course  I have reviewed the triage vital signs and the nursing notes.  Pertinent labs & imaging results that were available during my care of the patient were reviewed by me and considered in my medical decision making (see chart for details).     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 Patient states he's been in pain and nausea for last 3 months but over the last 6-8 weeks is progressively got worse and specifically today it got a lot worse. States that he takes methadone twice a day and oxycodone 10 mg up to 5 times a day as needed  for pain. He states this was working is not working as well and now even with a recent increase in that medication. He states he does not want any testing that we'll reveal something that will not be intervened upon. We discussed CT scan labs to see if there is any worsening of symptoms however in this situation he really just wanted nausea and pain control and did not want any workup. Discussed this with the hospice nurses who stated this was consistent with the wishes that he did express to them and to the power of attorney that was in the room. Labs and CT were canceled and pain/nausea control was addressed. He had significant pain relief with IV Dilaudid and Reglan along with some fluid boluses. Patient much improved with discharge home on increasing his oxycodone and hospice will come to talk to him tomorrow morning for further management of his pain.  Final Clinical Impressions(s) / ED Diagnoses   Final diagnoses:  Vomiting, intractability of vomiting not specified, presence of nausea not specified, unspecified vomiting type    New Prescriptions Discharge Medication List as of 02/19/2017  5:58 PM       Merrily Pew, MD 02/19/17 1947

## 2017-02-19 NOTE — ED Triage Notes (Signed)
Pt states he started to vomit about 3 hours ago and is having increased abd pain

## 2017-03-07 ENCOUNTER — Encounter: Payer: Self-pay | Admitting: *Deleted

## 2017-03-12 DEATH — deceased

## 2017-03-17 ENCOUNTER — Ambulatory Visit: Payer: PPO | Admitting: Oncology
